# Patient Record
Sex: Male | Born: 1940 | Hispanic: No | Marital: Married | Smoking: Former smoker
Health system: Southern US, Community
[De-identification: ages and names within clinical notes are randomized; demographics above are authoritative.]

## PROBLEM LIST (undated history)

## (undated) DIAGNOSIS — I509 Heart failure, unspecified: Secondary | ICD-10-CM

## (undated) DIAGNOSIS — I10 Essential (primary) hypertension: Secondary | ICD-10-CM

## (undated) DIAGNOSIS — I639 Cerebral infarction, unspecified: Secondary | ICD-10-CM

## (undated) DIAGNOSIS — G4730 Sleep apnea, unspecified: Secondary | ICD-10-CM

## (undated) DIAGNOSIS — E785 Hyperlipidemia, unspecified: Secondary | ICD-10-CM

## (undated) DIAGNOSIS — F0390 Unspecified dementia without behavioral disturbance: Secondary | ICD-10-CM

## (undated) DIAGNOSIS — K219 Gastro-esophageal reflux disease without esophagitis: Secondary | ICD-10-CM

## (undated) DIAGNOSIS — J45909 Unspecified asthma, uncomplicated: Secondary | ICD-10-CM

## (undated) DIAGNOSIS — I2510 Atherosclerotic heart disease of native coronary artery without angina pectoris: Secondary | ICD-10-CM

## (undated) DIAGNOSIS — D649 Anemia, unspecified: Secondary | ICD-10-CM

## (undated) DIAGNOSIS — IMO0001 Reserved for inherently not codable concepts without codable children: Secondary | ICD-10-CM

## (undated) DIAGNOSIS — I499 Cardiac arrhythmia, unspecified: Secondary | ICD-10-CM

## (undated) DIAGNOSIS — E119 Type 2 diabetes mellitus without complications: Secondary | ICD-10-CM

## (undated) DIAGNOSIS — J449 Chronic obstructive pulmonary disease, unspecified: Secondary | ICD-10-CM

## (undated) DIAGNOSIS — E039 Hypothyroidism, unspecified: Secondary | ICD-10-CM

## (undated) DIAGNOSIS — E079 Disorder of thyroid, unspecified: Secondary | ICD-10-CM

## (undated) DIAGNOSIS — K922 Gastrointestinal hemorrhage, unspecified: Secondary | ICD-10-CM

## (undated) HISTORY — PX: EYE SURGERY: SHX253

## (undated) HISTORY — DX: Essential (primary) hypertension: I10

## (undated) HISTORY — DX: Unspecified dementia, unspecified severity, without behavioral disturbance, psychotic disturbance, mood disturbance, and anxiety: F03.90

## (undated) HISTORY — DX: Sleep apnea, unspecified: G47.30

## (undated) HISTORY — PX: OTHER SURGICAL HISTORY: SHX169

## (undated) HISTORY — PX: CARDIAC CATHETERIZATION: SHX172

---

## 2005-09-16 ENCOUNTER — Other Ambulatory Visit: Payer: Self-pay

## 2005-09-16 ENCOUNTER — Emergency Department: Payer: Self-pay

## 2007-02-07 ENCOUNTER — Ambulatory Visit: Payer: Self-pay | Admitting: Cardiovascular Disease

## 2008-07-24 ENCOUNTER — Other Ambulatory Visit: Payer: Self-pay | Admitting: Ophthalmology

## 2009-08-12 ENCOUNTER — Emergency Department: Payer: Self-pay | Admitting: Emergency Medicine

## 2010-03-10 DIAGNOSIS — H11009 Unspecified pterygium of unspecified eye: Secondary | ICD-10-CM | POA: Insufficient documentation

## 2010-03-10 DIAGNOSIS — H40059 Ocular hypertension, unspecified eye: Secondary | ICD-10-CM | POA: Insufficient documentation

## 2010-03-10 DIAGNOSIS — M3501 Sicca syndrome with keratoconjunctivitis: Secondary | ICD-10-CM | POA: Insufficient documentation

## 2010-03-10 DIAGNOSIS — Z961 Presence of intraocular lens: Secondary | ICD-10-CM | POA: Insufficient documentation

## 2010-06-07 DIAGNOSIS — H04129 Dry eye syndrome of unspecified lacrimal gland: Secondary | ICD-10-CM | POA: Insufficient documentation

## 2010-08-11 DIAGNOSIS — J310 Chronic rhinitis: Secondary | ICD-10-CM | POA: Insufficient documentation

## 2010-08-11 DIAGNOSIS — Z8739 Personal history of other diseases of the musculoskeletal system and connective tissue: Secondary | ICD-10-CM | POA: Insufficient documentation

## 2010-08-11 DIAGNOSIS — I2510 Atherosclerotic heart disease of native coronary artery without angina pectoris: Secondary | ICD-10-CM | POA: Insufficient documentation

## 2010-08-11 DIAGNOSIS — K603 Anal fistula: Secondary | ICD-10-CM | POA: Insufficient documentation

## 2010-08-11 DIAGNOSIS — K219 Gastro-esophageal reflux disease without esophagitis: Secondary | ICD-10-CM | POA: Insufficient documentation

## 2010-08-11 DIAGNOSIS — E119 Type 2 diabetes mellitus without complications: Secondary | ICD-10-CM

## 2011-12-12 ENCOUNTER — Ambulatory Visit: Payer: Self-pay | Admitting: Internal Medicine

## 2012-02-20 ENCOUNTER — Ambulatory Visit: Payer: Self-pay | Admitting: Gastroenterology

## 2012-03-27 ENCOUNTER — Ambulatory Visit: Payer: Self-pay | Admitting: Cardiovascular Disease

## 2012-07-06 ENCOUNTER — Ambulatory Visit: Payer: Self-pay | Admitting: Internal Medicine

## 2012-07-11 ENCOUNTER — Ambulatory Visit: Payer: Self-pay | Admitting: Internal Medicine

## 2012-09-20 ENCOUNTER — Ambulatory Visit: Payer: Self-pay | Admitting: Otolaryngology

## 2013-09-19 DIAGNOSIS — F17200 Nicotine dependence, unspecified, uncomplicated: Secondary | ICD-10-CM | POA: Insufficient documentation

## 2014-05-06 ENCOUNTER — Inpatient Hospital Stay
Admit: 2014-05-06 | Disposition: A | Discharge status: Home / Self Care | Payer: Self-pay | Attending: Internal Medicine | Admitting: Internal Medicine

## 2014-05-06 LAB — CK TOTAL AND CKMB (NOT AT ARMC)
CK, Total: 645 U/L — ABNORMAL HIGH
CK-MB: 19.9 ng/mL — ABNORMAL HIGH

## 2014-05-06 LAB — PROTIME-INR
INR: 1.4
Prothrombin Time: 17.3 secs — ABNORMAL HIGH

## 2014-05-06 LAB — COMPREHENSIVE METABOLIC PANEL
AST: 59 U/L — AB
Albumin: 4 g/dL
Alkaline Phosphatase: 57 U/L
Anion Gap: 10 (ref 7–16)
BUN: 25 mg/dL — ABNORMAL HIGH
Bilirubin,Total: 0.7 mg/dL
CHLORIDE: 90 mmol/L — AB
CO2: 29 mmol/L
Calcium, Total: 9.2 mg/dL
Creatinine: 1.3 mg/dL — ABNORMAL HIGH
EGFR (African American): >60
GFR CALC NON AF AMER: 53 — AB
Glucose: 67 mg/dL
Potassium: 3.7 mmol/L
SGPT (ALT): 31 U/L
Sodium: 129 mmol/L — ABNORMAL LOW
Total Protein: 7.6 g/dL

## 2014-05-06 LAB — URINALYSIS, COMPLETE
BILIRUBIN, UR: NEGATIVE
Bacteria: NONE SEEN
Glucose,UR: NEGATIVE mg/dL (ref 0–75)
Ketone: NEGATIVE
LEUKOCYTE ESTERASE: NEGATIVE
NITRITE: NEGATIVE
PH: 7 (ref 4.5–8.0)
PROTEIN: NEGATIVE
SQUAMOUS EPITHELIAL: NONE SEEN
Specific Gravity: 1.004 (ref 1.003–1.030)
WBC UR: NONE SEEN /HPF (ref 0–5)

## 2014-05-06 LAB — CBC
HCT: 26.4 % — ABNORMAL LOW (ref 40.0–52.0)
HGB: 8.4 g/dL — ABNORMAL LOW (ref 13.0–18.0)
MCH: 23.3 pg — AB (ref 26.0–34.0)
MCHC: 31.7 g/dL — AB (ref 32.0–36.0)
MCV: 73 fL — ABNORMAL LOW (ref 80–100)
PLATELETS: 236 10*3/uL (ref 150–440)
RBC: 3.6 10*6/uL — AB (ref 4.40–5.90)
RDW: 16.5 % — ABNORMAL HIGH (ref 11.5–14.5)
WBC: 9.9 10*3/uL (ref 3.8–10.6)

## 2014-05-06 LAB — APTT: ACTIVATED PTT: 38.5 s — AB (ref 23.6–35.9)

## 2014-05-06 LAB — AMMONIA: Ammonia, Plasma: 10 umol/L

## 2014-05-06 LAB — FERRITIN: Ferritin (ARMC): 9 ng/mL — ABNORMAL LOW

## 2014-05-06 LAB — PRO B NATRIURETIC PEPTIDE: B-TYPE NATIURETIC PEPTID: 493 pg/mL — AB

## 2014-05-06 LAB — IRON AND TIBC
IRON BIND. CAP.(TOTAL): 509 — AB (ref 250–450)
IRON: 14 ug/dL — AB
Iron Saturation: 2.7
Unbound Iron-Bind.Cap.: 495.5

## 2014-05-06 LAB — TROPONIN I: TROPONIN-I: 0.03 ng/mL

## 2014-05-06 LAB — LACTIC ACID, PLASMA: Lactic Acid, Venous: 1.5 mmol/L

## 2014-05-06 LAB — CK-MB: CK-MB: 16.7 ng/mL — AB

## 2014-05-07 LAB — CBC WITH DIFFERENTIAL/PLATELET
BASOS ABS: 0 10*3/uL (ref 0.0–0.1)
Basophil %: 0.1 %
EOS ABS: 0 10*3/uL (ref 0.0–0.7)
Eosinophil %: 0 %
HCT: 26.2 % — AB (ref 40.0–52.0)
HGB: 8.5 g/dL — ABNORMAL LOW (ref 13.0–18.0)
LYMPHS PCT: 8.2 %
Lymphocyte #: 0.5 10*3/uL — ABNORMAL LOW (ref 1.0–3.6)
MCH: 23.8 pg — ABNORMAL LOW (ref 26.0–34.0)
MCHC: 32.5 g/dL (ref 32.0–36.0)
MCV: 73 fL — ABNORMAL LOW (ref 80–100)
Monocyte #: 0.7 x10 3/mm (ref 0.2–1.0)
Monocyte %: 11.7 %
NEUTROS ABS: 4.5 10*3/uL (ref 1.4–6.5)
Neutrophil %: 80 %
Platelet: 236 10*3/uL (ref 150–440)
RBC: 3.58 10*6/uL — ABNORMAL LOW (ref 4.40–5.90)
RDW: 16.7 % — AB (ref 11.5–14.5)
WBC: 5.6 10*3/uL (ref 3.8–10.6)

## 2014-05-07 LAB — BASIC METABOLIC PANEL
Anion Gap: 10 (ref 7–16)
BUN: 22 mg/dL — ABNORMAL HIGH
CALCIUM: 8.7 mg/dL — AB
Chloride: 92 mmol/L — ABNORMAL LOW
Co2: 27 mmol/L
Creatinine: 1.24 mg/dL
EGFR (African American): >60
GFR CALC NON AF AMER: 57 — AB
GLUCOSE: 131 mg/dL — AB
Potassium: 3.9 mmol/L
SODIUM: 129 mmol/L — AB

## 2014-05-07 LAB — TROPONIN I
Troponin-I: 0.04 ng/mL — ABNORMAL HIGH
Troponin-I: 0.03 ng/mL

## 2014-05-07 LAB — CK-MB: CK-MB: 11.9 ng/mL — AB

## 2014-05-08 LAB — BASIC METABOLIC PANEL
Anion Gap: 10 (ref 7–16)
BUN: 35 mg/dL — ABNORMAL HIGH
CALCIUM: 8.5 mg/dL — AB
CHLORIDE: 90 mmol/L — AB
CO2: 29 mmol/L
CREATININE: 1.6 mg/dL — AB
EGFR (African American): 48 — ABNORMAL LOW
EGFR (Non-African Amer.): 42 — ABNORMAL LOW
Glucose: 95 mg/dL
POTASSIUM: 3.8 mmol/L
Sodium: 129 mmol/L — ABNORMAL LOW

## 2014-05-08 LAB — IRON AND TIBC
IRON BIND. CAP.(TOTAL): 474 — AB (ref 250–450)
Iron Saturation: 3.6
Iron: 17 ug/dL — ABNORMAL LOW
UNBOUND IRON-BIND. CAP.: 457.5

## 2014-05-08 LAB — FERRITIN: Ferritin (ARMC): 8 ng/mL — ABNORMAL LOW

## 2014-05-09 LAB — BASIC METABOLIC PANEL
Anion Gap: 5 — ABNORMAL LOW (ref 7–16)
BUN: 40 mg/dL — AB
CREATININE: 1.68 mg/dL — AB
Calcium, Total: 8.3 mg/dL — ABNORMAL LOW
Chloride: 90 mmol/L — ABNORMAL LOW
Co2: 30 mmol/L
GFR CALC AF AMER: 45 — AB
GFR CALC NON AF AMER: 39 — AB
Glucose: 111 mg/dL — ABNORMAL HIGH
Potassium: 4 mmol/L
Sodium: 125 mmol/L — ABNORMAL LOW

## 2014-05-09 LAB — HEMOGLOBIN: HGB: 8.7 g/dL — ABNORMAL LOW (ref 13.0–18.0)

## 2014-05-10 LAB — BASIC METABOLIC PANEL
ANION GAP: 6 — AB (ref 7–16)
BUN: 39 mg/dL — ABNORMAL HIGH
CALCIUM: 8 mg/dL — AB
CHLORIDE: 92 mmol/L — AB
CO2: 29 mmol/L
Creatinine: 1.56 mg/dL — ABNORMAL HIGH
EGFR (Non-African Amer.): 43 — ABNORMAL LOW
GFR CALC AF AMER: 50 — AB
GLUCOSE: 113 mg/dL — AB
Potassium: 4.2 mmol/L
SODIUM: 127 mmol/L — AB

## 2014-05-11 LAB — BASIC METABOLIC PANEL
Anion Gap: 5 — ABNORMAL LOW (ref 7–16)
BUN: 36 mg/dL — AB
CO2: 31 mmol/L
Calcium, Total: 8 mg/dL — ABNORMAL LOW
Chloride: 96 mmol/L — ABNORMAL LOW
GLUCOSE: 125 mg/dL — AB
Potassium: 3.8 mmol/L
Sodium: 132 mmol/L — ABNORMAL LOW

## 2014-05-11 LAB — HEMOGLOBIN: HGB: 8.5 g/dL — ABNORMAL LOW (ref 13.0–18.0)

## 2014-05-11 LAB — CULTURE, BLOOD (SINGLE)

## 2014-05-11 LAB — CREATININE, SERUM
Creatinine: 1.39 mg/dL — ABNORMAL HIGH
EGFR (Non-African Amer.): 49 — ABNORMAL LOW
GFR CALC AF AMER: 57 — AB

## 2014-05-13 ENCOUNTER — Ambulatory Visit: Admit: 2014-05-13 | Disposition: A | Discharge status: Home / Self Care | Payer: Self-pay | Admitting: Internal Medicine

## 2014-05-25 NOTE — Discharge Summary (Addendum)
PATIENT NAME:  Derek Shelton, SCHNORR MR#:  [id number] DATE OF BIRTH:  Jun 06, 1940  DATE OF ADMISSION:  05/06/2014 DATE OF DISCHARGE:  05/12/2014  PRIMARY CARE PHYSICIAN:  Lamonte Sakai, MD   DISCHARGE DIAGNOSES:   1.  Altered mental status secondary to metabolic encephalopathy.  2.  Pneumonia. 3.  Myoclonic jerks secondary to inadequate sleep.  4.  Obstructive sleep apnea.  5.  Type 2 diabetes mellitus.  6.  Constipation.  7.  Acute respiratory failure due to pneumonia.  8.  Acute on chronic diastolic heart failure.  9.  Hyponatremia due to congestive heart failure.  10.  Acute renal failure secondary to acute tubular necrosis, resolved.  11. Paroxysmal atrial fibrillation.   DISCHARGE MEDICATIONS:  1.  Avodart 0.5 mg p.o. daily. 2.  Cymbalta 60 mg p.o. daily. 3.  Eliquis 5 mg p.o. b.i.d. and then change to 2.5 mg b.i.d.  4.  Amaryl 2 mg p.o. daily. 5.  Lovaza 1 gram 2 capsules p.o. b.i.d.    6.  MiraLax as needed for constipation.  7.  Nexium 40 mg p.o. daily.  8.  Levothyroxine 50 mcg p.o. daily.   9.  Meloxicam 15 mg p.o. daily.  10. Amiodarone 200 mg p.o. daily.  11.  Lasix 20 mg p.o. daily  12.  Coreg 3.125 mg p.o. daily.  13.  Hydralazine 100 mg every 8 hours.  14.  Benazepril 10 mg p.o. daily.  15.  Trazodone 100 mg p.o. at bedtime.  16.  Symbicort 5/200 mcg 2 puffs b.i.d. He was given [facility] but that was not approved so we gave Symbicort instead.  18.  Prednisone 20 mg  2 tablets p.o. b.i.d. and 1 tablet p.o. b.i.d. and then stop.  19.  Melatonin 3 mg p.o. at bedtime.  20.  Xanax 0.5 mg p.o. at bedtime as needed for insomnia.   21.  Spiriva 18 mcg inhalation daily.    Advised to stop metformin until he gets followup with his primary doctor regarding his kidney function. We also stopped Benadryl because of episodes of confusion and jerks.   CONSULTATIONS:  1.  Cardiology consult with Dionisio David, MD.   2.  Urology consult with Mila Homer. Tamala Julian, MD. 3.  Pulmonary  consult with Mariane Duval, [place]:   1.  This is a 74 year old male patient, followed by Dr. Lamonte Sakai, brought in because of altered mental status  with .  The patient has been having symptoms of shortness of breath, pedal edema, and confusion for about 3 to 4 weeks. The patient went to see Dr. Lamonte Sakai where he was found to be confused with myoclonic jerks, so the patient was sent into the Emergency Room for further evaluation. The patient was found to have a left upper lobe pneumonia and hyponatremia with sodium 129 and creatinine of 1.3 on admission. The patient had an EKG with atrial fibrillation with controlled rate of 60 beats per minute. Admitted to the hospitalist service for altered mental status secondary to infection and also possible poor sleep. The patient was started on Levaquin and vancomycin.  His CT head did not show any acute changes. The patient was initially admitted to the ICU, started on BiPAP, then changed from BiPAP to oxygen via nasal cannula. The patient's blood cultures did not show any growth. The patient finished a course of 7 days of antibiotics, so he did not qualify for any further doses. The patient's main problem is he has sleep  apnea and he has a BMI of 34.3. He was given sleep apnea machine by Dr. Lamonte Sakai, but he returned it to the company because he could not tolerate it and he was not wearing CPAP machine like the way he was supposed to and he has been having trouble sleeping for like months and had this confusional episode, so Dr. Valora Corporal saw the patient and he recommended starting him on melatonin and also some trazodone and he did not think he needs any workup.  He suggested he needs CPAP machine and treatment for sleep apnea that can take care of his (myoklonic jerks.  The patient does not have CPAP machine at home and the patient was discharged with CPAP the following day and we scheduled sleep studies the next 2 days so that he can be  tested for CPAP.  Because the patient did sleep studies in the last 1 year for him to qualify for CPAP at home. The patient was given a trial of CPAP machine at night.  He tolerated it very well and he was mainly alert and oriented the following day.  The patient's confusion also resolved.   2.  Acute on chronic diastolic heart failure.  The patient's ejection fraction more than 60% and he thought to have congestive heart flare with hyponatremia.  He was given IV Lasix and his sodium nicely improved and the patient was seen by Dr. Neoma Laming. The patient's sodium improved and it was as low as 125 and with IV Lasix it improved to 132.  The patient was discharged home with p.o. Lasix.  3.  Proximal atrial fibrillation.  He is rate controlled.  He is on amiodarone and just a small dose of Coreg and Eliquis.    4.  Chronic obstructive pulmonary disease.  He chews tobacco and he has been given prescription for a tapering course of prednisone and he is on Symbicort.  We encourage him to continue and advised him to quit chewing tobacco.  5.  Diabetes mellitus type 2.  We gave him Amaryl, but metformin was stopped because of renal failure.  The patient's son is a physician, d/w him.so we advised him that metformin can be restarted after he gets his labs done by Dr. Lamonte Sakai.   6.  Hypothyroidism.  Continue on Synthroid at 50 mcg p.o. daily.   7.  Anxiety.  Patient did well with small dose of Xanax 0.5 mg at bedtime.  (gave prescription.> both the patient and patient's son but he needs  regular monitoring of this and that can be done with his primary doctor. 8.   Regarding acute on chronic diastolic failure, he is on aspirin, beta blockers, ACE inhibitors and Lasix.  9.  The main problem in his obesity and also sleep apnea.  We could not discharge him until the CPAP is set up, so the patient finally got CPAP authorized, so will deliver the CPAP machine to home, but wants home health and the patient will be  discharged home.  The patient had a followup appointment at the sleep center on April 19, sleep study to see if he can be fitted for CPAP machine.     DISCHARGE VITAL SIGNS:  Temperature 98 degrees Fahrenheit, heart rate 52, blood pressure 122/60, 1 tablet 97% on room air.   PHYSICAL EXAMINATION:  CARDIOVASCULAR:  S1 and S2 regular.  LUNGS:  Clear to auscultation.  ABDOMEN:  Soft, nontender, nondistended.  Bowel sounds present.   NEUROLOGICAL:  The  patient was oriented to time, place, person.  We discussed this whole plan. Told us he will stay with his son who is a Careers information officer and also understand that he stays in the town.  The patient's family understands all of the diagnoses and all the treatment plans.  TIME SPENT:  More than 30 minutes.    ____________________________ Epifanio Lesches, MD [id number] D: 05/17/2014 08:14:00 ET T: 05/17/2014 15:08:11 ET JOB#: [id number]  cc: Dionisio David, MD Perrin Maltese, MD      Epifanio Lesches MD ELECTRONICALLY SIGNED 05/27/2014 13:08

## 2014-05-25 NOTE — Discharge Summary (Signed)
PATIENT NAME:  Derek Shelton, Derek Shelton MR#:  [id number] DATE OF BIRTH:  [birth year]  DATE OF ADMISSION:  05/13/2014 DATE OF DISCHARGE:  05/13/2014  PRIMARY CARE PHYSICIAN:  Lamonte Sakai, MD   DISCHARGE DIAGNOSES:   1.  Altered mental status secondary to metabolic encephalopathy.  2.  Pneumonia. 3.  (Dictation Anomaly) <<MISSING TEXT>>  secondary to (Dictation Anomaly) <<MISSING TEXT>> .  4.  Obstructive sleep apnea.  5.  Type 2 diabetes mellitus.  6.  Constipation.  7.  Acute respiratory failure due to pneumonia.  8.  Acute on chronic diastolic heart failure.  9.  Hyponatremia due to congestive heart failure.  10.  Acute renal failure secondary to acute tubular necrosis, resolved.  11. Paroxysmal atrial fibrillation.   DISCHARGE MEDICATIONS:  1.  Avodart 0.5 mg p.o. daily. 2.  Cymbalta 60 mg p.o. daily. 3.  Eliquis 5 mg p.o. b.i.d. and then change to 2.5 mg b.i.d.  4.  Amaryl 2 mg p.o. daily. 5.  Lovaza 1 gram 2 capsules p.o. b.i.d.    6.  MiraLax as needed for constipation.  7.  Nexium 40 mg p.o. daily.  8.  Levothyroxine 50 mcg p.o. daily.   9.  (Dictation Anomaly) <<MISSING TEXT>> p.o. daily.  10. Amiodarone 200 mg p.o. daily.  11.  Lasix 20 mg p.o. daily  12.  Coreg 3.125 mg p.o. daily.  13.  Hydralazine 100 mg every 8 hours.  14.  Benazepril 10 mg p.o. daily.  15.  Trazodone (Dictation Anomaly)<<MISSING TEXT>>  at bedtime.  16.  Symbicort 5/200 mcg 2 puffs b.i.d. He was given [facility] but that was not approved so we gave Symbicort instead.  18.  Prednisone 20 mg  2 tablets p.o. b.i.d. and 1 tablet p.o. b.i.d. and then stop.  19.  Melatonin 3 mg p.o. at bedtime.  20.  Xanax 0.5 mg p.o. at bedtime as needed for insomnia.   21.  Spiriva 18 mcg inhalation daily.    Advised to stop metformin until he gets followup with his primary doctor regarding his kidney function. We also stopped Benadryl because of episodes of confusion and jerks.   CONSULTATIONS:  1.  Cardiology consult with  Dionisio David, MD.   2.  Urology consult with Mila Homer. Tamala Julian, MD. 3.  Pulmonary consult with Mariane Duval, [place]:   1.  This is a 74 year old male patient, followed by Dr. Lamonte Sakai, brought in because of altered mental status (Dictation Anomaly) <<MISSING TEXT>>.  The patient has been having symptoms of shortness of breath, pedal edema, and confusion for about 3 to 4 weeks. The patient went to see Dr. Lamonte Sakai where he was found to be confused with myoclonic jerks, so the patient was sent into the Emergency Room for further evaluation. The patient was found to have a left upper lobe pneumonia and hyponatremia with sodium 129 and creatinine of 1.3 on admission. The patient had an EKG with atrial fibrillation with controlled rate of 60 beats per minute. Admitted to the hospitalist service for altered mental status secondary to infection and also possible poor sleep. The patient was started on Levaquin and vancomycin.  His CT head did not show any acute changes. The patient was initially admitted to the ICU, started on BiPAP, then changed from BiPAP to oxygen via nasal cannula. The patient's blood cultures did not show any growth. The patient finished a course of 7 days of antibiotics, so he did not qualify for any further  doses. The patients main problem is he has sleep apnea and he has a BMI of 34.3. He was given sleep apnea machine by Dr. Lamonte Sakai, but he returned it to the company because he could not tolerate it and he was not wearing CPAP machine like the way he was supposed to and he has been having trouble sleeping for like months and had this confusional episode, so Dr. (Dictation Anomaly)<<MISSING TEXT>> saw the patient and he recommended starting him on melatonin and also some trazodone and he did not think he needs any workup.  He suggested he needs CPAP machine and treatment for sleep apnea that can take care of his (Dictation Anomaly)<<MISSING TEXT>>.  The patient does  not have CPAP machine at home and the patient was discharged with CPAP the following day and we scheduled sleep studies the next 2 days so that he can be tested for CPAP.  Because the patient did sleep studies in the last 1 year for him to qualify for CPAP at home. The patient was given a trial of CPAP machine at night.  He tolerated it very well and he was mainly alert and oriented the following day.  The patient's confusion also resolved.   2.  Acute on chronic diastolic heart failure.  The patients ejection fraction more than 60% and he thought to have congestive heart flare with hyponatremia.  He was given IV Lasix and his sodium nicely improved and the patient was seen by Dr. Neoma Laming. The patient's sodium improved and it was as low as 125 and with IV Lasix it improved to 132.  The patient was discharged home with p.o. Lasix.  3.  Proximal atrial fibrillation.  He is rate controlled.  He is on amiodarone and just a small dose of Coreg and Eliquis.    4.  Chronic obstructive pulmonary disease.  He chews tobacco and he has been given prescription for a tapering course of prednisone and he is on Symbicort.  We encourage him to continue and advised him to quit chewing tobacco.  5.  Diabetes mellitus type 2.  We gave him Amaryl, but metformin was stopped because of renal failure.  The patients son is a physician, (Dictation Anomaly)<<MISSING TEXT>>  physician, so we advised him that metformin can be restarted after he gets his labs done by Dr. Lamonte Sakai.   6.  Hypothyroidism.  Continue on Synthroid at 50 mcg p.o. daily.   7.  Anxiety.  Patient did well with small dose of Xanax 0.5 mg at bedtime.  (Dictation Anomaly)<<MISSING TEXT>> both the patient and patient's son but he missed regular monitoring of this and that can be done with his primary doctor. 8.   Regarding acute on chronic diastolic failure, he is on aspirin, beta blockers, ACE inhibitors and Lasix.  9.  The main problem in his obesity and  also sleep apnea.  We could not discharge him until the CPAP is set up, so the patient finally got CPAP authorized, so will deliver the CPAP machine to home, but wants home health and the patient will be discharged home.  The patient had a followup appointment at the sleep center on April 19, sleep study to see if he can be fitted for CPAP machine.     DISCHARGE VITAL SIGNS:  Temperature 98 degrees Fahrenheit, heart rate 52, blood pressure 122/60, 1 tablet 97% on room air.   PHYSICAL EXAMINATION:  CARDIOVASCULAR:  S1 and S2 regular.  LUNGS:  Clear to auscultation.  ABDOMEN:  Soft, nontender, nondistended.  Bowel sounds present.   NEUROLOGICAL:  The patient was oriented to time, place, person.  We discussed this whole plan. Told us he will stay with his son who is a Careers information officer and also understand that he stays in the town.  The patients family understands all of the diagnoses and all the treatment plans.  TIME SPENT:  More than 30 minutes.     ____________________________ Epifanio Lesches, MD [id number] D: 05/17/2014 08:14:00 ET T: 05/17/2014 15:08:11 ET JOB#: [id number]  cc: Dionisio David, MD Perrin Maltese, MD Epifanio Lesches, MD, <Dictator>

## 2014-05-25 NOTE — Consult Note (Signed)
PATIENT NAME:  Derek, Shelton MR#:  [id number] DATE OF BIRTH:  07/06/1940  DATE OF CONSULTATION:  05/07/2014  REFERRING PHYSICIAN:   CONSULTING PHYSICIAN:  Kelby Fam. Myanna Ziesmer, PA-C  INDICATION FOR CONSULT:  Shortness of breath and congestive heart failure.   HISTORY OF PRESENT ILLNESS: This is a 74 year old male, well known to our practice with a past medical history obstructive sleep apnea, atrial fibrillation, diabetes, hypertension who was seen yesterday with patient's PCP, Dr. Lamonte Sakai, secondary to worsening shortness of breath and the patient being unable to sleep. The patient was seen by cardiology last week for same reasons, shortness of breath and difficulty sleeping, and the patient's family strongly thought that these side effects started when carvedilol was added on for blood pressure control. The patient has no history of systolic CHF but does have history of grade 2 diastolic CHF. The patient has been counseled many times of importance of wearing  CPAP machine and continues to refuse.   PAST MEDICAL HISTORY: Obstructive sleep apnea, atrial fibrillation, hypertension, and diabetes mellitus.  ALLERGIES: PENICILLIN.    CURRENT HOME MEDICATIONS: Amiodarone 200 mg daily, Eliquis 5 mg b.i.d., benazepril 40 mg daily, Lasix  20 mg daily, hydralazine 100 mg b.i.d., levothyroxine 50 mcg daily, Nexium 40 mg daily.   SOCIAL HISTORY: No alcohol abuse. No history of smoking cigarettes.   FAMILY HISTORY: Diabetes.   REVIEW OF SYSTEMS: CONSTITUTIONAL: Patient feeling much better. No fatigue or malaise.  RESPIRATORY: Shortness of breath improved, wheezing improved.  CARDIOVASCULAR: No chest pain, pressure, tightness or palpitations.  GASTROINTESTINAL: No abdominal pain, nausea, vomiting, or diarrhea.  PHYSICAL EXAMINATION:  VITAL SIGNS: Temperature 98.9, pulse 54, blood pressure 122/56, pulse oximetry 98% saturation on 2 liters.   PERTINENT LABORATORY DATA:  BNP is 493. Creatinine 1.3,  1.24.  Sodium 129.   Troponin 0.03, 0.04, 0.03. WBC 9.9, 5.6. EKG shows normal sinus rhythm, 60 beats per minute, 1st degree AV block, right bundle branch block. Echo done in office 03/30/2014 shows EF of 37%, grade 2 diastolic dysfunction, mild to moderate MR and mild AR.   ASSESSMENT AND PLAN: 1. Acute on chronic diastolic congestive heart failure: Likely exacerbated by patient's concurrent pneumonia. Agree with continuing antibiotics. We will check echocardiogram for most recent left ventricular ejection fraction and wall motion. Also advised increasing hydralazine to 100 mg t.i.d. for tighter blood pressure control.  2. Atrial fibrillation. Advised continuation of amiodarone and Eliquis. The patient will likely stay in sinus rhythm as long as he is wearing CPAP.  Importance of wearing CPAP emphasized to patient and patient's wife. He will need this machine at discharge as he sent his CPAP machine from home back to the CPAP machine  company a long time ago. We will continue to follow.   Thank you very much for this consult    ____________________________ Kelby Fam. [place] ear:tr D: 05/07/2014 13:27:38 ET T: 05/07/2014 13:54:06 ET JOB#: [id number]  cc: Dyann Ruddle A. [place], <Dictator> Kelby Fam Kashif Pooler PA ELECTRONICALLY SIGNED 05/23/2014 15:02

## 2014-05-25 NOTE — Consult Note (Signed)
Referring Physician:  Gladstone Lighter :   Primary Care Physician:  Tammy S[facility] [place], [facility], [address] [place],  [address], [phone]  Reason for Consult: Admit Date: 07-May-2014  Chief Complaint: insomnia  Reason for Consult: insomnia   History of Present Illness: History of Present Illness:   seen at request of Dr. Humphrey Rolls for insomnia;  74 yo RHD M presents to [facility] secondary to continued insomnia and diffuse pains all over.  There was mention of some pain all over but pt notes only eye and throat pain.  There were apparently some jerks but pt denies this as does his son in the room.    ROS:  General fatigue   HEENT no complaints   Lungs cough   Cardiac no complaints   GI no complaints   GU no complaints   Musculoskeletal no complaints   Extremities no complaints   Skin no complaints   Neuro headache   Past Medical/Surgical Hx:  Sleep Apnea:   atrial fib:   arthritis:   diabetic:   hypertension:   Past Medical/ Surgical Hx:  Past Medical History reviewed as above   Past Surgical History reviewed as above   Home Medications: Medication Instructions Last Modified Date/Time  Avodart 0.5 mg oral capsule 1 cap(s) orally once a day 12-Apr-16 16:07  Cymbalta 60 mg oral delayed release capsule 1 cap(s) orally once a day 12-Apr-16 16:07  Eliquis 5 mg oral tablet 1 tab(s) orally 2 times a day 12-Apr-16 16:07  glimepiride 2 mg oral tablet 1 tab(s) orally once a day 12-Apr-16 16:07  Lovaza ethyl esters 1000 mg oral capsule 2 cap(s) orally 2 times a day 12-Apr-16 16:07  metformin 1000 mg oral tablet 1 tab(s) orally 2 times a day 12-Apr-16 16:07  MiraLax - oral powder for reconstitution  orally once a day 12-Apr-16 16:05  Nexium 40 mg oral delayed release capsule 1 cap(s) orally once a day 12-Apr-16 16:07  amiodarone 200 mg oral tablet 1 tab(s) orally once a day 12-Apr-16 16:07  hydrALAZINE 100 mg  oral tablet 1 tab(s) orally 2 times a day 12-Apr-16 16:07  furosemide 20 mg oral tablet 1 tab(s) orally once a day 12-Apr-16 16:07  levothyroxine 50 mcg (0.05 mg) oral tablet 1 tab(s) orally once a day 12-Apr-16 16:07  MethylPREDNISolone Dose Pack 4 mg oral tablet  orally  12-Apr-16 16:04  Benadryl 25 mg oral capsule 1 cap(s) orally 3 times a day 12-Apr-16 16:07  benazepril 40 mg oral tablet 1 tab(s) orally once a day 12-Apr-16 16:07  meloxicam 15 mg oral tablet 1 tab(s) orally once a day 12-Apr-16 16:07  carvedilol 6.25 mg oral tablet 1 tab(s) orally 2 times a day 12-Apr-16 16:07   Allergies:  PCN: Unknown  Allergies:  Allergies PCN   Social/Family History: Employment Status: unemployed  Lives With: children  Living Arrangements: house  Social History: no tob, no EtOH, no illicits  Family History: no seizures, no stroke   Vital Signs: **Vital Signs.:   15-Apr-16 06:02  Vital Signs Type Routine  Temperature Temperature (F) 97.5  Celsius 36.3  Pulse Pulse 57  Respirations Respirations 26  Systolic BP Systolic BP 427  Diastolic BP (mmHg) Diastolic BP (mmHg) 75  Mean BP 106  Pulse Ox % Pulse Ox % 98  Pulse Ox Activity Level  At rest  Oxygen Delivery Room Air/ 21 %   Physical Exam: General: overweight, NAD  HEENT: normocephalic, sclera nonicteric, oropharynx clear  Neck: supple, no JVD, no bruits  Chest: moderate wheezing, cough, good movement  Cardiac: RRR, no murmurs, no edema, 2+ pulses  Extremities: no C/C/E, FROM   Neurologic Exam: Mental Status: sleepy but oriented x 3, nl speech and language  Cranial Nerves: PERRLA, EOMI, nl VF, face symmetric, tongue midline, shoulder shrug equal  Motor Exam: 5/5 B normal, tone, mild myoclonus  Deep Tendon Reflexes: 1+/4 B, plantars downgoing B, no Hoffman  Sensory Exam: pinprick, temperature, and vibration intact B   Lab Results: LabObservation:  13-Apr-16 18:05   OBSERVATION Reason for Test  Hepatic:  12-Apr-16 14:10    Bilirubin, Total 0.7 (0.3-1.2 NOTE: New Reference Range  04/01/14)  Alkaline Phosphatase 57 (38-126 NOTE: New Reference Range  04/01/14)  SGPT (ALT) 31 (17-63 NOTE: New Reference Range  04/01/14)  SGOT (AST)  59 (15-41 NOTE: New Reference Range  04/01/14)  Total Protein, Serum 7.6 (6.5-8.1 NOTE: New Reference Range  04/01/14)  Albumin, Serum 4.0 (3.5-5.0 NOTE: New reference range  04/01/14)  Routine Micro:  12-Apr-16 14:35   Micro Text Report BLOOD CULTURE   COMMENT                   NO GROWTH IN 48 HOURS   ANTIBIOTIC                       Culture Comment NO GROWTH IN 48 HOURS  Result(s) reported on 08 May 2014 at 02:00PM.  Routine Chem:  12-Apr-16 14:10   B-Type Natriuretic Peptide ([facility])  493 (0-99 NOTE: New Reference Range:  04/01/14)  Lactic Acid  Venous 1.5 (0.5-2.0 NOTE: New Reference Range:  04/01/14)    15:00   Ammonia, Plasma 10 (9-35 NOTE: New Reference Range  04/01/14)    22:20   Result Comment - TROPONIN CALLED TO  - CARLA QUINCER @ 0104  - 05-07-14 BY AJO  - READ-BACK PERFORMED  Result(s) reported on 07 May 2014 at 01:09AM.  14-Apr-16 05:18   Iron Binding Capacity (TIBC) 474  Unbound Iron Binding Capacity 457.5  Iron, Serum  17 (45-182 NOTE: New Reference Range:  04/01/14)  Iron Saturation 3.6 (Result(s) reported on 08 May 2014 at 12:44PM.)  BUN  35 (6-20 NOTE: New Reference Range  04/01/14)  Creatinine (comp)  1.60 (0.61-1.24 NOTE: New Reference Range  04/01/14)  eGFR (African American)  48  eGFR (Non-African American)  42 (eGFR values <68m/min/1.73 m2 may be an indication of chronic kidney disease (CKD). Calculated eGFR is useful in patients with stable renal function. The eGFR calculation will not be reliable in acutely ill patients when serum creatinine is changing rapidly. It is not useful in patients on dialysis. The eGFR calculation may not be applicable to patients at the low and high extremes of body sizes, pregnant women, and  vegetarians.)  15-Apr-16 04:46   Glucose, Serum  111 (65-99 NOTE: New Reference Range  04/01/14)  Sodium, Serum  125 (135-145 NOTE: New Reference Range  04/01/14)  Potassium, Serum 4.0 (3.5-5.1 NOTE: New Reference Range  04/01/14)  Chloride, Serum  90 (101-111 NOTE: New Reference Range  04/01/14)  CO2, Serum 30 (22-32 NOTE: New Reference Range  04/01/14)  Calcium (Total), Serum  8.3 (8.9-10.3 NOTE: New Reference Range  04/01/14)  Anion Gap  5 (Result(s) reported on 09 May 2014 at 0[facility])  Cardiac:  12-Apr-16 14:10   CK, Total  645 (49-397 NOTE: New Reference Range  04/01/14)  13-Apr-16 01:55   Troponin I 0.03 (  0.00-0.03 0.03 ng/mL or less: NEGATIVE  Repeat testing in 3-6 hrs  if clinically indicated. >0.05 ng/mL: POTENTIAL  MYOCARDIAL INJURY. Repeat  testing in 3-6 hrs if  clinically indicated. NOTE: An increase or decrease  of 30% or more on serial  testing suggests a  clinically important change NOTE: New Reference Range  04/01/14)    06:11   CPK-MB, Serum  11.9 (0.5-5.0 NOTE: New Reference Range  04/01/14)  Routine UA:  12-Apr-16 21:00   Color (UA) Straw  Clarity (UA) Clear  Glucose (UA) Negative  Bilirubin (UA) Negative  Ketones (UA) Negative  Specific Gravity (UA) 1.004  Blood (UA) 1+  pH (UA) 7.0  Protein (UA) Negative  Nitrite (UA) Negative  Leukocyte Esterase (UA) Negative (Result(s) reported on 06 May 2014 at 09:35PM.)  RBC (UA) 0-5  WBC (UA) NONE SEEN  Bacteria (UA) NONE SEEN  Epithelial Cells (UA) NONE SEEN  Result(s) reported on 06 May 2014 at 09:35PM.  Routine Coag:  12-Apr-16 14:10   Prothrombin  17.3 (11.4-15.0 NOTE: New Reference Range  02/21/14)  INR 1.4 (INR reference interval applies to patients on anticoagulant therapy. A single INR therapeutic range for coumarins is not optimal for all indications; however, the suggested range for most indications is 2.0 - 3.0. Exceptions to the INR Reference Range may include: Prosthetic  heart valves, acute myocardial infarction, prevention of myocardial infarction, and combinations of aspirin and anticoagulant. The need for a higher or lower target INR must be assessed individually. Reference: The Pharmacology and Management of the Vitamin K  antagonists: the seventh ACCP Conference on Antithrombotic and Thrombolytic Therapy. TKPTW.6568 Sept:126 (3suppl): [id number]. A HCT value >55% may artifactually increase the PT.  In one study,  the increase was an average of 25%. Reference:  "Effect on Routine and Special Coagulation Testing Values of Citrate Anticoagulant Adjustment in Patients with High HCT Values." American Journal of Clinical Pathology 2006;126:400-405.)  Activated PTT (APTT)  38.5 (A HCT value >55% may artifactually increase the APTT. In one study, the increase was an average of 19%. Reference: "Effect on Routine and Special Coagulation Testing Values of Citrate Anticoagulant Adjustment in Patients with High HCT Values." American Journal of Clinical Pathology 2006;126:400-405.)  Routine Hem:  13-Apr-16 06:11   WBC (CBC) 5.6  RBC (CBC)  3.58  Hematocrit (CBC)  26.2  Platelet Count (CBC) 236  MCV  73  MCH  23.8  MCHC 32.5  RDW  16.7  Neutrophil % 80.0  Lymphocyte % 8.2  Monocyte % 11.7  Eosinophil % 0.0  Basophil % 0.1  Neutrophil # 4.5  Lymphocyte #  0.5  Monocyte # 0.7  Eosinophil # 0.0  Basophil # 0.0 (Result(s) reported on 07 May 2014 at 06:33AM.)  15-Apr-16 04:46   Hemoglobin (CBC)  8.7 (Result(s) reported on 09 May 2014 at 05:40AM.)   Impression/Recommendations: Recommendations:   prior notes reviewed by me reviewed by me   Insomnia-  most likely due to OSA but could have a little anxiety as well;  this by itself can cause confusion after having a lack of sleep for 3 days Encephalopathy-  likely due to 1. but could have some hypercapnea as well start melatonin 3gm PO qHS if above is not effective first night, add Trazadone 19m qHS needs  CPAP and official testing will sign off, please call with questions   Electronic Signatures: SJamison Neighbor(MD)  (Signed 15-Apr-16 08:05)  Authored: REFERRING PHYSICIAN, Primary Care Physician, Consult, History of Present Illness, Review of  Systems, PAST MEDICAL/SURGICAL HISTORY, HOME MEDICATIONS, ALLERGIES, Social/Family History, NURSING VITAL SIGNS, Physical Exam-, LAB RESULTS, Recommendations   Last Updated: 15-Apr-16 08:05 by Jamison Neighbor (MD)

## 2014-05-25 NOTE — H&P (Signed)
PATIENT NAME:  Derek Shelton, Derek Shelton MR#:  [id number] DATE OF BIRTH:  1941-01-14  DATE OF ADMISSION:  05/06/2014  ADMITTING PHYSICIAN:  Dr. Gladstone Lighter, MD.   PRIMARY PHYSICIAN:  Dr. Lamonte Sakai.   PRIMARY CARDIOLOGIST: Dr. Neoma Laming.   CHIEF COMPLAINT:  Altered mental status and myoclonic jerks.   HISTORY OF PRESENT ILLNESS: Derek Shelton is a 74 year old Asian male with past medical history significant for obstructive sleep apnea unable to tolerate CPAP at home, history of atrial fibrillation, osteoarthritis, diabetes, hypertension, was brought in from PCP's office secondary to confusion, difficulty breathing, and worsening pedal edema going on for a few days now. Most of the history is obtained from the patient's younger son and his wife who are at bedside. According to them, who live with the patient, the patient has been having trouble breathing for almost 3-4 weeks now. They feel like he was started on a medication carvedilol and since then they have noticed that he has been congested, wheezing extremely, and was having difficulty breathing. He also had orthopnea that he was unable to lay flat. During the daytime he continued to be sleepy and tired. But over the last couple of days his congestion and wheezing have gotten much worse and the patient has been confused and has had significant myoclonic jerks at home. He went to see his PCP with this presentation, she has sent him to the Emergency Room. In the ER the patient remains confused, but he is alert, he is on a BiPAP now, family feels like his jerks are much improved since he has been on the BiPAP. The patient has not been on any home oxygen. He was also started recently on prednisone taper and albuterol which he is still continuing to take.  His blood pressure is very elevated with systolic greater than 546. The patient does not have a known diagnosis of COPD or asthma. No fevers or chills. According to the family he has been having some dry  cough at home.   PAST MEDICAL HISTORY:  1. Non-insulin-dependent diabetes mellitus.  2. Hypertension.  3. Atrial fibrillation.  4. Obstructive sleep apnea not on CPAP.  5. Osteoarthritis.   PAST SURGICAL HISTORY:  1. Cardiac catheterization.  2. Rectal fistula repair.  3. Right eye growth removal.   ALLERGIES TO MEDICATIONS: PENICILLIN.   CURRENT HOME MEDICATIONS:  1. Amiodarone 200 mg p.o. daily.  2. Avodart 0.5 mg p.o. daily.  3. Benadryl 25 mg p.o. 3 times a day.  4. Benazepril 40 mg p.o. daily.  5. Carvedilol 6.25 mg twice a day.  6. Cymbalta 60 mg p.o. daily.  7. Eliquis 5 mg p.o. b.i.d.  8. Lasix 20 mg p.o. daily.  9. Glimepiride 2 mg p.o. daily.  10. Hydralazine 100 mg p.o. b.i.d.  11. Levothyroxine 50 mcg p.o. daily.  12. Lovaza 2 capsules twice a day.  13. Meloxicam 15 mg p.o. once a day.  14. Metformin 1000 mg p.o. b.i.d.  15. Methylprednisone pack which he is on taper as advised.  16. MiraLax powder p.r.n. for constipation.  17. Nexium 40 mg p.o. daily.   SOCIAL HISTORY: Lives at home with his wife. Ambulates with the help of a cane. Chews tobacco. No smoking. No alcohol use.   FAMILY HISTORY: Significant for diabetes in the family.   REVIEW OF SYSTEMS: Difficult to be obtained secondary to the patient's confusion.   PHYSICAL EXAMINATION:  VITAL SIGNS: Temperature 97.9 degrees Fahrenheit, pulse 62, respirations 24, blood pressure 232/102, pulse  oximetry 95% on BiPAP, 28% FiO2.  GENERAL: Well-built well-nourished male, lying in bed, not in any acute distress.  HEENT: Normocephalic, atraumatic. Pupils equal, round, reacting to light. Anicteric sclerae. Muddy conjunctivae.  Oropharynx is clear without erythema, mass, or exudates.  NECK: Supple. No thyromegaly, JVD or carotid bruits. No lymphadenopathy.  LUNGS: The patient is moving air bilaterally with scattered wheeze, decreased bibasilar breath sounds, no crackles heard.  CARDIOVASCULAR: S1, S2, regular rate  and rhythm, 3/6 systolic murmur heard. No rubs or gallops.  ABDOMEN: Soft, obese, some distention noted, but no tenderness. No hepatosplenomegaly. Hypoactive bowel sounds noted.  EXTREMITIES: He does have 2 + pedal edema all the way up to his knees. Dorsalis pedis pulses palpable bilaterally. No clubbing or cyanosis.  SKIN: No acne, rash or lesions.  LYMPHATICS: No cervical lymphadenopathy.  NEUROLOGIC: The patient is arousable, able to follow simple commands. His strength seems to be appropriate, 5 out of 5 all 4 extremities. No cranial nerve deficits. No facial droop noted. No new motor or sensory deficits. Complete neurologic exam cannot be done because of the patient's disorientation.  PSYCHOLOGIC: He is alert, oriented x 1.   LABORATORY DATA:  1.  WBC 9.9, hemoglobin 8.4, hematocrit 26.4, platelet count 236,000.  2.  Sodium 129, potassium 3.7, chloride 90, bicarbonate 29, BUN 25, creatinine 1.3, glucose 67, and calcium of 9.2.  3.  ALT 31, AST 59, alkaline phosphatase of 57, total bilirubin 0.7, and albumin of 4.0.  4.  BNP is elevated at 493.  5.  Lactic acid within normal limits.  6.  INR is 1.4. 7.  CK 645, CK-MB 19.9, and troponin is 0.03.  8.  Plasma ammonia is only 10.  9.  Chest x-ray showing patchy infiltrate left upper lobe, generalized cardiomegaly, no appreciable pulmonary edema.  10.  EKG showing atrial fibrillation, right bundle branch block, heart rate of 60.   ASSESSMENT AND PLAN: This is a 74 year old male with past medical history significant for hypertension, diabetes mellitus, atrial fibrillation, and arthritis, who presents to the hospital from primary care physician's secondary to altered mental status, myoclonic jerks, and dyspnea, and wheezing.    1.  Altered mental status with myoclonic jerks, could be metabolic encephalopathy from underlying infection. The patient does have pneumonia so follow up blood cultures on IV antibiotics, do neurologic checks q. 4 hours. No  focal deficits noted at this time. Plasma ammonia is within normal limits, so it is not hepatic encephalopathy. Continue to monitor. If anything changes or worsens then we will get a CT head at the time. According to family the patient has been having mild memory deficit and cognitive deficits over the last 1 year. No official diagnosis of dementia yet.  Also get an ABG to rule out CO2 narcosis.  2.  Pneumonia. Left upper lobe pneumonia with reactive airway disease. We will admit to ICU as the patient is on BiPAP.  3.  Acute respiratory failure secondary to pneumonia. Continue BiPAP. Breathing better. Check ABG. Blood cultures were done and IV antibiotics and continue to monitor at this time. Pulmonary consult has been placed. We also do some DuoNebs and Solu-Medrol at this time. 4.  Pedal edema. No prior history of congestive heart failure; not sure if he has any diastolic congestive heart failure, he follows with Dr. Neoma Laming and had a cardiac catheterization in the past. Echo has been ordered, cardiology consultation. Hold off on Lasix because of his elevated BUN and creatinine at this time.  Chest x-ray with no impressive edema and BNP is only slightly elevated. Continue to monitor.  5.  Acute renal failure, could be prerenal versus acute tubular necrosis. Lasix will be on hold. We will hold off on IV fluids with his pedal edema and monitor in the a.m. and adjust medications as needed.  6.  Anemia, acute on chronic anemia. Baseline seems to be around 10, now it is around 9. Continue to monitor. No active bleeding. Iron studies have been ordered.  Will also check stool for occult blood.  7.  Atrial fibrillation, paroxysmal atrial fibrillation, remains in atrial fibrillation, rate well-controlled.  Amiodarone will be restarted. Hold off on Coreg as there is a concern for possible allergic reaction. His rate is controlled. He is on Eliquis which can be continued at this time. 8.  Diabetes mellitus.  Because of poor p.o. intake his sugars have been low in the 60s. So we will hold off on the glimepiride and metformin at this time and just do sliding scale insulin. 9.  Malignant hypertension. Hold off on Lasix and benazepril with his acute renal failure. Hold off on Coreg because of possibility of allergy reaction. Will continue his hydralazine p.o. and will add other medications to help with the blood pressure.   10.  Deep vein thrombosis prophylaxis. The patient on Eliquis already.  11.  Code status is full code.   TOTAL CRITICAL CARE TIME SPENT ON ADMISSION OF THIS PATIENT:  65 minutes.     ____________________________ Gladstone Lighter, MD rk:bu D: 05/06/2014 18:58:18 ET T: 05/06/2014 19:44:46 ET JOB#: [id number]  cc: Gladstone Lighter, MD, <Dictator> Perrin Maltese, MD Dionisio David, MD Gladstone Lighter MD ELECTRONICALLY SIGNED 05/16/2014 15:16

## 2014-05-29 DIAGNOSIS — R413 Other amnesia: Secondary | ICD-10-CM | POA: Insufficient documentation

## 2014-06-26 ENCOUNTER — Ambulatory Visit: Payer: Medicare Other | Attending: Neurology | Admitting: Speech Pathology

## 2014-06-26 ENCOUNTER — Encounter: Payer: Self-pay | Admitting: Speech Pathology

## 2014-06-26 DIAGNOSIS — F0390 Unspecified dementia without behavioral disturbance: Secondary | ICD-10-CM | POA: Diagnosis not present

## 2014-06-26 DIAGNOSIS — R41841 Cognitive communication deficit: Secondary | ICD-10-CM | POA: Diagnosis not present

## 2014-06-27 NOTE — Therapy (Signed)
[place] [facility]IN [facility] SERVICES [address] [place], [place], [address] Phone: [phone]   Fax:  [phone]  Speech Language Pathology Evaluation  Patient Details  Name: Derek Shelton MRN: [id number] Date of Birth: [birth year] Referring Provider:  Anabel Bene, MD  Encounter Date: 06/26/2014      End of Session - 06/26/14 1634    Visit Number 1   Number of Visits 1   Date for SLP Re-Evaluation 06/26/14   SLP Start Time 2683   SLP Stop Time  1405   SLP Time Calculation (min) 48 min   Activity Tolerance Patient tolerated treatment well      Past Medical History  Diagnosis Date  . Hypertension   . Sleep apnea   . Dementia     Per son's report    History reviewed. No pertinent past surgical history.  There were no vitals filed for this visit.  Visit Diagnosis: Cognitive communication deficit - Plan: SLP plan of care cert/re-cert      Subjective Assessment - 06/26/14 1015    Subjective The patient is a native Urdu speaker and does not understand/speak English.  The patient's son serves as his interpreter and conveys his history.   Patient is accompained by: Family member   Currently in Pain? No/denies            SLP Evaluation [facility] - 06/26/14 1400    SLP Visit Information   SLP Received On 06/26/14   Medical Diagnosis Dementia   Prior Functional Status   Cognitive/Linguistic Baseline Baseline deficits   Baseline deficit details Memory loss, dementia   Cognition   Overall Cognitive Status History of cognitive impairments - at baseline   Standardized Assessments   Standardized Assessments  Other Assessment  Family interview      Family interview: The patient's son reports that the patient does not initiate activities of daily living or recreational activities.  He is frequently non-compliant for taking his medication and using his CPAP at night.  Per son, the primary family concern is how to maximize the  patient's compliance for medical interventions to maintain his physical health and to maximize the patient's engagement in functional and recreational activities.        SLP Education - 06/26/14 1632    Education provided Yes   Education Details Gave information to contact [employer], discussed use of written schedules, may want to request home health speech therapy, gave "Tips for Living with Dementia"   Person(s) Educated Patient;Child(ren)   Methods Explanation;Handout   Comprehension Verbalized understanding;Other (comment)  Son understands, patient does not              Plan - 06/26/14 1017    Clinical Impression Statement 74 year old native Urdu speaker is presenting with moderate-severe cognitive communication deficits secondary dementia.  He is accompanied by his son, who relays his current functional status.  The patient is not independent for his daily routine, requiring his caregiver (wife) to organize his meals, meds, provide reminder to use his CPAP at night, provide reminders for activities of daily living, etc.  The patient is frequently non-compliant, per his son.  Per son, the primary family concern is how to maximize the patient's compliance for medical interventions to maintain his physical health and to maximize the patient's engagement in functional and recreational activities.  SLP provided written "Tips for Living with Dementia" and we discussed some specific strategies that may improve the patient's compliance and engagement  in activities of daily living.  The son was given contact information for [employer] as a source for community resources.  In view of the progressive nature of dementia, the patient is not a candidate for cognitive rehab.  If the family needs help in structuring the patient's environment to maximize engagement and compliance, home health speech therapy would be a more effective venue than an outpatient clinic setting.   Speech  Therapy Frequency 1x /week   Duration 1 week   Treatment/Interventions Patient/family education   Potential to Achieve Goals Fair   Potential Considerations Ability to learn/carryover information;Co-morbidities;Severity of impairments;Family/community support   SLP Home Exercise Plan "Tips for Living with Dementia", specific suggestions to try, contact information for [facility]   Consulted and Agree with Plan of Care Patient;Family member/caregiver   Family Member Consulted Son          G-Codes - 07/21/14 1021    Functional Assessment Tool Used Interview, clinical judgment   Functional Limitations Memory   Memory Current Status [phone]) At least 60 percent but less than 80 percent impaired, limited or restricted   Memory Goal Status (X8338) At least 60 percent but less than 80 percent impaired, limited or restricted   Memory Discharge Status (G9170) At least 60 percent but less than 80 percent impaired, limited or restricted      Problem List There are no active problems to display for this patient.  Leroy Sea, MS/CCC- SLP  Lou Miner 07-21-2014, 10:26 AM  [place] [facility] SERVICES [address] [place], [place], [address] Phone: [phone]   Fax:  [phone]

## 2014-08-06 ENCOUNTER — Encounter: Payer: Self-pay | Admitting: Emergency Medicine

## 2014-08-06 ENCOUNTER — Emergency Department: Payer: Medicare Other

## 2014-08-06 ENCOUNTER — Inpatient Hospital Stay
Admission: EM | Admit: 2014-08-06 | Discharge: 2014-08-12 | DRG: 378 | Disposition: A | Discharge status: Home / Self Care | Payer: Medicare Other | Attending: Internal Medicine | Admitting: Internal Medicine

## 2014-08-06 DIAGNOSIS — Z833 Family history of diabetes mellitus: Secondary | ICD-10-CM

## 2014-08-06 DIAGNOSIS — E669 Obesity, unspecified: Secondary | ICD-10-CM | POA: Diagnosis present

## 2014-08-06 DIAGNOSIS — K219 Gastro-esophageal reflux disease without esophagitis: Secondary | ICD-10-CM | POA: Diagnosis present

## 2014-08-06 DIAGNOSIS — R001 Bradycardia, unspecified: Secondary | ICD-10-CM | POA: Diagnosis present

## 2014-08-06 DIAGNOSIS — J449 Chronic obstructive pulmonary disease, unspecified: Secondary | ICD-10-CM | POA: Diagnosis present

## 2014-08-06 DIAGNOSIS — I4891 Unspecified atrial fibrillation: Secondary | ICD-10-CM | POA: Diagnosis present

## 2014-08-06 DIAGNOSIS — F0390 Unspecified dementia without behavioral disturbance: Secondary | ICD-10-CM | POA: Diagnosis present

## 2014-08-06 DIAGNOSIS — Z79899 Other long term (current) drug therapy: Secondary | ICD-10-CM | POA: Diagnosis not present

## 2014-08-06 DIAGNOSIS — K921 Melena: Principal | ICD-10-CM | POA: Diagnosis present

## 2014-08-06 DIAGNOSIS — E119 Type 2 diabetes mellitus without complications: Secondary | ICD-10-CM | POA: Diagnosis present

## 2014-08-06 DIAGNOSIS — D649 Anemia, unspecified: Secondary | ICD-10-CM | POA: Diagnosis present

## 2014-08-06 DIAGNOSIS — K2970 Gastritis, unspecified, without bleeding: Secondary | ICD-10-CM | POA: Diagnosis present

## 2014-08-06 DIAGNOSIS — D500 Iron deficiency anemia secondary to blood loss (chronic): Secondary | ICD-10-CM | POA: Diagnosis present

## 2014-08-06 DIAGNOSIS — I129 Hypertensive chronic kidney disease with stage 1 through stage 4 chronic kidney disease, or unspecified chronic kidney disease: Secondary | ICD-10-CM | POA: Diagnosis present

## 2014-08-06 DIAGNOSIS — D62 Acute posthemorrhagic anemia: Secondary | ICD-10-CM | POA: Diagnosis present

## 2014-08-06 DIAGNOSIS — K640 First degree hemorrhoids: Secondary | ICD-10-CM | POA: Diagnosis present

## 2014-08-06 DIAGNOSIS — W1830XA Fall on same level, unspecified, initial encounter: Secondary | ICD-10-CM | POA: Diagnosis present

## 2014-08-06 DIAGNOSIS — N183 Chronic kidney disease, stage 3 (moderate): Secondary | ICD-10-CM | POA: Diagnosis present

## 2014-08-06 DIAGNOSIS — I5032 Chronic diastolic (congestive) heart failure: Secondary | ICD-10-CM | POA: Diagnosis present

## 2014-08-06 DIAGNOSIS — Y92019 Unspecified place in single-family (private) house as the place of occurrence of the external cause: Secondary | ICD-10-CM | POA: Diagnosis not present

## 2014-08-06 DIAGNOSIS — Z7902 Long term (current) use of antithrombotics/antiplatelets: Secondary | ICD-10-CM

## 2014-08-06 DIAGNOSIS — Z9889 Other specified postprocedural states: Secondary | ICD-10-CM | POA: Diagnosis not present

## 2014-08-06 DIAGNOSIS — E039 Hypothyroidism, unspecified: Secondary | ICD-10-CM | POA: Diagnosis present

## 2014-08-06 DIAGNOSIS — G4733 Obstructive sleep apnea (adult) (pediatric): Secondary | ICD-10-CM | POA: Diagnosis present

## 2014-08-06 DIAGNOSIS — I480 Paroxysmal atrial fibrillation: Secondary | ICD-10-CM | POA: Diagnosis present

## 2014-08-06 DIAGNOSIS — Z6833 Body mass index (BMI) 33.0-33.9, adult: Secondary | ICD-10-CM | POA: Diagnosis not present

## 2014-08-06 DIAGNOSIS — Z9119 Patient's noncompliance with other medical treatment and regimen: Secondary | ICD-10-CM | POA: Diagnosis present

## 2014-08-06 DIAGNOSIS — I440 Atrioventricular block, first degree: Secondary | ICD-10-CM | POA: Diagnosis present

## 2014-08-06 DIAGNOSIS — F17220 Nicotine dependence, chewing tobacco, uncomplicated: Secondary | ICD-10-CM | POA: Diagnosis present

## 2014-08-06 DIAGNOSIS — K922 Gastrointestinal hemorrhage, unspecified: Secondary | ICD-10-CM | POA: Diagnosis present

## 2014-08-06 DIAGNOSIS — E871 Hypo-osmolality and hyponatremia: Secondary | ICD-10-CM | POA: Diagnosis present

## 2014-08-06 HISTORY — DX: Type 2 diabetes mellitus without complications: E11.9

## 2014-08-06 HISTORY — DX: Gastro-esophageal reflux disease without esophagitis: K21.9

## 2014-08-06 HISTORY — DX: Chronic obstructive pulmonary disease, unspecified: J44.9

## 2014-08-06 HISTORY — DX: Heart failure, unspecified: I50.9

## 2014-08-06 HISTORY — DX: Disorder of thyroid, unspecified: E07.9

## 2014-08-06 HISTORY — DX: Anemia, unspecified: D64.9

## 2014-08-06 HISTORY — DX: Reserved for inherently not codable concepts without codable children: IMO0001

## 2014-08-06 LAB — COMPREHENSIVE METABOLIC PANEL
ALT: 16 U/L — ABNORMAL LOW (ref 17–63)
ANION GAP: 6 (ref 5–15)
AST: 24 U/L (ref 15–41)
Albumin: 3.8 g/dL (ref 3.5–5.0)
Alkaline Phosphatase: 60 U/L (ref 38–126)
BILIRUBIN TOTAL: 0.8 mg/dL (ref 0.3–1.2)
BUN: 22 mg/dL — ABNORMAL HIGH (ref 6–20)
CALCIUM: 8.2 mg/dL — AB (ref 8.9–10.3)
CO2: 27 mmol/L (ref 22–32)
Chloride: 96 mmol/L — ABNORMAL LOW (ref 101–111)
Creatinine, Ser: 1.56 mg/dL — ABNORMAL HIGH (ref 0.61–1.24)
GFR calc Af Amer: 48 mL/min — ABNORMAL LOW (ref >60)
GFR calc non Af Amer: 42 mL/min — ABNORMAL LOW (ref >60)
Glucose, Bld: 167 mg/dL — ABNORMAL HIGH (ref 65–99)
POTASSIUM: 4.1 mmol/L (ref 3.5–5.1)
Sodium: 129 mmol/L — ABNORMAL LOW (ref 135–145)
Total Protein: 7.3 g/dL (ref 6.5–8.1)

## 2014-08-06 LAB — RETICULOCYTES
RBC.: 2.96 MIL/uL — AB (ref 4.40–5.90)
RETIC COUNT ABSOLUTE: 62.2 10*3/uL (ref 19.0–183.0)
RETIC CT PCT: 2.1 % (ref 0.4–3.1)

## 2014-08-06 LAB — CBC
HCT: 20.4 % — ABNORMAL LOW (ref 40.0–52.0)
HCT: 20.3 % — ABNORMAL LOW (ref 40.0–52.0)
HEMOGLOBIN: 6.4 g/dL — AB (ref 13.0–18.0)
Hemoglobin: 6.4 g/dL — ABNORMAL LOW (ref 13.0–18.0)
MCH: 21.8 pg — AB (ref 26.0–34.0)
MCH: 21.5 pg — AB (ref 26.0–34.0)
MCHC: 31.5 g/dL — ABNORMAL LOW (ref 32.0–36.0)
MCHC: 31.3 g/dL — ABNORMAL LOW (ref 32.0–36.0)
MCV: 69.3 fL — ABNORMAL LOW (ref 80.0–100.0)
MCV: 68.9 fL — ABNORMAL LOW (ref 80.0–100.0)
Platelets: 225 10*3/uL (ref 150–440)
Platelets: 214 10*3/uL (ref 150–440)
RBC: 2.95 MIL/uL — ABNORMAL LOW (ref 4.40–5.90)
RBC: 2.95 MIL/uL — ABNORMAL LOW (ref 4.40–5.90)
RDW: 18.4 % — ABNORMAL HIGH (ref 11.5–14.5)
RDW: 18.2 % — AB (ref 11.5–14.5)
WBC: 5.6 10*3/uL (ref 3.8–10.6)
WBC: 5.9 10*3/uL (ref 3.8–10.6)

## 2014-08-06 LAB — FERRITIN: FERRITIN: 5 ng/mL — AB (ref 24–336)

## 2014-08-06 LAB — TSH: TSH: 8.343 u[IU]/mL — ABNORMAL HIGH (ref 0.350–4.500)

## 2014-08-06 LAB — ABO/RH: ABO/RH(D): A POS

## 2014-08-06 LAB — TROPONIN I: TROPONIN I: 0.03 ng/mL (ref <0.031)

## 2014-08-06 LAB — IRON AND TIBC
Iron: 11 ug/dL — ABNORMAL LOW (ref 45–182)
Saturation Ratios: 2 % — ABNORMAL LOW (ref 17.9–39.5)
TIBC: 574 ug/dL — AB (ref 250–450)
UIBC: 563 ug/dL

## 2014-08-06 LAB — GLUCOSE, CAPILLARY
Glucose-Capillary: 157 mg/dL — ABNORMAL HIGH (ref 65–99)
Glucose-Capillary: 85 mg/dL (ref 65–99)

## 2014-08-06 LAB — FOLATE: FOLATE: 14.2 ng/mL (ref >5.9)

## 2014-08-06 LAB — PREPARE RBC (CROSSMATCH)

## 2014-08-06 MED ORDER — SODIUM CHLORIDE 0.9 % IV SOLN
10.0000 mL/h | Freq: Once | INTRAVENOUS | Status: AC
  Administered 2014-08-06: 10 mL/h via INTRAVENOUS

## 2014-08-06 MED ORDER — HYDRALAZINE HCL 50 MG PO TABS
100.0000 mg | ORAL_TABLET | Freq: Two times a day (BID) | ORAL | Status: DC
  Administered 2014-08-06 – 2014-08-08 (×4): 100 mg via ORAL
  Filled 2014-08-06 (×4): qty 2

## 2014-08-06 MED ORDER — INSULIN ASPART 100 UNIT/ML ~~LOC~~ SOLN
0.0000 [IU] | Freq: Three times a day (TID) | SUBCUTANEOUS | Status: DC
  Administered 2014-08-07: 3 [IU] via SUBCUTANEOUS
  Administered 2014-08-08: 2 [IU] via SUBCUTANEOUS
  Administered 2014-08-09: 3 [IU] via SUBCUTANEOUS
  Administered 2014-08-09: 2 [IU] via SUBCUTANEOUS
  Filled 2014-08-06: qty 2
  Filled 2014-08-06 (×2): qty 3
  Filled 2014-08-06: qty 2

## 2014-08-06 MED ORDER — FUROSEMIDE 10 MG/ML IJ SOLN
40.0000 mg | Freq: Once | INTRAMUSCULAR | Status: AC
  Administered 2014-08-06: 40 mg via INTRAVENOUS
  Filled 2014-08-06: qty 4

## 2014-08-06 MED ORDER — FINASTERIDE 5 MG PO TABS
5.0000 mg | ORAL_TABLET | Freq: Every day | ORAL | Status: DC
Start: 2014-08-06 — End: 2014-08-12
  Administered 2014-08-06 – 2014-08-12 (×7): 5 mg via ORAL
  Filled 2014-08-06 (×7): qty 1

## 2014-08-06 MED ORDER — ACETAMINOPHEN 325 MG PO TABS: 650.0000 mg | ORAL_TABLET | Freq: Four times a day (QID) | ORAL | Status: DC | PRN

## 2014-08-06 MED ORDER — ONDANSETRON HCL 4 MG/2ML IJ SOLN: 4.0000 mg | Freq: Four times a day (QID) | INTRAMUSCULAR | Status: DC | PRN

## 2014-08-06 MED ORDER — SODIUM CHLORIDE 0.9 % IJ SOLN
3.0000 mL | Freq: Two times a day (BID) | INTRAMUSCULAR | Status: DC
  Administered 2014-08-06 – 2014-08-12 (×9): 3 mL via INTRAVENOUS

## 2014-08-06 MED ORDER — HYDRALAZINE HCL 20 MG/ML IJ SOLN
10.0000 mg | Freq: Four times a day (QID) | INTRAMUSCULAR | Status: DC | PRN
  Administered 2014-08-06 – 2014-08-12 (×4): 10 mg via INTRAVENOUS
  Filled 2014-08-06 (×4): qty 1

## 2014-08-06 MED ORDER — FUROSEMIDE 20 MG PO TABS
20.0000 mg | ORAL_TABLET | Freq: Every day | ORAL | Status: DC
  Administered 2014-08-06 – 2014-08-09 (×4): 20 mg via ORAL
  Filled 2014-08-06 (×4): qty 1

## 2014-08-06 MED ORDER — ALBUTEROL SULFATE (2.5 MG/3ML) 0.083% IN NEBU
3.0000 mL | INHALATION_SOLUTION | RESPIRATORY_TRACT | Status: DC | PRN
  Administered 2014-08-07 – 2014-08-08 (×2): 3 mL via RESPIRATORY_TRACT
  Filled 2014-08-06 (×2): qty 3

## 2014-08-06 MED ORDER — INSULIN ASPART 100 UNIT/ML ~~LOC~~ SOLN: 0.0000 [IU] | Freq: Every day | SUBCUTANEOUS | Status: DC

## 2014-08-06 MED ORDER — AMIODARONE HCL 200 MG PO TABS
200.0000 mg | ORAL_TABLET | Freq: Every day | ORAL | Status: DC
  Administered 2014-08-06 – 2014-08-12 (×6): 200 mg via ORAL
  Filled 2014-08-06 (×6): qty 1

## 2014-08-06 MED ORDER — PANTOPRAZOLE SODIUM 40 MG IV SOLR
40.0000 mg | Freq: Two times a day (BID) | INTRAVENOUS | Status: DC
  Administered 2014-08-06 – 2014-08-12 (×12): 40 mg via INTRAVENOUS
  Filled 2014-08-06 (×12): qty 40

## 2014-08-06 MED ORDER — ACETAMINOPHEN 650 MG RE SUPP: 650.0000 mg | Freq: Four times a day (QID) | RECTAL | Status: DC | PRN

## 2014-08-06 MED ORDER — BENAZEPRIL HCL 20 MG PO TABS
40.0000 mg | ORAL_TABLET | Freq: Every day | ORAL | Status: DC
  Administered 2014-08-07 – 2014-08-12 (×5): 40 mg via ORAL
  Filled 2014-08-06: qty 2
  Filled 2014-08-06 (×3): qty 1
  Filled 2014-08-06: qty 2
  Filled 2014-08-06: qty 1
  Filled 2014-08-06 (×2): qty 2

## 2014-08-06 MED ORDER — ONDANSETRON HCL 4 MG PO TABS: 4.0000 mg | ORAL_TABLET | Freq: Four times a day (QID) | ORAL | Status: DC | PRN

## 2014-08-06 MED ORDER — LEVOTHYROXINE SODIUM 50 MCG PO TABS: 50.0000 ug | ORAL_TABLET | Freq: Every day | ORAL | Status: DC

## 2014-08-06 MED ORDER — TIOTROPIUM BROMIDE MONOHYDRATE 18 MCG IN CAPS
18.0000 ug | ORAL_CAPSULE | Freq: Every day | RESPIRATORY_TRACT | Status: DC
  Administered 2014-08-06 – 2014-08-12 (×6): 18 ug via RESPIRATORY_TRACT
  Filled 2014-08-06 (×2): qty 5

## 2014-08-06 NOTE — ED Notes (Signed)
Per family he had a syncopal episode yesterday   But is now having some SOB. Worse over the day  Positive headache

## 2014-08-06 NOTE — ED Notes (Signed)
Called floor to give report pending RN call back

## 2014-08-06 NOTE — ED Provider Notes (Signed)
[facility] Emergency Department Provider Note  ____________________________________________  Time seen: Approximately 4 PM  I have reviewed the triage vital signs and the nursing notes.   HISTORY  Chief Complaint Shortness of Breath    HPI Derek Shelton is a 74 y.o. male with a history of atrial fibrillation on eliquis who presents today with shortness of breath over the past month. The patient denies any chest pain. Says that the shortness of breath is worse with exertion. Denies any rectal bleeding. No fevers. Patient also reportedly fell yesterday and possibly hit head was complaining of headache previously but none at this time.Unknown circumstances of the fall.   Past Medical History  Diagnosis Date  . Hypertension   . Sleep apnea   . Dementia     Per son's report  . Diabetes mellitus without complication   . Thyroid disease   . GERD (gastroesophageal reflux disease)   . Atrial fibrillation   . COPD (chronic obstructive pulmonary disease)   . CHF (congestive heart failure)     There are no active problems to display for this patient.   History reviewed. No pertinent past surgical history.  Current Outpatient Rx  Name  Route  Sig  Dispense  Refill  . Albuterol Sulfate 108 (90 BASE) MCG/ACT AEPB   Inhalation   Inhale into the lungs.         Marland Kitchen amiodarone (PACERONE) 200 MG tablet   Oral   Take 200 mg by mouth daily.         Marland Kitchen apixaban (ELIQUIS) KIT   Does not apply   by Does not apply route once.         . benazepril (LOTENSIN) 40 MG tablet   Oral   Take 40 mg by mouth daily.         . carvedilol (COREG) 6.25 MG tablet   Oral   Take 6.25 mg by mouth 2 (two) times daily with a meal.         . DULoxetine (CYMBALTA) 60 MG capsule   Oral   Take 60 mg by mouth daily.         Marland Kitchen esomeprazole (NEXIUM) 40 MG capsule   Oral   Take 40 mg by mouth daily at 12 noon.         . finasteride (PROSCAR) 5 MG tablet   Oral  Take 5 mg by mouth daily.         . furosemide (LASIX) 20 MG tablet   Oral   Take 20 mg by mouth.         Marland Kitchen glimepiride (AMARYL) 2 MG tablet   Oral   Take 2 mg by mouth daily with breakfast.         . hydrALAZINE (APRESOLINE) 100 MG tablet   Oral   Take 100 mg by mouth 2 (two) times daily.         Marland Kitchen levothyroxine (SYNTHROID, LEVOTHROID) 50 MCG tablet   Oral   Take 50 mcg by mouth daily before breakfast.         . meloxicam (MOBIC) 15 MG tablet   Oral   Take 15 mg by mouth daily.         . metFORMIN (GLUCOPHAGE) 1000 MG tablet   Oral   Take 1,000 mg by mouth 2 (two) times daily with a meal.         . omega-3 acid ethyl esters (LOVAZA) 1 G capsule   Oral  Take by mouth 2 (two) times daily.         Marland Kitchen tiotropium (SPIRIVA) 18 MCG inhalation capsule   Inhalation   Place 18 mcg into inhaler and inhale daily.         . traZODone (DESYREL) 100 MG tablet   Oral   Take 100 mg by mouth at bedtime.           Allergies Review of patient's allergies indicates no known allergies.  No family history on file.  Social History History  Substance Use Topics  . Smoking status: Never Smoker   . Smokeless tobacco: Current User    Types: Chew  . Alcohol Use: No    Review of Systems Constitutional: No fever/chills Eyes: No visual changes. ENT: No sore throat. Cardiovascular: Denies chest pain. Respiratory: As above  Gastrointestinal: No abdominal pain.  No nausea, no vomiting.  No diarrhea.  No constipation. Genitourinary: Negative for dysuria. Musculoskeletal: Negative for back pain. Skin: Negative for rash. Neurological: Negative for headaches, focal weakness or numbness.  10-point ROS otherwise negative.  ____________________________________________   PHYSICAL EXAM:  VITAL SIGNS: ED Triage Vitals  Enc Vitals Group     BP 08/06/14 1416 139/56 mmHg     Pulse Rate 08/06/14 1414 55     Resp 08/06/14 1414 20     Temp 08/06/14 1414 98.5 F (36.9  C)     Temp Source 08/06/14 1414 Oral     SpO2 08/06/14 1414 100 %     Weight 08/06/14 1414 220 lb (99.791 kg)     Height 08/06/14 1414 5\' 6"  (1.676 m)     Head Cir --      Peak Flow --      Pain Score 08/06/14 1419 5     Pain Loc --      Pain Edu? --      Excl. in GC? --     Constitutional: Alert and oriented. Well appearing and in no acute distress. Eyes: Conjunctivae are pale. PERRL. EOMI. Head: Atraumatic. Nose: No congestion/rhinnorhea. Mouth/Throat: Mucous membranes are moist.  Oropharynx non-erythematous. Neck: No stridor.   Cardiovascular: Bradycardic, regular rhythm. Grossly normal heart sounds.  Good peripheral circulation. Respiratory: Normal respiratory effort.  No retractions. Lungs CTAB. Gastrointestinal: Soft and nontender. No distention. No abdominal bruits. No CVA tenderness. Rectal exam with brown stool which is strongly heme-positive. No engorged hemorrhoids on external exam. Musculoskeletal: No lower extremity tenderness nor edema.  No joint effusions. Neurologic:  Normal speech and language. No gross focal neurologic deficits are appreciated. No gait instability. Skin:  Skin is warm, dry and intact. No rash noted. Psychiatric: Mood and affect are normal. Speech and behavior are normal.  ____________________________________________   LABS (all labs ordered are listed, but only abnormal results are displayed)  Labs Reviewed  GLUCOSE, CAPILLARY - Abnormal; Notable for the following:    Glucose-Capillary 157 (*)    All other components within normal limits  CBC - Abnormal; Notable for the following:    RBC 2.95 (*)    Hemoglobin 6.4 (*)    HCT 20.4 (*)    MCV 69.3 (*)    MCH 21.8 (*)    MCHC 31.5 (*)    RDW 18.4 (*)    All other components within normal limits  COMPREHENSIVE METABOLIC PANEL - Abnormal; Notable for the following:    Sodium 129 (*)    Chloride 96 (*)    Glucose, Bld 167 (*)    BUN 22 (*)  Creatinine, Ser 1.56 (*)    Calcium 8.2 (*)     ALT 16 (*)    GFR calc non Af Amer 42 (*)    GFR calc Af Amer 48 (*)    All other components within normal limits  TROPONIN I   ____________________________________________  EKG  ED ECG REPORT I, Doran Stabler, the attending physician, personally viewed and interpreted this ECG.   Date: 08/06/2014  EKG Time: 1430  Rate: 52  Rhythm: Bradycardia. Likely this rhythm. Rhythm is regular although P waves not clearly visualized.  Axis: Normal axis  Intervals:right bundle branch block  ST&T Change: No ST elevations or depressions. No abnormal T-wave inversions.  ____________________________________________  RADIOLOGY  No acute disease on chest x-ray. I personally reviewed these films. No acute findings on the CAT scan of his brain. ____________________________________________   PROCEDURES    ____________________________________________   INITIAL IMPRESSION / ASSESSMENT AND PLAN / ED COURSE  Pertinent labs & imaging results that were available during my care of the patient were reviewed by me and considered in my medical decision making (see chart for details).  ----------------------------------------- 4:18 PM on 08/06/2014 -----------------------------------------  Patient found to be anemic to 6.4. Presentation likely representative of symptomatic anemia. We'll transfuse and admit to the hospital. Possibly anemic from rectal bleeding. Signed out to Dr. Volanda Napoleon.  ____________________________________________   FINAL CLINICAL IMPRESSION(S) / ED DIAGNOSES  Acute symptomatic anemia. Initial visit.    Orbie Pyo, MD 08/06/14 [phone]

## 2014-08-06 NOTE — H&P (Signed)
[facility] [place] NAME: Derek Shelton    MR#:  [id number]  DATE OF BIRTH:  [birth year]  DATE OF ADMISSION:  08/06/2014  PRIMARY CARE PHYSICIAN: No primary care provider on file.   REQUESTING/REFERRING PHYSICIAN: Dr. Clearnce Hasten  CHIEF COMPLAINT:   Chief Complaint  Patient presents with  . Shortness of Breath    HISTORY OF PRESENT ILLNESS:  Derek Shelton  is a 74 y.o. male with a known history of obstructive sleep apnea and not compliant with CPAP, chronic diastolic congestive heart failure, obesity, paroxysmal atrial fibrillation on Eliquis, diabetes and hypertension presents with several days of progressive shortness of breath and fatigue. Yesterday he fell while walking to his bedroom and his wife found him on the floor, she was able to get him up to the bed and he rested and felt slightly better. This morning he continued to be very short of breath and dizzy and was brought to the emergency room by his family. On presentation he is found to be bradycardic in the 40s and 50s and also anemic with a hemoglobin of 6.4. He denies nausea vomiting abdominal pain melanoma or hematochezia. Denies palpitations, diaphoresis or syncope or chest pain. The history is provided by his son as the has dementia and is a poor historian.   PAST MEDICAL HISTORY:   Past Medical History  Diagnosis Date  . Hypertension   . Sleep apnea   . Dementia     Per son's report  . Diabetes mellitus without complication   . Thyroid disease   . GERD (gastroesophageal reflux disease)   . Atrial fibrillation   . COPD (chronic obstructive pulmonary disease)   . CHF (congestive heart failure)   . Shortness of breath dyspnea   . Anemia     PAST SURGICAL HISTORY:   Past Surgical History  Procedure Laterality Date  . Cardiac catheterization    . Eye surgery    . Rectal fistula N/A     SOCIAL HISTORY:   History  Substance Use Topics  . Smoking status:  Never Smoker   . Smokeless tobacco: Current User    Types: Chew  . Alcohol Use: No    FAMILY HISTORY:   Family History  Problem Relation Age of Onset  . Diabetes Mother     DRUG ALLERGIES:  No Known Allergies  REVIEW OF SYSTEMS:   Review of Systems  Constitutional: Negative for fever, chills, weight loss and malaise/fatigue.  HENT: Negative for congestion and hearing loss.   Eyes: Negative for blurred vision and pain.  Respiratory: Positive for shortness of breath. Negative for cough, hemoptysis, sputum production and stridor.   Cardiovascular: Positive for leg swelling. Negative for chest pain, palpitations and orthopnea.  Gastrointestinal: Negative for nausea, vomiting, abdominal pain, diarrhea, constipation and blood in stool.  Genitourinary: Negative for dysuria and frequency.  Musculoskeletal: Negative for myalgias, back pain, joint pain and neck pain.  Skin: Negative for rash.  Neurological: Positive for dizziness and weakness. Negative for focal weakness, loss of consciousness and headaches.  Endo/Heme/Allergies: Does not bruise/bleed easily.  Psychiatric/Behavioral: Negative for depression and hallucinations. The patient is not nervous/anxious.     MEDICATIONS AT HOME:   Prior to Admission medications   Medication Sig Start Date End Date Taking? Authorizing Provider  Albuterol Sulfate 108 (90 BASE) MCG/ACT AEPB Inhale into the lungs.    Historical Provider, MD  amiodarone (PACERONE) 200 MG tablet Take 200 mg by mouth daily.  Historical Provider, MD  apixaban (ELIQUIS) KIT by Does not apply route once.    Historical Provider, MD  benazepril (LOTENSIN) 40 MG tablet Take 40 mg by mouth daily.    Historical Provider, MD  carvedilol (COREG) 6.25 MG tablet Take 6.25 mg by mouth 2 (two) times daily with a meal.    Historical Provider, MD  DULoxetine (CYMBALTA) 60 MG capsule Take 60 mg by mouth daily.    Historical Provider, MD  esomeprazole (NEXIUM) 40 MG capsule Take 40  mg by mouth daily at 12 noon.    Historical Provider, MD  finasteride (PROSCAR) 5 MG tablet Take 5 mg by mouth daily.    Historical Provider, MD  furosemide (LASIX) 20 MG tablet Take 20 mg by mouth.    Historical Provider, MD  glimepiride (AMARYL) 2 MG tablet Take 2 mg by mouth daily with breakfast.    Historical Provider, MD  hydrALAZINE (APRESOLINE) 100 MG tablet Take 100 mg by mouth 2 (two) times daily.    Historical Provider, MD  levothyroxine (SYNTHROID, LEVOTHROID) 50 MCG tablet Take 50 mcg by mouth daily before breakfast.    Historical Provider, MD  meloxicam (MOBIC) 15 MG tablet Take 15 mg by mouth daily.    Historical Provider, MD  metFORMIN (GLUCOPHAGE) 1000 MG tablet Take 1,000 mg by mouth 2 (two) times daily with a meal.    Historical Provider, MD  omega-3 acid ethyl esters (LOVAZA) 1 G capsule Take by mouth 2 (two) times daily.    Historical Provider, MD  tiotropium (SPIRIVA) 18 MCG inhalation capsule Place 18 mcg into inhaler and inhale daily.    Historical Provider, MD  traZODone (DESYREL) 100 MG tablet Take 100 mg by mouth at bedtime.    Historical Provider, MD      VITAL SIGNS:  Blood pressure 160/85, pulse 48, temperature 98.5 F (36.9 C), temperature source Oral, resp. rate 20, height _0  (1.676 m), weight 99.791 kg (220 lb), SpO2 99 %.  PHYSICAL EXAMINATION:  GENERAL:  74 y.o.-year-old patient lying in the bed, ill appearing, short of breath, obese EYES: Pupils equal, round, reactive to light and accommodation. No scleral icterus. Extraocular muscles intact.  HEENT: Head atraumatic, normocephalic. Oropharynx and nasopharynx clear. Mucous membranes moist NECK:  Supple, no jugular venous distention. No thyroid enlargement, no tenderness.  LUNGS: Normal breath sounds bilaterally, no wheezing, rales, rhonchi or crepitation. No use of accessory muscles of respiration. Short shallow respirations CARDIOVASCULAR: S1, S2 normal. No murmurs, rubs, or gallops. Regular ABDOMEN:  Soft, nontender, nondistended. Bowel sounds decreased. No organomegaly or mass. No guarding no rebound  EXTREMITIES: +1 pedal edema, no cyanosis, or clubbing. Peripheral pulses 2+ NEUROLOGIC: Cranial nerves II through XII are intact. Muscle strength 5/5 in all extremities. Sensation intact. Gait not checked.  PSYCHIATRIC: The patient is alert otherwise difficult to assess orientation due to language barrier and distress  SKIN: No obvious rash, lesion, or ulcer.   LABORATORY PANEL:   CBC  Recent Labs Lab 08/06/14 1436  WBC 5.6  HGB 6.4*  HCT 20.4*  PLT 225   ------------------------------------------------------------------------------------------------------------------  Chemistries   Recent Labs Lab 08/06/14 1436  NA 129*  K 4.1  CL 96*  CO2 27  GLUCOSE 167*  BUN 22*  CREATININE 1.56*  CALCIUM 8.2*  AST 24  ALT 16*  ALKPHOS 60  BILITOT 0.8   ------------------------------------------------------------------------------------------------------------------  Cardiac Enzymes  Recent Labs Lab 08/06/14 1436  TROPONINI 0.03   ------------------------------------------------------------------------------------------------------------------  RADIOLOGY:  Dg Chest 2 View  08/06/2014   CLINICAL DATA:  Shortness of breath for 2 days  EXAM: CHEST - 2 VIEW  COMPARISON:  05/06/2014  FINDINGS: Cardiac shadow remains enlarged. The patchy infiltrates seen on the prior exam has resolved in the interval. No focal infiltrate or effusion is seen. No significant vascular congestion is noted. No acute bony abnormality seen.  IMPRESSION: Stable cardiomegaly.  No acute abnormality noted.   Electronically Signed   By: Inez Catalina M.D.   On: 08/06/2014 15:13   Ct Head Wo Contrast  08/06/2014   CLINICAL DATA:  Syncope, fall, on Eliquis  EXAM: CT HEAD WITHOUT CONTRAST  TECHNIQUE: Contiguous axial images were obtained from the base of the skull through the vertex without intravenous contrast.   COMPARISON:  None.  FINDINGS: No skull fracture is noted. Paranasal sinuses and mastoid air cells are unremarkable. No intracranial hemorrhage, mass effect or midline shift. No acute cortical infarction. Mild cerebral atrophy. Mild periventricular white matter decreased attenuation probable due to chronic small vessel ischemic changes. No mass lesion is noted on this unenhanced scan.  IMPRESSION: No acute intracranial abnormality. Mild cerebral atrophy. Mild periventricular chronic white matter disease.   Electronically Signed   By: Lahoma Crocker M.D.   On: 08/06/2014 16:29    EKG:   Orders placed or performed during the hospital encounter of 08/06/14  . EKG test  . EKG test    IMPRESSION AND PLAN:   Principal Problem:   GI bleeding Active Problems:   Anemia   Bradycardia   Atrial fibrillation   Hyponatremia   Chronic diastolic heart failure   Diabetes   OSA (obstructive sleep apnea)   Problem #1 GI bleeding: In the emergency room his stool is FOBT positive. He has not seen any bleeding. His hemoglobin is 2 g lower than it was in April of this year. He states that he has had a colonoscopy in the past but does not remember when or who performed the study or any of the results. He has a history of reflux and is on omeprazole at home. He is also on Eliquis and meloxicam. Will start twice a day IV PPI, consult gastroenterology and keep him on a clear diet, NPO after midnight. For now hold Eliquis. Hold NSAIDs.  Problem #2 symptomatic anemia, acute on chronic: He is short of breath fatigued and dizzy which could be due to this new anemia. I have ordered an anemia panel to assess for other potential causes of anemia. He will be transfused 1 unit. Will recheck CBC after this and every 6 hours to ensure stability. Goals to get his hemoglobin greater than 7. Looks like his baseline is around 8.5.  Problem #3 bradycardia: On presentation he is in normal sinus with a fairly wide QRS bradycardic in the  50s and 40s. Admit to telemetry. His shortness of breath dizziness and fatigue may be due to bradycardia. Hold Coreg for now and consult his cardiologist.  Problem #4 obstructive sleep apnea noncompliant with CPAP: Restart CPAP while inpatient.  Problem #5 chronic diastolic heart failure: He does have some peripheral edema, but on chest x-ray oh edema or vascular congestion. Does not seem to be having an exacerbation at this time. Continue home medications and give 20 mg IV Lasix after transfusion.  #6 hyponatremia: He has chronic hyponatremia as a result of his congestive heart failure. We will give Lasix today after transfusion. Continue to assess.   #7 chronic kidney disease stage III: He does have a small  decrease in his GFR today. Continue to monitor closely.  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 Check hemoglobin A1c  #9 generalized weakness with fall likely due to above mentioned medical concerns. Will also have physical therapy see the patient prior to discharge.  All the records are reviewed and case discussed with ED provider. Management plans discussed with the patient, family and they are in agreement.  CODE STATUS: Full. This was discussed with the patient and his son on admission. The decision maker for this patient will be his son Derek Shelton, though he does not have a formal POA or living will. I will consult social work to assist with this process as the family is interested in discussing.  TOTAL TIME TAKING CARE OF THIS PATIENT: 55 minutes.  Greater than 50% of time spent in coordination of care and counseling.  Myrtis Ser M.D on 08/06/2014 at 5:06 PM  Between 7am to 6pm - Pager - [phone]  After 6pm go to [email or web address] - password [email or web address]  [facility]  Office  [phone]  CC: Primary care physician; No primary care provider on file.

## 2014-08-07 LAB — CBC
HCT: 21.4 % — ABNORMAL LOW (ref 40.0–52.0)
HCT: 22 % — ABNORMAL LOW (ref 40.0–52.0)
HCT: 22.4 % — ABNORMAL LOW (ref 40.0–52.0)
Hemoglobin: 6.8 g/dL — ABNORMAL LOW (ref 13.0–18.0)
Hemoglobin: 7.2 g/dL — ABNORMAL LOW (ref 13.0–18.0)
Hemoglobin: 7.3 g/dL — ABNORMAL LOW (ref 13.0–18.0)
MCH: 23 pg — AB (ref 26.0–34.0)
MCH: 23.3 pg — AB (ref 26.0–34.0)
MCH: 23 pg — ABNORMAL LOW (ref 26.0–34.0)
MCHC: 31.9 g/dL — AB (ref 32.0–36.0)
MCHC: 32.7 g/dL (ref 32.0–36.0)
MCHC: 32.5 g/dL (ref 32.0–36.0)
MCV: 72 fL — ABNORMAL LOW (ref 80.0–100.0)
MCV: 71.2 fL — ABNORMAL LOW (ref 80.0–100.0)
MCV: 70.9 fL — AB (ref 80.0–100.0)
PLATELETS: 195 10*3/uL (ref 150–440)
PLATELETS: 205 10*3/uL (ref 150–440)
Platelets: 199 10*3/uL (ref 150–440)
RBC: 2.97 MIL/uL — ABNORMAL LOW (ref 4.40–5.90)
RBC: 3.08 MIL/uL — ABNORMAL LOW (ref 4.40–5.90)
RBC: 3.15 MIL/uL — ABNORMAL LOW (ref 4.40–5.90)
RDW: 20.3 % — ABNORMAL HIGH (ref 11.5–14.5)
RDW: 20 % — ABNORMAL HIGH (ref 11.5–14.5)
RDW: 20.2 % — AB (ref 11.5–14.5)
WBC: 5.5 10*3/uL (ref 3.8–10.6)
WBC: 5.9 10*3/uL (ref 3.8–10.6)
WBC: 5.5 10*3/uL (ref 3.8–10.6)

## 2014-08-07 LAB — BASIC METABOLIC PANEL
ANION GAP: 10 (ref 5–15)
BUN: 19 mg/dL (ref 6–20)
CO2: 29 mmol/L (ref 22–32)
Calcium: 8.5 mg/dL — ABNORMAL LOW (ref 8.9–10.3)
Chloride: 95 mmol/L — ABNORMAL LOW (ref 101–111)
Creatinine, Ser: 1.51 mg/dL — ABNORMAL HIGH (ref 0.61–1.24)
GFR, EST AFRICAN AMERICAN: 50 mL/min — AB (ref >60)
GFR, EST NON AFRICAN AMERICAN: 43 mL/min — AB (ref >60)
Glucose, Bld: 66 mg/dL (ref 65–99)
Potassium: 3.6 mmol/L (ref 3.5–5.1)
SODIUM: 134 mmol/L — AB (ref 135–145)

## 2014-08-07 LAB — GLUCOSE, CAPILLARY
GLUCOSE-CAPILLARY: 87 mg/dL (ref 65–99)
Glucose-Capillary: 75 mg/dL (ref 65–99)
Glucose-Capillary: 185 mg/dL — ABNORMAL HIGH (ref 65–99)
Glucose-Capillary: 104 mg/dL — ABNORMAL HIGH (ref 65–99)

## 2014-08-07 LAB — VITAMIN B12: VITAMIN B 12: 270 pg/mL (ref 180–914)

## 2014-08-07 LAB — HEMOGLOBIN A1C: HEMOGLOBIN A1C: 5.9 % (ref 4.0–6.0)

## 2014-08-07 MED ORDER — LEVOTHYROXINE SODIUM 100 MCG PO TABS
100.0000 ug | ORAL_TABLET | Freq: Every day | ORAL | Status: DC
  Administered 2014-08-08 – 2014-08-12 (×5): 100 ug via ORAL
  Filled 2014-08-07 (×5): qty 1

## 2014-08-07 MED ORDER — DULOXETINE HCL 60 MG PO CPEP
60.0000 mg | ORAL_CAPSULE | Freq: Every day | ORAL | Status: DC
  Administered 2014-08-07 – 2014-08-12 (×6): 60 mg via ORAL
  Filled 2014-08-07 (×6): qty 1

## 2014-08-07 MED ORDER — BUDESONIDE-FORMOTEROL FUMARATE 160-4.5 MCG/ACT IN AERO
2.0000 | INHALATION_SPRAY | Freq: Two times a day (BID) | RESPIRATORY_TRACT | Status: DC
  Administered 2014-08-07 – 2014-08-08 (×3): 2 via RESPIRATORY_TRACT
  Filled 2014-08-07: qty 6

## 2014-08-07 MED ORDER — AMLODIPINE BESYLATE 10 MG PO TABS
10.0000 mg | ORAL_TABLET | Freq: Every day | ORAL | Status: DC
  Administered 2014-08-07 – 2014-08-12 (×6): 10 mg via ORAL
  Filled 2014-08-07 (×6): qty 1

## 2014-08-07 MED ORDER — DEXTROSE-NACL 5-0.9 % IV SOLN
INTRAVENOUS | Status: DC
Start: 2014-08-07 — End: 2014-08-08
  Administered 2014-08-07 – 2014-08-08 (×2): via INTRAVENOUS

## 2014-08-07 NOTE — Consult Note (Signed)
GI Inpatient Consult Note  Reason for Consult: GI Bleed / Anemia / Heme + stool   Attending Requesting Consult: Dr. Volanda Napoleon  History of Present Illness: Derek Shelton is a 74 y.o. male reports he had a fall  2 days ago in his bedroom.  His wife was able to help him back to bed.  He denies any loss of consciousness.   Yesterday he complained of his head hurting and he was short of breath, so his family brought him to the emergency room.  He denies nausea, vomiting, abdominal pain.  He does have memory issues.  History was given by patient, 2 sons, and his wife.  In the emergency department it was noted that he had blood in his stool.  His hemoglobin level was 6.4.  He received a unit of blood.  He takes Eliquis for AFib.  His last dose was yesterday morning.  He has COPD and uses a CPAP machine at home, however patient is noncompliant with CPAP.   He takes Mobic for pain.  He reports excess belching and gas recently, uses Nexium on a daily basis which was prescribed by his primary care provider. Family reports they have noticed a change in his appetite recently, but had not noticed any weight changes.  His last colonoscopy was 2-3 years ago, family is unsure of exact date but  His records are through  [facility].  There are unsure of the results of the colonoscopy.   Past Medical History:  Past Medical History  Diagnosis Date  . Hypertension   . Sleep apnea   . Dementia     Per son's report  . Diabetes mellitus without complication   . Thyroid disease   . GERD (gastroesophageal reflux disease)   . Atrial fibrillation   . COPD (chronic obstructive pulmonary disease)   . CHF (congestive heart failure)   . Shortness of breath dyspnea   . Anemia     Problem List: Patient Active Problem List   Diagnosis Date Noted  . GI bleeding 08/06/2014  . Anemia 08/06/2014  . Bradycardia 08/06/2014  . Atrial fibrillation 08/06/2014  . Hyponatremia 08/06/2014  . Chronic diastolic heart  failure 20/25/4270  . Diabetes 08/06/2014  . OSA (obstructive sleep apnea) 08/06/2014    Past Surgical History: Past Surgical History  Procedure Laterality Date  . Cardiac catheterization    . Eye surgery    . Rectal fistula N/A     Allergies: No Known Allergies  Home Medications: Prescriptions prior to admission  Medication Sig Dispense Refill Last Dose  . amiodarone (PACERONE) 200 MG tablet Take 200 mg by mouth daily.     Marland Kitchen apixaban (ELIQUIS) 5 MG TABS tablet Take 5 mg by mouth 2 (two) times daily.     . benazepril (LOTENSIN) 10 MG tablet Take 10 mg by mouth daily.     . budesonide-formoterol (SYMBICORT) 160-4.5 MCG/ACT inhaler Inhale 1 puff into the lungs 2 (two) times daily.     . carvedilol (COREG) 3.125 MG tablet Take 3.125 mg by mouth 2 (two) times daily with a meal.     . donepezil (ARICEPT) 5 MG tablet Take 5 mg by mouth at bedtime.     . DULoxetine (CYMBALTA) 60 MG capsule Take 60 mg by mouth daily.     Marland Kitchen esomeprazole (NEXIUM) 40 MG capsule Take 40 mg by mouth daily at 12 noon.     . furosemide (LASIX) 20 MG tablet Take 20 mg by mouth 2 (two)  times daily.     Marland Kitchen glimepiride (AMARYL) 2 MG tablet Take 2 mg by mouth daily with breakfast.     . hydrALAZINE (APRESOLINE) 100 MG tablet Take 100 mg by mouth 3 (three) times daily.     . Melatonin 3 MG TABS Take by mouth.     . meloxicam (MOBIC) 15 MG tablet Take 15 mg by mouth daily.     Marland Kitchen omega-3 acid ethyl esters (LOVAZA) 1 G capsule Take by mouth 2 (two) times daily.     . Albuterol Sulfate 108 (90 BASE) MCG/ACT AEPB Inhale into the lungs.     . finasteride (PROSCAR) 5 MG tablet Take 5 mg by mouth daily.     Marland Kitchen levothyroxine (SYNTHROID, LEVOTHROID) 50 MCG tablet Take 50 mcg by mouth daily before breakfast.     . metFORMIN (GLUCOPHAGE) 1000 MG tablet Take 1,000 mg by mouth 2 (two) times daily with a meal.     . tiotropium (SPIRIVA) 18 MCG inhalation capsule Place 18 mcg into inhaler and inhale daily.     . traZODone (DESYREL) 100  MG tablet Take 100 mg by mouth at bedtime.      Home medication reconciliation was completed with the patient.   Scheduled Inpatient Medications:   . amiodarone  200 mg Oral Daily  . benazepril  40 mg Oral Daily  . budesonide-formoterol  2 puff Inhalation BID  . DULoxetine  60 mg Oral Daily  . finasteride  5 mg Oral Daily  . furosemide  20 mg Oral Daily  . hydrALAZINE  100 mg Oral BID  . insulin aspart  0-15 Units Subcutaneous TID WC  . insulin aspart  0-5 Units Subcutaneous QHS  . [START ON 08/08/2014] levothyroxine  100 mcg Oral QAC breakfast  . pantoprazole (PROTONIX) IV  40 mg Intravenous Q12H  . sodium chloride  3 mL Intravenous Q12H  . tiotropium  18 mcg Inhalation Daily    Continuous Inpatient Infusions:   . dextrose 5 % and 0.9% NaCl      PRN Inpatient Medications:  acetaminophen **OR** acetaminophen, albuterol, hydrALAZINE, ondansetron **OR** ondansetron (ZOFRAN) IV  Family History: family history includes Diabetes in his mother.  The patient's family history is negative for inflammatory bowel disorders, GI malignancy, or solid organ transplantation.  Social History:   reports that he has never smoked. His smokeless tobacco use includes Chew. He reports that he does not drink alcohol or use illicit drugs. The patient denies ETOH, tobacco, or drug use.   Review of Systems: Constitutional: Weight is stable.  Eyes: No changes in vision. ENT: No oral lesions, sore throat.  GI: see HPI.  Heme/Lymph: No easy bruising.  CV: No chest pain.  GU: No hematuria.  Integumentary: No rashes.   Psych: No depression/anxiety.  Endocrine: No heat/cold intolerance.  Allergic/Immunologic: No urticaria.  Resp: No cough.  Musculoskeletal: No joint swelling.    Physical Examination: BP 215/78 mmHg  Pulse 71  Temp(Src) 98.3 F (36.8 C) (Oral)  Resp 16  Ht 5\' 6"  (1.676 m)  Wt 94.938 kg (209 lb 4.8 oz)  BMI 33.80 kg/m2  SpO2 100% Gen: NAD, alert and oriented x 4.  Patient  gave history, 2 sons and wife also contributed and confirmed history. HEENT: PEERLA, EOMI, Neck: supple, no JVD or thyromegaly Chest: CTA bilaterally, no wheezes, crackles, or other adventitious sounds CV: RRR, no m/g/c/r Abd:   Right upper quadrant and epigastric tenderness, ND, +BS in all four quadrants; no HSM, guarding, ridigity, or  rebound tenderness Ext: no edema, well perfused with 2+ pulses, Skin: no rash or lesions noted Lymph: no LAD Rectal exam: Was unable to obtain any stool to test.  Data: Lab Results  Component Value Date   WBC 5.5 08/07/2014   HGB 7.3* 08/07/2014   HCT 22.4* 08/07/2014   MCV 70.9* 08/07/2014   PLT 205 08/07/2014    Recent Labs Lab 08/07/14 0204 08/07/14 0430 08/07/14 1032  HGB 6.8* 7.2* 7.3*   Lab Results  Component Value Date   NA 134* 08/07/2014   K 3.6 08/07/2014   CL 95* 08/07/2014   CO2 29 08/07/2014   BUN 19 08/07/2014   CREATININE 1.51* 08/07/2014   Lab Results  Component Value Date   ALT 16* 08/06/2014   AST 24 08/06/2014   ALKPHOS 60 08/06/2014   BILITOT 0.8 08/06/2014   No results for input(s): APTT, INR, PTT in the last 168 hours.   Assessment/Plan: Mr. Kats is a 74 y.o. male with anemia and heme positive stool Recommendations: We will schedule the patient for an upper endoscopy to be performed tomorrow.    Depending on results of upper endoscopy tomorrow, we may proceed with a colonoscopy on Monday.  Recommend repeating CBC tonight and again in the morning.   We agree with Protonix and continuing to hold Eliquis. Thank you for the consult. Please call with questions or concerns.  Cari S Richards,PA-C  I personally performed these services.

## 2014-08-07 NOTE — Progress Notes (Signed)
Spoke with Dr. Anselm Jungling regarding vital signs. Patient is no longer NPO and therefore can now take PO medications. Per MD, go ahead and give PRN IV hydralazine with daily medications missed this morning.  Derek Shelton

## 2014-08-07 NOTE — Clinical Social Work Note (Signed)
CSW acknowledges consult for Advanced Directives.  Consult was placed for chaplin CSW signing off

## 2014-08-07 NOTE — Care Management (Signed)
This CM was involved in setting up home cpap last admission in 4/16.  Have contacted Will with [facility] to determine if all went as planned (kept appointment for outpatient sleep study and now cpap covered by his medicare.)   It is documented that patient is noncompliant with home cpap use. Patient does not have home 02 for chronic use.  Discussed during progression of need to assess for need of home 02.  GI consult for possible gib as hemoglobin found to be 6.4 (2 gm lower than April 2016 results).  Receiving one unitpacked cells.

## 2014-08-07 NOTE — Progress Notes (Signed)
PT Cancellation Note  Patient Details Name: Derek Shelton MRN: [id number] DOB: 1940/11/18   Cancelled Treatment:    Reason Eval/Treat Not Completed: Patient not medically ready. Chart reviewed c most recent Hb noted to be 7.2. Will hold evaluation until Hb is >7.9 for utmost pt safety and tolerance. Will attempt evaluation at later date/time.    Buccola,Allan C 08/07/2014, 8:34 AM  8:35 AM  Etta Grandchild, PT, DPT [place] License # [id number]

## 2014-08-07 NOTE — Progress Notes (Signed)
Derek Shelton at [facility] NAME: Derek Shelton    MR#:  [id number]  DATE OF BIRTH:  06-03-1940  SUBJECTIVE:  CHIEF COMPLAINT:   Chief Complaint  Patient presents with  . Shortness of Breath    S/p 1 unit PRBC transfusion, little more strong today.  REVIEW OF SYSTEMS:  CONSTITUTIONAL: No fever, positive fatigue or weakness.  EYES: No blurred or double vision.  EARS, NOSE, AND THROAT: No tinnitus or ear pain.  RESPIRATORY: No cough, shortness of breath, wheezing or hemoptysis.  CARDIOVASCULAR: No chest pain, orthopnea, edema.  GASTROINTESTINAL: No nausea, vomiting, diarrhea or abdominal pain.  GENITOURINARY: No dysuria, hematuria.  ENDOCRINE: No polyuria, nocturia,  HEMATOLOGY: No anemia, easy bruising or bleeding SKIN: No rash or lesion. MUSCULOSKELETAL: No joint pain or arthritis.   NEUROLOGIC: No tingling, numbness, weakness.  PSYCHIATRY: No anxiety or depression.   ROS  DRUG ALLERGIES:  No Known Allergies  VITALS:  Blood pressure 215/78, pulse 71, temperature 98.3 F (36.8 C), temperature source Oral, resp. rate 16, height 5\' 6"  (1.676 m), weight 94.938 kg (209 lb 4.8 oz), SpO2 100 %.  PHYSICAL EXAMINATION:  GENERAL:  74 y.o.-year-old patient lying in the bed with no acute distress.  EYES: Pupils equal, round, reactive to light and accommodation. No scleral icterus. Extraocular muscles intact. Conjunctive pale. HEENT: Head atraumatic, normocephalic. Oropharynx and nasopharynx clear.  NECK:  Supple, no jugular venous distention. No thyroid enlargement, no tenderness.  LUNGS: Normal breath sounds bilaterally, no wheezing, rales,rhonchi or crepitation. No use of accessory muscles of respiration.  CARDIOVASCULAR: S1, S2 normal. No murmurs, rubs, or gallops.  ABDOMEN: Soft, nontender, nondistended. Bowel sounds present. No organomegaly or mass.  EXTREMITIES: No pedal edema, cyanosis, or clubbing.  NEUROLOGIC: Cranial nerves II  through XII are intact. Muscle strength 5/5 in all extremities. Sensation intact. Gait not checked.  PSYCHIATRIC: The patient is alert and oriented x 3.  SKIN: No obvious rash, lesion, or ulcer.   Physical Exam LABORATORY PANEL:   CBC  Recent Labs Lab 08/07/14 1032  WBC 5.5  HGB 7.3*  HCT 22.4*  PLT 205   ------------------------------------------------------------------------------------------------------------------  Chemistries   Recent Labs Lab 08/06/14 1436 08/07/14 0430  NA 129* 134*  K 4.1 3.6  CL 96* 95*  CO2 27 29  GLUCOSE 167* 66  BUN 22* 19  CREATININE 1.56* 1.51*  CALCIUM 8.2* 8.5*  AST 24  --   ALT 16*  --   ALKPHOS 60  --   BILITOT 0.8  --    ------------------------------------------------------------------------------------------------------------------  Cardiac Enzymes  Recent Labs Lab 08/06/14 1436  TROPONINI 0.03   ------------------------------------------------------------------------------------------------------------------  RADIOLOGY:  Dg Chest 2 View  08/06/2014   CLINICAL DATA:  Shortness of breath for 2 days  EXAM: CHEST - 2 VIEW  COMPARISON:  05/06/2014  FINDINGS: Cardiac shadow remains enlarged. The patchy infiltrates seen on the prior exam has resolved in the interval. No focal infiltrate or effusion is seen. No significant vascular congestion is noted. No acute bony abnormality seen.  IMPRESSION: Stable cardiomegaly.  No acute abnormality noted.   Electronically Signed   By: Inez Catalina M.D.   On: 08/06/2014 15:13   Ct Head Wo Contrast  08/06/2014   CLINICAL DATA:  Syncope, fall, on Eliquis  EXAM: CT HEAD WITHOUT CONTRAST  TECHNIQUE: Contiguous axial images were obtained from the base of the skull through the vertex without intravenous contrast.  COMPARISON:  None.  FINDINGS: No skull fracture is  noted. Paranasal sinuses and mastoid air cells are unremarkable. No intracranial hemorrhage, mass effect or midline shift. No acute  cortical infarction. Mild cerebral atrophy. Mild periventricular white matter decreased attenuation probable due to chronic small vessel ischemic changes. No mass lesion is noted on this unenhanced scan.  IMPRESSION: No acute intracranial abnormality. Mild cerebral atrophy. Mild periventricular chronic white matter disease.   Electronically Signed   By: Lahoma Crocker M.D.   On: 08/06/2014 16:29    ASSESSMENT AND PLAN:    #1 GI bleeding:   stool is FOBT positive.  had a colonoscopy in the past but does not remember when or who performed the study or any of the results.   He has a history of reflux and is on omeprazole at home.   He is also on Eliquis and meloxicam.    twice a day IV PPI, consult gastroenterology and keep him on a clear diet,   For now hold Eliquis. Hold NSAIDs.   #2 symptomatic anemia, acute on chronic:  He is short of breath fatigued and dizzy which could be due to this new anemia.   anemia panel to assess for other potential causes of anemia.  transfused 1 unit.    #3 bradycardia: On presentation he is in normal sinus with a fairly wide QRS bradycardic, with some first degree block, HR  50s and 40s.   telemetry. His shortness of breath dizziness and fatigue may be due to bradycardia.   Hold Coreg for now and consult his cardiologist.   #4 obstructive sleep apnea noncompliant with CPAP: Restart CPAP while inpatient.   #5 chronic diastolic heart failure:   does have some peripheral edema, but on chest x-ray no edema or vascular congestion. Does not seem to be having an exacerbation at this time. Continue home medications .  #6 hyponatremia: He has chronic hyponatremia as a result of his congestive heart failure.   Continue to assess.   #7 chronic kidney disease stage III: He does have a small decrease in his GFR. Continue to monitor closely.  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  Check hemoglobin A1c  #9 generalized weakness with fall likely due to above mentioned medical concerns. Will also have physical therapy see the patient prior to discharge.  #10 Hypothyroidism    TSH is high- increase Thyroxin from 50 to 100. All the records are reviewed and case discussed with Care Management/Social Workerr. Management plans discussed with the patient, family and they are in agreement.  CODE STATUS: full  TOTAL TIME TAKING CARE OF THIS PATIENT: 35 minutes.   More than 50% of the visit was spent in counseling/coordination of care  POSSIBLE D/C IN 1-2 DAYS, DEPENDING ON CLINICAL CONDITION.   Vaughan Basta M.D on 08/07/2014   Between 7am to 6pm - Pager - [phone]  After 6pm go to [email or web address] - password [email or web address] [facility]  [facility]  Office  [phone]  CC: Primary care physician; No primary care provider on file.

## 2014-08-07 NOTE — Progress Notes (Signed)
Dr. Anselm Jungling on unit now. Notified of patient's complaints of hunger and wanting to know what the plan is. MD to assess and place orders as needed.  Toniann Ket

## 2014-08-07 NOTE — Evaluation (Signed)
Physical Therapy Evaluation Patient Details Name: Derek Shelton MRN: [id number] DOB: Feb 26, 1940 Today's Date: 08/07/2014   History of Present Illness  Derek Siddiquiis a 74 yo Derek Shelton male (speaks Urdu) with a known history of obstructive sleep apnea and not compliant with CPAP, chronic diastolic congestive heart failure, obesity, paroxysmal atrial fibrillation on Eliquis, diabetes and hypertension presents with several days of progressive shortness of breath and fatigue. Yesterday he fell while walking to his bedroom and his wife found him on the floor, she was able to get him up to the bed and he rested and felt slightly better. This morning he continued to be very short of breath and dizzy and was brought to the emergency room by his family. On presentation he is found to be bradycardic in the 40s and 50s and also anemic with a hemoglobin of 6.4. He denies nausea vomiting abdominal pain melanoma or hematochezia. Denies palpitations, diaphoresis or syncope or chest pain. The history is provided by his son as the has dementia and is a poor historian.  Clinical Impression  Pt is received semirecumbent in bed upon entry, awake, alert, and willing to participate, c wife in room. No acute distress noted. Pt is noted to have Hb:7.2, however, RN reports that pt has been mobilizing s c/o anemia-consistent symptoms. Pt is a native Urdu speaker, hence eval is conducted through interpreter Derek Shelton [phone], and history is assisted from wife. Pt is pleasant c/o mostly of hunger pangs associated with being NPO for about 12 hours now. Pt reports zero falls in the last 6 months. Pt strength as screened by functional mobility assessment presents with mild weakness AEB 5xSTS:43. Pt is bradycardic per telemetry at rest, however increase in HR is WNL 51bpm-->71bpm during ambulation in hallway. Patient presenting with impairment of strength,  and activity tolerance, limiting ability to perform ADL and mobility tasks at  baseline level of function. Patient will benefit from skilled intervention to address the above impairments and limitations, in order to restore to prior level of function, improve patient safety upon discharge, and to decrease falls risk.       Follow Up Recommendations Home health PT    Equipment Recommendations  None recommended by PT    Recommendations for Other Services       Precautions / Restrictions Precautions Precautions: None Restrictions Weight Bearing Restrictions: No      Mobility  Bed Mobility Overal bed mobility: Independent                Transfers Overall transfer level: Independent Equipment used: None             General transfer comment: 5x STS in 19s, pt reports no change from baseline; simply feels tired/sleepy  Ambulation/Gait Ambulation/Gait assistance: Supervision Ambulation Distance (Feet): 160 Feet Assistive device: Straight cane Gait Pattern/deviations: WFL(Within Functional Limits) Gait velocity: 0.42m/s  Gait velocity interpretation: >2.62 ft/sec, indicative of independent community ambulator General Gait Details: Demonstrates good sequencing c SPC in LUE s verbal cues.   Stairs            Wheelchair Mobility    Modified Rankin (Stroke Patients Only)       Balance Overall balance assessment: No apparent balance deficits (not formally assessed)                                           Pertinent Vitals/Pain Pain  Assessment: No/denies pain    Home Living Family/patient expects to be discharged to:: Private residence Living Arrangements: Spouse/significant other;Children Available Help at Discharge: Family Type of Home: House Home Access: Stairs to enter Entrance Stairs-Rails: Psychiatric nurse of Steps: 8 steps c landing in between Home Layout: One level Home Equipment: [place] - single point      Prior Function Level of Independence: Independent with assistive device(s)          Comments: Limitied community distances, mostly household ambulation.     Hand Dominance   Dominant Hand: Left    Extremity/Trunk Assessment   Upper Extremity Assessment: Overall WFL for tasks assessed           Lower Extremity Assessment: Generalized weakness (5xSTS: 19s (likely at baseline) )      Cervical / Trunk Assessment: Normal  Communication   Communication: Interpreter utilized Derek Shelton [phone])  Cognition Arousal/Alertness: Awake/alert Behavior During Therapy: WFL for tasks assessed/performed Overall Cognitive Status: Within Functional Limits for tasks assessed                      General Comments      Exercises        Assessment/Plan    PT Assessment Patient needs continued PT services  PT Diagnosis Generalized weakness   PT Problem List Decreased strength;Decreased activity tolerance  PT Treatment Interventions Stair training;Gait training;Functional mobility training;Therapeutic exercise;Therapeutic activities   PT Goals (Current goals can be found in the Care Plan section) Acute Rehab PT Goals Patient Stated Goal: Pt would like regain strength, reduce pain, and improve his ability to walk.  PT Goal Formulation: With patient/family Time For Goal Achievement: 08-25-14 Potential to Achieve Goals: Good    Frequency Min 2X/week   Barriers to discharge   8 stairs at entry.     Co-evaluation               End of Session Equipment Utilized During Treatment: Gait belt Activity Tolerance: Patient tolerated treatment well;No increased pain Patient left: in bed;with call bell/phone within reach;with nursing/sitter in room           Time: 1111-1136 PT Time Calculation (min) (ACUTE ONLY): 25 min   Charges:         PT G Codes:        Elisabel Hanover C 08-25-2014, 12:33 PM  12:39 PM  Etta Grandchild, PT, DPT [place] License # [id number]

## 2014-08-07 NOTE — Progress Notes (Signed)
   08/07/14 1100  Clinical Encounter Type  Visited With Patient and family together  Visit Type Initial  Referral From Nurse  Consult/Referral To Chaplain  Stress Factors  Patient Stress Factors Exhausted;Family relationships;Health changes;Loss of control  Family Stress Factors Family relationships;Health changes;Major life changes  Advance Directives (For Healthcare)  Does patient have an advance directive? No  Would patient like information on creating an advanced directive? Yes - Scientist, clinical (histocompatibility and immunogenetics) given  Met w/patient & family. Patient's ability is questionable to comprehend the details of Health POA/Living Will. Family was provided the materials and advised to discuss and call for chaplain to follow-up. However, they were apprehensive of the need for this action. Order was closed. Chap. Alyssah Algeo G. Ambra Haverstick, ext.[phone]

## 2014-08-07 NOTE — Progress Notes (Signed)
Tolerated clear liquids before Midnight.  Understanding voiced for nothing by mouth after midnight.  Blood transfusion completed without complications.  No nausea or stools this shift.  Wife at bedside this shift.  Tawni Millers, RN

## 2014-08-07 NOTE — Consult Note (Signed)
[facility] Consultation Note  Patient ID: TARYN NAVE, MRN: [id number], DOB/AGE: 1940/10/18 74 y.o. Admit date: 08/06/2014   Date of Consult: 08/07/2014 Primary Physician: No primary care provider on file. Primary Cardiologist: Humphrey Rolls   Chief Complaint:  Chief Complaint  Patient presents with  . Shortness of Breath   Reason for Consult: shortness of breath syncope with significant anemia known atrial fibrillation with bradycardia  HPI: 73 y.o. male with known history of atrial fibrillation currently being maintained with normal sinus rhythm on amiodarone and carvedilol. The patient had a significant episode of syncope for which she passed out and felt weak and fatigued. When he was seen in the emergency room he was in sinus bradycardia at 50 bpm. This is most consistent with medication use. The patient was also found to have significant anemia but no apparent significant GI bleed at the time with no evidence of melanoma and or right red blood. The patient since then has felt slightly better with telemetry showing sinus bradycardia but significant hypertension without the use of the carvedilol at this time. The patient does have a history of chronic diastolic dysfunction heart failure and is likely causing some of his shortness of breath but currently no evidence of pulmonary edema. He also has had sleep apnea with hypoxia which is contributing to above. Currently he is feeling somewhat better at this time and more stable  Past Medical History  Diagnosis Date  . Hypertension   . Sleep apnea   . Dementia     Per son's report  . Diabetes mellitus without complication   . Thyroid disease   . GERD (gastroesophageal reflux disease)   . Atrial fibrillation   . COPD (chronic obstructive pulmonary disease)   . CHF (congestive heart failure)   . Shortness of breath dyspnea   . Anemia       Surgical History:  Past Surgical History  Procedure Laterality Date  . Cardiac  catheterization    . Eye surgery    . Rectal fistula N/A      Home Meds: Prior to Admission medications   Medication Sig Start Date End Date Taking? Authorizing Provider  amiodarone (PACERONE) 200 MG tablet Take 200 mg by mouth daily.   Yes Historical Provider, MD  apixaban (ELIQUIS) 5 MG TABS tablet Take 5 mg by mouth 2 (two) times daily.   Yes Historical Provider, MD  benazepril (LOTENSIN) 10 MG tablet Take 10 mg by mouth daily.   Yes Historical Provider, MD  budesonide-formoterol (SYMBICORT) 160-4.5 MCG/ACT inhaler Inhale 1 puff into the lungs 2 (two) times daily.   Yes Historical Provider, MD  carvedilol (COREG) 3.125 MG tablet Take 3.125 mg by mouth 2 (two) times daily with a meal.   Yes Historical Provider, MD  donepezil (ARICEPT) 5 MG tablet Take 5 mg by mouth at bedtime.   Yes Historical Provider, MD  DULoxetine (CYMBALTA) 60 MG capsule Take 60 mg by mouth daily.   Yes Historical Provider, MD  esomeprazole (NEXIUM) 40 MG capsule Take 40 mg by mouth daily at 12 noon.   Yes Historical Provider, MD  furosemide (LASIX) 20 MG tablet Take 20 mg by mouth 2 (two) times daily.   Yes Historical Provider, MD  glimepiride (AMARYL) 2 MG tablet Take 2 mg by mouth daily with breakfast.   Yes Historical Provider, MD  hydrALAZINE (APRESOLINE) 100 MG tablet Take 100 mg by mouth 3 (three) times daily.   Yes Historical Provider, MD  Melatonin 3 MG  TABS Take by mouth.   Yes Historical Provider, MD  meloxicam (MOBIC) 15 MG tablet Take 15 mg by mouth daily.   Yes Historical Provider, MD  omega-3 acid ethyl esters (LOVAZA) 1 G capsule Take by mouth 2 (two) times daily.   Yes Historical Provider, MD  Albuterol Sulfate 108 (90 BASE) MCG/ACT AEPB Inhale into the lungs.    Historical Provider, MD  finasteride (PROSCAR) 5 MG tablet Take 5 mg by mouth daily.    Historical Provider, MD  levothyroxine (SYNTHROID, LEVOTHROID) 50 MCG tablet Take 50 mcg by mouth daily before breakfast.    Historical Provider, MD   metFORMIN (GLUCOPHAGE) 1000 MG tablet Take 1,000 mg by mouth 2 (two) times daily with a meal.    Historical Provider, MD  tiotropium (SPIRIVA) 18 MCG inhalation capsule Place 18 mcg into inhaler and inhale daily.    Historical Provider, MD  traZODone (DESYREL) 100 MG tablet Take 100 mg by mouth at bedtime.    Historical Provider, MD    Inpatient Medications:  . amiodarone  200 mg Oral Daily  . benazepril  40 mg Oral Daily  . budesonide-formoterol  2 puff Inhalation BID  . DULoxetine  60 mg Oral Daily  . finasteride  5 mg Oral Daily  . furosemide  20 mg Oral Daily  . hydrALAZINE  100 mg Oral BID  . insulin aspart  0-15 Units Subcutaneous TID WC  . insulin aspart  0-5 Units Subcutaneous QHS  . [START ON 08/08/2014] levothyroxine  100 mcg Oral QAC breakfast  . pantoprazole (PROTONIX) IV  40 mg Intravenous Q12H  . sodium chloride  3 mL Intravenous Q12H  . tiotropium  18 mcg Inhalation Daily   . dextrose 5 % and 0.9% NaCl 50 mL/hr at 08/07/14 1446    Allergies: No Known Allergies  History   Social History  . Marital Status: Married    Spouse Name: N/A  . Number of Children: N/A  . Years of Education: N/A   Occupational History  . Not on file.   Social History Main Topics  . Smoking status: Never Smoker   . Smokeless tobacco: Current User    Types: Chew  . Alcohol Use: No  . Drug Use: No  . Sexual Activity: Not on file   Other Topics Concern  . Not on file   Social History Narrative     Family History  Problem Relation Age of Onset  . Diabetes Mother      Review of Systems Positive for Jaquelyn Bitter of breath Negative for: General:  chills, fever, night sweats or weight changes.  Cardiovascular: PND orthopnea syncope dizziness  Dermatological skin lesions rashes Respiratory: Cough congestion Urologic: Frequent urination urination at night and hematuria Abdominal: negative for nausea, vomiting, diarrhea, bright red blood per rectum, melena, or  hematemesis Neurologic: negative for visual changes, and/or hearing changes  All other systems reviewed and are otherwise negative except as noted above.  Labs:  Recent Labs  08/06/14 1436  TROPONINI 0.03   Lab Results  Component Value Date   WBC 5.5 08/07/2014   HGB 7.3* 08/07/2014   HCT 22.4* 08/07/2014   MCV 70.9* 08/07/2014   PLT 205 08/07/2014    Recent Labs Lab 08/06/14 1436 08/07/14 0430  NA 129* 134*  K 4.1 3.6  CL 96* 95*  CO2 27 29  BUN 22* 19  CREATININE 1.56* 1.51*  CALCIUM 8.2* 8.5*  PROT 7.3  --   BILITOT 0.8  --   [employer]  60  --   ALT 16*  --   AST 24  --   GLUCOSE 167* 66   No results found for: CHOL, HDL, LDLCALC, TRIG No results found for: DDIMER  Radiology/Studies:  Dg Chest 2 View  08/06/2014   CLINICAL DATA:  Shortness of breath for 2 days  EXAM: CHEST - 2 VIEW  COMPARISON:  05/06/2014  FINDINGS: Cardiac shadow remains enlarged. The patchy infiltrates seen on the prior exam has resolved in the interval. No focal infiltrate or effusion is seen. No significant vascular congestion is noted. No acute bony abnormality seen.  IMPRESSION: Stable cardiomegaly.  No acute abnormality noted.   Electronically Signed   By: Inez Catalina M.D.   On: 08/06/2014 15:13   Ct Head Wo Contrast  08/06/2014   CLINICAL DATA:  Syncope, fall, on Eliquis  EXAM: CT HEAD WITHOUT CONTRAST  TECHNIQUE: Contiguous axial images were obtained from the base of the skull through the vertex without intravenous contrast.  COMPARISON:  None.  FINDINGS: No skull fracture is noted. Paranasal sinuses and mastoid air cells are unremarkable. No intracranial hemorrhage, mass effect or midline shift. No acute cortical infarction. Mild cerebral atrophy. Mild periventricular white matter decreased attenuation probable due to chronic small vessel ischemic changes. No mass lesion is noted on this unenhanced scan.  IMPRESSION: No acute intracranial abnormality. Mild cerebral atrophy. Mild  periventricular chronic white matter disease.   Electronically Signed   By: Lahoma Crocker M.D.   On: 08/06/2014 16:29    EKG: Sinus bradycardia with right bundle branch block  Weights: Filed Weights   08/06/14 1414 08/06/14 1831 08/07/14 0444  Weight: 220 lb (99.791 kg) 204 lb 6.4 oz (92.715 kg) 209 lb 4.8 oz (94.938 kg)     Physical Exam: Blood pressure 133/62, pulse 54, temperature 98.3 F (36.8 C), temperature source Oral, resp. rate 16, height 5\' 6"  (1.676 m), weight 209 lb 4.8 oz (94.938 kg), SpO2 100 %. Body mass index is 33.8 kg/(m^2). General: Well developed, well nourished, in no acute distress. Head eyes ears nose throat: Normocephalic, atraumatic, sclera non-icteric, no xanthomas, nares are without discharge. No apparent thyromegaly and/or mass  Lungs: Normal respiratory effort.  no wheezes, no rales, no rhonchi.  Heart: RRR with normal S1 S2. no murmur gallop, no rub, PMI is normal size and placement, carotid upstroke normal without bruit, jugular venous pressure is normal Abdomen: Soft, non-tender, non-distended with normoactive bowel sounds. No hepatomegaly. No rebound/guarding. No obvious abdominal masses. Abdominal aorta is normal size without bruit Extremities: No edema. no cyanosis, no clubbing, no ulcers  Peripheral : 2+ bilateral upper extremity pulses, 2+ bilateral femoral pulses, 2+ bilateral dorsal pedal pulse Neuro: Alert and oriented. No facial asymmetry. No focal deficit. Moves all extremities spontaneously. Musculoskeletal: Normal muscle tone without kyphosis Psych:  Responds to questions appropriately with a normal affect.    Assessment: 74 year old male with known history of nonvalvular paroxysmal atrial fibrillation sleep apnea chronic diastolic dysfunction heart failure diabetes with complication hypertension with an acute episode of syncope most consistent with bradycardia and anemia and no current evidence of myocardial infarction  Plan: 1. Discontinuation  of anticoagulation 2. Discontinuation of carvedilol and beta blocker 3. Continue amiodarone for maintenance of normal sinus rhythm 4. Further gastric workup with upper endoscopy without restriction due to no current evidence of significant myocardial infarction or exacerbation of congestive heart failure currently at low risk for this procedure's l 5. Hypertension medication management including possible calcium channel blocker and/or ACE  inhibitor if able hypertension control 6. Treatment of hypoxia and sleep apnea with CPAP machine  Signed, Corey Skains M.D. [facility] 08/07/2014, 5:44 PM

## 2014-08-07 NOTE — Progress Notes (Signed)
Spoke with Dr. Tiffany Kocher regarding plan. Patient and family are concerned pt will get dehydrated and really want him to be able to eat something if no tests will be done today. Pt. Seen by Dr. Edd Fabian PA - no note at this time. Dr. Tiffany Kocher updated on patient's current condition and requests to either be able to eat or have IV fluids to prevent dehydration. Per Dr. Tiffany Kocher, if any tests need to be done, it will likely be tomorrow; therefore, go ahead and order clear liquid diet for today. MD to assess and place other appropriate orders later today if needed.   Toniann Ket

## 2014-08-07 NOTE — Progress Notes (Signed)
Used Associate Professor (intepreter ID # [id number]) for patient complaint of epigastric pain 2-3/10 - states he is hungry and thinks that is why. Does not request medications, no other distress, no changes on telemetry. Will continue to monitor. Patient working with PT now. No complaints.  Toniann Ket

## 2014-08-07 NOTE — Consult Note (Signed)
Pt seen with PA, heme positive stool and anemia.  Sleep apnea, chronic diastolic CHF HTN, at fib, diabetes.  Was on Eliquis and meloxicam.  Plan to do EGD tomorrow later in day after cardiology consult.

## 2014-08-07 NOTE — Plan of Care (Signed)
Problem: Acute Rehab PT Goals(only PT should resolve) Goal: Pt Will Ambulate Pt will ambulate with SPC at Supervision using a step-through pattern and equal step length for distances greater than 279ft to demonstrate the ability to perform safe household distance ambulation at discharge.    Goal: Pt Will Go Up/Down Stairs Pt will ascend/descend 8 stairs with SPC and 1 HR at Supervision to demonstrate safe entry/exit of home.

## 2014-08-07 NOTE — Progress Notes (Signed)
Patient resting quietly most of the day. Family at bedside. Possible GI tests tomorrow. Tolerating clear liquids at this time.  Toniann Ket

## 2014-08-08 ENCOUNTER — Encounter
Admission: EM | Disposition: A | Discharge status: Home / Self Care | Payer: Self-pay | Source: Home / Self Care | Attending: Internal Medicine

## 2014-08-08 ENCOUNTER — Inpatient Hospital Stay: Payer: Medicare Other | Admitting: Anesthesiology

## 2014-08-08 HISTORY — PX: ESOPHAGOGASTRODUODENOSCOPY: SHX5428

## 2014-08-08 LAB — CBC
HCT: 22.3 % — ABNORMAL LOW (ref 40.0–52.0)
HEMOGLOBIN: 7.1 g/dL — AB (ref 13.0–18.0)
MCH: 22.8 pg — ABNORMAL LOW (ref 26.0–34.0)
MCHC: 31.8 g/dL — ABNORMAL LOW (ref 32.0–36.0)
MCV: 71.7 fL — ABNORMAL LOW (ref 80.0–100.0)
Platelets: 218 10*3/uL (ref 150–440)
RBC: 3.1 MIL/uL — ABNORMAL LOW (ref 4.40–5.90)
RDW: 20 % — AB (ref 11.5–14.5)
WBC: 6 10*3/uL (ref 3.8–10.6)

## 2014-08-08 LAB — BASIC METABOLIC PANEL
Anion gap: 10 (ref 5–15)
BUN: 16 mg/dL (ref 6–20)
CHLORIDE: 94 mmol/L — AB (ref 101–111)
CO2: 29 mmol/L (ref 22–32)
CREATININE: 1.52 mg/dL — AB (ref 0.61–1.24)
Calcium: 8.5 mg/dL — ABNORMAL LOW (ref 8.9–10.3)
GFR calc Af Amer: 50 mL/min — ABNORMAL LOW (ref >60)
GFR calc non Af Amer: 43 mL/min — ABNORMAL LOW (ref >60)
Glucose, Bld: 99 mg/dL (ref 65–99)
Potassium: 3.6 mmol/L (ref 3.5–5.1)
SODIUM: 133 mmol/L — AB (ref 135–145)

## 2014-08-08 LAB — GLUCOSE, CAPILLARY
GLUCOSE-CAPILLARY: 126 mg/dL — AB (ref 65–99)
GLUCOSE-CAPILLARY: 119 mg/dL — AB (ref 65–99)
Glucose-Capillary: 144 mg/dL — ABNORMAL HIGH (ref 65–99)
Glucose-Capillary: 168 mg/dL — ABNORMAL HIGH (ref 65–99)

## 2014-08-08 LAB — PREPARE RBC (CROSSMATCH)

## 2014-08-08 SURGERY — EGD (ESOPHAGOGASTRODUODENOSCOPY)
Anesthesia: General

## 2014-08-08 MED ORDER — MIDAZOLAM HCL 5 MG/5ML IJ SOLN
INTRAMUSCULAR | Status: DC | PRN
  Administered 2014-08-08: 1 mg via INTRAVENOUS

## 2014-08-08 MED ORDER — PROPOFOL 10 MG/ML IV BOLUS
INTRAVENOUS | Status: DC | PRN
  Administered 2014-08-08: 50 mg via INTRAVENOUS

## 2014-08-08 MED ORDER — SODIUM CHLORIDE 0.9 % IV SOLN
Freq: Once | INTRAVENOUS | Status: AC
  Administered 2014-08-10: 11:00:00 via INTRAVENOUS

## 2014-08-08 MED ORDER — BENAZEPRIL HCL 20 MG PO TABS
10.0000 mg | ORAL_TABLET | Freq: Every day | ORAL | Status: DC
Start: 2014-08-09 — End: 2014-08-12
  Administered 2014-08-09: 50 mg via ORAL
  Administered 2014-08-10: 40 mg via ORAL
  Administered 2014-08-12: 10 mg via ORAL
  Filled 2014-08-08: qty 2
  Filled 2014-08-08 (×3): qty 1
  Filled 2014-08-08: qty 2

## 2014-08-08 MED ORDER — HYDRALAZINE HCL 100 MG PO TABS: 100.0000 mg | ORAL_TABLET | Freq: Three times a day (TID) | ORAL | Status: DC

## 2014-08-08 MED ORDER — FUROSEMIDE 20 MG PO TABS
20.0000 mg | ORAL_TABLET | Freq: Two times a day (BID) | ORAL | Status: DC
  Administered 2014-08-09 – 2014-08-12 (×6): 20 mg via ORAL
  Filled 2014-08-08 (×7): qty 1

## 2014-08-08 MED ORDER — DONEPEZIL HCL 5 MG PO TABS
5.0000 mg | ORAL_TABLET | Freq: Every day | ORAL | Status: DC
  Administered 2014-08-09 – 2014-08-11 (×3): 5 mg via ORAL
  Filled 2014-08-08 (×3): qty 1

## 2014-08-08 MED ORDER — PROPOFOL INFUSION 10 MG/ML OPTIME
INTRAVENOUS | Status: DC | PRN
  Administered 2014-08-08: 140 ug/kg/min via INTRAVENOUS

## 2014-08-08 MED ORDER — FENTANYL CITRATE (PF) 100 MCG/2ML IJ SOLN
INTRAMUSCULAR | Status: DC | PRN
  Administered 2014-08-08: 1 ug via INTRAVENOUS

## 2014-08-08 MED ORDER — BUTAMBEN-TETRACAINE-BENZOCAINE 2-2-14 % EX AERO
INHALATION_SPRAY | CUTANEOUS | Status: DC | PRN
  Administered 2014-08-08: 1 via TOPICAL

## 2014-08-08 MED ORDER — BUDESONIDE-FORMOTEROL FUMARATE 160-4.5 MCG/ACT IN AERO
1.0000 | INHALATION_SPRAY | Freq: Two times a day (BID) | RESPIRATORY_TRACT | Status: DC
Start: 2014-08-09 — End: 2014-08-12
  Administered 2014-08-09 – 2014-08-12 (×6): 1 via RESPIRATORY_TRACT
  Filled 2014-08-08: qty 6

## 2014-08-08 NOTE — Anesthesia Preprocedure Evaluation (Addendum)
Anesthesia Evaluation    Reviewed: Allergy & Precautions, NPO status , Patient's Chart, lab work & pertinent test results  History of Anesthesia Complications Negative for: history of anesthetic complications  Airway Mallampati: III  TM Distance: >3 FB Neck ROM: Full    Dental  (+) Poor Dentition, Chipped   Pulmonary shortness of breath, sleep apnea , COPD COPD inhaler, Current Smoker (rarely smokes, chews tobacco),          Cardiovascular hypertension, Pt. on medications and Pt. on home beta blockers +CHF (hx)     Neuro/Psych    GI/Hepatic GERD-  Medicated and Controlled,  Endo/Other  diabetes, Type 2, Oral Hypoglycemic Agents  Renal/GU      Musculoskeletal   Abdominal   Peds  Hematology  (+) anemia ,   Anesthesia Other Findings   Reproductive/Obstetrics                           Anesthesia Physical Anesthesia Plan  ASA: III  Anesthesia Plan: General   Post-op Pain Management:    Induction: Intravenous  Airway Management Planned: Nasal Cannula  Additional Equipment:   Intra-op Plan:   Post-operative Plan:   Informed Consent: I have reviewed the patients History and Physical, chart, labs and discussed the procedure including the risks, benefits and alternatives for the proposed anesthesia with the patient or authorized representative who has indicated his/her understanding and acceptance.     Plan Discussed with:   Anesthesia Plan Comments:        Anesthesia Quick Evaluation

## 2014-08-08 NOTE — Care Management Important Message (Signed)
Important Message  Patient Details  Name: Derek Shelton MRN: [id number] Date of Birth: 06-11-1940   Medicare Important Message Given:  Yes-second notification given    Juliann Pulse A Allmond 08/08/2014, 10:40 AM

## 2014-08-08 NOTE — Consult Note (Signed)
Due to age, cardiac problems of at fib and bradycardia and syncopal episode before admission and current hgb of 7.1 I strongly recommend transfuse one unit of blood before any endoscopic procedure be done today.  Will contact hospital doctor about recommendation.  Guidelines of below 7 for transfusion are guidelines and subject to clinician judgement to do otherwise.

## 2014-08-08 NOTE — Progress Notes (Signed)
[facility] Encounter Note  Patient: Derek Shelton / Admit Date: 08/06/2014 / Date of Encounter: 08/08/2014, 6:15 PM   Subjective: Resting comfortably this morning with no evidence of chest pain or other cardiovascular symptoms  Review of Systems: Positive for: Mild shortness of breath Negative for: Vision change, hearing change, syncope, dizziness, nausea, vomiting,diarrhea, bloody stool, stomach pain, cough, congestion, diaphoresis, urinary frequency, urinary pain,skin lesions, skin rashes Others previously listed  Objective: Telemetry: Atrial fibrillation Physical Exam: Blood pressure 134/113, pulse 52, temperature 98 F (36.7 C), temperature source Axillary, resp. rate 17, height 5\' 6"  (1.676 m), weight 209 lb 4.8 oz (94.938 kg), SpO2 96 %. Body mass index is 33.8 kg/(m^2). General: Well developed, well nourished, in no acute distress. Head: Normocephalic, atraumatic, sclera non-icteric, no xanthomas, nares are without discharge. Neck: No apparent masses Lungs: Normal respirations with diffuse wheezes, no rhonchi, no rales , few basilar crackles   Heart: Irregular rate and rhythm, normal S1 S2, no murmur, no rub, no gallop, PMI is normal size and placement, carotid upstroke normal without bruit, jugular venous pressure normal Abdomen: Soft, non-tender, non-distended with normoactive bowel sounds. No hepatosplenomegaly. Abdominal aorta is normal size without bruit Extremities: Trace edema, no clubbing, no cyanosis, no ulcers,  Peripheral: 2+ radial, 2+ femoral, 2+ dorsal pedal pulses Neuro: Alert and oriented. Moves all extremities spontaneously. Psych:  Responds to questions appropriately with a normal affect.   Intake/Output Summary (Last 24 hours) at 08/08/14 1815 Last data filed at 08/08/14 1600  Gross per 24 hour  Intake 1638.34 ml  Output   2475 ml  Net -836.66 ml    Inpatient Medications:  . sodium chloride   Intravenous Once  . amiodarone   200 mg Oral Daily  . amLODipine  10 mg Oral Daily  . benazepril  40 mg Oral Daily  . budesonide-formoterol  2 puff Inhalation BID  . DULoxetine  60 mg Oral Daily  . finasteride  5 mg Oral Daily  . furosemide  20 mg Oral Daily  . hydrALAZINE  100 mg Oral BID  . insulin aspart  0-15 Units Subcutaneous TID WC  . insulin aspart  0-5 Units Subcutaneous QHS  . levothyroxine  100 mcg Oral QAC breakfast  . pantoprazole (PROTONIX) IV  40 mg Intravenous Q12H  . sodium chloride  3 mL Intravenous Q12H  . tiotropium  18 mcg Inhalation Daily   Infusions:    Labs:  Recent Labs  08/07/14 0430 08/08/14 0337  NA 134* 133*  K 3.6 3.6  CL 95* 94*  CO2 29 29  GLUCOSE 66 99  BUN 19 16  CREATININE 1.51* 1.52*  CALCIUM 8.5* 8.5*    Recent Labs  08/06/14 1436  AST 24  ALT 16*  ALKPHOS 60  BILITOT 0.8  PROT 7.3  ALBUMIN 3.8    Recent Labs  08/07/14 1032 08/08/14 0337  WBC 5.5 6.0  HGB 7.3* 7.1*  HCT 22.4* 22.3*  MCV 70.9* 71.7*  PLT 205 218    Recent Labs  08/06/14 1436  TROPONINI 0.03   Invalid input(s): POCBNP  Recent Labs  08/06/14 2014  HGBA1C 5.9     Weights: Filed Weights   08/06/14 1414 08/06/14 1831 08/07/14 0444  Weight: 220 lb (99.791 kg) 204 lb 6.4 oz (92.715 kg) 209 lb 4.8 oz (94.938 kg)     Radiology/Studies:  Dg Chest 2 View  08/06/2014   CLINICAL DATA:  Shortness of breath for 2 days  EXAM: CHEST - 2  VIEW  COMPARISON:  05/06/2014  FINDINGS: Cardiac shadow remains enlarged. The patchy infiltrates seen on the prior exam has resolved in the interval. No focal infiltrate or effusion is seen. No significant vascular congestion is noted. No acute bony abnormality seen.  IMPRESSION: Stable cardiomegaly.  No acute abnormality noted.   Electronically Signed   By: Inez Catalina M.D.   On: 08/06/2014 15:13   Ct Head Wo Contrast  08/06/2014   CLINICAL DATA:  Syncope, fall, on Eliquis  EXAM: CT HEAD WITHOUT CONTRAST  TECHNIQUE: Contiguous axial images were  obtained from the base of the skull through the vertex without intravenous contrast.  COMPARISON:  None.  FINDINGS: No skull fracture is noted. Paranasal sinuses and mastoid air cells are unremarkable. No intracranial hemorrhage, mass effect or midline shift. No acute cortical infarction. Mild cerebral atrophy. Mild periventricular white matter decreased attenuation probable due to chronic small vessel ischemic changes. No mass lesion is noted on this unenhanced scan.  IMPRESSION: No acute intracranial abnormality. Mild cerebral atrophy. Mild periventricular chronic white matter disease.   Electronically Signed   By: Lahoma Crocker M.D.   On: 08/06/2014 16:29     Assessment and Recommendation  74 y.o. male with known chronic atrial fibrillation now with slow ventricular rate secondary to beta blocker and slightly improved at this time and chronic diastolic dysfunction heart failure diabetes essential hypertension with a vegan episode of anemia possibly due to GI bleed 1. Abstain from blocker due to concerns of bradycardia 2. Continue amiodarone for further maintenance of normal sinus rhythm 3. Abstain from anticoagulation due to concerns of bleeding and anemia 4. No further cardiac workup at this time 5. Begin ambulation and follow for need for adjustments of medication  Signed, Serafina Royals M.D. FACC

## 2014-08-08 NOTE — Progress Notes (Signed)
PT Cancellation Note  Patient Details Name: Derek Shelton MRN: [id number] DOB: [birth year]   Cancelled Treatment:    Reason Eval/Treat Not Completed: Patient not medically ready. Chart reviewed, RN consulted. Holding pt treatment at this time due to continued drop in Hb. Pt is pending transfusion as well as EGD today. Will attempt at later date/time.     Hillis Mcphatter C 08/08/2014, 11:06 AM  11:08 AM  Etta Grandchild, PT, DPT [place] License # [id number]

## 2014-08-08 NOTE — Transfer of Care (Signed)
Immediate Anesthesia Transfer of Care Note  Patient: Derek Shelton  Procedure(s) Performed: Procedure(s) with comments: ESOPHAGOGASTRODUODENOSCOPY (EGD) (N/A) - plan for early afternoon  Patient Location: PACU and Endoscopy Unit  Anesthesia Type:General  Level of Consciousness: sedated  Airway & Oxygen Therapy: Patient Spontanous Breathing and Patient connected to nasal cannula oxygen  Post-op Assessment: Report given to RN  Post vital signs: Reviewed and stable  Last Vitals:  Filed Vitals:   08/08/14 1444  BP: 153/92  Pulse: 55  Temp: 36.7 C  Resp: 17    Complications: No apparent anesthesia complications

## 2014-08-08 NOTE — Op Note (Signed)
[facility] Gastroenterology Patient Name: Derek Shelton Procedure Date: 08/08/2014 3:31 PM MRN: [id number] Account #: [id number] Date of Birth: 1940/12/18 Admit Type: Inpatient Age: 74 Room: [facility] ENDO ROOM 1 Gender: Male Note Status: Finalized Procedure:         Upper GI endoscopy Indications:       Iron deficiency anemia, Heme positive stool Providers:         Manya Silvas, MD Referring MD:      No Local Md, MD (Referring MD) Medicines:         Propofol per Anesthesia Complications:     No immediate complications. Procedure:         Pre-Anesthesia Assessment:                    - After reviewing the risks and benefits, the patient was                     deemed in satisfactory condition to undergo the procedure.                    After obtaining informed consent, the endoscope was passed                     under direct vision. Throughout the procedure, the                     patient's blood pressure, pulse, and oxygen saturations                     were monitored continuously. The Endoscope was introduced                     through the mouth, and advanced to the second part of                     duodenum. The upper GI endoscopy was accomplished without                     difficulty. The patient tolerated the procedure well. The                     upper GI endoscopy was accomplished without difficulty.                     The patient tolerated the procedure well. Findings:      The examined esophagus was normal.      Diffuse mildly erythematous mucosa without bleeding was found in the       gastric body and in the gastric antrum.      The examined duodenum was normal. Impression:        - Normal esophagus.                    - Erythematous mucosa in the gastric body and antrum.                    - Normal examined duodenum.                    - No specimens collected. Recommendation:    - The findings and recommendations were discussed with the                     patient's family. Consider colonoscopy Manya Silvas, MD 08/08/2014  3:58:42 PM This report has been signed electronically. Number of Addenda: 0 Note Initiated On: 08/08/2014 3:31 PM      [facility]

## 2014-08-08 NOTE — Clinical Documentation Improvement (Signed)
  H&P states "Acute on chronic anemia" symptomatic requiring transfusion, Hb 7.4. Please clarify "Acute anemia" in Notes.  . Documentation of Anemia should include the type of anemia:    --Nutritional anemia    --Hemolytic anemia    --Acute blood loss anemia    --Other (please specify) . Other condition . Unable to determine  Thank Sheral Flow RN [place] [phone] HIM department

## 2014-08-08 NOTE — Progress Notes (Signed)
Hb 7.1- transfused one unot due to cardiac history today.  Feels very weak. Endoscopy today./  Need PT eval to decide discharge plan.

## 2014-08-08 NOTE — Consult Note (Signed)
Pt to have colonoscopy on Sunday.  Will let eat full liquid tonight and tomorrow go on clear liquids and do a colon prep tomorrow afternoon/evening for Sunday colonoscopy

## 2014-08-08 NOTE — Anesthesia Postprocedure Evaluation (Signed)
  Anesthesia Post-op Note  Patient: Derek Shelton  Procedure(s) Performed: Procedure(s) with comments: ESOPHAGOGASTRODUODENOSCOPY (EGD) (N/A) - plan for early afternoon  Anesthesia type:General  Patient location: PACU  Post pain: Pain level controlled  Post assessment: Post-op Vital signs reviewed, Patient's Cardiovascular Status Stable, Respiratory Function Stable, Patent Airway and No signs of Nausea or vomiting  Post vital signs: Reviewed and stable  Last Vitals:  Filed Vitals:   08/08/14 1640  BP: 157/75  Pulse: 49  Temp:   Resp: 17    Level of consciousness: awake, alert  and patient cooperative  Complications: No apparent anesthesia complications

## 2014-08-09 LAB — GLUCOSE, CAPILLARY
GLUCOSE-CAPILLARY: 131 mg/dL — AB (ref 65–99)
Glucose-Capillary: 112 mg/dL — ABNORMAL HIGH (ref 65–99)
Glucose-Capillary: 154 mg/dL — ABNORMAL HIGH (ref 65–99)
Glucose-Capillary: 94 mg/dL (ref 65–99)

## 2014-08-09 LAB — CBC
HCT: 27.3 % — ABNORMAL LOW (ref 40.0–52.0)
Hemoglobin: 8.6 g/dL — ABNORMAL LOW (ref 13.0–18.0)
MCH: 23.2 pg — AB (ref 26.0–34.0)
MCHC: 31.4 g/dL — AB (ref 32.0–36.0)
MCV: 73.8 fL — ABNORMAL LOW (ref 80.0–100.0)
PLATELETS: 214 10*3/uL (ref 150–440)
RBC: 3.7 MIL/uL — ABNORMAL LOW (ref 4.40–5.90)
RDW: 20.6 % — AB (ref 11.5–14.5)
WBC: 6 10*3/uL (ref 3.8–10.6)

## 2014-08-09 MED ORDER — PEG 3350-KCL-NA BICARB-NACL 420 G PO SOLR
4000.0000 mL | Freq: Once | ORAL | Status: AC
  Administered 2014-08-09: 4000 mL via ORAL
  Filled 2014-08-09: qty 4000

## 2014-08-09 MED ORDER — HYDRALAZINE HCL 50 MG PO TABS: 100.0000 mg | ORAL_TABLET | Freq: Three times a day (TID) | ORAL | Status: DC

## 2014-08-09 MED ORDER — HYDRALAZINE HCL 50 MG PO TABS
100.0000 mg | ORAL_TABLET | Freq: Three times a day (TID) | ORAL | Status: DC
  Administered 2014-08-09 – 2014-08-12 (×11): 100 mg via ORAL
  Filled 2014-08-09 (×15): qty 2

## 2014-08-09 MED ORDER — SODIUM CHLORIDE 0.9 % IV SOLN
INTRAVENOUS | Status: DC
  Administered 2014-08-09: 21:00:00 via INTRAVENOUS

## 2014-08-09 NOTE — Progress Notes (Signed)
[place] at [place] NAME: Derek Shelton    MR#:  [id number]  DATE OF BIRTH:  [birth year]  SUBJECTIVE:  CHIEF COMPLAINT:   Chief Complaint  Patient presents with  . Shortness of Breath    Patient feels better this morning stronger denies any chest pain or shortness of breath   REVIEW OF SYSTEMS:  CONSTITUTIONAL: No fever, positive fatigue or weakness.  EYES: No blurred or double vision.  EARS, NOSE, AND THROAT: No tinnitus or ear pain.  RESPIRATORY: No cough, shortness of breath, wheezing or hemoptysis.  CARDIOVASCULAR: No chest pain, orthopnea, edema.  GASTROINTESTINAL: No nausea, vomiting, diarrhea or abdominal pain.  GENITOURINARY: No dysuria, hematuria.  ENDOCRINE: No polyuria, nocturia,  HEMATOLOGY: No anemia, easy bruising or bleeding SKIN: No rash or lesion. MUSCULOSKELETAL: No joint pain or arthritis.   NEUROLOGIC: No tingling, numbness, weakness.  PSYCHIATRY: No anxiety or depression.   ROS  DRUG ALLERGIES:   Allergies  Allergen Reactions  . Penicillins     VITALS:  Blood pressure 153/56, pulse 55, temperature 97.6 F (36.4 C), temperature source Oral, resp. rate 18, height 5\' 6"  (1.676 m), weight 94.938 kg (209 lb 4.8 oz), SpO2 97 %.  PHYSICAL EXAMINATION:  GENERAL:  74 y.o.-year-old patient lying in the bed with no acute distress.  EYES: Pupils equal, round, reactive to light and accommodation. No scleral icterus. Extraocular muscles intact. Conjunctive pale. HEENT: Head atraumatic, normocephalic. Oropharynx and nasopharynx clear.  NECK:  Supple, no jugular venous distention. No thyroid enlargement, no tenderness.  LUNGS: Normal breath sounds bilaterally, no wheezing, rales,rhonchi or crepitation. No use of accessory muscles of respiration.  CARDIOVASCULAR: S1, S2 normal. No murmurs, rubs, or gallops.  ABDOMEN: Soft, nontender, nondistended. Bowel sounds present. No organomegaly or mass.  EXTREMITIES: No  pedal edema, cyanosis, or clubbing.  NEUROLOGIC: Cranial nerves II through XII are intact. Muscle strength 5/5 in all extremities. Sensation intact. Gait not checked.  PSYCHIATRIC: The patient is alert and oriented x 3.  SKIN: No obvious rash, lesion, or ulcer.   Physical Exam LABORATORY PANEL:   CBC  Recent Labs Lab 08/09/14 0954  WBC 6.0  HGB 8.6*  HCT 27.3*  PLT 214   ------------------------------------------------------------------------------------------------------------------  Chemistries   Recent Labs Lab 08/06/14 1436  08/08/14 0337  NA 129*  < > 133*  K 4.1  < > 3.6  CL 96*  < > 94*  CO2 27  < > 29  GLUCOSE 167*  < > 99  BUN 22*  < > 16  CREATININE 1.56*  < > 1.52*  CALCIUM 8.2*  < > 8.5*  AST 24  --   --   ALT 16*  --   --   ALKPHOS 60  --   --   BILITOT 0.8  --   --   < > = values in this interval not displayed. ------------------------------------------------------------------------------------------------------------------  Cardiac Enzymes  Recent Labs Lab 08/06/14 1436  TROPONINI 0.03   ------------------------------------------------------------------------------------------------------------------  RADIOLOGY:  No results found.  ASSESSMENT AND PLAN:    #1 GI bleeding:   stool is FOBT positive. EGD with some gastritis Colonoscopy on Sunday  He has a history of reflux and is on omeprazole at home.   He was also on Eliquis and meloxicam. Which are being held   twice a day IV PPI .   #2 symptomatic anemia, acute on chronic due to blood loss:  Status post transfusion   #3 bradycardia: On presentation  he is in normal sinus with a fairly wide QRS bradycardic, with some first degree block, HR  50s and 40s.  telemetry. Heart rate stable.   #4 obstructive sleep apnea noncompliant with CPAP:  CPAP while inpatient.   #5 chronic diastolic heart failure:   does have some peripheral edema, but on chest x-ray no edema or vascular congestion.  Does not seem to be having an exacerbation at this time. Continue home medications .  #6 hyponatremia: He has chronic hyponatremia as a result of his congestive heart failure.   Continue to assess.   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   #9 generalized weakness with fall likely due to above mentioned medical concerns. Will also have physical therapy see the patient prior to discharge.  #10 Hypothyroidism    TSH is high- increase Thyroxin increased 100.  All the records are reviewed and case discussed with Care Management/Social Workerr. Management plans discussed with the patient, family and they are in agreement.  CODE STATUS: full  TOTAL TIME TAKING CARE OF THIS PATIENT: 35 minutes.   More than 50% of the visit was spent in counseling/coordination of care  POSSIBLE D/C IN 1-2 DAYS, DEPENDING ON CLINICAL CONDITION.   Dustin Flock M.D on 08/09/2014   Between 7am to 6pm - Pager - [phone]  After 6pm go to [email or web address] - password [email or web address] [facility]  [facility]  Office  [phone]  CC: Primary care physician; No primary care provider on file.

## 2014-08-09 NOTE — Progress Notes (Signed)
[place] at [place] NAME: Derek Shelton    MR#:  [id number]  DATE OF BIRTH:  1940/02/07  SUBJECTIVE:  CHIEF COMPLAINT:   Chief Complaint  Patient presents with  . Shortness of Breath    S/p 1 unit PRBC transfusion, little more strong today. Hb still 7, will give one more unit due to cardiac history. Feeling hungry.  REVIEW OF SYSTEMS:  CONSTITUTIONAL: No fever, positive fatigue or weakness.  EYES: No blurred or double vision.  EARS, NOSE, AND THROAT: No tinnitus or ear pain.  RESPIRATORY: No cough, shortness of breath, wheezing or hemoptysis.  CARDIOVASCULAR: No chest pain, orthopnea, edema.  GASTROINTESTINAL: No nausea, vomiting, diarrhea or abdominal pain.  GENITOURINARY: No dysuria, hematuria.  ENDOCRINE: No polyuria, nocturia,  HEMATOLOGY: No anemia, easy bruising or bleeding SKIN: No rash or lesion. MUSCULOSKELETAL: No joint pain or arthritis.   NEUROLOGIC: No tingling, numbness, weakness.  PSYCHIATRY: No anxiety or depression.   ROS  DRUG ALLERGIES:   Allergies  Allergen Reactions  . Penicillins     VITALS:  Blood pressure 132/98, pulse 52, temperature 98.3 F (36.8 C), temperature source Axillary, resp. rate 20, height 5\' 6"  (1.676 m), weight 94.938 kg (209 lb 4.8 oz), SpO2 96 %.  PHYSICAL EXAMINATION:  GENERAL:  74 y.o.-year-old patient lying in the bed with no acute distress.  EYES: Pupils equal, round, reactive to light and accommodation. No scleral icterus. Extraocular muscles intact. Conjunctive pale. HEENT: Head atraumatic, normocephalic. Oropharynx and nasopharynx clear.  NECK:  Supple, no jugular venous distention. No thyroid enlargement, no tenderness.  LUNGS: Normal breath sounds bilaterally, no wheezing, rales,rhonchi or crepitation. No use of accessory muscles of respiration.  CARDIOVASCULAR: S1, S2 normal. No murmurs, rubs, or gallops.  ABDOMEN: Soft, nontender, nondistended. Bowel sounds present.  No organomegaly or mass.  EXTREMITIES: No pedal edema, cyanosis, or clubbing.  NEUROLOGIC: Cranial nerves II through XII are intact. Muscle strength 5/5 in all extremities. Sensation intact. Gait not checked.  PSYCHIATRIC: The patient is alert and oriented x 3.  SKIN: No obvious rash, lesion, or ulcer.   Physical Exam LABORATORY PANEL:   CBC  Recent Labs Lab 08/08/14 0337  WBC 6.0  HGB 7.1*  HCT 22.3*  PLT 218   ------------------------------------------------------------------------------------------------------------------  Chemistries   Recent Labs Lab 08/06/14 1436  08/08/14 0337  NA 129*  < > 133*  K 4.1  < > 3.6  CL 96*  < > 94*  CO2 27  < > 29  GLUCOSE 167*  < > 99  BUN 22*  < > 16  CREATININE 1.56*  < > 1.52*  CALCIUM 8.2*  < > 8.5*  AST 24  --   --   ALT 16*  --   --   ALKPHOS 60  --   --   BILITOT 0.8  --   --   < > = values in this interval not displayed. ------------------------------------------------------------------------------------------------------------------  Cardiac Enzymes  Recent Labs Lab 08/06/14 1436  TROPONINI 0.03   ------------------------------------------------------------------------------------------------------------------  RADIOLOGY:  No results found.  ASSESSMENT AND PLAN:    #1 GI bleeding:   stool is FOBT positive.  had a colonoscopy in the past   He has a history of reflux and is on omeprazole at home.   He was also on Eliquis and meloxicam.    twice a day IV PPI, consult gastroenterology and keep him on a clear diet,   For now hold Eliquis. Hold NSAIDs.  Endoscopy today.   #2 symptomatic anemia, acute on chronic due to blood loss:  He is short of breath fatigued and dizzy which could be due to this new anemia.   anemia panel to assess for other potential causes of anemia.  transfused 1 unit.    2nd unit on 08/08/14 due to cardiac hx keep around 8.   #3 bradycardia: On presentation he is in normal sinus with  a fairly wide QRS bradycardic, with some first degree block, HR  50s and 40s.   telemetry. His shortness of breath dizziness and fatigue may be due to bradycardia.   Hold Coreg for now and consult his cardiologist.   #4 obstructive sleep apnea noncompliant with CPAP: Restart CPAP while inpatient.   #5 chronic diastolic heart failure:   does have some peripheral edema, but on chest x-ray no edema or vascular congestion. Does not seem to be having an exacerbation at this time. Continue home medications .  #6 hyponatremia: He has chronic hyponatremia as a result of his congestive heart failure.   Continue to assess.   #7 chronic kidney disease stage III: He does have a small decrease in his GFR. Continue to monitor closely.  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 Check hemoglobin A1c  #9 generalized weakness with fall likely due to above mentioned medical concerns. Will also have physical therapy see the patient prior to discharge.  #10 Hypothyroidism    TSH is high- increase Thyroxin from 50 to 100.  All the records are reviewed and case discussed with Care Management/Social Workerr. Management plans discussed with the patient, family and they are in agreement.  CODE STATUS: full  TOTAL TIME TAKING CARE OF THIS PATIENT: 35 minutes.   More than 50% of the visit was spent in counseling/coordination of care  POSSIBLE D/C IN 1-2 DAYS, DEPENDING ON CLINICAL CONDITION.   Vaughan Basta M.D on 08/09/2014   Between 7am to 6pm - Pager - [phone]  After 6pm go to [email or web address] - password [email or web address] [facility]  [facility]  Office  [phone]  CC: Primary care physician; No primary care provider on file.

## 2014-08-09 NOTE — Progress Notes (Signed)
Pt to have colonoscopy tomorrow. Taking prep very well. Wife at bedside. Family in to offer support. Consent signed.  Pt painfree. vss.

## 2014-08-09 NOTE — Consult Note (Signed)
Pt on clear liquids, to start prep today between 3 and 4 pm.  Will do colonoscopy tomorrow morning at 10am per plan. No new complaints,  Hgb 8.6.  Discussed with son on phone yesterday who is a resident in critical care medicine in [place].  Chest clear anterior fields, tolerated EGD well.

## 2014-08-09 NOTE — Progress Notes (Signed)
[facility] Encounter Note  Patient: Derek Shelton / Admit Date: 08/06/2014 / Date of Encounter: 08/09/2014, 9:16 AM   Subjective: No further episodes of significant shortness of breath. Patient remains in normal sinus rhythm  Review of Systems: Positive for: None Negative for: Vision change, hearing change, syncope, dizziness, nausea, vomiting,diarrhea, bloody stool, stomach pain, cough, congestion, diaphoresis, urinary frequency, urinary pain,skin lesions, skin rashes Others previously listed  Objective: Telemetry: Atrial fibrillation Physical Exam: Blood pressure 132/98, pulse 52, temperature 98.3 F (36.8 C), temperature source Axillary, resp. rate 20, height 5\' 6"  (1.676 m), weight 209 lb 4.8 oz (94.938 kg), SpO2 96 %. Body mass index is 33.8 kg/(m^2). General: Well developed, well nourished, in no acute distress. Head: Normocephalic, atraumatic, sclera non-icteric, no xanthomas, nares are without discharge. Neck: No apparent masses Lungs: Normal respirations with diffuse wheezes, no rhonchi, no rales , few basilar crackles   Heart: Irregular rate and rhythm, normal S1 S2, no murmur, no rub, no gallop, PMI is normal size and placement, carotid upstroke normal without bruit, jugular venous pressure normal Abdomen: Soft, non-tender, non-distended with normoactive bowel sounds. No hepatosplenomegaly. Abdominal aorta is normal size without bruit Extremities: Trace edema,  no cyanosis, no ulcers,  Peripheral: 2+ radial, 2+ femoral, 2+ dorsal pedal pulses Neuro: Alert and oriented. Moves all extremities spontaneously. Psych:  Responds to questions appropriately with a normal affect.   Intake/Output Summary (Last 24 hours) at 08/09/14 0916 Last data filed at 08/09/14 1478  Gross per 24 hour  Intake    830 ml  Output   2400 ml  Net  -1570 ml    Inpatient Medications:  . sodium chloride   Intravenous Once  . amiodarone  200 mg Oral Daily  . amLODipine  10  mg Oral Daily  . benazepril  10 mg Oral Daily  . benazepril  40 mg Oral Daily  . budesonide-formoterol  1 puff Inhalation BID  . donepezil  5 mg Oral QHS  . DULoxetine  60 mg Oral Daily  . finasteride  5 mg Oral Daily  . furosemide  20 mg Oral Daily  . furosemide  20 mg Oral BID  . hydrALAZINE  100 mg Oral TID  . insulin aspart  0-15 Units Subcutaneous TID WC  . insulin aspart  0-5 Units Subcutaneous QHS  . levothyroxine  100 mcg Oral QAC breakfast  . pantoprazole (PROTONIX) IV  40 mg Intravenous Q12H  . sodium chloride  3 mL Intravenous Q12H  . tiotropium  18 mcg Inhalation Daily   Infusions:    Labs:  Recent Labs  08/07/14 0430 08/08/14 0337  NA 134* 133*  K 3.6 3.6  CL 95* 94*  CO2 29 29  GLUCOSE 66 99  BUN 19 16  CREATININE 1.51* 1.52*  CALCIUM 8.5* 8.5*    Recent Labs  08/06/14 1436  AST 24  ALT 16*  ALKPHOS 60  BILITOT 0.8  PROT 7.3  ALBUMIN 3.8    Recent Labs  08/07/14 1032 08/08/14 0337  WBC 5.5 6.0  HGB 7.3* 7.1*  HCT 22.4* 22.3*  MCV 70.9* 71.7*  PLT 205 218    Recent Labs  08/06/14 1436  TROPONINI 0.03   Invalid input(s): POCBNP  Recent Labs  08/06/14 2014  HGBA1C 5.9     Weights: Filed Weights   08/06/14 1414 08/06/14 1831 08/07/14 0444  Weight: 220 lb (99.791 kg) 204 lb 6.4 oz (92.715 kg) 209 lb 4.8 oz (94.938 kg)  Radiology/Studies:  Dg Chest 2 View  08/06/2014   CLINICAL DATA:  Shortness of breath for 2 days  EXAM: CHEST - 2 VIEW  COMPARISON:  05/06/2014  FINDINGS: Cardiac shadow remains enlarged. The patchy infiltrates seen on the prior exam has resolved in the interval. No focal infiltrate or effusion is seen. No significant vascular congestion is noted. No acute bony abnormality seen.  IMPRESSION: Stable cardiomegaly.  No acute abnormality noted.   Electronically Signed   By: Inez Catalina M.D.   On: 08/06/2014 15:13   Ct Head Wo Contrast  08/06/2014   CLINICAL DATA:  Syncope, fall, on Eliquis  EXAM: CT HEAD WITHOUT  CONTRAST  TECHNIQUE: Contiguous axial images were obtained from the base of the skull through the vertex without intravenous contrast.  COMPARISON:  None.  FINDINGS: No skull fracture is noted. Paranasal sinuses and mastoid air cells are unremarkable. No intracranial hemorrhage, mass effect or midline shift. No acute cortical infarction. Mild cerebral atrophy. Mild periventricular white matter decreased attenuation probable due to chronic small vessel ischemic changes. No mass lesion is noted on this unenhanced scan.  IMPRESSION: No acute intracranial abnormality. Mild cerebral atrophy. Mild periventricular chronic white matter disease.   Electronically Signed   By: Lahoma Crocker M.D.   On: 08/06/2014 16:29     Assessment and Recommendation  74 y.o. male with known chronic atrial fibrillation now with slow ventricular rate secondary to beta blocker and slightly improved at this time and chronic diastolic dysfunction heart failure diabetes essential hypertension with a vegan episode of anemia possibly due to GI bleed 1. Abstain from blocker due to concerns of bradycardia and watch closely for further significant symptoms with ambulation 2. Continue amiodarone for further maintenance of normal sinus rhythm 3. Abstain from anticoagulation due to concerns of bleeding and anemia 4. No further cardiac workup at this time 5. Begin ambulation and follow for need for adjustments of medication 6. No further cardiac diagnostics necessary at this time and patient okay for discharge to home from cardiac standpoint with follow-up with Dr. Yancey Flemings in 2 weeks 7. Call if further questions  Signed, Serafina Royals M.D. FACC

## 2014-08-10 ENCOUNTER — Encounter
Admission: EM | Disposition: A | Discharge status: Home / Self Care | Payer: Self-pay | Source: Home / Self Care | Attending: Internal Medicine

## 2014-08-10 ENCOUNTER — Inpatient Hospital Stay: Payer: Medicare Other | Admitting: Anesthesiology

## 2014-08-10 HISTORY — PX: COLONOSCOPY: SHX5424

## 2014-08-10 LAB — TYPE AND SCREEN
ABO/RH(D): A POS
ANTIBODY SCREEN: NEGATIVE
UNIT DIVISION: 0
Unit division: 0

## 2014-08-10 LAB — BASIC METABOLIC PANEL
Anion gap: 9 (ref 5–15)
BUN: 10 mg/dL (ref 6–20)
CO2: 30 mmol/L (ref 22–32)
Calcium: 8.6 mg/dL — ABNORMAL LOW (ref 8.9–10.3)
Chloride: 98 mmol/L — ABNORMAL LOW (ref 101–111)
Creatinine, Ser: 1.23 mg/dL (ref 0.61–1.24)
GFR calc Af Amer: >60 mL/min (ref >60)
GFR, EST NON AFRICAN AMERICAN: 56 mL/min — AB (ref >60)
Glucose, Bld: 97 mg/dL (ref 65–99)
POTASSIUM: 3 mmol/L — AB (ref 3.5–5.1)
SODIUM: 137 mmol/L (ref 135–145)

## 2014-08-10 LAB — CBC
HCT: 25.2 % — ABNORMAL LOW (ref 40.0–52.0)
HEMOGLOBIN: 8 g/dL — AB (ref 13.0–18.0)
MCH: 23.2 pg — ABNORMAL LOW (ref 26.0–34.0)
MCHC: 31.9 g/dL — AB (ref 32.0–36.0)
MCV: 72.9 fL — ABNORMAL LOW (ref 80.0–100.0)
Platelets: 211 10*3/uL (ref 150–440)
RBC: 3.45 MIL/uL — AB (ref 4.40–5.90)
RDW: 21 % — AB (ref 11.5–14.5)
WBC: 6.5 10*3/uL (ref 3.8–10.6)

## 2014-08-10 LAB — GLUCOSE, CAPILLARY
GLUCOSE-CAPILLARY: 120 mg/dL — AB (ref 65–99)
GLUCOSE-CAPILLARY: 161 mg/dL — AB (ref 65–99)
Glucose-Capillary: 104 mg/dL — ABNORMAL HIGH (ref 65–99)
Glucose-Capillary: 128 mg/dL — ABNORMAL HIGH (ref 65–99)

## 2014-08-10 SURGERY — COLONOSCOPY
Anesthesia: Monitor Anesthesia Care

## 2014-08-10 SURGERY — COLONOSCOPY
Anesthesia: General

## 2014-08-10 MED ORDER — POTASSIUM CHLORIDE 10 MEQ/100ML IV SOLN
10.0000 meq | INTRAVENOUS | Status: DC
  Administered 2014-08-10: 10 meq via INTRAVENOUS
  Filled 2014-08-10 (×2): qty 100

## 2014-08-10 MED ORDER — SODIUM CHLORIDE 0.9 % IV SOLN: INTRAVENOUS | Status: DC

## 2014-08-10 MED ORDER — PROPOFOL INFUSION 10 MG/ML OPTIME
INTRAVENOUS | Status: DC | PRN
Start: 2014-08-10 — End: 2014-08-10
  Administered 2014-08-10: 140 ug/kg/min via INTRAVENOUS

## 2014-08-10 MED ORDER — POTASSIUM CHLORIDE CRYS ER 20 MEQ PO TBCR
60.0000 meq | EXTENDED_RELEASE_TABLET | Freq: Once | ORAL | Status: AC
  Administered 2014-08-10: 60 meq via ORAL
  Filled 2014-08-10: qty 3

## 2014-08-10 MED ORDER — MIDAZOLAM HCL 2 MG/2ML IJ SOLN
INTRAMUSCULAR | Status: DC | PRN
  Administered 2014-08-10: 1 mg via INTRAVENOUS

## 2014-08-10 NOTE — Transfer of Care (Signed)
Immediate Anesthesia Transfer of Care Note  Patient: Derek Shelton  Procedure(s) Performed: Procedure(s): COLONOSCOPY (N/A)  Patient Location: PACU and Endoscopy Unit  Anesthesia Type:General  Level of Consciousness: sedated and responds to stimulation  Airway & Oxygen Therapy: Patient Spontanous Breathing and Patient connected to nasal cannula oxygen  Post-op Assessment: Report given to RN and Post -op Vital signs reviewed and stable  Post vital signs: Reviewed and stable  Last Vitals:  Filed Vitals:   08/10/14 1130  BP: 138/83  Pulse: 54  Temp: 36.2 C  Resp:     Complications: No apparent anesthesia complications

## 2014-08-10 NOTE — Anesthesia Preprocedure Evaluation (Signed)
Anesthesia Evaluation  Patient identified by MRN, date of birth, ID band Patient confused    Reviewed: Allergy & Precautions, NPO status   Airway Mallampati: II       Dental  (+) Teeth Intact   Pulmonary shortness of breath and with exertion, sleep apnea , COPD   + decreased breath sounds      Cardiovascular hypertension, Pt. on medications +CHF Rhythm:Irregular     Neuro/Psych negative neurological ROS     GI/Hepatic Neg liver ROS, GERD-  ,  Endo/Other  diabetes, Poorly Controlled, Type 2, Oral Hypoglycemic Agents  Renal/GU      Musculoskeletal   Abdominal (+) + obese,   Peds negative pediatric ROS (+)  Hematology  (+) anemia ,   Anesthesia Other Findings   Reproductive/Obstetrics                             Anesthesia Physical Anesthesia Plan  ASA: III and emergent  Anesthesia Plan: General   Post-op Pain Management:    Induction: Intravenous  Airway Management Planned: Nasal Cannula  Additional Equipment:   Intra-op Plan:   Post-operative Plan:   Informed Consent:   Plan Discussed with: CRNA  Anesthesia Plan Comments:         Anesthesia Quick Evaluation

## 2014-08-10 NOTE — Consult Note (Signed)
Colonoscopy was normal except small internal hemorrhoids.  Ileocecal valve and distal ileum normal.  Consider capsule endoscopy.  Hematology consult

## 2014-08-10 NOTE — Progress Notes (Signed)
[facility] NAME: Derek Shelton    MR#:  [id number]  DATE OF BIRTH:  1940/05/28  SUBJECTIVE:  CHIEF COMPLAINT:   Chief Complaint  Patient presents with  . Shortness of Breath    Some confusion overnight denies any other complaints   REVIEW OF SYSTEMS:  CONSTITUTIONAL: No fever, positive fatigue or weakness.  EYES: No blurred or double vision.  EARS, NOSE, AND THROAT: No tinnitus or ear pain.  RESPIRATORY: No cough, shortness of breath, wheezing or hemoptysis.  CARDIOVASCULAR: No chest pain, orthopnea, edema.  GASTROINTESTINAL: No nausea, vomiting, diarrhea or abdominal pain.  GENITOURINARY: No dysuria, hematuria.  ENDOCRINE: No polyuria, nocturia,  HEMATOLOGY: No anemia, easy bruising or bleeding SKIN: No rash or lesion. MUSCULOSKELETAL: No joint pain or arthritis.   NEUROLOGIC: No tingling, numbness, weakness.  PSYCHIATRY: No anxiety or depression.   ROS  DRUG ALLERGIES:   Allergies  Allergen Reactions  . Penicillins     VITALS:  Blood pressure 186/62, pulse 56, temperature 97.2 F (36.2 C), temperature source Tympanic, resp. rate 17, height 5\' 6"  (1.676 m), weight 94.938 kg (209 lb 4.8 oz), SpO2 99 %.  PHYSICAL EXAMINATION:  GENERAL:  74 y.o.-year-old patient lying in the bed with no acute distress.  EYES: Pupils equal, round, reactive to light and accommodation. No scleral icterus. Extraocular muscles intact. Conjunctive pale. HEENT: Head atraumatic, normocephalic. Oropharynx and nasopharynx clear.  NECK:  Supple, no jugular venous distention. No thyroid enlargement, no tenderness.  LUNGS: Normal breath sounds bilaterally, no wheezing, rales,rhonchi or crepitation. No use of accessory muscles of respiration.  CARDIOVASCULAR: S1, S2 normal. No murmurs, rubs, or gallops.  ABDOMEN: Soft, nontender, nondistended. Bowel sounds present. No organomegaly or mass.  EXTREMITIES: No pedal edema, cyanosis, or  clubbing.  NEUROLOGIC: Cranial nerves II through XII are intact. Muscle strength 5/5 in all extremities. Sensation intact. Gait not checked.  PSYCHIATRIC: The patient is alert and oriented x 3.  SKIN: No obvious rash, lesion, or ulcer.   Physical Exam LABORATORY PANEL:   CBC  Recent Labs Lab 08/10/14 0621  WBC 6.5  HGB 8.0*  HCT 25.2*  PLT 211   ------------------------------------------------------------------------------------------------------------------  Chemistries   Recent Labs Lab 08/06/14 1436  08/10/14 0621  NA 129*  < > 137  K 4.1  < > 3.0*  CL 96*  < > 98*  CO2 27  < > 30  GLUCOSE 167*  < > 97  BUN 22*  < > 10  CREATININE 1.56*  < > 1.23  CALCIUM 8.2*  < > 8.6*  AST 24  --   --   ALT 16*  --   --   ALKPHOS 60  --   --   BILITOT 0.8  --   --   < > = values in this interval not displayed. ------------------------------------------------------------------------------------------------------------------  Cardiac Enzymes  Recent Labs Lab 08/06/14 1436  TROPONINI 0.03   ------------------------------------------------------------------------------------------------------------------  RADIOLOGY:  No results found.  ASSESSMENT AND PLAN:    #1 GI bleeding:   stool is FOBT positive. EGD with some gastritis Colonoscopy shows no obvious source of bleeding  He has a history of reflux and is on omeprazole at home.   He was also on Eliquis and meloxicam. Which are being held   twice a day IV PPI   Plan for a capsule endoscopy on Tuesday .   #2 symptomatic anemia, acute on chronic due to blood loss:  Status post transfusion,  follow hemoglobin    #3 bradycardia: On presentation he is in normal sinus with a fairly wide QRS bradycardic, with some first degree block, HR  50s and 40s.  telemetry. Heart rate stable.   #4 obstructive sleep apnea noncompliant with CPAP:  CPAP while inpatient.   #5 chronic diastolic heart failure:   does have some peripheral  edema, but on chest x-ray no edema or vascular congestion. Does not seem to be having an exacerbation at this time. Continue home medications .  #6 hyponatremia: He has chronic hyponatremia as a result of his congestive heart failure.   Continue to assess.   #7 chronic kidney disease stage III: Monitor closely  #8 diabetes mellitus type 2 in obese patient:Sliding scale.   #9 generalized weakness with fall likely due to above mentioned medical concerns.Physical therapy  #10 Hypothyroidism    TSH is high- increase Thyroxin increased 100.  All the records are reviewed and case discussed with Care Management/Social Workerr. Management plans discussed with the patient, family and they are in agreement.  CODE STATUS: full  TOTAL TIME TAKING CARE OF THIS PATIENT: 35 minutes.   More than 50% of the visit was spent in counseling/coordination of care  POSSIBLE D/C IN 1-2 DAYS, DEPENDING ON CLINICAL CONDITION.   Dustin Flock M.D on 08/10/2014   Between 7am to 6pm - Pager - [phone]  After 6pm go to [email or web address] - password [email or web address] [facility]  [facility]  Office  [phone]  CC: Primary care physician; No primary care provider on file.

## 2014-08-10 NOTE — Anesthesia Postprocedure Evaluation (Signed)
  Anesthesia Post-op Note  Patient: Derek Shelton  Procedure(s) Performed: Procedure(s): COLONOSCOPY (N/A)  Anesthesia type:General  Patient location: PACU  Post pain: Pain level controlled  Post assessment: Post-op Vital signs reviewed, Patient's Cardiovascular Status Stable, Respiratory Function Stable, Patent Airway and No signs of Nausea or vomiting  Post vital signs: Reviewed and stable  Last Vitals:  Filed Vitals:   08/10/14 1150  BP: 144/91  Pulse: 55  Temp:   Resp: 17    Level of consciousness: awake, alert  and patient cooperative  Complications: No apparent anesthesia complications

## 2014-08-10 NOTE — Op Note (Signed)
[facility] Gastroenterology Patient Name: Derek Shelton Procedure Date: 08/10/2014 11:01 AM MRN: [id number] Account #: [id number] Date of Birth: 23-Apr-1940 Admit Type: Inpatient Age: 74 Room: 4 Gender: Male Note Status: Finalized Procedure:         Colonoscopy Indications:       Heme positive stool, Unexplained iron deficiency anemia Providers:         Manya Silvas, MD Referring MD:      Earleen Newport. Volanda Napoleon (Referring MD) Medicines:         Propofol per Anesthesia Complications:     No immediate complications. Procedure:         Pre-Anesthesia Assessment:                    - After reviewing the risks and benefits, the patient was                     deemed in satisfactory condition to undergo the procedure.                    After obtaining informed consent, the colonoscope was                     passed under direct vision. Throughout the procedure, the                     patient's blood pressure, pulse, and oxygen saturations                     were monitored continuously. The Colonoscope was                     introduced through the anus and advanced to the the cecum,                     identified by appendiceal orifice and ileocecal valve. The                     colonoscopy was performed without difficulty. The patient                     tolerated the procedure well. The quality of the bowel                     preparation was excellent. Findings:      Internal hemorrhoids were found during endoscopy. The hemorrhoids were       small and Grade I (internal hemorrhoids that do not prolapse).      The terminal ileum appeared normal.      The exam was otherwise without abnormality. Impression:        - Internal hemorrhoids.                    - The examined portion of the ileum was normal.                    - The examination was otherwise normal.                    - No specimens collected. Recommendation:    - The findings and recommendations  were discussed with the                     patient's family. Consider small bowel capsule study. Manya Silvas, MD  08/10/2014 11:30:46 AM This report has been signed electronically. Number of Addenda: 0 Note Initiated On: 08/10/2014 11:01 AM Scope Withdrawal Time: 0 hours 4 minutes 22 seconds  Total Procedure Duration: 0 hours 7 minutes 23 seconds       [facility]

## 2014-08-11 LAB — BASIC METABOLIC PANEL
Anion gap: 8 (ref 5–15)
BUN: 12 mg/dL (ref 6–20)
CO2: 28 mmol/L (ref 22–32)
CREATININE: 1.34 mg/dL — AB (ref 0.61–1.24)
Calcium: 8.8 mg/dL — ABNORMAL LOW (ref 8.9–10.3)
Chloride: 98 mmol/L — ABNORMAL LOW (ref 101–111)
GFR calc Af Amer: 58 mL/min — ABNORMAL LOW (ref >60)
GFR calc non Af Amer: 50 mL/min — ABNORMAL LOW (ref >60)
Glucose, Bld: 108 mg/dL — ABNORMAL HIGH (ref 65–99)
Potassium: 3.6 mmol/L (ref 3.5–5.1)
SODIUM: 134 mmol/L — AB (ref 135–145)

## 2014-08-11 LAB — CBC
HCT: 25 % — ABNORMAL LOW (ref 40.0–52.0)
Hemoglobin: 8.1 g/dL — ABNORMAL LOW (ref 13.0–18.0)
MCH: 23.8 pg — ABNORMAL LOW (ref 26.0–34.0)
MCHC: 32.3 g/dL (ref 32.0–36.0)
MCV: 73.5 fL — ABNORMAL LOW (ref 80.0–100.0)
Platelets: 204 10*3/uL (ref 150–440)
RBC: 3.41 MIL/uL — AB (ref 4.40–5.90)
RDW: 21.8 % — AB (ref 11.5–14.5)
WBC: 6.7 10*3/uL (ref 3.8–10.6)

## 2014-08-11 LAB — GLUCOSE, CAPILLARY
GLUCOSE-CAPILLARY: 120 mg/dL — AB (ref 65–99)
GLUCOSE-CAPILLARY: 149 mg/dL — AB (ref 65–99)
Glucose-Capillary: 121 mg/dL — ABNORMAL HIGH (ref 65–99)
Glucose-Capillary: 189 mg/dL — ABNORMAL HIGH (ref 65–99)

## 2014-08-11 MED ORDER — POLYETHYLENE GLYCOL 3350 17 GM/SCOOP PO POWD
1.0000 | Freq: Once | ORAL | Status: AC
  Administered 2014-08-11: 255 g via ORAL
  Filled 2014-08-11: qty 255

## 2014-08-11 NOTE — Care Management Important Message (Signed)
Important Message  Patient Details  Name: Derek Shelton MRN: [id number] Date of Birth: [birth year]   Medicare Important Message Given:  Yes-third notification given    Juliann Pulse A Allmond 08/11/2014, 1:36 PM

## 2014-08-11 NOTE — Progress Notes (Signed)
Physical Therapy Treatment Patient Details Name: Derek Shelton MRN: [id number] DOB: Aug 28, 1940 Today's Date: 08/11/2014    History of Present Illness Derek Siddiquiis a 74 yo [place] male (speaks Urdu) with a known history of obstructive sleep apnea and not compliant with CPAP, chronic diastolic congestive heart failure, obesity, paroxysmal atrial fibrillation on Eliquis, diabetes and hypertension presents with several days of progressive shortness of breath and fatigue. Yesterday he fell while walking to his bedroom and his wife found him on the floor, she was able to get him up to the bed and he rested and felt slightly better. This morning he continued to be very short of breath and dizzy and was brought to the emergency room by his family. On presentation he is found to be bradycardic in the 40s and 50s and also anemic with a hemoglobin of 6.4. He denies nausea vomiting abdominal pain melanoma or hematochezia. Denies palpitations, diaphoresis or syncope or chest pain. The history is provided by his son as the has dementia and is a poor historian.    PT Comments    Pt demonstrates good mobility and independence in bed mobility, toileting, and transfers. No LOB with ambulation. Encouraged pt to perform leg exercises throughout the day and sit up in chair to promote strength. At present pt wishes to return to bed and continue nap.   Follow Up Recommendations  Home health PT     Equipment Recommendations  None recommended by PT    Recommendations for Other Services       Precautions / Restrictions      Mobility  Bed Mobility Overal bed mobility: Independent                Transfers Overall transfer level: Independent Equipment used: None                Ambulation/Gait Ambulation/Gait assistance: Min guard Ambulation Distance (Feet): 350 Feet (Pt initially ambulates to from bathroom) Assistive device: None Gait Pattern/deviations: WFL(Within Functional  Limits)   Gait velocity interpretation: at or above normal speed for age/gender General Gait Details: Pt able to tolerate looking in various directions while ambulating without LOB   Stairs            Wheelchair Mobility    Modified Rankin (Stroke Patients Only)       Balance Overall balance assessment: No apparent balance deficits (not formally assessed)                                  Cognition Arousal/Alertness: Awake/alert (Was sleeping initially, but easily awoken and agrees to PT) Behavior During Therapy: Derek Shelton for tasks assessed/performed Overall Cognitive Status: Within Functional Limits for tasks assessed                      Exercises General Exercises - Lower Extremity Ankle Circles/Pumps: Both;20 reps;Seated;AROM Long Arc Quad: AROM;Both;20 reps;Seated Hip Flexion/Marching: AROM;Both;20 reps;Seated    General Comments        Pertinent Vitals/Pain Pain Assessment: No/denies pain    Home Living                      Prior Function            PT Goals (current goals can now be found in the care plan section)      Frequency       PT Plan Current plan  remains appropriate    Co-evaluation             End of Session Equipment Utilized During Treatment: Gait belt Activity Tolerance: Patient tolerated treatment well Patient left: in bed;with family/visitor present (Spouse requests no alarms set; pt is moderate fall risk)     Time: 8466-5993 PT Time Calculation (min) (ACUTE ONLY): 24 min  Charges:  $Gait Training: 8-22 mins $Therapeutic Exercise: 8-22 mins                    G Codes:      Charlaine Dalton 08/11/2014, 2:57 PM

## 2014-08-11 NOTE — Progress Notes (Signed)
Pt agitated and pulling off telemetry leads multiple times this shift. Pt scratching chest and buttocks.  No rash noted.  Barrier moisture cream applied at this time with reported relief.  Foam dressing to gluteal cleft removed by patient and declines new dressing at this time. Tawni Millers, RN

## 2014-08-11 NOTE — Progress Notes (Signed)
[facility] NAME: Derek Shelton    MR#:  [id number]  DATE OF BIRTH:  Jan 29, 1940  SUBJECTIVE:  CHIEF COMPLAINT:   Chief Complaint  Patient presents with  . Shortness of Breath    Patient doing well awaiting capsule endoscopy denies any significant complaints   REVIEW OF SYSTEMS:  CONSTITUTIONAL: No fever, positive fatigue or weakness.  EYES: No blurred or double vision.  EARS, NOSE, AND THROAT: No tinnitus or ear pain.  RESPIRATORY: No cough, shortness of breath, wheezing or hemoptysis.  CARDIOVASCULAR: No chest pain, orthopnea, edema.  GASTROINTESTINAL: No nausea, vomiting, diarrhea or abdominal pain.  GENITOURINARY: No dysuria, hematuria.  ENDOCRINE: No polyuria, nocturia,  HEMATOLOGY: No anemia, easy bruising or bleeding SKIN: No rash or lesion. MUSCULOSKELETAL: No joint pain or arthritis.   NEUROLOGIC: No tingling, numbness, weakness.  PSYCHIATRY: No anxiety or depression.   ROS  DRUG ALLERGIES:   Allergies  Allergen Reactions  . Penicillins     VITALS:  Blood pressure 140/47, pulse 57, temperature 98.1 F (36.7 C), temperature source Oral, resp. rate 20, height 5\' 6"  (1.676 m), weight 94.938 kg (209 lb 4.8 oz), SpO2 100 %.  PHYSICAL EXAMINATION:  GENERAL:  74 y.o.-year-old patient lying in the bed with no acute distress.  EYES: Pupils equal, round, reactive to light and accommodation. No scleral icterus. Extraocular muscles intact. Conjunctive pale. HEENT: Head atraumatic, normocephalic. Oropharynx and nasopharynx clear.  NECK:  Supple, no jugular venous distention. No thyroid enlargement, no tenderness.  LUNGS: Normal breath sounds bilaterally, no wheezing, rales,rhonchi or crepitation. No use of accessory muscles of respiration.  CARDIOVASCULAR: S1, S2 normal. No murmurs, rubs, or gallops.  ABDOMEN: Soft, nontender, nondistended. Bowel sounds present. No organomegaly or mass.  EXTREMITIES: No pedal  edema, cyanosis, or clubbing.  NEUROLOGIC: Cranial nerves II through XII are intact. Muscle strength 5/5 in all extremities. Sensation intact. Gait not checked.  PSYCHIATRIC: The patient is alert and oriented x 3.  SKIN: No obvious rash, lesion, or ulcer.   Physical Exam LABORATORY PANEL:   CBC  Recent Labs Lab 08/11/14 0440  WBC 6.7  HGB 8.1*  HCT 25.0*  PLT 204   ------------------------------------------------------------------------------------------------------------------  Chemistries   Recent Labs Lab 08/06/14 1436  08/11/14 0440  NA 129*  < > 134*  K 4.1  < > 3.6  CL 96*  < > 98*  CO2 27  < > 28  GLUCOSE 167*  < > 108*  BUN 22*  < > 12  CREATININE 1.56*  < > 1.34*  CALCIUM 8.2*  < > 8.8*  AST 24  --   --   ALT 16*  --   --   ALKPHOS 60  --   --   BILITOT 0.8  --   --   < > = values in this interval not displayed. ------------------------------------------------------------------------------------------------------------------  Cardiac Enzymes  Recent Labs Lab 08/06/14 1436  TROPONINI 0.03   ------------------------------------------------------------------------------------------------------------------  RADIOLOGY:  No results found.  ASSESSMENT AND PLAN:    #1 GI bleeding:   stool is FOBT positive. EGD with some gastritis Colonoscopy shows no obvious source of bleeding  He has a history of reflux and is on omeprazole at home.   He was also on Eliquis and meloxicam. Which are being held   twice a day IV PPI   Plan for a capsule endoscopy tomorrow.   #2 symptomatic anemia, acute on chronic due to blood loss:  Status post  transfusion, hemoglobin stable   #3 bradycardia: On presentation he is in normal sinus with a fairly wide QRS bradycardic, with some first degree block, HR  50s and 40s.  telemetry. Heart rate stable.   #4 obstructive sleep apnea noncompliant with CPAP:  CPAP while inpatient.   #5 chronic diastolic heart failure:   does  have some peripheral edema, but on chest x-ray no edema or vascular congestion. Does not seem to be having an exacerbation at this time. Continue home medications .  #6 hyponatremia: He has chronic hyponatremia as a result of his congestive heart failure.   Continue to assess.   #7 chronic kidney disease stage III: Monitor closely  #8 diabetes mellitus type 2 in obese patient:Sliding scale.   #9 generalized weakness with fall likely due to above mentioned medical concerns.Physical therapy  #10 Hypothyroidism    TSH is high- increase Thyroxin increased 100.  All the records are reviewed and case discussed with Care Management/Social Workerr. Management plans discussed with the patient, family and they are in agreement.  CODE STATUS: full  TOTAL TIME TAKING CARE OF THIS PATIENT: 25 minutes.   More than 50% of the visit was spent in counseling/coordination of care  POSSIBLE D/C IN 1-2 DAYS, DEPENDING ON CLINICAL CONDITION.   Dustin Flock M.D on 08/11/2014   Between 7am to 6pm - Pager - [phone]  After 6pm go to [email or web address] - password [email or web address] [facility]  [facility]  Office  [phone]  CC: Primary care physician; No primary care provider on file.

## 2014-08-12 ENCOUNTER — Encounter
Admission: EM | Disposition: A | Discharge status: Home / Self Care | Payer: Self-pay | Source: Home / Self Care | Attending: Internal Medicine

## 2014-08-12 HISTORY — PX: GIVENS CAPSULE STUDY: SHX5432

## 2014-08-12 LAB — GLUCOSE, CAPILLARY
Glucose-Capillary: 110 mg/dL — ABNORMAL HIGH (ref 65–99)
Glucose-Capillary: 150 mg/dL — ABNORMAL HIGH (ref 65–99)

## 2014-08-12 LAB — HEMOGLOBIN: HEMOGLOBIN: 8.3 g/dL — AB (ref 13.0–18.0)

## 2014-08-12 SURGERY — IMAGING PROCEDURE, GI TRACT, INTRALUMINAL, VIA CAPSULE

## 2014-08-12 MED ORDER — FERROUS SULFATE 325 (65 FE) MG PO TABS
325.0000 mg | ORAL_TABLET | Freq: Three times a day (TID) | ORAL | Status: DC
  Administered 2014-08-12 (×2): 325 mg via ORAL
  Filled 2014-08-12 (×3): qty 1

## 2014-08-12 MED ORDER — AMLODIPINE BESYLATE 10 MG PO TABS: 10.0000 mg | ORAL_TABLET | Freq: Every day | ORAL | Status: DC

## 2014-08-12 MED ORDER — HYDROXYZINE HCL 25 MG PO TABS
25.0000 mg | ORAL_TABLET | ORAL | Status: DC | PRN
  Administered 2014-08-12: 25 mg via ORAL
  Filled 2014-08-12: qty 1

## 2014-08-12 MED ORDER — FERROUS SULFATE 325 (65 FE) MG PO TABS: 325.0000 mg | ORAL_TABLET | Freq: Three times a day (TID) | ORAL | Status: DC

## 2014-08-12 NOTE — Consult Note (Signed)
Capsule study for heme positive stool and severe iron def anemia to be processed today and tonight:  I will read it tomorrow or Thursday and contact the patient.

## 2014-08-12 NOTE — Discharge Summary (Signed)
Derek Shelton, 74 y.o., DOB Jun 04, 1940, MRN [id number]. Admission date: 08/06/2014 Discharge Date 08/12/2014 Primary MD No primary care provider on file. Admitting Physician Derek Jewett, MD  Admission Diagnosis  Symptomatic anemia [D64.9]  Discharge Diagnosis   Principal Problem:   GI bleeding Active Problems:   Anemia   Bradycardia   Atrial fibrillation   Hyponatremia   Chronic diastolic heart failure   Diabetes   OSA (obstructive sleep apnea)         Derek Shelton is a 74 y.o. male with a known history of obstructive sleep apnea and not compliant with CPAP, chronic diastolic congestive heart failure, obesity, paroxysmal atrial fibrillation on Eliquis, diabetes and hypertension presents with several days of progressive shortness of breath and fatigue. He fell while walking to his bedroom and his wife found him on the floor, she was able to get him up to the bed and he rested and felt slightly better. On the day of admission he continued to be very short of breath and dizzy and was brought to the emergency room by his family. On presentation he is found to be bradycardic in the 40s and 50s and also anemic with a hemoglobin of 6.4. Patient has been on Eliquis for atrial fibrillation which was discontinued. He was transfused total of 2 units of packed RBCs during hospitalization. He underwent a EGD and a colonoscopy. Both of these showed no significant evidence of a GI bleed. Patient is getting a capsule endoscopy study today once he finishes that she'll be able to be discharged home. She also has a history of atrial fibrillation and was noted to be bradycardic and his Coreg was held his heart rate has been stable. Patient will need a close follow-up of his hemoglobin with his primary care provider I will also refer him to hematology as well. In terms of his atrial fibrillation his Eliquis has just been discontinued he will likely need to be started on aspirin in 7-10 days if  his hemoglobin remains stable             Consults  cardiology, gastroenterology  Significant Tests:  See full reports for all details    Dg Chest 2 View  08/06/2014   CLINICAL DATA:  Shortness of breath for 2 days  EXAM: CHEST - 2 VIEW  COMPARISON:  05/06/2014  FINDINGS: Cardiac shadow remains enlarged. The patchy infiltrates seen on the prior exam has resolved in the interval. No focal infiltrate or effusion is seen. No significant vascular congestion is noted. No acute bony abnormality seen.  IMPRESSION: Stable cardiomegaly.  No acute abnormality noted.   Electronically Signed   By: Derek Shelton M.D.   On: 08/06/2014 15:13   Ct Head Wo Contrast  08/06/2014   CLINICAL DATA:  Syncope, fall, on Eliquis  EXAM: CT HEAD WITHOUT CONTRAST  TECHNIQUE: Contiguous axial images were obtained from the base of the skull through the vertex without intravenous contrast.  COMPARISON:  None.  FINDINGS: No skull fracture is noted. Paranasal sinuses and mastoid air cells are unremarkable. No intracranial hemorrhage, mass effect or midline shift. No acute cortical infarction. Mild cerebral atrophy. Mild periventricular white matter decreased attenuation probable due to chronic small vessel ischemic changes. No mass lesion is noted on this unenhanced scan.  IMPRESSION: No acute intracranial abnormality. Mild cerebral atrophy. Mild periventricular chronic white matter disease.   Electronically Signed   By: Derek Shelton M.D.   On: 08/06/2014 16:29  Today   Subjective:   Derek Shelton feels much better denies any complaints  Blood pressure 130/46, pulse 54, temperature 97.6 F (36.4 C), temperature source Oral, resp. rate 20, height 5\' 6"  (1.676 m), weight 94.938 kg (209 lb 4.8 oz), SpO2 98 %.  .  Intake/Output Summary (Last 24 hours) at 08/12/14 1319 Last data filed at 08/12/14 1200  Gross per 24 hour  Intake      0 ml  Output    500 ml  Net   -500 ml    Exam VITAL SIGNS: Blood pressure  130/46, pulse 54, temperature 97.6 F (36.4 C), temperature source Oral, resp. rate 20, height 5\' 6"  (1.676 m), weight 94.938 kg (209 lb 4.8 oz), SpO2 98 %.  GENERAL:  74 y.o.-year-old patient lying in the bed with no acute distress.  EYES: Pupils equal, round, reactive to light and accommodation. No scleral icterus. Extraocular muscles intact.  HEENT: Head atraumatic, normocephalic. Oropharynx and nasopharynx clear.  NECK:  Supple, no jugular venous distention. No thyroid enlargement, no tenderness.  LUNGS: Normal breath sounds bilaterally, no wheezing, rales,rhonchi or crepitation. No use of accessory muscles of respiration.  CARDIOVASCULAR: S1, S2 normal. No murmurs, rubs, or gallops.  ABDOMEN: Soft, nontender, nondistended. Bowel sounds present. No organomegaly or mass.  EXTREMITIES: No pedal edema, cyanosis, or clubbing.  NEUROLOGIC: Cranial nerves II through XII are intact. Muscle strength 5/5 in all extremities. Sensation intact. Gait not checked.  PSYCHIATRIC: The patient is alert and oriented x 3.  SKIN: No obvious rash, lesion, or ulcer.   Data Review     CBC w Diff: Lab Results  Component Value Date   WBC 6.7 08/11/2014   WBC 5.6 05/07/2014   HGB 8.3* 08/12/2014   HGB 8.5* 05/11/2014   HCT 25.0* 08/11/2014   HCT 26.2* 05/07/2014   PLT 204 08/11/2014   PLT 236 05/07/2014   LYMPHOPCT 8.2 05/07/2014   MONOPCT 11.7 05/07/2014   EOSPCT 0.0 05/07/2014   BASOPCT 0.1 05/07/2014   CMP: Lab Results  Component Value Date   NA 134* 08/11/2014   NA 132* 05/11/2014   K 3.6 08/11/2014   K 3.8 05/11/2014   CL 98* 08/11/2014   CL 96* 05/11/2014   CO2 28 08/11/2014   CO2 31 05/11/2014   BUN 12 08/11/2014   BUN 36* 05/11/2014   CREATININE 1.34* 08/11/2014   CREATININE 1.39* 05/11/2014   PROT 7.3 08/06/2014   PROT 7.6 05/06/2014   ALBUMIN 3.8 08/06/2014   ALBUMIN 4.0 05/06/2014   BILITOT 0.8 08/06/2014   BILITOT 0.7 05/06/2014   ALKPHOS 60 08/06/2014   ALKPHOS 57  05/06/2014   AST 24 08/06/2014   AST 59* 05/06/2014   ALT 16* 08/06/2014   ALT 31 05/06/2014  .  Micro Results No results found for this or any previous visit (from the past 240 hour(s)).      Code Status Orders        Start     Ordered   08/06/14 1822  Full code   Continuous     08/06/14 1821          Follow-up Information    Follow up with Lavera Guise, MD In 7 days.   Specialty:  Internal Medicine   Why:  Tuesday, August 2nd at 1030am, [facility]   Contact information:   [place] [address] [phone]       Follow up with Gaylyn Cheers, MD In 3 days.   Specialty:  Gastroenterology   Why:  Dr Elliott's office will call you at home with appointment, ccs   Contact information:   [place] [address] [phone]       Follow up with Leia Alf, MD In 2 weeks.   Specialty:  Internal Medicine   Why:  Wednesday, August 3rd at 2pm, [employer] information:   [place] [id number] [phone]       Discharge Medications     Medication List    STOP taking these medications        carvedilol 3.125 MG tablet  Commonly known as:  COREG     ELIQUIS 5 MG Tabs tablet  Generic drug:  apixaban     meloxicam 15 MG tablet  Commonly known as:  MOBIC      TAKE these medications        Albuterol Sulfate 108 (90 BASE) MCG/ACT Aepb  Inhale into the lungs.     amiodarone 200 MG tablet  Commonly known as:  PACERONE  Take 200 mg by mouth daily.     amLODipine 10 MG tablet  Commonly known as:  NORVASC  Take 1 tablet (10 mg total) by mouth daily.     benazepril 10 MG tablet  Commonly known as:  LOTENSIN  Take 10 mg by mouth daily.     budesonide-formoterol 160-4.5 MCG/ACT inhaler  Commonly known as:  SYMBICORT  Inhale 1 puff into the lungs 2 (two) times daily.     donepezil 5 MG tablet  Commonly known as:  ARICEPT  Take 5 mg by mouth at bedtime.     DULoxetine 60 MG capsule   Commonly known as:  CYMBALTA  Take 60 mg by mouth daily.     esomeprazole 40 MG capsule  Commonly known as:  NEXIUM  Take 40 mg by mouth daily at 12 noon.     ferrous sulfate 325 (65 FE) MG tablet  Take 1 tablet (325 mg total) by mouth 3 (three) times daily with meals.     finasteride 5 MG tablet  Commonly known as:  PROSCAR  Take 5 mg by mouth daily.     furosemide 20 MG tablet  Commonly known as:  LASIX  Take 20 mg by mouth 2 (two) times daily.     glimepiride 2 MG tablet  Commonly known as:  AMARYL  Take 2 mg by mouth daily with breakfast.     hydrALAZINE 100 MG tablet  Commonly known as:  APRESOLINE  Take 100 mg by mouth 3 (three) times daily.     levothyroxine 50 MCG tablet  Commonly known as:  SYNTHROID, LEVOTHROID  Take 50 mcg by mouth daily before breakfast.     Melatonin 3 MG Tabs  Take by mouth.     metFORMIN 1000 MG tablet  Commonly known as:  GLUCOPHAGE  Take 1,000 mg by mouth 2 (two) times daily with a meal.     omega-3 acid ethyl esters 1 G capsule  Commonly known as:  LOVAZA  Take by mouth 2 (two) times daily.     tiotropium 18 MCG inhalation capsule  Commonly known as:  SPIRIVA  Place 18 mcg into inhaler and inhale daily.     traZODone 100 MG tablet  Commonly known as:  DESYREL  Take 100 mg by mouth at bedtime.           Total Time in preparing paper work, data evaluation and  todays exam - 35 minutes  Dustin Flock M.D on 08/12/2014 at 1:19 PM  [facility]   Office  [phone]

## 2014-08-12 NOTE — Care Management (Signed)
To discharge home today with home health nurse and physical therapy.  PCP is Lamonte Sakai and patient is current.  Agency preference is [facility].  Patient's son says that unless patient is seen every day  By physical therapy, he will not follow the home exercise program .  Says patient does not follw through with checking his blood sugars either.  Request that home health nurse draw the blood for the CBC that is ordered for Friday at MD office.Referral has been called to [facility]

## 2014-08-12 NOTE — Progress Notes (Signed)
   08/12/14 1150  Clinical Encounter Type  Visited With Patient and family together  Visit Type Follow-up  Referral From Chaplain  Consult/Referral To Chaplain  Stress Factors  Patient Stress Factors Health changes  Family Stress Factors Health changes  Met w/patient & family. Patient was alert and active. Family advised they will complete AD at home. They were advised to provide a copy to their primary care physician to include in med. record.   Chap. Cheyan Frees G. [place]

## 2014-08-12 NOTE — Discharge Instructions (Signed)
°  DIET:  Cardiac diet, diebetic diet  DISCHARGE CONDITION:  Stable  ACTIVITY:  Activity as tolerated  OXYGEN:  Home Oxygen: No.   Oxygen Delivery: room air  DISCHARGE LOCATION:  home    ADDITIONAL DISCHARGE INSTRUCTION:continue cpap at bedtime Check hemeglobin this Friday in dr. Humphrey Rolls office   If you experience worsening of your admission symptoms, develop shortness of breath, life threatening emergency, suicidal or homicidal thoughts you must seek medical attention immediately by calling 911 or calling your MD immediately  if symptoms less severe.  You Must read complete instructions/literature along with all the possible adverse reactions/side effects for all the Medicines you take and that have been prescribed to you. Take any new Medicines after you have completely understood and accpet all the possible adverse reactions/side effects.   Please note  You were cared for by a hospitalist during your hospital stay. If you have any questions about your discharge medications or the care you received while you were in the hospital after you are discharged, you can call the unit and asked to speak with the hospitalist on call if the hospitalist that took care of you is not available. Once you are discharged, your primary care physician will handle any further medical issues. Please note that NO REFILLS for any discharge medications will be authorized once you are discharged, as it is imperative that you return to your primary care physician (or establish a relationship with a primary care physician if you do not have one) for your aftercare needs so that they can reassess your need for medications and monitor your lab values.

## 2014-08-12 NOTE — Progress Notes (Signed)
Patient remains alert , family at bedside, VSS , received order for discharge, discharge instruction provided, IV removed tele removed, discharge instruction provided patient discharged home

## 2014-08-18 ENCOUNTER — Encounter: Payer: Self-pay | Admitting: Unknown Physician Specialty

## 2014-08-27 ENCOUNTER — Inpatient Hospital Stay: Payer: Medicare Other | Attending: Internal Medicine | Admitting: Internal Medicine

## 2014-08-27 VITALS — BP 113/65 | HR 107 | Temp 97.1°F | Resp 18 | Ht 66.0 in | Wt 190.3 lb

## 2014-08-27 DIAGNOSIS — F0390 Unspecified dementia without behavioral disturbance: Secondary | ICD-10-CM | POA: Diagnosis not present

## 2014-08-27 DIAGNOSIS — R531 Weakness: Secondary | ICD-10-CM

## 2014-08-27 DIAGNOSIS — I5041 Acute combined systolic (congestive) and diastolic (congestive) heart failure: Secondary | ICD-10-CM | POA: Diagnosis not present

## 2014-08-27 DIAGNOSIS — E119 Type 2 diabetes mellitus without complications: Secondary | ICD-10-CM

## 2014-08-27 DIAGNOSIS — M129 Arthropathy, unspecified: Secondary | ICD-10-CM | POA: Diagnosis not present

## 2014-08-27 DIAGNOSIS — I10 Essential (primary) hypertension: Secondary | ICD-10-CM | POA: Diagnosis not present

## 2014-08-27 DIAGNOSIS — E079 Disorder of thyroid, unspecified: Secondary | ICD-10-CM

## 2014-08-27 DIAGNOSIS — D509 Iron deficiency anemia, unspecified: Secondary | ICD-10-CM | POA: Diagnosis present

## 2014-08-27 DIAGNOSIS — R5383 Other fatigue: Secondary | ICD-10-CM | POA: Diagnosis not present

## 2014-08-27 DIAGNOSIS — F17210 Nicotine dependence, cigarettes, uncomplicated: Secondary | ICD-10-CM

## 2014-08-27 DIAGNOSIS — Z79899 Other long term (current) drug therapy: Secondary | ICD-10-CM

## 2014-08-27 DIAGNOSIS — G4730 Sleep apnea, unspecified: Secondary | ICD-10-CM

## 2014-08-27 DIAGNOSIS — K219 Gastro-esophageal reflux disease without esophagitis: Secondary | ICD-10-CM | POA: Diagnosis not present

## 2014-08-27 DIAGNOSIS — R0602 Shortness of breath: Secondary | ICD-10-CM | POA: Diagnosis not present

## 2014-08-27 DIAGNOSIS — I4891 Unspecified atrial fibrillation: Secondary | ICD-10-CM

## 2014-08-27 DIAGNOSIS — J449 Chronic obstructive pulmonary disease, unspecified: Secondary | ICD-10-CM

## 2014-08-27 NOTE — Progress Notes (Signed)
[place]  Telephone:(336) [phone] Fax:(336) [phone]     ID: Derek Shelton OB: [birth year]  MR#: [id number]  MWN#:[id number]  Patient Care Team: Perrin Maltese, MD as PCP - General (Internal Medicine)  CHIEF COMPLAINT/DIAGNOSIS:  Recently diagnosed iron deficiency anemia (hospitalized 08/06/14 with hemoglobin 6.4, Stool Hemoccult-positive, got packed red blood cell transfusion and 1 dose IV iron in hospital. On 08/06/14, serum B12 normal at 270, ferritin low at 5, iron saturation 2%, serum iron low at 11, TIBC 574. Currently on oral iron one tablet 3 times daily).  -  Patient referred here for hematology opinion.  HISTORY OF PRESENT ILLNESS:  Derek Shelton is a 74 year old gentleman with history of multiple medical problems as detailed below who recently noticed progressive fatigue, weakness, he was hospitalized about July 13 with fall/syncope and his hemoglobin was found to be 6.4 g. Review of Hospital record shows that he received packed red blood cell transfusion and hemoglobin improved to 8.3. Stool Hemoccult was positive, he was seen by GI and on July 15 had Colonoscopy which reported internal hemorrhoids and EGD which reported erythematous mucosa in gastric body and antrum. On 08/06/14, serum B12 normal at 270, ferritin low at  5, iron saturation 2%, serum iron low at 11, TIBC 574, he reportedly received 1 dose of IV iron in the hospital. Patient is accompanied by his son Derek Shelton today who works for [employer] in [place] and is quite knowledgeable about the patient's history and lab results, states that patient had repeat hemoglobin 1 week ago by Dr. Humphrey Rolls his primary physician and it was up to around 10.9. Also states that patient had capsule endoscopy done which reportedly showed 1 possible bleeding spot in the small bowel. Eliquis has been discontinued. Patient states that he is tolerating oral iron one tablet 3 times a day without any major constipation or other side  effects. States that he has chronic fatigue and dyspnea on exertion which is at baseline and overall he is feeling better since his hemoglobin is improving. He currently denies chest pain, palpitations, dizziness, dyspnea at rest, orthopnea or PND. He denies any obvious bleeding symptoms including bright red blood in stools, hematuria or other. He denies any new or progressive bone pains. Appetite is better.  REVIEW OF SYSTEMS:   ROS CONSTITUTIONAL: As in HPI above. No chills, fever or sweats.    ENT:  No epistaxis. No ear or jaw pain. No sinus symptoms. RESPIRATORY:   As in history of present illness. No wheezing. No hemoptysis. CARDIAC:  As in history of present illness. GI:  No abdominal pain, nausea or vomiting. No diarrhea.   GU:  No dysuria or hematuria.  SKIN: No rashes or pruritus. HEMATOLOGIC: denies bleeding symptoms MUSCULOSKELETAL:  Chronic arthritis mainly knees, unchanged. Denies new bone pains.  EXTREMITY:  Intermittent swelling. Denies pain.  NEURO: Chronic memory problems from dementia, intermittent confusion. No focal weakness. No numbness or tingling of extremities.  No seizures.   ENDOCRINE:  No polyuria or polydipsia.   PAST MEDICAL HISTORY: Reviewed. Past Medical History  Diagnosis Date  . Hypertension   . Sleep apnea   . Dementia     Per son's report  . Diabetes mellitus without complication   . Thyroid disease   . GERD (gastroesophageal reflux disease)   . Atrial fibrillation   . COPD (chronic obstructive pulmonary disease)   . CHF (congestive heart failure)   . Shortness of breath dyspnea   . Anemia   08/08/14 -  Colonoscopy reported internal hemorrhoids and EGD reported erythematous mucosa in gastric body and antrum.  PAST SURGICAL HISTORY: Reviewed. Past Surgical History  Procedure Laterality Date  . Cardiac catheterization    . Eye surgery    . Rectal fistula N/A   . Esophagogastroduodenoscopy N/A 08/08/2014    Procedure: ESOPHAGOGASTRODUODENOSCOPY  (EGD);  Surgeon: Manya Silvas, MD;  Location: [facility] ENDOSCOPY;  Service: Endoscopy;  Laterality: N/A;  plan for early afternoon  . Colonoscopy N/A 08/10/2014    Procedure: COLONOSCOPY;  Surgeon: Manya Silvas, MD;  Location: [facility] ENDOSCOPY;  Service: Endoscopy;  Laterality: N/A;  . Givens capsule study N/A 08/12/2014    Procedure: GIVENS CAPSULE STUDY;  Surgeon: Manya Silvas, MD;  Location: [facility] ENDOSCOPY;  Service: Endoscopy;  Laterality: N/A;    FAMILY HISTORY: Reviewed. Family History  Problem Relation Age of Onset  . Diabetes Mother     SOCIAL HISTORY: Reviewed. History  Substance Use Topics  . Smoking status: Never Smoker   . Smokeless tobacco: Current User    Types: Chew  . Alcohol Use: No    Allergies  Allergen Reactions  . Penicillins     Current Outpatient Prescriptions  Medication Sig Dispense Refill  . amiodarone (PACERONE) 200 MG tablet Take 200 mg by mouth daily.    Marland Kitchen amLODipine (NORVASC) 10 MG tablet Take 1 tablet (10 mg total) by mouth daily. 30 tablet 0  . benazepril (LOTENSIN) 10 MG tablet Take 10 mg by mouth daily.    . budesonide-formoterol (SYMBICORT) 160-4.5 MCG/ACT inhaler Inhale 1 puff into the lungs 2 (two) times daily.    Marland Kitchen donepezil (ARICEPT) 5 MG tablet Take 5 mg by mouth at bedtime.    . DULoxetine (CYMBALTA) 60 MG capsule Take 60 mg by mouth daily.    Marland Kitchen esomeprazole (NEXIUM) 40 MG capsule Take 40 mg by mouth daily at 12 noon.    . ferrous sulfate 325 (65 FE) MG tablet Take 1 tablet (325 mg total) by mouth 3 (three) times daily with meals. 90 tablet 1  . furosemide (LASIX) 20 MG tablet Take 20 mg by mouth 2 (two) times daily.    Marland Kitchen glimepiride (AMARYL) 2 MG tablet Take 2 mg by mouth daily with breakfast.    . hydrALAZINE (APRESOLINE) 100 MG tablet Take 100 mg by mouth 3 (three) times daily.    Marland Kitchen levothyroxine (SYNTHROID, LEVOTHROID) 75 MCG tablet Take 75 mcg by mouth daily before breakfast.    . Melatonin 3 MG TABS Take by mouth.    .  omega-3 acid ethyl esters (LOVAZA) 1 G capsule Take by mouth 2 (two) times daily.    . Albuterol Sulfate 108 (90 BASE) MCG/ACT AEPB Inhale into the lungs.    . finasteride (PROSCAR) 5 MG tablet Take 5 mg by mouth daily.    Marland Kitchen levothyroxine (SYNTHROID, LEVOTHROID) 50 MCG tablet Take 50 mcg by mouth daily before breakfast.    . metFORMIN (GLUCOPHAGE) 1000 MG tablet Take 1,000 mg by mouth 2 (two) times daily with a meal.    . tiotropium (SPIRIVA) 18 MCG inhalation capsule Place 18 mcg into inhaler and inhale daily.    . traZODone (DESYREL) 100 MG tablet Take 100 mg by mouth at bedtime.     No current facility-administered medications for this visit.    PHYSICAL EXAM: Filed Vitals:   08/27/14 1457  BP: 113/65  Pulse: 107  Temp: 97.1 F (36.2 C)  Resp: 18     Body mass index is 30.72  kg/(m^2).  GENERAL: Patient is alert and oriented and in no acute distress. There is no icterus. HEENT: EOMs intact. Oral exam negative for thrush or lesions. No cervical lymphadenopathy. CVS: S1S2, regular LUNGS: Bilaterally clear to auscultation, no crepitations or rhonchi. ABDOMEN: Soft, nontender. No hepatosplenomegaly clinically.  NEURO: grossly nonfocal, cranial nerves are intact. Gait slow, uses a cane. EXTREMITIES: Mild pedal edema. LYMPHATICS: No palpable adenopathy in axillary or inguinal areas. SKIN: No major bruising or generalized rash   LAB RESULTS: 08/06/14 - Hb 6.4, serum B12 normal at 270, ferritin low at 5, iron saturation 2%, serum iron low at 11, TIBC 574.    Component Value Date/Time   NA 134* 08/11/2014 0440   NA 132* 05/11/2014 0505   K 3.6 08/11/2014 0440   K 3.8 05/11/2014 0505   CL 98* 08/11/2014 0440   CL 96* 05/11/2014 0505   CO2 28 08/11/2014 0440   CO2 31 05/11/2014 0505   GLUCOSE 108* 08/11/2014 0440   GLUCOSE 125* 05/11/2014 0505   BUN 12 08/11/2014 0440   BUN 36* 05/11/2014 0505   CREATININE 1.34* 08/11/2014 0440   CREATININE 1.39* 05/11/2014 0505   CALCIUM 8.8*  08/11/2014 0440   CALCIUM 8.0* 05/11/2014 0505   PROT 7.3 08/06/2014 1436   PROT 7.6 05/06/2014 1410   ALBUMIN 3.8 08/06/2014 1436   ALBUMIN 4.0 05/06/2014 1410   AST 24 08/06/2014 1436   AST 59* 05/06/2014 1410   ALT 16* 08/06/2014 1436   ALT 31 05/06/2014 1410   ALKPHOS 60 08/06/2014 1436   ALKPHOS 57 05/06/2014 1410   BILITOT 0.8 08/06/2014 1436   BILITOT 0.7 05/06/2014 1410   GFRNONAA 50* 08/11/2014 0440   GFRNONAA 49* 05/11/2014 0505   GFRAA 58* 08/11/2014 0440   GFRAA 57* 05/11/2014 0505    Lab Results  Component Value Date   WBC 6.7 08/11/2014   NEUTROABS 4.5 05/07/2014   HGB 8.3* 08/12/2014   HCT 25.0* 08/11/2014   MCV 73.5* 08/11/2014   PLT 204 08/11/2014    Lab Results  Component Value Date   IRON 11* 08/06/2014     ASSESSMENT / PLAN:   Recently diagnosed iron deficiency anemia (hospitalized 08/06/14 with hemoglobin 6.4, Stool Hemoccult-positive, got packed red blood cell transfusion and 1 dose IV iron in hospital. On 08/06/14, serum B12 normal at 270, ferritin low at 5, iron saturation 2%, serum iron low at 11, TIBC 574. Currently on oral iron one tablet 3 times daily)  -  patient referred here for hematology opinion. Have reviewed records from referring physician and recent hospitalization, recent labs and discussed with patient and his son present. He did have significant anemia recently secondary to obvious severe iron deficiency, otherwise no leukopenia or thrombocytopenia was noted. Currently he seems to be tolerating oral iron 3 times daily without any side effects, also clinically feeling better. Reportedly hemoglobin done 1 week ago had improved up to 10.9. Given this, recommend continuing oral iron and monitor for response. They prefer continued monitoring of anemia to be done by primary physician, patient is therefore discharged from our clinic at this time. Recommend monitoring hemoglobin once every 3-4 weeks, repeat iron studies once every 10-12 weeks. I  would be happy to see him back in the future if he develops recurrent iron deficiency anemia or intolerance to oral iron therapy, or if other hematology issues should occur.     In between visits, the patient has been advised to call or come to the ER in case  of bleeding, acute sickness or worsening symptoms. They are agreeable to this plan.    Leia Alf, MD   08/27/2014 5:06 PM

## 2014-08-27 NOTE — Progress Notes (Signed)
Patient was discharged from the hospital on July 19th. Patient's son states that patient fell at home and hit his head. Patient's family took him to the ER the next day and they found that he had some microscopic blood in his stool and his Hgb was found to be around 6.1. He was given two blood transfusions and a dose of IV Iron. Patient is doing much better since being discharged. He was seen by his PCP, Dr. Humphrey Rolls this week and his Hgb was 10.9.

## 2014-09-23 DIAGNOSIS — E785 Hyperlipidemia, unspecified: Secondary | ICD-10-CM | POA: Insufficient documentation

## 2016-04-04 ENCOUNTER — Encounter: Payer: Self-pay | Admitting: *Deleted

## 2016-04-05 ENCOUNTER — Encounter: Payer: Self-pay | Admitting: *Deleted

## 2016-04-05 ENCOUNTER — Encounter
Admission: RE | Disposition: A | Discharge status: Home / Self Care | Payer: Self-pay | Source: Ambulatory Visit | Attending: Gastroenterology

## 2016-04-05 ENCOUNTER — Ambulatory Visit
Admission: RE | Admit: 2016-04-05 | Discharge: 2016-04-05 | Disposition: A | Discharge status: Home / Self Care | Payer: Medicare Other | Source: Ambulatory Visit | Attending: Gastroenterology | Admitting: Gastroenterology

## 2016-04-05 ENCOUNTER — Ambulatory Visit: Payer: Medicare Other | Admitting: *Deleted

## 2016-04-05 DIAGNOSIS — E785 Hyperlipidemia, unspecified: Secondary | ICD-10-CM | POA: Insufficient documentation

## 2016-04-05 DIAGNOSIS — K640 First degree hemorrhoids: Secondary | ICD-10-CM | POA: Diagnosis not present

## 2016-04-05 DIAGNOSIS — Z1211 Encounter for screening for malignant neoplasm of colon: Secondary | ICD-10-CM | POA: Diagnosis not present

## 2016-04-05 DIAGNOSIS — Z7982 Long term (current) use of aspirin: Secondary | ICD-10-CM | POA: Insufficient documentation

## 2016-04-05 DIAGNOSIS — G4730 Sleep apnea, unspecified: Secondary | ICD-10-CM | POA: Diagnosis not present

## 2016-04-05 DIAGNOSIS — E119 Type 2 diabetes mellitus without complications: Secondary | ICD-10-CM | POA: Insufficient documentation

## 2016-04-05 DIAGNOSIS — Z880 Allergy status to penicillin: Secondary | ICD-10-CM | POA: Insufficient documentation

## 2016-04-05 DIAGNOSIS — Z79899 Other long term (current) drug therapy: Secondary | ICD-10-CM | POA: Insufficient documentation

## 2016-04-05 DIAGNOSIS — I4891 Unspecified atrial fibrillation: Secondary | ICD-10-CM | POA: Insufficient documentation

## 2016-04-05 DIAGNOSIS — Z6832 Body mass index (BMI) 32.0-32.9, adult: Secondary | ICD-10-CM | POA: Diagnosis not present

## 2016-04-05 DIAGNOSIS — I110 Hypertensive heart disease with heart failure: Secondary | ICD-10-CM | POA: Diagnosis not present

## 2016-04-05 DIAGNOSIS — K635 Polyp of colon: Secondary | ICD-10-CM | POA: Insufficient documentation

## 2016-04-05 DIAGNOSIS — Z87891 Personal history of nicotine dependence: Secondary | ICD-10-CM | POA: Diagnosis not present

## 2016-04-05 DIAGNOSIS — E669 Obesity, unspecified: Secondary | ICD-10-CM | POA: Diagnosis not present

## 2016-04-05 DIAGNOSIS — I509 Heart failure, unspecified: Secondary | ICD-10-CM | POA: Diagnosis not present

## 2016-04-05 DIAGNOSIS — I2510 Atherosclerotic heart disease of native coronary artery without angina pectoris: Secondary | ICD-10-CM | POA: Insufficient documentation

## 2016-04-05 DIAGNOSIS — K219 Gastro-esophageal reflux disease without esophagitis: Secondary | ICD-10-CM | POA: Diagnosis not present

## 2016-04-05 DIAGNOSIS — D649 Anemia, unspecified: Secondary | ICD-10-CM | POA: Insufficient documentation

## 2016-04-05 DIAGNOSIS — F0390 Unspecified dementia without behavioral disturbance: Secondary | ICD-10-CM | POA: Insufficient documentation

## 2016-04-05 DIAGNOSIS — Z955 Presence of coronary angioplasty implant and graft: Secondary | ICD-10-CM | POA: Insufficient documentation

## 2016-04-05 DIAGNOSIS — J449 Chronic obstructive pulmonary disease, unspecified: Secondary | ICD-10-CM | POA: Diagnosis not present

## 2016-04-05 DIAGNOSIS — Z7984 Long term (current) use of oral hypoglycemic drugs: Secondary | ICD-10-CM | POA: Insufficient documentation

## 2016-04-05 DIAGNOSIS — K6389 Other specified diseases of intestine: Secondary | ICD-10-CM | POA: Diagnosis not present

## 2016-04-05 DIAGNOSIS — K5730 Diverticulosis of large intestine without perforation or abscess without bleeding: Secondary | ICD-10-CM | POA: Diagnosis not present

## 2016-04-05 DIAGNOSIS — E039 Hypothyroidism, unspecified: Secondary | ICD-10-CM | POA: Insufficient documentation

## 2016-04-05 HISTORY — DX: Hypothyroidism, unspecified: E03.9

## 2016-04-05 HISTORY — PX: COLONOSCOPY WITH PROPOFOL: SHX5780

## 2016-04-05 HISTORY — DX: Unspecified asthma, uncomplicated: J45.909

## 2016-04-05 HISTORY — DX: Cardiac arrhythmia, unspecified: I49.9

## 2016-04-05 HISTORY — DX: Hyperlipidemia, unspecified: E78.5

## 2016-04-05 HISTORY — DX: Atherosclerotic heart disease of native coronary artery without angina pectoris: I25.10

## 2016-04-05 LAB — GLUCOSE, CAPILLARY: GLUCOSE-CAPILLARY: 137 mg/dL — AB (ref 65–99)

## 2016-04-05 SURGERY — COLONOSCOPY WITH PROPOFOL
Anesthesia: General

## 2016-04-05 MED ORDER — EPHEDRINE SULFATE 50 MG/ML IJ SOLN
INTRAMUSCULAR | Status: DC | PRN
  Administered 2016-04-05: 10 mg via INTRAVENOUS

## 2016-04-05 MED ORDER — ARTIFICIAL TEARS OP OINT
TOPICAL_OINTMENT | OPHTHALMIC | Status: AC
  Filled 2016-04-05: qty 3.5

## 2016-04-05 MED ORDER — SODIUM CHLORIDE 0.9 % IV SOLN
INTRAVENOUS | Status: DC
  Administered 2016-04-05: 1000 mL via INTRAVENOUS

## 2016-04-05 MED ORDER — SODIUM CHLORIDE 0.9 % IV SOLN
INTRAVENOUS | Status: DC
  Administered 2016-04-05 (×2): via INTRAVENOUS

## 2016-04-05 MED ORDER — EPHEDRINE SULFATE 50 MG/ML IJ SOLN
INTRAMUSCULAR | Status: AC
  Filled 2016-04-05: qty 1

## 2016-04-05 MED ORDER — PROPOFOL 10 MG/ML IV BOLUS
INTRAVENOUS | Status: AC
  Filled 2016-04-05: qty 20

## 2016-04-05 MED ORDER — PROPOFOL 500 MG/50ML IV EMUL
INTRAVENOUS | Status: AC
  Filled 2016-04-05: qty 50

## 2016-04-05 MED ORDER — PROPOFOL 500 MG/50ML IV EMUL
INTRAVENOUS | Status: DC | PRN
  Administered 2016-04-05: 50 ug/kg/min via INTRAVENOUS

## 2016-04-05 MED ORDER — SUGAMMADEX SODIUM 200 MG/2ML IV SOLN
INTRAVENOUS | Status: AC
  Filled 2016-04-05: qty 2

## 2016-04-05 NOTE — H&P (Signed)
Outpatient short stay form Pre-procedure 04/05/2016 7:28 AM Lollie Sails MD  Primary Physician: Dr Lamonte Sakai  Reason for visit:  Colonoscopy  History of present illness:  Patient is a 76 year old male presenting today as above. He has a history of iron deficiency anemia with a finding of occult blood in stools in the past. He had a GI bleed for which she was admitted to the hospital and evaluation indicated a likely obscure reason for this. He also had a positive cologard test since he had his last colonoscopy about 2 years ago. He tolerated his prep well. He does take 81 mg aspirin daily. Takes no other aspirin products or blood thinning agents.    Current Facility-Administered Medications:  .  0.9 %  sodium chloride infusion, , Intravenous, Continuous, Lollie Sails, MD .  0.9 %  sodium chloride infusion, , Intravenous, Continuous, Lollie Sails, MD  Prescriptions Prior to Admission  Medication Sig Dispense Refill Last Dose  . aspirin EC 81 MG tablet Take 81 mg by mouth daily.     Marland Kitchen dutasteride (AVODART) 0.5 MG capsule Take 0.5 mg by mouth daily.     . ergocalciferol (VITAMIN D2) 50000 units capsule Take 50,000 Units by mouth once a week.     . metoprolol succinate (TOPROL-XL) 25 MG 24 hr tablet Take 25 mg by mouth daily.   04/04/2016 at 1500  . Albuterol Sulfate 108 (90 BASE) MCG/ACT AEPB Inhale into the lungs.   Not Taking  . amiodarone (PACERONE) 200 MG tablet Take 200 mg by mouth daily.   Taking  . amLODipine (NORVASC) 10 MG tablet Take 1 tablet (10 mg total) by mouth daily. 30 tablet 0 Taking  . benazepril (LOTENSIN) 10 MG tablet Take 10 mg by mouth daily.   Taking  . budesonide-formoterol (SYMBICORT) 160-4.5 MCG/ACT inhaler Inhale 1 puff into the lungs 2 (two) times daily.   Taking  . donepezil (ARICEPT) 5 MG tablet Take 5 mg by mouth at bedtime.   Taking  . DULoxetine (CYMBALTA) 60 MG capsule Take 60 mg by mouth daily.   Taking  . esomeprazole (NEXIUM) 40 MG capsule  Take 40 mg by mouth daily at 12 noon.   Taking  . ferrous sulfate 325 (65 FE) MG tablet Take 1 tablet (325 mg total) by mouth 3 (three) times daily with meals. 90 tablet 1 Taking  . finasteride (PROSCAR) 5 MG tablet Take 5 mg by mouth daily.   Not Taking  . furosemide (LASIX) 20 MG tablet Take 20 mg by mouth 2 (two) times daily.   Taking  . glimepiride (AMARYL) 2 MG tablet Take 2 mg by mouth daily with breakfast.   Taking  . hydrALAZINE (APRESOLINE) 100 MG tablet Take 100 mg by mouth 3 (three) times daily.   Taking  . levothyroxine (SYNTHROID, LEVOTHROID) 50 MCG tablet Take 50 mcg by mouth daily before breakfast.   Not Taking  . levothyroxine (SYNTHROID, LEVOTHROID) 75 MCG tablet Take 75 mcg by mouth daily before breakfast.   Taking  . Melatonin 3 MG TABS Take by mouth.   Taking  . metFORMIN (GLUCOPHAGE) 1000 MG tablet Take 1,000 mg by mouth 2 (two) times daily with a meal.   Not Taking  . omega-3 acid ethyl esters (LOVAZA) 1 G capsule Take by mouth 2 (two) times daily.   Taking  . tiotropium (SPIRIVA) 18 MCG inhalation capsule Place 18 mcg into inhaler and inhale daily.   Not Taking  . traZODone (DESYREL) 100  MG tablet Take 100 mg by mouth at bedtime.   Not Taking     Allergies  Allergen Reactions  . Catapres [Clonidine Hcl]   . Coreg [Carvedilol]   . Penicillins      Past Medical History:  Diagnosis Date  . Anemia   . Asthma   . CHF (congestive heart failure) ([place])   . COPD (chronic obstructive pulmonary disease) ([place])   . Coronary artery disease   . Dementia    Per son's report  . Diabetes mellitus without complication ([place])   . Dysrhythmia    Atrial Fibrillation  . GERD (gastroesophageal reflux disease)   . Hyperlipidemia   . Hypertension   . Hypothyroidism   . Shortness of breath dyspnea   . Sleep apnea   . Thyroid disease     Review of systems:      Physical Exam    Heart and lungs: Regular rate and rhythm without rub or gallop, lungs are bilaterally clear.     HEENT: Normocephalic atraumatic eyes are anicteric    Other:     Pertinant exam for procedure: Soft nontender nondistended bowel sounds positive normoactive.    Planned proceedures: Colonoscopy and indicated procedures.    Lollie Sails, MD Gastroenterology 04/05/2016  7:28 AM

## 2016-04-05 NOTE — Anesthesia Post-op Follow-up Note (Cosign Needed)
Anesthesia QCDR form completed.        

## 2016-04-05 NOTE — Op Note (Addendum)
[facility] Gastroenterology Patient Name: Derek Shelton Procedure Date: 04/05/2016 7:26 AM MRN: [id number] Account #: [id number] Date of Birth: 1940/12/11 Admit Type: Outpatient Age: 76 Room: [facility] ENDO ROOM 3 Gender: Male Note Status: Finalized Procedure:            Colonoscopy Indications:          Heme positive stool, Positive Cologuard test Providers:            Lollie Sails, MD Referring MD:         Perrin Maltese, MD (Referring MD) Medicines:            Monitored Anesthesia Care Complications:        No immediate complications. Procedure:            Pre-Anesthesia Assessment:                       - ASA Grade Assessment: III - A patient with severe                        systemic disease.                       After obtaining informed consent, the colonoscope was                        passed under direct vision. Throughout the procedure,                        the patient's blood pressure, pulse, and oxygen                        saturations were monitored continuously. The                        Colonoscope was introduced through the anus and                        advanced to the the cecum, identified by appendiceal                        orifice and ileocecal valve. The colonoscopy was                        performed with moderate difficulty due to poor bowel                        prep. Successful completion of the procedure was aided                        by lavage. The quality of the bowel preparation was                        fair. Findings:      Multiple medium-mouthed diverticula were found in the sigmoid colon.      A 2 mm polyp was found in the recto-sigmoid colon. The polyp was       sessile. The polyp was removed with a cold biopsy forceps. Resection and       retrieval were complete.      A 4 mm polyp was found in the recto-sigmoid colon. The polyp was  sessile. The polyp was removed with a cold snare. Resection and   retrieval were complete.      A localized area of mildly granular mucosa was found at the ileocecal       valve. Biopsies were taken with a cold forceps for histology.      Three sessile polyps were found in the cecum. The polyps were 1 to 2 mm       in size.      The digital rectal exam was normal.      Non-bleeding internal hemorrhoids were found during retroflexion. The       hemorrhoids were small and Grade I (internal hemorrhoids that do not       prolapse). Impression:           - Preparation of the colon was fair.                       - Diverticulosis in the sigmoid colon.                       - One 2 mm polyp at the recto-sigmoid colon, removed                        with a cold biopsy forceps. Resected and retrieved.                       - One 4 mm polyp at the recto-sigmoid colon, removed                        with a cold snare. Resected and retrieved.                       - Granular mucosa at the ileocecal valve. Biopsied.                       - Three 1 to 2 mm polyps in the cecum.                       - Non-bleeding internal hemorrhoids. Recommendation:       - Discharge patient to home. Procedure Code(s):    --- Professional ---                       [phone], Colonoscopy, flexible; with removal of tumor(s),                        polyp(s), or other lesion(s) by snare technique                       45380, 59, Colonoscopy, flexible; with biopsy, single                        or multiple Diagnosis Code(s):    --- Professional ---                       K64.0, First degree hemorrhoids                       D12.7, Benign neoplasm of rectosigmoid junction  K63.89, Other specified diseases of intestine                       D12.0, Benign neoplasm of cecum                       R19.5, Other fecal abnormalities                       K57.30, Diverticulosis of large intestine without                        perforation or abscess without bleeding CPT  copyright 2016 American Medical Association. All rights reserved. The codes documented in this report are preliminary and upon coder review may  be revised to meet current compliance requirements. Lollie Sails, MD 04/05/2016 8:21:46 AM This report has been signed electronically. Number of Addenda: 0 Note Initiated On: 04/05/2016 7:26 AM Scope Withdrawal Time: 0 hours 12 minutes 5 seconds  Total Procedure Duration: 0 hours 22 minutes 36 seconds       [facility]

## 2016-04-05 NOTE — Anesthesia Postprocedure Evaluation (Signed)
Anesthesia Post Note  Patient: Derek Shelton  Procedure(s) Performed: Procedure(s) (LRB): COLONOSCOPY WITH PROPOFOL (N/A)  Patient location during evaluation: Endoscopy Anesthesia Type: General Level of consciousness: awake and alert and oriented Pain management: pain level controlled Vital Signs Assessment: post-procedure vital signs reviewed and stable Respiratory status: spontaneous breathing, nonlabored ventilation and respiratory function stable Cardiovascular status: blood pressure returned to baseline and stable Postop Assessment: no signs of nausea or vomiting Anesthetic complications: no     Last Vitals:  Vitals:   04/05/16 0840 04/05/16 0850  BP: 123/68 119/78  Pulse: 61 65  Resp: 14 16  Temp:      Last Pain:  Vitals:   04/05/16 0820  TempSrc: Tympanic                 Bridgit Eynon

## 2016-04-05 NOTE — Transfer of Care (Signed)
Immediate Anesthesia Transfer of Care Note  Patient: Rexene Edison  Procedure(s) Performed: Procedure(s): COLONOSCOPY WITH PROPOFOL (N/A)  Patient Location: PACU  Anesthesia Type:General  Level of Consciousness: awake, alert  and oriented  Airway & Oxygen Therapy: Patient Spontanous Breathing and Patient connected to nasal cannula oxygen  Post-op Assessment: Report given to RN and Post -op Vital signs reviewed and stable  Post vital signs: Reviewed and stable  Last Vitals:  Vitals:   04/05/16 0723 04/05/16 0820  BP: (!) 142/61 106/62  Pulse:  71  Resp: 16 13  Temp:  36.4 C    Last Pain:  Vitals:   04/05/16 0820  TempSrc: Tympanic         Complications: No apparent anesthesia complications

## 2016-04-05 NOTE — Anesthesia Preprocedure Evaluation (Signed)
Anesthesia Evaluation  Patient identified by MRN, date of birth, ID band Patient awake    Reviewed: Allergy & Precautions, NPO status , Patient's Chart, lab work & pertinent test results  History of Anesthesia Complications Negative for: history of anesthetic complications  Airway Mallampati: II  TM Distance: >3 FB Neck ROM: Full    Dental  (+) Poor Dentition   Pulmonary asthma , sleep apnea and Continuous Positive Airway Pressure Ventilation , COPD,  COPD inhaler, former smoker,    breath sounds clear to auscultation- rhonchi (-) wheezing      Cardiovascular hypertension, + CAD and +CHF  (-) Cardiac Stents and (-) CABG Dysrhythmias: hx of afib.  Rhythm:Regular Rate:Normal - Systolic murmurs and - Diastolic murmurs    Neuro/Psych PSYCHIATRIC DISORDERS (dementia) negative neurological ROS     GI/Hepatic Neg liver ROS, GERD  ,  Endo/Other  diabetes, Oral Hypoglycemic AgentsHypothyroidism   Renal/GU negative Renal ROS     Musculoskeletal negative musculoskeletal ROS (+)   Abdominal (+) + obese,   Peds  Hematology  (+) anemia ,   Anesthesia Other Findings Past Medical History: No date: Anemia No date: Asthma No date: CHF (congestive heart failure) (HCC) No date: COPD (chronic obstructive pulmonary disease) (* No date: Coronary artery disease No date: Dementia     Comment: Per son's report No date: Diabetes mellitus without complication (HCC) No date: Dysrhythmia     Comment: Atrial Fibrillation No date: GERD (gastroesophageal reflux disease) No date: Hyperlipidemia No date: Hypertension No date: Hypothyroidism No date: Shortness of breath dyspnea No date: Sleep apnea No date: Thyroid disease   Reproductive/Obstetrics                             Anesthesia Physical Anesthesia Plan  ASA: III  Anesthesia Plan: General   Post-op Pain Management:    Induction:  Intravenous  Airway Management Planned: Natural Airway  Additional Equipment:   Intra-op Plan:   Post-operative Plan:   Informed Consent: I have reviewed the patients History and Physical, chart, labs and discussed the procedure including the risks, benefits and alternatives for the proposed anesthesia with the patient or authorized representative who has indicated his/her understanding and acceptance.   Dental advisory given  Plan Discussed with: CRNA and Anesthesiologist  Anesthesia Plan Comments:         Anesthesia Quick Evaluation

## 2016-04-07 LAB — SURGICAL PATHOLOGY

## 2017-02-07 ENCOUNTER — Other Ambulatory Visit: Payer: Self-pay

## 2017-02-07 ENCOUNTER — Emergency Department: Payer: Medicare Other

## 2017-02-07 DIAGNOSIS — F17220 Nicotine dependence, chewing tobacco, uncomplicated: Secondary | ICD-10-CM | POA: Diagnosis present

## 2017-02-07 DIAGNOSIS — I130 Hypertensive heart and chronic kidney disease with heart failure and stage 1 through stage 4 chronic kidney disease, or unspecified chronic kidney disease: Secondary | ICD-10-CM | POA: Diagnosis present

## 2017-02-07 DIAGNOSIS — Z7982 Long term (current) use of aspirin: Secondary | ICD-10-CM

## 2017-02-07 DIAGNOSIS — I481 Persistent atrial fibrillation: Principal | ICD-10-CM | POA: Diagnosis present

## 2017-02-07 DIAGNOSIS — I340 Nonrheumatic mitral (valve) insufficiency: Secondary | ICD-10-CM | POA: Diagnosis present

## 2017-02-07 DIAGNOSIS — I2510 Atherosclerotic heart disease of native coronary artery without angina pectoris: Secondary | ICD-10-CM | POA: Diagnosis present

## 2017-02-07 DIAGNOSIS — G4730 Sleep apnea, unspecified: Secondary | ICD-10-CM | POA: Diagnosis present

## 2017-02-07 DIAGNOSIS — I5033 Acute on chronic diastolic (congestive) heart failure: Secondary | ICD-10-CM | POA: Diagnosis present

## 2017-02-07 DIAGNOSIS — Z9119 Patient's noncompliance with other medical treatment and regimen: Secondary | ICD-10-CM

## 2017-02-07 DIAGNOSIS — E785 Hyperlipidemia, unspecified: Secondary | ICD-10-CM | POA: Diagnosis present

## 2017-02-07 DIAGNOSIS — R0902 Hypoxemia: Secondary | ICD-10-CM | POA: Diagnosis present

## 2017-02-07 DIAGNOSIS — E039 Hypothyroidism, unspecified: Secondary | ICD-10-CM | POA: Diagnosis present

## 2017-02-07 DIAGNOSIS — F0390 Unspecified dementia without behavioral disturbance: Secondary | ICD-10-CM | POA: Diagnosis present

## 2017-02-07 DIAGNOSIS — J441 Chronic obstructive pulmonary disease with (acute) exacerbation: Secondary | ICD-10-CM | POA: Diagnosis present

## 2017-02-07 DIAGNOSIS — Z79899 Other long term (current) drug therapy: Secondary | ICD-10-CM

## 2017-02-07 DIAGNOSIS — Z888 Allergy status to other drugs, medicaments and biological substances status: Secondary | ICD-10-CM

## 2017-02-07 DIAGNOSIS — E871 Hypo-osmolality and hyponatremia: Secondary | ICD-10-CM | POA: Diagnosis not present

## 2017-02-07 DIAGNOSIS — I959 Hypotension, unspecified: Secondary | ICD-10-CM | POA: Diagnosis not present

## 2017-02-07 DIAGNOSIS — N183 Chronic kidney disease, stage 3 (moderate): Secondary | ICD-10-CM | POA: Diagnosis present

## 2017-02-07 DIAGNOSIS — E1122 Type 2 diabetes mellitus with diabetic chronic kidney disease: Secondary | ICD-10-CM | POA: Diagnosis present

## 2017-02-07 DIAGNOSIS — K219 Gastro-esophageal reflux disease without esophagitis: Secondary | ICD-10-CM | POA: Diagnosis present

## 2017-02-07 DIAGNOSIS — Z7984 Long term (current) use of oral hypoglycemic drugs: Secondary | ICD-10-CM

## 2017-02-07 DIAGNOSIS — Z880 Allergy status to penicillin: Secondary | ICD-10-CM

## 2017-02-07 LAB — CBC
HEMATOCRIT: 40.3 % (ref 40.0–52.0)
HEMOGLOBIN: 13.3 g/dL (ref 13.0–18.0)
MCH: 29.3 pg (ref 26.0–34.0)
MCHC: 33 g/dL (ref 32.0–36.0)
MCV: 88.9 fL (ref 80.0–100.0)
Platelets: 131 10*3/uL — ABNORMAL LOW (ref 150–440)
RBC: 4.53 MIL/uL (ref 4.40–5.90)
RDW: 14.2 % (ref 11.5–14.5)
WBC: 8.1 10*3/uL (ref 3.8–10.6)

## 2017-02-07 LAB — COMPREHENSIVE METABOLIC PANEL
ALBUMIN: 3.8 g/dL (ref 3.5–5.0)
ALK PHOS: 63 U/L (ref 38–126)
ALT: 25 U/L (ref 17–63)
AST: 28 U/L (ref 15–41)
Anion gap: 9 (ref 5–15)
BILIRUBIN TOTAL: 0.9 mg/dL (ref 0.3–1.2)
BUN: 22 mg/dL — AB (ref 6–20)
CALCIUM: 8.7 mg/dL — AB (ref 8.9–10.3)
CO2: 27 mmol/L (ref 22–32)
CREATININE: 1.33 mg/dL — AB (ref 0.61–1.24)
Chloride: 100 mmol/L — ABNORMAL LOW (ref 101–111)
GFR calc Af Amer: 57 mL/min — ABNORMAL LOW (ref >60)
GFR calc non Af Amer: 50 mL/min — ABNORMAL LOW (ref >60)
GLUCOSE: 148 mg/dL — AB (ref 65–99)
POTASSIUM: 4.2 mmol/L (ref 3.5–5.1)
Sodium: 136 mmol/L (ref 135–145)
TOTAL PROTEIN: 7.5 g/dL (ref 6.5–8.1)

## 2017-02-07 LAB — TROPONIN I: Troponin I: <0.03 ng/mL (ref <0.03)

## 2017-02-07 NOTE — ED Triage Notes (Addendum)
Pt in with co shob for few days, also has had cold symptoms. Has had cough and congestion, no chest pain. Pt has a hx of pneumonia, denies any fever. Pt speaks [place] but family is able to interpret.

## 2017-02-08 ENCOUNTER — Inpatient Hospital Stay
Admission: EM | Admit: 2017-02-08 | Discharge: 2017-02-11 | DRG: 308 | Disposition: A | Discharge status: Home / Self Care | Payer: Medicare Other | Attending: Internal Medicine | Admitting: Internal Medicine

## 2017-02-08 ENCOUNTER — Encounter: Payer: Self-pay | Admitting: *Deleted

## 2017-02-08 DIAGNOSIS — E871 Hypo-osmolality and hyponatremia: Secondary | ICD-10-CM | POA: Diagnosis not present

## 2017-02-08 DIAGNOSIS — I2510 Atherosclerotic heart disease of native coronary artery without angina pectoris: Secondary | ICD-10-CM | POA: Diagnosis present

## 2017-02-08 DIAGNOSIS — R0902 Hypoxemia: Secondary | ICD-10-CM

## 2017-02-08 DIAGNOSIS — E1122 Type 2 diabetes mellitus with diabetic chronic kidney disease: Secondary | ICD-10-CM | POA: Diagnosis present

## 2017-02-08 DIAGNOSIS — G4730 Sleep apnea, unspecified: Secondary | ICD-10-CM | POA: Diagnosis present

## 2017-02-08 DIAGNOSIS — Z79899 Other long term (current) drug therapy: Secondary | ICD-10-CM | POA: Diagnosis not present

## 2017-02-08 DIAGNOSIS — I5033 Acute on chronic diastolic (congestive) heart failure: Secondary | ICD-10-CM | POA: Diagnosis present

## 2017-02-08 DIAGNOSIS — I4891 Unspecified atrial fibrillation: Secondary | ICD-10-CM | POA: Diagnosis present

## 2017-02-08 DIAGNOSIS — Z888 Allergy status to other drugs, medicaments and biological substances status: Secondary | ICD-10-CM | POA: Diagnosis not present

## 2017-02-08 DIAGNOSIS — R059 Cough, unspecified: Secondary | ICD-10-CM

## 2017-02-08 DIAGNOSIS — F0390 Unspecified dementia without behavioral disturbance: Secondary | ICD-10-CM | POA: Diagnosis present

## 2017-02-08 DIAGNOSIS — I481 Persistent atrial fibrillation: Secondary | ICD-10-CM | POA: Diagnosis present

## 2017-02-08 DIAGNOSIS — I509 Heart failure, unspecified: Secondary | ICD-10-CM

## 2017-02-08 DIAGNOSIS — Z7984 Long term (current) use of oral hypoglycemic drugs: Secondary | ICD-10-CM | POA: Diagnosis not present

## 2017-02-08 DIAGNOSIS — I959 Hypotension, unspecified: Secondary | ICD-10-CM | POA: Diagnosis not present

## 2017-02-08 DIAGNOSIS — Z7982 Long term (current) use of aspirin: Secondary | ICD-10-CM | POA: Diagnosis not present

## 2017-02-08 DIAGNOSIS — J441 Chronic obstructive pulmonary disease with (acute) exacerbation: Secondary | ICD-10-CM | POA: Diagnosis present

## 2017-02-08 DIAGNOSIS — E039 Hypothyroidism, unspecified: Secondary | ICD-10-CM | POA: Diagnosis present

## 2017-02-08 DIAGNOSIS — Z9119 Patient's noncompliance with other medical treatment and regimen: Secondary | ICD-10-CM | POA: Diagnosis not present

## 2017-02-08 DIAGNOSIS — F17220 Nicotine dependence, chewing tobacco, uncomplicated: Secondary | ICD-10-CM | POA: Diagnosis present

## 2017-02-08 DIAGNOSIS — R05 Cough: Secondary | ICD-10-CM

## 2017-02-08 DIAGNOSIS — Z880 Allergy status to penicillin: Secondary | ICD-10-CM | POA: Diagnosis not present

## 2017-02-08 DIAGNOSIS — K219 Gastro-esophageal reflux disease without esophagitis: Secondary | ICD-10-CM | POA: Diagnosis present

## 2017-02-08 DIAGNOSIS — I130 Hypertensive heart and chronic kidney disease with heart failure and stage 1 through stage 4 chronic kidney disease, or unspecified chronic kidney disease: Secondary | ICD-10-CM | POA: Diagnosis present

## 2017-02-08 DIAGNOSIS — E785 Hyperlipidemia, unspecified: Secondary | ICD-10-CM | POA: Diagnosis present

## 2017-02-08 DIAGNOSIS — R0602 Shortness of breath: Secondary | ICD-10-CM | POA: Diagnosis present

## 2017-02-08 DIAGNOSIS — N183 Chronic kidney disease, stage 3 (moderate): Secondary | ICD-10-CM | POA: Diagnosis present

## 2017-02-08 DIAGNOSIS — I340 Nonrheumatic mitral (valve) insufficiency: Secondary | ICD-10-CM | POA: Diagnosis present

## 2017-02-08 LAB — GLUCOSE, CAPILLARY
Glucose-Capillary: 216 mg/dL — ABNORMAL HIGH (ref 65–99)
Glucose-Capillary: 339 mg/dL — ABNORMAL HIGH (ref 65–99)
Glucose-Capillary: 280 mg/dL — ABNORMAL HIGH (ref 65–99)
Glucose-Capillary: 116 mg/dL — ABNORMAL HIGH (ref 65–99)

## 2017-02-08 LAB — TSH: TSH: 0.867 u[IU]/mL (ref 0.350–4.500)

## 2017-02-08 LAB — HEMOGLOBIN A1C
Hgb A1c MFr Bld: 7.7 % — ABNORMAL HIGH (ref 4.8–5.6)
Mean Plasma Glucose: 174.29 mg/dL

## 2017-02-08 LAB — TROPONIN I

## 2017-02-08 MED ORDER — ENOXAPARIN SODIUM 40 MG/0.4ML ~~LOC~~ SOLN
40.0000 mg | SUBCUTANEOUS | Status: DC
  Administered 2017-02-08 – 2017-02-11 (×4): 40 mg via SUBCUTANEOUS
  Filled 2017-02-08 (×4): qty 0.4

## 2017-02-08 MED ORDER — INSULIN ASPART 100 UNIT/ML ~~LOC~~ SOLN
0.0000 [IU] | Freq: Three times a day (TID) | SUBCUTANEOUS | Status: DC
  Administered 2017-02-08: 5 [IU] via SUBCUTANEOUS
  Administered 2017-02-08: 7 [IU] via SUBCUTANEOUS
  Administered 2017-02-08: 3 [IU] via SUBCUTANEOUS
  Administered 2017-02-09 (×2): 2 [IU] via SUBCUTANEOUS
  Administered 2017-02-10: 1 [IU] via SUBCUTANEOUS
  Administered 2017-02-10 (×2): 2 [IU] via SUBCUTANEOUS
  Filled 2017-02-08 (×9): qty 1

## 2017-02-08 MED ORDER — IPRATROPIUM-ALBUTEROL 0.5-2.5 (3) MG/3ML IN SOLN
3.0000 mL | Freq: Four times a day (QID) | RESPIRATORY_TRACT | Status: DC | PRN
  Administered 2017-02-08: 3 mL via RESPIRATORY_TRACT
  Filled 2017-02-08: qty 3

## 2017-02-08 MED ORDER — LEVOTHYROXINE SODIUM 112 MCG PO TABS
112.0000 ug | ORAL_TABLET | Freq: Every day | ORAL | Status: DC
  Administered 2017-02-08 – 2017-02-11 (×4): 112 ug via ORAL
  Filled 2017-02-08 (×5): qty 1

## 2017-02-08 MED ORDER — DIGOXIN 250 MCG PO TABS
0.2500 mg | ORAL_TABLET | Freq: Every day | ORAL | Status: DC
  Filled 2017-02-08: qty 1

## 2017-02-08 MED ORDER — FUROSEMIDE 10 MG/ML IJ SOLN
20.0000 mg | Freq: Once | INTRAMUSCULAR | Status: AC
  Administered 2017-02-08: 20 mg via INTRAVENOUS
  Filled 2017-02-08: qty 4

## 2017-02-08 MED ORDER — DIGOXIN 0.25 MG/ML IJ SOLN
0.2500 mg | Freq: Every day | INTRAMUSCULAR | Status: DC
  Administered 2017-02-08 – 2017-02-10 (×3): 0.25 mg via INTRAVENOUS
  Filled 2017-02-08 (×3): qty 1

## 2017-02-08 MED ORDER — FUROSEMIDE 10 MG/ML IJ SOLN
20.0000 mg | Freq: Once | INTRAMUSCULAR | Status: AC
  Administered 2017-02-08: 20 mg via INTRAVENOUS
  Filled 2017-02-08: qty 2

## 2017-02-08 MED ORDER — METOPROLOL SUCCINATE ER 100 MG PO TB24: 100.0000 mg | ORAL_TABLET | Freq: Every day | ORAL | Status: DC

## 2017-02-08 MED ORDER — DULOXETINE HCL 30 MG PO CPEP
60.0000 mg | ORAL_CAPSULE | Freq: Every day | ORAL | Status: DC
  Administered 2017-02-08 – 2017-02-11 (×4): 60 mg via ORAL
  Filled 2017-02-08 (×4): qty 2

## 2017-02-08 MED ORDER — FUROSEMIDE 20 MG PO TABS
20.0000 mg | ORAL_TABLET | Freq: Two times a day (BID) | ORAL | Status: DC
  Administered 2017-02-08 – 2017-02-11 (×6): 20 mg via ORAL
  Filled 2017-02-08 (×6): qty 1

## 2017-02-08 MED ORDER — ACETAMINOPHEN 650 MG RE SUPP: 650.0000 mg | Freq: Four times a day (QID) | RECTAL | Status: DC | PRN

## 2017-02-08 MED ORDER — ASPIRIN EC 81 MG PO TBEC
81.0000 mg | DELAYED_RELEASE_TABLET | Freq: Every day | ORAL | Status: DC
  Administered 2017-02-08 – 2017-02-11 (×4): 81 mg via ORAL
  Filled 2017-02-08 (×4): qty 1

## 2017-02-08 MED ORDER — MELATONIN 5 MG PO TABS
2.5000 mg | ORAL_TABLET | Freq: Every evening | ORAL | Status: DC | PRN
  Administered 2017-02-08 – 2017-02-11 (×3): 5 mg via ORAL
  Filled 2017-02-08 (×4): qty 1

## 2017-02-08 MED ORDER — HYDRALAZINE HCL 50 MG PO TABS
50.0000 mg | ORAL_TABLET | Freq: Two times a day (BID) | ORAL | Status: DC
Start: 2017-02-08 — End: 2017-02-09
  Administered 2017-02-08 (×2): 50 mg via ORAL
  Filled 2017-02-08 (×2): qty 1

## 2017-02-08 MED ORDER — DOCUSATE SODIUM 100 MG PO CAPS
100.0000 mg | ORAL_CAPSULE | Freq: Two times a day (BID) | ORAL | Status: DC
  Administered 2017-02-08 – 2017-02-11 (×6): 100 mg via ORAL
  Filled 2017-02-08 (×7): qty 1

## 2017-02-08 MED ORDER — OMEGA-3-ACID ETHYL ESTERS 1 G PO CAPS
2.0000 g | ORAL_CAPSULE | Freq: Two times a day (BID) | ORAL | Status: DC
  Administered 2017-02-08 – 2017-02-11 (×7): 2 g via ORAL
  Filled 2017-02-08 (×7): qty 2

## 2017-02-08 MED ORDER — PANTOPRAZOLE SODIUM 40 MG PO TBEC
40.0000 mg | DELAYED_RELEASE_TABLET | Freq: Every day | ORAL | Status: DC
  Administered 2017-02-08 – 2017-02-11 (×4): 40 mg via ORAL
  Filled 2017-02-08 (×4): qty 1

## 2017-02-08 MED ORDER — FERROUS SULFATE 325 (65 FE) MG PO TABS
325.0000 mg | ORAL_TABLET | Freq: Every day | ORAL | Status: DC
  Administered 2017-02-08 – 2017-02-11 (×4): 325 mg via ORAL
  Filled 2017-02-08 (×4): qty 1

## 2017-02-08 MED ORDER — AMIODARONE IV BOLUS ONLY 150 MG/100ML
150.0000 mg | Freq: Once | INTRAVENOUS | Status: AC
  Administered 2017-02-08: 150 mg via INTRAVENOUS

## 2017-02-08 MED ORDER — ONDANSETRON HCL 4 MG/2ML IJ SOLN: 4.0000 mg | Freq: Four times a day (QID) | INTRAMUSCULAR | Status: DC | PRN

## 2017-02-08 MED ORDER — NITROGLYCERIN 2 % TD OINT
0.5000 [in_us] | TOPICAL_OINTMENT | TRANSDERMAL | Status: AC
Start: 2017-02-08 — End: 2017-02-08
  Administered 2017-02-08: 0.5 [in_us] via TOPICAL
  Filled 2017-02-08: qty 1

## 2017-02-08 MED ORDER — ONDANSETRON HCL 4 MG PO TABS: 4.0000 mg | ORAL_TABLET | Freq: Four times a day (QID) | ORAL | Status: DC | PRN

## 2017-02-08 MED ORDER — IPRATROPIUM-ALBUTEROL 0.5-2.5 (3) MG/3ML IN SOLN
3.0000 mL | Freq: Once | RESPIRATORY_TRACT | Status: AC
  Administered 2017-02-08: 3 mL via RESPIRATORY_TRACT
  Filled 2017-02-08: qty 3

## 2017-02-08 MED ORDER — AMIODARONE HCL IN DEXTROSE 360-4.14 MG/200ML-% IV SOLN
60.0000 mg/h | INTRAVENOUS | Status: DC
  Administered 2017-02-08: 60 mg/h via INTRAVENOUS
  Filled 2017-02-08: qty 200

## 2017-02-08 MED ORDER — BENAZEPRIL HCL 10 MG PO TABS
10.0000 mg | ORAL_TABLET | Freq: Every day | ORAL | Status: DC
Start: 2017-02-08 — End: 2017-02-11
  Administered 2017-02-08 – 2017-02-11 (×4): 10 mg via ORAL
  Filled 2017-02-08 (×4): qty 1

## 2017-02-08 MED ORDER — AMIODARONE IV BOLUS ONLY 150 MG/100ML
INTRAVENOUS | Status: AC
  Administered 2017-02-08: 150 mg via INTRAVENOUS
  Filled 2017-02-08: qty 100

## 2017-02-08 MED ORDER — METHYLPREDNISOLONE SODIUM SUCC 125 MG IJ SOLR
125.0000 mg | Freq: Once | INTRAMUSCULAR | Status: AC
  Administered 2017-02-08: 125 mg via INTRAVENOUS
  Filled 2017-02-08: qty 2

## 2017-02-08 MED ORDER — INSULIN ASPART 100 UNIT/ML ~~LOC~~ SOLN
0.0000 [IU] | Freq: Once | SUBCUTANEOUS | Status: DC
  Filled 2017-02-08: qty 1

## 2017-02-08 MED ORDER — METOPROLOL TARTRATE 50 MG PO TABS
50.0000 mg | ORAL_TABLET | Freq: Three times a day (TID) | ORAL | Status: DC
  Administered 2017-02-08 (×2): 50 mg via ORAL
  Filled 2017-02-08 (×2): qty 1

## 2017-02-08 MED ORDER — MOMETASONE FURO-FORMOTEROL FUM 200-5 MCG/ACT IN AERO
2.0000 | INHALATION_SPRAY | Freq: Two times a day (BID) | RESPIRATORY_TRACT | Status: DC
  Administered 2017-02-08 – 2017-02-11 (×6): 2 via RESPIRATORY_TRACT
  Filled 2017-02-08: qty 8.8

## 2017-02-08 MED ORDER — AMLODIPINE BESYLATE 10 MG PO TABS
10.0000 mg | ORAL_TABLET | Freq: Every day | ORAL | Status: DC
  Administered 2017-02-08: 10 mg via ORAL
  Filled 2017-02-08: qty 1

## 2017-02-08 MED ORDER — AMIODARONE HCL IN DEXTROSE 360-4.14 MG/200ML-% IV SOLN: 30.0000 mg/h | INTRAVENOUS | Status: DC

## 2017-02-08 MED ORDER — ACETAMINOPHEN 325 MG PO TABS
650.0000 mg | ORAL_TABLET | Freq: Four times a day (QID) | ORAL | Status: DC | PRN
  Administered 2017-02-09: 650 mg via ORAL
  Filled 2017-02-08: qty 2

## 2017-02-08 MED ORDER — METOPROLOL SUCCINATE ER 100 MG PO TB24
200.0000 mg | ORAL_TABLET | Freq: Every day | ORAL | Status: DC
  Filled 2017-02-08: qty 2

## 2017-02-08 MED ORDER — DONEPEZIL HCL 5 MG PO TABS
5.0000 mg | ORAL_TABLET | Freq: Every day | ORAL | Status: DC
  Administered 2017-02-08 – 2017-02-11 (×3): 5 mg via ORAL
  Filled 2017-02-08 (×4): qty 1

## 2017-02-08 NOTE — Consult Note (Signed)
Derek Shelton is a 77 y.o. male  [id number]  Primary Cardiologist: Dr. Neoma Laming Reason for Consultation: Atrial fibrillation with intermittent RVR, CHF   HPI: Derek Shelton is a 77yo male last seen in our office Monday 02/06/17 with a known history of atrial fibrillation, untreated sleep apnea, CHF, COPD who was having increased shortness of breath. He was noted to have intermittent RVR and desaturations into the 80s on room air with exertion.    Review of Systems: Has no chest pain, mild shortness of breath is improving.   Past Medical History:  Diagnosis Date  . Anemia   . Asthma   . CHF (congestive heart failure) ([place])   . COPD (chronic obstructive pulmonary disease) ([place])   . Coronary artery disease   . Dementia    Per son's report  . Diabetes mellitus without complication ([place])   . Dysrhythmia    Atrial Fibrillation  . GERD (gastroesophageal reflux disease)   . Hyperlipidemia   . Hypertension   . Hypothyroidism   . Shortness of breath dyspnea   . Sleep apnea   . Thyroid disease     Medications Prior to Admission  Medication Sig Dispense Refill  . amLODipine (NORVASC) 10 MG tablet Take 1 tablet (10 mg total) by mouth daily. 30 tablet 0  . aspirin EC 81 MG tablet Take 81 mg by mouth daily.    . benazepril (LOTENSIN) 10 MG tablet Take 10 mg by mouth daily.    . budesonide-formoterol (SYMBICORT) 160-4.5 MCG/ACT inhaler Inhale 2 puffs into the lungs 2 (two) times daily.    . digoxin (LANOXIN) 0.25 MG tablet Take 0.25 mg by mouth daily.    Marland Kitchen donepezil (ARICEPT) 5 MG tablet Take 5 mg by mouth at bedtime.    . DULoxetine (CYMBALTA) 60 MG capsule Take 60 mg by mouth daily.    Marland Kitchen esomeprazole (NEXIUM) 40 MG capsule Take 40 mg by mouth daily at 12 noon.    . ferrous sulfate 325 (65 FE) MG EC tablet Take 325 mg by mouth daily.    . furosemide (LASIX) 20 MG tablet Take 20 mg by mouth 2 (two) times daily.    Marland Kitchen glimepiride (AMARYL) 4 MG tablet Take 4 mg by mouth 2 (two)  times daily.     . hydrALAZINE (APRESOLINE) 50 MG tablet Take 50 mg by mouth 2 (two) times daily.    Marland Kitchen ipratropium-albuterol (DUONEB) 0.5-2.5 (3) MG/3ML SOLN Inhale 3 mLs into the lungs every 6 (six) hours as needed for shortness of breath.  3  . levothyroxine (SYNTHROID, LEVOTHROID) 112 MCG tablet Take 112 mcg by mouth daily before breakfast.     . Melatonin 3 MG TABS Take 3 mg by mouth at bedtime as needed.    . metoprolol succinate (TOPROL-XL) 100 MG 24 hr tablet Take 100 mg by mouth daily.     Marland Kitchen omega-3 acid ethyl esters (LOVAZA) 1 G capsule Take 2 g by mouth 2 (two) times daily.        Marland Kitchen amLODipine  10 mg Oral Daily  . aspirin EC  81 mg Oral Daily  . benazepril  10 mg Oral Daily  . digoxin  0.25 mg Oral Daily  . docusate sodium  100 mg Oral BID  . donepezil  5 mg Oral QHS  . DULoxetine  60 mg Oral Daily  . enoxaparin (LOVENOX) injection  40 mg Subcutaneous Q24H  . furosemide  20 mg Oral BID  . hydrALAZINE  50  mg Oral BID  . insulin aspart  0-5 Units Subcutaneous Once  . insulin aspart  0-9 Units Subcutaneous TID WC  . levothyroxine  112 mcg Oral QAC breakfast  . metoprolol succinate  100 mg Oral Daily  . omega-3 acid ethyl esters  2 g Oral BID  . pantoprazole  40 mg Oral Daily    Infusions: . amiodarone 60 mg/hr (02/08/17 0536)  . amiodarone      Allergies  Allergen Reactions  . Catapres [Clonidine Hcl]   . Coreg [Carvedilol]   . Penicillins     Social History   Socioeconomic History  . Marital status: Married    Spouse name: Not on file  . Number of children: Not on file  . Years of education: Not on file  . Highest education level: Not on file  Social Needs  . Financial resource strain: Not on file  . Food insecurity - worry: Not on file  . Food insecurity - inability: Not on file  . Transportation needs - medical: Not on file  . Transportation needs - non-medical: Not on file  Occupational History  . Not on file  Tobacco Use  . Smoking status: Former  Research scientist (life sciences)  . Smokeless tobacco: Current User    Types: Chew  Substance and Sexual Activity  . Alcohol use: No  . Drug use: No  . Sexual activity: Not on file  Other Topics Concern  . Not on file  Social History Narrative  . Not on file    Family History  Problem Relation Age of Onset  . Diabetes Mother     PHYSICAL EXAM: Vitals:   02/08/17 0830 02/08/17 0900  BP: 113/75 124/87  Pulse: (!) 115 (!) 126  Resp:    Temp:    SpO2:       Intake/Output Summary (Last 24 hours) at 02/08/2017 0942 Last data filed at 02/08/2017 0241 Gross per 24 hour  Intake 100 ml  Output -  Net 100 ml    General: Mild shortness of breath HEENT: normal Neck: supple. no JVD. Carotids 2+ bilat; no bruits. No lymphadenopathy or thryomegaly appreciated. Cor: PMI nondisplaced. Regular rate & rhythm. No rubs, gallops or murmurs. Lungs: clear Abdomen: Distended, firm, no tenderness. Extremities: no cyanosis, clubbing, rash, edema Neuro: alert & oriented x 3, cranial nerves grossly intact. moves all 4 extremities w/o difficulty. Affect pleasant.  ECG: Atrial fibrillation 90-110bpm  Results for orders placed or performed during the hospital encounter of 02/08/17 (from the past 24 hour(s))  CBC     Status: Abnormal   Collection Time: 02/07/17  8:06 PM  Result Value Ref Range   WBC 8.1 3.8 - 10.6 K/uL   RBC 4.53 4.40 - 5.90 MIL/uL   Hemoglobin 13.3 13.0 - 18.0 g/dL   HCT 40.3 40.0 - 52.0 %   MCV 88.9 80.0 - 100.0 fL   MCH 29.3 26.0 - 34.0 pg   MCHC 33.0 32.0 - 36.0 g/dL   RDW 14.2 11.5 - 14.5 %   Platelets 131 (L) 150 - 440 K/uL  Comprehensive metabolic panel     Status: Abnormal   Collection Time: 02/07/17  8:06 PM  Result Value Ref Range   Sodium 136 135 - 145 mmol/L   Potassium 4.2 3.5 - 5.1 mmol/L   Chloride 100 (L) 101 - 111 mmol/L   CO2 27 22 - 32 mmol/L   Glucose, Bld 148 (H) 65 - 99 mg/dL   BUN 22 (H) 6 - 20 mg/dL  Creatinine, Ser 1.33 (H) 0.61 - 1.24 mg/dL   Calcium 8.7 (L) 8.9 -  10.3 mg/dL   Total Protein 7.5 6.5 - 8.1 g/dL   Albumin 3.8 3.5 - 5.0 g/dL   AST 28 15 - 41 U/L   ALT 25 17 - 63 U/L   Alkaline Phosphatase 63 38 - 126 U/L   Total Bilirubin 0.9 0.3 - 1.2 mg/dL   GFR calc non Af Amer 50 (L) >60 mL/min   GFR calc Af Amer 57 (L) >60 mL/min   Anion gap 9 5 - 15  Troponin I     Status: None   Collection Time: 02/07/17  8:06 PM  Result Value Ref Range   Troponin I <0.03 <0.03 ng/mL  Troponin I     Status: None   Collection Time: 02/08/17  1:44 AM  Result Value Ref Range   Troponin I <0.03 <0.03 ng/mL  TSH     Status: None   Collection Time: 02/08/17  6:50 AM  Result Value Ref Range   TSH 0.867 0.350 - 4.500 uIU/mL  Hemoglobin A1c     Status: Abnormal   Collection Time: 02/08/17  6:50 AM  Result Value Ref Range   Hgb A1c MFr Bld 7.7 (H) 4.8 - 5.6 %   Mean Plasma Glucose 174.29 mg/dL  Glucose, capillary     Status: Abnormal   Collection Time: 02/08/17  7:09 AM  Result Value Ref Range   Glucose-Capillary 216 (H) 65 - 99 mg/dL   Dg Chest 2 View  Result Date: 02/07/2017 CLINICAL DATA:  Cough EXAM: CHEST  2 VIEW COMPARISON:  08/06/2014 chest radiograph. FINDINGS: Stable cardiomediastinal silhouette with mild cardiomegaly and aortic atherosclerosis. No pneumothorax. Small right pleural effusion. No left pleural effusion. Borderline mild pulmonary edema. Mild right basilar atelectasis. IMPRESSION: 1. Borderline mild congestive heart failure. 2. Small right pleural effusion with mild right basilar atelectasis. Electronically Signed   By: Ilona Sorrel M.D.   On: 02/07/2017 20:28     ASSESSMENT AND PLAN: Persistent atrial fibrillation due to untreated sleep apnea. Pt refuses CPAP. Advise rate control only. DC amiodarone drip. Increase metoprolol to50mg  three times daily, monitor for tolerance as had allergic reaction to coreg in the past but has tolerated metoprolol. Advise IV digoxin dosing. CHF: Abdomen is distended. Continue diuresis with IV lasix and  monitor for improved shortness of breath. Monitor for readiness to discharge tomorrow.   Jake Bathe, NP-C Cell: [phone]

## 2017-02-08 NOTE — ED Notes (Signed)
Patient ambulated approx 20 feet from room 1 to nurse's station. Patient was able to ambulate independently, but O2 sats dropped to 87% on RA while ambulating from 96-97% on RA while at rest. Patient returned to bed without incident. Dr. Beather Arbour notified.

## 2017-02-08 NOTE — ED Notes (Signed)
Patient placed on 2L O2 per MD request

## 2017-02-08 NOTE — Progress Notes (Signed)
[facility] [place] NAME: Derek Shelton    MR#:  [id number]  DATE OF BIRTH:  11/15/1940  SUBJECTIVE:  CHIEF COMPLAINT: Patient is out of bed to chair.  Denies any chest pain or shortness of breath.  Denies any palpitations.  Wife at bedside.  REVIEW OF SYSTEMS:  CONSTITUTIONAL: No fever, fatigue or weakness.  EYES: No blurred or double vision.  EARS, NOSE, AND THROAT: No tinnitus or ear pain.  RESPIRATORY: No cough, shortness of breath, wheezing or hemoptysis.  CARDIOVASCULAR: No chest pain, orthopnea, edema.  GASTROINTESTINAL: No nausea, vomiting, diarrhea or abdominal pain.  GENITOURINARY: No dysuria, hematuria.  ENDOCRINE: No polyuria, nocturia,  HEMATOLOGY: No anemia, easy bruising or bleeding SKIN: No rash or lesion. MUSCULOSKELETAL: No joint pain or arthritis.   NEUROLOGIC: No tingling, numbness, weakness.  PSYCHIATRY: No anxiety or depression.   DRUG ALLERGIES:   Allergies  Allergen Reactions  . Catapres [Clonidine Hcl]   . Coreg [Carvedilol]   . Penicillins     VITALS:  Blood pressure 117/72, pulse 85, temperature 98 F (36.7 C), temperature source Oral, resp. rate 20, height 5\' 5"  (1.651 m), weight 96.4 kg (212 lb 9.6 oz), SpO2 99 %.  PHYSICAL EXAMINATION:  GENERAL:  77 y.o.-year-old patient lying in the bed with no acute distress.  EYES: Pupils equal, round, reactive to light and accommodation. No scleral icterus. Extraocular muscles intact.  HEENT: Head atraumatic, normocephalic. Oropharynx and nasopharynx clear.  NECK:  Supple, no jugular venous distention. No thyroid enlargement, no tenderness.  LUNGS: Normal breath sounds bilaterally, no wheezing, rales,rhonchi or crepitation. No use of accessory muscles of respiration.  CARDIOVASCULAR: Irregularly regular no murmurs, rubs, or gallops.  ABDOMEN: Soft, nontender, nondistended. Bowel sounds present. No organomegaly or mass.  EXTREMITIES: No pedal edema,  cyanosis, or clubbing.  NEUROLOGIC: Cranial nerves II through XII are intact. Muscle strength 5/5 in all extremities. Sensation intact. Gait not checked.  PSYCHIATRIC: The patient is alert and oriented x 3.  SKIN: No obvious rash, lesion, or ulcer.    LABORATORY PANEL:   CBC Recent Labs  Lab 02/07/17 2006  WBC 8.1  HGB 13.3  HCT 40.3  PLT 131*   ------------------------------------------------------------------------------------------------------------------  Chemistries  Recent Labs  Lab 02/07/17 2006  NA 136  K 4.2  CL 100*  CO2 27  GLUCOSE 148*  BUN 22*  CREATININE 1.33*  CALCIUM 8.7*  AST 28  ALT 25  ALKPHOS 63  BILITOT 0.9   ------------------------------------------------------------------------------------------------------------------  Cardiac Enzymes Recent Labs  Lab 02/08/17 0144  TROPONINI <0.03   ------------------------------------------------------------------------------------------------------------------  RADIOLOGY:  Dg Chest 2 View  Result Date: 02/07/2017 CLINICAL DATA:  Cough EXAM: CHEST  2 VIEW COMPARISON:  08/06/2014 chest radiograph. FINDINGS: Stable cardiomediastinal silhouette with mild cardiomegaly and aortic atherosclerosis. No pneumothorax. Small right pleural effusion. No left pleural effusion. Borderline mild pulmonary edema. Mild right basilar atelectasis. IMPRESSION: 1. Borderline mild congestive heart failure. 2. Small right pleural effusion with mild right basilar atelectasis. Electronically Signed   By: Ilona Sorrel M.D.   On: 02/07/2017 20:28    EKG:   Orders placed or performed during the hospital encounter of 02/08/17  . EKG 12-Lead  . EKG 12-Lead  . EKG 12-Lead  . EKG 12-Lead  . ED EKG  . ED EKG  . EKG 12-Lead  . EKG 12-Lead    ASSESSMENT AND PLAN:    This is a 77 year old male admitted for atrial fibrillation with rapid ventricular rate.  1.  A. fib: With intermittent rapid ventricular rate.   Clinically  improved with amiodarone drip.  Discontinued amiodarone drip Toprol 50 mg p.o. daily 3 times  Continue to monitor telemetry.   Patient is on aspirin only for anticoagulation  cardiology Dr. Humphrey Rolls is following  2.  CHF: Acute on chronic; diastolic.  Continue IV Lasix and digoxin.  3.  Hypertension: Uncontrolled; exacerbating CHF.  Apply Nitropaste.  Continue amlodipine, benazepril, hydralazine and metoprolol.  4.  Hypothyroidism: Normal TSH; continue Synthroid  5.  Diabetes mellitus type 2: Globin A1c 7.7  sliding scale insulin while hospitalized.   Hold oral hypoglycemic agents.      DVT prophylaxis: Lovenox   GI prophylaxis: Pantoprazole per home regimen       All the records are reviewed and case discussed with Care Management/Social Workerr. Management plans discussed with the patient, wife at bedside and son over phone and they are in agreement.  CODE STATUS: fc   TOTAL TIME TAKING CARE OF THIS PATIENT: 36 minutes.   POSSIBLE D/C IN  1- DAYS, DEPENDING ON CLINICAL CONDITION.  Note: This dictation was prepared with Dragon dictation along with smaller phrase technology. Any transcriptional errors that result from this process are unintentional.   Nicholes Mango M.D on 02/08/2017 at 3:46 PM  Between 7am to 6pm - Pager - [phone] After 6pm go to [email or web address] - password [email or web address] [facility]  Office  [phone]  CC: Primary care physician; Perrin Maltese, MD

## 2017-02-08 NOTE — ED Provider Notes (Signed)
[facility] Emergency Department Provider Note   ____________________________________________   First MD Initiated Contact with Patient 02/08/17 0121     (approximate)  I have reviewed the triage vital signs and the nursing notes.   HISTORY  Chief Complaint Shortness of Breath  History obtained per patient via family members who interpret Ordu  HPI Derek Shelton is a 77 y.o. male who presents to the ED from home with a chief complaint of shortness of breath.  Patient has a history of COPD, CHF on Lasix, atrial fibrillation on amiodarone, who has been feeling short of breath for the past 5 days.  Has had cold-like symptoms including nasal congestion and nonproductive cough.  Symptoms worsened with exertion.  Denies fever, chills, chest pain, abdominal pain, nausea, vomiting.  Denies recent travel or trauma.   Past Medical History:  Diagnosis Date  . Anemia   . Asthma   . CHF (congestive heart failure) ([place])   . COPD (chronic obstructive pulmonary disease) ([place])   . Coronary artery disease   . Dementia    Per son's report  . Diabetes mellitus without complication ([place])   . Dysrhythmia    Atrial Fibrillation  . GERD (gastroesophageal reflux disease)   . Hyperlipidemia   . Hypertension   . Hypothyroidism   . Shortness of breath dyspnea   . Sleep apnea   . Thyroid disease     Patient Active Problem List   Diagnosis Date Noted  . GI bleeding 08/06/2014  . Anemia 08/06/2014  . Bradycardia 08/06/2014  . Atrial fibrillation ([place]) 08/06/2014  . Hyponatremia 08/06/2014  . Chronic diastolic heart failure ([place]) 08/06/2014  . Diabetes ([place]) 08/06/2014  . OSA (obstructive sleep apnea) 08/06/2014    Past Surgical History:  Procedure Laterality Date  . CARDIAC CATHETERIZATION    . COLONOSCOPY N/A 08/10/2014   Procedure: COLONOSCOPY;  Surgeon: Manya Silvas, MD;  Location: [facility] ENDOSCOPY;  Service: Endoscopy;  Laterality: N/A;  . COLONOSCOPY  WITH PROPOFOL N/A 04/05/2016   Procedure: COLONOSCOPY WITH PROPOFOL;  Surgeon: Lollie Sails, MD;  Location: [facility] ENDOSCOPY;  Service: Endoscopy;  Laterality: N/A;  . ESOPHAGOGASTRODUODENOSCOPY N/A 08/08/2014   Procedure: ESOPHAGOGASTRODUODENOSCOPY (EGD);  Surgeon: Manya Silvas, MD;  Location: [facility] ENDOSCOPY;  Service: Endoscopy;  Laterality: N/A;  plan for early afternoon  . EYE SURGERY    . GIVENS CAPSULE STUDY N/A 08/12/2014   Procedure: GIVENS CAPSULE STUDY;  Surgeon: Manya Silvas, MD;  Location: [facility] ENDOSCOPY;  Service: Endoscopy;  Laterality: N/A;  . rectal fistula N/A     Prior to Admission medications   Medication Sig Start Date End Date Taking? Authorizing Provider  Albuterol Sulfate 108 (90 BASE) MCG/ACT AEPB Inhale into the lungs.    [provider]  amiodarone (PACERONE) 200 MG tablet Take 200 mg by mouth daily.    [provider]  amLODipine (NORVASC) 10 MG tablet Take 1 tablet (10 mg total) by mouth daily. 08/12/14   Dustin Flock, MD  aspirin EC 81 MG tablet Take 81 mg by mouth daily.    [provider]  benazepril (LOTENSIN) 10 MG tablet Take 10 mg by mouth daily.    [provider]  budesonide-formoterol (SYMBICORT) 160-4.5 MCG/ACT inhaler Inhale 1 puff into the lungs 2 (two) times daily.    [provider]  donepezil (ARICEPT) 5 MG tablet Take 5 mg by mouth at bedtime.    [provider]  DULoxetine (CYMBALTA) 60 MG capsule Take 60  mg by mouth daily.    [provider]  dutasteride (AVODART) 0.5 MG capsule Take 0.5 mg by mouth daily.    [provider]  ergocalciferol (VITAMIN D2) 50000 units capsule Take 50,000 Units by mouth once a week.    [provider]  esomeprazole (NEXIUM) 40 MG capsule Take 40 mg by mouth daily at 12 noon.    [provider]  ferrous sulfate 325 (65 FE) MG tablet Take 1 tablet (325 mg total) by mouth 3 (three) times daily with meals. 08/12/14   Dustin Flock, MD  finasteride (PROSCAR) 5 MG tablet Take 5 mg by mouth daily.    [provider]  furosemide (LASIX) 20 MG tablet Take 20 mg by mouth 2 (two) times daily.    [provider]  glimepiride (AMARYL) 2 MG tablet Take 2 mg by mouth daily with breakfast.    [provider]  hydrALAZINE (APRESOLINE) 100 MG tablet Take 100 mg by mouth 3 (three) times daily.    [provider]  levothyroxine (SYNTHROID, LEVOTHROID) 50 MCG tablet Take 50 mcg by mouth daily before breakfast.    [provider]  levothyroxine (SYNTHROID, LEVOTHROID) 75 MCG tablet Take 75 mcg by mouth daily before breakfast.    [provider]  Melatonin 3 MG TABS Take by mouth.    [provider]  metFORMIN (GLUCOPHAGE) 1000 MG tablet Take 1,000 mg by mouth 2 (two) times daily with a meal.    [provider]  metoprolol succinate (TOPROL-XL) 25 MG 24 hr tablet Take 25 mg by mouth daily.    [provider]  omega-3 acid ethyl esters (LOVAZA) 1 G capsule Take by mouth 2 (two) times daily.    [provider]  tiotropium (SPIRIVA) 18 MCG inhalation capsule Place 18 mcg into inhaler and inhale daily.    [provider]  traZODone (DESYREL) 100 MG tablet Take 100 mg by mouth at bedtime.    [provider]    Allergies Catapres [clonidine hcl]; Coreg [carvedilol]; and Penicillins  Family History  Problem Relation Age of Onset  . Diabetes Mother     Social History Social History   Tobacco Use  . Smoking status: Former Research scientist (life sciences)  . Smokeless tobacco: Current User    Types: Chew  Substance Use Topics  . Alcohol use: No  . Drug use: No    Review of Systems  Constitutional: No fever/chills. Eyes: No visual changes. ENT: No sore throat. Cardiovascular: Denies chest pain. Respiratory: Positive for shortness of breath. Gastrointestinal: No abdominal pain.  No nausea, no vomiting.  No diarrhea.  No  constipation. Genitourinary: Negative for dysuria. Musculoskeletal: Negative for back pain. Skin: Negative for rash. Neurological: Negative for headaches, focal weakness or numbness.   ____________________________________________   PHYSICAL EXAM:  VITAL SIGNS: ED Triage Vitals  Enc Vitals Group     BP 02/07/17 2004 (!) 140/91     Pulse Rate 02/07/17 2007 91     Resp 02/07/17 2004 20     Temp 02/07/17 2004 98 F (36.7 C)     Temp Source 02/07/17 2004 Oral     SpO2 02/07/17 2004 99 %     Weight 02/07/17 2005 210 lb (95.3 kg)     Height --      Head Circumference --      Peak Flow --      Pain Score --      Pain Loc --  Pain Edu? --      Excl. in [place]? --     Constitutional: Alert and oriented. Well appearing and in no acute distress. Eyes: Conjunctivae are normal. PERRL. EOMI. Head: Atraumatic. Nose: No congestion/rhinnorhea. Mouth/Throat: Mucous membranes are moist.  Oropharynx non-erythematous. Neck: No stridor.  No carotid bruits. Cardiovascular: Normal rate, irregular rhythm. Grossly normal heart sounds.  Good peripheral circulation. Respiratory: Normal respiratory effort.  No retractions. Lungs with faint bibasilar rales and wheezing. Gastrointestinal: Soft and nontender. No distention. No abdominal bruits. No CVA tenderness. Musculoskeletal: No lower extremity tenderness nor edema.  No joint effusions. Neurologic:  Normal speech and language. No gross focal neurologic deficits are appreciated.  Skin:  Skin is warm, dry and intact. No rash noted. Psychiatric: Mood and affect are normal. Speech and behavior are normal.  ____________________________________________   LABS (all labs ordered are listed, but only abnormal results are displayed)  Labs Reviewed  CBC - Abnormal; Notable for the following components:      Result Value   Platelets 131 (*)    All other components within normal limits  COMPREHENSIVE METABOLIC PANEL - Abnormal; Notable for the  following components:   Chloride 100 (*)    Glucose, Bld 148 (*)    BUN 22 (*)    Creatinine, Ser 1.33 (*)    Calcium 8.7 (*)    GFR calc non Af Amer 50 (*)    GFR calc Af Amer 57 (*)    All other components within normal limits  TROPONIN I  TROPONIN I   ____________________________________________  EKG  ED ECG REPORT I, Keighan Amezcua J, the attending physician, personally viewed and interpreted this ECG.   Date: 02/08/2017  EKG Time: 2007  Rate: 91  Rhythm: atrial fibrillation, rate 91  Axis: Normal  Intervals:right bundle branch block  ST&T Change: Nonspecific  ____________________________________________  RADIOLOGY  Dg Chest 2 View  Result Date: 02/07/2017 CLINICAL DATA:  Cough EXAM: CHEST  2 VIEW COMPARISON:  08/06/2014 chest radiograph. FINDINGS: Stable cardiomediastinal silhouette with mild cardiomegaly and aortic atherosclerosis. No pneumothorax. Small right pleural effusion. No left pleural effusion. Borderline mild pulmonary edema. Mild right basilar atelectasis. IMPRESSION: 1. Borderline mild congestive heart failure. 2. Small right pleural effusion with mild right basilar atelectasis. Electronically Signed   By: Ilona Sorrel M.D.   On: 02/07/2017 20:28    ____________________________________________   PROCEDURES  Procedure(s) performed: None  Procedures  Critical Care performed: No  ____________________________________________   INITIAL IMPRESSION / ASSESSMENT AND PLAN / ED COURSE  As part of my medical decision making, I reviewed the following data within the [place] History obtained from family, Nursing notes reviewed and incorporated, Labs reviewed, EKG interpreted, Old chart reviewed, Radiograph reviewed and Notes from prior ED visits.   77 year old male with a history of COPD and CHF who presents with recent cold-like symptoms and shortness of breath. Differential includes, but is not limited to, viral syndrome, bronchitis  including COPD exacerbation, pneumonia, reactive airway disease including asthma, CHF including exacerbation with or without pulmonary/interstitial edema, pneumothorax, ACS, thoracic trauma, and pulmonary embolism.  Initial EKG and troponin unremarkable.  Baseline renal function compared to prior.  X-ray suggestive of mild CHF.  Will administer Solu-Medrol, DuoNeb, IV Lasix and reassess.  Clinical Course as of Feb 09 319  Wed Feb 08, 2017  0217 Heart rate bouncing between 90s and 120s. Patient takes amiodarone orally; will administer 150 mg bolus.  Considered Lopressor; however, although patient takes metoprolol at home,  he has angioedema with Coreg.  Will avoid beta-blockers altogether.  [JS]  0321 Patient ambulated a short distance around his room; room air saturations decreased to 87% and he became tachypneic.  Will discuss with hospitalist to evaluate patient in the emergency department for admission.  [JS]    Clinical Course User Index [JS] Paulette Blanch, MD     ____________________________________________   FINAL CLINICAL IMPRESSION(S) / ED DIAGNOSES  Final diagnoses:  Shortness of breath  COPD exacerbation ([place])  Acute on chronic congestive heart failure, unspecified heart failure type [facility])  Hypoxia     ED Discharge Orders    None       Note:  This document was prepared using Dragon voice recognition software and may include unintentional dictation errors.    Paulette Blanch, MD 02/08/17 [phone]

## 2017-02-08 NOTE — H&P (Signed)
Derek Shelton is an 77 y.o. male.   Chief Complaint: Shortness of breath  HPI: The patient with past medical history of CHF, COPD, coronary artery disease, diabetes, hypertension and hypothyroidism presents to the emergency department complaining of shortness of breath.  The patient has had some dyspnea with exertion for the last 2 days.  He denies chest pain.  The patient was given Lasix IV in the emergency department to which he seemed to respond well but upon ambulation the patient had desaturations to the 80s on room air.  Also the patient became tachycardic.  He was given a bolus of amiodarone but still had heart rates jumping from the 80s up to low 130s which prompted the emergency department staff to call the hospitalist service for further management.  Past Medical History:  Diagnosis Date  . Anemia   . Asthma   . CHF (congestive heart failure) ([place])   . COPD (chronic obstructive pulmonary disease) ([place])   . Coronary artery disease   . Dementia    Per son's report  . Diabetes mellitus without complication ([place])   . Dysrhythmia    Atrial Fibrillation  . GERD (gastroesophageal reflux disease)   . Hyperlipidemia   . Hypertension   . Hypothyroidism   . Shortness of breath dyspnea   . Sleep apnea   . Thyroid disease     Past Surgical History:  Procedure Laterality Date  . CARDIAC CATHETERIZATION    . COLONOSCOPY N/A 08/10/2014   Procedure: COLONOSCOPY;  Surgeon: Manya Silvas, MD;  Location: [facility] ENDOSCOPY;  Service: Endoscopy;  Laterality: N/A;  . COLONOSCOPY WITH PROPOFOL N/A 04/05/2016   Procedure: COLONOSCOPY WITH PROPOFOL;  Surgeon: Lollie Sails, MD;  Location: [facility] ENDOSCOPY;  Service: Endoscopy;  Laterality: N/A;  . ESOPHAGOGASTRODUODENOSCOPY N/A 08/08/2014   Procedure: ESOPHAGOGASTRODUODENOSCOPY (EGD);  Surgeon: Manya Silvas, MD;  Location: [facility] ENDOSCOPY;  Service: Endoscopy;  Laterality: N/A;  plan for early afternoon  . EYE SURGERY    . GIVENS CAPSULE  STUDY N/A 08/12/2014   Procedure: GIVENS CAPSULE STUDY;  Surgeon: Manya Silvas, MD;  Location: [facility] ENDOSCOPY;  Service: Endoscopy;  Laterality: N/A;  . rectal fistula N/A     Family History  Problem Relation Age of Onset  . Diabetes Mother    Social History:  reports that he has quit smoking. His smokeless tobacco use includes chew. He reports that he does not drink alcohol or use drugs.  Allergies:  Allergies  Allergen Reactions  . Catapres [Clonidine Hcl]   . Coreg [Carvedilol]   . Penicillins      (Not in a hospital admission)  Results for orders placed or performed during the hospital encounter of 02/08/17 (from the past 48 hour(s))  CBC     Status: Abnormal   Collection Time: 02/07/17  8:06 PM  Result Value Ref Range   WBC 8.1 3.8 - 10.6 K/uL   RBC 4.53 4.40 - 5.90 MIL/uL   Hemoglobin 13.3 13.0 - 18.0 g/dL   HCT 40.3 40.0 - 52.0 %   MCV 88.9 80.0 - 100.0 fL   MCH 29.3 26.0 - 34.0 pg   MCHC 33.0 32.0 - 36.0 g/dL   RDW 14.2 11.5 - 14.5 %   Platelets 131 (L) 150 - 440 K/uL    Comment: Performed at [facility], [address], [place], [place] [address]  Comprehensive metabolic panel     Status: Abnormal   Collection Time: 02/07/17  8:06 PM  Result Value  Ref Range   Sodium 136 135 - 145 mmol/L   Potassium 4.2 3.5 - 5.1 mmol/L   Chloride 100 (L) 101 - 111 mmol/L   CO2 27 22 - 32 mmol/L   Glucose, Bld 148 (H) 65 - 99 mg/dL   BUN 22 (H) 6 - 20 mg/dL   Creatinine, Ser 1.33 (H) 0.61 - 1.24 mg/dL   Calcium 8.7 (L) 8.9 - 10.3 mg/dL   Total Protein 7.5 6.5 - 8.1 g/dL   Albumin 3.8 3.5 - 5.0 g/dL   AST 28 15 - 41 U/L   ALT 25 17 - 63 U/L   Alkaline Phosphatase 63 38 - 126 U/L   Total Bilirubin 0.9 0.3 - 1.2 mg/dL   GFR calc non Af Amer 50 (L) >60 mL/min   GFR calc Af Amer 57 (L) >60 mL/min    Comment: (NOTE) The eGFR has been calculated using the CKD EPI equation. This calculation has not been validated in all clinical situations. eGFR's persistently  <60 mL/min signify possible Chronic Kidney Disease.    Anion gap 9 5 - 15    Comment: Performed at [facility], [address] [place], [place] [address]  Troponin I     Status: None   Collection Time: 02/07/17  8:06 PM  Result Value Ref Range   Troponin I <0.03 <0.03 ng/mL    Comment: Performed at [facility], [address] [place], [place] [address]  Troponin I     Status: None   Collection Time: 02/08/17  1:44 AM  Result Value Ref Range   Troponin I <0.03 <0.03 ng/mL    Comment: Performed at [facility], [address], [place], [place] [address]   Dg Chest 2 View  Result Date: 02/07/2017 CLINICAL DATA:  Cough EXAM: CHEST  2 VIEW COMPARISON:  08/06/2014 chest radiograph. FINDINGS: Stable cardiomediastinal silhouette with mild cardiomegaly and aortic atherosclerosis. No pneumothorax. Small right pleural effusion. No left pleural effusion. Borderline mild pulmonary edema. Mild right basilar atelectasis. IMPRESSION: 1. Borderline mild congestive heart failure. 2. Small right pleural effusion with mild right basilar atelectasis. Electronically Signed   By: Ilona Sorrel M.D.   On: 02/07/2017 20:28    Review of Systems  Constitutional: Negative for chills and fever.  HENT: Negative for sore throat and tinnitus.   Eyes: Negative for blurred vision and redness.  Respiratory: Positive for shortness of breath. Negative for cough.   Cardiovascular: Negative for chest pain, palpitations, orthopnea and PND.  Gastrointestinal: Negative for abdominal pain, diarrhea, nausea and vomiting.  Genitourinary: Negative for dysuria, frequency and urgency.  Musculoskeletal: Negative for joint pain and myalgias.  Skin: Negative for rash.       No lesions  Neurological: Negative for speech change, focal weakness and weakness.  Endo/Heme/Allergies: Does not bruise/bleed easily.       No temperature intolerance  Psychiatric/Behavioral: Negative for depression and suicidal  ideas.    Blood pressure (!) 151/101, pulse (!) 101, temperature 98 F (36.7 C), temperature source Oral, resp. rate 19, weight 95.3 kg (210 lb), SpO2 97 %. Physical Exam  Vitals reviewed. Constitutional: He is oriented to person, place, and time. He appears well-developed and well-nourished. No distress.  HENT:  Head: Normocephalic and atraumatic.  Mouth/Throat: Oropharynx is clear and moist.  Eyes: Conjunctivae and EOM are normal. Pupils are equal, round, and reactive to light.  Neck: Normal range of motion. Neck supple. No JVD present. No tracheal deviation present. No thyromegaly present.  Cardiovascular: Normal rate and normal heart sounds. An irregularly irregular rhythm present. Exam reveals no gallop and no friction rub.  No murmur heard. Respiratory: Effort normal and breath sounds normal. No respiratory distress.  GI: Soft. Bowel sounds are normal. He exhibits no distension. There is no tenderness.  Genitourinary:  Genitourinary Comments: Deferred  Musculoskeletal: Normal range of motion. He exhibits no edema.  Lymphadenopathy:    He has no cervical adenopathy.  Neurological: He is alert and oriented to person, place, and time. No cranial nerve deficit.  Skin: Skin is warm and dry. No rash noted. No erythema.  Psychiatric: He has a normal mood and affect. His behavior is normal. Judgment and thought content normal.     Assessment/Plan This is a 77 year old male admitted for atrial fibrillation with rapid ventricular rate. 1.  A. fib: With intermittent rapid ventricular rate.  I placed the patient on amiodarone drip.  Heart rate is beginning to slow.  Continue to monitor telemetry.  Patient is on aspirin only for anticoagulation 2.  CHF: Acute on chronic; diastolic.  Continue Lasix and digoxin. 3.  Hypertension: Uncontrolled; exacerbating CHF.  Apply Nitropaste.  Continue amlodipine, benazepril, hydralazine and metoprolol. 4.  Hypothyroidism: Check TSH; continue Synthroid 5.   Diabetes mellitus type 2: Sliding scale insulin while hospitalized.  Hold oral hypoglycemic agents.  6.  DVT prophylaxis: Lovenox 7.  GI prophylaxis: Pantoprazole per home regimen The patient is a full code.  Time spent on admission orders and critical care approximately 45 minutes   Harrie Foreman, MD 02/08/2017, 4:17 AM

## 2017-02-08 NOTE — Plan of Care (Signed)
  Progressing Education: Knowledge of disease or condition will improve 02/08/2017 2053 - Progressing by Jeri Cos, RN Understanding of medication regimen will improve 02/08/2017 2053 - Progressing by Jeri Cos, RN Activity: Ability to tolerate increased activity will improve 02/08/2017 2053 - Progressing by Jeri Cos, RN Cardiac: Ability to achieve and maintain adequate cardiopulmonary perfusion will improve 02/08/2017 2053 - Progressing by Jeri Cos, RN

## 2017-02-08 NOTE — Care Management Note (Signed)
Case Management Note  Patient Details  Name: TRONG GOSLING MRN: [id number] Date of Birth: [birth year]  Subjective/Objective:                 Admitted from home with acute on chronic heart failure and atrial fib with RVR.  Requiring amiodarone drip.  This CM had set patient up with home cpap approximately 3 years ago but patient may not be using.   Action/Plan:  Looking for interpretor  Expected Discharge Date:                  Expected Discharge Plan:     In-House Referral:     Discharge planning Services     Post Acute Care Choice:    Choice offered to:     DME Arranged:    DME Agency:     HH Arranged:    HH Agency:     Status of Service:     If discussed at [employer] of Stay Meetings, dates discussed:    Additional Comments:  Katrina Stack, RN 02/08/2017, 8:57 AM

## 2017-02-09 ENCOUNTER — Inpatient Hospital Stay: Payer: Medicare Other

## 2017-02-09 LAB — GLUCOSE, CAPILLARY
Glucose-Capillary: 114 mg/dL — ABNORMAL HIGH (ref 65–99)
Glucose-Capillary: 178 mg/dL — ABNORMAL HIGH (ref 65–99)
Glucose-Capillary: 153 mg/dL — ABNORMAL HIGH (ref 65–99)
Glucose-Capillary: 155 mg/dL — ABNORMAL HIGH (ref 65–99)

## 2017-02-09 LAB — CBC
HEMATOCRIT: 36.7 % — AB (ref 40.0–52.0)
HEMOGLOBIN: 12.1 g/dL — AB (ref 13.0–18.0)
MCH: 29.2 pg (ref 26.0–34.0)
MCHC: 33 g/dL (ref 32.0–36.0)
MCV: 88.4 fL (ref 80.0–100.0)
Platelets: 139 10*3/uL — ABNORMAL LOW (ref 150–440)
RBC: 4.16 MIL/uL — AB (ref 4.40–5.90)
RDW: 14.2 % (ref 11.5–14.5)
WBC: 11.9 10*3/uL — ABNORMAL HIGH (ref 3.8–10.6)

## 2017-02-09 LAB — BASIC METABOLIC PANEL
Anion gap: 10 (ref 5–15)
BUN: 33 mg/dL — ABNORMAL HIGH (ref 6–20)
CHLORIDE: 95 mmol/L — AB (ref 101–111)
CO2: 22 mmol/L (ref 22–32)
CREATININE: 1.44 mg/dL — AB (ref 0.61–1.24)
Calcium: 8.2 mg/dL — ABNORMAL LOW (ref 8.9–10.3)
GFR calc non Af Amer: 45 mL/min — ABNORMAL LOW (ref >60)
GFR, EST AFRICAN AMERICAN: 52 mL/min — AB (ref >60)
Glucose, Bld: 187 mg/dL — ABNORMAL HIGH (ref 65–99)
POTASSIUM: 3.8 mmol/L (ref 3.5–5.1)
Sodium: 127 mmol/L — ABNORMAL LOW (ref 135–145)

## 2017-02-09 MED ORDER — GUAIFENESIN 100 MG/5ML PO SOLN
10.0000 mL | Freq: Four times a day (QID) | ORAL | Status: DC | PRN
  Administered 2017-02-09 – 2017-02-10 (×3): 200 mg via ORAL
  Filled 2017-02-09 (×5): qty 10

## 2017-02-09 MED ORDER — METOPROLOL TARTRATE 50 MG PO TABS: 100.0000 mg | ORAL_TABLET | Freq: Two times a day (BID) | ORAL | Status: DC

## 2017-02-09 MED ORDER — BUDESONIDE 0.5 MG/2ML IN SUSP
0.5000 mg | Freq: Two times a day (BID) | RESPIRATORY_TRACT | Status: DC
  Administered 2017-02-09 – 2017-02-11 (×5): 0.5 mg via RESPIRATORY_TRACT
  Filled 2017-02-09 (×5): qty 2

## 2017-02-09 MED ORDER — METOPROLOL TARTRATE 50 MG PO TABS
100.0000 mg | ORAL_TABLET | Freq: Once | ORAL | Status: AC
  Administered 2017-02-09: 100 mg via ORAL
  Filled 2017-02-09: qty 2

## 2017-02-09 MED ORDER — METOPROLOL TARTRATE 50 MG PO TABS
50.0000 mg | ORAL_TABLET | Freq: Three times a day (TID) | ORAL | Status: DC
  Administered 2017-02-09 – 2017-02-11 (×5): 50 mg via ORAL
  Filled 2017-02-09 (×5): qty 1

## 2017-02-09 MED ORDER — IPRATROPIUM-ALBUTEROL 0.5-2.5 (3) MG/3ML IN SOLN
3.0000 mL | Freq: Four times a day (QID) | RESPIRATORY_TRACT | Status: DC
  Administered 2017-02-09 – 2017-02-11 (×7): 3 mL via RESPIRATORY_TRACT
  Filled 2017-02-09 (×7): qty 3

## 2017-02-09 NOTE — Care Management (Signed)
Spoke with patient's son who speaks english.  Patient did not use his cpap that he qualified for 3 years ago and it was returned.  Discussed the re qualifying process that if wishes to discharge with a cpap, would have to pay out of pocket for the equipment until he met all the criteria.  Have asked that patient have home 02 assessment performed and there is an order for over night oximetry present to be performed tonight.  Discussed with family if it is felt that he will need cpap, [facility] can provide at discharge but patient would have to pay 700 dollars and the sleep study will have to be scheduled.  Family thinks "patient will use it this time."

## 2017-02-09 NOTE — Progress Notes (Signed)
SUBJECTIVE: Patient is feeling much better   Vitals:   02/08/17 1614 02/08/17 1931 02/09/17 0328 02/09/17 0736  BP: 132/84 137/71 108/89 98/72  Pulse: (!) 118 (!) 115 (!) 143 (!) 107  Resp: 17 18 17 20   Temp:  98.4 F (36.9 C) 98.3 F (36.8 C) 97.6 F (36.4 C)  TempSrc:  Oral  Oral  SpO2: 97% 98% 97% 98%  Weight:   218 lb 9.6 oz (99.2 kg)   Height:        Intake/Output Summary (Last 24 hours) at 02/09/2017 0841 Last data filed at 02/09/2017 0500 Gross per 24 hour  Intake 240 ml  Output 1000 ml  Net -760 ml    LABS: Basic Metabolic Panel: Recent Labs    02/07/17 2006  NA 136  K 4.2  CL 100*  CO2 27  GLUCOSE 148*  BUN 22*  CREATININE 1.33*  CALCIUM 8.7*   Liver Function Tests: Recent Labs    02/07/17 2006  AST 28  ALT 25  ALKPHOS 63  BILITOT 0.9  PROT 7.5  ALBUMIN 3.8   No results for input(s): LIPASE, AMYLASE in the last 72 hours. CBC: Recent Labs    02/07/17 2006  WBC 8.1  HGB 13.3  HCT 40.3  MCV 88.9  PLT 131*   Cardiac Enzymes: Recent Labs    02/07/17 2006 02/08/17 0144  TROPONINI <0.03 <0.03   BNP: Invalid input(s): POCBNP D-Dimer: No results for input(s): DDIMER in the last 72 hours. Hemoglobin A1C: Recent Labs    02/08/17 0650  HGBA1C 7.7*   Fasting Lipid Panel: No results for input(s): CHOL, HDL, LDLCALC, TRIG, CHOLHDL, LDLDIRECT in the last 72 hours. Thyroid Function Tests: Recent Labs    02/08/17 0650  TSH 0.867   Anemia Panel: No results for input(s): VITAMINB12, FOLATE, FERRITIN, TIBC, IRON, RETICCTPCT in the last 72 hours.   PHYSICAL EXAM General: Well developed, well nourished, in no acute distress HEENT:  Normocephalic and atramatic Neck:  No JVD.  Lungs: Clear bilaterally to auscultation and percussion. Heart: HRRR . Normal S1 and S2 without gallops or murmurs.  Abdomen: Bowel sounds are positive, abdomen soft and non-tender  Msk:  Back normal, normal gait. Normal strength and tone for age. Extremities: No  clubbing, cyanosis or edema.   Neuro: Alert and oriented X 3. Psych:  Good affect, responds appropriately  TELEMETRY: A. fib with rate between 90 and 112  ASSESSMENT AND PLAN: Atrial fibrillation with rapid ventricular response rate secondary to severe sleep apnea but patient is noncompliant with CPAP and has not worn it for a year now. Patient initially was converted to sinus rhythm but went back into atrial fibrillation due to noncompliance with CPAP. Advise rate control with metoprolol increasing 200 twice a day and continue digoxin 0.25 once a day. Advise adding some Lasix maybe 40 mg once a day and may be oxygen should be given as an outpatient. Evaluation for oxygen should be done.  Active Problems:   SOB (shortness of breath)   Atrial fibrillation with RVR (H[facility])    Neoma Laming A, MD, [facility] 02/09/2017 8:41 AM

## 2017-02-09 NOTE — Progress Notes (Signed)
Inpatient Diabetes Program Recommendations  AACE/ADA: New Consensus Statement on Inpatient Glycemic Control (2015)  Target Ranges:  Prepandial:   less than 140 mg/dL      Peak postprandial:   less than 180 mg/dL (1-2 hours)      Critically ill patients:  140 - 180 mg/dL   Lab Results  Component Value Date   GLUCAP 178 (H) 02/09/2017   HGBA1C 7.7 (H) 02/08/2017    Review of Glycemic Control  Results for Derek, Shelton (MRN [id number]) as of 02/09/2017 11:52  Ref. Range 02/08/2017 11:07 02/08/2017 16:14 02/08/2017 20:32 02/09/2017 07:38 02/09/2017 11:35  Glucose-Capillary Latest Ref Range: 65 - 99 mg/dL 339 (H) 280 (H) 116 (H) 114 (H) 178 (H)    Diabetes history: Type 2 Outpatient Diabetes medications: Amaryl 4mg  bid Current orders for Inpatient glycemic control: Novolog 0-9 units tid, Novolog 0-5 units qhs  Inpatient Diabetes Program Recommendations:  Agree with current medications for blood sugar management.    Gentry Fitz, RN, BA, MHA, CDE Diabetes Coordinator Inpatient Diabetes Program  [phone] (Team Pager) [phone] ([place]) 02/09/2017 11:54 AM

## 2017-02-09 NOTE — Progress Notes (Signed)
Patient ID: Derek Shelton, male   DOB: Mar 04, 1940, 77 y.o.   MRN: [id number]  [employer] PROGRESS NOTE  Derek Shelton:[id number] DOB: 1940/12/11 DOA: 02/08/2017 PCP: Perrin Maltese, MD  HPI/Subjective: Patient with cough overnight.  Patient also had fast heart rate.  Patient has a history of dementia and also have a language barrier.  Patient's son was asking questions to the patient.  Objective: Vitals:   02/09/17 1331 02/09/17 1611  BP:  114/76  Pulse:  95  Resp:  18  Temp:  98.5 F (36.9 C)  SpO2: 96% 98%    Filed Weights   02/07/17 2005 02/08/17 0627 02/09/17 0328  Weight: 95.3 kg (210 lb) 96.4 kg (212 lb 9.6 oz) 99.2 kg (218 lb 9.6 oz)    ROS: Review of Systems  Unable to perform ROS: Dementia  Respiratory: Positive for cough and shortness of breath.   Cardiovascular: Negative for chest pain.  Gastrointestinal: Negative for abdominal pain.  Musculoskeletal: Negative for joint pain.   Exam: Physical Exam  HENT:  Nose: No mucosal edema.  Mouth/Throat: No oropharyngeal exudate or posterior oropharyngeal edema.  Eyes: Conjunctivae, EOM and lids are normal. Pupils are equal, round, and reactive to light.  Neck: No JVD present. Carotid bruit is not present. No edema present. No thyroid mass and no thyromegaly present.  Cardiovascular: S1 normal, S2 normal and normal heart sounds. An irregularly irregular rhythm present. Tachycardia present. Exam reveals no gallop.  No murmur heard. Pulses:      Dorsalis pedis pulses are 2+ on the right side, and 2+ on the left side.  Respiratory: No respiratory distress. He has decreased breath sounds in the right lower field and the left lower field. He has no wheezes. He has no rhonchi. He has rales in the right lower field and the left lower field.  GI: Soft. Bowel sounds are normal. There is no tenderness.  Musculoskeletal:       Right shoulder: He exhibits no swelling.  Lymphadenopathy:    He has no cervical adenopathy.   Neurological: He is alert. No cranial nerve deficit.  Skin: Skin is warm. No rash noted. Nails show no clubbing.  Psychiatric: He has a normal mood and affect.      Data Reviewed: Basic Metabolic Panel: Recent Labs  Lab 02/07/17 2006 02/09/17 0955  NA 136 127*  K 4.2 3.8  CL 100* 95*  CO2 27 22  GLUCOSE 148* 187*  BUN 22* 33*  CREATININE 1.33* 1.44*  CALCIUM 8.7* 8.2*   Liver Function Tests: Recent Labs  Lab 02/07/17 2006  AST 28  ALT 25  ALKPHOS 63  BILITOT 0.9  PROT 7.5  ALBUMIN 3.8   CBC: Recent Labs  Lab 02/07/17 2006 02/09/17 0955  WBC 8.1 11.9*  HGB 13.3 12.1*  HCT 40.3 36.7*  MCV 88.9 88.4  PLT 131* 139*   Cardiac Enzymes: Recent Labs  Lab 02/07/17 2006 02/08/17 0144  TROPONINI <0.03 <0.03    CBG: Recent Labs  Lab 02/08/17 1614 02/08/17 2032 02/09/17 0738 02/09/17 1135 02/09/17 1720  GLUCAP 280* 116* 114* 178* 153*      Studies: Dg Chest 2 View  Result Date: 02/09/2017 CLINICAL DATA:  Shortness of breath. EXAM: CHEST  2 VIEW COMPARISON:  02/07/2017. FINDINGS: Cardiomegaly with pulmonary venous congestion and bilateral interstitial prominence. Small right pleural effusion. No pneumothorax. IMPRESSION: Cardiomegaly with pulmonary venous congestion and bilateral interstitial prominence. Small right pleural effusion. Findings suggest mild CHF. Electronically Signed  By: [place]   On: 02/09/2017 10:31   Dg Chest 2 View  Result Date: 02/07/2017 CLINICAL DATA:  Cough EXAM: CHEST  2 VIEW COMPARISON:  08/06/2014 chest radiograph. FINDINGS: Stable cardiomediastinal silhouette with mild cardiomegaly and aortic atherosclerosis. No pneumothorax. Small right pleural effusion. No left pleural effusion. Borderline mild pulmonary edema. Mild right basilar atelectasis. IMPRESSION: 1. Borderline mild congestive heart failure. 2. Small right pleural effusion with mild right basilar atelectasis. Electronically Signed   By: Ilona Sorrel M.D.   On:  02/07/2017 20:28    Scheduled Meds: . aspirin EC  81 mg Oral Daily  . benazepril  10 mg Oral Daily  . budesonide (PULMICORT) nebulizer solution  0.5 mg Nebulization BID  . digoxin  0.25 mg Intravenous Daily  . docusate sodium  100 mg Oral BID  . donepezil  5 mg Oral QHS  . DULoxetine  60 mg Oral Daily  . enoxaparin (LOVENOX) injection  40 mg Subcutaneous Q24H  . ferrous sulfate  325 mg Oral Q lunch  . furosemide  20 mg Oral BID  . insulin aspart  0-5 Units Subcutaneous Once  . insulin aspart  0-9 Units Subcutaneous TID WC  . ipratropium-albuterol  3 mL Inhalation Q6H  . levothyroxine  112 mcg Oral QAC breakfast  . metoprolol tartrate  50 mg Oral TID  . mometasone-formoterol  2 puff Inhalation BID  . omega-3 acid ethyl esters  2 g Oral BID  . pantoprazole  40 mg Oral Daily   Continuous Infusions:  Assessment/Plan:  1. Atrial fibrillation with rapid ventricular response.  Metoprolol was increased to 100 mg twice daily but then changed back to 50 mg 3 times daily.  Patient also on digoxin. 2. Relative hypotension.  Norvasc and hydralazine stopped. 3. Acute diastolic congestive heart failure on oral Lasix.  Chest x-ray shows showing some fluid. 4. Hypothyroidism unspecified on levothyroxine 5. Overnight oximetry 6. Dementia on Aricept 7. Hyponatremia.  Check another sodium tomorrow.  Code Status:     Code Status Orders  (From admission, onward)        Start     Ordered   02/08/17 0615  Full code  Continuous     02/08/17 0615    Code Status History    Date Active Date Inactive Code Status Order ID Comments User Context   08/06/2014 18:21 08/12/2014 20:19 Full Code [id number]  Aldean Jewett, MD Inpatient     Family Communication: Spoke with wife and son Disposition Plan: Home in the next day or so  Consultants:  Cardiology  Time spent: 28 minutes  [facility]

## 2017-02-09 NOTE — Progress Notes (Signed)
Per cardiology [place], changing patients metoprolol back to 50mg  TID.

## 2017-02-09 NOTE — Progress Notes (Addendum)
CCMD called to report 2.45 sec SVR. Per CCMD this is a pause for afib. Went in to check on patient. Patient in bed with no complaints. Paged MD to notify. No new orders received. Will continue to monitor patient.

## 2017-02-09 NOTE — Progress Notes (Signed)
Rounding with MD and patient. Per MD go ahead and give the 100mg  metoprolol tartrate now. Will continue to monitor patient.

## 2017-02-10 LAB — BASIC METABOLIC PANEL
Anion gap: 8 (ref 5–15)
BUN: 31 mg/dL — AB (ref 6–20)
CO2: 26 mmol/L (ref 22–32)
CREATININE: 1.4 mg/dL — AB (ref 0.61–1.24)
Calcium: 8.3 mg/dL — ABNORMAL LOW (ref 8.9–10.3)
Chloride: 102 mmol/L (ref 101–111)
GFR, EST AFRICAN AMERICAN: 54 mL/min — AB (ref >60)
GFR, EST NON AFRICAN AMERICAN: 47 mL/min — AB (ref >60)
Glucose, Bld: 153 mg/dL — ABNORMAL HIGH (ref 65–99)
POTASSIUM: 3.9 mmol/L (ref 3.5–5.1)
SODIUM: 136 mmol/L (ref 135–145)

## 2017-02-10 LAB — GLUCOSE, CAPILLARY
Glucose-Capillary: 133 mg/dL — ABNORMAL HIGH (ref 65–99)
Glucose-Capillary: 153 mg/dL — ABNORMAL HIGH (ref 65–99)
Glucose-Capillary: 163 mg/dL — ABNORMAL HIGH (ref 65–99)
Glucose-Capillary: 189 mg/dL — ABNORMAL HIGH (ref 65–99)
Glucose-Capillary: 206 mg/dL — ABNORMAL HIGH (ref 65–99)
Glucose-Capillary: 233 mg/dL — ABNORMAL HIGH (ref 65–99)

## 2017-02-10 LAB — DIGOXIN LEVEL: DIGOXIN LVL: 1.4 ng/mL (ref 0.8–2.0)

## 2017-02-10 MED ORDER — INSULIN ASPART 100 UNIT/ML ~~LOC~~ SOLN: 0.0000 [IU] | Freq: Every day | SUBCUTANEOUS | Status: DC

## 2017-02-10 MED ORDER — DIGOXIN 0.25 MG/ML IJ SOLN
0.1250 mg | Freq: Every day | INTRAMUSCULAR | Status: DC
  Administered 2017-02-11: 0.125 mg via INTRAVENOUS
  Filled 2017-02-10: qty 0.5

## 2017-02-10 MED ORDER — INSULIN ASPART 100 UNIT/ML ~~LOC~~ SOLN
0.0000 [IU] | Freq: Three times a day (TID) | SUBCUTANEOUS | Status: DC
  Administered 2017-02-11 (×2): 1 [IU] via SUBCUTANEOUS

## 2017-02-10 NOTE — Progress Notes (Signed)
Had another 2 second pause. Change digoxin 0.125 daily and get digoxin level. Had digoxin 0.25 this morning already though. Creat was normal in office and now 1.4 and have to cut back on digoxin.

## 2017-02-10 NOTE — Plan of Care (Signed)
  Progressing Clinical Measurements: Diagnostic test results will improve 02/10/2017 0339 - Progressing by Jeri Cos, RN Respiratory complications will improve 02/10/2017 0339 - Progressing by Jeri Cos, RN Activity: Risk for activity intolerance will decrease 02/10/2017 0339 - Progressing by Jeri Cos, RN

## 2017-02-10 NOTE — Progress Notes (Signed)
CCMD notified this RN that pt had 2 second pause. Cardio MD notified. Order to change digoxin to 0.125mg . Digoxin level was ordered as well. Will continue to monitor.

## 2017-02-10 NOTE — Plan of Care (Signed)
Nutrition Education Note  RD consulted for nutrition education regarding new onset CHF.  RD provided "Low Sodium Nutrition Therapy" handout from the Academy of [employer] Dietetics. Reviewed patient's dietary recall. Provided examples on ways to decrease sodium intake in diet. Discouraged intake of processed foods and use of salt shaker. Encouraged fresh fruits and vegetables as well as whole grain sources of carbohydrates to maximize fiber intake.   RD discussed why it is important for patient to adhere to diet recommendations, and emphasized the role of fluids, foods to avoid, and importance of weighing self daily. Teach back method used.  Expect fair compliance.  Body mass index is 35.73 kg/m. Pt meets criteria for obese class II based on current BMI.  Current diet order is heart healthy/carb modified, patient is consuming approximately 100% of meals at this time. Labs and medications reviewed. No further nutrition interventions warranted at this time. RD contact information provided. If additional nutrition issues arise, please re-consult RD.   Derek Shelton. Derek [place], MS, RD LDN Inpatient Clinical Dietitian Pager [phone]

## 2017-02-10 NOTE — Progress Notes (Signed)
SUBJECTIVE: Patient is feeling much better   Vitals:   02/09/17 2015 02/09/17 2056 02/10/17 0408 02/10/17 0832  BP: 122/77  109/71 (!) 141/88  Pulse: 72  (!) 25 (!) 123  Resp: (!) 22  16 16   Temp: 97.6 F (36.4 C)  (!) 97.4 F (36.3 C) 97.8 F (36.6 C)  TempSrc: Oral  Oral Oral  SpO2: 95% 95% 96% 99%  Weight:   214 lb 11.2 oz (97.4 kg)   Height:        Intake/Output Summary (Last 24 hours) at 02/10/2017 0841 Last data filed at 02/10/2017 0411 Gross per 24 hour  Intake 480 ml  Output 2300 ml  Net -1820 ml    LABS: Basic Metabolic Panel: Recent Labs    02/09/17 0955 02/10/17 0420  NA 127* 136  K 3.8 3.9  CL 95* 102  CO2 22 26  GLUCOSE 187* 153*  BUN 33* 31*  CREATININE 1.44* 1.40*  CALCIUM 8.2* 8.3*   Liver Function Tests: Recent Labs    02/07/17 2006  AST 28  ALT 25  ALKPHOS 63  BILITOT 0.9  PROT 7.5  ALBUMIN 3.8   No results for input(s): LIPASE, AMYLASE in the last 72 hours. CBC: Recent Labs    02/07/17 2006 02/09/17 0955  WBC 8.1 11.9*  HGB 13.3 12.1*  HCT 40.3 36.7*  MCV 88.9 88.4  PLT 131* 139*   Cardiac Enzymes: Recent Labs    02/07/17 2006 02/08/17 0144  TROPONINI <0.03 <0.03   BNP: Invalid input(s): POCBNP D-Dimer: No results for input(s): DDIMER in the last 72 hours. Hemoglobin A1C: Recent Labs    02/08/17 0650  HGBA1C 7.7*   Fasting Lipid Panel: No results for input(s): CHOL, HDL, LDLCALC, TRIG, CHOLHDL, LDLDIRECT in the last 72 hours. Thyroid Function Tests: Recent Labs    02/08/17 0650  TSH 0.867   Anemia Panel: No results for input(s): VITAMINB12, FOLATE, FERRITIN, TIBC, IRON, RETICCTPCT in the last 72 hours.   PHYSICAL EXAM General: Well developed, well nourished, in no acute distress HEENT:  Normocephalic and atramatic Neck:  No JVD.  Lungs: Clear bilaterally to auscultation and percussion. Heart: HRRR . Normal S1 and S2 without gallops or murmurs.  Abdomen: Bowel sounds are positive, abdomen soft and  non-tender  Msk:  Back normal, normal gait. Normal strength and tone for age. Extremities: No clubbing, cyanosis or edema.   Neuro: Alert and oriented X 3. Psych:  Good affect, responds appropriately  TELEMETRY: Atrial fibrillation with rate ranging from 100  ASSESSMENT AND PLAN: Atrial fibrillation with rapid ventricular response rate responded very nicely with metoprolol 50 3 times a day and digoxin 0.25 once a day. Patient had a positive yesterday of 2 second and metoprolol dosage was decreased from 200 daily to 50 tid. Creatinine is going up and digoxin level should be monitored by her to discharge and metoprolol can be titrated up and digoxin can be decreased to 0.125 digoxin level was elevated. Apparently saturation is improving.  Active Problems:   SOB (shortness of breath)   Atrial fibrillation with RVR (HCC)    Neoma Laming A, MD, [facility] 02/10/2017 8:41 AM

## 2017-02-10 NOTE — Progress Notes (Addendum)
Per Dr. Marshia Ly note:  Patient's active problems this admission include: Atrial fib with RVR, relative hypotension, acute diastolic CHF on oral lasix - CXR showing some fluid-hypothyroidism, dementia on Aricept, hyponatremia, and sleep apnea - patient non-compliant with wearing CPAP prior to admission.     Patient lying in bed dozing.  Son at bedside.  Patient speaks Urdu.  This language is not available on the language line nor is there an interpreter available who speaks this language.    CHF Education:  Educational session completed with son.  Patient lives with son.   ? Provided son with "Living Better with Heart Failure" packet. Briefly reviewed definition of heart failure and signs and symptoms of an exacerbation.   *Reviewed importance of and reason behind checking weight daily in the AM, after using the bathroom, but before getting dressed. Patient has scales.   Reviewed the following information with patient:  *Discussed when to call the Dr= weight gain of >2-3lb overnight of 5lb in a week,  *Discussed yellow zone= call MD: weight gain of >2-3lb overnight of 5lb in a week, increased swelling, increased SOB when lying down, chest discomfort, dizziness, increased fatigue *Red Zone= call 911: struggle to breath, fainting or near fainting, significant chest pain  ? ?*Reviewed low sodium diet-provided handout of recommended and not recommended foods. Reviewed reading labels with son. Had a long discussion about other seasonings instead of salt.  Patient's son informed this RN that the patient uses all of a salt.  Noted peanuts and snacks in the room.  Son openly admitted patient does not limit his salt intake.  Son very interested in hearing about different seasonings - sodium free seasoning, lemon juice, lime juice, vinegar, herbs, etc.   Discussed fluid intake with patient's son as well. Patient not currently on a fluid restriction, but advised no more than 8-8 ounces glass of fluids per  day. Dietitian Consult for diet education entered.   *Instructed patient/son to take medications as prescribed for heart failure. Explained briefly why pt is on the medications (either make you feel better, live longer or keep you out of the hospital) and discussed monitoring and side effects.  ? *Discussed exercise. Son indicates that patient moves from bedroom to different rooms in the home, but does not exercise.  Encouraged activity.    *Smoking Cessation - patient is a former smoker.   ? Role of [facility] discussed. Son requests no appointment at this time due to patient leaving the country at the end of January and not returning until mid-March.  Per son's request this RN cancelled the new patient appointment that had already been scheduled for this patient in the [facility].       Roanna Epley, RN, BSN, [facility]  Cardiovascular & Pulmonary Nurse Navigator  Direct Line: [phone]  Department Phone #: [phone] Fax: [phone]  Email Address: [email or web address]

## 2017-02-10 NOTE — Evaluation (Signed)
Physical Therapy Evaluation Patient Details Name: Derek Shelton MRN: [id number] DOB: [birth year] Today's Date: 02/10/2017   History of Present Illness  The patient with past medical history of CHF, COPD, coronary artery disease, diabetes, hypertension and hypothyroidism presents to the emergency department complaining of shortness of breath.      Clinical Impression  Pt presents to PT at baseline functional mobility and strength and is limited with ambulation distance due to SOB and poor endurance due to sedentary lifestyle and cardiopulmonary problems.  Pt reports he spends most of his time in doors at home, walking only short household distances as needed.  Pt currently with untreated sleep apnea which medical team is addressing this admission.  Pt's poor energy levels may be due in part to effects of sleep apnea.  Pt would benefit from trial of HHPT to address endurance deficits in conjunction with treatment of breathing problems.    Follow Up Recommendations Home health PT    Equipment Recommendations  None recommended by PT(pt has a cane)    Recommendations for Other Services       Precautions / Restrictions Precautions Precautions: Fall Precaution Comments: High Restrictions Weight Bearing Restrictions: No      Mobility  Bed Mobility Overal bed mobility: Independent      General bed mobility comments: Pt already up, son present when pt got out of bed and reports no difficulty  Transfers Overall transfer level: Independent Equipment used: Straight cane     Ambulation/Gait Ambulation/Gait assistance: Modified independent (Device/Increase time) Ambulation Distance (Feet): 300 Feet Assistive device: Straight cane Gait Pattern/deviations: WFL(Within Functional Limits)     General Gait Details: Pt with steady step through gait pattern using SPC; assist for directions around unit; pt had audible wheezing starting around 250' with O2 sats stable at 97% on room air;  wheezing subsided after about 3 minutes seated rest.  Stairs       Wheelchair Mobility    Modified Rankin (Stroke Patients Only)       Balance Overall balance assessment: Modified Independent           Pertinent Vitals/Pain Pain Assessment: No/denies pain    Home Living Family/patient expects to be discharged to:: Private residence Living Arrangements: Spouse/significant other;Children Available Help at Discharge: Family;Available PRN/intermittently Type of Home: House Home Access: Stairs to enter   [employer] of Steps: 4 Home Layout: One level Home Equipment: Cane - single point      Prior Function Level of Independence: Independent with assistive device(s)     Comments: Pt amb with SPC for limited household distance from bedroom to couch and to bathroom; son reports pt has rolled out of bed during the night, but has not fallen while awake.     Hand Dominance        Extremity/Trunk Assessment   Upper Extremity Assessment Upper Extremity Assessment: Overall WFL for tasks assessed    Lower Extremity Assessment Lower Extremity Assessment: Overall WFL for tasks assessed    Cervical / Trunk Assessment Cervical / Trunk Assessment: Normal  Communication   Communication: Interpreter utilized(Speaks Urdu, son present for translation assist)  Cognition Arousal/Alertness: Awake/alert Behavior During Therapy: WFL for tasks assessed/performed Overall Cognitive Status: History of cognitive impairments - at baseline(dementia per son)        General Comments: Resistant to ambulation but finally agreed with persuasion from son and therapist.      General Comments General comments (skin integrity, edema, etc.): skin clean, dry and intact visible areas  Exercises     Assessment/Plan    PT Assessment Patent does not need any further PT services;All further PT needs can be met in the next venue of care  PT Problem List Cardiopulmonary status  limiting activity       PT Treatment Interventions      PT Goals (Current goals can be found in the Care Plan section)  Acute Rehab PT Goals Patient Stated Goal: To go home. PT Goal Formulation: With patient Time For Goal Achievement: 02/24/17 Potential to Achieve Goals: Good    Frequency     Barriers to discharge        Co-evaluation      AM-PAC PT "6 Clicks" Daily Activity  Outcome Measure Difficulty turning over in bed (including adjusting bedclothes, sheets and blankets)?: None Difficulty moving from lying on back to sitting on the side of the bed? : None Difficulty sitting down on and standing up from a chair with arms (e.g., wheelchair, bedside commode, etc,.)?: None Help needed moving to and from a bed to chair (including a wheelchair)?: None Help needed walking in hospital room?: None Help needed climbing 3-5 steps with a railing? : None 6 Click Score: 24    End of Session   Activity Tolerance: Patient tolerated treatment well Patient left: in chair;with call bell/phone within reach;with family/visitor present Nurse Communication: Mobility status PT Visit Diagnosis: Difficulty in walking, not elsewhere classified (R26.2)    Time: 1310-1336 PT Time Calculation (min) (ACUTE ONLY): 26 min   Charges:   PT Evaluation $PT Eval Low Complexity: 1 Low PT Treatments $Therapeutic Activity: 8-22 mins   PT G Codes:        Derek Shelton A Derek Shelton, PT 02/10/2017, 2:02 PM

## 2017-02-10 NOTE — Care Management (Signed)
Per overnight pulse ox. Patient was 88 or below for 35 min.  Patient will qualify for noctural O2,  Jermaine with [facility] was sent a message to notify for this weekend.

## 2017-02-10 NOTE — Progress Notes (Signed)
Patient ID: Derek Shelton, male   DOB: 18-Mar-1940, 77 y.o.   MRN: [id number]   [facility] PROGRESS NOTE  Derek Shelton DOB: 02-20-1940 DOA: 02/08/2017 PCP: Perrin Maltese, MD  HPI/Subjective: Patient feels okay.  Offers no complaints.  States his breathing is better.  Still having some cough.  Objective: Vitals:   02/10/17 0832 02/10/17 1352  BP: (!) 141/88   Pulse: 80   Resp: 16   Temp: 97.8 F (36.6 C)   SpO2: 99% 97%    Filed Weights   02/08/17 0627 02/09/17 0328 02/10/17 0408  Weight: 96.4 kg (212 lb 9.6 oz) 99.2 kg (218 lb 9.6 oz) 97.4 kg (214 lb 11.2 oz)    ROS: Review of Systems  Unable to perform ROS: Dementia  Respiratory: Positive for cough. Negative for shortness of breath.   Cardiovascular: Negative for chest pain.  Gastrointestinal: Negative for abdominal pain.  Musculoskeletal: Negative for joint pain.   Exam: Physical Exam  HENT:  Nose: No mucosal edema.  Mouth/Throat: No oropharyngeal exudate or posterior oropharyngeal edema.  Eyes: Conjunctivae, EOM and lids are normal. Pupils are equal, round, and reactive to light.  Neck: No JVD present. Carotid bruit is not present. No edema present. No thyroid mass and no thyromegaly present.  Cardiovascular: S1 normal, S2 normal and normal heart sounds. An irregularly irregular rhythm present. Exam reveals no gallop.  No murmur heard. Pulses:      Dorsalis pedis pulses are 2+ on the right side, and 2+ on the left side.  Respiratory: No respiratory distress. He has decreased breath sounds in the right lower field and the left lower field. He has no wheezes. He has no rhonchi. He has no rales.  GI: Soft. Bowel sounds are normal. There is no tenderness.  Musculoskeletal:       Right shoulder: He exhibits no swelling.  Lymphadenopathy:    He has no cervical adenopathy.  Neurological: He is alert. No cranial nerve deficit.  Skin: Skin is warm. No rash noted. Nails show no clubbing.   Psychiatric: He has a normal mood and affect.      Data Reviewed: Basic Metabolic Panel: Recent Labs  Lab 02/07/17 2006 02/09/17 0955 02/10/17 0420  NA 136 127* 136  K 4.2 3.8 3.9  CL 100* 95* 102  CO2 27 22 26   GLUCOSE 148* 187* 153*  BUN 22* 33* 31*  CREATININE 1.33* 1.44* 1.40*  CALCIUM 8.7* 8.2* 8.3*   Liver Function Tests: Recent Labs  Lab 02/07/17 2006  AST 28  ALT 25  ALKPHOS 63  BILITOT 0.9  PROT 7.5  ALBUMIN 3.8   CBC: Recent Labs  Lab 02/07/17 2006 02/09/17 0955  WBC 8.1 11.9*  HGB 13.3 12.1*  HCT 40.3 36.7*  MCV 88.9 88.4  PLT 131* 139*   Cardiac Enzymes: Recent Labs  Lab 02/07/17 2006 02/08/17 0144  TROPONINI <0.03 <0.03    CBG: Recent Labs  Lab 02/09/17 1135 02/09/17 1720 02/09/17 2043 02/10/17 0745 02/10/17 1134  GLUCAP 178* 153* 155* 153* 163*      Studies: Dg Chest 2 View  Result Date: 02/09/2017 CLINICAL DATA:  Shortness of breath. EXAM: CHEST  2 VIEW COMPARISON:  02/07/2017. FINDINGS: Cardiomegaly with pulmonary venous congestion and bilateral interstitial prominence. Small right pleural effusion. No pneumothorax. IMPRESSION: Cardiomegaly with pulmonary venous congestion and bilateral interstitial prominence. Small right pleural effusion. Findings suggest mild CHF. Electronically Signed   By: Marcello Moores  Register   On: 02/09/2017 10:31  Scheduled Meds: . aspirin EC  81 mg Oral Daily  . benazepril  10 mg Oral Daily  . budesonide (PULMICORT) nebulizer solution  0.5 mg Nebulization BID  . [START ON 02/11/2017] digoxin  0.125 mg Intravenous Daily  . docusate sodium  100 mg Oral BID  . donepezil  5 mg Oral QHS  . DULoxetine  60 mg Oral Daily  . enoxaparin (LOVENOX) injection  40 mg Subcutaneous Q24H  . ferrous sulfate  325 mg Oral Q lunch  . furosemide  20 mg Oral BID  . insulin aspart  0-5 Units Subcutaneous Once  . insulin aspart  0-9 Units Subcutaneous TID WC  . ipratropium-albuterol  3 mL Inhalation Q6H  .  levothyroxine  112 mcg Oral QAC breakfast  . metoprolol tartrate  50 mg Oral TID  . mometasone-formoterol  2 puff Inhalation BID  . omega-3 acid ethyl esters  2 g Oral BID  . pantoprazole  40 mg Oral Daily    Assessment/Plan:  1. Atrial fibrillation with rapid ventricular response.  Patient had to 2-second pauses.  Metoprolol was increased to 100 mg twice daily but then changed back to 50 mg 3 times daily.  dose of digoxin decreased.  Watch on telemetry one more day 2. Relative hypotension.  Norvasc and hydralazine stopped. 3. Acute diastolic congestive heart failure on oral Lasix. metoprolol 4. Hypothyroidism unspecified on levothyroxine 5. Overnight oximetry showed desaturations and patient qualifies for nighttime oxygen 6. Dementia on Aricept 7. Hyponatremia.  Better today  Code Status:     Code Status Orders  (From admission, onward)        Start     Ordered   02/08/17 0615  Full code  Continuous     02/08/17 0615    Code Status History    Date Active Date Inactive Code Status Order ID Comments User Context   08/06/2014 18:21 08/12/2014 20:19 Full Code [id number]  Aldean Jewett, MD Inpatient     Family Communication: Spoke with son at bedside Disposition Plan: Potentially home tomorrow  Consultants:  Cardiology  Time spent: 28 minutes, spoke with care manager about setting up home oxygen  [place]

## 2017-02-11 LAB — BASIC METABOLIC PANEL
ANION GAP: 7 (ref 5–15)
BUN: 24 mg/dL — AB (ref 6–20)
CALCIUM: 8.3 mg/dL — AB (ref 8.9–10.3)
CO2: 30 mmol/L (ref 22–32)
Chloride: 99 mmol/L — ABNORMAL LOW (ref 101–111)
Creatinine, Ser: 1.22 mg/dL (ref 0.61–1.24)
GFR calc Af Amer: >60 mL/min (ref >60)
GFR, EST NON AFRICAN AMERICAN: 55 mL/min — AB (ref >60)
GLUCOSE: 135 mg/dL — AB (ref 65–99)
Potassium: 4.3 mmol/L (ref 3.5–5.1)
Sodium: 136 mmol/L (ref 135–145)

## 2017-02-11 LAB — GLUCOSE, CAPILLARY
Glucose-Capillary: 145 mg/dL — ABNORMAL HIGH (ref 65–99)
Glucose-Capillary: 158 mg/dL — ABNORMAL HIGH (ref 65–99)
Glucose-Capillary: 142 mg/dL — ABNORMAL HIGH (ref 65–99)

## 2017-02-11 LAB — DIGOXIN LEVEL: Digoxin Level: 0.7 ng/mL — ABNORMAL LOW (ref 0.8–2.0)

## 2017-02-11 MED ORDER — METOPROLOL TARTRATE 50 MG PO TABS
50.0000 mg | ORAL_TABLET | Freq: Once | ORAL | Status: AC
  Administered 2017-02-11: 50 mg via ORAL
  Filled 2017-02-11: qty 1

## 2017-02-11 MED ORDER — DIGOXIN 125 MCG PO TABS: 0.1250 mg | ORAL_TABLET | Freq: Every day | ORAL | 0 refills | Status: DC

## 2017-02-11 MED ORDER — GLIMEPIRIDE 1 MG PO TABS: 1.0000 mg | ORAL_TABLET | Freq: Every day | ORAL | 0 refills | Status: DC

## 2017-02-11 MED ORDER — METOPROLOL TARTRATE 100 MG PO TABS: 100.0000 mg | ORAL_TABLET | Freq: Two times a day (BID) | ORAL | 0 refills | Status: DC

## 2017-02-11 NOTE — Discharge Summary (Addendum)
[facility] NAME: Derek Shelton    MR#:  [id number]  DATE OF BIRTH:  1940-07-21  DATE OF ADMISSION:  02/08/2017 ADMITTING PHYSICIAN: Harrie Foreman, MD  DATE OF DISCHARGE: 02/11/2017  1:49 PM  PRIMARY CARE PHYSICIAN: Perrin Maltese, MD    ADMISSION DIAGNOSIS:  Shortness of breath [R06.02] Hypoxia [R09.02] COPD exacerbation (HCC) [J44.1] Acute on chronic congestive heart failure, unspecified heart failure type ([place]) [I50.9] Atrial fibrillation with RVR ([place]) [I48.91]  DISCHARGE DIAGNOSIS:  Active Problems:   SOB (shortness of breath)   Atrial fibrillation with RVR ([place])   SECONDARY DIAGNOSIS:   Past Medical History:  Diagnosis Date  . Anemia   . Asthma   . CHF (congestive heart failure) ([place])   . COPD (chronic obstructive pulmonary disease) ([place])   . Coronary artery disease   . Dementia    Per son's report  . Diabetes mellitus without complication ([place])   . Dysrhythmia    Atrial Fibrillation  . GERD (gastroesophageal reflux disease)   . Hyperlipidemia   . Hypertension   . Hypothyroidism   . Shortness of breath dyspnea   . Sleep apnea   . Thyroid disease     HOSPITAL COURSE:   1.  Atrial fibrillation with rapid ventricular response.  The patient was receiving metoprolol and IV digoxin while here in the hospital.  The patient had two 2-second pauses.  The patient's digoxin was decreased to 0.125 mg daily.  Cardiology recommended metoprolol 100 mg twice a day.  Follow-up as outpatient.  Patient had a GI bleed in the past on Xarelto so he is on aspirin only for anticoagulation. 2.  Relative hypotension.  Norvasc and hydralazine were stopped. 3.  Acute diastolic congestive heart failure.  The patient was given IV Lasix and then switched over to oral Lasix.  Lungs are clear upon discharge home.  Oral Lasix and metoprolol continued.  Patient also on benazepril. 4.  Sleep apnea unable to tolerate CPAP at home.  The  patient had an overnight oximetry and desaturated 11% of the time in the 80s and 1% of the time in the 70s.  Home oxygen 2 L nasal cannula set up at night. 5.  Hypothyroidism unspecified on levothyroxine 6.  Dementia on Aricept 7.  Hyponatremia improved with diuresis. 8.  Chronic kidney disease stage III  DISCHARGE CONDITIONS:   Satisfactory  CONSULTS OBTAINED:  Treatment Team:  Dionisio David, MD  DRUG ALLERGIES:   Allergies  Allergen Reactions  . Catapres [Clonidine Hcl]   . Coreg [Carvedilol]   . Penicillins     DISCHARGE MEDICATIONS:   Allergies as of 02/11/2017      Reactions   Catapres [clonidine Hcl]    Coreg [carvedilol]    Penicillins       Medication List    STOP taking these medications   amLODipine 10 MG tablet Commonly known as:  NORVASC   hydrALAZINE 50 MG tablet Commonly known as:  APRESOLINE   metoprolol succinate 100 MG 24 hr tablet Commonly known as:  TOPROL-XL     TAKE these medications   aspirin EC 81 MG tablet Take 81 mg by mouth daily.   benazepril 10 MG tablet Commonly known as:  LOTENSIN Take 10 mg by mouth daily.   budesonide-formoterol 160-4.5 MCG/ACT inhaler Commonly known as:  SYMBICORT Inhale 2 puffs into the lungs 2 (two) times daily.   digoxin 0.125 MG tablet Commonly known as:  LANOXIN  Take 1 tablet (0.125 mg total) by mouth daily. What changed:    medication strength  how much to take   donepezil 5 MG tablet Commonly known as:  ARICEPT Take 5 mg by mouth at bedtime.   DULoxetine 60 MG capsule Commonly known as:  CYMBALTA Take 60 mg by mouth daily.   esomeprazole 40 MG capsule Commonly known as:  NEXIUM Take 40 mg by mouth daily at 12 noon.   ferrous sulfate 325 (65 FE) MG EC tablet Take 325 mg by mouth daily.   furosemide 20 MG tablet Commonly known as:  LASIX Take 20 mg by mouth 2 (two) times daily.   glimepiride 1 MG tablet Commonly known as:  AMARYL Take 1 tablet (1 mg total) by mouth daily with  breakfast. What changed:    medication strength  how much to take  when to take this   ipratropium-albuterol 0.5-2.5 (3) MG/3ML Soln Commonly known as:  DUONEB Inhale 3 mLs into the lungs every 6 (six) hours as needed for shortness of breath.   levothyroxine 112 MCG tablet Commonly known as:  SYNTHROID, LEVOTHROID Take 112 mcg by mouth daily before breakfast.   Melatonin 3 MG Tabs Take 3 mg by mouth at bedtime as needed.   metoprolol tartrate 100 MG tablet Commonly known as:  LOPRESSOR Take 1 tablet (100 mg total) by mouth 2 (two) times daily.   omega-3 acid ethyl esters 1 g capsule Commonly known as:  LOVAZA Take 2 g by mouth 2 (two) times daily.            Durable Medical Equipment  (From admission, onward)        Start     Ordered   02/11/17 1036  For home use only DME oxygen  Once    Comments:  They would like a portable tank to travel with  Question Answer Comment  Mode or (Route) Nasal cannula   Liters per Minute 2   Frequency Only at night (stationary unit needed)   Oxygen conserving device Yes   Oxygen delivery system Gas      02/11/17 1035   02/10/17 1232  For home use only DME oxygen  Once    Question Answer Comment  Mode or (Route) Nasal cannula   Liters per Minute 2   Frequency Only at night (stationary unit needed)   Oxygen conserving device Yes   Oxygen delivery system Gas      02/10/17 1231       DISCHARGE INSTRUCTIONS:   Follow-up with Dr. Chancy Milroy 1 week Home health set up  If you experience worsening of your admission symptoms, develop shortness of breath, life threatening emergency, suicidal or homicidal thoughts you must seek medical attention immediately by calling 911 or calling your MD immediately  if symptoms less severe.  You Must read complete instructions/literature along with all the possible adverse reactions/side effects for all the Medicines you take and that have been prescribed to you. Take any new Medicines after you  have completely understood and accept all the possible adverse reactions/side effects.   Please note  You were cared for by a hospitalist during your hospital stay. If you have any questions about your discharge medications or the care you received while you were in the hospital after you are discharged, you can call the unit and asked to speak with the hospitalist on call if the hospitalist that took care of you is not available. Once you are discharged, your primary care physician  will handle any further medical issues. Please note that NO REFILLS for any discharge medications will be authorized once you are discharged, as it is imperative that you return to your primary care physician (or establish a relationship with a primary care physician if you do not have one) for your aftercare needs so that they can reassess your need for medications and monitor your lab values.    Today   CHIEF COMPLAINT:   Chief Complaint  Patient presents with  . Shortness of Breath    HISTORY OF PRESENT ILLNESS:  Derek Shelton  is a 77 y.o. male with a known history of atrial fibrillation presented with shortness of breath   VITAL SIGNS:  Blood pressure 126/74, pulse 70, temperature 97.9 F (36.6 C), temperature source Oral, resp. rate 18, height 5\' 5"  (1.651 m), weight 96.3 kg (212 lb 4.8 oz), SpO2 95 %.    PHYSICAL EXAMINATION:  GENERAL:  77 y.o.-year-old patient lying in the bed with no acute distress.  EYES: Pupils equal, round, reactive to light and accommodation. No scleral icterus. Extraocular muscles intact.  HEENT: Head atraumatic, normocephalic. Oropharynx and nasopharynx clear.  NECK:  Supple, no jugular venous distention. No thyroid enlargement, no tenderness.  LUNGS: Normal breath sounds bilaterally, no wheezing, rales,rhonchi or crepitation. No use of accessory muscles of respiration.  CARDIOVASCULAR: S1, S2 irregularly regular. No murmurs, rubs, or gallops.  ABDOMEN: Soft, non-tender,  non-distended. Bowel sounds present. No organomegaly or mass.  EXTREMITIES: No pedal edema, cyanosis, or clubbing.  NEUROLOGIC: Cranial nerves II through XII are intact. Muscle strength 5/5 in all extremities. Sensation intact. Gait not checked.  PSYCHIATRIC: The patient is alert and oriented x 3.  SKIN: No obvious rash, lesion, or ulcer.   DATA REVIEW:   CBC Recent Labs  Lab 02/09/17 0955  WBC 11.9*  HGB 12.1*  HCT 36.7*  PLT 139*    Chemistries  Recent Labs  Lab 02/07/17 2006  02/11/17 0455  NA 136   < > 136  K 4.2   < > 4.3  CL 100*   < > 99*  CO2 27   < > 30  GLUCOSE 148*   < > 135*  BUN 22*   < > 24*  CREATININE 1.33*   < > 1.22  CALCIUM 8.7*   < > 8.3*  AST 28  --   --   ALT 25  --   --   ALKPHOS 63  --   --   BILITOT 0.9  --   --    < > = values in this interval not displayed.    Cardiac Enzymes Recent Labs  Lab 02/08/17 0144  TROPONINI <0.03      Management plans discussed with the patient, family and they are in agreement.  CODE STATUS:     Code Status Orders  (From admission, onward)        Start     Ordered   02/08/17 0615  Full code  Continuous     02/08/17 0615    Code Status History    Date Active Date Inactive Code Status Order ID Comments User Context   08/06/2014 18:21 08/12/2014 20:19 Full Code [id number]  Aldean Jewett, MD Inpatient      TOTAL TIME TAKING CARE OF THIS PATIENT: 35 minutes.    [name] M.D on 02/11/2017 at 4:26 PM  Between 7am to 6pm - Pager - [phone]  After 6pm go to [email or web address] - [place]  [facility]  [phone]  CC: Primary care physician; Perrin Maltese, MD

## 2017-02-11 NOTE — Progress Notes (Addendum)
Interpreter was used Name Georges Lynch [phone] regarding the plan for pt today. Also discuseed if pt have any question and concerns that he wants to talk to the nurse and doctor. Pt states that he dont have no concern or question art the moment. Ask if pt have any pain. He answered none as of the moment. Will continue to monitor.

## 2017-02-11 NOTE — Progress Notes (Signed)
Communicated with patient and spouse via Industrial/product designer [phone]. Patient denies any complaints of pain. Spouse is at the bedside for safety and familiarity. Explained to patient and spouse about assessment, medications, and how to contact staff.

## 2017-02-11 NOTE — Discharge Instructions (Signed)
Heart Failure Heart failure means your heart has trouble pumping blood. This makes it hard for your body to work well. Heart failure is usually a long-term (chronic) condition. You must take good care of yourself and follow your doctor's treatment plan. Follow these instructions at home:  Take your heart medicine as told by your doctor. ? Do not stop taking medicine unless your doctor tells you to. ? Do not skip any dose of medicine. ? Refill your medicines before they run out. ? Take other medicines only as told by your doctor or pharmacist.  Stay active if told by your doctor. The elderly and people with severe heart failure should talk with a doctor about physical activity.  Eat heart-healthy foods. Choose foods that are without trans fat and are low in saturated fat, cholesterol, and salt (sodium). This includes fresh or frozen fruits and vegetables, fish, lean meats, fat-free or low-fat dairy foods, whole grains, and high-fiber foods. Lentils and dried peas and beans (legumes) are also good choices.  Limit salt if told by your doctor.  Cook in a healthy way. Roast, grill, broil, bake, poach, steam, or stir-fry foods.  Limit fluids as told by your doctor.  Weigh yourself every morning. Do this after you pee (urinate) and before you eat breakfast. Write down your weight to give to your doctor.  Take your blood pressure and write it down if your doctor tells you to.  Ask your doctor how to check your pulse. Check your pulse as told.  Lose weight if told by your doctor.  Stop smoking or chewing tobacco. Do not use gum or patches that help you quit without your doctor's approval.  Schedule and go to doctor visits as told.  Nonpregnant women should have no more than 1 drink a day. Men should have no more than 2 drinks a day. Talk to your doctor about drinking alcohol.  Stop illegal drug use.  Stay current with shots (immunizations).  Manage your health conditions as told by  your doctor.  Learn to manage your stress.  Rest when you are tired.  If it is really hot outside: ? Avoid intense activities. ? Use air conditioning or fans, or get in a cooler place. ? Avoid caffeine and alcohol. ? Wear loose-fitting, lightweight, and light-colored clothing.  If it is really cold outside: ? Avoid intense activities. ? Layer your clothing. ? Wear mittens or gloves, a hat, and a scarf when going outside. ? Avoid alcohol.  Learn about heart failure and get support as needed.  Get help to maintain or improve your quality of life and your ability to care for yourself as needed. Contact a doctor if:  You gain weight quickly.  You are more short of breath than usual.  You cannot do your normal activities.  You tire easily.  You cough more than normal, especially with activity.  You have any or more puffiness (swelling) in areas such as your hands, feet, ankles, or belly (abdomen).  You cannot sleep because it is hard to breathe.  You feel like your heart is beating fast (palpitations).  You get dizzy or light-headed when you stand up. Get help right away if:  You have trouble breathing.  There is a change in mental status, such as becoming less alert or not being able to focus.  You have chest pain or discomfort.  You faint. This information is not intended to replace advice given to you by your health care provider. Make sure  you discuss any questions you have with your health care provider. Document Released: 10/20/2007 Document Revised: 06/18/2015 Document Reviewed: 02/27/2012 Elsevier Interactive Patient Education  2017 [facility] appointment on February 17 2017 at 11:40am with Darylene Price, [place]. Please call [phone] to reschedule.

## 2017-02-11 NOTE — Progress Notes (Signed)
SUBJECTIVE: Patient is feeling much better to   Vitals:   02/11/17 0341 02/11/17 0742 02/11/17 0752 02/11/17 0908  BP: 131/83 (!) 133/92  113/62  Pulse: (!) 52 85  75  Resp: 18 18    Temp: 98.2 F (36.8 C) 97.9 F (36.6 C)    TempSrc: Oral Oral    SpO2: 98% 100% 96% 95%  Weight: 212 lb 4.8 oz (96.3 kg)     Height:        Intake/Output Summary (Last 24 hours) at 02/11/2017 1105 Last data filed at 02/11/2017 0341 Gross per 24 hour  Intake 240 ml  Output 450 ml  Net -210 ml    LABS: Basic Metabolic Panel: Recent Labs    02/10/17 0420 02/11/17 0455  NA 136 136  K 3.9 4.3  CL 102 99*  CO2 26 30  GLUCOSE 153* 135*  BUN 31* 24*  CREATININE 1.40* 1.22  CALCIUM 8.3* 8.3*   Liver Function Tests: No results for input(s): AST, ALT, ALKPHOS, BILITOT, PROT, ALBUMIN in the last 72 hours. No results for input(s): LIPASE, AMYLASE in the last 72 hours. CBC: Recent Labs    02/09/17 0955  WBC 11.9*  HGB 12.1*  HCT 36.7*  MCV 88.4  PLT 139*   Cardiac Enzymes: No results for input(s): CKTOTAL, CKMB, CKMBINDEX, TROPONINI in the last 72 hours. BNP: Invalid input(s): POCBNP D-Dimer: No results for input(s): DDIMER in the last 72 hours. Hemoglobin A1C: No results for input(s): HGBA1C in the last 72 hours. Fasting Lipid Panel: No results for input(s): CHOL, HDL, LDLCALC, TRIG, CHOLHDL, LDLDIRECT in the last 72 hours. Thyroid Function Tests: No results for input(s): TSH, T4TOTAL, T3FREE, THYROIDAB in the last 72 hours.  Invalid input(s): FREET3 Anemia Panel: No results for input(s): VITAMINB12, FOLATE, FERRITIN, TIBC, IRON, RETICCTPCT in the last 72 hours.   PHYSICAL EXAM General: Well developed, well nourished, in no acute distress HEENT:  Normocephalic and atramatic Neck:  No JVD.  Lungs: Clear bilaterally to auscultation and percussion. Heart: HRRR . Normal S1 and S2 without gallops or murmurs.  Abdomen: Bowel sounds are positive, abdomen soft and non-tender  Msk:   Back normal, normal gait. Normal strength and tone for age. Extremities: No clubbing, cyanosis or edema.   Neuro: Alert and oriented X 3. Psych:  Good affect, responds appropriately  TELEMETRY: Atrial fibrillation with controlled ventricular rate approximately 90/min  ASSESSMENT AND PLAN: Atrial fibrillation with controlled ventricular rate and normal left ventricular systolic function and moderate mitral regurgitation with history of noncompliance to CPAP with severe sleep apnea.  Translated with the hospitalist about all the medication and will go home on 0.125 digoxin 200 mg daily of metoprolol along with oxygen.  Active Problems:   SOB (shortness of breath)   Atrial fibrillation with RVR (HCC)    Derek Shelton A, MD, [facility] 02/11/2017 11:05 AM    Of 2

## 2017-02-11 NOTE — Progress Notes (Signed)
Pt d/c to home today.  IV removed intact.  D/c paperwork printed and reviewed w/pt.  All medication questions and concerns reviewed and pt states understanding.  All Rx's given to patient. Pt requested to wheelchaired out for d/c.    

## 2017-02-11 NOTE — Plan of Care (Signed)
  Progressing Clinical Measurements: Ability to maintain clinical measurements within normal limits will improve 02/11/2017 1601 - Progressing by Liliane Channel, RN Respiratory complications will improve 02/11/2017 1601 - Progressing by Liliane Channel, RN Activity: Risk for activity intolerance will decrease 02/11/2017 1601 - Progressing by Liliane Channel, RN Pain Managment: General experience of comfort will improve 02/11/2017 1601 - Progressing by Liliane Channel, RN Safety: Ability to remain free from injury will improve 02/11/2017 1601 - Progressing by Liliane Channel, RN

## 2017-02-11 NOTE — Care Management Note (Signed)
Case Management Note  Patient Details  Name: Derek Shelton MRN: [id number] Date of Birth: 11-Dec-1940  Subjective/Objective:      A referral for HH=PT and nocturnal 02 and CPAP was called to Melene Muller at [facility]. Derek Shelton is requesting a portable 02 tank to take with him on a religious pilgrimage starting February 24, 2017. Derek Shelton's family was advised to discuss this with the [facility] office due to taking [facility] equipment out of the area.               Action/Plan:   Expected Discharge Date:  02/11/17               Expected Discharge Plan:     In-House Referral:     Discharge planning Services     Post Acute Care Choice:    Choice offered to:     DME Arranged:    DME Agency:     HH Arranged:    HH Agency:     Status of Service:     If discussed at [employer], dates discussed:    Additional Comments:  Derek Shelton A, RN 02/11/2017, 11:01 AM

## 2017-02-17 ENCOUNTER — Ambulatory Visit: Payer: Medicare Other | Admitting: Family

## 2017-02-21 ENCOUNTER — Emergency Department: Payer: Medicare Other

## 2017-02-21 ENCOUNTER — Encounter: Payer: Self-pay | Admitting: Emergency Medicine

## 2017-02-21 ENCOUNTER — Inpatient Hospital Stay
Admission: EM | Admit: 2017-02-21 | Discharge: 2017-02-22 | DRG: 683 | Disposition: A | Discharge status: Home / Self Care | Payer: Medicare Other | Attending: Internal Medicine | Admitting: Internal Medicine

## 2017-02-21 ENCOUNTER — Other Ambulatory Visit: Payer: Self-pay

## 2017-02-21 ENCOUNTER — Inpatient Hospital Stay
Admit: 2017-02-21 | Discharge: 2017-02-21 | Disposition: A | Discharge status: Home / Self Care | Payer: Medicare Other | Attending: Internal Medicine | Admitting: Internal Medicine

## 2017-02-21 ENCOUNTER — Observation Stay: Payer: Medicare Other

## 2017-02-21 DIAGNOSIS — I2510 Atherosclerotic heart disease of native coronary artery without angina pectoris: Secondary | ICD-10-CM | POA: Diagnosis present

## 2017-02-21 DIAGNOSIS — Z7951 Long term (current) use of inhaled steroids: Secondary | ICD-10-CM

## 2017-02-21 DIAGNOSIS — R197 Diarrhea, unspecified: Secondary | ICD-10-CM | POA: Diagnosis not present

## 2017-02-21 DIAGNOSIS — J449 Chronic obstructive pulmonary disease, unspecified: Secondary | ICD-10-CM | POA: Diagnosis present

## 2017-02-21 DIAGNOSIS — G4730 Sleep apnea, unspecified: Secondary | ICD-10-CM | POA: Diagnosis present

## 2017-02-21 DIAGNOSIS — R079 Chest pain, unspecified: Secondary | ICD-10-CM | POA: Diagnosis present

## 2017-02-21 DIAGNOSIS — I130 Hypertensive heart and chronic kidney disease with heart failure and stage 1 through stage 4 chronic kidney disease, or unspecified chronic kidney disease: Secondary | ICD-10-CM | POA: Diagnosis present

## 2017-02-21 DIAGNOSIS — N179 Acute kidney failure, unspecified: Principal | ICD-10-CM | POA: Diagnosis present

## 2017-02-21 DIAGNOSIS — Z888 Allergy status to other drugs, medicaments and biological substances status: Secondary | ICD-10-CM

## 2017-02-21 DIAGNOSIS — E1122 Type 2 diabetes mellitus with diabetic chronic kidney disease: Secondary | ICD-10-CM | POA: Diagnosis present

## 2017-02-21 DIAGNOSIS — N281 Cyst of kidney, acquired: Secondary | ICD-10-CM | POA: Diagnosis not present

## 2017-02-21 DIAGNOSIS — Z880 Allergy status to penicillin: Secondary | ICD-10-CM | POA: Diagnosis not present

## 2017-02-21 DIAGNOSIS — I509 Heart failure, unspecified: Secondary | ICD-10-CM | POA: Diagnosis not present

## 2017-02-21 DIAGNOSIS — N289 Disorder of kidney and ureter, unspecified: Secondary | ICD-10-CM

## 2017-02-21 DIAGNOSIS — Z7982 Long term (current) use of aspirin: Secondary | ICD-10-CM

## 2017-02-21 DIAGNOSIS — I4891 Unspecified atrial fibrillation: Secondary | ICD-10-CM | POA: Diagnosis not present

## 2017-02-21 DIAGNOSIS — F0390 Unspecified dementia without behavioral disturbance: Secondary | ICD-10-CM | POA: Diagnosis not present

## 2017-02-21 DIAGNOSIS — K219 Gastro-esophageal reflux disease without esophagitis: Secondary | ICD-10-CM | POA: Diagnosis present

## 2017-02-21 DIAGNOSIS — N183 Chronic kidney disease, stage 3 (moderate): Secondary | ICD-10-CM | POA: Diagnosis not present

## 2017-02-21 DIAGNOSIS — E039 Hypothyroidism, unspecified: Secondary | ICD-10-CM | POA: Diagnosis present

## 2017-02-21 DIAGNOSIS — Z87891 Personal history of nicotine dependence: Secondary | ICD-10-CM | POA: Diagnosis not present

## 2017-02-21 DIAGNOSIS — Z79899 Other long term (current) drug therapy: Secondary | ICD-10-CM | POA: Diagnosis not present

## 2017-02-21 DIAGNOSIS — E860 Dehydration: Secondary | ICD-10-CM | POA: Diagnosis present

## 2017-02-21 DIAGNOSIS — E785 Hyperlipidemia, unspecified: Secondary | ICD-10-CM | POA: Diagnosis not present

## 2017-02-21 LAB — COMPREHENSIVE METABOLIC PANEL
ALK PHOS: 69 U/L (ref 38–126)
ALT: 27 U/L (ref 17–63)
AST: 34 U/L (ref 15–41)
Albumin: 4.2 g/dL (ref 3.5–5.0)
Anion gap: 11 (ref 5–15)
BILIRUBIN TOTAL: 1.1 mg/dL (ref 0.3–1.2)
BUN: 29 mg/dL — ABNORMAL HIGH (ref 6–20)
CALCIUM: 9.2 mg/dL (ref 8.9–10.3)
CO2: 23 mmol/L (ref 22–32)
CREATININE: 1.83 mg/dL — AB (ref 0.61–1.24)
Chloride: 96 mmol/L — ABNORMAL LOW (ref 101–111)
GFR, EST AFRICAN AMERICAN: 39 mL/min — AB (ref >60)
GFR, EST NON AFRICAN AMERICAN: 34 mL/min — AB (ref >60)
Glucose, Bld: 160 mg/dL — ABNORMAL HIGH (ref 65–99)
Potassium: 4.3 mmol/L (ref 3.5–5.1)
SODIUM: 130 mmol/L — AB (ref 135–145)
Total Protein: 8.3 g/dL — ABNORMAL HIGH (ref 6.5–8.1)

## 2017-02-21 LAB — URINALYSIS, COMPLETE (UACMP) WITH MICROSCOPIC
Bacteria, UA: NONE SEEN
Bilirubin Urine: NEGATIVE
Glucose, UA: NEGATIVE mg/dL
Hgb urine dipstick: NEGATIVE
Ketones, ur: NEGATIVE mg/dL
Leukocytes, UA: NEGATIVE
Nitrite: NEGATIVE
Protein, ur: NEGATIVE mg/dL
Specific Gravity, Urine: 1.003 — ABNORMAL LOW (ref 1.005–1.030)
Squamous Epithelial / LPF: NONE SEEN
pH: 6 (ref 5.0–8.0)

## 2017-02-21 LAB — GASTROINTESTINAL PANEL BY PCR, STOOL (REPLACES STOOL CULTURE)

## 2017-02-21 LAB — TSH: TSH: 1.695 u[IU]/mL (ref 0.350–4.500)

## 2017-02-21 LAB — CBC
HCT: 50.9 % (ref 40.0–52.0)
Hemoglobin: 16.4 g/dL (ref 13.0–18.0)
MCH: 28.2 pg (ref 26.0–34.0)
MCHC: 32.3 g/dL (ref 32.0–36.0)
MCV: 87.4 fL (ref 80.0–100.0)
PLATELETS: 248 10*3/uL (ref 150–440)
RBC: 5.82 MIL/uL (ref 4.40–5.90)
RDW: 14.3 % (ref 11.5–14.5)
WBC: 15.4 10*3/uL — ABNORMAL HIGH (ref 3.8–10.6)

## 2017-02-21 LAB — C DIFFICILE QUICK SCREEN W PCR REFLEX
C Diff antigen: NEGATIVE
C Diff interpretation: NOT DETECTED
C Diff toxin: NEGATIVE

## 2017-02-21 LAB — GLUCOSE, CAPILLARY
Glucose-Capillary: 90 mg/dL (ref 65–99)
Glucose-Capillary: 111 mg/dL — ABNORMAL HIGH (ref 65–99)
Glucose-Capillary: 153 mg/dL — ABNORMAL HIGH (ref 65–99)

## 2017-02-21 LAB — TROPONIN I
Troponin I: 0.04 ng/mL (ref <0.03)
Troponin I: 0.03 ng/mL (ref <0.03)

## 2017-02-21 LAB — LIPASE, BLOOD: Lipase: 54 U/L — ABNORMAL HIGH (ref 11–51)

## 2017-02-21 LAB — DIGOXIN LEVEL: DIGOXIN LVL: 0.5 ng/mL — AB (ref 0.8–2.0)

## 2017-02-21 MED ORDER — BENAZEPRIL HCL 10 MG PO TABS
10.0000 mg | ORAL_TABLET | Freq: Every day | ORAL | Status: DC
  Administered 2017-02-21: 10 mg via ORAL
  Filled 2017-02-21 (×2): qty 1

## 2017-02-21 MED ORDER — HEPARIN SODIUM (PORCINE) 5000 UNIT/ML IJ SOLN
5000.0000 [IU] | Freq: Three times a day (TID) | INTRAMUSCULAR | Status: DC
  Administered 2017-02-21 – 2017-02-22 (×3): 5000 [IU] via SUBCUTANEOUS
  Filled 2017-02-21 (×3): qty 1

## 2017-02-21 MED ORDER — ACETAMINOPHEN 650 MG RE SUPP: 650.0000 mg | Freq: Four times a day (QID) | RECTAL | Status: DC | PRN

## 2017-02-21 MED ORDER — ACETAMINOPHEN 325 MG PO TABS: 650.0000 mg | ORAL_TABLET | Freq: Four times a day (QID) | ORAL | Status: DC | PRN

## 2017-02-21 MED ORDER — MOMETASONE FURO-FORMOTEROL FUM 200-5 MCG/ACT IN AERO
2.0000 | INHALATION_SPRAY | Freq: Two times a day (BID) | RESPIRATORY_TRACT | Status: DC
  Administered 2017-02-21 – 2017-02-22 (×2): 2 via RESPIRATORY_TRACT
  Filled 2017-02-21 (×2): qty 8.8

## 2017-02-21 MED ORDER — LEVOTHYROXINE SODIUM 112 MCG PO TABS
112.0000 ug | ORAL_TABLET | Freq: Every day | ORAL | Status: DC
Start: 2017-02-21 — End: 2017-02-22
  Administered 2017-02-21 – 2017-02-22 (×2): 112 ug via ORAL
  Filled 2017-02-21 (×2): qty 1

## 2017-02-21 MED ORDER — SODIUM CHLORIDE 0.9 % IV BOLUS (SEPSIS): 1000.0000 mL | Freq: Once | INTRAVENOUS | Status: DC

## 2017-02-21 MED ORDER — ONDANSETRON HCL 4 MG/2ML IJ SOLN: 4.0000 mg | Freq: Four times a day (QID) | INTRAMUSCULAR | Status: DC | PRN

## 2017-02-21 MED ORDER — INSULIN ASPART 100 UNIT/ML ~~LOC~~ SOLN
0.0000 [IU] | Freq: Three times a day (TID) | SUBCUTANEOUS | Status: DC
  Administered 2017-02-22 (×2): 1 [IU] via SUBCUTANEOUS
  Administered 2017-02-22: 2 [IU] via SUBCUTANEOUS
  Filled 2017-02-21 (×3): qty 1

## 2017-02-21 MED ORDER — DONEPEZIL HCL 5 MG PO TABS
5.0000 mg | ORAL_TABLET | Freq: Every day | ORAL | Status: DC
  Administered 2017-02-21: 5 mg via ORAL
  Filled 2017-02-21 (×2): qty 1

## 2017-02-21 MED ORDER — LOPERAMIDE HCL 2 MG PO CAPS
2.0000 mg | ORAL_CAPSULE | Freq: Four times a day (QID) | ORAL | Status: DC | PRN
Start: 2017-02-21 — End: 2017-02-22

## 2017-02-21 MED ORDER — METOPROLOL TARTRATE 50 MG PO TABS
100.0000 mg | ORAL_TABLET | Freq: Two times a day (BID) | ORAL | Status: DC
  Administered 2017-02-21 (×2): 100 mg via ORAL
  Filled 2017-02-21 (×3): qty 2

## 2017-02-21 MED ORDER — ONDANSETRON HCL 4 MG PO TABS: 4.0000 mg | ORAL_TABLET | Freq: Four times a day (QID) | ORAL | Status: DC | PRN

## 2017-02-21 MED ORDER — DIGOXIN 125 MCG PO TABS
0.1250 mg | ORAL_TABLET | Freq: Every day | ORAL | Status: DC
  Administered 2017-02-21 – 2017-02-22 (×2): 0.125 mg via ORAL
  Filled 2017-02-21 (×2): qty 1

## 2017-02-21 MED ORDER — OMEGA-3-ACID ETHYL ESTERS 1 G PO CAPS
2.0000 g | ORAL_CAPSULE | Freq: Two times a day (BID) | ORAL | Status: DC
  Administered 2017-02-21 – 2017-02-22 (×3): 2 g via ORAL
  Filled 2017-02-21 (×4): qty 2

## 2017-02-21 MED ORDER — FUROSEMIDE 20 MG PO TABS
20.0000 mg | ORAL_TABLET | Freq: Two times a day (BID) | ORAL | Status: DC
  Administered 2017-02-21 – 2017-02-22 (×4): 20 mg via ORAL
  Filled 2017-02-21 (×4): qty 1

## 2017-02-21 MED ORDER — FERROUS SULFATE 325 (65 FE) MG PO TABS
325.0000 mg | ORAL_TABLET | Freq: Every day | ORAL | Status: DC
  Administered 2017-02-21 – 2017-02-22 (×2): 325 mg via ORAL
  Filled 2017-02-21 (×2): qty 1

## 2017-02-21 MED ORDER — SODIUM CHLORIDE 0.9 % IV BOLUS (SEPSIS)
250.0000 mL | Freq: Once | INTRAVENOUS | Status: AC
  Administered 2017-02-21: 250 mL via INTRAVENOUS

## 2017-02-21 MED ORDER — PANTOPRAZOLE SODIUM 40 MG PO TBEC
40.0000 mg | DELAYED_RELEASE_TABLET | Freq: Every day | ORAL | Status: DC
  Administered 2017-02-21 – 2017-02-22 (×2): 40 mg via ORAL
  Filled 2017-02-21 (×2): qty 1

## 2017-02-21 MED ORDER — DOCUSATE SODIUM 100 MG PO CAPS
100.0000 mg | ORAL_CAPSULE | Freq: Two times a day (BID) | ORAL | Status: DC
  Administered 2017-02-21: 100 mg via ORAL
  Filled 2017-02-21: qty 1

## 2017-02-21 MED ORDER — MELATONIN 5 MG PO TABS
2.5000 mg | ORAL_TABLET | Freq: Every evening | ORAL | Status: DC | PRN
  Filled 2017-02-21: qty 0.5

## 2017-02-21 MED ORDER — IPRATROPIUM-ALBUTEROL 0.5-2.5 (3) MG/3ML IN SOLN: 3.0000 mL | Freq: Four times a day (QID) | RESPIRATORY_TRACT | Status: DC | PRN

## 2017-02-21 MED ORDER — SODIUM CHLORIDE 0.9 % IV SOLN
INTRAVENOUS | Status: DC
  Administered 2017-02-21 – 2017-02-22 (×2): via INTRAVENOUS

## 2017-02-21 MED ORDER — IOPAMIDOL (ISOVUE-300) INJECTION 61%
15.0000 mL | INTRAVENOUS | Status: AC
  Administered 2017-02-21 (×2): 15 mL via ORAL

## 2017-02-21 MED ORDER — DULOXETINE HCL 30 MG PO CPEP
60.0000 mg | ORAL_CAPSULE | Freq: Every day | ORAL | Status: DC
  Administered 2017-02-21 – 2017-02-22 (×2): 60 mg via ORAL
  Filled 2017-02-21: qty 2
  Filled 2017-02-21: qty 1

## 2017-02-21 MED ORDER — ASPIRIN 81 MG PO CHEW
324.0000 mg | CHEWABLE_TABLET | Freq: Once | ORAL | Status: AC
  Administered 2017-02-21: 324 mg via ORAL
  Filled 2017-02-21: qty 4

## 2017-02-21 MED ORDER — ASPIRIN EC 81 MG PO TBEC
81.0000 mg | DELAYED_RELEASE_TABLET | Freq: Every day | ORAL | Status: DC
  Administered 2017-02-21 – 2017-02-22 (×2): 81 mg via ORAL
  Filled 2017-02-21 (×2): qty 1

## 2017-02-21 NOTE — Progress Notes (Signed)
*  PRELIMINARY RESULTS* Echocardiogram 2D Echocardiogram has been performed.  Derek Shelton 02/21/2017, 2:06 PM

## 2017-02-21 NOTE — ED Provider Notes (Signed)
[facility] Emergency Department Provider Note   First MD Initiated Contact with Patient 02/21/17 0230     (approximate)  I have reviewed the triage vital signs and the nursing notes.   HISTORY  Chief Complaint Diarrhea and Vomiting    HPI Derek Shelton is a 77 y.o. male with below list of chronic medical conditions including CHF, COPD presents with multiple nonbloody episodes of diarrhea and nonbloody vomiting since Friday.  Patient states that he had approximately 7 bowel movements a day.  Patient does admit to mild abdominal discomfort at this time.  Patient denies any fever afebrile on presentation.  Patiently currently admitting to 6 out of 10 chest pain which began after onset of diarrhea and has persisted since then.  Patient denies any palpitations or dizziness.  Of note patient was recently admitted to the hospital secondary to COPD exacerbation acute on chronic CHF atrial fibrillation with rapid ventricular response.    Past Medical History:  Diagnosis Date  . Anemia   . Asthma   . CHF (congestive heart failure) ([place])   . COPD (chronic obstructive pulmonary disease) ([place])   . Coronary artery disease   . Dementia    Per son's report  . Diabetes mellitus without complication ([place])   . Dysrhythmia    Atrial Fibrillation  . GERD (gastroesophageal reflux disease)   . Hyperlipidemia   . Hypertension   . Hypothyroidism   . Shortness of breath dyspnea   . Sleep apnea   . Thyroid disease     Patient Active Problem List   Diagnosis Date Noted  . Chest pain 02/21/2017  . AKI (acute kidney injury) ([place]) 02/21/2017  . SOB (shortness of breath) 02/08/2017  . Atrial fibrillation with RVR ([place]) 02/08/2017  . GI bleeding 08/06/2014  . Anemia 08/06/2014  . Bradycardia 08/06/2014  . Atrial fibrillation ([place]) 08/06/2014  . Hyponatremia 08/06/2014  . Chronic diastolic heart failure ([place]) 08/06/2014  . Diabetes ([place]) 08/06/2014  . OSA  (obstructive sleep apnea) 08/06/2014    Past Surgical History:  Procedure Laterality Date  . CARDIAC CATHETERIZATION    . COLONOSCOPY N/A 08/10/2014   Procedure: COLONOSCOPY;  Surgeon: Manya Silvas, MD;  Location: [facility] ENDOSCOPY;  Service: Endoscopy;  Laterality: N/A;  . COLONOSCOPY WITH PROPOFOL N/A 04/05/2016   Procedure: COLONOSCOPY WITH PROPOFOL;  Surgeon: Lollie Sails, MD;  Location: [facility] ENDOSCOPY;  Service: Endoscopy;  Laterality: N/A;  . ESOPHAGOGASTRODUODENOSCOPY N/A 08/08/2014   Procedure: ESOPHAGOGASTRODUODENOSCOPY (EGD);  Surgeon: Manya Silvas, MD;  Location: [facility] ENDOSCOPY;  Service: Endoscopy;  Laterality: N/A;  plan for early afternoon  . EYE SURGERY    . GIVENS CAPSULE STUDY N/A 08/12/2014   Procedure: GIVENS CAPSULE STUDY;  Surgeon: Manya Silvas, MD;  Location: [facility] ENDOSCOPY;  Service: Endoscopy;  Laterality: N/A;  . rectal fistula N/A     Prior to Admission medications   Medication Sig Start Date End Date Taking? Authorizing Provider  aspirin EC 81 MG tablet Take 81 mg by mouth daily.   Yes [provider]  benazepril (LOTENSIN) 10 MG tablet Take 10 mg by mouth daily.   Yes [provider]  budesonide-formoterol (SYMBICORT) 160-4.5 MCG/ACT inhaler Inhale 2 puffs into the lungs 2 (two) times daily.   Yes [provider]  digoxin (LANOXIN) 0.125 MG tablet Take 1 tablet (0.125 mg total) by mouth daily. 02/11/17  Yes Wieting, Richard, MD  donepezil (ARICEPT) 5 MG tablet Take 5 mg by mouth at bedtime.  Yes [provider]  DULoxetine (CYMBALTA) 60 MG capsule Take 60 mg by mouth daily.   Yes [provider]  esomeprazole (NEXIUM) 40 MG capsule Take 40 mg by mouth daily at 12 noon.   Yes [provider]  ferrous sulfate 325 (65 FE) MG EC tablet Take 325 mg by mouth daily.   Yes [provider]  furosemide (LASIX) 20 MG tablet Take 20 mg by mouth 2 (two) times daily.   Yes [provider]    glimepiride (AMARYL) 1 MG tablet Take 1 tablet (1 mg total) by mouth daily with breakfast. 02/11/17  Yes Wieting, Richard, MD  ipratropium-albuterol (DUONEB) 0.5-2.5 (3) MG/3ML SOLN Inhale 3 mLs into the lungs every 6 (six) hours as needed for shortness of breath. 01/22/17  Yes [provider]  levothyroxine (SYNTHROID, LEVOTHROID) 112 MCG tablet Take 112 mcg by mouth daily before breakfast.    Yes [provider]  Melatonin 3 MG TABS Take 3 mg by mouth at bedtime as needed.   Yes [provider]  metoprolol tartrate (LOPRESSOR) 100 MG tablet Take 1 tablet (100 mg total) by mouth 2 (two) times daily. 02/11/17  Yes Wieting, Richard, MD  omega-3 acid ethyl esters (LOVAZA) 1 G capsule Take 2 g by mouth 2 (two) times daily.    Yes [provider]    Allergies Catapres [clonidine hcl]; Coreg [carvedilol]; and Penicillins  Family History  Problem Relation Age of Onset  . Diabetes Mother     Social History Social History   Tobacco Use  . Smoking status: Former Research scientist (life sciences)  . Smokeless tobacco: Current User    Types: Chew  Substance Use Topics  . Alcohol use: No  . Drug use: No    Review of Systems Constitutional: No fever/chills Eyes: No visual changes. ENT: No sore throat. Cardiovascular: Positive for chest pain. Respiratory: Denies shortness of breath. Gastrointestinal: Positive for abdominal discomfort vomiting and diarrhea  genitourinary: Negative for dysuria. Musculoskeletal: Negative for neck pain.  Negative for back pain. Integumentary: Negative for rash. Neurological: Negative for headaches, focal weakness or numbness. Psychiatric:   ____________________________________________   PHYSICAL EXAM:  VITAL SIGNS: ED Triage Vitals [02/21/17 0105]  Enc Vitals Group     BP 103/61     Pulse Rate 65     Resp 18     Temp 97.7 F (36.5 C)     Temp Source Oral     SpO2 98 %     Weight 96.2 kg (212 lb)     Height 1.651 m (5\' 5" )     Head  Circumference      Peak Flow      Pain Score 0     Pain Loc      Pain Edu?      Excl. in [place]?     Constitutional: Alert and oriented. Well appearing and in no acute distress. Eyes: Conjunctivae are normal. PERRL. EOMI. Head: Atraumatic. Ears:  Healthy appearing ear canals and TMs bilaterally Nose: No congestion/rhinnorhea. Mouth/Throat: Mucous membranes are dry.  Oropharynx non-erythematous. Neck: No stridor.   Cardiovascular: Tachycardia regular rhythm. Good peripheral circulation. Grossly normal heart sounds. Respiratory: Normal respiratory effort.  No retractions. Lungs CTAB. Gastrointestinal: Soft and nontender. No distention.  Musculoskeletal: No lower extremity tenderness nor edema. No gross deformities of extremities. Neurologic:  Normal speech and language. No gross focal neurologic deficits are appreciated.  Skin:  Skin is warm, dry and intact. No rash noted. Psychiatric: Mood and affect are  normal. Speech and behavior are normal.  ____________________________________________   LABS (all labs ordered are listed, but only abnormal results are displayed)  Labs Reviewed  LIPASE, BLOOD - Abnormal; Notable for the following components:      Result Value   Lipase 54 (*)    All other components within normal limits  COMPREHENSIVE METABOLIC PANEL - Abnormal; Notable for the following components:   Sodium 130 (*)    Chloride 96 (*)    Glucose, Bld 160 (*)    BUN 29 (*)    Creatinine, Ser 1.83 (*)    Total Protein 8.3 (*)    GFR calc non Af Amer 34 (*)    GFR calc Af Amer 39 (*)    All other components within normal limits  CBC - Abnormal; Notable for the following components:   WBC 15.4 (*)    All other components within normal limits  TROPONIN I - Abnormal; Notable for the following components:   Troponin I 0.04 (*)    All other components within normal limits  TROPONIN I - Abnormal; Notable for the following components:   Troponin I 0.03 (*)    All other components  within normal limits  C DIFFICILE QUICK SCREEN W PCR REFLEX  GASTROINTESTINAL PANEL BY PCR, STOOL (REPLACES STOOL CULTURE)  TSH  URINALYSIS, COMPLETE (UACMP) WITH MICROSCOPIC  TROPONIN I  TROPONIN I  DIGOXIN LEVEL   ____________________________________________  EKG  ED ECG REPORT I, [place] N Jhamal Plucinski, the attending physician, personally viewed and interpreted this ECG.   Date: 02/21/2017  EKG Time: 2:16 AM  Rate: 102  Rhythm: Atrial fibrillation with rapid ventricular response  Axis: Normal  Intervals: Irregular PR interval  ST&T Change: Inferior ST segment depression  ____________________________________________  RADIOLOGY I, [place] Cylee Dattilo, personally viewed and evaluated these images (plain radiographs) as part of my medical decision making, as well as reviewing the written report by the radiologist.  ED MD interpretation: No acute intra-abdominal process noted on CT  Official radiology report(s): Ct Abdomen Pelvis Wo Contrast  Result Date: 02/21/2017 CLINICAL DATA:  Diarrhea today and vomiting since Friday. EXAM: CT ABDOMEN AND PELVIS WITHOUT CONTRAST TECHNIQUE: Multidetector CT imaging of the abdomen and pelvis was performed following the standard protocol without IV contrast. COMPARISON:  None. FINDINGS: Unenhanced CT was performed per clinician order. Lack of IV contrast limits sensitivity and specificity, especially for evaluation of abdominal/pelvic solid viscera. Lower chest: Mild dependent changes in the lung bases. Cardiac enlargement. Small esophageal hiatal hernia. Hepatobiliary: No focal liver abnormality is seen. No gallstones, gallbladder wall thickening, or biliary dilatation. Pancreas: Unremarkable. No pancreatic ductal dilatation or surrounding inflammatory changes. Spleen: Normal in size without focal abnormality. Adrenals/Urinary Tract: No adrenal gland nodules. Bilateral renal atrophy. No hydronephrosis or hydroureter. Multiple exophytic cysts on the left  kidney consistent with benign cysts. Bladder wall is not thickened and no filling defects demonstrated in the bladder. Stomach/Bowel: Stomach, small bowel, and colon are not abnormally distended. No wall thickening or inflammatory infiltration noted. Contrast material flows through to the colon without evidence of obstruction. The appendix is normal. Vascular/Lymphatic: Aortic atherosclerosis. No enlarged abdominal or pelvic lymph nodes. Inferior vena cava is flattened suggesting hypovolemia. Reproductive: Prostate gland is enlarged, measuring 4.6 cm diameter. Other: No abdominal wall hernia or abnormality. No abdominopelvic ascites. Musculoskeletal: No acute or significant osseous findings. IMPRESSION: 1. No evidence of bowel obstruction or inflammation. 2. Cardiac enlargement. 3. Benign-appearing left renal cysts. 4. Aortic atherosclerosis. 5. Enlarged prostate gland. 6.  Flattened IVC suggesting hypovolemia. Electronically Signed   By: Lucienne Capers M.D.   On: 02/21/2017 05:39      Procedures   ____________________________________________   INITIAL IMPRESSION / ASSESSMENT AND PLAN / ED COURSE  As part of my medical decision making, I reviewed the following data within the electronic MEDICAL RECORD [id number] year old male presenting with above-stated history and physical exam secondary to diarrhea and vomiting.  Consider the possibility of infectious diarrhea with concern for possible C. difficile given recent hospital admission and antibiotic use as such C. difficile and stool cultures ordered and pending as patient has not had a bowel movement while in the emergency department.  Laboratory data notable for hyponatremia with a sodium of 130 renal insufficiency with a BUN of 29 creatinine of 1.83 which is increased since the patient's last admission patient's current GFR 34.  In addition's patient's initial troponin 0 0.04 with a white blood cell count of 15.4.  Patient discussed with Dr. Marcille Blanco for  hospital admission for further evaluation and management of acute renal insufficiency pending stool samples at this time ____________________________________________  FINAL CLINICAL IMPRESSION(S) / ED DIAGNOSES  Final diagnoses:  Acute renal insufficiency  Diarrhea of presumed infectious origin     MEDICATIONS GIVEN DURING THIS VISIT:  Medications  heparin injection 5,000 Units (5,000 Units Subcutaneous Given 02/21/17 0626)  0.9 %  sodium chloride infusion ( Intravenous New Bag/Given 02/21/17 0425)  acetaminophen (TYLENOL) tablet 650 mg (not administered)    Or  acetaminophen (TYLENOL) suppository 650 mg (not administered)  ondansetron (ZOFRAN) tablet 4 mg (not administered)    Or  ondansetron (ZOFRAN) injection 4 mg (not administered)  docusate sodium (COLACE) capsule 100 mg (not administered)  DULoxetine (CYMBALTA) DR capsule 60 mg (not administered)  pantoprazole (PROTONIX) EC tablet 40 mg (not administered)  omega-3 acid ethyl esters (LOVAZA) capsule 2 g (not administered)  benazepril (LOTENSIN) tablet 10 mg (not administered)  furosemide (LASIX) tablet 20 mg (not administered)  donepezil (ARICEPT) tablet 5 mg (not administered)  levothyroxine (SYNTHROID, LEVOTHROID) tablet 112 mcg (not administered)  aspirin EC tablet 81 mg (not administered)  ipratropium-albuterol (DUONEB) 0.5-2.5 (3) MG/3ML nebulizer solution 3 mL (not administered)  ferrous sulfate tablet 325 mg (not administered)  Melatonin TABS 2.5 mg (not administered)  mometasone-formoterol (DULERA) 200-5 MCG/ACT inhaler 2 puff (not administered)  digoxin (LANOXIN) tablet 0.125 mg (not administered)  metoprolol tartrate (LOPRESSOR) tablet 100 mg (not administered)  insulin aspart (novoLOG) injection 0-9 Units (not administered)  sodium chloride 0.9 % bolus 250 mL (0 mLs Intravenous Stopped 02/21/17 0350)  aspirin chewable tablet 324 mg (324 mg Oral Given 02/21/17 0245)  iopamidol (ISOVUE-300) 61 % injection 15 mL (15  mLs Oral Contrast Given 02/21/17 0405)     ED Discharge Orders    None       Note:  This document was prepared using Dragon voice recognition software and may include unintentional dictation errors.    Gregor Hams, MD 02/21/17 [phone]

## 2017-02-21 NOTE — ED Notes (Signed)
Pt taken to floor via stretcher. VSS. NAD. Reports called to [place]. All questions answered.

## 2017-02-21 NOTE — ED Notes (Signed)
UP TO BATHROOM TO HAVE ABOUT 75 ML LIQUID BROWN STOOL.  SAMPLE TO LAB.

## 2017-02-21 NOTE — Progress Notes (Signed)
The patient is admitted for acute renal failure due to dehydration secondary to diarrhea this morning. He is demented. Vital sign is reviewed.  Physical examination is unremarkable. Continue IV fluid support and follow-up BMP in the morning. Continue other current treatment.  Discussed with the patient's son, cardiology Dr. Humphrey Rolls and the nurse.  Time spent about 32 minutes.

## 2017-02-21 NOTE — Consult Note (Signed)
Derek Shelton is a 77 y.o. male  [id number]  Primary Cardiologist: Neoma Laming Reason for Consultation: SOB/CHF  HPI: 65 YM came with SOB and diarroea and vomiting   Review of Systems: Occasional cp   Past Medical History:  Diagnosis Date  . Anemia   . Asthma   . CHF (congestive heart failure) ([place])   . COPD (chronic obstructive pulmonary disease) ([place])   . Coronary artery disease   . Dementia    Per son's report  . Diabetes mellitus without complication ([place])   . Dysrhythmia    Atrial Fibrillation  . GERD (gastroesophageal reflux disease)   . Hyperlipidemia   . Hypertension   . Hypothyroidism   . Shortness of breath dyspnea   . Sleep apnea   . Thyroid disease      (Not in a hospital admission)   . aspirin EC  81 mg Oral Daily  . benazepril  10 mg Oral Daily  . digoxin  0.125 mg Oral Daily  . docusate sodium  100 mg Oral BID  . donepezil  5 mg Oral QHS  . DULoxetine  60 mg Oral Daily  . ferrous sulfate  325 mg Oral Daily  . furosemide  20 mg Oral BID  . heparin  5,000 Units Subcutaneous Q8H  . insulin aspart  0-9 Units Subcutaneous TID WC  . levothyroxine  112 mcg Oral QAC breakfast  . metoprolol tartrate  100 mg Oral BID  . mometasone-formoterol  2 puff Inhalation BID  . omega-3 acid ethyl esters  2 g Oral BID  . pantoprazole  40 mg Oral Daily    Infusions: . sodium chloride 125 mL/hr at 02/21/17 0425    Allergies  Allergen Reactions  . Catapres [Clonidine Hcl]   . Coreg [Carvedilol]   . Penicillins     Social History   Socioeconomic History  . Marital status: Married    Spouse name: Not on file  . Number of children: Not on file  . Years of education: Not on file  . Highest education level: Not on file  Social Needs  . Financial resource strain: Not on file  . Food insecurity - worry: Not on file  . Food insecurity - inability: Not on file  . Transportation needs - medical: Not on file  . Transportation needs - non-medical: Not  on file  Occupational History  . Not on file  Tobacco Use  . Smoking status: Former Research scientist (life sciences)  . Smokeless tobacco: Current User    Types: Chew  Substance and Sexual Activity  . Alcohol use: No  . Drug use: No  . Sexual activity: Not on file  Other Topics Concern  . Not on file  Social History Narrative  . Not on file    Family History  Problem Relation Age of Onset  . Diabetes Mother     PHYSICAL EXAM: Vitals:   02/21/17 0730 02/21/17 0830  BP: 108/74 129/90  Pulse: 77 87  Resp: 20 17  Temp:    SpO2: 95% 96%    No intake or output data in the 24 hours ending 02/21/17 0850  General:  Well appearing. No respiratory difficulty HEENT: normal Neck: supple. no JVD. Carotids 2+ bilat; no bruits. No lymphadenopathy or thryomegaly appreciated. Cor: PMI nondisplaced. Regular rate & rhythm. No rubs, gallops or murmurs. Lungs: clear Abdomen: soft, nontender, nondistended. No hepatosplenomegaly. No bruits or masses. Good bowel sounds. Extremities: no cyanosis, clubbing, rash, edema Neuro: alert & oriented x  3, cranial nerves grossly intact. moves all 4 extremities w/o difficulty. Affect pleasant.  ECG: Afib with VR 110  Results for orders placed or performed during the hospital encounter of 02/21/17 (from the past 24 hour(s))  Lipase, blood     Status: Abnormal   Collection Time: 02/21/17  1:05 AM  Result Value Ref Range   Lipase 54 (H) 11 - 51 U/L  Comprehensive metabolic panel     Status: Abnormal   Collection Time: 02/21/17  1:05 AM  Result Value Ref Range   Sodium 130 (L) 135 - 145 mmol/L   Potassium 4.3 3.5 - 5.1 mmol/L   Chloride 96 (L) 101 - 111 mmol/L   CO2 23 22 - 32 mmol/L   Glucose, Bld 160 (H) 65 - 99 mg/dL   BUN 29 (H) 6 - 20 mg/dL   Creatinine, Ser 1.83 (H) 0.61 - 1.24 mg/dL   Calcium 9.2 8.9 - 10.3 mg/dL   Total Protein 8.3 (H) 6.5 - 8.1 g/dL   Albumin 4.2 3.5 - 5.0 g/dL   AST 34 15 - 41 U/L   ALT 27 17 - 63 U/L   Alkaline Phosphatase 69 38 - 126 U/L    Total Bilirubin 1.1 0.3 - 1.2 mg/dL   GFR calc non Af Amer 34 (L) >60 mL/min   GFR calc Af Amer 39 (L) >60 mL/min   Anion gap 11 5 - 15  CBC     Status: Abnormal   Collection Time: 02/21/17  1:05 AM  Result Value Ref Range   WBC 15.4 (H) 3.8 - 10.6 K/uL   RBC 5.82 4.40 - 5.90 MIL/uL   Hemoglobin 16.4 13.0 - 18.0 g/dL   HCT 50.9 40.0 - 52.0 %   MCV 87.4 80.0 - 100.0 fL   MCH 28.2 26.0 - 34.0 pg   MCHC 32.3 32.0 - 36.0 g/dL   RDW 14.3 11.5 - 14.5 %   Platelets 248 150 - 440 K/uL  Troponin I     Status: Abnormal   Collection Time: 02/21/17  1:05 AM  Result Value Ref Range   Troponin I 0.04 (HH) <0.03 ng/mL  TSH     Status: None   Collection Time: 02/21/17  4:29 AM  Result Value Ref Range   TSH 1.695 0.350 - 4.500 uIU/mL  Troponin I     Status: Abnormal   Collection Time: 02/21/17  4:29 AM  Result Value Ref Range   Troponin I 0.03 (HH) <0.03 ng/mL  Glucose, capillary     Status: None   Collection Time: 02/21/17  8:27 AM  Result Value Ref Range   Glucose-Capillary 90 65 - 99 mg/dL   Ct Abdomen Pelvis Wo Contrast  Result Date: 02/21/2017 CLINICAL DATA:  Diarrhea today and vomiting since Friday. EXAM: CT ABDOMEN AND PELVIS WITHOUT CONTRAST TECHNIQUE: Multidetector CT imaging of the abdomen and pelvis was performed following the standard protocol without IV contrast. COMPARISON:  None. FINDINGS: Unenhanced CT was performed per clinician order. Lack of IV contrast limits sensitivity and specificity, especially for evaluation of abdominal/pelvic solid viscera. Lower chest: Mild dependent changes in the lung bases. Cardiac enlargement. Small esophageal hiatal hernia. Hepatobiliary: No focal liver abnormality is seen. No gallstones, gallbladder wall thickening, or biliary dilatation. Pancreas: Unremarkable. No pancreatic ductal dilatation or surrounding inflammatory changes. Spleen: Normal in size without focal abnormality. Adrenals/Urinary Tract: No adrenal gland nodules. Bilateral renal  atrophy. No hydronephrosis or hydroureter. Multiple exophytic cysts on the left kidney consistent with benign cysts.  Bladder wall is not thickened and no filling defects demonstrated in the bladder. Stomach/Bowel: Stomach, small bowel, and colon are not abnormally distended. No wall thickening or inflammatory infiltration noted. Contrast material flows through to the colon without evidence of obstruction. The appendix is normal. Vascular/Lymphatic: Aortic atherosclerosis. No enlarged abdominal or pelvic lymph nodes. Inferior vena cava is flattened suggesting hypovolemia. Reproductive: Prostate gland is enlarged, measuring 4.6 cm diameter. Other: No abdominal wall hernia or abnormality. No abdominopelvic ascites. Musculoskeletal: No acute or significant osseous findings. IMPRESSION: 1. No evidence of bowel obstruction or inflammation. 2. Cardiac enlargement. 3. Benign-appearing left renal cysts. 4. Aortic atherosclerosis. 5. Enlarged prostate gland. 6. Flattened IVC suggesting hypovolemia. Electronically Signed   By: Lucienne Capers M.D.   On: 02/21/2017 05:39     ASSESSMENT AND PLAN: R/O digoxin toxicity since BUN and creat went up to 1.8 and 29, may be dehydrated and may need to hold digoxin.  KHAN,SHAUKAT A

## 2017-02-21 NOTE — ED Notes (Addendum)
Pt able to drink one bottle of contrast, states he can not drink the second, Dr. Marcille Blanco made aware, states pt can go to CT scan at this time, CT notified. Pt denies having to void or have BM at this time.

## 2017-02-21 NOTE — H&P (Signed)
Derek Shelton is an 77 y.o. male.   Chief Complaint: Chest pain HPI: The patient with past medical history of atrial fibrillation, CHF, CAD hypertension, hypothyroidism and COPD presents to the emergency department complaining of chest pain.  The patient's pain began sometime after multiple episodes of diarrhea.  The patient stool is been non-bloody.  He denies shortness of breath.  The patient denies palpitations or lightheadedness.  Laboratory evaluation revealed acute kidney injury which in addition to his aforementioned symptoms prompted the emergency department to call the hospitalist service for admission.  Past Medical History:  Diagnosis Date  . Anemia   . Asthma   . CHF (congestive heart failure) ([place])   . COPD (chronic obstructive pulmonary disease) ([place])   . Coronary artery disease   . Dementia    Per son's report  . Diabetes mellitus without complication ([place])   . Dysrhythmia    Atrial Fibrillation  . GERD (gastroesophageal reflux disease)   . Hyperlipidemia   . Hypertension   . Hypothyroidism   . Shortness of breath dyspnea   . Sleep apnea   . Thyroid disease     Past Surgical History:  Procedure Laterality Date  . CARDIAC CATHETERIZATION    . COLONOSCOPY N/A 08/10/2014   Procedure: COLONOSCOPY;  Surgeon: Manya Silvas, MD;  Location[facility] ENDOSCOPY;  Service: Endoscopy;  Laterality: N/A;  . COLONOSCOPY WITH PROPOFOL N/A 04/05/2016   Procedure: COLONOSCOPY WITH PROPOFOL;  Surgeon: Lollie Sails, MD;  Location: [facility] ENDOSCOPY;  Service: Endoscopy;  Laterality: N/A;  . ESOPHAGOGASTRODUODENOSCOPY N/A 08/08/2014   Procedure: ESOPHAGOGASTRODUODENOSCOPY (EGD);  Surgeon: Manya Silvas, MD;  Location: [facility] ENDOSCOPY;  Service: Endoscopy;  Laterality: N/A;  plan for early afternoon  . EYE SURGERY    . GIVENS CAPSULE STUDY N/A 08/12/2014   Procedure: GIVENS CAPSULE STUDY;  Surgeon: Manya Silvas, MD;  Location: [facility] ENDOSCOPY;  Service: Endoscopy;  Laterality: N/A;   . rectal fistula N/A     Family History  Problem Relation Age of Onset  . Diabetes Mother    Social History:  reports that he has quit smoking. His smokeless tobacco use includes chew. He reports that he does not drink alcohol or use drugs.  Allergies:  Allergies  Allergen Reactions  . Catapres [Clonidine Hcl]   . Coreg [Carvedilol]   . Penicillins      (Not in a hospital admission)  Results for orders placed or performed during the hospital encounter of 02/21/17 (from the past 48 hour(s))  Lipase, blood     Status: Abnormal   Collection Time: 02/21/17  1:05 AM  Result Value Ref Range   Lipase 54 (H) 11 - 51 U/L    Comment: Performed at [facility], [address] [place], [place] [address]  Comprehensive metabolic panel     Status: Abnormal   Collection Time: 02/21/17  1:05 AM  Result Value Ref Range   Sodium 130 (L) 135 - 145 mmol/L   Potassium 4.3 3.5 - 5.1 mmol/L   Chloride 96 (L) 101 - 111 mmol/L   CO2 23 22 - 32 mmol/L   Glucose, Bld 160 (H) 65 - 99 mg/dL   BUN 29 (H) 6 - 20 mg/dL   Creatinine, Ser 1.83 (H) 0.61 - 1.24 mg/dL   Calcium 9.2 8.9 - 10.3 mg/dL   Total Protein 8.3 (H) 6.5 - 8.1 g/dL   Albumin 4.2 3.5 - 5.0 g/dL   AST 34 15 - 41 U/L   ALT  27 17 - 63 U/L   Alkaline Phosphatase 69 38 - 126 U/L   Total Bilirubin 1.1 0.3 - 1.2 mg/dL   GFR calc non Af Amer 34 (L) >60 mL/min   GFR calc Af Amer 39 (L) >60 mL/min    Comment: (NOTE) The eGFR has been calculated using the CKD EPI equation. This calculation has not been validated in all clinical situations. eGFR's persistently <60 mL/min signify possible Chronic Kidney Disease.    Anion gap 11 5 - 15    Comment: Performed at [facility], [address] [place], [place] [address]  CBC     Status: Abnormal   Collection Time: 02/21/17  1:05 AM  Result Value Ref Range   WBC 15.4 (H) 3.8 - 10.6 K/uL   RBC 5.82 4.40 - 5.90 MIL/uL   Hemoglobin 16.4 13.0 - 18.0 g/dL   HCT 50.9 40.0 -  52.0 %   MCV 87.4 80.0 - 100.0 fL   MCH 28.2 26.0 - 34.0 pg   MCHC 32.3 32.0 - 36.0 g/dL   RDW 14.3 11.5 - 14.5 %   Platelets 248 150 - 440 K/uL    Comment: Performed at [facility], [address] [place], [place] [address]  Troponin I     Status: Abnormal   Collection Time: 02/21/17  1:05 AM  Result Value Ref Range   Troponin I 0.04 (HH) <0.03 ng/mL    Comment: CRITICAL RESULT CALLED TO, READ BACK BY AND VERIFIED WITH DEIJA SCOTT ON 02/21/17 AT [place] Performed at [facility], [address] [place], [place] [address]   TSH     Status: None   Collection Time: 02/21/17  4:29 AM  Result Value Ref Range   TSH 1.695 0.350 - 4.500 uIU/mL    Comment: Performed by a 3rd Generation assay with a functional sensitivity of <=0.01 uIU/mL. Performed at [facility], [address] [place], [place] [address]   Troponin I     Status: Abnormal   Collection Time: 02/21/17  4:29 AM  Result Value Ref Range   Troponin I 0.03 (HH) <0.03 ng/mL    Comment: CRITICAL VALUE NOTED. VALUE IS CONSISTENT WITH PREVIOUSLY REPORTED/CALLED VALUE. JAG Performed at [facility], [address] [place], [place] [address]    Ct Abdomen Pelvis Wo Contrast  Result Date: 02/21/2017 CLINICAL DATA:  Diarrhea today and vomiting since Friday. EXAM: CT ABDOMEN AND PELVIS WITHOUT CONTRAST TECHNIQUE: Multidetector CT imaging of the abdomen and pelvis was performed following the standard protocol without IV contrast. COMPARISON:  None. FINDINGS: Unenhanced CT was performed per clinician order. Lack of IV contrast limits sensitivity and specificity, especially for evaluation of abdominal/pelvic solid viscera. Lower chest: Mild dependent changes in the lung bases. Cardiac enlargement. Small esophageal hiatal hernia. Hepatobiliary: No focal liver abnormality is seen. No gallstones, gallbladder wall thickening, or biliary dilatation. Pancreas: Unremarkable. No pancreatic ductal  dilatation or surrounding inflammatory changes. Spleen: Normal in size without focal abnormality. Adrenals/Urinary Tract: No adrenal gland nodules. Bilateral renal atrophy. No hydronephrosis or hydroureter. Multiple exophytic cysts on the left kidney consistent with benign cysts. Bladder wall is not thickened and no filling defects demonstrated in the bladder. Stomach/Bowel: Stomach, small bowel, and colon are not abnormally distended. No wall thickening or inflammatory infiltration noted. Contrast material flows through to the colon without evidence of obstruction. The appendix is normal. Vascular/Lymphatic: Aortic atherosclerosis. No enlarged abdominal or pelvic lymph nodes. Inferior vena cava is flattened suggesting hypovolemia.  Reproductive: Prostate gland is enlarged, measuring 4.6 cm diameter. Other: No abdominal wall hernia or abnormality. No abdominopelvic ascites. Musculoskeletal: No acute or significant osseous findings. IMPRESSION: 1. No evidence of bowel obstruction or inflammation. 2. Cardiac enlargement. 3. Benign-appearing left renal cysts. 4. Aortic atherosclerosis. 5. Enlarged prostate gland. 6. Flattened IVC suggesting hypovolemia. Electronically Signed   By: Lucienne Capers M.D.   On: 02/21/2017 05:39    Review of Systems  Constitutional: Negative for chills and fever.  HENT: Negative for sore throat and tinnitus.   Eyes: Negative for blurred vision and redness.  Respiratory: Negative for cough and shortness of breath.   Cardiovascular: Negative for chest pain, palpitations, orthopnea and PND.  Gastrointestinal: Negative for abdominal pain, diarrhea, nausea and vomiting.  Genitourinary: Negative for dysuria, frequency and urgency.  Musculoskeletal: Negative for joint pain and myalgias.  Skin: Negative for rash.       No lesions  Neurological: Negative for speech change, focal weakness and weakness.  Endo/Heme/Allergies: Does not bruise/bleed easily.       No temperature  intolerance  Psychiatric/Behavioral: Negative for depression and suicidal ideas.    Blood pressure 107/89, pulse 66, temperature 97.7 F (36.5 C), temperature source Oral, resp. rate 17, height '5\' 5"'$  (1.651 m), weight 96.2 kg (212 lb), SpO2 97 %. Physical Exam  Constitutional: He is oriented to person, place, and time. He appears well-developed and well-nourished. No distress.  HENT:  Head: Normocephalic and atraumatic.  Mouth/Throat: Oropharynx is clear and moist.  Eyes: Conjunctivae and EOM are normal. Pupils are equal, round, and reactive to light. No scleral icterus.  Neck: Normal range of motion. Neck supple. No JVD present. No tracheal deviation present. No thyromegaly present.  Cardiovascular: Normal rate, regular rhythm and normal heart sounds. Exam reveals no gallop and no friction rub.  No murmur heard. Respiratory: Effort normal and breath sounds normal. No respiratory distress.  GI: Soft. Bowel sounds are normal. He exhibits no distension. There is no tenderness.  Genitourinary:  Genitourinary Comments: Deferred  Musculoskeletal: Normal range of motion. He exhibits no edema.  Lymphadenopathy:    He has no cervical adenopathy.  Neurological: He is alert and oriented to person, place, and time. No cranial nerve deficit.  Skin: Skin is warm and dry. No rash noted. No erythema.  Psychiatric: He has a normal mood and affect. His behavior is normal. Judgment and thought content normal.     Assessment/Plan This is a 77 year old male admitted for acute kidney injury. 1.  Acute kidney injury: Secondary to dehydration due to diarrhea.  Hydrate with intravenous fluids.  Encourage p.o. intake. 2.  Chest pain: Resolved; follow cardiac biomarkers.  Troponin is mildly elevated.  Consult cardiology.  Monitor telemetry. 3.  Hypertension: Controlled; continue metoprolol and benazepril. 4.  Diarrhea: Rule out C. difficile; check GI panel. 5.  Atrial fibrillation: Rate controlled; continue  digoxin and aspirin. 6.  Diabetes mellitus type 2: Hold oral hypoglycemic agents.  Sliding scale insulin while hospitalized 7.  Hypothyroidism: Continue Synthroid; check TSH. 8.  Dementia: Continue Aricept 9.  DVT prophylaxis: Heparin 10.  GI prophylaxis: Pantoprazole per home regimen The patient is a full code.  Time spent on admission orders and patient care proximally 45 minutes  Harrie Foreman, MD 02/21/2017, 6:39 AM

## 2017-02-21 NOTE — ED Notes (Signed)
Patient transported to CT 

## 2017-02-21 NOTE — Plan of Care (Signed)
Patient admitted to unit. Oriented to room, call bell, and staff. Bed in lowest position. Fall safety plan reviewed. Full assessment to Epic. Skin assessment verified with Georga Hacking, RN. Telemetry box verification with tele clerk- Box#: [id number]. Will continue to monitor.  Progressing Health Behavior/Discharge Planning: Ability to manage health-related needs will improve 02/21/2017 1518 - Progressing by Rolley Sims, RN Clinical Measurements: Will remain free from infection 02/21/2017 1518 - Progressing by Rolley Sims, RN Nutrition: Adequate nutrition will be maintained 02/21/2017 1518 - Progressing by Rolley Sims, RN Safety: Ability to remain free from injury will improve 02/21/2017 1518 - Progressing by Rolley Sims, RN

## 2017-02-21 NOTE — ED Triage Notes (Signed)
Pt presents to ED with diarrhea (5X today) and vomiting (1X today) since Friday. Family concerned that pt has c-diff. Denies abd pain.

## 2017-02-21 NOTE — ED Notes (Signed)
Family informs registration pt c/o CP now; pt taken to room 12 by EDT, Carney Harder to be placed on card monitor for EKG and further evaluation

## 2017-02-22 DIAGNOSIS — N179 Acute kidney failure, unspecified: Secondary | ICD-10-CM | POA: Diagnosis not present

## 2017-02-22 LAB — ECHOCARDIOGRAM COMPLETE
Height: 65 in
WEIGHTICAEL: 3392 [oz_av]

## 2017-02-22 LAB — BASIC METABOLIC PANEL
Anion gap: 8 (ref 5–15)
BUN: 25 mg/dL — ABNORMAL HIGH (ref 6–20)
CALCIUM: 8 mg/dL — AB (ref 8.9–10.3)
CO2: 21 mmol/L — ABNORMAL LOW (ref 22–32)
CREATININE: 1.44 mg/dL — AB (ref 0.61–1.24)
Chloride: 105 mmol/L (ref 101–111)
GFR, EST AFRICAN AMERICAN: 52 mL/min — AB (ref >60)
GFR, EST NON AFRICAN AMERICAN: 45 mL/min — AB (ref >60)
Glucose, Bld: 108 mg/dL — ABNORMAL HIGH (ref 65–99)
Potassium: 3.7 mmol/L (ref 3.5–5.1)
SODIUM: 134 mmol/L — AB (ref 135–145)

## 2017-02-22 LAB — CBC
HCT: 43.9 % (ref 40.0–52.0)
Hemoglobin: 14.4 g/dL (ref 13.0–18.0)
MCH: 28.5 pg (ref 26.0–34.0)
MCHC: 32.8 g/dL (ref 32.0–36.0)
MCV: 86.8 fL (ref 80.0–100.0)
PLATELETS: 210 10*3/uL (ref 150–440)
RBC: 5.06 MIL/uL (ref 4.40–5.90)
RDW: 14.3 % (ref 11.5–14.5)
WBC: 7.6 10*3/uL (ref 3.8–10.6)

## 2017-02-22 LAB — GLUCOSE, CAPILLARY
Glucose-Capillary: 131 mg/dL — ABNORMAL HIGH (ref 65–99)
Glucose-Capillary: 126 mg/dL — ABNORMAL HIGH (ref 65–99)
Glucose-Capillary: 155 mg/dL — ABNORMAL HIGH (ref 65–99)

## 2017-02-22 MED ORDER — LOPERAMIDE HCL 2 MG PO TABS: 2.0000 mg | ORAL_TABLET | Freq: Four times a day (QID) | ORAL | 0 refills | Status: DC | PRN

## 2017-02-22 NOTE — Plan of Care (Signed)
  Progressing Education: Knowledge of General Education information will improve 02/22/2017 1547 - Progressing by Rolley Sims, RN Health Behavior/Discharge Planning: Ability to manage health-related needs will improve 02/22/2017 1547 - Progressing by Rolley Sims, RN Clinical Measurements: Will remain free from infection 02/22/2017 1547 - Progressing by Rolley Sims, RN Nutrition: Adequate nutrition will be maintained 02/22/2017 1547 - Progressing by Rolley Sims, RN

## 2017-02-22 NOTE — Care Management Important Message (Signed)
Important Message  Patient Details  Name: Derek Shelton MRN: [id number] Date of Birth: [birth year]   Medicare Important Message Given:  N/A - LOS <3 / Initial given by admissions    Katrina Stack, RN 02/22/2017, 5:43 PM

## 2017-02-22 NOTE — Progress Notes (Signed)
Notified by CCMD that patient had 3 sec pause, patient asymptomatic, Dr. Darvin Neighbours on the floor and made aware. Will continue to monitor.

## 2017-02-22 NOTE — Progress Notes (Signed)
Rexene Edison to be D/C'd Home  per MD order.  Discussed prescriptions and follow up appointments with the patient and son. Prescriptions were e-prescribed. medication list explained in detail. Pt verbalized understanding.  Allergies as of 02/22/2017      Reactions   Catapres [clonidine Hcl]    Coreg [carvedilol]    Penicillins       Medication List    TAKE these medications   aspirin EC 81 MG tablet Take 81 mg by mouth daily.   benazepril 10 MG tablet Commonly known as:  LOTENSIN Take 10 mg by mouth daily.   budesonide-formoterol 160-4.5 MCG/ACT inhaler Commonly known as:  SYMBICORT Inhale 2 puffs into the lungs 2 (two) times daily.   digoxin 0.125 MG tablet Commonly known as:  LANOXIN Take 1 tablet (0.125 mg total) by mouth daily.   donepezil 5 MG tablet Commonly known as:  ARICEPT Take 5 mg by mouth at bedtime.   DULoxetine 60 MG capsule Commonly known as:  CYMBALTA Take 60 mg by mouth daily.   esomeprazole 40 MG capsule Commonly known as:  NEXIUM Take 40 mg by mouth daily at 12 noon.   ferrous sulfate 325 (65 FE) MG EC tablet Take 325 mg by mouth daily.   furosemide 20 MG tablet Commonly known as:  LASIX Take 20 mg by mouth 2 (two) times daily.   glimepiride 1 MG tablet Commonly known as:  AMARYL Take 1 tablet (1 mg total) by mouth daily with breakfast.   ipratropium-albuterol 0.5-2.5 (3) MG/3ML Soln Commonly known as:  DUONEB Inhale 3 mLs into the lungs every 6 (six) hours as needed for shortness of breath.   levothyroxine 112 MCG tablet Commonly known as:  SYNTHROID, LEVOTHROID Take 112 mcg by mouth daily before breakfast.   loperamide 2 MG tablet Commonly known as:  IMODIUM A-D Take 1 tablet (2 mg total) by mouth 4 (four) times daily as needed for diarrhea or loose stools.   Melatonin 3 MG Tabs Take 3 mg by mouth at bedtime as needed.   metoprolol tartrate 100 MG tablet Commonly known as:  LOPRESSOR Take 1 tablet (100 mg total) by mouth 2  (two) times daily.   omega-3 acid ethyl esters 1 g capsule Commonly known as:  LOVAZA Take 2 g by mouth 2 (two) times daily.       Vitals:   02/22/17 0925 02/22/17 1708  BP: 102/76 135/74  Pulse: 96 78  Resp:  18  Temp:    SpO2: 99% 99%    Tele box removed and returned. Skin clean, dry and intact without evidence of skin break down, no evidence of skin tears noted. IV catheter discontinued intact. Site without signs and symptoms of complications. Dressing and pressure applied. Pt denies pain at this time. No complaints noted.  An After Visit Summary was printed and given to the patient. Patient escorted via [place], and D/C home via private auto.  Rolley Sims

## 2017-02-22 NOTE — Progress Notes (Signed)
SUBJECTIVE: Pt reports his chest pain and shortness of breath is better.   Vitals:   02/21/17 1915 02/21/17 2155 02/22/17 0339 02/22/17 0712  BP: (!) 104/54 110/74 105/63 105/67  Pulse: 97 64 79 80  Resp: 18  18 18   Temp: 97.8 F (36.6 C)  98.2 F (36.8 C) 98.1 F (36.7 C)  TempSrc: Oral     SpO2: 98%  97% 99%  Weight:   199 lb 8 oz (90.5 kg)   Height:        Intake/Output Summary (Last 24 hours) at 02/22/2017 0836 Last data filed at 02/22/2017 0700 Gross per 24 hour  Intake 1085.42 ml  Output 800 ml  Net 285.42 ml    LABS: Basic Metabolic Panel: Recent Labs    02/21/17 0105 02/22/17 0257  NA 130* 134*  K 4.3 3.7  CL 96* 105  CO2 23 21*  GLUCOSE 160* 108*  BUN 29* 25*  CREATININE 1.83* 1.44*  CALCIUM 9.2 8.0*   Liver Function Tests: Recent Labs    02/21/17 0105  AST 34  ALT 27  ALKPHOS 69  BILITOT 1.1  PROT 8.3*  ALBUMIN 4.2   Recent Labs    02/21/17 0105  LIPASE 54*   CBC: Recent Labs    02/21/17 0105 02/22/17 0257  WBC 15.4* 7.6  HGB 16.4 14.4  HCT 50.9 43.9  MCV 87.4 86.8  PLT 248 210   Cardiac Enzymes: Recent Labs    02/21/17 0105 02/21/17 0429  TROPONINI 0.04* 0.03*   BNP: Invalid input(s): POCBNP D-Dimer: No results for input(s): DDIMER in the last 72 hours. Hemoglobin A1C: No results for input(s): HGBA1C in the last 72 hours. Fasting Lipid Panel: No results for input(s): CHOL, HDL, LDLCALC, TRIG, CHOLHDL, LDLDIRECT in the last 72 hours. Thyroid Function Tests: Recent Labs    02/21/17 0429  TSH 1.695   Anemia Panel: No results for input(s): VITAMINB12, FOLATE, FERRITIN, TIBC, IRON, RETICCTPCT in the last 72 hours.   PHYSICAL EXAM General: Well developed, well nourished, in no acute distress HEENT:  Normocephalic and atramatic Neck:  No JVD.  Lungs: Clear bilaterally to auscultation and percussion. Heart: HRRR . Normal S1 and S2 without gallops or murmurs.  Abdomen: Bowel sounds are positive, abdomen soft and  non-tender  Msk:  Back normal, normal gait. Normal strength and tone for age. Extremities: No clubbing, cyanosis or edema.   Neuro: Mild confusion, difficult to assess due to very limited English Psych:  Good affect, responds appropriately  TELEMETRY: Afib 71bpm  ASSESSMENT AND PLAN: Pt is feeling better, no chest pain or shortness of breath. Creatinine appears to have returned to baseline. Atrial fibrillation is rate controlled at this time. Digoxin level is WNL. Advise continuing digoxin, metoprolol, and lasix. Cardiac status is improving.   Active Problems:   Chest pain   AKI (acute kidney injury) ([place])    Derek Bathe, NP-C 02/22/2017 8:36 AM Cell: [phone]

## 2017-02-23 NOTE — Discharge Summary (Signed)
[facility] [place] NAME: Derek Shelton    MR#:  [id number]  DATE OF BIRTH:  [birth year]  DATE OF ADMISSION:  02/21/2017 ADMITTING PHYSICIAN: Harrie Foreman, MD  DATE OF DISCHARGE: 02/22/2017  6:06 PM  PRIMARY CARE PHYSICIAN: Perrin Maltese, MD   ADMISSION DIAGNOSIS:  Diarrhea of presumed infectious origin [R19.7] Acute renal insufficiency [N28.9] AKI (acute kidney injury) ([place]) [N17.9]  DISCHARGE DIAGNOSIS:  Active Problems:   Chest pain   AKI (acute kidney injury) ([place])   SECONDARY DIAGNOSIS:   Past Medical History:  Diagnosis Date  . Anemia   . Asthma   . CHF (congestive heart failure) ([place])   . COPD (chronic obstructive pulmonary disease) ([place])   . Coronary artery disease   . Dementia    Per son's report  . Diabetes mellitus without complication ([place])   . Dysrhythmia    Atrial Fibrillation  . GERD (gastroesophageal reflux disease)   . Hyperlipidemia   . Hypertension   . Hypothyroidism   . Shortness of breath dyspnea   . Sleep apnea   . Thyroid disease      ADMITTING HISTORY  Chief Complaint: Chest pain HPI: The patient with past medical history of atrial fibrillation, CHF, CAD hypertension, hypothyroidism and COPD presents to the emergency department complaining of chest pain.  The patient's pain began sometime after multiple episodes of diarrhea.  The patient stool is been non-bloody.  He denies shortness of breath.  The patient denies palpitations or lightheadedness.  Laboratory evaluation revealed acute kidney injury which in addition to his aforementioned symptoms prompted the emergency department to call the hospitalist service for admission.   HOSPITAL COURSE:   *Diarrhea. *Dehydration. *Acute kidney injury over CKD stage III *Possible chest pain *Atrial fibrillation *Dementia  Patient presented to the emergency room with possible chest pain.  Patient is poor historian due to dementia.  His troponin was  normal.  EKG showed nothing acute.  But patient also had diarrhea and was found to have acute kidney injury/weakness.  Admitted to the hospital on IV fluids.  This was quickly stopped due to hisere with history of congestive heart failure.  Creatinine improved.  Diarrhea resolved.  C. difficile negative stool PCR negative.  Patient was pleasantly confused in the hospital.  Followed by Dr. Yancey Flemings of cardiology.  Digoxin levels were normal.  Patient with his diarrhea resolved and creatinine trending down is being discharged home in stable condition to follow-up with his primary care physician and Dr. Yancey Flemings.  CONSULTS OBTAINED:  Treatment Team:  Dionisio David, MD  DRUG ALLERGIES:   Allergies  Allergen Reactions  . Catapres [Clonidine Hcl]   . Coreg [Carvedilol]   . Penicillins     DISCHARGE MEDICATIONS:   Allergies as of 02/22/2017      Reactions   Catapres [clonidine Hcl]    Coreg [carvedilol]    Penicillins       Medication List    TAKE these medications   aspirin EC 81 MG tablet Take 81 mg by mouth daily.   benazepril 10 MG tablet Commonly known as:  LOTENSIN Take 10 mg by mouth daily.   budesonide-formoterol 160-4.5 MCG/ACT inhaler Commonly known as:  SYMBICORT Inhale 2 puffs into the lungs 2 (two) times daily.   digoxin 0.125 MG tablet Commonly known as:  LANOXIN Take 1 tablet (0.125 mg total) by mouth daily.   donepezil 5 MG tablet Commonly known as:  ARICEPT Take 5 mg  by mouth at bedtime.   DULoxetine 60 MG capsule Commonly known as:  CYMBALTA Take 60 mg by mouth daily.   esomeprazole 40 MG capsule Commonly known as:  NEXIUM Take 40 mg by mouth daily at 12 noon.   ferrous sulfate 325 (65 FE) MG EC tablet Take 325 mg by mouth daily.   furosemide 20 MG tablet Commonly known as:  LASIX Take 20 mg by mouth 2 (two) times daily.   glimepiride 1 MG tablet Commonly known as:  AMARYL Take 1 tablet (1 mg total) by mouth daily with breakfast.    ipratropium-albuterol 0.5-2.5 (3) MG/3ML Soln Commonly known as:  DUONEB Inhale 3 mLs into the lungs every 6 (six) hours as needed for shortness of breath.   levothyroxine 112 MCG tablet Commonly known as:  SYNTHROID, LEVOTHROID Take 112 mcg by mouth daily before breakfast.   loperamide 2 MG tablet Commonly known as:  IMODIUM A-D Take 1 tablet (2 mg total) by mouth 4 (four) times daily as needed for diarrhea or loose stools.   Melatonin 3 MG Tabs Take 3 mg by mouth at bedtime as needed.   metoprolol tartrate 100 MG tablet Commonly known as:  LOPRESSOR Take 1 tablet (100 mg total) by mouth 2 (two) times daily.   omega-3 acid ethyl esters 1 g capsule Commonly known as:  LOVAZA Take 2 g by mouth 2 (two) times daily.       Today   VITAL SIGNS:  Blood pressure 135/74, pulse 78, temperature 98.1 F (36.7 C), temperature source Oral, resp. rate 18, height 5\' 5"  (1.651 m), weight 90.5 kg (199 lb 8 oz), SpO2 99 %.  I/O:  No intake or output data in the 24 hours ending 02/23/17 1726  PHYSICAL EXAMINATION:  Physical Exam  GENERAL:  77 y.o.-year-old patient lying in the bed with no acute distress.  LUNGS: Normal breath sounds bilaterally, no wheezing, rales,rhonchi or crepitation. No use of accessory muscles of respiration.  CARDIOVASCULAR: S1, S2 normal. No murmurs, rubs, or gallops.  ABDOMEN: Soft, non-tender, non-distended. Bowel sounds present. No organomegaly or mass.  NEUROLOGIC: Moves all 4 extremities. PSYCHIATRIC: The patient is alert and oriented x 3.  SKIN: No obvious rash, lesion, or ulcer.   DATA REVIEW:   CBC Recent Labs  Lab 02/22/17 0257  WBC 7.6  HGB 14.4  HCT 43.9  PLT 210    Chemistries  Recent Labs  Lab 02/21/17 0105 02/22/17 0257  NA 130* 134*  K 4.3 3.7  CL 96* 105  CO2 23 21*  GLUCOSE 160* 108*  BUN 29* 25*  CREATININE 1.83* 1.44*  CALCIUM 9.2 8.0*  AST 34  --   ALT 27  --   ALKPHOS 69  --   BILITOT 1.1  --     Cardiac  Enzymes Recent Labs  Lab 02/21/17 0429  TROPONINI 0.03*    Microbiology Results  Results for orders placed or performed during the hospital encounter of 02/21/17  C difficile quick scan w PCR reflex     Status: None   Collection Time: 02/21/17  8:02 AM  Result Value Ref Range Status   C Diff antigen NEGATIVE NEGATIVE Final   C Diff toxin NEGATIVE NEGATIVE Final   C Diff interpretation No C. difficile detected.  Final    Comment: Performed at [facility], [address], [place], [place] [address]  Gastrointestinal Panel by PCR , Stool     Status: None   Collection Time: 02/21/17  8:02  AM  Result Value Ref Range Status   Campylobacter species NOT DETECTED NOT DETECTED Final   Plesimonas shigelloides NOT DETECTED NOT DETECTED Final   Salmonella species NOT DETECTED NOT DETECTED Final   Yersinia enterocolitica NOT DETECTED NOT DETECTED Final   Vibrio species NOT DETECTED NOT DETECTED Final   Vibrio cholerae NOT DETECTED NOT DETECTED Final   Enteroaggregative E coli (EAEC) NOT DETECTED NOT DETECTED Final   Enteropathogenic E coli (EPEC) NOT DETECTED NOT DETECTED Final   Enterotoxigenic E coli (ETEC) NOT DETECTED NOT DETECTED Final   Shiga like toxin producing E coli (STEC) NOT DETECTED NOT DETECTED Final   Shigella/Enteroinvasive E coli (EIEC) NOT DETECTED NOT DETECTED Final   Cryptosporidium NOT DETECTED NOT DETECTED Final   Cyclospora cayetanensis NOT DETECTED NOT DETECTED Final   Entamoeba histolytica NOT DETECTED NOT DETECTED Final   Giardia lamblia NOT DETECTED NOT DETECTED Final   Adenovirus F40/41 NOT DETECTED NOT DETECTED Final   Astrovirus NOT DETECTED NOT DETECTED Final   Norovirus GI/GII NOT DETECTED NOT DETECTED Final   Rotavirus A NOT DETECTED NOT DETECTED Final   Sapovirus (I, II, IV, and V) NOT DETECTED NOT DETECTED Final    Comment: Performed at [facility], [address] [place], [place] [address]    RADIOLOGY:  No results  found.  Follow up with PCP in 1 week.  Management plans discussed with the patient, family and they are in agreement.  CODE STATUS:  Code Status History    Date Active Date Inactive Code Status Order ID Comments User Context   02/21/2017 04:13 02/22/2017 21:11 Full Code [id number]  Harrie Foreman, MD ED   02/08/2017 06:15 02/11/2017 16:55 Full Code [id number]  Harrie Foreman, MD Inpatient   08/06/2014 18:21 08/12/2014 20:19 Full Code [id number]  Aldean Jewett, MD Inpatient      TOTAL TIME TAKING CARE OF THIS PATIENT ON DAY OF DISCHARGE: more than 30 minutes.   Neita Carp M.D on 02/23/2017 at 5:26 PM  Between 7am to 6pm - Pager - [phone]  After 6pm go to [email or web address] - password [email or web address] [facility]  Office  [phone]  CC: Primary care physician; Perrin Maltese, MD  Note: This dictation was prepared with Dragon dictation along with smaller phrase technology. Any transcriptional errors that result from this process are unintentional.

## 2017-04-26 ENCOUNTER — Other Ambulatory Visit: Payer: Self-pay | Admitting: Internal Medicine

## 2017-04-26 DIAGNOSIS — R1011 Right upper quadrant pain: Secondary | ICD-10-CM

## 2017-05-01 ENCOUNTER — Ambulatory Visit
Admission: RE | Admit: 2017-05-01 | Discharge: 2017-05-01 | Disposition: A | Discharge status: Home / Self Care | Payer: Medicare Other | Source: Ambulatory Visit | Attending: Internal Medicine | Admitting: Internal Medicine

## 2017-05-01 DIAGNOSIS — N281 Cyst of kidney, acquired: Secondary | ICD-10-CM | POA: Diagnosis not present

## 2017-05-01 DIAGNOSIS — R1011 Right upper quadrant pain: Secondary | ICD-10-CM | POA: Diagnosis not present

## 2017-05-22 ENCOUNTER — Emergency Department: Payer: Medicare Other

## 2017-05-22 ENCOUNTER — Encounter: Payer: Self-pay | Admitting: Emergency Medicine

## 2017-05-22 ENCOUNTER — Other Ambulatory Visit: Payer: Self-pay

## 2017-05-22 ENCOUNTER — Inpatient Hospital Stay
Admission: EM | Admit: 2017-05-22 | Discharge: 2017-05-23 | DRG: 293 | Disposition: A | Discharge status: Home / Self Care | Payer: Medicare Other | Attending: Internal Medicine | Admitting: Internal Medicine

## 2017-05-22 DIAGNOSIS — Z833 Family history of diabetes mellitus: Secondary | ICD-10-CM | POA: Diagnosis not present

## 2017-05-22 DIAGNOSIS — R0602 Shortness of breath: Secondary | ICD-10-CM | POA: Diagnosis present

## 2017-05-22 DIAGNOSIS — J449 Chronic obstructive pulmonary disease, unspecified: Secondary | ICD-10-CM | POA: Diagnosis not present

## 2017-05-22 DIAGNOSIS — E119 Type 2 diabetes mellitus without complications: Secondary | ICD-10-CM | POA: Diagnosis not present

## 2017-05-22 DIAGNOSIS — I2510 Atherosclerotic heart disease of native coronary artery without angina pectoris: Secondary | ICD-10-CM | POA: Diagnosis present

## 2017-05-22 DIAGNOSIS — G4733 Obstructive sleep apnea (adult) (pediatric): Secondary | ICD-10-CM | POA: Diagnosis present

## 2017-05-22 DIAGNOSIS — Z7982 Long term (current) use of aspirin: Secondary | ICD-10-CM | POA: Diagnosis not present

## 2017-05-22 DIAGNOSIS — Z880 Allergy status to penicillin: Secondary | ICD-10-CM

## 2017-05-22 DIAGNOSIS — E039 Hypothyroidism, unspecified: Secondary | ICD-10-CM | POA: Diagnosis present

## 2017-05-22 DIAGNOSIS — I482 Chronic atrial fibrillation, unspecified: Secondary | ICD-10-CM | POA: Diagnosis present

## 2017-05-22 DIAGNOSIS — E785 Hyperlipidemia, unspecified: Secondary | ICD-10-CM | POA: Diagnosis not present

## 2017-05-22 DIAGNOSIS — I110 Hypertensive heart disease with heart failure: Secondary | ICD-10-CM | POA: Diagnosis not present

## 2017-05-22 DIAGNOSIS — D649 Anemia, unspecified: Secondary | ICD-10-CM | POA: Diagnosis present

## 2017-05-22 DIAGNOSIS — Z888 Allergy status to other drugs, medicaments and biological substances status: Secondary | ICD-10-CM

## 2017-05-22 DIAGNOSIS — I5033 Acute on chronic diastolic (congestive) heart failure: Secondary | ICD-10-CM | POA: Diagnosis not present

## 2017-05-22 DIAGNOSIS — K219 Gastro-esophageal reflux disease without esophagitis: Secondary | ICD-10-CM | POA: Diagnosis present

## 2017-05-22 DIAGNOSIS — Z7901 Long term (current) use of anticoagulants: Secondary | ICD-10-CM | POA: Diagnosis not present

## 2017-05-22 DIAGNOSIS — F17290 Nicotine dependence, other tobacco product, uncomplicated: Secondary | ICD-10-CM | POA: Diagnosis not present

## 2017-05-22 DIAGNOSIS — I700 Atherosclerosis of aorta: Secondary | ICD-10-CM | POA: Diagnosis not present

## 2017-05-22 DIAGNOSIS — J40 Bronchitis, not specified as acute or chronic: Secondary | ICD-10-CM

## 2017-05-22 DIAGNOSIS — F0390 Unspecified dementia without behavioral disturbance: Secondary | ICD-10-CM | POA: Diagnosis not present

## 2017-05-22 DIAGNOSIS — Z79890 Hormone replacement therapy (postmenopausal): Secondary | ICD-10-CM

## 2017-05-22 DIAGNOSIS — I4891 Unspecified atrial fibrillation: Secondary | ICD-10-CM

## 2017-05-22 LAB — BLOOD GAS, VENOUS
ACID-BASE EXCESS: 7 mmol/L — AB (ref 0.0–2.0)
BICARBONATE: 33.1 mmol/L — AB (ref 20.0–28.0)
O2 SAT: 99 %
PATIENT TEMPERATURE: 37
PCO2 VEN: 51 mmHg (ref 44.0–60.0)
PO2 VEN: 128 mmHg — AB (ref 32.0–45.0)
pH, Ven: 7.42 (ref 7.250–7.430)

## 2017-05-22 LAB — CBC
HEMATOCRIT: 44.7 % (ref 40.0–52.0)
HEMOGLOBIN: 15.1 g/dL (ref 13.0–18.0)
MCH: 29.2 pg (ref 26.0–34.0)
MCHC: 33.8 g/dL (ref 32.0–36.0)
MCV: 86.4 fL (ref 80.0–100.0)
Platelets: 178 10*3/uL (ref 150–440)
RBC: 5.17 MIL/uL (ref 4.40–5.90)
RDW: 16.1 % — ABNORMAL HIGH (ref 11.5–14.5)
WBC: 14 10*3/uL — AB (ref 3.8–10.6)

## 2017-05-22 LAB — BASIC METABOLIC PANEL
Anion gap: 9 (ref 5–15)
BUN: 14 mg/dL (ref 6–20)
CHLORIDE: 94 mmol/L — AB (ref 101–111)
CO2: 28 mmol/L (ref 22–32)
CREATININE: 1.16 mg/dL (ref 0.61–1.24)
Calcium: 8.8 mg/dL — ABNORMAL LOW (ref 8.9–10.3)
GFR calc Af Amer: >60 mL/min (ref >60)
GFR calc non Af Amer: 58 mL/min — ABNORMAL LOW (ref >60)
Glucose, Bld: 266 mg/dL — ABNORMAL HIGH (ref 65–99)
POTASSIUM: 4.2 mmol/L (ref 3.5–5.1)
SODIUM: 131 mmol/L — AB (ref 135–145)

## 2017-05-22 LAB — BRAIN NATRIURETIC PEPTIDE
B NATRIURETIC PEPTIDE 5: 498 pg/mL — AB (ref 0.0–100.0)
B NATRIURETIC PEPTIDE 5: 465 pg/mL — AB (ref 0.0–100.0)

## 2017-05-22 LAB — TROPONIN I
Troponin I: 0.03 ng/mL (ref <0.03)
Troponin I: <0.03 ng/mL (ref <0.03)
Troponin I: <0.03 ng/mL (ref <0.03)

## 2017-05-22 LAB — TSH: TSH: 0.681 u[IU]/mL (ref 0.350–4.500)

## 2017-05-22 LAB — GLUCOSE, CAPILLARY
Glucose-Capillary: 384 mg/dL — ABNORMAL HIGH (ref 65–99)
Glucose-Capillary: 252 mg/dL — ABNORMAL HIGH (ref 65–99)

## 2017-05-22 MED ORDER — PANTOPRAZOLE SODIUM 40 MG PO TBEC
40.0000 mg | DELAYED_RELEASE_TABLET | Freq: Every day | ORAL | Status: DC
  Administered 2017-05-22 – 2017-05-23 (×2): 40 mg via ORAL
  Filled 2017-05-22 (×2): qty 1

## 2017-05-22 MED ORDER — FUROSEMIDE 10 MG/ML IJ SOLN
40.0000 mg | Freq: Two times a day (BID) | INTRAMUSCULAR | Status: DC
Start: 2017-05-22 — End: 2017-05-23
  Administered 2017-05-22 – 2017-05-23 (×2): 40 mg via INTRAVENOUS
  Filled 2017-05-22 (×2): qty 4

## 2017-05-22 MED ORDER — FUROSEMIDE 10 MG/ML IJ SOLN
40.0000 mg | Freq: Once | INTRAMUSCULAR | Status: AC
  Administered 2017-05-22: 40 mg via INTRAVENOUS
  Filled 2017-05-22: qty 4

## 2017-05-22 MED ORDER — POLYETHYLENE GLYCOL 3350 17 G PO PACK: 17.0000 g | PACK | Freq: Every day | ORAL | Status: DC | PRN

## 2017-05-22 MED ORDER — OMEGA-3-ACID ETHYL ESTERS 1 G PO CAPS
2.0000 g | ORAL_CAPSULE | Freq: Two times a day (BID) | ORAL | Status: DC
  Administered 2017-05-22 – 2017-05-23 (×3): 2 g via ORAL
  Filled 2017-05-22 (×3): qty 2

## 2017-05-22 MED ORDER — IPRATROPIUM-ALBUTEROL 0.5-2.5 (3) MG/3ML IN SOLN: 3.0000 mL | Freq: Four times a day (QID) | RESPIRATORY_TRACT | Status: DC | PRN

## 2017-05-22 MED ORDER — ONDANSETRON HCL 4 MG/2ML IJ SOLN: 4.0000 mg | Freq: Four times a day (QID) | INTRAMUSCULAR | Status: DC | PRN

## 2017-05-22 MED ORDER — ALBUTEROL SULFATE (2.5 MG/3ML) 0.083% IN NEBU
2.5000 mg | INHALATION_SOLUTION | Freq: Four times a day (QID) | RESPIRATORY_TRACT | Status: DC
  Administered 2017-05-22 – 2017-05-23 (×3): 2.5 mg via RESPIRATORY_TRACT
  Filled 2017-05-22 (×4): qty 3

## 2017-05-22 MED ORDER — DONEPEZIL HCL 5 MG PO TABS
5.0000 mg | ORAL_TABLET | Freq: Every day | ORAL | Status: DC
  Administered 2017-05-22: 5 mg via ORAL
  Filled 2017-05-22 (×2): qty 1

## 2017-05-22 MED ORDER — FERROUS SULFATE 325 (65 FE) MG PO TABS
325.0000 mg | ORAL_TABLET | Freq: Every day | ORAL | Status: DC
  Administered 2017-05-22 – 2017-05-23 (×2): 325 mg via ORAL
  Filled 2017-05-22 (×2): qty 1

## 2017-05-22 MED ORDER — DULOXETINE HCL 30 MG PO CPEP
60.0000 mg | ORAL_CAPSULE | Freq: Every day | ORAL | Status: DC
  Administered 2017-05-22 – 2017-05-23 (×2): 60 mg via ORAL
  Filled 2017-05-22 (×2): qty 2

## 2017-05-22 MED ORDER — GLIMEPIRIDE 2 MG PO TABS
1.0000 mg | ORAL_TABLET | Freq: Every day | ORAL | Status: DC
  Administered 2017-05-22 – 2017-05-23 (×2): 1 mg via ORAL
  Filled 2017-05-22 (×2): qty 0.5

## 2017-05-22 MED ORDER — ACETAMINOPHEN 650 MG RE SUPP: 650.0000 mg | Freq: Four times a day (QID) | RECTAL | Status: DC | PRN

## 2017-05-22 MED ORDER — METOPROLOL TARTRATE 5 MG/5ML IV SOLN
5.0000 mg | Freq: Once | INTRAVENOUS | Status: AC
  Administered 2017-05-22: 5 mg via INTRAVENOUS
  Filled 2017-05-22: qty 5

## 2017-05-22 MED ORDER — IPRATROPIUM-ALBUTEROL 0.5-2.5 (3) MG/3ML IN SOLN
3.0000 mL | Freq: Once | RESPIRATORY_TRACT | Status: AC
  Administered 2017-05-22: 3 mL via RESPIRATORY_TRACT

## 2017-05-22 MED ORDER — INSULIN ASPART 100 UNIT/ML ~~LOC~~ SOLN
0.0000 [IU] | Freq: Every day | SUBCUTANEOUS | Status: DC
  Administered 2017-05-22: 3 [IU] via SUBCUTANEOUS
  Filled 2017-05-22: qty 1

## 2017-05-22 MED ORDER — INSULIN ASPART 100 UNIT/ML ~~LOC~~ SOLN
0.0000 [IU] | Freq: Three times a day (TID) | SUBCUTANEOUS | Status: DC
  Administered 2017-05-22: 15 [IU] via SUBCUTANEOUS
  Administered 2017-05-23: 5 [IU] via SUBCUTANEOUS
  Filled 2017-05-22 (×2): qty 1

## 2017-05-22 MED ORDER — MOMETASONE FURO-FORMOTEROL FUM 200-5 MCG/ACT IN AERO
2.0000 | INHALATION_SPRAY | Freq: Two times a day (BID) | RESPIRATORY_TRACT | Status: DC
  Administered 2017-05-22 – 2017-05-23 (×3): 2 via RESPIRATORY_TRACT
  Filled 2017-05-22: qty 8.8

## 2017-05-22 MED ORDER — ENOXAPARIN SODIUM 40 MG/0.4ML ~~LOC~~ SOLN
40.0000 mg | SUBCUTANEOUS | Status: DC
  Administered 2017-05-22: 40 mg via SUBCUTANEOUS
  Filled 2017-05-22: qty 0.4

## 2017-05-22 MED ORDER — FERROUS SULFATE 325 (65 FE) MG PO TBEC: 325.0000 mg | DELAYED_RELEASE_TABLET | Freq: Every day | ORAL | Status: DC

## 2017-05-22 MED ORDER — ACETAMINOPHEN 325 MG PO TABS: 650.0000 mg | ORAL_TABLET | Freq: Four times a day (QID) | ORAL | Status: DC | PRN

## 2017-05-22 MED ORDER — MELATONIN 3 MG PO TABS
3.0000 mg | ORAL_TABLET | Freq: Every evening | ORAL | Status: DC | PRN
  Filled 2017-05-22: qty 1

## 2017-05-22 MED ORDER — GLIMEPIRIDE 1 MG PO TABS
1.0000 mg | ORAL_TABLET | Freq: Every day | ORAL | Status: DC
  Filled 2017-05-22: qty 1

## 2017-05-22 MED ORDER — BENAZEPRIL HCL 10 MG PO TABS
10.0000 mg | ORAL_TABLET | Freq: Every day | ORAL | Status: DC
  Administered 2017-05-22 – 2017-05-23 (×2): 10 mg via ORAL
  Filled 2017-05-22 (×2): qty 1

## 2017-05-22 MED ORDER — DIGOXIN 125 MCG PO TABS
0.1250 mg | ORAL_TABLET | Freq: Every day | ORAL | Status: DC
  Administered 2017-05-22: 0.125 mg via ORAL
  Filled 2017-05-22 (×2): qty 1

## 2017-05-22 MED ORDER — LEVOTHYROXINE SODIUM 112 MCG PO TABS
112.0000 ug | ORAL_TABLET | Freq: Every day | ORAL | Status: DC
  Administered 2017-05-22 – 2017-05-23 (×2): 112 ug via ORAL
  Filled 2017-05-22 (×2): qty 1

## 2017-05-22 MED ORDER — ASPIRIN EC 81 MG PO TBEC
81.0000 mg | DELAYED_RELEASE_TABLET | Freq: Every day | ORAL | Status: DC
  Administered 2017-05-22: 81 mg via ORAL
  Filled 2017-05-22: qty 1

## 2017-05-22 MED ORDER — METHYLPREDNISOLONE SODIUM SUCC 125 MG IJ SOLR
125.0000 mg | Freq: Once | INTRAMUSCULAR | Status: AC
  Administered 2017-05-22: 125 mg via INTRAVENOUS

## 2017-05-22 MED ORDER — GUAIFENESIN-DM 100-10 MG/5ML PO SYRP: 5.0000 mL | ORAL_SOLUTION | ORAL | Status: DC | PRN

## 2017-05-22 MED ORDER — TRAZODONE HCL 50 MG PO TABS: 25.0000 mg | ORAL_TABLET | Freq: Every evening | ORAL | Status: DC | PRN

## 2017-05-22 MED ORDER — ALBUTEROL SULFATE (2.5 MG/3ML) 0.083% IN NEBU: 5.0000 mg | INHALATION_SOLUTION | Freq: Once | RESPIRATORY_TRACT | Status: DC

## 2017-05-22 MED ORDER — APIXABAN 5 MG PO TABS
5.0000 mg | ORAL_TABLET | Freq: Two times a day (BID) | ORAL | Status: DC
  Administered 2017-05-22 – 2017-05-23 (×2): 5 mg via ORAL
  Filled 2017-05-22 (×2): qty 1

## 2017-05-22 MED ORDER — ONDANSETRON HCL 4 MG PO TABS: 4.0000 mg | ORAL_TABLET | Freq: Four times a day (QID) | ORAL | Status: DC | PRN

## 2017-05-22 MED ORDER — FUROSEMIDE 20 MG PO TABS
20.0000 mg | ORAL_TABLET | Freq: Two times a day (BID) | ORAL | Status: DC
  Administered 2017-05-22: 20 mg via ORAL
  Filled 2017-05-22: qty 1

## 2017-05-22 MED ORDER — METOPROLOL TARTRATE 50 MG PO TABS
100.0000 mg | ORAL_TABLET | Freq: Two times a day (BID) | ORAL | Status: DC
  Administered 2017-05-22 – 2017-05-23 (×3): 100 mg via ORAL
  Filled 2017-05-22 (×4): qty 2

## 2017-05-22 MED ORDER — MELATONIN 5 MG PO TABS
2.5000 mg | ORAL_TABLET | Freq: Every evening | ORAL | Status: DC | PRN
  Filled 2017-05-22: qty 0.5

## 2017-05-22 NOTE — ED Notes (Signed)
Attempt iv initiation x1 without success. Dawn, rn in to attempt.

## 2017-05-22 NOTE — ED Notes (Signed)
Lab contacted to find out why a blood recollection has not done. Lab states they are sending someone now.

## 2017-05-22 NOTE — ED Notes (Signed)
Waiting on lab for further venipuncture, spoke with robin and paula in lab regarding need previously at 0430. Charge rn notified at [facility].

## 2017-05-22 NOTE — ED Triage Notes (Signed)
Pt arrives POV to triage with c/o SOB x 2-3 days. Pt was recently DC from the hospital for breathing difficulty breathing. Pt's wife reports that she has given pt nebulizer at home. Pt has constant non productive cough.

## 2017-05-22 NOTE — H&P (Signed)
[facility] [place] NAME: Derek Shelton    MR#:  [id number]  DATE OF BIRTH:  April 29, 1940  DATE OF ADMISSION:  05/22/2017  PRIMARY CARE PHYSICIAN: Perrin Maltese, MD   REQUESTING/REFERRING PHYSICIAN: Dr. Dahlia Client  CHIEF COMPLAINT:   Shortness of breath and cough for 2 to 3 days HISTORY OF PRESENT ILLNESS:  Derek Shelton  is a 77 y.o. male with a known history of COPD, congestive heart failure diastolic, dementia, coronary artery disease comes to the emergency room with increasing shortness of breath and dry cough mainly during nighttime, orthopnea, PND. No fever no productive phlegm. Wife in the room. Patient was found to be in rapid aphid with our ER. His heart rate during my evaluation was made in the 110's. He denied any chest pain. He is a poor historian secondary to his dementia. Spoke with patient's son on the phone who was in the medical field.  Patient was found to be in congestive heart failure. He was also in rapid fib with RVR.  PAST MEDICAL HISTORY:   Past Medical History:  Diagnosis Date  . Anemia   . Asthma   . CHF (congestive heart failure) ([place])   . COPD (chronic obstructive pulmonary disease) ([place])   . Coronary artery disease   . Dementia    Per son's report  . Diabetes mellitus without complication ([place])   . Dysrhythmia    Atrial Fibrillation  . GERD (gastroesophageal reflux disease)   . Hyperlipidemia   . Hypertension   . Hypothyroidism   . Shortness of breath dyspnea   . Sleep apnea   . Thyroid disease     PAST SURGICAL HISTOIRY:   Past Surgical History:  Procedure Laterality Date  . CARDIAC CATHETERIZATION    . COLONOSCOPY N/A 08/10/2014   Procedure: COLONOSCOPY;  Surgeon: Manya Silvas, MD;  Location: [facility] ENDOSCOPY;  Service: Endoscopy;  Laterality: N/A;  . COLONOSCOPY WITH PROPOFOL N/A 04/05/2016   Procedure: COLONOSCOPY WITH PROPOFOL;  Surgeon: Lollie Sails, MD;  Location: [facility]  ENDOSCOPY;  Service: Endoscopy;  Laterality: N/A;  . ESOPHAGOGASTRODUODENOSCOPY N/A 08/08/2014   Procedure: ESOPHAGOGASTRODUODENOSCOPY (EGD);  Surgeon: Manya Silvas, MD;  Location: [facility] ENDOSCOPY;  Service: Endoscopy;  Laterality: N/A;  plan for early afternoon  . EYE SURGERY    . GIVENS CAPSULE STUDY N/A 08/12/2014   Procedure: GIVENS CAPSULE STUDY;  Surgeon: Manya Silvas, MD;  Location: [facility] ENDOSCOPY;  Service: Endoscopy;  Laterality: N/A;  . rectal fistula N/A     SOCIAL HISTORY:   Social History   Tobacco Use  . Smoking status: Former Research scientist (life sciences)  . Smokeless tobacco: Current User    Types: Chew  Substance Use Topics  . Alcohol use: No    FAMILY HISTORY:   Family History  Problem Relation Age of Onset  . Diabetes Mother     DRUG ALLERGIES:   Allergies  Allergen Reactions  . Catapres [Clonidine Hcl]   . Coreg [Carvedilol]   . Penicillins     REVIEW OF SYSTEMS:  Review of Systems  Constitutional: Negative for chills, fever and weight loss.  HENT: Negative for ear discharge, ear pain and nosebleeds.   Eyes: Negative for blurred vision, pain and discharge.  Respiratory: Positive for shortness of breath. Negative for sputum production, wheezing and stridor.   Cardiovascular: Positive for palpitations, orthopnea and PND. Negative for chest pain.  Gastrointestinal: Negative for abdominal pain, diarrhea, nausea and vomiting.  Genitourinary: Negative for  frequency and urgency.  Musculoskeletal: Negative for back pain and joint pain.  Neurological: Positive for weakness. Negative for sensory change, speech change and focal weakness.  Psychiatric/Behavioral: Negative for depression and hallucinations. The patient is not nervous/anxious.      MEDICATIONS AT HOME:   Prior to Admission medications   Medication Sig Start Date End Date Taking? Authorizing Provider  aspirin EC 81 MG tablet Take 81 mg by mouth daily.   Yes [provider]  benazepril (LOTENSIN) 10  MG tablet Take 10 mg by mouth daily.   Yes [provider]  budesonide-formoterol (SYMBICORT) 160-4.5 MCG/ACT inhaler Inhale 2 puffs into the lungs 2 (two) times daily.   Yes [provider]  digoxin (LANOXIN) 0.125 MG tablet Take 1 tablet (0.125 mg total) by mouth daily. 02/11/17  Yes [place], Richard, MD  donepezil (ARICEPT) 5 MG tablet Take 5 mg by mouth at bedtime.   Yes [provider]  DULoxetine (CYMBALTA) 60 MG capsule Take 60 mg by mouth daily.   Yes [provider]  esomeprazole (NEXIUM) 40 MG capsule Take 40 mg by mouth daily at 12 noon.   Yes [provider]  ferrous sulfate 325 (65 FE) MG EC tablet Take 325 mg by mouth daily.   Yes [provider]  furosemide (LASIX) 20 MG tablet Take 20 mg by mouth 2 (two) times daily.   Yes [provider]  glimepiride (AMARYL) 1 MG tablet Take 1 tablet (1 mg total) by mouth daily with breakfast. 02/11/17  Yes [place], Richard, MD  ipratropium-albuterol (DUONEB) 0.5-2.5 (3) MG/3ML SOLN Inhale 3 mLs into the lungs every 6 (six) hours as needed for shortness of breath. 01/22/17  Yes [provider]  levothyroxine (SYNTHROID, LEVOTHROID) 112 MCG tablet Take 112 mcg by mouth daily before breakfast.    Yes [provider]  Melatonin 3 MG TABS Take 3 mg by mouth at bedtime as needed.   Yes [provider]  metoprolol tartrate (LOPRESSOR) 100 MG tablet Take 1 tablet (100 mg total) by mouth 2 (two) times daily. 02/11/17  Yes [place], Richard, MD  omega-3 acid ethyl esters (LOVAZA) 1 G capsule Take 2 g by mouth 2 (two) times daily.    Yes [provider]      VITAL SIGNS:  Blood pressure (!) 137/99, pulse (!) 129, temperature 97.9 F (36.6 C), temperature source Oral, resp. rate (!) 31, height 5\' 6"  (1.676 m), weight 83.7 kg (184 lb 9.6 oz), SpO2 99 %.  PHYSICAL EXAMINATION:  GENERAL:  77 y.o.-year-old patient lying in the bed with no acute distress.  obese EYES: Pupils equal, round, reactive to light and accommodation. No scleral icterus. Extraocular muscles intact.  HEENT: Head atraumatic, normocephalic. Oropharynx and nasopharynx clear.  NECK:  Supple, no jugular venous distention. No thyroid enlargement, no tenderness.  LUNGS: Normal breath sounds bilaterally, no wheezing, rales,rhonchi or crepitation. No use of accessory muscles of respiration.  CARDIOVASCULAR: S1, S2 normal. No murmurs, rubs, or gallops. irregulalry irregular ABDOMEN: Soft, nontender, nondistended. Bowel sounds present. No organomegaly or mass.  EXTREMITIES: No pedal edema, cyanosis, or clubbing.  NEUROLOGIC: Cranial nerves II through XII are intact. Muscle strength 5/5 in all extremities. Sensation intact. Gait not checked.  PSYCHIATRIC: The patient is alert and oriented x 2  SKIN: No obvious rash, lesion, or ulcer.   LABORATORY PANEL:   CBC Recent Labs  Lab 05/22/17 0400  WBC 14.0*  HGB 15.1  HCT 44.7  PLT 178   ------------------------------------------------------------------------------------------------------------------  Chemistries  Recent Labs  Lab 05/22/17 0718  NA 131*  K 4.2  CL 94*  CO2 28  GLUCOSE 266*  BUN 14  CREATININE 1.16  CALCIUM 8.8*   ------------------------------------------------------------------------------------------------------------------  Cardiac Enzymes Recent Labs  Lab 05/22/17 0911  TROPONINI <0.03   ------------------------------------------------------------------------------------------------------------------  RADIOLOGY:  Dg Chest 2 View  Result Date: 05/22/2017 CLINICAL DATA:  Initial evaluation for acute onset of shortness of breath. EXAM: CHEST - 2 VIEW COMPARISON:  Prior radiograph from 02/09/2017. FINDINGS: Moderate cardiomegaly, stable from previous. Mediastinal silhouette normal. Aortic atherosclerosis. Lungs normally inflated. No focal infiltrates. Underlying COPD. Mild perihilar vascular  congestion without overt pulmonary edema. No pleural effusion. No pneumothorax. No acute osseous abnormality. Degenerative changes noted within the visualized spine. IMPRESSION: 1. Cardiomegaly with perihilar vascular congestion without overt pulmonary edema. 2. Underlying COPD. 3. Aortic atherosclerosis. Electronically Signed   By: Jeannine Boga M.D.   On: 05/22/2017 04:52    EKG:   afib with RVR IMPRESSION AND PLAN:   Keaten Mashek  is a 77 y.o. male with a known history of COPD, congestive heart failure diastolic, dementia, coronary artery disease comes to the emergency room with increasing shortness of breath and dry cough mainly during nighttime, orthopnea, PND. No fever no productive phlegm. Wife in the room.  1. acute on chronic diastolic congestive heart failure -patient presented with symptoms of shortness of breath, PND, orthopnea, chest x-ray consistent with peripheral vascular congestion and elevated beat-up better peptide with 498. -No fever. -Chest x-ray no evidence of infection -IV Lasix BID, monitor I and o -continue cardiac meds  2. rapid a fib acute on chronic -continue beta-blockers, digoxin, aspirin -refer anticoagulation to cardiology -cardiology consultation placed  3. Diabetes continue home meds and sliding scale insulin  4. Dementia on Aricept  5. Discharge planning physical therapy to see patient -patient will benefit from CHF clinic visit and CHF home health nursing  Above was discussed with patient and family    All the records are reviewed and case discussed with ED provider. Management plans discussed with the patient, family and they are in agreement.  CODE STATUS: FULL  TOTAL TIME TAKING CARE OF THIS PATIENT: *45* minutes.    [name] M.D on 05/22/2017 at 3:11 PM  Between 7am to 6pm - Pager - [phone]  After 6pm go to [email or web address] - password [email or web address] [facility]  [facility]  Office  [phone]  CC: Primary care physician;  Perrin Maltese, MD

## 2017-05-22 NOTE — Progress Notes (Signed)
PT Cancellation Note  Patient Details Name: Derek Shelton MRN: [id number] DOB: 12/19/1940   Cancelled Treatment:    Reason Eval/Treat Not Completed: Other (comment). Consult received and chart reviewed. Pt with elevated HR at 129 and variable. Pt with pending cardio consult at this time, not appropriate for therapy at this time. Will hold and re-attempt next date.   Dodd Schmid 05/22/2017, 2:50 PM Greggory Stallion, PT, DPT [phone]

## 2017-05-22 NOTE — Consult Note (Signed)
Derek Shelton is a 77 y.o. male  [id number]  Primary Cardiologist: Dr. Neoma Laming  Reason for Consultation: Atrial fibrillation with RVR  HPI: 77 year old male with a known medical history of CHF, COPD, CAD, chronic atrial fibrillation, HTN, and hyperlipidemia presented to the ER with shortness of breath and lung congestion but no productive cough. His EKG showed Afib rate controlled 77bpm. He had some mild swelling of his legs but no abdominal pain or vomiting. He was given 3 DuoNebs and some solu-medrol for shortness of breath but then developed Afib with rapid ventricular response 150bpm. He was placed on labetalol drip and admitted for treatment.     Review of Systems: Breathing is better. No chest pain.   Past Medical History:  Diagnosis Date  . Anemia   . Asthma   . CHF (congestive heart failure) ([place])   . COPD (chronic obstructive pulmonary disease) ([place])   . Coronary artery disease   . Dementia    Per son's report  . Diabetes mellitus without complication ([place])   . Dysrhythmia    Atrial Fibrillation  . GERD (gastroesophageal reflux disease)   . Hyperlipidemia   . Hypertension   . Hypothyroidism   . Shortness of breath dyspnea   . Sleep apnea   . Thyroid disease     Medications Prior to Admission  Medication Sig Dispense Refill  . aspirin EC 81 MG tablet Take 81 mg by mouth daily.    . benazepril (LOTENSIN) 10 MG tablet Take 10 mg by mouth daily.    . budesonide-formoterol (SYMBICORT) 160-4.5 MCG/ACT inhaler Inhale 2 puffs into the lungs 2 (two) times daily.    . digoxin (LANOXIN) 0.125 MG tablet Take 1 tablet (0.125 mg total) by mouth daily. 30 tablet 0  . donepezil (ARICEPT) 5 MG tablet Take 5 mg by mouth at bedtime.    . DULoxetine (CYMBALTA) 60 MG capsule Take 60 mg by mouth daily.    Marland Kitchen esomeprazole (NEXIUM) 40 MG capsule Take 40 mg by mouth daily at 12 noon.    . ferrous sulfate 325 (65 FE) MG EC tablet Take 325 mg by mouth daily.    . furosemide  (LASIX) 20 MG tablet Take 20 mg by mouth 2 (two) times daily.    Marland Kitchen glimepiride (AMARYL) 1 MG tablet Take 1 tablet (1 mg total) by mouth daily with breakfast. 30 tablet 0  . ipratropium-albuterol (DUONEB) 0.5-2.5 (3) MG/3ML SOLN Inhale 3 mLs into the lungs every 6 (six) hours as needed for shortness of breath.  3  . levothyroxine (SYNTHROID, LEVOTHROID) 112 MCG tablet Take 112 mcg by mouth daily before breakfast.     . Melatonin 3 MG TABS Take 3 mg by mouth at bedtime as needed.    . metoprolol tartrate (LOPRESSOR) 100 MG tablet Take 1 tablet (100 mg total) by mouth 2 (two) times daily. 60 tablet 0  . omega-3 acid ethyl esters (LOVAZA) 1 G capsule Take 2 g by mouth 2 (two) times daily.        Marland Kitchen albuterol  2.5 mg Nebulization Q6H  . aspirin EC  81 mg Oral Daily  . benazepril  10 mg Oral Daily  . digoxin  0.125 mg Oral Daily  . donepezil  5 mg Oral QHS  . DULoxetine  60 mg Oral Daily  . enoxaparin (LOVENOX) injection  40 mg Subcutaneous Q24H  . ferrous sulfate  325 mg Oral Q breakfast  . furosemide  40 mg Intravenous  Q12H  . glimepiride  1 mg Oral Q breakfast  . insulin aspart  0-15 Units Subcutaneous TID WC  . insulin aspart  0-5 Units Subcutaneous QHS  . levothyroxine  112 mcg Oral QAC breakfast  . metoprolol tartrate  100 mg Oral BID  . mometasone-formoterol  2 puff Inhalation BID  . omega-3 acid ethyl esters  2 g Oral BID  . pantoprazole  40 mg Oral Daily    Infusions:   Allergies  Allergen Reactions  . Catapres [Clonidine Hcl]   . Coreg [Carvedilol]   . Penicillins     Social History   Socioeconomic History  . Marital status: Married    Spouse name: Not on file  . Number of children: Not on file  . Years of education: Not on file  . Highest education level: Not on file  Occupational History  . Not on file  Social Needs  . Financial resource strain: Not on file  . Food insecurity:    Worry: Not on file    Inability: Not on file  . Transportation needs:     Medical: Not on file    Non-medical: Not on file  Tobacco Use  . Smoking status: Former Research scientist (life sciences)  . Smokeless tobacco: Current User    Types: Chew  Substance and Sexual Activity  . Alcohol use: No  . Drug use: No  . Sexual activity: Not on file  Lifestyle  . Physical activity:    Days per week: Not on file    Minutes per session: Not on file  . Stress: Not on file  Relationships  . Social connections:    Talks on phone: Patient refused    Gets together: Patient refused    Attends religious service: Patient refused    Active member of club or organization: Patient refused    Attends meetings of clubs or organizations: Patient refused    Relationship status: Patient refused  . Intimate partner violence:    Fear of current or ex partner: Patient refused    Emotionally abused: Patient refused    Physically abused: Patient refused    Forced sexual activity: Patient refused  Other Topics Concern  . Not on file  Social History Narrative  . Not on file    Family History  Problem Relation Age of Onset  . Diabetes Mother     PHYSICAL EXAM: Vitals:   05/22/17 0842 05/22/17 1355  BP: (!) 137/99   Pulse: (!) 129   Resp:    Temp: 97.9 F (36.6 C)   SpO2: 99% 99%     Intake/Output Summary (Last 24 hours) at 05/22/2017 1530 Last data filed at 05/22/2017 1349 Gross per 24 hour  Intake 240 ml  Output 500 ml  Net -260 ml    General:  Well appearing. No respiratory difficulty HEENT: normal Neck: supple. no JVD. Carotids 2+ bilat; no bruits. No lymphadenopathy or thryomegaly appreciated. Cor: PMI nondisplaced. Regular rate & rhythm. No rubs, gallops or murmurs. Lungs: clear Abdomen: soft, nontender, nondistended. No hepatosplenomegaly. No bruits or masses. Good bowel sounds. Extremities: no cyanosis, clubbing, rash, edema Neuro: alert & oriented x 3, cranial nerves grossly intact. moves all 4 extremities w/o difficulty. Affect pleasant.  ECG: Afib 99bpm  Results for  orders placed or performed during the hospital encounter of 05/22/17 (from the past 24 hour(s))  CBC     Status: Abnormal   Collection Time: 05/22/17  4:00 AM  Result Value Ref Range   WBC 14.0 (H)  3.8 - 10.6 K/uL   RBC 5.17 4.40 - 5.90 MIL/uL   Hemoglobin 15.1 13.0 - 18.0 g/dL   HCT 44.7 40.0 - 52.0 %   MCV 86.4 80.0 - 100.0 fL   MCH 29.2 26.0 - 34.0 pg   MCHC 33.8 32.0 - 36.0 g/dL   RDW 16.1 (H) 11.5 - 14.5 %   Platelets 178 150 - 440 K/uL  Troponin I     Status: Abnormal   Collection Time: 05/22/17  4:00 AM  Result Value Ref Range   Troponin I 0.03 (HH) <0.03 ng/mL  Blood gas, venous     Status: Abnormal   Collection Time: 05/22/17  4:00 AM  Result Value Ref Range   pH, Ven 7.42 7.250 - 7.430   pCO2, Ven 51 44.0 - 60.0 mmHg   pO2, Ven 128.0 (H) 32.0 - 45.0 mmHg   Bicarbonate 33.1 (H) 20.0 - 28.0 mmol/L   Acid-Base Excess 7.0 (H) 0.0 - 2.0 mmol/L   O2 Saturation 99.0 %   Patient temperature 37.0    Collection site VENOUS    Sample type VENOUS   Basic metabolic panel     Status: Abnormal   Collection Time: 05/22/17  7:18 AM  Result Value Ref Range   Sodium 131 (L) 135 - 145 mmol/L   Potassium 4.2 3.5 - 5.1 mmol/L   Chloride 94 (L) 101 - 111 mmol/L   CO2 28 22 - 32 mmol/L   Glucose, Bld 266 (H) 65 - 99 mg/dL   BUN 14 6 - 20 mg/dL   Creatinine, Ser 1.16 0.61 - 1.24 mg/dL   Calcium 8.8 (L) 8.9 - 10.3 mg/dL   GFR calc non Af Amer 58 (L) >60 mL/min   GFR calc Af Amer >60 >60 mL/min   Anion gap 9 5 - 15  Brain natriuretic peptide     Status: Abnormal   Collection Time: 05/22/17  7:18 AM  Result Value Ref Range   B Natriuretic Peptide 498.0 (H) 0.0 - 100.0 pg/mL  Troponin I     Status: None   Collection Time: 05/22/17  9:11 AM  Result Value Ref Range   Troponin I <0.03 <0.03 ng/mL  TSH     Status: None   Collection Time: 05/22/17  9:11 AM  Result Value Ref Range   TSH 0.681 0.350 - 4.500 uIU/mL  Brain natriuretic peptide     Status: Abnormal   Collection Time:  05/22/17  9:11 AM  Result Value Ref Range   B Natriuretic Peptide 465.0 (H) 0.0 - 100.0 pg/mL   Dg Chest 2 View  Result Date: 05/22/2017 CLINICAL DATA:  Initial evaluation for acute onset of shortness of breath. EXAM: CHEST - 2 VIEW COMPARISON:  Prior radiograph from 02/09/2017. FINDINGS: Moderate cardiomegaly, stable from previous. Mediastinal silhouette normal. Aortic atherosclerosis. Lungs normally inflated. No focal infiltrates. Underlying COPD. Mild perihilar vascular congestion without overt pulmonary edema. No pleural effusion. No pneumothorax. No acute osseous abnormality. Degenerative changes noted within the visualized spine. IMPRESSION: 1. Cardiomegaly with perihilar vascular congestion without overt pulmonary edema. 2. Underlying COPD. 3. Aortic atherosclerosis. Electronically Signed   By: Jeannine Boga M.D.   On: 05/22/2017 04:52     ASSESSMENT AND PLAN: Chronic atrial fibrillation with intermittent RVR: Draw digoxin level tomorrow morning, if within normal limts, may increase digoxin to 0.250mg /day. Add eliquis 5mg  BID for anticoagulation. Mild troponin elevation likely due to demand ischemia. Afib rate currently controlled, continue metoprolol 100mg  BID and will reassess in  the morning.  Jake Bathe, NP-C Cell: [phone]

## 2017-05-22 NOTE — ED Notes (Signed)
Critical troponin of 0.03 called from lab. Dr. Dahlia Client notified, no new verbal orders received.

## 2017-05-22 NOTE — Care Management (Signed)
Previous history of home health services through [facility] which was closed in Feb. 2019.

## 2017-05-22 NOTE — Progress Notes (Signed)
Patient HR under control.  Does not appear to be in any discomfort today.  He has remained on room air with no distress.  He is pleasantly demented and does not speak any (or very little) english.  Patient's language can be assessed using language line and son is also fluent in [place].

## 2017-05-22 NOTE — Progress Notes (Signed)
Inpatient Diabetes Program Recommendations  AACE/ADA: New Consensus Statement on Inpatient Glycemic Control (2015)  Target Ranges:  Prepandial:   less than 140 mg/dL      Peak postprandial:   less than 180 mg/dL (1-2 hours)      Critically ill patients:  140 - 180 mg/dL   Lab Results  Component Value Date   GLUCAP 155 (H) 02/22/2017   HGBA1C 7.7 (H) 02/08/2017    Review of Glycemic Control:  Results for MALCOM, SELMER (MRN [id number]) as of 05/22/2017 11:36  Ref. Range 05/22/2017 07:18  Glucose Latest Ref Range: 65 - 99 mg/dL 266 (H)   Diabetes history: Type 2 Dm  Outpatient Diabetes medications:  Amaryl 1 mg daily Current orders for Inpatient glycemic control:  Amaryl 1 mg daily Inpatient Diabetes Program Recommendations:   Please add Novolog moderate tid with meals and HS.  Thanks, Adah Perl, RN, BC-ADM Inpatient Diabetes Coordinator Pager [phone] (8a-5p)

## 2017-05-22 NOTE — ED Provider Notes (Addendum)
[facility] Emergency Department Provider Note   ____________________________________________   First MD Initiated Contact with Patient 05/22/17 [phone]     (approximate)  I have reviewed the triage vital signs and the nursing notes.   HISTORY  Chief Complaint Shortness of Breath    HPI Derek Shelton is a 77 y.o. male who comes into the hospital today with some difficulty breathing for the last couple of nights.  The patient feels congested but states that he cannot cough anything up.  He feels as though he has fluid in his lungs.  His family states that his face has been more swollen than normal as well.  The patient's cough has been keeping him up at night.  The patient has not had any fevers or sick contacts.  He is been taking his medicines normally.  He has had some mild swelling in his legs but denies any chest pain abdominal pain nausea vomiting.  He states that his neck feels stiff.  He is here today for evaluation.  Past Medical History:  Diagnosis Date  . Anemia   . Asthma   . CHF (congestive heart failure) ([place])   . COPD (chronic obstructive pulmonary disease) ([place])   . Coronary artery disease   . Dementia    Per son's report  . Diabetes mellitus without complication ([place])   . Dysrhythmia    Atrial Fibrillation  . GERD (gastroesophageal reflux disease)   . Hyperlipidemia   . Hypertension   . Hypothyroidism   . Shortness of breath dyspnea   . Sleep apnea   . Thyroid disease     Patient Active Problem List   Diagnosis Date Noted  . A-fib ([place]) 05/22/2017  . Chest pain 02/21/2017  . AKI (acute kidney injury) ([place]) 02/21/2017  . SOB (shortness of breath) 02/08/2017  . Atrial fibrillation with RVR ([place]) 02/08/2017  . GI bleeding 08/06/2014  . Anemia 08/06/2014  . Bradycardia 08/06/2014  . Atrial fibrillation ([place]) 08/06/2014  . Hyponatremia 08/06/2014  . Chronic diastolic heart failure ([place]) 08/06/2014  . Diabetes ([place]) 08/06/2014   . OSA (obstructive sleep apnea) 08/06/2014    Past Surgical History:  Procedure Laterality Date  . CARDIAC CATHETERIZATION    . COLONOSCOPY N/A 08/10/2014   Procedure: COLONOSCOPY;  Surgeon: Manya Silvas, MD;  Location: [facility] ENDOSCOPY;  Service: Endoscopy;  Laterality: N/A;  . COLONOSCOPY WITH PROPOFOL N/A 04/05/2016   Procedure: COLONOSCOPY WITH PROPOFOL;  Surgeon: Lollie Sails, MD;  Location[facility] ENDOSCOPY;  Service: Endoscopy;  Laterality: N/A;  . ESOPHAGOGASTRODUODENOSCOPY N/A 08/08/2014   Procedure: ESOPHAGOGASTRODUODENOSCOPY (EGD);  Surgeon: Manya Silvas, MD;  Location: [facility] ENDOSCOPY;  Service: Endoscopy;  Laterality: N/A;  plan for early afternoon  . EYE SURGERY    . GIVENS CAPSULE STUDY N/A 08/12/2014   Procedure: GIVENS CAPSULE STUDY;  Surgeon: Manya Silvas, MD;  Location: [facility] ENDOSCOPY;  Service: Endoscopy;  Laterality: N/A;  . rectal fistula N/A     Prior to Admission medications   Medication Sig Start Date End Date Taking? Authorizing Provider  benazepril (LOTENSIN) 10 MG tablet Take 10 mg by mouth daily.   Yes [provider]  budesonide-formoterol (SYMBICORT) 160-4.5 MCG/ACT inhaler Inhale 2 puffs into the lungs 2 (two) times daily.   Yes [provider]  donepezil (ARICEPT) 5 MG tablet Take 5 mg by mouth at bedtime.   Yes [provider]  DULoxetine (CYMBALTA) 60 MG capsule Take 60 mg by mouth daily.  Yes [provider]  esomeprazole (NEXIUM) 40 MG capsule Take 40 mg by mouth daily at 12 noon.   Yes [provider]  ferrous sulfate 325 (65 FE) MG EC tablet Take 325 mg by mouth daily.   Yes [provider]  furosemide (LASIX) 20 MG tablet Take 20 mg by mouth 2 (two) times daily.   Yes [provider]  glimepiride (AMARYL) 1 MG tablet Take 1 tablet (1 mg total) by mouth daily with breakfast. 02/11/17  Yes Wieting, Richard, MD  ipratropium-albuterol (DUONEB) 0.5-2.5 (3) MG/3ML SOLN Inhale 3 mLs  into the lungs every 6 (six) hours as needed for shortness of breath. 01/22/17  Yes [provider]  levothyroxine (SYNTHROID, LEVOTHROID) 112 MCG tablet Take 112 mcg by mouth daily before breakfast.    Yes [provider]  Melatonin 3 MG TABS Take 3 mg by mouth at bedtime as needed (sleep).    Yes [provider]  metoprolol tartrate (LOPRESSOR) 100 MG tablet Take 1 tablet (100 mg total) by mouth 2 (two) times daily. 02/11/17  Yes Wieting, Richard, MD  omega-3 acid ethyl esters (LOVAZA) 1 G capsule Take 2 g by mouth 2 (two) times daily.    Yes [provider]  apixaban (ELIQUIS) 5 MG TABS tablet Take 1 tablet (5 mg total) by mouth 2 (two) times daily. 05/23/17   [name], MD  digoxin (LANOXIN) 0.25 MG tablet Take 1 tablet (0.25 mg total) by mouth daily. 05/23/17   [name], MD  finasteride (PROSCAR) 5 MG tablet Take 1 tablet (5 mg total) by mouth daily. 05/29/17 05/29/18  Festus Aloe, MD  guaiFENesin-dextromethorphan (ROBITUSSIN DM) 100-10 MG/5ML syrup Take 5 mLs by mouth every 4 (four) hours as needed for cough. 05/23/17   [name], MD    Allergies Catapres [clonidine hcl]; Coreg [carvedilol]; and Penicillins  Family History  Problem Relation Age of Onset  . Diabetes Mother     Social History Social History   Tobacco Use  . Smoking status: Former Research scientist (life sciences)  . Smokeless tobacco: Current User    Types: Chew  Substance Use Topics  . Alcohol use: No  . Drug use: No    Review of Systems  Constitutional: No fever/chills Eyes: No visual changes. ENT: No sore throat. Cardiovascular: Denies chest pain. Respiratory: Cough and shortness of breath. Gastrointestinal: No abdominal pain.  No nausea, no vomiting.  No diarrhea.  No constipation. Genitourinary: Negative for dysuria. Musculoskeletal: Negative for back pain. Skin: Negative for rash. Neurological: Negative for headaches, focal weakness or  numbness.   ____________________________________________   PHYSICAL EXAM:  VITAL SIGNS: ED Triage Vitals  Enc Vitals Group     BP 05/22/17 0310 136/83     Pulse Rate 05/22/17 0310 71     Resp 05/22/17 0310 20     Temp 05/22/17 0310 98.6 F (37 C)     Temp Source 05/22/17 0310 Oral     SpO2 05/22/17 0310 97 %     Weight 05/22/17 0310 197 lb (89.4 kg)     Height 05/22/17 0310 5\' 6"  (1.676 m)     Head Circumference --      Peak Flow --      Pain Score 05/22/17 0324 0     Pain Loc --      Pain Edu? --      Excl. in [place]? --     Constitutional: Alert and oriented. Well appearing and in moderate respiratory distress. Eyes: Conjunctivae are normal.  PERRL. EOMI. Head: Atraumatic. Nose: No congestion/rhinnorhea. Mouth/Throat: Mucous membranes are moist.  Oropharynx non-erythematous. Cardiovascular: Irregular rate and rhythm. Grossly normal heart sounds.  Good peripheral circulation. Respiratory: Increased respiratory effort. Tight expiratory wheezing Gastrointestinal: Soft and nontender. No distention.  Positive bowel sounds Musculoskeletal: No lower extremity tenderness mild pitting edema  Neurologic:  Normal speech and language.  Skin:  Skin is warm, dry and intact.  Psychiatric: Mood and affect are normal.   ____________________________________________   LABS (all labs ordered are listed, but only abnormal results are displayed)  Labs Reviewed  CBC - Abnormal; Notable for the following components:      Result Value   WBC 14.0 (*)    RDW 16.1 (*)    All other components within normal limits  TROPONIN I - Abnormal; Notable for the following components:   Troponin I 0.03 (*)    All other components within normal limits  BLOOD GAS, VENOUS - Abnormal; Notable for the following components:   pO2, Ven 128.0 (*)    Bicarbonate 33.1 (*)    Acid-Base Excess 7.0 (*)    All other components within normal limits  BASIC METABOLIC PANEL - Abnormal; Notable for the following  components:   Sodium 131 (*)    Chloride 94 (*)    Glucose, Bld 266 (*)    Calcium 8.8 (*)    GFR calc non Af Amer 58 (*)    All other components within normal limits  BRAIN NATRIURETIC PEPTIDE - Abnormal; Notable for the following components:   B Natriuretic Peptide 498.0 (*)    All other components within normal limits  BRAIN NATRIURETIC PEPTIDE - Abnormal; Notable for the following components:   B Natriuretic Peptide 465.0 (*)    All other components within normal limits  BASIC METABOLIC PANEL - Abnormal; Notable for the following components:   Sodium 133 (*)    Chloride 93 (*)    Glucose, Bld 303 (*)    BUN 26 (*)    Calcium 8.6 (*)    All other components within normal limits  DIGOXIN LEVEL - Abnormal; Notable for the following components:   Digoxin Level 0.6 (*)    All other components within normal limits  GLUCOSE, CAPILLARY - Abnormal; Notable for the following components:   Glucose-Capillary 384 (*)    All other components within normal limits  GLUCOSE, CAPILLARY - Abnormal; Notable for the following components:   Glucose-Capillary 252 (*)    All other components within normal limits  GLUCOSE, CAPILLARY - Abnormal; Notable for the following components:   Glucose-Capillary 247 (*)    All other components within normal limits  GLUCOSE, CAPILLARY - Abnormal; Notable for the following components:   Glucose-Capillary 131 (*)    All other components within normal limits  TROPONIN I  TROPONIN I  TROPONIN I  TSH   ____________________________________________  EKG  ED ECG REPORT I, Loney Hering, the attending physician, personally viewed and interpreted this ECG.   Date: 05/22/2017  EKG Time: 308  Rate: 77  Rhythm: atrial fibrillation, rate 77, RBBB  Axis: Normal  Intervals:right bundle branch block  ST&T Change: Flipped T waves in lead V1, V2, V3  ____________________________________________  RADIOLOGY  ED MD interpretation:  CXR: Cardiomegaly with  perihilar vascular congestion without overt pulmonary edema, underlying COPD, aortic atherosclerosis  Official radiology report(s): No results found.  ____________________________________________   PROCEDURES  Procedure(s) performed: please, see procedure note(s).  .Critical Care Performed by: Loney Hering, MD Authorized  by: Loney Hering, MD   Critical care provider statement:    Critical care time (minutes):  30   Critical care start time:  05/22/2017 4:00 AM   Critical care end time:  05/22/2017 6:00 AM   Critical care time was exclusive of:  Separately billable procedures and treating other patients   Critical care was necessary to treat or prevent imminent or life-threatening deterioration of the following conditions: atrial fibrillation with rapid ventricular response.   Critical care was time spent personally by me on the following activities:  Development of treatment plan with patient or surrogate, evaluation of patient's response to treatment, examination of patient, obtaining history from patient or surrogate, ordering and performing treatments and interventions, ordering and review of laboratory studies, ordering and review of radiographic studies, pulse oximetry, re-evaluation of patient's condition and review of old charts   I assumed direction of critical care for this patient from another provider in my specialty: no      Critical Care performed: Yes, see critical care note(s)  ____________________________________________   INITIAL IMPRESSION / ASSESSMENT AND PLAN / ED COURSE  As part of my medical decision making, I reviewed the following data within the electronic MEDICAL RECORD NUMBER Notes from prior ED visits and [place] Controlled Substance Database   This is a 77 year old man who comes into the hospital today with some shortness of breath  When the patient arrived he was wheezing significantly so my concern was bronchitis versus a COPD, versus congestive  heart failure  The patient was given 3 DuoNeb's and some Solu-Medrol to help with his wheezing.  He also received a chest x-ray and some blood work to include a CBC BMP BNP blood gas troponin.  The patient's chest x-ray showed some cardiomegaly and some perihilar congestion without overt edema.  The patient had a blood gas but it did not show any significantly increased PCO2.  I feel that the patient may have some bronchitis causing his shortness of breath and wheezing.  The wheezing did improve but after receiving the medication the patient became tachycardic and developed atrial fibrillation with rapid ventricular rate.  We did give the patient 3 doses of Lopressor and while his blood pressure would decrease initially it did not stay down would continue to increase up to the 150s.  The patient will be placed on a labetalol drip and the patient will be admitted to the hospitalist service.      ____________________________________________   FINAL CLINICAL IMPRESSION(S) / ED DIAGNOSES  Final diagnoses:  Atrial fibrillation with rapid ventricular response (HCC)  SOB (shortness of breath)  Bronchitis     ED Discharge Orders        Ordered    apixaban (ELIQUIS) 5 MG TABS tablet  2 times daily     05/23/17 1131    guaiFENesin-dextromethorphan (ROBITUSSIN DM) 100-10 MG/5ML syrup  Every 4 hours PRN     05/23/17 1131    Increase activity slowly     05/23/17 1131    Diet - low sodium heart healthy     05/23/17 1131    digoxin (LANOXIN) 0.25 MG tablet  Daily     05/23/17 1135       Note:  This document was prepared using Dragon voice recognition software and may include unintentional dictation errors.    Loney Hering, MD 05/22/17 1610    Loney Hering, MD 06/06/17 0038    Loney Hering, MD 06/06/17 [phone]

## 2017-05-22 NOTE — Progress Notes (Signed)
Family Meeting Note  Advance Directive:no  Today a meeting took place with the wife  pts unable to participate due JK:DTOIZT capacity/dementia   The following were discussed:Patient's diagnosis: patient has history of chronic diastolic congestive heart failure a fib and diabetes. Came in with congestive heart failure in afib with RVR  code status with wife she wants full code. She will discuss with rest of her family including her son., Patient's progosis: stable  Time spent during discussion:16 mins [name], MD

## 2017-05-22 NOTE — ED Notes (Signed)
Notified beyonce in lab regarding need for venipuncture, states will send someone.

## 2017-05-22 NOTE — ED Notes (Signed)
Report to kaley, rn.  

## 2017-05-23 DIAGNOSIS — I110 Hypertensive heart disease with heart failure: Secondary | ICD-10-CM | POA: Diagnosis not present

## 2017-05-23 DIAGNOSIS — R0602 Shortness of breath: Secondary | ICD-10-CM | POA: Diagnosis not present

## 2017-05-23 LAB — BASIC METABOLIC PANEL
Anion gap: 10 (ref 5–15)
BUN: 26 mg/dL — ABNORMAL HIGH (ref 6–20)
CALCIUM: 8.6 mg/dL — AB (ref 8.9–10.3)
CO2: 30 mmol/L (ref 22–32)
CREATININE: 1.11 mg/dL (ref 0.61–1.24)
Chloride: 93 mmol/L — ABNORMAL LOW (ref 101–111)
GFR calc non Af Amer: >60 mL/min (ref >60)
Glucose, Bld: 303 mg/dL — ABNORMAL HIGH (ref 65–99)
Potassium: 3.9 mmol/L (ref 3.5–5.1)
Sodium: 133 mmol/L — ABNORMAL LOW (ref 135–145)

## 2017-05-23 LAB — GLUCOSE, CAPILLARY
Glucose-Capillary: 247 mg/dL — ABNORMAL HIGH (ref 65–99)
Glucose-Capillary: 131 mg/dL — ABNORMAL HIGH (ref 65–99)

## 2017-05-23 LAB — DIGOXIN LEVEL: Digoxin Level: 0.6 ng/mL — ABNORMAL LOW (ref 0.8–2.0)

## 2017-05-23 MED ORDER — DIGOXIN 250 MCG PO TABS
0.2500 mg | ORAL_TABLET | Freq: Every day | ORAL | Status: DC
  Administered 2017-05-23: 0.25 mg via ORAL
  Filled 2017-05-23: qty 1

## 2017-05-23 MED ORDER — GUAIFENESIN-DM 100-10 MG/5ML PO SYRP: 5.0000 mL | ORAL_SOLUTION | ORAL | 0 refills | Status: DC | PRN

## 2017-05-23 MED ORDER — DIGOXIN 250 MCG PO TABS: 0.2500 mg | ORAL_TABLET | Freq: Every day | ORAL | 0 refills | Status: DC

## 2017-05-23 MED ORDER — APIXABAN 5 MG PO TABS: 5.0000 mg | ORAL_TABLET | Freq: Two times a day (BID) | ORAL | 1 refills | Status: DC

## 2017-05-23 NOTE — Evaluation (Signed)
Physical Therapy Evaluation Patient Details Name: Derek Shelton MRN: [id number] DOB: [birth year] Today's Date: 05/23/2017   History of Present Illness  Pt is a 77 y.o. male with a known history of COPD, congestive heart failure diastolic, dementia, coronary artery disease comes to the emergency room with increasing shortness of breath and dry cough mainly during nighttime, orthopnea, PND. No fever no productive phlegm.   Patient was found to be in rapid A-fib in the ER.  Assessment includes: acute on chronic diastolic congestive heart failure, rapid A-fib acute on chronic, DM, and dementia.      Clinical Impression  Pt presents with mild deficits in strength, mobility, and gait, and with moderate deficits in activity tolerance.  Pt required extra time and effort during bed mobility tasks and SBA during transfers from various height surfaces with good control and stability.  Pt able to amb 1 x 50' and 1 x 100' with a RW with good stability as well as 2 x 20' without AD with good stability.  Pt's baseline SpO2 was 95% with HR 75 bpm.  After amb his SpO2 was 96% and HR 106 bpm.  Pt was slow but steady ascending and descending 6 steps with one rail secondary to B knee pain that is chronic per pt and family.  Pt's SpO2 was 96% and HR 110 bpm after stair assessment with no adverse symptoms noted other than the knee pain.  Pt will benefit from HHPT services upon discharge to safely address above deficits for decreased caregiver assistance and decreased risk of further functional decline.      Follow Up Recommendations Home health PT;Supervision for mobility/OOB    Equipment Recommendations  None recommended by PT    Recommendations for Other Services       Precautions / Restrictions Precautions Precautions: Fall Restrictions Weight Bearing Restrictions: No      Mobility  Bed Mobility Overal bed mobility: Modified Independent             General bed mobility comments: Extra time and  effort with bed mobility tasks but no physical assistance required  Transfers Overall transfer level: Needs assistance Equipment used: None Transfers: Sit to/from Stand Sit to Stand: Supervision         General transfer comment: Good stability and control during transfers  Ambulation/Gait Ambulation/Gait assistance: Supervision Ambulation Distance (Feet): 100 Feet Assistive device: Rolling walker (2 wheeled);None Gait Pattern/deviations: Step-through pattern;Decreased step length - right;Decreased step length - left     General Gait Details: Slow cadence with amb but steady with both a RW and without AD; SpO2 96% and HR 106 after amb from baseline of 95% adn 75 bpm.  Stairs Stairs: Yes Stairs assistance: Supervision Stair Management: One rail Left Number of Stairs: 6 General stair comments: Pt steady ascending and descending stairs with one rail with c/o B knee pain that is chronic for patient; SpO2 96% and HR 110 after stairs from baseline of 95% adn 75 bpm.  Wheelchair Mobility    Modified Rankin (Stroke Patients Only)       Balance Overall balance assessment: No apparent balance deficits (not formally assessed)                                           Pertinent Vitals/Pain Pain Assessment: No/denies pain    Home Living Family/patient expects to be discharged to:: Private residence(History from son  via phone conversation) Living Arrangements: Spouse/significant other;Children Available Help at Discharge: Family;Available 24 hours/day Type of Home: House Home Access: Stairs to enter Entrance Stairs-Rails: Right;Left;Can reach both Entrance Stairs-Number of Steps: 3 Home Layout: One level Home Equipment: Cane - single point      Prior Function Level of Independence: Independent with assistive device(s)         Comments: Mod Ind with amb limited community distances with SPC, no fall history     Hand Dominance   Dominant Hand:  Right    Extremity/Trunk Assessment   Upper Extremity Assessment Upper Extremity Assessment: Overall WFL for tasks assessed    Lower Extremity Assessment Lower Extremity Assessment: Generalized weakness       Communication   Communication: Interpreter utilized(Interpreter name Adria Devon, ID# Rennie Plowman)  Cognition Arousal/Alertness: Awake/alert Behavior During Therapy: WFL for tasks assessed/performed Overall Cognitive Status: Within Functional Limits for tasks assessed                                        General Comments      Exercises Total Joint Exercises Ankle Circles/Pumps: AROM;Both;10 reps Heel Slides: AROM;Both;5 reps Hip ABduction/ADduction: AROM;Both;5 reps Straight Leg Raises: AROM;Both;5 reps Long Arc Quad: Both;5 reps;Strengthening Knee Flexion: Strengthening;Both;5 reps Marching in Standing: AROM;Both;10 reps   Assessment/Plan    PT Assessment Patient needs continued PT services  PT Problem List Decreased strength;Decreased activity tolerance;Decreased mobility       PT Treatment Interventions Gait training;Stair training;Functional mobility training;Balance training;Therapeutic exercise;Therapeutic activities;DME instruction;Patient/family education    PT Goals (Current goals can be found in the Care Plan section)  Acute Rehab PT Goals PT Goal Formulation: Patient unable to participate in goal setting Time For Goal Achievement: 06/05/17 Potential to Achieve Goals: Good    Frequency Min 2X/week   Barriers to discharge        Co-evaluation               AM-PAC PT "6 Clicks" Daily Activity  Outcome Measure Difficulty turning over in bed (including adjusting bedclothes, sheets and blankets)?: A Little Difficulty moving from lying on back to sitting on the side of the bed? : A Little Difficulty sitting down on and standing up from a chair with arms (e.g., wheelchair, bedside commode, etc,.)?: None Help needed moving to and from  a bed to chair (including a wheelchair)?: None Help needed walking in hospital room?: None Help needed climbing 3-5 steps with a railing? : A Little 6 Click Score: 21    End of Session Equipment Utilized During Treatment: Gait belt Activity Tolerance: Patient tolerated treatment well Patient left: in chair;with nursing/sitter in room;Other (comment)(CNA in room setting pt up with chair alarm in chair) Nurse Communication: Mobility status PT Visit Diagnosis: Muscle weakness (generalized) (M62.81);Difficulty in walking, not elsewhere classified (R26.2)    Time: 8299-3716 PT Time Calculation (min) (ACUTE ONLY): 35 min   Charges:   PT Evaluation $PT Eval Low Complexity: 1 Low PT Treatments $Therapeutic Exercise: 8-22 mins   PT G Codes:        DRoyetta Asal PT, DPT 05/23/17, 12:15 PM

## 2017-05-23 NOTE — Discharge Summary (Addendum)
[facility] [place] NAME: Derek Shelton    MR#:  [id number]  DATE OF BIRTH:  [birth year]  DATE OF ADMISSION:  05/22/2017 ADMITTING PHYSICIAN: [name], MD  DATE OF DISCHARGE: 05/23/2017  PRIMARY CARE PHYSICIAN: Perrin Maltese, MD    ADMISSION DIAGNOSIS:  SOB (shortness of breath) [R06.02] Bronchitis [J40] Atrial fibrillation with rapid ventricular response ([place]) [I48.91]  DISCHARGE DIAGNOSIS:  atrial fibrillation with RVR--improved rate. Now on Oral anticoagulation (started this admission) CHF acute on Chronic diastolic Chronic Dementia SECONDARY DIAGNOSIS:   Past Medical History:  Diagnosis Date  . Anemia   . Asthma   . CHF (congestive heart failure) ([place])   . COPD (chronic obstructive pulmonary disease) ([place])   . Coronary artery disease   . Dementia    Per son's report  . Diabetes mellitus without complication ([place])   . Dysrhythmia    Atrial Fibrillation  . GERD (gastroesophageal reflux disease)   . Hyperlipidemia   . Hypertension   . Hypothyroidism   . Shortness of breath dyspnea   . Sleep apnea   . Thyroid disease     HOSPITAL COURSE:   Derek Shelton  is a 77 y.o. male with a known history of COPD, congestive heart failure diastolic, dementia, coronary artery disease comes to the emergency room with increasing shortness of breath and dry cough mainly during nighttime, orthopnea, PND. No fever no productive phlegm. Wife in the room.  1. acute on chronic diastolic congestive heart failure -patient presented with symptoms of shortness of breath, PND, orthopnea, chest x-ray consistent with peripheral vascular congestion and elevated beat-up better peptide with 498. -No fever. -Chest x-ray no evidence of infection -IV Lasix BID--change to oral lasix  -continue cardiac meds  2. rapid a fib acute on chronic -continue beta-blockers, digoxin, aspirin. Increased digoxin does to 0.25 mg (renal functions  ok) -defer anticoagulation to cardiology--started on oral eliquis -cardiology consultation with Dr Humphrey Rolls noted  3. Diabetes continue - home meds and sliding scale insulin  4. Dementia on Aricept  5. Discharge planning physical therapy to see patient -patient will benefit from CHF clinic visit and CHF home health nursing  HHPT<RN AND aide Overall stable. [place] for cardiology point to d/c  CONSULTS OBTAINED:  Treatment Team:  Dionisio David, MD  DRUG ALLERGIES:   Allergies  Allergen Reactions  . Catapres [Clonidine Hcl]   . Coreg [Carvedilol]   . Penicillins     DISCHARGE MEDICATIONS:   Allergies as of 05/23/2017      Reactions   Catapres [clonidine Hcl]    Coreg [carvedilol]    Penicillins       Medication List    STOP taking these medications   aspirin EC 81 MG tablet     TAKE these medications   apixaban 5 MG Tabs tablet Commonly known as:  ELIQUIS Take 1 tablet (5 mg total) by mouth 2 (two) times daily.   benazepril 10 MG tablet Commonly known as:  LOTENSIN Take 10 mg by mouth daily.   budesonide-formoterol 160-4.5 MCG/ACT inhaler Commonly known as:  SYMBICORT Inhale 2 puffs into the lungs 2 (two) times daily.   digoxin 0.25 MG tablet Commonly known as:  LANOXIN Take 1 tablet (0.25 mg total) by mouth daily. What changed:    medication strength  how much to take   donepezil 5 MG tablet Commonly known as:  ARICEPT Take 5 mg by mouth at bedtime.   DULoxetine 60 MG  capsule Commonly known as:  CYMBALTA Take 60 mg by mouth daily.   esomeprazole 40 MG capsule Commonly known as:  NEXIUM Take 40 mg by mouth daily at 12 noon.   ferrous sulfate 325 (65 FE) MG EC tablet Take 325 mg by mouth daily.   furosemide 20 MG tablet Commonly known as:  LASIX Take 20 mg by mouth 2 (two) times daily.   glimepiride 1 MG tablet Commonly known as:  AMARYL Take 1 tablet (1 mg total) by mouth daily with breakfast.   guaiFENesin-dextromethorphan 100-10  MG/5ML syrup Commonly known as:  ROBITUSSIN DM Take 5 mLs by mouth every 4 (four) hours as needed for cough.   ipratropium-albuterol 0.5-2.5 (3) MG/3ML Soln Commonly known as:  DUONEB Inhale 3 mLs into the lungs every 6 (six) hours as needed for shortness of breath.   levothyroxine 112 MCG tablet Commonly known as:  SYNTHROID, LEVOTHROID Take 112 mcg by mouth daily before breakfast.   Melatonin 3 MG Tabs Take 3 mg by mouth at bedtime as needed.   metoprolol tartrate 100 MG tablet Commonly known as:  LOPRESSOR Take 1 tablet (100 mg total) by mouth 2 (two) times daily.   omega-3 acid ethyl esters 1 g capsule Commonly known as:  LOVAZA Take 2 g by mouth 2 (two) times daily.       If you experience worsening of your admission symptoms, develop shortness of breath, life threatening emergency, suicidal or homicidal thoughts you must seek medical attention immediately by calling 911 or calling your MD immediately  if symptoms less severe.  You Must read complete instructions/literature along with all the possible adverse reactions/side effects for all the Medicines you take and that have been prescribed to you. Take any new Medicines after you have completely understood and accept all the possible adverse reactions/side effects.   Please note  You were cared for by a hospitalist during your hospital stay. If you have any questions about your discharge medications or the care you received while you were in the hospital after you are discharged, you can call the unit and asked to speak with the hospitalist on call if the hospitalist that took care of you is not available. Once you are discharged, your primary care physician will handle any further medical issues. Please note that NO REFILLS for any discharge medications will be authorized once you are discharged, as it is imperative that you return to your primary care physician (or establish a relationship with a primary care physician if you  do not have one) for your aftercare needs so that they can reassess your need for medications and monitor your lab values. Today   SUBJECTIVE  Feels better  VITAL SIGNS:  Blood pressure (!) 125/98, pulse 60, temperature 98.2 F (36.8 C), temperature source Oral, resp. rate 16, height 5\' 6"  (1.676 m), weight 83.7 kg (184 lb 9.6 oz), SpO2 97 %.  I/O:    Intake/Output Summary (Last 24 hours) at 05/23/2017 1156 Last data filed at 05/23/2017 1021 Gross per 24 hour  Intake 480 ml  Output 0 ml  Net 480 ml    PHYSICAL EXAMINATION:  GENERAL:  77 y.o.-year-old patient lying in the bed with no acute distress.  EYES: Pupils equal, round, reactive to light and accommodation. No scleral icterus. Extraocular muscles intact.  HEENT: Head atraumatic, normocephalic. Oropharynx and nasopharynx clear.  NECK:  Supple, no jugular venous distention. No thyroid enlargement, no tenderness.  LUNGS: Normal breath sounds bilaterally, no wheezing, rales,rhonchi or  crepitation. No use of accessory muscles of respiration.  CARDIOVASCULAR: S1, S2 normal. No murmurs, rubs, or gallops.  ABDOMEN: Soft, non-tender, non-distended. Bowel sounds present. No organomegaly or mass.  EXTREMITIES: No pedal edema, cyanosis, or clubbing.  NEUROLOGIC: Cranial nerves II through XII are intact. Muscle strength 5/5 in all extremities. Sensation intact. Gait not checked.  PSYCHIATRIC: The patient is alert and oriented x 3.  SKIN: No obvious rash, lesion, or ulcer.   DATA REVIEW:   CBC  Recent Labs  Lab 05/22/17 0400  WBC 14.0*  HGB 15.1  HCT 44.7  PLT 178    Chemistries  Recent Labs  Lab 05/23/17 0534  NA 133*  K 3.9  CL 93*  CO2 30  GLUCOSE 303*  BUN 26*  CREATININE 1.11  CALCIUM 8.6*    Microbiology Results   No results found for this or any previous visit (from the past 240 hour(s)).  RADIOLOGY:  Dg Chest 2 View  Result Date: 05/22/2017 CLINICAL DATA:  Initial evaluation for acute onset of shortness  of breath. EXAM: CHEST - 2 VIEW COMPARISON:  Prior radiograph from 02/09/2017. FINDINGS: Moderate cardiomegaly, stable from previous. Mediastinal silhouette normal. Aortic atherosclerosis. Lungs normally inflated. No focal infiltrates. Underlying COPD. Mild perihilar vascular congestion without overt pulmonary edema. No pleural effusion. No pneumothorax. No acute osseous abnormality. Degenerative changes noted within the visualized spine. IMPRESSION: 1. Cardiomegaly with perihilar vascular congestion without overt pulmonary edema. 2. Underlying COPD. 3. Aortic atherosclerosis. Electronically Signed   By: Jeannine Boga M.D.   On: 05/22/2017 04:52     Management plans discussed with the patient, family and they are in agreement.  CODE STATUS:     Code Status Orders  (From admission, onward)        Start     Ordered   05/22/17 0845  Full code  Continuous     05/22/17 0844    Code Status History    Date Active Date Inactive Code Status Order ID Comments User Context   02/21/2017 0413 02/22/2017 2111 Full Code [id number]  Harrie Foreman, MD ED   02/08/2017 0615 02/11/2017 1655 Full Code [id number]  Harrie Foreman, MD Inpatient   08/06/2014 1821 08/12/2014 2019 Full Code [id number]  Aldean Jewett, MD Inpatient      TOTAL TIME TAKING CARE OF THIS PATIENT: *40 minutes.    [name] M.D on 05/23/2017 at 11:56 AM  Between 7am to 6pm - Pager - [phone] After 6pm go to [email or web address] - password [email or web address]  [facility]  Office  [phone]  CC: Primary care physician; Perrin Maltese, MD   Patient ID: Derek Shelton, male   DOB: 05-12-1940, 77 y.o.   MRN: [id number]

## 2017-05-23 NOTE — Care Management (Signed)
Discharge to home today per Dr. Posey Pronto. Spoke with the son at the bedside. Would like to go back with [place]. Will update Floydene Flock, [facility] representative Shelbie Ammons RN MSN CCM Care Management [phone]

## 2017-05-23 NOTE — Discharge Instructions (Signed)

## 2017-05-23 NOTE — Progress Notes (Signed)
Inpatient Diabetes Program Recommendations  AACE/ADA: New Consensus Statement on Inpatient Glycemic Control (2015)  Target Ranges:  Prepandial:   less than 140 mg/dL      Peak postprandial:   less than 180 mg/dL (1-2 hours)      Critically ill patients:  140 - 180 mg/dL   Lab Results  Component Value Date   GLUCAP 247 (H) 05/23/2017   HGBA1C 7.7 (H) 02/08/2017    Review of Glycemic ControlResults for CORDELLE, DAHMEN (MRN [id number]) as of 05/23/2017 08:57  Ref. Range 05/22/2017 17:25 05/22/2017 20:57 05/23/2017 07:32  Glucose-Capillary Latest Ref Range: 65 - 99 mg/dL 384 (H) 252 (H) 247 (H)   Diabetes history: Type 2 DM  Outpatient Diabetes medications: Amaryl 1 mg daily Current orders for Inpatient glycemic control:  Novolog moderate tid with meals and HS, Amaryl 1 mg daily Inpatient Diabetes Program Recommendations:   Please consider adding Lantus 12 units daily. Also consider adding Novolog meal coverage 3 units tid with meals (hold if patient eats less than 50%). Consider holding Amaryl while patient is in the hospital.   Thanks,  Adah Perl, RN, BC-ADM Inpatient Diabetes Coordinator Pager [phone] (8a-5p)

## 2017-05-23 NOTE — Progress Notes (Signed)
SUBJECTIVE: feeling better   Vitals:   05/22/17 2056 05/22/17 2141 05/23/17 0422 05/23/17 0732  BP: 106/76  117/74 (!) 125/98  Pulse: (!) 36 (!) 113 74 60  Resp:    16  Temp: 98.4 F (36.9 C)  98.1 F (36.7 C) 98.2 F (36.8 C)  TempSrc: Oral  Oral Oral  SpO2: 97%  98% 97%  Weight:      Height:        Intake/Output Summary (Last 24 hours) at 05/23/2017 0804 Last data filed at 05/23/2017 0421 Gross per 24 hour  Intake 240 ml  Output 500 ml  Net -260 ml    LABS: Basic Metabolic Panel: Recent Labs    05/22/17 0718 05/23/17 0534  NA 131* 133*  K 4.2 3.9  CL 94* 93*  CO2 28 30  GLUCOSE 266* 303*  BUN 14 26*  CREATININE 1.16 1.11  CALCIUM 8.8* 8.6*   Liver Function Tests: No results for input(s): AST, ALT, ALKPHOS, BILITOT, PROT, ALBUMIN in the last 72 hours. No results for input(s): LIPASE, AMYLASE in the last 72 hours. CBC: Recent Labs    05/22/17 0400  WBC 14.0*  HGB 15.1  HCT 44.7  MCV 86.4  PLT 178   Cardiac Enzymes: Recent Labs    05/22/17 0911 05/22/17 1506 05/22/17 2048  TROPONINI <0.03 <0.03 <0.03   BNP: Invalid input(s): POCBNP D-Dimer: No results for input(s): DDIMER in the last 72 hours. Hemoglobin A1C: No results for input(s): HGBA1C in the last 72 hours. Fasting Lipid Panel: No results for input(s): CHOL, HDL, LDLCALC, TRIG, CHOLHDL, LDLDIRECT in the last 72 hours. Thyroid Function Tests: Recent Labs    05/22/17 0911  TSH 0.681   Anemia Panel: No results for input(s): VITAMINB12, FOLATE, FERRITIN, TIBC, IRON, RETICCTPCT in the last 72 hours.   PHYSICAL EXAM General: Well developed, well nourished, in no acute distress HEENT:  Normocephalic and atramatic Neck:  No JVD.  Lungs: Clear bilaterally to auscultation and percussion. Heart: HRRR . Normal S1 and S2 without gallops or murmurs.  Abdomen: Bowel sounds are positive, abdomen soft and non-tender  Msk:  Back normal, normal gait. Normal strength and tone for age. Extremities:  No clubbing, cyanosis or edema.   Neuro: Alert and oriented X 3. Psych:  Good affect, responds appropriately  TELEMETRY: afib 95/min  ASSESSMENT AND PLAN: Afib with good rate control 80-100, and on ellequis, cough and SOB improved.  Active Problems:   A-fib ([place])    Derek David, MD, [facility] 05/23/2017 8:04 AM

## 2017-05-26 ENCOUNTER — Emergency Department: Payer: Medicare Other

## 2017-05-26 ENCOUNTER — Emergency Department
Admission: EM | Admit: 2017-05-26 | Discharge: 2017-05-27 | Disposition: A | Discharge status: Home / Self Care | Payer: Medicare Other | Attending: Emergency Medicine | Admitting: Emergency Medicine

## 2017-05-26 ENCOUNTER — Other Ambulatory Visit: Payer: Self-pay

## 2017-05-26 ENCOUNTER — Encounter: Payer: Self-pay | Admitting: Emergency Medicine

## 2017-05-26 DIAGNOSIS — E039 Hypothyroidism, unspecified: Secondary | ICD-10-CM | POA: Insufficient documentation

## 2017-05-26 DIAGNOSIS — R51 Headache: Secondary | ICD-10-CM | POA: Diagnosis present

## 2017-05-26 DIAGNOSIS — I110 Hypertensive heart disease with heart failure: Secondary | ICD-10-CM | POA: Insufficient documentation

## 2017-05-26 DIAGNOSIS — F17220 Nicotine dependence, chewing tobacco, uncomplicated: Secondary | ICD-10-CM | POA: Diagnosis not present

## 2017-05-26 DIAGNOSIS — J449 Chronic obstructive pulmonary disease, unspecified: Secondary | ICD-10-CM | POA: Insufficient documentation

## 2017-05-26 DIAGNOSIS — F0390 Unspecified dementia without behavioral disturbance: Secondary | ICD-10-CM | POA: Diagnosis not present

## 2017-05-26 DIAGNOSIS — I5032 Chronic diastolic (congestive) heart failure: Secondary | ICD-10-CM | POA: Insufficient documentation

## 2017-05-26 DIAGNOSIS — D329 Benign neoplasm of meninges, unspecified: Secondary | ICD-10-CM | POA: Insufficient documentation

## 2017-05-26 DIAGNOSIS — R519 Headache, unspecified: Secondary | ICD-10-CM

## 2017-05-26 DIAGNOSIS — I2510 Atherosclerotic heart disease of native coronary artery without angina pectoris: Secondary | ICD-10-CM | POA: Diagnosis not present

## 2017-05-26 MED ORDER — GADOBENATE DIMEGLUMINE 529 MG/ML IV SOLN
15.0000 mL | Freq: Once | INTRAVENOUS | Status: AC | PRN
  Administered 2017-05-26: 15 mL via INTRAVENOUS

## 2017-05-26 MED ORDER — ACETAMINOPHEN 500 MG PO TABS
1000.0000 mg | ORAL_TABLET | Freq: Once | ORAL | Status: AC
  Administered 2017-05-26: 1000 mg via ORAL
  Filled 2017-05-26: qty 2

## 2017-05-26 NOTE — ED Triage Notes (Signed)
Pt arrives POV to triage with HA since last night. Pt was DC from hospital on Wednesday. Pt reports HA since last night with no sleep. Pt has hx of dementia but is otherwise in NAD at this time.

## 2017-05-26 NOTE — ED Notes (Signed)
Lab called stating previous draw of green and lavender top was insufficient.  This RN will redraw.

## 2017-05-26 NOTE — ED Provider Notes (Signed)
[facility] Emergency Department Provider Note       Time seen: ----------------------------------------- 10:19 PM on 05/26/2017 -----------------------------------------   I have reviewed the triage vital signs and the nursing notes.  HISTORY   Chief Complaint Headache    HPI Derek Shelton is a 77 y.o. male with a history of anemia, asthma, CHF, COPD, coronary artery disease, dementia, diabetes who presents to the ED for headache.  Patient was recently placed on Eliquis after being hospitalized and found to be in A. fib.  Family is concerned because he had some swelling in the right side of his head and was having a headache.  They do not think he fell.  He denies fevers, chills or other complaints.  Currently he is not complaining of any headache.  Past Medical History:  Diagnosis Date  . Anemia   . Asthma   . CHF (congestive heart failure) ([place])   . COPD (chronic obstructive pulmonary disease) ([place])   . Coronary artery disease   . Dementia    Per son's report  . Diabetes mellitus without complication ([place])   . Dysrhythmia    Atrial Fibrillation  . GERD (gastroesophageal reflux disease)   . Hyperlipidemia   . Hypertension   . Hypothyroidism   . Shortness of breath dyspnea   . Sleep apnea   . Thyroid disease     Patient Active Problem List   Diagnosis Date Noted  . A-fib ([place]) 05/22/2017  . Chest pain 02/21/2017  . AKI (acute kidney injury) ([place]) 02/21/2017  . SOB (shortness of breath) 02/08/2017  . Atrial fibrillation with RVR ([place]) 02/08/2017  . GI bleeding 08/06/2014  . Anemia 08/06/2014  . Bradycardia 08/06/2014  . Atrial fibrillation ([place]) 08/06/2014  . Hyponatremia 08/06/2014  . Chronic diastolic heart failure ([place]) 08/06/2014  . Diabetes ([place]) 08/06/2014  . OSA (obstructive sleep apnea) 08/06/2014    Past Surgical History:  Procedure Laterality Date  . CARDIAC CATHETERIZATION    . COLONOSCOPY N/A 08/10/2014   Procedure:  COLONOSCOPY;  Surgeon: Manya Silvas, MD;  Location: [facility] ENDOSCOPY;  Service: Endoscopy;  Laterality: N/A;  . COLONOSCOPY WITH PROPOFOL N/A 04/05/2016   Procedure: COLONOSCOPY WITH PROPOFOL;  Surgeon: Lollie Sails, MD;  Location: [facility] ENDOSCOPY;  Service: Endoscopy;  Laterality: N/A;  . ESOPHAGOGASTRODUODENOSCOPY N/A 08/08/2014   Procedure: ESOPHAGOGASTRODUODENOSCOPY (EGD);  Surgeon: Manya Silvas, MD;  [facility] ENDOSCOPY;  Service: Endoscopy;  Laterality: N/A;  plan for early afternoon  . EYE SURGERY    . GIVENS CAPSULE STUDY N/A 08/12/2014   Procedure: GIVENS CAPSULE STUDY;  Surgeon: Manya Silvas, MD;  Location: [facility] ENDOSCOPY;  Service: Endoscopy;  Laterality: N/A;  . rectal fistula N/A     Allergies Catapres [clonidine hcl]; Coreg [carvedilol]; and Penicillins  Social History Social History   Tobacco Use  . Smoking status: Former Research scientist (life sciences)  . Smokeless tobacco: Current User    Types: Chew  Substance Use Topics  . Alcohol use: No  . Drug use: No   Review of Systems Constitutional: Negative for fever. Cardiovascular: Negative for chest pain. Respiratory: Negative for shortness of breath. Gastrointestinal: Negative for abdominal pain, vomiting and diarrhea. Genitourinary: Negative for dysuria. Musculoskeletal: Negative for back pain. Skin: Negative for rash. Neurological: Positive for headache  All systems negative/normal/unremarkable except as stated in the HPI  ____________________________________________   PHYSICAL EXAM:  VITAL SIGNS: ED Triage Vitals  Enc Vitals Group     BP 05/26/17 2122 140/77  Pulse Rate 05/26/17 2122 84     Resp 05/26/17 2122 17     Temp 05/26/17 2122 98.4 F (36.9 C)     Temp Source 05/26/17 2122 Oral     SpO2 05/26/17 2122 95 %     Weight 05/26/17 2123 191 lb (86.6 kg)     Height 05/26/17 2123 5\' 6"  (1.676 m)     Head Circumference --      Peak Flow --      Pain Score --      Pain Loc --      Pain Edu? --       Excl. in [place]? --    Constitutional: Alert and oriented. Well appearing and in no distress. Eyes: Conjunctivae are normal. Normal extraocular movements. ENT   Head: Normocephalic and atraumatic.  Some right temporal swelling is noted and perhaps right temporal artery tenderness   Nose: No congestion/rhinnorhea.   Mouth/Throat: Mucous membranes are moist.   Neck: No stridor. Cardiovascular: Normal rate, regular rhythm. No murmurs, rubs, or gallops. Respiratory: Normal respiratory effort without tachypnea nor retractions. Breath sounds are clear and equal bilaterally. No wheezes/rales/rhonchi. Gastrointestinal: Soft and nontender. Normal bowel sounds Musculoskeletal: Nontender with normal range of motion in extremities. No lower extremity tenderness nor edema. Neurologic:  Normal speech and language. No gross focal neurologic deficits are appreciated.  Skin:  Skin is warm, dry and intact. No rash noted. Psychiatric: Mood and affect are normal. Speech and behavior are normal.  ____________________________________________  ED COURSE:  As part of my medical decision making, I reviewed the following data within the [place] History obtained from family if available, nursing notes, old chart and ekg, as well as notes from prior ED visits. Patient presented for headache, we will assess with labs and imaging as indicated at this time.   Procedures ____________________________________________   LABS (pertinent positives/negatives)  Labs Reviewed  CBC WITH DIFFERENTIAL/PLATELET  URINALYSIS, COMPLETE (UACMP) WITH MICROSCOPIC  SEDIMENTATION RATE  COMPREHENSIVE METABOLIC PANEL  TROPONIN I  SEDIMENTATION RATE    RADIOLOGY  CT head IMPRESSION: 1. Rounded mass in the left middle cranial fossa anterior to the left temporal lobe with apparent infiltration of the skull base. Further characterization with MRI without and with contrast is recommended. 2. No acute  intracranial hemorrhage. Mild age-related atrophy and chronic microvascular ischemic changes. ____________________________________________  DIFFERENTIAL DIAGNOSIS   Tension headache, dehydration, intracranial hemorrhage, neoplasm  FINAL ASSESSMENT AND PLAN  Headache   Plan: The patient had presented for headache. Patient's labs are still pending at this time. Patient's imaging regarding CT scan revealed a rounded mass in the left middle cranial fossa.  MRI has been ordered and is pending at this time.   Laurence Aly, MD   Note: This note was generated in part or whole with voice recognition software. Voice recognition is usually quite accurate but there are transcription errors that can and very often do occur. I apologize for any typographical errors that were not detected and corrected.     Earleen Newport, MD 05/26/17 [phone]

## 2017-05-26 NOTE — ED Notes (Signed)
Per family, pt had eliquis increased after hospitalization. Family is concerned about what appears to be a swollen vein on right side of head. Per family, pt had no LOC or head trauma.

## 2017-05-26 NOTE — ED Notes (Signed)
Lab called requesting a recollect on lgt green top. Pt in MRI. RN taking over assignment is aware.

## 2017-05-26 NOTE — ED Notes (Signed)
Pt and family have talked to MRI on phone.

## 2017-05-26 NOTE — ED Notes (Signed)
MRI at bedside. Pt transported. NAD at this time .

## 2017-05-27 LAB — COMPREHENSIVE METABOLIC PANEL
ALK PHOS: 61 U/L (ref 38–126)
ALT: 17 U/L (ref 17–63)
ANION GAP: 8 (ref 5–15)
AST: 21 U/L (ref 15–41)
Albumin: 3.4 g/dL — ABNORMAL LOW (ref 3.5–5.0)
BILIRUBIN TOTAL: 0.9 mg/dL (ref 0.3–1.2)
BUN: 15 mg/dL (ref 6–20)
CALCIUM: 8.8 mg/dL — AB (ref 8.9–10.3)
CO2: 30 mmol/L (ref 22–32)
CREATININE: 0.83 mg/dL (ref 0.61–1.24)
Chloride: 93 mmol/L — ABNORMAL LOW (ref 101–111)
GFR calc non Af Amer: >60 mL/min (ref >60)
Glucose, Bld: 195 mg/dL — ABNORMAL HIGH (ref 65–99)
Potassium: 4 mmol/L (ref 3.5–5.1)
SODIUM: 131 mmol/L — AB (ref 135–145)
Total Protein: 7.3 g/dL (ref 6.5–8.1)

## 2017-05-27 LAB — CBC WITH DIFFERENTIAL/PLATELET
BASOS ABS: 0.1 10*3/uL (ref 0–0.1)
BASOS PCT: 1 %
EOS PCT: 25 %
Eosinophils Absolute: 2.9 10*3/uL — ABNORMAL HIGH (ref 0–0.7)
HEMATOCRIT: 43.8 % (ref 40.0–52.0)
Hemoglobin: 14.9 g/dL (ref 13.0–18.0)
LYMPHS PCT: 13 %
Lymphs Abs: 1.5 10*3/uL (ref 1.0–3.6)
MCH: 28.9 pg (ref 26.0–34.0)
MCHC: 34.1 g/dL (ref 32.0–36.0)
MCV: 84.8 fL (ref 80.0–100.0)
MONO ABS: 0.8 10*3/uL (ref 0.2–1.0)
Monocytes Relative: 7 %
Neutro Abs: 6.5 10*3/uL (ref 1.4–6.5)
Neutrophils Relative %: 54 %
PLATELETS: 220 10*3/uL (ref 150–440)
RBC: 5.17 MIL/uL (ref 4.40–5.90)
RDW: 16 % — AB (ref 11.5–14.5)
WBC: 11.7 10*3/uL — ABNORMAL HIGH (ref 3.8–10.6)

## 2017-05-27 LAB — TROPONIN I: Troponin I: 0.03 ng/mL (ref <0.03)

## 2017-05-27 LAB — SEDIMENTATION RATE: SED RATE: 12 mm/h (ref 0–20)

## 2017-05-27 NOTE — ED Notes (Signed)
22G LH IV removed, site is clean, dry, and intact.

## 2017-05-27 NOTE — Discharge Instructions (Signed)
Please follow-up with your primary care physician as well as neurosurgery for further evaluation of your meningioma.  Please treat your headache with Tylenol and please ensure that you are getting adequate sleep.

## 2017-05-27 NOTE — ED Provider Notes (Signed)
-----------------------------------------   1:31 AM on 05/27/2017 -----------------------------------------   Blood pressure 133/79, pulse 91, temperature 98.4 F (36.9 C), temperature source Oral, resp. rate 20, height 5\' 6"  (1.676 m), weight 86.6 kg (191 lb), SpO2 97 %.  Assuming care from Dr. Jimmye Norman.  In short, Derek Shelton is a 77 y.o. male with a chief complaint of Headache .  Refer to the original H&P for additional details.  The current plan of care is to follow up the results of the MRI and the completion of the blood work..  The patient CBC and CMP are unremarkable.  The patient's troponin is 0.03 and the sed rate is 12.  The patient's MRI shows a left middle cranial fossa extra-axial mass measuring up to 28 mm infiltrating left greater wing of the sphenoid and extending into the left infratemporal fossa, the mass has mildly increased in size from 2016.  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  The patient's headache is improved at this time.  His blood work is unremarkable and he does have a meningioma but it is not in the area that he had pain.  Since his pain is improved I will encourage him to follow-up with his primary care physician as well as neurosurgery.  I discussed this with the family and they understand the plans as stated.       Loney Hering, MD 05/27/17 [phone]

## 2017-05-27 NOTE — ED Notes (Signed)
Lab notified of lavender and green top redraw.

## 2017-05-27 NOTE — ED Notes (Signed)
CRITICAL LAB: TROPONIN is 0.03, [employer], Dr. Dahlia Client notified, orders received

## 2017-05-29 ENCOUNTER — Encounter: Payer: Self-pay | Admitting: Urology

## 2017-05-29 ENCOUNTER — Ambulatory Visit (INDEPENDENT_AMBULATORY_CARE_PROVIDER_SITE_OTHER): Payer: Medicare Other | Admitting: Urology

## 2017-05-29 VITALS — BP 108/76 | HR 74 | Ht 66.0 in | Wt 188.9 lb

## 2017-05-29 DIAGNOSIS — R3915 Urgency of urination: Secondary | ICD-10-CM

## 2017-05-29 DIAGNOSIS — N138 Other obstructive and reflux uropathy: Secondary | ICD-10-CM

## 2017-05-29 DIAGNOSIS — R32 Unspecified urinary incontinence: Secondary | ICD-10-CM | POA: Diagnosis not present

## 2017-05-29 DIAGNOSIS — N401 Enlarged prostate with lower urinary tract symptoms: Secondary | ICD-10-CM

## 2017-05-29 LAB — URINALYSIS, COMPLETE
BILIRUBIN UA: NEGATIVE
Glucose, UA: NEGATIVE
Ketones, UA: NEGATIVE
Leukocytes, UA: NEGATIVE
NITRITE UA: NEGATIVE
Protein, UA: NEGATIVE
RBC UA: NEGATIVE
SPEC GRAV UA: 1.01 (ref 1.005–1.030)
UUROB: 1 mg/dL (ref 0.2–1.0)
pH, UA: 7 (ref 5.0–7.5)

## 2017-05-29 LAB — BLADDER SCAN AMB NON-IMAGING: Scan Result: 81

## 2017-05-29 MED ORDER — FINASTERIDE 5 MG PO TABS: 5.0000 mg | ORAL_TABLET | Freq: Every day | ORAL | 3 refills | Status: AC

## 2017-05-29 NOTE — Progress Notes (Signed)
05/29/2017 11:22 AM   Derek Shelton 1940/07/05 [id number]  Referring provider: Perrin Maltese, MD [place], [place] [address]  Chief Complaint  Patient presents with  . Urinary Incontinence    HPI:  Referral for BPH, lower urinary tract symptoms and incontinence.  Patient is a 77 year old male with a history of BPH and prior finasteride use. He ran out and family was wondering if he can restart. He has dementia. He gets up and goes to the bathroom, but leaks on the way. His son is here and wife on the phone. They couldn't fill out an IPSS. He can go every 2 -3 hours, but waits too long. He voids with a good flow when observed. PVR 81 ml.   He underwent CT scan of the abdomen and pelvis January 2019 which revealed about a 30 g prostate, the bladder was not distended and the kidneys were normal with a left renal cyst.  Follow-up ultrasound May 01, 2017 revealed similar findings.  I reviewed all the images.    PMH: Past Medical History:  Diagnosis Date  . Anemia   . Asthma   . CHF (congestive heart failure) ([place])   . COPD (chronic obstructive pulmonary disease) ([place])   . Coronary artery disease   . Dementia    Per son's report  . Diabetes mellitus without complication ([place])   . Dysrhythmia    Atrial Fibrillation  . GERD (gastroesophageal reflux disease)   . Hyperlipidemia   . Hypertension   . Hypothyroidism   . Shortness of breath dyspnea   . Sleep apnea   . Thyroid disease     Surgical History: Past Surgical History:  Procedure Laterality Date  . CARDIAC CATHETERIZATION    . COLONOSCOPY N/A 08/10/2014   Procedure: COLONOSCOPY;  Surgeon: Manya Silvas, MD;  Location: [facility] ENDOSCOPY;  Service: Endoscopy;  Laterality: N/A;  . COLONOSCOPY WITH PROPOFOL N/A 04/05/2016   Procedure: COLONOSCOPY WITH PROPOFOL;  Surgeon: Lollie Sails, MD;  Location: [facility] ENDOSCOPY;  Service: Endoscopy;  Laterality: N/A;  . ESOPHAGOGASTRODUODENOSCOPY N/A 08/08/2014   Procedure: ESOPHAGOGASTRODUODENOSCOPY (EGD);  Surgeon: Manya Silvas, MD;  Location: [facility] ENDOSCOPY;  Service: Endoscopy;  Laterality: N/A;  plan for early afternoon  . EYE SURGERY    . GIVENS CAPSULE STUDY N/A 08/12/2014   Procedure: GIVENS CAPSULE STUDY;  Surgeon: Manya Silvas, MD;  Location: [facility] ENDOSCOPY;  Service: Endoscopy;  Laterality: N/A;  . rectal fistula N/A     Home Medications:  Allergies as of 05/29/2017      Reactions   Catapres [clonidine Hcl]    Coreg [carvedilol]    Penicillins       Medication List        Accurate as of 05/29/17 11:22 AM. Always use your most recent med list.          apixaban 5 MG Tabs tablet Commonly known as:  ELIQUIS Take 1 tablet (5 mg total) by mouth 2 (two) times daily.   benazepril 10 MG tablet Commonly known as:  LOTENSIN Take 10 mg by mouth daily.   budesonide-formoterol 160-4.5 MCG/ACT inhaler Commonly known as:  SYMBICORT Inhale 2 puffs into the lungs 2 (two) times daily.   digoxin 0.25 MG tablet Commonly known as:  LANOXIN Take 1 tablet (0.25 mg total) by mouth daily.   donepezil 5 MG tablet Commonly known as:  ARICEPT Take 5 mg by mouth at bedtime.   DULoxetine 60 MG capsule Commonly known as:  CYMBALTA  Take 60 mg by mouth daily.   esomeprazole 40 MG capsule Commonly known as:  NEXIUM Take 40 mg by mouth daily at 12 noon.   ferrous sulfate 325 (65 FE) MG EC tablet Take 325 mg by mouth daily.   furosemide 20 MG tablet Commonly known as:  LASIX Take 20 mg by mouth 2 (two) times daily.   glimepiride 1 MG tablet Commonly known as:  AMARYL Take 1 tablet (1 mg total) by mouth daily with breakfast.   guaiFENesin-dextromethorphan 100-10 MG/5ML syrup Commonly known as:  ROBITUSSIN DM Take 5 mLs by mouth every 4 (four) hours as needed for cough.   ipratropium-albuterol 0.5-2.5 (3) MG/3ML Soln Commonly known as:  DUONEB Inhale 3 mLs into the lungs every 6 (six) hours as needed for shortness of breath.     levothyroxine 112 MCG tablet Commonly known as:  SYNTHROID, LEVOTHROID Take 112 mcg by mouth daily before breakfast.   Melatonin 3 MG Tabs Take 3 mg by mouth at bedtime as needed (sleep).   metoprolol tartrate 100 MG tablet Commonly known as:  LOPRESSOR Take 1 tablet (100 mg total) by mouth 2 (two) times daily.   omega-3 acid ethyl esters 1 g capsule Commonly known as:  LOVAZA Take 2 g by mouth 2 (two) times daily.       Allergies:  Allergies  Allergen Reactions  . Catapres [Clonidine Hcl]   . Coreg [Carvedilol]   . Penicillins     Family History: Family History  Problem Relation Age of Onset  . Diabetes Mother     Social History:  reports that he has quit smoking. His smokeless tobacco use includes chew. He reports that he does not drink alcohol or use drugs.  ROS:                                        Physical Exam: There were no vitals taken for this visit.  Constitutional:  Alert and oriented, No acute distress. HEENT: [place] AT, moist mucus membranes.  Trachea midline, no masses. Cardiovascular: No clubbing, cyanosis, or edema. Respiratory: Normal respiratory effort, no increased work of breathing. GI: Abdomen is soft, nontender, nondistended, no abdominal masses GU: No CVA tenderness Lymph: No cervical or inguinal lymphadenopathy. Skin: No rashes, bruises or suspicious lesions. Neurologic: Grossly intact, no focal deficits, moving all 4 extremities. Psychiatric: Normal mood and affect. DRE: prostate abut 40 grams, smooth, no hard area or nodule   Laboratory Data: Lab Results  Component Value Date   WBC 11.7 (H) 05/26/2017   HGB 14.9 05/26/2017   HCT 43.8 05/26/2017   MCV 84.8 05/26/2017   PLT 220 05/26/2017    Lab Results  Component Value Date   CREATININE 0.83 05/26/2017    No results found for: PSA  No results found for: TESTOSTERONE  Lab Results  Component Value Date   HGBA1C 7.7 (H) 02/08/2017    Urinalysis     Component Value Date/Time   COLORURINE YELLOW (A) 02/21/2017 1050   APPEARANCEUR HAZY (A) 02/21/2017 1050   APPEARANCEUR Clear 05/06/2014 2100   LABSPEC 1.003 (L) 02/21/2017 1050   LABSPEC 1.004 05/06/2014 2100   PHURINE 6.0 02/21/2017 [place] 02/21/2017 1050   GLUCOSEU Negative 05/06/2014 2100   HGBUR NEGATIVE 02/21/2017 1050   BILIRUBINUR NEGATIVE 02/21/2017 1050   BILIRUBINUR Negative 05/06/2014 2100   KETONESUR NEGATIVE 02/21/2017 1050   PROTEINUR  NEGATIVE 02/21/2017 1050   NITRITE NEGATIVE 02/21/2017 [place] 02/21/2017 1050   LEUKOCYTESUR Negative 05/06/2014 2100    Lab Results  Component Value Date   BACTERIA NONE SEEN 02/21/2017    Pertinent Imaging: CT, ultrasound reviewed images.  Reviewed referring office notes. No results found for this or any previous visit. No results found for this or any previous visit. No results found for this or any previous visit. No results found for this or any previous visit. No results found for this or any previous visit. No results found for this or any previous visit. No results found for this or any previous visit. No results found for this or any previous visit.  Assessment & Plan:    1. Urinary incontinence, unspecified type - sounds like urgency when he stands to void. PVR normal. Continue surveillance. Discussed nature r/b of anticholinergics and myrbetriq but they want to avoid side effects. Discussed timed voiding.   - Urinalysis, Complete - Bladder Scan (Post Void Residual) in office  2. BPH with luts - discussed the nature r/b/a to 5ari and FDA warnings. They would like to continue finasteride and it was refilled.   See 1 year or sooner if issues.    No follow-ups on file.  Festus Aloe, MD  [facility] [address], [place], [place] [address] [phone]

## 2017-09-15 ENCOUNTER — Emergency Department: Payer: Medicare Other

## 2017-09-15 ENCOUNTER — Emergency Department
Admission: EM | Admit: 2017-09-15 | Discharge: 2017-09-15 | Disposition: A | Discharge status: Home / Self Care | Payer: Medicare Other | Attending: Emergency Medicine | Admitting: Emergency Medicine

## 2017-09-15 ENCOUNTER — Other Ambulatory Visit: Payer: Self-pay

## 2017-09-15 DIAGNOSIS — Y999 Unspecified external cause status: Secondary | ICD-10-CM | POA: Insufficient documentation

## 2017-09-15 DIAGNOSIS — E119 Type 2 diabetes mellitus without complications: Secondary | ICD-10-CM | POA: Diagnosis not present

## 2017-09-15 DIAGNOSIS — I2510 Atherosclerotic heart disease of native coronary artery without angina pectoris: Secondary | ICD-10-CM | POA: Insufficient documentation

## 2017-09-15 DIAGNOSIS — Y92003 Bedroom of unspecified non-institutional (private) residence as the place of occurrence of the external cause: Secondary | ICD-10-CM | POA: Diagnosis not present

## 2017-09-15 DIAGNOSIS — Z79899 Other long term (current) drug therapy: Secondary | ICD-10-CM | POA: Insufficient documentation

## 2017-09-15 DIAGNOSIS — Z87891 Personal history of nicotine dependence: Secondary | ICD-10-CM | POA: Diagnosis not present

## 2017-09-15 DIAGNOSIS — W19XXXA Unspecified fall, initial encounter: Secondary | ICD-10-CM

## 2017-09-15 DIAGNOSIS — J45909 Unspecified asthma, uncomplicated: Secondary | ICD-10-CM | POA: Diagnosis not present

## 2017-09-15 DIAGNOSIS — W06XXXA Fall from bed, initial encounter: Secondary | ICD-10-CM | POA: Diagnosis not present

## 2017-09-15 DIAGNOSIS — S161XXA Strain of muscle, fascia and tendon at neck level, initial encounter: Secondary | ICD-10-CM | POA: Diagnosis not present

## 2017-09-15 DIAGNOSIS — I5032 Chronic diastolic (congestive) heart failure: Secondary | ICD-10-CM | POA: Insufficient documentation

## 2017-09-15 DIAGNOSIS — S199XXA Unspecified injury of neck, initial encounter: Secondary | ICD-10-CM | POA: Diagnosis present

## 2017-09-15 DIAGNOSIS — Y9389 Activity, other specified: Secondary | ICD-10-CM | POA: Insufficient documentation

## 2017-09-15 DIAGNOSIS — R51 Headache: Secondary | ICD-10-CM | POA: Insufficient documentation

## 2017-09-15 DIAGNOSIS — E039 Hypothyroidism, unspecified: Secondary | ICD-10-CM | POA: Diagnosis not present

## 2017-09-15 DIAGNOSIS — I110 Hypertensive heart disease with heart failure: Secondary | ICD-10-CM | POA: Diagnosis not present

## 2017-09-15 NOTE — ED Provider Notes (Signed)
[facility] Emergency Department Provider Note  ____________________________________________   First MD Initiated Contact with Patient 09/15/17 1106     (approximate)  I have reviewed the triage vital signs and the nursing notes.   HISTORY  Chief Complaint Fall  History is somewhat limited as the patient does not speak [place] and family at bedside is translating  HPI Derek Shelton is a 77 y.o. male comes to the emergency department with family after eating on the edge of his bed leaning forward and falling onto the ground striking his head this morning.  Family became concerned because the patient had a headache and neck pain and he is currently taking Eliquis.  His pain is primarily in his right upper neck.  He has no wrist pain and no arm pain.  No leg pain.  No numbness or weakness.  Has loss of consciousness.  Has been a shortness of breath.  He not pass out.  Headache is mild sudden onset and slowly improving with time.   Past Medical History:  Diagnosis Date  . Anemia   . Asthma   . CHF (congestive heart failure) ([place])   . COPD (chronic obstructive pulmonary disease) ([place])   . Coronary artery disease   . Dementia    Per son's report  . Diabetes mellitus without complication ([place])   . Dysrhythmia    Atrial Fibrillation  . GERD (gastroesophageal reflux disease)   . Hyperlipidemia   . Hypertension   . Hypothyroidism   . Shortness of breath dyspnea   . Sleep apnea   . Thyroid disease     Patient Active Problem List   Diagnosis Date Noted  . A-fib ([place]) 05/22/2017  . Chest pain 02/21/2017  . AKI (acute kidney injury) ([place]) 02/21/2017  . SOB (shortness of breath) 02/08/2017  . Atrial fibrillation with RVR ([place]) 02/08/2017  . GI bleeding 08/06/2014  . Anemia 08/06/2014  . Bradycardia 08/06/2014  . Atrial fibrillation ([place]) 08/06/2014  . Hyponatremia 08/06/2014  . Chronic diastolic heart failure ([place]) 08/06/2014  . Diabetes ([place])  08/06/2014  . OSA (obstructive sleep apnea) 08/06/2014    Past Surgical History:  Procedure Laterality Date  . CARDIAC CATHETERIZATION    . COLONOSCOPY N/A 08/10/2014   Procedure: COLONOSCOPY;  Surgeon: Manya Silvas, MD;  Location: [facility] ENDOSCOPY;  Service: Endoscopy;  Laterality: N/A;  . COLONOSCOPY WITH PROPOFOL N/A 04/05/2016   Procedure: COLONOSCOPY WITH PROPOFOL;  Surgeon: Lollie Sails, MD;  Location: [facility] ENDOSCOPY;  Service: Endoscopy;  Laterality: N/A;  . ESOPHAGOGASTRODUODENOSCOPY N/A 08/08/2014   Procedure: ESOPHAGOGASTRODUODENOSCOPY (EGD);  Surgeon: Manya Silvas, MD;  Location: [facility] ENDOSCOPY;  Service: Endoscopy;  Laterality: N/A;  plan for early afternoon  . EYE SURGERY    . GIVENS CAPSULE STUDY N/A 08/12/2014   Procedure: GIVENS CAPSULE STUDY;  Surgeon: Manya Silvas, MD;  Location: [facility] ENDOSCOPY;  Service: Endoscopy;  Laterality: N/A;  . rectal fistula N/A     Prior to Admission medications   Medication Sig Start Date End Date Taking? Authorizing Provider  apixaban (ELIQUIS) 5 MG TABS tablet Take 1 tablet (5 mg total) by mouth 2 (two) times daily. 05/23/17   [name], MD  benazepril (LOTENSIN) 10 MG tablet Take 10 mg by mouth daily.    [provider]  budesonide-formoterol (SYMBICORT) 160-4.5 MCG/ACT inhaler Inhale 2 puffs into the lungs 2 (two) times daily.    [provider]  digoxin (LANOXIN) 0.25 MG tablet Take 1 tablet (0.25  mg total) by mouth daily. 05/23/17   [name], MD  donepezil (ARICEPT) 5 MG tablet Take 5 mg by mouth at bedtime.    [provider]  DULoxetine (CYMBALTA) 60 MG capsule Take 60 mg by mouth daily.    [provider]  esomeprazole (NEXIUM) 40 MG capsule Take 40 mg by mouth daily at 12 noon.    [provider]  ferrous sulfate 325 (65 FE) MG EC tablet Take 325 mg by mouth daily.    [provider]  finasteride (PROSCAR) 5 MG tablet Take 1 tablet (5 mg total) by mouth daily.  05/29/17 05/29/18  Festus Aloe, MD  furosemide (LASIX) 20 MG tablet Take 20 mg by mouth 2 (two) times daily.    [provider]  glimepiride (AMARYL) 1 MG tablet Take 1 tablet (1 mg total) by mouth daily with breakfast. 02/11/17   [name], MD  guaiFENesin-dextromethorphan (ROBITUSSIN DM) 100-10 MG/5ML syrup Take 5 mLs by mouth every 4 (four) hours as needed for cough. 05/23/17   [name], MD  ipratropium-albuterol (DUONEB) 0.5-2.5 (3) MG/3ML SOLN Inhale 3 mLs into the lungs every 6 (six) hours as needed for shortness of breath. 01/22/17   [provider]  levothyroxine (SYNTHROID, LEVOTHROID) 112 MCG tablet Take 112 mcg by mouth daily before breakfast.     [provider]  Melatonin 3 MG TABS Take 3 mg by mouth at bedtime as needed (sleep).     [provider]  metoprolol tartrate (LOPRESSOR) 100 MG tablet Take 1 tablet (100 mg total) by mouth 2 (two) times daily. 02/11/17   Wieting, Richard, MD  omega-3 acid ethyl esters (LOVAZA) 1 G capsule Take 2 g by mouth 2 (two) times daily.     [provider]    Allergies Catapres [clonidine hcl]; Coreg [carvedilol]; and Penicillins  Family History  Problem Relation Age of Onset  . Diabetes Mother     Social History Social History   Tobacco Use  . Smoking status: Former Research scientist (life sciences)  . Smokeless tobacco: Current User    Types: Chew  Substance Use Topics  . Alcohol use: No  . Drug use: No    Review of Systems Constitutional: No fever/chills Eyes: No visual changes. ENT: Positive for neck pain Cardiovascular: Denies chest pain. Respiratory: Denies shortness of breath. Gastrointestinal: No abdominal pain.  No nausea, no vomiting.  No diarrhea.  No constipation. Genitourinary: Negative for dysuria. Musculoskeletal: Negative for back pain. Skin: Negative for rash. Neurological: Positive for headache   ____________________________________________   PHYSICAL EXAM:  VITAL SIGNS: ED  Triage Vitals  Enc Vitals Group     BP 09/15/17 0933 (!) 133/96     Pulse Rate 09/15/17 0933 80     Resp 09/15/17 0933 15     Temp 09/15/17 0933 98.4 F (36.9 C)     Temp Source 09/15/17 0933 Oral     SpO2 09/15/17 0933 93 %     Weight 09/15/17 0937 195 lb (88.5 kg)     Height 09/15/17 0937 5\' 6"  (1.676 m)     Head Circumference --      Peak Flow --      Pain Score 09/15/17 0937 5     Pain Loc --      Pain Edu? --      Excl. in [place]? --     Constitutional: Pleasant cooperative speaks in full clear sentences holding his right neck and appears somewhat uncomfortable Eyes: PERRL EOMI. Head: Atraumatic.  Nose: No congestion/rhinnorhea. Mouth/Throat: No trismus Neck: No stridor.  No midline tenderness or step-offs Cardiovascular: Irregularly irregular although regular rate Respiratory: Normal respiratory effort.  No retractions. Lungs CTAB and moving good air Gastrointestinal: Soft nontender Musculoskeletal: No lower extremity edema   Neurologic:  Normal speech and language. No gross focal neurologic deficits are appreciated. Skin:  Skin is warm, dry and intact. No rash noted. Psychiatric: Mood and affect are normal. Speech and behavior are normal.    ____________________________________________   DIFFERENTIAL includes but not limited to  Intracerebral hemorrhage, cervical spine fracture, central cord syndrome ____________________________________________   LABS (all labs ordered are listed, but only abnormal results are displayed)  Labs Reviewed - No data to display   __________________________________________  EKG   ____________________________________________  RADIOLOGY  CT scan of the head neck reviewed by me with chronic changes but no acute disease ____________________________________________   PROCEDURES  Procedure(s) performed: no  Procedures  Critical Care performed: no  ____________________________________________   INITIAL IMPRESSION /  ASSESSMENT AND PLAN / ED COURSE  Pertinent labs & imaging results that were available during my care of the patient were reviewed by me and considered in my medical decision making (see chart for details).   As part of my medical decision making, I reviewed the following data within the [place] History obtained from family if available, nursing notes, old chart and ekg, as well as notes from prior ED visits.  The patient arrives neuro intact after mechanical fall and a hyperextension injury.  Do not suspect central cord.  Declines pain medication at this point.  Advanced imaging is negative for acute pathology and the patient feels reassured.  Remains neuro intact.  Stable for outpatient management.      ____________________________________________   FINAL CLINICAL IMPRESSION(S) / ED DIAGNOSES  Final diagnoses:  Fall, initial encounter  Strain of neck muscle, initial encounter      NEW MEDICATIONS STARTED DURING THIS VISIT:  New Prescriptions   No medications on file     Note:  This document was prepared using Dragon voice recognition software and may include unintentional dictation errors.     Darel Hong, MD 09/15/17 1241

## 2017-09-15 NOTE — Discharge Instructions (Signed)
Fortunately today your CT scans are reassuring and it does not look like you are bleeding in your brain and you have not broken your neck.  Please follow-up with your primary care physician as needed and return to the emergency department for any concerns.  It was a pleasure to take care of you today, and thank you for coming to our emergency department.  If you have any questions or concerns before leaving please ask the nurse to grab me and I'm more than happy to go through your aftercare instructions again.  If you were prescribed any opioid pain medication today such as Norco, Vicodin, Percocet, morphine, hydrocodone, or oxycodone please make sure you do not drive when you are taking this medication as it can alter your ability to drive safely.  If you have any concerns once you are home that you are not improving or are in fact getting worse before you can make it to your follow-up appointment, please do not hesitate to call 911 and come back for further evaluation.  Darel Hong, MD  Results for orders placed or performed in visit on 05/29/17  Urinalysis, Complete  Result Value Ref Range   Specific Gravity, UA 1.010 1.005 - 1.030   pH, UA 7.0 5.0 - 7.5   Color, UA Yellow Yellow   Appearance Ur Clear Clear   Leukocytes, UA Negative Negative   Protein, UA Negative Negative/Trace   Glucose, UA Negative Negative   Ketones, UA Negative Negative   RBC, UA Negative Negative   Bilirubin, UA Negative Negative   Urobilinogen, Ur 1.0 0.2 - 1.0 mg/dL   Nitrite, UA Negative Negative  Bladder Scan (Post Void Residual) in office  Result Value Ref Range   Scan Result 81    Ct Head Wo Contrast  Result Date: 09/15/2017 CLINICAL DATA:  Neck pain after fall today. No loss of consciousness. Ataxia. EXAM: CT HEAD WITHOUT CONTRAST CT CERVICAL SPINE WITHOUT CONTRAST TECHNIQUE: Multidetector CT imaging of the head and cervical spine was performed following the standard protocol without intravenous  contrast. Multiplanar CT image reconstructions of the cervical spine were also generated. COMPARISON:  CT and MRI scan of May 26, 2017. FINDINGS: CT HEAD FINDINGS Brain: Stable size and appearance of mass in left middle cranial fossa consistent with meningioma as described on prior MRI exam. Mild chronic ischemic white matter disease is noted. No midline shift is noted. Ventricular size is within normal limits. There is no evidence of hemorrhage or acute infarction. Vascular: No hyperdense vessel or unexpected calcification. Skull: Normal. Negative for fracture or focal lesion. Sinuses/Orbits: No acute finding. Other: None. CT CERVICAL SPINE FINDINGS Alignment: Normal. Skull base and vertebrae: No acute fracture. No primary bone lesion or focal pathologic process. Soft tissues and spinal canal: No prevertebral fluid or swelling. No visible canal hematoma. Disc levels: Severe degenerative disc disease is noted at C3-4, C4-5, C5-6, C6-7 and C7-T1. Large osteophyte formation is noted anteriorly at all levels, with bridging osteophyte formation seen at C5-6, C6-7 C7-T1 and T1-2. Upper chest: Negative. Other: Degenerative changes are seen involving the posterior facet joints bilaterally. IMPRESSION: Stable left middle cranial fossa is noted most consistent with meningioma as described on prior MRI. Mild chronic ischemic white matter disease is noted. No acute intracranial abnormality is noted. 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 No acute fracture or spondylolisthesis is noted. Electronically Signed   By: Marijo Conception, M.D.   On: 09/15/2017 12:34   Ct Cervical  Spine Wo Contrast  Result Date: 09/15/2017 CLINICAL DATA:  Neck pain after fall today. No loss of consciousness. Ataxia. EXAM: CT HEAD WITHOUT CONTRAST CT CERVICAL SPINE WITHOUT CONTRAST TECHNIQUE: Multidetector CT imaging of the head and cervical spine was performed following the standard protocol without intravenous  contrast. Multiplanar CT image reconstructions of the cervical spine were also generated. COMPARISON:  CT and MRI scan of May 26, 2017. FINDINGS: CT HEAD FINDINGS Brain: Stable size and appearance of mass in left middle cranial fossa consistent with meningioma as described on prior MRI exam. Mild chronic ischemic white matter disease is noted. No midline shift is noted. Ventricular size is within normal limits. There is no evidence of hemorrhage or acute infarction. Vascular: No hyperdense vessel or unexpected calcification. Skull: Normal. Negative for fracture or focal lesion. Sinuses/Orbits: No acute finding. Other: None. CT CERVICAL SPINE FINDINGS Alignment: Normal. Skull base and vertebrae: No acute fracture. No primary bone lesion or focal pathologic process. Soft tissues and spinal canal: No prevertebral fluid or swelling. No visible canal hematoma. Disc levels: Severe degenerative disc disease is noted at C3-4, C4-5, C5-6, C6-7 and C7-T1. Large osteophyte formation is noted anteriorly at all levels, with bridging osteophyte formation seen at C5-6, C6-7 C7-T1 and T1-2. Upper chest: Negative. Other: Degenerative changes are seen involving the posterior facet joints bilaterally. IMPRESSION: Stable left middle cranial fossa is noted most consistent with meningioma as described on prior MRI. Mild chronic ischemic white matter disease is noted. No acute intracranial abnormality is noted. 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 No acute fracture or spondylolisthesis is noted. Electronically Signed   By: Marijo Conception, M.D.   On: 09/15/2017 12:34

## 2017-09-15 NOTE — ED Triage Notes (Signed)
Pt fell out of bed this am, denies loc, pt c/o neck pain, son states that he has dementia and fell twice last week also, pt is on a blood thinner, and son thinks as he was attempting to get out of bed he fell forward. c-collar applied in triage

## 2017-09-15 NOTE — ED Notes (Signed)
Pt remains in c-collar. Tenderness to palpation to posterior neck.

## 2017-09-15 NOTE — ED Notes (Signed)
Report to Jessica, RN

## 2017-09-15 NOTE — ED Notes (Addendum)
[phone] phone number of son.   Pt takes Eliquis. No reported LOC.  Pt family member states that patient fell onto carpeted floor from sitting on side of bed.

## 2017-11-28 DIAGNOSIS — R55 Syncope and collapse: Secondary | ICD-10-CM | POA: Insufficient documentation

## 2017-11-29 DIAGNOSIS — I10 Essential (primary) hypertension: Secondary | ICD-10-CM | POA: Insufficient documentation

## 2017-11-29 DIAGNOSIS — E162 Hypoglycemia, unspecified: Secondary | ICD-10-CM | POA: Insufficient documentation

## 2017-12-02 ENCOUNTER — Inpatient Hospital Stay: Payer: Medicare Other

## 2017-12-02 ENCOUNTER — Inpatient Hospital Stay
Admission: EM | Admit: 2017-12-02 | Discharge: 2017-12-08 | DRG: 065 | Disposition: A | Discharge status: Skilled Nursing Facility | Payer: Medicare Other | Attending: Internal Medicine | Admitting: Internal Medicine

## 2017-12-02 ENCOUNTER — Emergency Department: Payer: Medicare Other

## 2017-12-02 ENCOUNTER — Encounter: Payer: Self-pay | Admitting: Emergency Medicine

## 2017-12-02 ENCOUNTER — Other Ambulatory Visit: Payer: Self-pay

## 2017-12-02 DIAGNOSIS — Z79899 Other long term (current) drug therapy: Secondary | ICD-10-CM | POA: Diagnosis not present

## 2017-12-02 DIAGNOSIS — I495 Sick sinus syndrome: Secondary | ICD-10-CM | POA: Diagnosis present

## 2017-12-02 DIAGNOSIS — I480 Paroxysmal atrial fibrillation: Secondary | ICD-10-CM | POA: Diagnosis present

## 2017-12-02 DIAGNOSIS — M1712 Unilateral primary osteoarthritis, left knee: Secondary | ICD-10-CM

## 2017-12-02 DIAGNOSIS — R29704 NIHSS score 4: Secondary | ICD-10-CM | POA: Diagnosis present

## 2017-12-02 DIAGNOSIS — R296 Repeated falls: Secondary | ICD-10-CM | POA: Diagnosis present

## 2017-12-02 DIAGNOSIS — E785 Hyperlipidemia, unspecified: Secondary | ICD-10-CM | POA: Diagnosis present

## 2017-12-02 DIAGNOSIS — J441 Chronic obstructive pulmonary disease with (acute) exacerbation: Secondary | ICD-10-CM | POA: Diagnosis not present

## 2017-12-02 DIAGNOSIS — I110 Hypertensive heart disease with heart failure: Secondary | ICD-10-CM | POA: Diagnosis present

## 2017-12-02 DIAGNOSIS — K219 Gastro-esophageal reflux disease without esophagitis: Secondary | ICD-10-CM | POA: Diagnosis present

## 2017-12-02 DIAGNOSIS — Z880 Allergy status to penicillin: Secondary | ICD-10-CM

## 2017-12-02 DIAGNOSIS — Z7901 Long term (current) use of anticoagulants: Secondary | ICD-10-CM | POA: Diagnosis not present

## 2017-12-02 DIAGNOSIS — F0390 Unspecified dementia without behavioral disturbance: Secondary | ICD-10-CM | POA: Diagnosis present

## 2017-12-02 DIAGNOSIS — I5032 Chronic diastolic (congestive) heart failure: Secondary | ICD-10-CM | POA: Diagnosis present

## 2017-12-02 DIAGNOSIS — Z888 Allergy status to other drugs, medicaments and biological substances status: Secondary | ICD-10-CM | POA: Diagnosis not present

## 2017-12-02 DIAGNOSIS — I639 Cerebral infarction, unspecified: Secondary | ICD-10-CM | POA: Diagnosis present

## 2017-12-02 DIAGNOSIS — E119 Type 2 diabetes mellitus without complications: Secondary | ICD-10-CM | POA: Diagnosis present

## 2017-12-02 DIAGNOSIS — W19XXXA Unspecified fall, initial encounter: Secondary | ICD-10-CM

## 2017-12-02 DIAGNOSIS — F17220 Nicotine dependence, chewing tobacco, uncomplicated: Secondary | ICD-10-CM | POA: Diagnosis present

## 2017-12-02 DIAGNOSIS — R278 Other lack of coordination: Secondary | ICD-10-CM | POA: Diagnosis present

## 2017-12-02 DIAGNOSIS — I959 Hypotension, unspecified: Secondary | ICD-10-CM | POA: Diagnosis not present

## 2017-12-02 DIAGNOSIS — Z833 Family history of diabetes mellitus: Secondary | ICD-10-CM | POA: Diagnosis not present

## 2017-12-02 DIAGNOSIS — R062 Wheezing: Secondary | ICD-10-CM

## 2017-12-02 DIAGNOSIS — Z79890 Hormone replacement therapy (postmenopausal): Secondary | ICD-10-CM | POA: Diagnosis not present

## 2017-12-02 DIAGNOSIS — G4733 Obstructive sleep apnea (adult) (pediatric): Secondary | ICD-10-CM | POA: Diagnosis present

## 2017-12-02 DIAGNOSIS — Z7951 Long term (current) use of inhaled steroids: Secondary | ICD-10-CM

## 2017-12-02 DIAGNOSIS — M170 Bilateral primary osteoarthritis of knee: Secondary | ICD-10-CM | POA: Diagnosis present

## 2017-12-02 DIAGNOSIS — Z7984 Long term (current) use of oral hypoglycemic drugs: Secondary | ICD-10-CM

## 2017-12-02 DIAGNOSIS — E039 Hypothyroidism, unspecified: Secondary | ICD-10-CM | POA: Diagnosis present

## 2017-12-02 DIAGNOSIS — I2510 Atherosclerotic heart disease of native coronary artery without angina pectoris: Secondary | ICD-10-CM | POA: Diagnosis present

## 2017-12-02 DIAGNOSIS — I6340 Cerebral infarction due to embolism of unspecified cerebral artery: Secondary | ICD-10-CM | POA: Diagnosis present

## 2017-12-02 LAB — BASIC METABOLIC PANEL
Anion gap: 10 (ref 5–15)
BUN: 11 mg/dL (ref 8–23)
CHLORIDE: 96 mmol/L — AB (ref 98–111)
CO2: 30 mmol/L (ref 22–32)
CREATININE: 0.94 mg/dL (ref 0.61–1.24)
Calcium: 9.3 mg/dL (ref 8.9–10.3)
GFR calc Af Amer: >60 mL/min (ref >60)
GFR calc non Af Amer: >60 mL/min (ref >60)
GLUCOSE: 242 mg/dL — AB (ref 70–99)
Potassium: 3.6 mmol/L (ref 3.5–5.1)
Sodium: 136 mmol/L (ref 135–145)

## 2017-12-02 LAB — CBC
HEMATOCRIT: 48.6 % (ref 39.0–52.0)
Hemoglobin: 16.1 g/dL (ref 13.0–17.0)
MCH: 29.4 pg (ref 26.0–34.0)
MCHC: 33.1 g/dL (ref 30.0–36.0)
MCV: 88.7 fL (ref 80.0–100.0)
Platelets: 200 10*3/uL (ref 150–400)
RBC: 5.48 MIL/uL (ref 4.22–5.81)
RDW: 13.2 % (ref 11.5–15.5)
WBC: 7.7 10*3/uL (ref 4.0–10.5)
nRBC: 0 % (ref 0.0–0.2)

## 2017-12-02 LAB — GLUCOSE, CAPILLARY: GLUCOSE-CAPILLARY: 187 mg/dL — AB (ref 70–99)

## 2017-12-02 LAB — TROPONIN I: Troponin I: <0.03 ng/mL (ref <0.03)

## 2017-12-02 MED ORDER — ACETAMINOPHEN 160 MG/5ML PO SOLN
650.0000 mg | ORAL | Status: DC | PRN
  Filled 2017-12-02: qty 20.3

## 2017-12-02 MED ORDER — DOCUSATE SODIUM 100 MG PO CAPS
100.0000 mg | ORAL_CAPSULE | Freq: Two times a day (BID) | ORAL | Status: DC | PRN
  Administered 2017-12-04 – 2017-12-05 (×2): 100 mg via ORAL
  Filled 2017-12-02 (×2): qty 1

## 2017-12-02 MED ORDER — MELATONIN 5 MG PO TABS
2.5000 mg | ORAL_TABLET | Freq: Every evening | ORAL | Status: DC | PRN
  Filled 2017-12-02: qty 0.5

## 2017-12-02 MED ORDER — DULOXETINE HCL 30 MG PO CPEP
60.0000 mg | ORAL_CAPSULE | Freq: Every day | ORAL | Status: DC
  Administered 2017-12-03 – 2017-12-08 (×6): 60 mg via ORAL
  Filled 2017-12-02 (×6): qty 2

## 2017-12-02 MED ORDER — FUROSEMIDE 20 MG PO TABS
20.0000 mg | ORAL_TABLET | Freq: Two times a day (BID) | ORAL | Status: DC
  Administered 2017-12-03 – 2017-12-04 (×4): 20 mg via ORAL
  Filled 2017-12-02 (×4): qty 1

## 2017-12-02 MED ORDER — PANTOPRAZOLE SODIUM 40 MG PO TBEC
40.0000 mg | DELAYED_RELEASE_TABLET | Freq: Every day | ORAL | Status: DC
  Administered 2017-12-03 – 2017-12-08 (×6): 40 mg via ORAL
  Filled 2017-12-02 (×6): qty 1

## 2017-12-02 MED ORDER — ACETAMINOPHEN 325 MG PO TABS: 650.0000 mg | ORAL_TABLET | ORAL | Status: DC | PRN

## 2017-12-02 MED ORDER — ACETAMINOPHEN 650 MG RE SUPP: 650.0000 mg | RECTAL | Status: DC | PRN

## 2017-12-02 MED ORDER — METOPROLOL TARTRATE 50 MG PO TABS: 100.0000 mg | ORAL_TABLET | Freq: Two times a day (BID) | ORAL | Status: DC

## 2017-12-02 MED ORDER — APIXABAN 5 MG PO TABS
5.0000 mg | ORAL_TABLET | Freq: Two times a day (BID) | ORAL | Status: DC
  Administered 2017-12-02 – 2017-12-04 (×4): 5 mg via ORAL
  Filled 2017-12-02 (×4): qty 1

## 2017-12-02 MED ORDER — OMEGA-3-ACID ETHYL ESTERS 1 G PO CAPS
2.0000 g | ORAL_CAPSULE | Freq: Two times a day (BID) | ORAL | Status: DC
  Administered 2017-12-02 – 2017-12-08 (×12): 2 g via ORAL
  Filled 2017-12-02 (×12): qty 2

## 2017-12-02 MED ORDER — DONEPEZIL HCL 5 MG PO TABS
5.0000 mg | ORAL_TABLET | Freq: Every day | ORAL | Status: DC
  Administered 2017-12-02 – 2017-12-07 (×6): 5 mg via ORAL
  Filled 2017-12-02 (×8): qty 1

## 2017-12-02 MED ORDER — SODIUM CHLORIDE 0.9 % IV SOLN
INTRAVENOUS | Status: DC
  Administered 2017-12-02: 19:00:00 via INTRAVENOUS

## 2017-12-02 MED ORDER — IPRATROPIUM-ALBUTEROL 0.5-2.5 (3) MG/3ML IN SOLN
3.0000 mL | Freq: Four times a day (QID) | RESPIRATORY_TRACT | Status: DC | PRN
  Administered 2017-12-04 – 2017-12-05 (×3): 3 mL via RESPIRATORY_TRACT
  Filled 2017-12-02 (×3): qty 3

## 2017-12-02 MED ORDER — GUAIFENESIN-DM 100-10 MG/5ML PO SYRP
5.0000 mL | ORAL_SOLUTION | ORAL | Status: DC | PRN
  Filled 2017-12-02: qty 5

## 2017-12-02 MED ORDER — STROKE: EARLY STAGES OF RECOVERY BOOK
Freq: Once | Status: AC
  Administered 2017-12-02: 19:00:00

## 2017-12-02 MED ORDER — LEVOTHYROXINE SODIUM 112 MCG PO TABS
112.0000 ug | ORAL_TABLET | Freq: Every day | ORAL | Status: DC
  Administered 2017-12-04 – 2017-12-08 (×5): 112 ug via ORAL
  Filled 2017-12-02 (×7): qty 1

## 2017-12-02 MED ORDER — GLIMEPIRIDE 1 MG PO TABS
1.0000 mg | ORAL_TABLET | Freq: Every day | ORAL | Status: DC
  Filled 2017-12-02: qty 1

## 2017-12-02 MED ORDER — MOMETASONE FURO-FORMOTEROL FUM 200-5 MCG/ACT IN AERO
2.0000 | INHALATION_SPRAY | Freq: Two times a day (BID) | RESPIRATORY_TRACT | Status: DC
  Administered 2017-12-03 – 2017-12-08 (×10): 2 via RESPIRATORY_TRACT
  Filled 2017-12-02 (×2): qty 8.8

## 2017-12-02 MED ORDER — INSULIN ASPART 100 UNIT/ML ~~LOC~~ SOLN
0.0000 [IU] | Freq: Three times a day (TID) | SUBCUTANEOUS | Status: DC
  Administered 2017-12-02: 2 [IU] via SUBCUTANEOUS
  Administered 2017-12-03: 1 [IU] via SUBCUTANEOUS
  Administered 2017-12-03: 3 [IU] via SUBCUTANEOUS
  Administered 2017-12-03 – 2017-12-04 (×4): 2 [IU] via SUBCUTANEOUS
  Administered 2017-12-05: 3 [IU] via SUBCUTANEOUS
  Administered 2017-12-05: 2 [IU] via SUBCUTANEOUS
  Administered 2017-12-05: 7 [IU] via SUBCUTANEOUS
  Administered 2017-12-06: 9 [IU] via SUBCUTANEOUS
  Administered 2017-12-06: 7 [IU] via SUBCUTANEOUS
  Administered 2017-12-06: 5 [IU] via SUBCUTANEOUS
  Administered 2017-12-07 (×2): 7 [IU] via SUBCUTANEOUS
  Administered 2017-12-07 – 2017-12-08 (×2): 5 [IU] via SUBCUTANEOUS
  Administered 2017-12-08: 3 [IU] via SUBCUTANEOUS
  Filled 2017-12-02 (×17): qty 1

## 2017-12-02 MED ORDER — FINASTERIDE 5 MG PO TABS
5.0000 mg | ORAL_TABLET | Freq: Every day | ORAL | Status: DC
  Administered 2017-12-03 – 2017-12-08 (×6): 5 mg via ORAL
  Filled 2017-12-02 (×7): qty 1

## 2017-12-02 MED ORDER — FERROUS SULFATE 325 (65 FE) MG PO TABS
325.0000 mg | ORAL_TABLET | Freq: Every day | ORAL | Status: DC
  Administered 2017-12-03 – 2017-12-08 (×6): 325 mg via ORAL
  Filled 2017-12-02 (×6): qty 1

## 2017-12-02 MED ORDER — DIGOXIN 250 MCG PO TABS: 0.2500 mg | ORAL_TABLET | Freq: Every day | ORAL | Status: DC

## 2017-12-02 MED ORDER — SENNOSIDES-DOCUSATE SODIUM 8.6-50 MG PO TABS
1.0000 | ORAL_TABLET | Freq: Every evening | ORAL | Status: DC | PRN
  Administered 2017-12-05: 1 via ORAL
  Filled 2017-12-02: qty 1

## 2017-12-02 NOTE — ED Notes (Signed)
Family talking to MRI on the phone for pt. Pt doesn't speak much english.

## 2017-12-02 NOTE — ED Notes (Signed)
MD at bedside. 

## 2017-12-02 NOTE — ED Notes (Signed)
Pt returned from scans.  

## 2017-12-02 NOTE — ED Notes (Signed)
Pt returned from MRI via stretcher. Pt resting at this time.

## 2017-12-02 NOTE — ED Notes (Signed)
Pt being transported to MRI at this time by this tech.

## 2017-12-02 NOTE — ED Notes (Signed)
Pt taken to CT via stretcher.

## 2017-12-02 NOTE — Progress Notes (Signed)
Nurse called about sinus brady with some pauses. I will hold metoprolol and monitor on tele floor. Dr.Shaukat Humphrey Rolls is texted about the consult.

## 2017-12-02 NOTE — ED Notes (Signed)
[phone] Derek Shelton- son

## 2017-12-02 NOTE — ED Provider Notes (Signed)
[facility] Emergency Department Provider Note ____________________________________________   I have reviewed the triage vital signs and the triage nursing note.  HISTORY  Chief Complaint Fall and Leg Pain   Historian Level 5 Caveat History Limited by patient poor historian Son provides most the history, wife provides additional history  HPI Derek Shelton is a 77 y.o. male with a history of atrial fibrillation, history of dementia, lives at home with his wife, history of chronic arthritis and knee pain for which she receives cortisone injections on the left, presents to the ER with 2 falls yesterday and complains of left knee pain.  Patient has dementia and is also known to be "hard headed "per his family.  He was evaluated at hospital in [place] from Tuesday to Wednesday for altered mental status which was presumed to be due to hypoglycemic episode, and patient had what sounds like an extensive work-up including negative brain MRI, and although was unable to access the laboratory studies her urinalysis, son states this was ultimately negative.  Patient was apparently fine on Thursday the day after he came home, and then yesterday which was Friday he fell 2 times.  Both times he had stated that his knee was bothering him in terms of pain and that he had to get up to the bathroom to urinate and even though his wife asked him to hold on so that she could help him, he tried to get up on his own and then collapsed it sounds like due to left knee pain.  There is been no new knee injury.  No lower extremity swelling or calf pain.  No skin changes or skin rash.  In terms of the fall, wife thinks that he may have struck his head on the coffee table although is not complaining of headache at this point time.  Denies neck pain.  Today he is very sleepy.  Family states this is common that he will have one good day and then one day where he will be sleepy during the  day.  From the standpoint of the fall he had some discomfort to the right buttock, but he has been able to bear weight and walk.    Past Medical History:  Diagnosis Date  . Anemia   . Asthma   . CHF (congestive heart failure) ([place])   . COPD (chronic obstructive pulmonary disease) ([place])   . Coronary artery disease   . Dementia ([place])    Per son's report  . Diabetes mellitus without complication ([place])   . Dysrhythmia    Atrial Fibrillation  . GERD (gastroesophageal reflux disease)   . Hyperlipidemia   . Hypertension   . Hypothyroidism   . Shortness of breath dyspnea   . Sleep apnea   . Thyroid disease     Patient Active Problem List   Diagnosis Date Noted  . A-fib ([place]) 05/22/2017  . Chest pain 02/21/2017  . AKI (acute kidney injury) ([place]) 02/21/2017  . SOB (shortness of breath) 02/08/2017  . Atrial fibrillation with RVR ([place]) 02/08/2017  . GI bleeding 08/06/2014  . Anemia 08/06/2014  . Bradycardia 08/06/2014  . Atrial fibrillation ([place]) 08/06/2014  . Hyponatremia 08/06/2014  . Chronic diastolic heart failure ([place]) 08/06/2014  . Diabetes ([place]) 08/06/2014  . OSA (obstructive sleep apnea) 08/06/2014    Past Surgical History:  Procedure Laterality Date  . CARDIAC CATHETERIZATION    . COLONOSCOPY N/A 08/10/2014   Procedure: COLONOSCOPY;  Surgeon: Manya Silvas, MD;  Location:  [facility] ENDOSCOPY;  Service: Endoscopy;  Laterality: N/A;  . COLONOSCOPY WITH PROPOFOL N/A 04/05/2016   Procedure: COLONOSCOPY WITH PROPOFOL;  Surgeon: Lollie Sails, MD;  Location: [facility] ENDOSCOPY;  Service: Endoscopy;  Laterality: N/A;  . ESOPHAGOGASTRODUODENOSCOPY N/A 08/08/2014   Procedure: ESOPHAGOGASTRODUODENOSCOPY (EGD);  Surgeon: Manya Silvas, MD;  Location: [facility] ENDOSCOPY;  Service: Endoscopy;  Laterality: N/A;  plan for early afternoon  . EYE SURGERY    . GIVENS CAPSULE STUDY N/A 08/12/2014   Procedure: GIVENS CAPSULE STUDY;  Surgeon: Manya Silvas, MD;  Location: [facility] ENDOSCOPY;   Service: Endoscopy;  Laterality: N/A;  . rectal fistula N/A     Prior to Admission medications   Medication Sig Start Date End Date Taking? Authorizing Provider  apixaban (ELIQUIS) 5 MG TABS tablet Take 1 tablet (5 mg total) by mouth 2 (two) times daily. 05/23/17   [name], MD  benazepril (LOTENSIN) 10 MG tablet Take 10 mg by mouth daily.    [provider]  budesonide-formoterol (SYMBICORT) 160-4.5 MCG/ACT inhaler Inhale 2 puffs into the lungs 2 (two) times daily.    [provider]  digoxin (LANOXIN) 0.25 MG tablet Take 1 tablet (0.25 mg total) by mouth daily. 05/23/17   [name], MD  donepezil (ARICEPT) 5 MG tablet Take 5 mg by mouth at bedtime.    [provider]  DULoxetine (CYMBALTA) 60 MG capsule Take 60 mg by mouth daily.    [provider]  esomeprazole (NEXIUM) 40 MG capsule Take 40 mg by mouth daily at 12 noon.    [provider]  ferrous sulfate 325 (65 FE) MG EC tablet Take 325 mg by mouth daily.    [provider]  finasteride (PROSCAR) 5 MG tablet Take 1 tablet (5 mg total) by mouth daily. 05/29/17 05/29/18  Festus Aloe, MD  furosemide (LASIX) 20 MG tablet Take 20 mg by mouth 2 (two) times daily.    [provider]  glimepiride (AMARYL) 1 MG tablet Take 1 tablet (1 mg total) by mouth daily with breakfast. 02/11/17   [name], MD  guaiFENesin-dextromethorphan (ROBITUSSIN DM) 100-10 MG/5ML syrup Take 5 mLs by mouth every 4 (four) hours as needed for cough. 05/23/17   [name], MD  ipratropium-albuterol (DUONEB) 0.5-2.5 (3) MG/3ML SOLN Inhale 3 mLs into the lungs every 6 (six) hours as needed for shortness of breath. 01/22/17   [provider]  levothyroxine (SYNTHROID, LEVOTHROID) 112 MCG tablet Take 112 mcg by mouth daily before breakfast.     [provider]  Melatonin 3 MG TABS Take 3 mg by mouth at bedtime as needed (sleep).     [provider]  metoprolol tartrate  (LOPRESSOR) 100 MG tablet Take 1 tablet (100 mg total) by mouth 2 (two) times daily. 02/11/17   Wieting, Richard, MD  omega-3 acid ethyl esters (LOVAZA) 1 G capsule Take 2 g by mouth 2 (two) times daily.     [provider]    Allergies  Allergen Reactions  . Catapres [Clonidine Hcl]   . Coreg [Carvedilol]   . Penicillins     Family History  Problem Relation Age of Onset  . Diabetes Mother     Social History Social History   Tobacco Use  . Smoking status: Former Research scientist (life sciences)  . Smokeless tobacco: Current User    Types: Chew  Substance Use Topics  . Alcohol use: No  . Drug use: No    Review of Systems  Constitutional: Negative for any recent  fevers. Eyes: Negative for red eyes. ENT: Negative for sore throat. Cardiovascular: Negative for chest pain. Respiratory: Negative for shortness of breath. Gastrointestinal: Negative for abdominal pain, vomiting and diarrhea. Genitourinary: Negative for dysuria. Musculoskeletal: Negative for back pain. Skin: Negative for rash. Neurological: Negative for headache.  ____________________________________________   PHYSICAL EXAM:  VITAL SIGNS: ED Triage Vitals  Enc Vitals Group     BP 12/02/17 1201 131/85     Pulse Rate 12/02/17 1201 68     Resp 12/02/17 1201 10     Temp --      Temp Source 12/02/17 1201 Oral     SpO2 12/02/17 1201 94 %     Weight --      Height --      Head Circumference --      Peak Flow --      Pain Score 12/02/17 1202 0     Pain Loc --      Pain Edu? --      Excl. in [place]? --      Constitutional: Alert to voice, but then closes his eyes to go back to sleep.Marland Kitchen  HEENT      Head: Normocephalic and atraumatic.      Eyes: Conjunctivae are normal. Pupils equal and round.       Ears:         Nose: No congestion/rhinnorhea.      Mouth/Throat: Mucous membranes are moist.      Neck: No stridor. Cardiovascular/Chest: Normal rate, irregularly irregular rhythm.  No murmurs, rubs, or gallops. Respiratory:  Normal respiratory effort without tachypnea nor retractions. Breath sounds are clear and equal bilaterally. No wheezes/rales/rhonchi. Gastrointestinal: Soft. No distention, no guarding, no rebound. Nontender.    Genitourinary/rectal:Deferred Musculoskeletal: Pelvis stable.  No upper extremity pain or injury.  Had reported pain to the buttock, no hip pain with passive active range of motion.  No joint effusions.  Denies left knee pain for me on exam here.  No leg swelling or calf tenderness. Neurologic: No facial droop.  Normal speech and language. No gross or focal neurologic deficits are appreciated. Skin:  Skin is warm, dry and intact. No rash noted. Psychiatric: Calm and cooperative.  T.   ____________________________________________  LABS (pertinent positives/negatives) I, Lisa Roca, MD the attending physician have reviewed the labs noted below.  Labs Reviewed  BASIC METABOLIC PANEL - Abnormal; Notable for the following components:      Result Value   Chloride 96 (*)    Glucose, Bld 242 (*)    All other components within normal limits  CBC  TROPONIN I  URINALYSIS, COMPLETE (UACMP) WITH MICROSCOPIC    ____________________________________________    EKG I, Lisa Roca, MD, the attending physician have personally viewed and interpreted all ECGs.  86 bpm.  Atrial fibrillation.  Right bundle branch block.  Nonspecific T wave ____________________________________________  RADIOLOGY   CT head without contrast, radiologist report reviewed:IMPRESSION: 1. Mildly greater asymmetric focal hypodensity focally in the posterior limb of the right internal capsule compared to previous exam from 09/15/2017, subacute lacunar infarct is not excluded. This could be further assessed with MRI if clinically warranted. 2. No intracranial hemorrhage is observed. 3. Mass anteriorly in the middle cranial fossa appears unchanged from previous exams and is probably a meningioma. 4. Old healed  right inferior orbital wall fracture. 5. Atherosclerosis. 6. Periventricular white matter and corona radiata hypodensities favor chronic ischemic microvascular white matter disease.  MRI brain:IMPRESSION: MRI confirms acute infarction in the posterior limb  internal capsule on the right as was suggested by CT.  Chronic small-vessel ischemic changes elsewhere affecting the brain of a chronic nature.  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  Pelvis x-ray:  IMPRESSION: Negative.  Left knee x-rays:  IMPRESSION: Severe DJD.  No fracture, dislocation, or effusion. __________________________________________  PROCEDURES  Procedure(s) performed: None  Procedures  Critical Care performed: None   ____________________________________________  ED COURSE / ASSESSMENT AND PLAN  Pertinent labs & imaging results that were available during my care of the patient were reviewed by me and considered in my medical decision making (see chart for details).    Son brought his father back in for evaluation because of 2 falls yesterday.  It sounds like the etiology of the falls is patient's left knee pain that then buckles underneath him.  Does not sound like syncope.  Does not sound like any cardiac or neurologic or pulmonary emergency.  He does have dementia, so it sounds like he is not listening to them when they tell him to just wait and ask for help as they are willing to help him.  Today he seems sleepy, but he is arousable.  Family states this is fairly typical for certain days for him to be more sleepy during the day.  From a trauma perspective, we discussed obtaining head CT given the fact that he is on Eliquis and while a fall yesterday he may struck his head and he is a bit unreliable in terms of his dementia and sleepiness today.  Otherwise, I do not see evidence of concern for hip fracture or knee fracture, or DVT.  We will  obtain pelvis x-ray and left knee x-ray.  Discussed follow back up with orthopedics because perhaps risks versus benefit ratio has changed given multiple falls secondary to that left knee pain with known arthritis.  I have placed PT and social work consult to help evaluate next steps for patient in the home in terms of possible strengthening exercises.   CT head report indicates possible acute stroke, discussed with patient and family and sent for MRI.  MRI brain indicates confirmation of acute stroke.  Discussed with hospitalist for admission.   CONSULTATIONS:   Hospitalist for admission   Patient / Family / Caregiver informed of clinical course, medical decision-making process, and agree with plan.     ___________________________________________   FINAL CLINICAL IMPRESSION(S) / ED DIAGNOSES   Final diagnoses:  Fall, initial encounter  Arthritis of left knee  Acute cerebrovascular accident (CVA) ([place])      ___________________________________________         Note: This dictation was prepared with Sales executive. Any transcriptional errors that result from this process are unintentional    Lisa Roca, MD 12/02/17 1454

## 2017-12-02 NOTE — ED Notes (Signed)
Giver water, ok per Dr. Reita Cliche.

## 2017-12-02 NOTE — Progress Notes (Signed)
Chaplain consulted with nurse about inability to complete AD due to language barrier. (Urdu). Chaplain will leave AD with son for him to interpret and may complete outside   12/02/17 2000  Clinical Encounter Type  Visited With Health care provider  Visit Type Initial  Spiritual Encounters  Spiritual Needs Brochure

## 2017-12-02 NOTE — ED Notes (Signed)
Family at bedside. 

## 2017-12-02 NOTE — ED Notes (Signed)
Pt denies chest pain at this time but family states when he was checking in he stated he had a funny feeling in chest.   Recent admission for syncopal episode while sitting in a car.   Came to ED for L leg pain. Son states had a [place] 4 weeks ago, unsure if DVT or not. Legs are warm bilaterally. No edema noted.

## 2017-12-02 NOTE — ED Triage Notes (Addendum)
Pt to ED via POV with son who states that pt fell twice yesterday. Pt was hospitalized recently for syncopal episode. Pt was bradycardic in triage and was taken to treatment room 25

## 2017-12-02 NOTE — H&P (Signed)
[facility] [place] NAME: Derek Shelton    MR#:  [id number]  DATE OF BIRTH:  1940/04/21  DATE OF ADMISSION:  12/02/2017  PRIMARY CARE PHYSICIAN: Perrin Maltese, MD   REQUESTING/REFERRING PHYSICIAN: Reita Cliche  CHIEF COMPLAINT:   Chief Complaint  Patient presents with  . Fall  . Leg Pain    HISTORY OF PRESENT ILLNESS: Derek Shelton  is a 77 y.o. male with a known history of asthma, congestive heart failure, COPD, coronary artery disease, dementia, diabetes without complication, atrial fibrillation, hyperlipidemia, hypertension, hypothyroidism, sleep apnea-had episode of weakness on left side and confusion 3 days ago while out for shopping.  He was taken [facility]-where after MRI and echocardiogram found negative for acute stroke, suspected to have this episode due to hypoglycemia and sent home. Patient already takes Eliquis for his atrial fibrillation. He and family claims not missing any dose on that. Next day morning, which was 2 days ago he again have weakness which is now mainly on his left side, and he has significant imbalance while walking and had 2 falls since then. Concerned with this he was brought to emergency room today.  He also had complaint of left knee pain.  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 CT scan of the head was suspicious for a stroke and MRI was done which confirms having acute stroke so hospitalist service was advised to admit for further management  PAST MEDICAL HISTORY:   Past Medical History:  Diagnosis Date  . Anemia   . Asthma   . CHF (congestive heart failure) ([place])   . COPD (chronic obstructive pulmonary disease) ([place])   . Coronary artery disease   . Dementia ([place])    Per son's report  . Diabetes mellitus without complication ([place])   . Dysrhythmia    Atrial Fibrillation  . GERD (gastroesophageal reflux disease)   . Hyperlipidemia   .  Hypertension   . Hypothyroidism   . Shortness of breath dyspnea   . Sleep apnea   . Thyroid disease     PAST SURGICAL HISTORY:  Past Surgical History:  Procedure Laterality Date  . CARDIAC CATHETERIZATION    . COLONOSCOPY N/A 08/10/2014   Procedure: COLONOSCOPY;  Surgeon: Manya Silvas, MD;  Location: [facility] ENDOSCOPY;  Service: Endoscopy;  Laterality: N/A;  . COLONOSCOPY WITH PROPOFOL N/A 04/05/2016   Procedure: COLONOSCOPY WITH PROPOFOL;  Surgeon: Lollie Sails, MD;  Location: [facility] ENDOSCOPY;  Service: Endoscopy;  Laterality: N/A;  . ESOPHAGOGASTRODUODENOSCOPY N/A 08/08/2014   Procedure: ESOPHAGOGASTRODUODENOSCOPY (EGD);  Surgeon: Manya Silvas, MD;  Location: [facility] ENDOSCOPY;  Service: Endoscopy;  Laterality: N/A;  plan for early afternoon  . EYE SURGERY    . GIVENS CAPSULE STUDY N/A 08/12/2014   Procedure: GIVENS CAPSULE STUDY;  Surgeon: Manya Silvas, MD;  Location: [facility] ENDOSCOPY;  Service: Endoscopy;  Laterality: N/A;  . rectal fistula N/A     SOCIAL HISTORY:  Social History   Tobacco Use  . Smoking status: Former Research scientist (life sciences)  . Smokeless tobacco: Current User    Types: Chew  Substance Use Topics  . Alcohol use: No    FAMILY HISTORY:  Family History  Problem Relation Age of Onset  . Diabetes Mother     DRUG ALLERGIES:  Allergies  Allergen Reactions  . Catapres [Clonidine Hcl]   . Coreg [Carvedilol]   . Penicillins  REVIEW OF SYSTEMS:   CONSTITUTIONAL: No fever, fatigue or weakness.  EYES: No blurred or double vision.  EARS, NOSE, AND THROAT: No tinnitus or ear pain.  RESPIRATORY: No cough, shortness of breath, wheezing or hemoptysis.  CARDIOVASCULAR: No chest pain, orthopnea, edema.  GASTROINTESTINAL: No nausea, vomiting, diarrhea or abdominal pain.  GENITOURINARY: No dysuria, hematuria.  ENDOCRINE: No polyuria, nocturia,  HEMATOLOGY: No anemia, easy bruising or bleeding SKIN: No rash or lesion. MUSCULOSKELETAL: No joint pain or arthritis.    NEUROLOGIC: No tingling, numbness, left-sided  weakness.  PSYCHIATRY: No anxiety or depression.   MEDICATIONS AT HOME:  Prior to Admission medications   Medication Sig Start Date End Date Taking? Authorizing Provider  apixaban (ELIQUIS) 5 MG TABS tablet Take 1 tablet (5 mg total) by mouth 2 (two) times daily. 05/23/17   [name], MD  benazepril (LOTENSIN) 10 MG tablet Take 10 mg by mouth daily.    [provider]  budesonide-formoterol (SYMBICORT) 160-4.5 MCG/ACT inhaler Inhale 2 puffs into the lungs 2 (two) times daily.    [provider]  digoxin (LANOXIN) 0.25 MG tablet Take 1 tablet (0.25 mg total) by mouth daily. 05/23/17   [name], MD  donepezil (ARICEPT) 5 MG tablet Take 5 mg by mouth at bedtime.    [provider]  DULoxetine (CYMBALTA) 60 MG capsule Take 60 mg by mouth daily.    [provider]  esomeprazole (NEXIUM) 40 MG capsule Take 40 mg by mouth daily at 12 noon.    [provider]  ferrous sulfate 325 (65 FE) MG EC tablet Take 325 mg by mouth daily.    [provider]  finasteride (PROSCAR) 5 MG tablet Take 1 tablet (5 mg total) by mouth daily. 05/29/17 05/29/18  Festus Aloe, MD  furosemide (LASIX) 20 MG tablet Take 20 mg by mouth 2 (two) times daily.    [provider]  glimepiride (AMARYL) 1 MG tablet Take 1 tablet (1 mg total) by mouth daily with breakfast. 02/11/17   [name], MD  guaiFENesin-dextromethorphan (ROBITUSSIN DM) 100-10 MG/5ML syrup Take 5 mLs by mouth every 4 (four) hours as needed for cough. 05/23/17   [name], MD  ipratropium-albuterol (DUONEB) 0.5-2.5 (3) MG/3ML SOLN Inhale 3 mLs into the lungs every 6 (six) hours as needed for shortness of breath. 01/22/17   [provider]  levothyroxine (SYNTHROID, LEVOTHROID) 112 MCG tablet Take 112 mcg by mouth daily before breakfast.     [provider]  Melatonin 3 MG TABS Take 3 mg by mouth at bedtime as needed (sleep).      [provider]  metoprolol tartrate (LOPRESSOR) 100 MG tablet Take 1 tablet (100 mg total) by mouth 2 (two) times daily. 02/11/17   Wieting, Richard, MD  omega-3 acid ethyl esters (LOVAZA) 1 G capsule Take 2 g by mouth 2 (two) times daily.     [provider]      PHYSICAL EXAMINATION:   VITAL SIGNS: Blood pressure (!) 160/98, pulse 67, resp. rate 15, SpO2 100 %.  GENERAL:  77 y.o.-year-old patient lying in the bed with no acute distress.  EYES: Pupils equal, round, reactive to light and accommodation. No scleral icterus. Extraocular muscles intact.  HEENT: Head atraumatic, normocephalic. Oropharynx and nasopharynx clear.  NECK:  Supple, no jugular venous distention. No thyroid enlargement, no tenderness.  LUNGS: Normal breath sounds bilaterally, no wheezing, rales,rhonchi or crepitation. No use of accessory muscles of respiration.  CARDIOVASCULAR: S1, S2 normal. No murmurs,  rubs, or gallops.  ABDOMEN: Soft, nontender, nondistended. Bowel sounds present. No organomegaly or mass.  EXTREMITIES: No pedal edema, cyanosis, or clubbing.  NEUROLOGIC: Cranial nerves II through XII are intact. Muscle strength 5/5 in both upper and right lower extremities, 3/5 in left LL . Sensation intact. Gait not checked.  PSYCHIATRIC: The patient is alert and oriented x 3.  SKIN: No obvious rash, lesion, or ulcer.   LABORATORY PANEL:   CBC Recent Labs  Lab 12/02/17 1214  WBC 7.7  HGB 16.1  HCT 48.6  PLT 200  MCV 88.7  MCH 29.4  MCHC 33.1  RDW 13.2   ------------------------------------------------------------------------------------------------------------------  Chemistries  Recent Labs  Lab 12/02/17 1214  NA 136  K 3.6  CL 96*  CO2 30  GLUCOSE 242*  BUN 11  CREATININE 0.94  CALCIUM 9.3   ------------------------------------------------------------------------------------------------------------------ CrCl cannot be calculated (Unknown ideal  weight.). ------------------------------------------------------------------------------------------------------------------ No results for input(s): TSH, T4TOTAL, T3FREE, THYROIDAB in the last 72 hours.  Invalid input(s): FREET3   Coagulation profile No results for input(s): INR, PROTIME in the last 168 hours. ------------------------------------------------------------------------------------------------------------------- No results for input(s): DDIMER in the last 72 hours. -------------------------------------------------------------------------------------------------------------------  Cardiac Enzymes Recent Labs  Lab 12/02/17 1214  TROPONINI <0.03   ------------------------------------------------------------------------------------------------------------------ Invalid input(s): POCBNP  ---------------------------------------------------------------------------------------------------------------  Urinalysis    Component Value Date/Time   COLORURINE YELLOW (A) 02/21/2017 1050   APPEARANCEUR Clear 05/29/2017 1125   LABSPEC 1.003 (L) 02/21/2017 1050   LABSPEC 1.004 05/06/2014 2100   PHURINE 6.0 02/21/2017 1050   GLUCOSEU Negative 05/29/2017 1125   GLUCOSEU Negative 05/06/2014 2100   HGBUR NEGATIVE 02/21/2017 1050   BILIRUBINUR Negative 05/29/2017 1125   BILIRUBINUR Negative 05/06/2014 2100   KETONESUR NEGATIVE 02/21/2017 1050   PROTEINUR Negative 05/29/2017 1125   PROTEINUR NEGATIVE 02/21/2017 1050   NITRITE Negative 05/29/2017 1125   NITRITE NEGATIVE 02/21/2017 1050   LEUKOCYTESUR Negative 05/29/2017 1125   LEUKOCYTESUR Negative 05/06/2014 2100     RADIOLOGY: Dg Pelvis 1-2 Views  Result Date: 12/02/2017 CLINICAL DATA:  Patient fell twice.  Concern for pelvis injury. EXAM: PELVIS - 1-2 VIEW COMPARISON:  None. FINDINGS: There is no evidence of pelvic fracture or diastasis. No pelvic bone lesions are seen. IMPRESSION: Negative. Electronically Signed   By: Staci Righter M.D.   On: 12/02/2017 13:31   Ct Head Wo Contrast  Result Date: 12/02/2017 CLINICAL DATA:  Fall twice yesterday.  Struck head, anticoagulation. EXAM: CT HEAD WITHOUT CONTRAST TECHNIQUE: Contiguous axial images were obtained from the base of the skull through the vertex without intravenous contrast. COMPARISON:  09/15/2017 FINDINGS: Brain: Stable mass anteriorly in the left middle cranial fossa favoring meningioma. Mildly accentuated hypodensity in the posterior limb of the right internal capsule measuring 1.0 by 0.7 cm on images 12-13 of series 2, subacute infarct in this vicinity not excluded. Periventricular white matter and corona radiata hypodensities favor chronic ischemic microvascular white matter disease. Otherwise, the brainstem, cerebellum, cerebral peduncles, thalami, basal ganglia, and basilar cisterns appear unremarkable. No intracranial hemorrhage or acute CVA. Vascular: There is atherosclerotic calcification of the cavernous carotid arteries bilaterally. Skull: Slight irregularity of the greater wing of the sphenoid in the vicinity of the anteromedial cranial fossa mass compatible with reactive findings from the mass. Sinuses/Orbits: Suspected old right inferior orbital wall fracture. Otherwise unremarkable. Other: No supplemental non-categorized findings. IMPRESSION: 1. Mildly greater asymmetric focal hypodensity focally in the posterior limb of the right internal capsule compared to previous exam from 09/15/2017, subacute lacunar infarct is not excluded.  This could be further assessed with MRI if clinically warranted. 2. No intracranial hemorrhage is observed. 3. Mass anteriorly in the middle cranial fossa appears unchanged from previous exams and is probably a meningioma. 4. Old healed right inferior orbital wall fracture. 5. Atherosclerosis. 6. Periventricular white matter and corona radiata hypodensities favor chronic ischemic microvascular white matter disease. Electronically Signed    By: Van Clines M.D.   On: 12/02/2017 13:07   Mr Brain Wo Contrast  Result Date: 12/02/2017 CLINICAL DATA:  Possible stroke on CT. Fell twice yesterday. Recent syncopal episode. EXAM: MRI HEAD WITHOUT CONTRAST TECHNIQUE: Multiplanar, multiecho pulse sequences of the brain and surrounding structures were obtained without intravenous contrast. COMPARISON:  CT same day.  MRI 05/26/2017. FINDINGS: Brain: Diffusion imaging confirms that there is an acute infarction in the posterior limb internal capsule on the right as was suggested by the CT. No other acute finding. Elsewhere, there is generalized atrophy and chronic small-vessel ischemic change of the hemispheric white matter. There is an old small left parietal cortical and subcortical stroke. No large vessel territory infarction. No intra-axial mass lesion, hemorrhage, hydrocephalus or extra-axial collection. 2.4 cm meningioma redemonstrated at the anterior middle cranial fossa on the left. Vascular: Major vessels at the base of the brain show flow. Skull and upper cervical spine: Negative Sinuses/Orbits: Mild inflammatory changes of the right maxillary sinus. Orbits negative. Other: None IMPRESSION: MRI confirms acute infarction in the posterior limb internal capsule on the right as was suggested by CT. Chronic small-vessel ischemic changes elsewhere affecting the brain of a chronic nature. 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 Electronically Signed   By: Nelson Chimes M.D.   On: 12/02/2017 14:38   Dg Knee Complete 4 Views Left  Result Date: 12/02/2017 CLINICAL DATA:  Fall.  Pain. EXAM: LEFT KNEE - COMPLETE 4+ VIEW COMPARISON:  None. FINDINGS: Severe degenerative change, worst in the medial compartment. Moderate medial translation of the femur on the tibia. Advanced patellofemoral DJD. No fracture or dislocation. No effusion. IMPRESSION: Severe DJD.  No fracture, dislocation, or effusion.  Electronically Signed   By: Staci Righter M.D.   On: 12/02/2017 13:33    EKG: Orders placed or performed during the hospital encounter of 12/02/17  . EKG 12-Lead  . EKG 12-Lead  . ED EKG within 10 minutes  . ED EKG within 10 minutes    IMPRESSION AND PLAN:  *Acute stroke Monitor for arrhythmia. Check lipid panel, hemoglobin A1c Patient had echocardiogram done 3 days ago so no need to repeat. I will do carotid Doppler study. Neurologist consult.  PT evaluation, swallow evaluation.  *Paroxysmal atrial fibrillation Continue digoxin and metoprolol.  Continue Eliquis.  *Hypertension Continue metoprolol but hold benazepril to avoid blood pressure slightly on the high side for perfusion.  *Diabetes Sliding scale coverage and continue home medications.  *Chronic diastolic heart failure Continue Lasix.  No exacerbation symptoms.  *COPD Continue DuoNeb.  All the records are reviewed and case discussed with ED provider. Management plans discussed with the patient, family and they are in agreement.  CODE STATUS: Full code. Code Status History    Date Active Date Inactive Code Status Order ID Comments User Context   05/22/2017 0844 05/23/2017 1545 Full Code [id number]  [name], MD Inpatient   02/21/2017 0413 02/22/2017 2111 Full Code [id number]  Harrie Foreman, MD ED   02/08/2017 0615 02/11/2017 1655 Full Code [id number]  Harrie Foreman, MD Inpatient  08/06/2014 1821 08/12/2014 2019 Full Code [id number]  Aldean Jewett, MD Inpatient     Spoke to patient's wife and 2 sons in the room.  TOTAL TIME TAKING CARE OF THIS PATIENT: 45 minutes.    Vaughan Basta M.D on 12/02/2017   Between 7am to 6pm - Pager - [phone]  After 6pm go to [email or web address] - password [email or web address] [facility]  Office  [phone]  CC: Primary care physician; Perrin Maltese, MD   Note: This dictation was prepared with Dragon dictation along with smaller phrase  technology. Any transcriptional errors that result from this process are unintentional.

## 2017-12-03 DIAGNOSIS — I639 Cerebral infarction, unspecified: Secondary | ICD-10-CM

## 2017-12-03 LAB — CBC
HCT: 45.1 % (ref 39.0–52.0)
HEMOGLOBIN: 15.1 g/dL (ref 13.0–17.0)
MCH: 29.5 pg (ref 26.0–34.0)
MCHC: 33.5 g/dL (ref 30.0–36.0)
MCV: 88.3 fL (ref 80.0–100.0)
PLATELETS: 185 10*3/uL (ref 150–400)
RBC: 5.11 MIL/uL (ref 4.22–5.81)
RDW: 13.3 % (ref 11.5–15.5)
WBC: 7.6 10*3/uL (ref 4.0–10.5)
nRBC: 0 % (ref 0.0–0.2)

## 2017-12-03 LAB — BASIC METABOLIC PANEL
Anion gap: 9 (ref 5–15)
BUN: 12 mg/dL (ref 8–23)
CALCIUM: 8.9 mg/dL (ref 8.9–10.3)
CHLORIDE: 97 mmol/L — AB (ref 98–111)
CO2: 29 mmol/L (ref 22–32)
CREATININE: 0.91 mg/dL (ref 0.61–1.24)
GFR calc Af Amer: >60 mL/min (ref >60)
GFR calc non Af Amer: >60 mL/min (ref >60)
GLUCOSE: 215 mg/dL — AB (ref 70–99)
Potassium: 3.6 mmol/L (ref 3.5–5.1)
Sodium: 135 mmol/L (ref 135–145)

## 2017-12-03 LAB — LIPID PANEL
Cholesterol: 159 mg/dL (ref 0–200)
HDL: 32 mg/dL — AB (ref >40)
LDL CALC: 110 mg/dL — AB (ref 0–99)
Total CHOL/HDL Ratio: 5 RATIO
Triglycerides: 84 mg/dL (ref <150)
VLDL: 17 mg/dL (ref 0–40)

## 2017-12-03 LAB — GLUCOSE, CAPILLARY
GLUCOSE-CAPILLARY: 134 mg/dL — AB (ref 70–99)
GLUCOSE-CAPILLARY: 198 mg/dL — AB (ref 70–99)
GLUCOSE-CAPILLARY: 203 mg/dL — AB (ref 70–99)
GLUCOSE-CAPILLARY: 218 mg/dL — AB (ref 70–99)

## 2017-12-03 LAB — HEMOGLOBIN A1C
HEMOGLOBIN A1C: 8.5 % — AB (ref 4.8–5.6)
Mean Plasma Glucose: 197.25 mg/dL

## 2017-12-03 MED ORDER — ATORVASTATIN CALCIUM 20 MG PO TABS
40.0000 mg | ORAL_TABLET | Freq: Every day | ORAL | Status: DC
  Administered 2017-12-03 – 2017-12-07 (×5): 40 mg via ORAL
  Filled 2017-12-03 (×5): qty 2

## 2017-12-03 MED ORDER — ATORVASTATIN CALCIUM 40 MG PO TABS: 40.0000 mg | ORAL_TABLET | Freq: Every day | ORAL | 1 refills | Status: AC

## 2017-12-03 MED ORDER — DIPHENHYDRAMINE HCL 25 MG PO CAPS
25.0000 mg | ORAL_CAPSULE | Freq: Four times a day (QID) | ORAL | Status: DC | PRN
  Administered 2017-12-03 – 2017-12-05 (×3): 25 mg via ORAL
  Filled 2017-12-03 (×5): qty 1

## 2017-12-03 NOTE — Plan of Care (Signed)

## 2017-12-03 NOTE — Consult Note (Signed)
Reason for Consult:stroke  Referring Physician: Dr. Bridgett Larsson  CC: L sided weakness   HPI: Derek Shelton is an 77 y.o. male with a known history of asthma, congestive heart failure, COPD, coronary artery disease, dementia, diabetes without complication, atrial fibrillation, hyperlipidemia, hypertension, hypothyroidism, sleep apnea-had episode. Pt comes in with 2 day hx of L sided weakness. He is compliant with eliquis.    Past Medical History:  Diagnosis Date  . Anemia   . Asthma   . CHF (congestive heart failure) ([place])   . COPD (chronic obstructive pulmonary disease) ([place])   . Coronary artery disease   . Dementia ([place])    Per son's report  . Diabetes mellitus without complication ([place])   . Dysrhythmia    Atrial Fibrillation  . GERD (gastroesophageal reflux disease)   . Hyperlipidemia   . Hypertension   . Hypothyroidism   . Shortness of breath dyspnea   . Sleep apnea   . Thyroid disease     Past Surgical History:  Procedure Laterality Date  . CARDIAC CATHETERIZATION    . COLONOSCOPY N/A 08/10/2014   Procedure: COLONOSCOPY;  Surgeon: Manya Silvas, MD;  Location: [facility] ENDOSCOPY;  Service: Endoscopy;  Laterality: N/A;  . COLONOSCOPY WITH PROPOFOL N/A 04/05/2016   Procedure: COLONOSCOPY WITH PROPOFOL;  Surgeon: Lollie Sails, MD;  Location: [facility] ENDOSCOPY;  Service: Endoscopy;  Laterality: N/A;  . ESOPHAGOGASTRODUODENOSCOPY N/A 08/08/2014   Procedure: ESOPHAGOGASTRODUODENOSCOPY (EGD);  Surgeon: Manya Silvas, MD;  Location: [facility] ENDOSCOPY;  Service: Endoscopy;  Laterality: N/A;  plan for early afternoon  . EYE SURGERY    . GIVENS CAPSULE STUDY N/A 08/12/2014   Procedure: GIVENS CAPSULE STUDY;  Surgeon: Manya Silvas, MD;  Location: [facility] ENDOSCOPY;  Service: Endoscopy;  Laterality: N/A;  . rectal fistula N/A     Family History  Problem Relation Age of Onset  . Diabetes Mother     Social History:  reports that he has quit smoking. His smokeless tobacco use includes  chew. He reports that he does not drink alcohol or use drugs.  Allergies  Allergen Reactions  . Catapres [Clonidine Hcl]   . Coreg [Carvedilol]   . Penicillins     Medications: I have reviewed the patient's current medications.  ROS: Unable to obtain due to language barrier  Physical Examination: Blood pressure 130/68, pulse 75, temperature 97.8 F (36.6 C), temperature source Oral, resp. rate 18, height 5\' 4"  (1.626 m), weight 87.2 kg, SpO2 99 %.    Neurological Examination   Mental Status: Alert, oriented,with assistance of interpreter  Cranial Nerves: II: Discs flat bilaterally; Visual fields grossly normal, pupils equal, round, reactive to light and accommodation III,IV, VI: ptosis not present, extra-ocular motions intact bilaterally V,VII: smile symmetric, facial light touch sensation normal bilaterally VIII: hearing normal bilaterally IX,X: gag reflex present XI: bilateral shoulder shrug XII: midline tongue extension Motor: L sided drift.  Tone and bulk:normal tone throughout; no atrophy noted Sensory: Pinprick and light touch intact throughout, bilaterally Deep Tendon Reflexes: 1+ and symmetric throughout Plantars: Right: downgoing   Left: downgoing Cerebellar: normal finger-to-nose, normal rapid alternating movements and normal heel-to-shin test Gait: not tested       Laboratory Studies:   Basic Metabolic Panel: Recent Labs  Lab 12/02/17 1214 12/03/17 0940  NA 136 135  K 3.6 3.6  CL 96* 97*  CO2 30 29  GLUCOSE 242* 215*  BUN 11 12  CREATININE 0.94 0.91  CALCIUM 9.3 8.9    Liver Function Tests:  No results for input(s): AST, ALT, ALKPHOS, BILITOT, PROT, ALBUMIN in the last 168 hours. No results for input(s): LIPASE, AMYLASE in the last 168 hours. No results for input(s): AMMONIA in the last 168 hours.  CBC: Recent Labs  Lab 12/02/17 1214 12/03/17 0940  WBC 7.7 7.6  HGB 16.1 15.1  HCT 48.6 45.1  MCV 88.7 88.3  PLT 200 185    Cardiac  Enzymes: Recent Labs  Lab 12/02/17 1214  TROPONINI <0.03    BNP: Invalid input(s): POCBNP  CBG: Recent Labs  Lab 12/02/17 2124 12/03/17 0828 12/03/17 1157  GLUCAP 187* 134* 78*    Microbiology: Results for orders placed or performed during the hospital encounter of 02/21/17  C difficile quick scan w PCR reflex     Status: None   Collection Time: 02/21/17  8:02 AM  Result Value Ref Range Status   C Diff antigen NEGATIVE NEGATIVE Final   C Diff toxin NEGATIVE NEGATIVE Final   C Diff interpretation No C. difficile detected.  Final    Comment: Performed at [facility], [address] [place], [place] [address]  Gastrointestinal Panel by PCR , Stool     Status: None   Collection Time: 02/21/17  8:02 AM  Result Value Ref Range Status   Campylobacter species NOT DETECTED NOT DETECTED Final   Plesimonas shigelloides NOT DETECTED NOT DETECTED Final   Salmonella species NOT DETECTED NOT DETECTED Final   Yersinia enterocolitica NOT DETECTED NOT DETECTED Final   Vibrio species NOT DETECTED NOT DETECTED Final   Vibrio cholerae NOT DETECTED NOT DETECTED Final   Enteroaggregative E coli (EAEC) NOT DETECTED NOT DETECTED Final   Enteropathogenic E coli (EPEC) NOT DETECTED NOT DETECTED Final   Enterotoxigenic E coli (ETEC) NOT DETECTED NOT DETECTED Final   Shiga like toxin producing E coli (STEC) NOT DETECTED NOT DETECTED Final   Shigella/Enteroinvasive E coli (EIEC) NOT DETECTED NOT DETECTED Final   Cryptosporidium NOT DETECTED NOT DETECTED Final   Cyclospora cayetanensis NOT DETECTED NOT DETECTED Final   Entamoeba histolytica NOT DETECTED NOT DETECTED Final   Giardia lamblia NOT DETECTED NOT DETECTED Final   Adenovirus F40/41 NOT DETECTED NOT DETECTED Final   Astrovirus NOT DETECTED NOT DETECTED Final   Norovirus GI/GII NOT DETECTED NOT DETECTED Final   Rotavirus A NOT DETECTED NOT DETECTED Final   Sapovirus (I, II, IV, and V) NOT DETECTED NOT DETECTED Final     Comment: Performed at [facility], [address] [place], [place] [address]    Coagulation Studies: No results for input(s): LABPROT, INR in the last 72 hours.  Urinalysis: No results for input(s): COLORURINE, LABSPEC, PHURINE, GLUCOSEU, HGBUR, BILIRUBINUR, KETONESUR, PROTEINUR, UROBILINOGEN, NITRITE, LEUKOCYTESUR in the last 168 hours.  Invalid input(s): APPERANCEUR  Lipid Panel:     Component Value Date/Time   CHOL 159 12/03/2017 0940   TRIG 84 12/03/2017 0940   HDL 32 (L) 12/03/2017 0940   CHOLHDL 5.0 12/03/2017 0940   VLDL 17 12/03/2017 0940   LDLCALC 110 (H) 12/03/2017 0940    HgbA1C:  Lab Results  Component Value Date   HGBA1C 8.5 (H) 12/02/2017    Urine Drug Screen:  No results found for: LABOPIA, COCAINSCRNUR, LABBENZ, AMPHETMU, THCU, LABBARB  Alcohol Level: No results for input(s): ETH in the last 168 hours.  Other results: A-fib rate controlled  Imaging: Dg Chest 2 View  Result Date: 12/02/2017 CLINICAL DATA:  Acute ischemic stroke. EXAM: CHEST - 2 VIEW COMPARISON:  Radiographs of May 22, 2017. FINDINGS: Stable cardiomegaly. No pneumothorax or pleural effusion is noted. No acute pulmonary disease is noted. Bony thorax is unremarkable. IMPRESSION: No active cardiopulmonary disease. Electronically Signed   By: Marijo Conception, M.D.   On: 12/02/2017 19:38   Dg Pelvis 1-2 Views  Result Date: 12/02/2017 CLINICAL DATA:  Patient fell twice.  Concern for pelvis injury. EXAM: PELVIS - 1-2 VIEW COMPARISON:  None. FINDINGS: There is no evidence of pelvic fracture or diastasis. No pelvic bone lesions are seen. IMPRESSION: Negative. Electronically Signed   By: Staci Righter M.D.   On: 12/02/2017 13:31   Ct Head Wo Contrast  Result Date: 12/02/2017 CLINICAL DATA:  Fall twice yesterday.  Struck head, anticoagulation. EXAM: CT HEAD WITHOUT CONTRAST TECHNIQUE: Contiguous axial images were obtained from the base of the skull through the vertex without intravenous  contrast. COMPARISON:  09/15/2017 FINDINGS: Brain: Stable mass anteriorly in the left middle cranial fossa favoring meningioma. Mildly accentuated hypodensity in the posterior limb of the right internal capsule measuring 1.0 by 0.7 cm on images 12-13 of series 2, subacute infarct in this vicinity not excluded. Periventricular white matter and corona radiata hypodensities favor chronic ischemic microvascular white matter disease. Otherwise, the brainstem, cerebellum, cerebral peduncles, thalami, basal ganglia, and basilar cisterns appear unremarkable. No intracranial hemorrhage or acute CVA. Vascular: There is atherosclerotic calcification of the cavernous carotid arteries bilaterally. Skull: Slight irregularity of the greater wing of the sphenoid in the vicinity of the anteromedial cranial fossa mass compatible with reactive findings from the mass. Sinuses/Orbits: Suspected old right inferior orbital wall fracture. Otherwise unremarkable. Other: No supplemental non-categorized findings. IMPRESSION: 1. Mildly greater asymmetric focal hypodensity focally in the posterior limb of the right internal capsule compared to previous exam from 09/15/2017, subacute lacunar infarct is not excluded. This could be further assessed with MRI if clinically warranted. 2. No intracranial hemorrhage is observed. 3. Mass anteriorly in the middle cranial fossa appears unchanged from previous exams and is probably a meningioma. 4. Old healed right inferior orbital wall fracture. 5. Atherosclerosis. 6. Periventricular white matter and corona radiata hypodensities favor chronic ischemic microvascular white matter disease. Electronically Signed   By: Van Clines M.D.   On: 12/02/2017 13:07   Mr Brain Wo Contrast  Result Date: 12/02/2017 CLINICAL DATA:  Possible stroke on CT. Fell twice yesterday. Recent syncopal episode. EXAM: MRI HEAD WITHOUT CONTRAST TECHNIQUE: Multiplanar, multiecho pulse sequences of the brain and surrounding  structures were obtained without intravenous contrast. COMPARISON:  CT same day.  MRI 05/26/2017. FINDINGS: Brain: Diffusion imaging confirms that there is an acute infarction in the posterior limb internal capsule on the right as was suggested by the CT. No other acute finding. Elsewhere, there is generalized atrophy and chronic small-vessel ischemic change of the hemispheric white matter. There is an old small left parietal cortical and subcortical stroke. No large vessel territory infarction. No intra-axial mass lesion, hemorrhage, hydrocephalus or extra-axial collection. 2.4 cm meningioma redemonstrated at the anterior middle cranial fossa on the left. Vascular: Major vessels at the base of the brain show flow. Skull and upper cervical spine: Negative Sinuses/Orbits: Mild inflammatory changes of the right maxillary sinus. Orbits negative. Other: None IMPRESSION: MRI confirms acute infarction in the posterior limb internal capsule on the right as was suggested by CT. Chronic small-vessel ischemic changes elsewhere affecting the brain of a chronic nature. 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 Electronically Signed   By: Elta Guadeloupe  Shogry M.D.   On: 12/02/2017 14:38   US Carotid Bilateral (at [facility] Only)  Result Date: 12/03/2017 CLINICAL DATA:  77 year old male with acute right posterior limb internal capsule infarct EXAM: BILATERAL CAROTID DUPLEX ULTRASOUND TECHNIQUE: Pearline Cables scale imaging, color Doppler and duplex ultrasound were performed of bilateral carotid and vertebral arteries in the neck. COMPARISON:  None. FINDINGS: Criteria: Quantification of carotid stenosis is based on velocity parameters that correlate the residual internal carotid diameter with NASCET-based stenosis levels, using the diameter of the distal internal carotid lumen as the denominator for stenosis measurement. The following velocity measurements were obtained: RIGHT ICA: 44/9  cm/sec CCA: 10/2 cm/sec SYSTOLIC ICA/CCA RATIO:  0.7 ECA:  68 cm/sec LEFT ICA: 53/15 cm/sec CCA: 58/52 cm/sec SYSTOLIC ICA/CCA RATIO:  1.1 ECA:  57 cm/sec RIGHT CAROTID ARTERY: No significant atherosclerotic plaque or evidence of stenosis. Of note, the carotid bifurcation is high. RIGHT VERTEBRAL ARTERY:  Patent with normal antegrade flow. LEFT CAROTID ARTERY: No significant atherosclerotic plaque or evidence of stenosis. The carotid bifurcation is somewhat high. LEFT VERTEBRAL ARTERY:  Patent with normal antegrade flow. IMPRESSION: 1. No evidence of significant stenosis or atherosclerotic plaque in either internal carotid artery. 2. Vertebral arteries are patent with normal antegrade flow. Signed, Criselda Peaches, MD, [facility] Electronically Signed   By: Jacqulynn Cadet M.D.   On: 12/03/2017 08:16   Dg Knee Complete 4 Views Left  Result Date: 12/02/2017 CLINICAL DATA:  Fall.  Pain. EXAM: LEFT KNEE - COMPLETE 4+ VIEW COMPARISON:  None. FINDINGS: Severe degenerative change, worst in the medial compartment. Moderate medial translation of the femur on the tibia. Advanced patellofemoral DJD. No fracture or dislocation. No effusion. IMPRESSION: Severe DJD.  No fracture, dislocation, or effusion. Electronically Signed   By: Staci Righter M.D.   On: 12/02/2017 13:33     Assessment/Plan:  77 y.o. male with a known history of asthma, congestive heart failure, COPD, coronary artery disease, dementia, diabetes without complication, atrial fibrillation, hyperlipidemia, hypertension, hypothyroidism, sleep apnea-had episode. Pt comes in with 2 day hx of L sided weakness. He is compliant with eliquis.    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

## 2017-12-03 NOTE — Progress Notes (Signed)
   12/03/17 0900  Clinical Encounter Type  Visited With Patient;Family (Wife, Sons (Mohsin and Ovais))  Visit Type Follow-up;Other (Comment) (AD Education)  Referral From Chaplain Therapist, art)  Recommendations Follow-up, if requested.   Due to dementia, an AD cannot be completed for the patient. Chaplain explained to the patient's wife, and sons, Mohsin and Ovais, the succession of decision-making for family members who assume the position of HCPOA under [place] law. According to [place], the patient has two other brothers (a Marine scientist and doctor). Both of these sons are aware of the patient's condition. The patient has been living with the son who is a Marine scientist, and it is expected that he will make decisions for his father. This son will arrive later today. Chaplain informed the charge nurse of the situation. The original OR for an AD has been closed.

## 2017-12-03 NOTE — Consult Note (Signed)
Derek Shelton is a 77 y.o. male  [id number]  Primary Cardiologist: Dr. Neoma Laming Reason for Consultation: Afib with pauses  HPI: 77 yo male with a past medical history of atrial fibrillation,  dementia, lives at home with his wife, history of chronic arthritis and knee pain, presents to the ER with 2 falls yesterday and complains of left knee pain. MRI brain confirms acute stroke and pt was admitted. While on telemetry he was noted to be having pauses with bradycardia.   Review of Systems: Feeling well, denies chest pain or shortness of breath.   Past Medical History:  Diagnosis Date  . Anemia   . Asthma   . CHF (congestive heart failure) ([place])   . COPD (chronic obstructive pulmonary disease) ([place])   . Coronary artery disease   . Dementia ([place])    Per son's report  . Diabetes mellitus without complication ([place])   . Dysrhythmia    Atrial Fibrillation  . GERD (gastroesophageal reflux disease)   . Hyperlipidemia   . Hypertension   . Hypothyroidism   . Shortness of breath dyspnea   . Sleep apnea   . Thyroid disease     Medications Prior to Admission  Medication Sig Dispense Refill  . apixaban (ELIQUIS) 5 MG TABS tablet Take 1 tablet (5 mg total) by mouth 2 (two) times daily. 60 tablet 1  . benzonatate (TESSALON) 100 MG capsule Take 100 mg by mouth 3 (three) times daily as needed for cough.    . budesonide-formoterol (SYMBICORT) 160-4.5 MCG/ACT inhaler Inhale 2 puffs into the lungs 2 (two) times daily.    . digoxin (LANOXIN) 0.25 MG tablet Take 1 tablet (0.25 mg total) by mouth daily. (Patient taking differently: Take 0.125 mg by mouth daily. ) 30 tablet 0  . donepezil (ARICEPT) 5 MG tablet Take 5 mg by mouth at bedtime.    . DULoxetine (CYMBALTA) 60 MG capsule Take 60 mg by mouth daily.    Marland Kitchen esomeprazole (NEXIUM) 40 MG capsule Take 40 mg by mouth daily at 12 noon.    . ferrous sulfate 325 (65 FE) MG EC tablet Take 325 mg by mouth daily.    . finasteride (PROSCAR) 5 MG  tablet Take 1 tablet (5 mg total) by mouth daily. 90 tablet 3  . furosemide (LASIX) 20 MG tablet Take 20 mg by mouth 2 (two) times daily.    Marland Kitchen glimepiride (AMARYL) 2 MG tablet Take 3 mg by mouth 2 (two) times daily.     Marland Kitchen guaiFENesin-dextromethorphan (ROBITUSSIN DM) 100-10 MG/5ML syrup Take 5 mLs by mouth every 4 (four) hours as needed for cough. 118 mL 0  . insulin degludec (TRESIBA FLEXTOUCH) 100 UNIT/ML SOPN FlexTouch Pen Inject 15 Units into the skin at bedtime.    Marland Kitchen ipratropium-albuterol (DUONEB) 0.5-2.5 (3) MG/3ML SOLN Inhale 3 mLs into the lungs every 6 (six) hours as needed for shortness of breath.  3  . levothyroxine (SYNTHROID, LEVOTHROID) 112 MCG tablet Take 112 mcg by mouth daily before breakfast.     . Melatonin 3 MG TABS Take 3 mg by mouth at bedtime as needed (sleep).     . metoprolol tartrate (LOPRESSOR) 100 MG tablet Take 1 tablet (100 mg total) by mouth 2 (two) times daily. 60 tablet 0  . montelukast (SINGULAIR) 10 MG tablet Take 10 mg by mouth daily.    Marland Kitchen omega-3 acid ethyl esters (LOVAZA) 1 G capsule Take 2 g by mouth 2 (two) times daily.     Marland Kitchen  potassium chloride SA (K-DUR,KLOR-CON) 20 MEQ tablet Take 40 mEq by mouth See admin instructions. Take 2 tablets (40MEQ) by mouth twice daily for 3 days as directed    . glimepiride (AMARYL) 1 MG tablet Take 1 tablet (1 mg total) by mouth daily with breakfast. (Patient not taking: Reported on 12/02/2017) 30 tablet 0     . apixaban  5 mg Oral BID  . donepezil  5 mg Oral QHS  . DULoxetine  60 mg Oral Daily  . ferrous sulfate  325 mg Oral Daily  . finasteride  5 mg Oral Daily  . furosemide  20 mg Oral BID  . glimepiride  1 mg Oral Q breakfast  . insulin aspart  0-9 Units Subcutaneous TID WC  . levothyroxine  112 mcg Oral QAC breakfast  . mometasone-formoterol  2 puff Inhalation BID  . omega-3 acid ethyl esters  2 g Oral BID  . pantoprazole  40 mg Oral Daily    Infusions: . sodium chloride 50 mL/hr at 12/03/17 0045    Allergies   Allergen Reactions  . Catapres [Clonidine Hcl]   . Coreg [Carvedilol]   . Penicillins     Social History   Socioeconomic History  . Marital status: Married    Spouse name: Not on file  . Number of children: Not on file  . Years of education: Not on file  . Highest education level: Not on file  Occupational History  . Not on file  Social Needs  . Financial resource strain: Not on file  . Food insecurity:    Worry: Not on file    Inability: Not on file  . Transportation needs:    Medical: Not on file    Non-medical: Not on file  Tobacco Use  . Smoking status: Former Research scientist (life sciences)  . Smokeless tobacco: Current User    Types: Chew  Substance and Sexual Activity  . Alcohol use: No  . Drug use: No  . Sexual activity: Not on file  Lifestyle  . Physical activity:    Days per week: Not on file    Minutes per session: Not on file  . Stress: Not on file  Relationships  . Social connections:    Talks on phone: Patient refused    Gets together: Patient refused    Attends religious service: Patient refused    Active member of club or organization: Patient refused    Attends meetings of clubs or organizations: Patient refused    Relationship status: Patient refused  . Intimate partner violence:    Fear of current or ex partner: Patient refused    Emotionally abused: Patient refused    Physically abused: Patient refused    Forced sexual activity: Patient refused  Other Topics Concern  . Not on file  Social History Narrative  . Not on file    Family History  Problem Relation Age of Onset  . Diabetes Mother     PHYSICAL EXAM: Vitals:   12/02/17 2118 12/03/17 0538  BP: (!) 149/75 (!) 151/72  Pulse: 66 60  Resp: (!) 21 19  Temp: 98.3 F (36.8 C) 98.3 F (36.8 C)  SpO2: 98% 99%     Intake/Output Summary (Last 24 hours) at 12/03/2017 0744 Last data filed at 12/03/2017 0049 Gross per 24 hour  Intake 61.59 ml  Output -  Net 61.59 ml    General:  Well appearing. No  respiratory difficulty HEENT: normal Neck: supple. no JVD. Carotids 2+ bilat; no bruits. No lymphadenopathy  or thryomegaly appreciated. Cor: Irregular rhythm Lungs: clear Abdomen: soft, nontender, nondistended. No hepatosplenomegaly. No bruits or masses. Good bowel sounds. Extremities: no cyanosis, clubbing, rash, edema Neuro: No facial droop, moves all four limbs Psych: Affect pleasant. Responds to questions appropriately.  ECG: Atrial fibrillation, LBBB, ventricular rate 86bpm  Results for orders placed or performed during the hospital encounter of 12/02/17 (from the past 24 hour(s))  Basic metabolic panel     Status: Abnormal   Collection Time: 12/02/17 12:14 PM  Result Value Ref Range   Sodium 136 135 - 145 mmol/L   Potassium 3.6 3.5 - 5.1 mmol/L   Chloride 96 (L) 98 - 111 mmol/L   CO2 30 22 - 32 mmol/L   Glucose, Bld 242 (H) 70 - 99 mg/dL   BUN 11 8 - 23 mg/dL   Creatinine, Ser 0.94 0.61 - 1.24 mg/dL   Calcium 9.3 8.9 - 10.3 mg/dL   GFR calc non Af Amer >60 >60 mL/min   GFR calc Af Amer >60 >60 mL/min   Anion gap 10 5 - 15  CBC     Status: None   Collection Time: 12/02/17 12:14 PM  Result Value Ref Range   WBC 7.7 4.0 - 10.5 K/uL   RBC 5.48 4.22 - 5.81 MIL/uL   Hemoglobin 16.1 13.0 - 17.0 g/dL   HCT 48.6 39.0 - 52.0 %   MCV 88.7 80.0 - 100.0 fL   MCH 29.4 26.0 - 34.0 pg   MCHC 33.1 30.0 - 36.0 g/dL   RDW 13.2 11.5 - 15.5 %   Platelets 200 150 - 400 K/uL   nRBC 0.0 0.0 - 0.2 %  Troponin I STAT - ONCE     Status: None   Collection Time: 12/02/17 12:14 PM  Result Value Ref Range   Troponin I <0.03 <0.03 ng/mL  Hemoglobin A1c     Status: Abnormal   Collection Time: 12/02/17 12:14 PM  Result Value Ref Range   Hgb A1c MFr Bld 8.5 (H) 4.8 - 5.6 %   Mean Plasma Glucose 197.25 mg/dL  Glucose, capillary     Status: Abnormal   Collection Time: 12/02/17  9:24 PM  Result Value Ref Range   Glucose-Capillary 187 (H) 70 - 99 mg/dL   Dg Chest 2 View  Result Date:  12/02/2017 CLINICAL DATA:  Acute ischemic stroke. EXAM: CHEST - 2 VIEW COMPARISON:  Radiographs of May 22, 2017. FINDINGS: Stable cardiomegaly. No pneumothorax or pleural effusion is noted. No acute pulmonary disease is noted. Bony thorax is unremarkable. IMPRESSION: No active cardiopulmonary disease. Electronically Signed   By: Marijo Conception, M.D.   On: 12/02/2017 19:38   Dg Pelvis 1-2 Views  Result Date: 12/02/2017 CLINICAL DATA:  Patient fell twice.  Concern for pelvis injury. EXAM: PELVIS - 1-2 VIEW COMPARISON:  None. FINDINGS: There is no evidence of pelvic fracture or diastasis. No pelvic bone lesions are seen. IMPRESSION: Negative. Electronically Signed   By: Staci Righter M.D.   On: 12/02/2017 13:31   Ct Head Wo Contrast  Result Date: 12/02/2017 CLINICAL DATA:  Fall twice yesterday.  Struck head, anticoagulation. EXAM: CT HEAD WITHOUT CONTRAST TECHNIQUE: Contiguous axial images were obtained from the base of the skull through the vertex without intravenous contrast. COMPARISON:  09/15/2017 FINDINGS: Brain: Stable mass anteriorly in the left middle cranial fossa favoring meningioma. Mildly accentuated hypodensity in the posterior limb of the right internal capsule measuring 1.0 by 0.7 cm on images 12-13 of series 2, subacute  infarct in this vicinity not excluded. Periventricular white matter and corona radiata hypodensities favor chronic ischemic microvascular white matter disease. Otherwise, the brainstem, cerebellum, cerebral peduncles, thalami, basal ganglia, and basilar cisterns appear unremarkable. No intracranial hemorrhage or acute CVA. Vascular: There is atherosclerotic calcification of the cavernous carotid arteries bilaterally. Skull: Slight irregularity of the greater wing of the sphenoid in the vicinity of the anteromedial cranial fossa mass compatible with reactive findings from the mass. Sinuses/Orbits: Suspected old right inferior orbital wall fracture. Otherwise unremarkable.  Other: No supplemental non-categorized findings. IMPRESSION: 1. Mildly greater asymmetric focal hypodensity focally in the posterior limb of the right internal capsule compared to previous exam from 09/15/2017, subacute lacunar infarct is not excluded. This could be further assessed with MRI if clinically warranted. 2. No intracranial hemorrhage is observed. 3. Mass anteriorly in the middle cranial fossa appears unchanged from previous exams and is probably a meningioma. 4. Old healed right inferior orbital wall fracture. 5. Atherosclerosis. 6. Periventricular white matter and corona radiata hypodensities favor chronic ischemic microvascular white matter disease. Electronically Signed   By: Van Clines M.D.   On: 12/02/2017 13:07   Mr Brain Wo Contrast  Result Date: 12/02/2017 CLINICAL DATA:  Possible stroke on CT. Fell twice yesterday. Recent syncopal episode. EXAM: MRI HEAD WITHOUT CONTRAST TECHNIQUE: Multiplanar, multiecho pulse sequences of the brain and surrounding structures were obtained without intravenous contrast. COMPARISON:  CT same day.  MRI 05/26/2017. FINDINGS: Brain: Diffusion imaging confirms that there is an acute infarction in the posterior limb internal capsule on the right as was suggested by the CT. No other acute finding. Elsewhere, there is generalized atrophy and chronic small-vessel ischemic change of the hemispheric white matter. There is an old small left parietal cortical and subcortical stroke. No large vessel territory infarction. No intra-axial mass lesion, hemorrhage, hydrocephalus or extra-axial collection. 2.4 cm meningioma redemonstrated at the anterior middle cranial fossa on the left. Vascular: Major vessels at the base of the brain show flow. Skull and upper cervical spine: Negative Sinuses/Orbits: Mild inflammatory changes of the right maxillary sinus. Orbits negative. Other: None IMPRESSION: MRI confirms acute infarction in the posterior limb internal capsule on  the right as was suggested by CT. Chronic small-vessel ischemic changes elsewhere affecting the brain of a chronic nature. 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 Electronically Signed   By: Nelson Chimes M.D.   On: 12/02/2017 14:38   Dg Knee Complete 4 Views Left  Result Date: 12/02/2017 CLINICAL DATA:  Fall.  Pain. EXAM: LEFT KNEE - COMPLETE 4+ VIEW COMPARISON:  None. FINDINGS: Severe degenerative change, worst in the medial compartment. Moderate medial translation of the femur on the tibia. Advanced patellofemoral DJD. No fracture or dislocation. No effusion. IMPRESSION: Severe DJD.  No fracture, dislocation, or effusion. Electronically Signed   By: Staci Righter M.D.   On: 12/02/2017 13:33     ASSESSMENT AND PLAN: Status post fall x 2 yesterday possibly due to pauses, new acute cerebral infarction confirmed on MRI, or severe degenerative joint disease of knees.   Atrial fibrillation with pauses: Pt has history of frequent episodes of afib RVR and was on metoprolol and digoxin for rate control. Pauses are new finding. Will hold metoprolol and digoxin. May require pacemaker but will continue to monitor on telemetry during washout of rate controlling medications.    Jake Bathe, NP-C Cell: [phone]

## 2017-12-03 NOTE — Evaluation (Signed)
Physical Therapy Evaluation Patient Details Name: Derek Shelton MRN: [id number] DOB: Jun 07, 1940 Today's Date: 12/03/2017   History of Present Illness  77 y.o. male with a known history of asthma, congestive heart failure, COPD, coronary artery disease, dementia, diabetes without complication, atrial fibrillation, hyperlipidemia, hypertension, hypothyroidism, sleep apnea-had episode of weakness on left side and confusion 3 days ago while out for shopping.  He was taken to [facility]-where after MRI and echocardiogram found negative for acute stroke, suspected to have this episode due to hypoglycemia and sent home.  A few days later (11/7) he again had L side weakness, confusion and 2 falls.  CT and MRI confirm new CVA.  Clinical Impression  Pt showed good effort t/o PT exam, very willing to participate however he had significant pain with WBing in knees (L>R) during standing attempts.  Margorie John unsure when his more recent cortisone shot was, but states that he gets them ~every 4 months.  Pt reports pain as throbbing and that it is markedly different from his typical OA pain.  Pt with some L sided weakness in UE, but surprisingly did not seem to have a lot of functional or coordination limitations per R.  Pt however is very functionally limited with mobility and ambulation and will need rehab to work back to premorbid functional status.    Follow Up Recommendations SNF  - of note, will need Urdu interpreter capacity   Equipment Recommendations  (TBD at rehab, will likely need RW)    Recommendations for Other Services       Precautions / Restrictions Precautions Precautions: Fall Restrictions Weight Bearing Restrictions: No      Mobility  Bed Mobility Overal bed mobility: Needs Assistance Bed Mobility: Supine to Sit     Supine to sit: Min assist;Mod assist     General bed mobility comments: Pt showed good effort in getting to EOB, unable to fully get LEs off EOB or raise trunk  w/o assist to get to EOB  Transfers Overall transfer level: Needs assistance Equipment used: Rolling walker (2 wheeled) Transfers: Sit to/from Stand Sit to Stand: Min assist;Mod assist         General transfer comment: Pt showed good willingness to try to get to standing, both attempts he needed assist to get hips forward and hands to walker appropriately  Ambulation/Gait Ambulation/Gait assistance: Min assist;Mod assist Gait Distance (Feet): 3 Feet Assistive device: Rolling walker (2 wheeled)       General Gait Details: First attempt at standing pt appeared ready but once he put weight on L LE he c/o throbbing pain (that is worse than his baseline) and quickly sat back down.  With cuing and encouragment to try to get to recliner he agreed but was quite limited with shuffling, effortful steps to transfer   Stairs            Wheelchair Mobility    Modified Rankin (Stroke Patients Only)       Balance Overall balance assessment: Needs assistance Sitting-balance support: Bilateral upper extremity supported;Feet supported Sitting balance-Leahy Scale: Good Sitting balance - Comments: Pt able to maintain sitting balance relatively well at EOB, no leaning or LOBs   Standing balance support: Bilateral upper extremity supported Standing balance-Leahy Scale: Poor Standing balance comment: Pt highly reliant on the walker and still very hesitant to do a lot in standing.  Assist to stay upright while standing.  Pertinent Vitals/Pain Pain Assessment: (no pain while laying, severe knee pain (L>R) in [place])    Home Living Family/patient expects to be discharged to:: Skilled nursing facility Living Arrangements: Spouse/significant other;Children Available Help at Discharge: Family;Available 24 hours/day Type of Home: House Home Access: Stairs to enter Entrance Stairs-Rails: Right Entrance Stairs-Number of Steps: 3 + 3 Home Layout: One  level Home Equipment: Cane - single point      Prior Function Level of Independence: Independent with assistive device(s)         Comments: Mod Ind with amb limited community distances with SPC, no fall history apart from these recent 2.  Has had issues with b/l knee OA     Hand Dominance        Extremity/Trunk Assessment   Upper Extremity Assessment Upper Extremity Assessment: (R UE grossly 4-/5, L grossly 3 to 3+/5 but functional)    Lower Extremity Assessment Lower Extremity Assessment: Generalized weakness(more pain limited 2/2 chronic knee issues.  grossly 4-/5)       Communication   Communication: Prefers language other than English(knows enough English to request using son as interp)  Cognition Arousal/Alertness: Lethargic Behavior During Therapy: WFL for tasks assessed/performed Overall Cognitive Status: History of cognitive impairments - at baseline(family confirms that he seems himself, speech is unchanged)                                        General Comments      Exercises     Assessment/Plan    PT Assessment Patient needs continued PT services  PT Problem List Decreased strength;Decreased range of motion;Decreased activity tolerance;Decreased balance;Decreased mobility;Decreased cognition;Decreased knowledge of use of DME;Decreased safety awareness;Pain       PT Treatment Interventions DME instruction;Gait training;Stair training;Functional mobility training;Therapeutic activities;Therapeutic exercise;Balance training;Cognitive remediation;Neuromuscular re-education;Patient/family education    PT Goals (Current goals can be found in the Care Plan section)  Acute Rehab PT Goals Patient Stated Goal: go to rehab to get back to walking PT Goal Formulation: With patient Time For Goal Achievement: 12/17/17 Potential to Achieve Goals: Fair    Frequency 7X/week   Barriers to discharge        Co-evaluation                AM-PAC PT "6 Clicks" Daily Activity  Outcome Measure Difficulty turning over in bed (including adjusting bedclothes, sheets and blankets)?: A Lot Difficulty moving from lying on back to sitting on the side of the bed? : Unable Difficulty sitting down on and standing up from a chair with arms (e.g., wheelchair, bedside commode, etc,.)?: Unable Help needed moving to and from a bed to chair (including a wheelchair)?: A Lot Help needed walking in hospital room?: Total Help needed climbing 3-5 steps with a railing? : Total 6 Click Score: 8    End of Session Equipment Utilized During Treatment: Gait belt Activity Tolerance: Patient tolerated treatment well;Patient limited by pain Patient left: with chair alarm set;with call bell/phone within reach;with family/visitor present   PT Visit Diagnosis: Muscle weakness (generalized) (M62.81);Difficulty in walking, not elsewhere classified (R26.2);Pain Pain - Right/Left: Left Pain - part of body: Knee    Time: 5400-8676 PT Time Calculation (min) (ACUTE ONLY): 32 min   Charges:   PT Evaluation $PT Eval Low Complexity: 1 Low          Kreg Shropshire, DPT 12/03/2017, 2:59 PM

## 2017-12-03 NOTE — Progress Notes (Signed)
[facility] [place] NAME: Derek Shelton    MR#:  [id number]  DATE OF BIRTH:  [birth year]  SUBJECTIVE:  CHIEF COMPLAINT:   Chief Complaint  Patient presents with  . Fall  . Leg Pain   Left-sided weakness. REVIEW OF SYSTEMS:  Review of Systems  Constitutional: Negative for chills, fever and malaise/fatigue.  HENT: Negative for sore throat.   Eyes: Negative for blurred vision and double vision.  Respiratory: Negative for cough, hemoptysis, shortness of breath, wheezing and stridor.   Cardiovascular: Negative for chest pain, palpitations, orthopnea and leg swelling.  Gastrointestinal: Negative for abdominal pain, blood in stool, diarrhea, melena, nausea and vomiting.  Genitourinary: Negative for dysuria, flank pain and hematuria.  Musculoskeletal: Negative for back pain and joint pain.  Neurological: Positive for focal weakness. Negative for dizziness, sensory change, seizures, loss of consciousness, weakness and headaches.  Endo/Heme/Allergies: Negative for polydipsia.  Psychiatric/Behavioral: Negative for depression. The patient is not nervous/anxious.     DRUG ALLERGIES:   Allergies  Allergen Reactions  . Catapres [Clonidine Hcl]   . Coreg [Carvedilol]   . Penicillins    VITALS:  Blood pressure 130/68, pulse 75, temperature 97.8 F (36.6 C), temperature source Oral, resp. rate 18, height 5\' 4"  (1.626 m), weight 87.2 kg, SpO2 99 %. PHYSICAL EXAMINATION:  Physical Exam  Constitutional: He is oriented to person, place, and time. No distress.  HENT:  Head: Normocephalic.  Mouth/Throat: Oropharynx is clear and moist.  Eyes: Pupils are equal, round, and reactive to light. Conjunctivae and EOM are normal. No scleral icterus.  Neck: Normal range of motion. Neck supple. No JVD present. No tracheal deviation present.  Cardiovascular: Normal rate, regular rhythm and normal heart sounds. Exam reveals no gallop.  No murmur  heard. Pulmonary/Chest: Effort normal and breath sounds normal. No respiratory distress. He has no wheezes. He has no rales.  Abdominal: Soft. Bowel sounds are normal. He exhibits no distension. There is no tenderness. There is no rebound.  Musculoskeletal: Normal range of motion. He exhibits no edema or tenderness.  Neurological: He is alert and oriented to person, place, and time. No cranial nerve deficit.  Skin: No rash noted. No erythema.  Psychiatric: He has a normal mood and affect.   LABORATORY PANEL:  Male CBC Recent Labs  Lab 12/03/17 0940  WBC 7.6  HGB 15.1  HCT 45.1  PLT 185   ------------------------------------------------------------------------------------------------------------------ Chemistries  Recent Labs  Lab 12/03/17 0940  NA 135  K 3.6  CL 97*  CO2 29  GLUCOSE 215*  BUN 12  CREATININE 0.91  CALCIUM 8.9   RADIOLOGY:  Dg Chest 2 View  Result Date: 12/02/2017 CLINICAL DATA:  Acute ischemic stroke. EXAM: CHEST - 2 VIEW COMPARISON:  Radiographs of May 22, 2017. FINDINGS: Stable cardiomegaly. No pneumothorax or pleural effusion is noted. No acute pulmonary disease is noted. Bony thorax is unremarkable. IMPRESSION: No active cardiopulmonary disease. Electronically Signed   By: Marijo Conception, M.D.   On: 12/02/2017 19:38   US Carotid Bilateral (at [facility] Only)  Result Date: 12/03/2017 CLINICAL DATA:  77 year old male with acute right posterior limb internal capsule infarct EXAM: BILATERAL CAROTID DUPLEX ULTRASOUND TECHNIQUE: Pearline Cables scale imaging, color Doppler and duplex ultrasound were performed of bilateral carotid and vertebral arteries in the neck. COMPARISON:  None. FINDINGS: Criteria: Quantification of carotid stenosis is based on velocity parameters that correlate the residual internal carotid diameter with NASCET-based stenosis levels, using the  diameter of the distal internal carotid lumen as the denominator for stenosis measurement. The  following velocity measurements were obtained: RIGHT ICA: 44/9 cm/sec CCA: 03/8 cm/sec SYSTOLIC ICA/CCA RATIO:  0.7 ECA:  68 cm/sec LEFT ICA: 53/15 cm/sec CCA: 33/38 cm/sec SYSTOLIC ICA/CCA RATIO:  1.1 ECA:  57 cm/sec RIGHT CAROTID ARTERY: No significant atherosclerotic plaque or evidence of stenosis. Of note, the carotid bifurcation is high. RIGHT VERTEBRAL ARTERY:  Patent with normal antegrade flow. LEFT CAROTID ARTERY: No significant atherosclerotic plaque or evidence of stenosis. The carotid bifurcation is somewhat high. LEFT VERTEBRAL ARTERY:  Patent with normal antegrade flow. IMPRESSION: 1. No evidence of significant stenosis or atherosclerotic plaque in either internal carotid artery. 2. Vertebral arteries are patent with normal antegrade flow. Signed, Criselda Peaches, MD, [facility] Electronically Signed   By: Jacqulynn Cadet M.D.   On: 12/03/2017 08:16   ASSESSMENT AND PLAN:   *Acute stroke Carotid duplex is unremarkable. Patient had echocardiogram done 3 days ago so no need to repeat. Consistent with small vessel embolic stroke in setting of A-fib, continue Eliquis at the same dose per Dr. Irish Elders.  Follow-up PT and OT.  *Paroxysmal atrial fibrillation Continue digoxin and metoprolol.  Continue Eliquis.  Hyperlipidemia. LDL 110, start Lipitor.  *Hypertension Continue metoprolol but hold benazepril to avoid blood pressure slightly on the high side for perfusion.  *Diabetes Sliding scale coverage and continue home medications.  *Chronic diastolic heart failure Continue Lasix.  No exacerbation symptoms.  *COPD Continue DuoNeb.  Discussed with Dr. Irish Elders. All the records are reviewed and case discussed with Care Management/Social Worker. Management plans discussed with the patient, his wife and they are in agreement.  CODE STATUS: Full Code  TOTAL TIME TAKING CARE OF THIS PATIENT: 14minutes.    More than 50% of the time was spent in counseling/coordination of care: YES  POSSIBLE D/C IN 1-2 DAYS, DEPENDING ON CLINICAL CONDITION.   Demetrios Loll M.D on 12/03/2017 at 2:36 PM  Between 7am to 6pm - Pager - [phone]  After 6pm go to [email or web address] - [facility]

## 2017-12-03 NOTE — Progress Notes (Addendum)
Per CCMD pt had 2.4 sec pause, Jake Bathe, NP notified. No new orders received. Will continue to monitor.

## 2017-12-04 LAB — GLUCOSE, CAPILLARY
GLUCOSE-CAPILLARY: 186 mg/dL — AB (ref 70–99)
GLUCOSE-CAPILLARY: 199 mg/dL — AB (ref 70–99)
Glucose-Capillary: 167 mg/dL — ABNORMAL HIGH (ref 70–99)
Glucose-Capillary: 203 mg/dL — ABNORMAL HIGH (ref 70–99)

## 2017-12-04 MED ORDER — METOPROLOL TARTRATE 50 MG PO TABS
50.0000 mg | ORAL_TABLET | Freq: Once | ORAL | Status: AC
  Administered 2017-12-04: 50 mg via ORAL
  Filled 2017-12-04: qty 1

## 2017-12-04 MED ORDER — METOPROLOL TARTRATE 25 MG PO TABS
25.0000 mg | ORAL_TABLET | Freq: Three times a day (TID) | ORAL | Status: DC
  Administered 2017-12-04 – 2017-12-06 (×7): 25 mg via ORAL
  Filled 2017-12-04 (×7): qty 1

## 2017-12-04 MED ORDER — ENOXAPARIN SODIUM 100 MG/ML ~~LOC~~ SOLN
1.0000 mg/kg | Freq: Two times a day (BID) | SUBCUTANEOUS | Status: DC
  Administered 2017-12-04 – 2017-12-05 (×2): 85 mg via SUBCUTANEOUS
  Filled 2017-12-04 (×3): qty 1

## 2017-12-04 NOTE — Progress Notes (Signed)
Dr. Humphrey Rolls notified of Pt's HR sustaining in the 140s Afib with RVR, also BP elevated 140/111, SPO2 95% on r/a, Per Dr. Humphrey Rolls give one time dose of metoprolol 50 mg. Will give and continue to monitor.

## 2017-12-04 NOTE — Progress Notes (Signed)
Inpatient Diabetes Program Recommendations  AACE/ADA: New Consensus Statement on Inpatient Glycemic Control (2015)  Target Ranges:  Prepandial:   less than 140 mg/dL      Peak postprandial:   less than 180 mg/dL (1-2 hours)      Critically ill patients:  140 - 180 mg/dL   Results for DONTEL, HARSHBERGER (MRN [id number]) as of 12/04/2017 14:26  Ref. Range 12/03/2017 11:57 12/03/2017 16:52 12/03/2017 21:09 12/04/2017 07:12 12/04/2017 11:53  Glucose-Capillary Latest Ref Range: 70 - 99 mg/dL 198 (H) 203 (H) 218 (H) 167 (H) 186 (H)   Review of Glycemic Control  Diabetes history: DM 2 Outpatient Diabetes medications: Amaryl 3 mg BID, Tresiba 15 units qhs Current orders for Inpatient glycemic control: Novolog 0-9 units tid  A1c 8.9% on 11/9  Inpatient Diabetes Program Recommendations:    Glucose trends elevated above inpatient goal.   Please consider increasing Correction scale to Novolog 0-15 units tid and consider adding Novolog 0-5 units hs scale.  Thanks,  Tama Headings RN, MSN, BC-ADM Inpatient Diabetes Coordinator Team Pager [phone] (8a-5p)

## 2017-12-04 NOTE — Evaluation (Signed)
Clinical/Bedside Swallow Evaluation Patient Details  Name: Derek Shelton MRN: [id number] Date of Birth: [birth year]  Today's Date: 12/04/2017 Time: SLP Start Time (ACUTE ONLY): 0910 SLP Stop Time (ACUTE ONLY): 0940 SLP Time Calculation (min) (ACUTE ONLY): 30 min  Past Medical History:  Past Medical History:  Diagnosis Date  . Anemia   . Asthma   . CHF (congestive heart failure) ([place])   . COPD (chronic obstructive pulmonary disease) ([place])   . Coronary artery disease   . Dementia ([place])    Per son's report  . Diabetes mellitus without complication ([place])   . Dysrhythmia    Atrial Fibrillation  . GERD (gastroesophageal reflux disease)   . Hyperlipidemia   . Hypertension   . Hypothyroidism   . Shortness of breath dyspnea   . Sleep apnea   . Thyroid disease    Past Surgical History:  Past Surgical History:  Procedure Laterality Date  . CARDIAC CATHETERIZATION    . COLONOSCOPY N/A 08/10/2014   Procedure: COLONOSCOPY;  Surgeon: Manya Silvas, MD;  Location: [facility] ENDOSCOPY;  Service: Endoscopy;  Laterality: N/A;  . COLONOSCOPY WITH PROPOFOL N/A 04/05/2016   Procedure: COLONOSCOPY WITH PROPOFOL;  Surgeon: Lollie Sails, MD;  Location: [facility] ENDOSCOPY;  Service: Endoscopy;  Laterality: N/A;  . ESOPHAGOGASTRODUODENOSCOPY N/A 08/08/2014   Procedure: ESOPHAGOGASTRODUODENOSCOPY (EGD);  Surgeon: Manya Silvas, MD;  Location: [facility] ENDOSCOPY;  Service: Endoscopy;  Laterality: N/A;  plan for early afternoon  . EYE SURGERY    . GIVENS CAPSULE STUDY N/A 08/12/2014   Procedure: GIVENS CAPSULE STUDY;  Surgeon: Manya Silvas, MD;  Location: [facility] ENDOSCOPY;  Service: Endoscopy;  Laterality: N/A;  . rectal fistula N/A    HPI:  Per admitting H&P: Derek Shelton  is a 77 y.o. male with a known history of asthma, congestive heart failure, COPD, coronary artery disease, dementia, diabetes without complication, atrial fibrillation, hyperlipidemia, hypertension, hypothyroidism, sleep apnea-had  episode of weakness on left side and confusion 3 days ago while out for shopping.  He was taken to [facility]-where after MRI and echocardiogram found negative for acute stroke, suspected to have this episode due to hypoglycemia and sent home.   Assessment / Plan / Recommendation Clinical Impression  Bedside swallow evaluation completed per MD note on H&P POC for swallow evaluation. Patient presents with functional swallowing abilities at bedside with no overt s/s aspiration with any consistency tested. Oral mech exam WFL, except for mild lingual deviation to R which did not affect function. Oral phase of swallow [facility] demonstrating timely oral prep, coordination and A-P transit. No evidence of oral residue. Swallow initiation appeared timely. Vocal quality remained clear throughout evaluation. Nursing and family report toleration of current Regular diet with no s/s aspiration. Recommend continue with current diet with aspiration precautions, give meds whole with liquid. Screened speech/lang/cognition per MD orders for Cognitive Linguistic evaluation. Family reports speech, language and cognition at baseline status with no acute changes in severity of patient's baseline dementia. Patient easily follows commands and responds appropriately to all wh-?'s presented. Family reports patient often does not choose to speak much at home, however is noted to actively listen to conversations surrounding him, found to comment on previously heard conversations at a later time. Reviewed safe swallow recommendations and results of evaluation with family, patient and nursing. SLP to f/u with toleration of diet. SLP Visit Diagnosis: Dysphagia, unspecified (R13.10)    Aspiration Risk  Mild aspiration risk    Diet Recommendation Regular;Thin liquid   Liquid  Administration via: Cup;Straw Medication Administration: Whole meds with liquid Supervision: Patient able to self feed Compensations: Minimize environmental  distractions;Slow rate;Small sips/bites Postural Changes: Seated upright at 90 degrees    Other  Recommendations Oral Care Recommendations: Oral care BID   Follow up Recommendations        Frequency and Duration min 2x/week  1 week       Prognosis Prognosis for Safe Diet Advancement: Good      Swallow Study   General Date of Onset: 12/04/17 HPI: Per admitting H&P: Derek Shelton  is a 77 y.o. male with a known history of asthma, congestive heart failure, COPD, coronary artery disease, dementia, diabetes without complication, atrial fibrillation, hyperlipidemia, hypertension, hypothyroidism, sleep apnea-had episode of weakness on left side and confusion 3 days ago while out for shopping.  He was taken [facility]-where after MRI and echocardiogram found negative for acute stroke, suspected to have this episode due to hypoglycemia and sent home. Type of Study: Bedside Swallow Evaluation Diet Prior to this Study: Regular;Thin liquids Temperature Spikes Noted: No Respiratory Status: Room air History of Recent Intubation: No Behavior/Cognition: Alert;Cooperative;Pleasant mood Oral Cavity Assessment: Within Functional Limits Oral Care Completed by SLP: No Oral Cavity - Dentition: Adequate natural dentition Vision: Functional for self-feeding Self-Feeding Abilities: Able to feed self Patient Positioning: Upright in bed Baseline Vocal Quality: Normal    Oral/Motor/Sensory Function Overall Oral Motor/Sensory Function: Within functional limits   Ice Chips Ice chips: Not tested   Thin Liquid Thin Liquid: Within functional limits Presentation: Cup;Self Fed;Straw    Nectar Thick Nectar Thick Liquid: Not tested   Honey Thick Honey Thick Liquid: Not tested   Puree Puree: Within functional limits Presentation: Self Fed;Spoon   Solid     Solid: Within functional limits Presentation: [place], MA, CCC-SLP 12/04/2017,9:47 AM

## 2017-12-04 NOTE — Evaluation (Signed)
Occupational Therapy Evaluation Patient Details Name: Derek Shelton MRN: [id number] DOB: [birth year] Today's Date: 12/04/2017    History of Present Illness 77 y.o. male with a known history of asthma, congestive heart failure, COPD, coronary artery disease, dementia, diabetes without complication, atrial fibrillation, hyperlipidemia, hypertension, hypothyroidism, sleep apnea-had episode of weakness on left side and confusion 3 days ago while out for shopping.  He was taken [facility]-where after MRI and echocardiogram found negative for acute stroke, suspected to have this episode due to hypoglycemia and sent home.  A few days later (11/7) he again had L side weakness, confusion and 2 falls.  CT and MRI confirm new CVA.   Clinical Impression   Pt seen for OT evaluation this date. All communication presented with interpreter, Sajjad, in the room. Pt is a 77 y/o male who presented with CVA. Pt lives with his spouse in a house with 3 steps to enter. Prior to admission, pt was independent with his ADL tasks with spouse available 24/7 for any assistance. Pt used a SPC for mobility in the community. Pt was A&O x2. A detailed neuro assessment was effected by pt baseline cognitive deficits. Currently, pt demonstrates impairments in pain, safety awareness, activity tolerance, decreased knowledge of DME/AE, ROM, strength, and cognition requiring VC for sequencing and higher level problem solving tasks as well as Min A for LB dressing and bathing in a seated position. Pt will benefit from skilled OT services to address noted impairments in order to maximize safety and independence and minimize falls risk and caregiver burden.    Follow Up Recommendations  SNF    Equipment Recommendations  3 in 1 bedside commode    Recommendations for Other Services       Precautions / Restrictions Precautions Precautions: Fall Restrictions Weight Bearing Restrictions: No      Mobility Bed  Mobility Overal bed mobility: Needs Assistance Bed Mobility: Supine to Sit; Sit to Supine     Supine to sit: Min assist Sit to supine: Min guard   General bed mobility comments: Pt able to get BLE off bed; use BUE on bed rails for assistance to rotate body in line with hips.   Transfers Overall transfer level: Needs assistance Equipment used: Rolling walker (2 wheeled) Transfers: Sit to/from Stand Sit to Stand: Min assist;+2 safety/equipment         General transfer comment: Pt required VC for upright posture and cues for sequencing and higher level problem solving when side stepping with RW.    Balance Overall balance assessment: Needs assistance Sitting-balance support: Bilateral upper extremity supported;Feet supported Sitting balance-Leahy Scale: Good Sitting balance - Comments: Pt able to maintain sitting balance relatively well at EOB, no leaning or LOBs   Standing balance support: Bilateral upper extremity supported Standing balance-Leahy Scale: Poor Standing balance comment: Pt highly reliant on the walker; VC to maintain posture;                            ADL either performed or assessed with clinical judgement   ADL Overall ADL's : Needs assistance/impaired Eating/Feeding: Independent;Sitting   Grooming: Independent;Sitting   Upper Body Bathing: Supervision/ safety;Sitting   Lower Body Bathing: Minimal assistance;Sitting/lateral leans   Upper Body Dressing : Supervision/safety;Sitting   Lower Body Dressing: Minimal assistance;Sit to/from stand   Toilet Transfer: deferred; unsafe to attempt           Functional mobility during ADLs: Deferred; unsafe to attempt  Vision Baseline Vision/History: No visual deficits Vision Assessment?: Vision impaired- to be further tested in functional context Additional Comments: During convergence test pt left eye moved inwards; no movement of R eye. Detailed neuro assessment impaired by baseline  cognitive deficits.     Perception     Praxis      Pertinent Vitals/Pain Pain Assessment: No/denies pain     Hand Dominance Right   Extremity/Trunk Assessment Upper Extremity Assessment Upper Extremity Assessment: LUE deficits/detail;Overall WFL for tasks assessed(Pt has BUE full sensation, coordination  and no numbness.) RUE Deficits / Details: WFL RUE Sensation: WNL RUE Coordination: WNL LUE Deficits / Details: Pt demonstrated LUE pronator drift; Shoulder 4/5   Lower Extremity Assessment Lower Extremity Assessment: Generalized weakness(Pt able to lift BLE off bed; 5/5 LLE and 4/5 RLE)       Communication Communication Communication: Prefers language other than English(Interpreter, Sajjad, used for all communication during session. )   Cognition Arousal/Alertness: Awake/alert Behavior During Therapy: WFL for tasks assessed/performed Overall Cognitive Status: History of cognitive impairments - at baseline Area of Impairment: Orientation;Problem solving    General comments: Per PT note, pt at cognitive baseline and speech unchanged. ; A&O x2. Pt required extra time to process higher level problem solving commands.                           General Comments: Per PT note, pt at cognitive baseline and speech unchanged. ; A&O x2. Pt required extra time to process higher level problem solving commands.   General Comments       Exercises     Shoulder Instructions      Home Living Family/patient expects to be discharged to:: Home Living Arrangements: Spouse/significant other;Children Available Help at Discharge: Family;Available 24 hours/day Type of Home: House Home Access: Stairs to enter [employer] of Steps: 3 + 3 Entrance Stairs-Rails: Right Home Layout: One level               Home Equipment: Cane - single point          Prior Functioning/Environment Level of Independence: Independent with assistive device(s)        Comments:  Mod Ind with amb limited community distances with SPC, no fall history apart from these recent 2.  Has had issues with b/l knee OA        OT Problem List: Decreased strength;Decreased cognition;Pain;Decreased range of motion;Decreased safety awareness;Decreased activity tolerance;Decreased knowledge of use of DME or AE;Impaired UE functional use      OT Treatment/Interventions: Self-care/ADL training;DME and/or AE instruction;Therapeutic activities;Therapeutic exercise;Balance training;Cognitive remediation/compensation;Patient/family education;Energy conservation    OT Goals(Current goals can be found in the care plan section) Acute Rehab OT Goals Patient Stated Goal: get back to PLOF OT Goal Formulation: With patient/family Time For Goal Achievement: 12/18/17 Potential to Achieve Goals: Good ADL Goals Pt Will Perform Lower Body Dressing: with supervision;sit to/from stand Additional ADL Goal #1: Pt will utilize cognitive compenstory strategies with VC to complete ADL task. Additional ADL Goal #2: Pt will utilize rest breaks and at least one activity pacing strategy when completing ADL tasks.  OT Frequency: Min 2X/week   Barriers to D/C:            Co-evaluation              AM-PAC PT "6 Clicks" Daily Activity     Outcome Measure Help from another person eating meals?: None Help from another person taking care  of personal grooming?: None Help from another person toileting, which includes using toliet, bedpan, or urinal?: A Little Help from another person bathing (including washing, rinsing, drying)?: A Little Help from another person to put on and taking off regular upper body clothing?: A Little Help from another person to put on and taking off regular lower body clothing?: A Little 6 Click Score: 20   End of Session Equipment Utilized During Treatment: Gait belt;Rolling walker Nurse Communication: Other (comment)(Nurse tech notified for clean change of gown, linens,  diaper)  Activity Tolerance: Patient tolerated treatment well Patient left: in bed;with family/visitor present;with call bell/phone within reach  OT Visit Diagnosis: Other symptoms and signs involving cognitive function;Hemiplegia and hemiparesis Hemiplegia - Right/Left: Left Hemiplegia - dominant/non-dominant: Non-Dominant Hemiplegia - caused by: Cerebral infarction                Time: 3664-4034 OT Time Calculation (min): 42 min Charges:     Jadene Pierini OTS  12/04/2017, 2:42 PM

## 2017-12-04 NOTE — Progress Notes (Signed)
[facility] NAME: Derek Shelton    MR#:  [id number]  DATE OF BIRTH:  November 05, 1940  SUBJECTIVE:  CHIEF COMPLAINT:   Chief Complaint  Patient presents with  . Fall  . Leg Pain   Left-sided weakness. Family at bedside REVIEW OF SYSTEMS:  Review of Systems  Constitutional: Negative for chills, fever and malaise/fatigue.  HENT: Negative for sore throat.   Eyes: Negative for blurred vision and double vision.  Respiratory: Negative for cough, hemoptysis, shortness of breath, wheezing and stridor.   Cardiovascular: Negative for chest pain, palpitations, orthopnea and leg swelling.  Gastrointestinal: Negative for abdominal pain, blood in stool, diarrhea, melena, nausea and vomiting.  Genitourinary: Negative for dysuria, flank pain and hematuria.  Musculoskeletal: Negative for back pain and joint pain.  Neurological: Positive for focal weakness. Negative for dizziness, sensory change, seizures, loss of consciousness, weakness and headaches.  Endo/Heme/Allergies: Negative for polydipsia.  Psychiatric/Behavioral: Negative for depression. The patient is not nervous/anxious.     DRUG ALLERGIES:   Allergies  Allergen Reactions  . Catapres [Clonidine Hcl]   . Coreg [Carvedilol]   . Penicillins    VITALS:  Blood pressure 127/87, pulse 61, temperature 97.6 F (36.4 C), temperature source Oral, resp. rate 18, height 5\' 4"  (1.626 m), weight 87.2 kg, SpO2 95 %. PHYSICAL EXAMINATION:  Physical Exam  Constitutional: He is oriented to person, place, and time. No distress.  HENT:  Head: Normocephalic.  Mouth/Throat: Oropharynx is clear and moist.  Eyes: Pupils are equal, round, and reactive to light. Conjunctivae and EOM are normal. No scleral icterus.  Neck: Normal range of motion. Neck supple. No JVD present. No tracheal deviation present.  Cardiovascular: Normal rate, regular rhythm and normal heart sounds. Exam reveals no gallop.  No  murmur heard. Pulmonary/Chest: Effort normal and breath sounds normal. No respiratory distress. He has no wheezes. He has no rales.  Abdominal: Soft. Bowel sounds are normal. He exhibits no distension. There is no tenderness. There is no rebound.  Musculoskeletal: Normal range of motion. He exhibits no edema or tenderness.  Neurological: He is alert and oriented to person, place, and time. No cranial nerve deficit.  Decreased left motor strength  Skin: No rash noted. No erythema.  Psychiatric: He has a normal mood and affect.   LABORATORY PANEL:  Male CBC Recent Labs  Lab 12/03/17 0940  WBC 7.6  HGB 15.1  HCT 45.1  PLT 185   ------------------------------------------------------------------------------------------------------------------ Chemistries  Recent Labs  Lab 12/03/17 0940  NA 135  K 3.6  CL 97*  CO2 29  GLUCOSE 215*  BUN 12  CREATININE 0.91  CALCIUM 8.9   RADIOLOGY:  No results found. ASSESSMENT AND PLAN:   * Acute right internal capsule CVA Carotid duplex is unremarkable. Patient had echocardiogram done 3 days prior to admission and did not show any thrombus Consistent with small vessel embolic stroke in setting of A-fib, continue Eliquis at the same dose per Dr. Irish Elders.  Follow-up PT and OT.  * Paroxysmal atrial fibrillation with episodes of bradycardia Discussed with Dr. Yancey Flemings of cardiology.  Patient will need pacemaker.  Tachycardic today and will restart metoprolol.  Hold digoxin. Consulted Dr. Stephani Police of cardiology for pacemaker placement.  Dr. Yancey Flemings to discuss with him.  * Hyperlipidemia. LDL 110, start Lipitor.  * Hypertension Continue metoprolol but hold benazepril to avoid blood pressure slightly on the high side for perfusion.  * Diabetes Sliding scale coverage and continue home  medications.  * Chronic diastolic heart failure Continue Lasix.  No exacerbation symptoms.  * COPD Continue DuoNeb.  All the records are reviewed and  case discussed with Care Management/Social Worker. Management plans discussed with the patient, his wife and they are in agreement.  CODE STATUS: Full Code  TOTAL TIME TAKING CARE OF THIS PATIENT: 35 minutes.   POSSIBLE D/C IN 1-2 DAYS, DEPENDING ON CLINICAL CONDITION.  Neita Carp M.D on 12/04/2017 at 11:51 AM  Between 7am to 6pm - Pager - [phone]  After 6pm go to [email or web address] - [facility]

## 2017-12-04 NOTE — Plan of Care (Signed)
  Problem: Education: Goal: Knowledge of General Education information will improve Description Including pain rating scale, medication(s)/side effects and non-pharmacologic comfort measures Outcome: Progressing   Problem: Health Behavior/Discharge Planning: Goal: Ability to manage health-related needs will improve Outcome: Progressing   Problem: Education: Goal: Knowledge of disease or condition will improve Outcome: Progressing Goal: Knowledge of secondary prevention will improve Outcome: Progressing Goal: Knowledge of patient specific risk factors addressed and post discharge goals established will improve Outcome: Progressing

## 2017-12-04 NOTE — Clinical Social Work Note (Signed)
Clinical Social Work Assessment  Patient Details  Name: Derek Shelton MRN: [id number] Date of Birth: 01-Jan-1941  Date of referral:  12/04/17               Reason for consult:  Facility Placement                Permission sought to share information with:  Case Manager, Customer service manager, Family Supports Permission granted to share information::  Yes, Verbal Permission Granted  Name::      SNF  Agency::   [facility]   Relationship::     Contact Information:     Housing/Transportation Living arrangements for the past 2 months:  [place] of Information:  Adult Children Patient Interpreter Needed:  Other (Comment Required)(Urdu) Criminal Activity/Legal Involvement Pertinent to Current Situation/Hospitalization:  No - Comment as needed Significant Relationships:  Adult Children, Spouse Lives with:  Spouse, Adult Children Do you feel safe going back to the place where you live?  Yes Need for family participation in patient care:  Yes (Comment)  Care giving concerns:  Patient lives with wife and son in [place]    Social Worker assessment / plan:  CSW consulted for SNF placement. CSW attempted to meet with patient and wife but they were unavailable. CSW contacted son Derek Shelton [phone]. CSW explained to son that PT is recommending SNF. Son states that patient lives with him, his sister and his mother. Son states that due to language barrier and Dementia, they believe that it would be better for patient to return home with home health. CSW notified RNCM of above. CSW signing off. Please re consult if further needs arise.   Employment status:  Retired Nurse, adult PT Recommendations:  [place] / Referral to community resources:  [place]  Patient/Family's Response to care:  Son thanked CSW for assistance   Patient/Family's Understanding of and Emotional Response to  Diagnosis, Current Treatment, and Prognosis:  Son states understanding of current treatment and plan   Emotional Assessment Appearance:  Appears stated age Attitude/Demeanor/Rapport:    Affect (typically observed):  Quiet, Pleasant Orientation:  Oriented to Self, Oriented to Place Alcohol / Substance use:  Not Applicable Psych involvement (Current and /or in the community):  No (Comment)  Discharge Needs  Concerns to be addressed:  Discharge Planning Concerns Readmission within the last 30 days:  No Current discharge risk:  Cognitively Impaired, Dependent with Mobility Barriers to Discharge:  Continued Medical Work up   [employer], [place] 12/04/2017, 5:12 PM

## 2017-12-04 NOTE — Progress Notes (Signed)
Physical Therapy Treatment Patient Details Name: Derek Shelton MRN: [id number] DOB: [birth year] Today's Date: 12/04/2017    History of Present Illness 77 y.o. male with a known history of asthma, congestive heart failure, COPD, coronary artery disease, dementia, diabetes without complication, atrial fibrillation, hyperlipidemia, hypertension, hypothyroidism, sleep apnea-had episode of weakness on left side and confusion 3 days ago while out for shopping.  He was taken [facility]-where after MRI and echocardiogram found negative for acute stroke, suspected to have this episode due to hypoglycemia and sent home.  A few days later (11/7) he again had L side weakness, confusion and 2 falls.  CT and MRI confirm new CVA.    PT Comments    Patient alert, agreeable to PT, family at bedside. Interpretor utilized throughout session Building control surveyor). Patient able to mobilize to EOB minAx1 with extended time, heavy use of bed rails, reaching for assistance. Sit to stand with modAx1, Able to take shuffling steps to recliner ~73ft with RW and modAx1, difficulty with moving/controlling LLE. Pt impulsive to sit, poor eccentric control, PT modAx1 to ensure safety while sitting. Pt able to perform seated exercises with verbal cues and demonstration. Standing marching attempted with RW and modAx1 from PT, patient complained of significant L knee pain, did not want to continue exercises. Extensive time spent with family concerning condition, POC, rehab, etc. The patient would benefit from further skilled PT to continue to progress towards goals.     Follow Up Recommendations  SNF     Equipment Recommendations  Other (comment)(TBD, most likely will need RW)    Recommendations for Other Services       Precautions / Restrictions Precautions Precautions: Fall Restrictions Weight Bearing Restrictions: No    Mobility  Bed Mobility Overal bed mobility: Needs Assistance Bed Mobility: Supine to Sit      Supine to sit: Min assist     General bed mobility comments: Extended time needed, reaches for assistance  Transfers Overall transfer level: Needs assistance Equipment used: Rolling walker (2 wheeled) Transfers: Sit to/from Stand Sit to Stand: Mod assist         General transfer comment: Pt required VC for upright posture and cues for sequencing and higher level problem solving when side stepping with RW.  Ambulation/Gait Ambulation/Gait assistance: Mod assist Gait Distance (Feet): 3 Feet Assistive device: Rolling walker (2 wheeled)   Gait velocity: decreased   General Gait Details: Pt needed modAx1 to achieve standing position and then also for guidance for sitting in recliner, pt impulsive. Poor eccentric control noted. Difficulty controlling LLE (TKE), shuffling step.   Stairs             Wheelchair Mobility    Modified Rankin (Stroke Patients Only)       Balance Overall balance assessment: Needs assistance Sitting-balance support: Bilateral upper extremity supported;Feet supported Sitting balance-Leahy Scale: Fair Sitting balance - Comments: Pt able to maintain sitting balance relatively well at EOB, no leaning or LOBs   Standing balance support: Bilateral upper extremity supported Standing balance-Leahy Scale: Poor Standing balance comment: Pt highly reliant on the walker; VC to maintain posture;                             Cognition Arousal/Alertness: Awake/alert Behavior During Therapy: WFL for tasks assessed/performed Overall Cognitive Status: Difficult to assess Area of Impairment: Orientation;Problem solving  Orientation Level: Disoriented to;Time;Situation           Problem Solving: Slow processing;Requires verbal cues;Requires tactile cues General Comments: Intepretor utilized throughout session      Exercises Other Exercises Other Exercises: LAQ x20 ea side verbal/demo cues, seated marching x15  bilaterally verbal/demo. Standing marching x7 with modAx1, use of RW, pt complained of increased L knee pain, did not want to march any more.     General Comments        Pertinent Vitals/Pain Pain Assessment: No/denies pain    Home Living Family/patient expects to be discharged to:: Skilled nursing facility Living Arrangements: Spouse/significant other;Children Available Help at Discharge: Family;Available 24 hours/day Type of Home: House Home Access: Stairs to enter Entrance Stairs-Rails: Right Home Layout: One level Home Equipment: Cane - single point      Prior Function Level of Independence: Independent with assistive device(s)      Comments: Mod Ind with amb limited community distances with SPC, no fall history apart from these recent 2.  Has had issues with b/l knee OA   PT Goals (current goals can now be found in the care plan section) Acute Rehab PT Goals Patient Stated Goal: get back to PLOF Progress towards PT goals: Progressing toward goals    Frequency    7X/week      PT Plan Current plan remains appropriate    Co-evaluation              AM-PAC PT "6 Clicks" Daily Activity  Outcome Measure  Difficulty turning over in bed (including adjusting bedclothes, sheets and blankets)?: A Lot Difficulty moving from lying on back to sitting on the side of the bed? : Unable Difficulty sitting down on and standing up from a chair with arms (e.g., wheelchair, bedside commode, etc,.)?: Unable Help needed moving to and from a bed to chair (including a wheelchair)?: A Lot Help needed walking in hospital room?: Total Help needed climbing 3-5 steps with a railing? : Total 6 Click Score: 8    End of Session Equipment Utilized During Treatment: Gait belt Activity Tolerance: Patient limited by fatigue;Patient limited by pain Patient left: with chair alarm set;in chair;with family/visitor present;with nursing/sitter in room;with call bell/phone within reach Nurse  Communication: Mobility status PT Visit Diagnosis: Muscle weakness (generalized) (M62.81);Difficulty in walking, not elsewhere classified (R26.2);Pain Pain - Right/Left: Left Pain - part of body: Shoulder     Time: 1320-1350 PT Time Calculation (min) (ACUTE ONLY): 30 min  Charges:  $Therapeutic Exercise: 8-22 mins $Therapeutic Activity: 8-22 mins                     [name] PT, DPT 3:03 PM,12/04/17 [phone]

## 2017-12-04 NOTE — Progress Notes (Signed)
SUBJECTIVE: Patient is feeling much better   Vitals:   12/03/17 1654 12/03/17 1951 12/04/17 0508 12/04/17 0709  BP: 126/81 122/74 120/64 (!) 155/79  Pulse: (!) 150 70 (!) 58 61  Resp: 19 18 16 19   Temp:  98.2 F (36.8 C) 98.6 F (37 C) 97.6 F (36.4 C)  TempSrc:  Oral  Oral  SpO2: 93% 97% 94% 98%  Weight:      Height:        Intake/Output Summary (Last 24 hours) at 12/04/2017 0929 Last data filed at 12/04/2017 0700 Gross per 24 hour  Intake 1080 ml  Output 900 ml  Net 180 ml    LABS: Basic Metabolic Panel: Recent Labs    12/02/17 1214 12/03/17 0940  NA 136 135  K 3.6 3.6  CL 96* 97*  CO2 30 29  GLUCOSE 242* 215*  BUN 11 12  CREATININE 0.94 0.91  CALCIUM 9.3 8.9   Liver Function Tests: No results for input(s): AST, ALT, ALKPHOS, BILITOT, PROT, ALBUMIN in the last 72 hours. No results for input(s): LIPASE, AMYLASE in the last 72 hours. CBC: Recent Labs    12/02/17 1214 12/03/17 0940  WBC 7.7 7.6  HGB 16.1 15.1  HCT 48.6 45.1  MCV 88.7 88.3  PLT 200 185   Cardiac Enzymes: Recent Labs    12/02/17 1214  TROPONINI <0.03   BNP: Invalid input(s): POCBNP D-Dimer: No results for input(s): DDIMER in the last 72 hours. Hemoglobin A1C: Recent Labs    12/02/17 1214  HGBA1C 8.5*   Fasting Lipid Panel: Recent Labs    12/03/17 0940  CHOL 159  HDL 32*  LDLCALC 110*  TRIG 84  CHOLHDL 5.0   Thyroid Function Tests: No results for input(s): TSH, T4TOTAL, T3FREE, THYROIDAB in the last 72 hours.  Invalid input(s): FREET3 Anemia Panel: No results for input(s): VITAMINB12, FOLATE, FERRITIN, TIBC, IRON, RETICCTPCT in the last 72 hours.   PHYSICAL EXAM General: Well developed, well nourished, in no acute distress HEENT:  Normocephalic and atramatic Neck:  No JVD.  Lungs: Clear bilaterally to auscultation and percussion. Heart: HRRR . Normal S1 and S2 without gallops or murmurs.  Abdomen: Bowel sounds are positive, abdomen soft and non-tender  Msk:   Back normal, normal gait. Normal strength and tone for age. Extremities: No clubbing, cyanosis or edema.   Neuro: Alert and oriented X 3. 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  Apparently had a CVA based on MRI but has full strength in the upper and lower extremity and will get physical therapy.  Spoke to Dr. Raliegh Scarlet, and will arrange for permanent pacemaker evaluation.  Advise stopping Eliquis and start the patient on heparin.  Eliquis have will have to be stopped for 48 hours prior to pacemaker implantation.  Principal Problem:   Acute CVA (cerebrovascular accident) [facility]) Active Problems:   Acute ischemic stroke ([place])    Neoma Laming A, MD, [facility] 12/04/2017 9:29 AM

## 2017-12-04 NOTE — NC FL2 (Signed)
[place] LEVEL OF CARE SCREENING TOOL     IDENTIFICATION  Patient Name: Derek Shelton Birthdate: 28-Feb-1940 Sex: male Admission Date (Current Location): 12/02/2017  [place] and [place] Number:  Engineering geologist and Address:  [facility], [address] [place], [place] [address]      Provider Number: [id number]  Attending Physician Name and Address:  Derek Bow, MD  Relative Name and Phone Number:  Derek Shelton [phone]    Current Level of Care: Hospital Recommended Level of Care: [place] Prior Approval Number:    Date Approved/Denied:   PASRR Number: [id number]  Discharge Plan: SNF    Current Diagnoses: Patient Active Problem List   Diagnosis Date Noted  . Acute CVA (cerebrovascular accident) ([place]) 12/02/2017  . Acute ischemic stroke ([place]) 12/02/2017  . A-fib ([place]) 05/22/2017  . Chest pain 02/21/2017  . AKI (acute kidney injury) ([place]) 02/21/2017  . SOB (shortness of breath) 02/08/2017  . Atrial fibrillation with RVR ([place]) 02/08/2017  . GI bleeding 08/06/2014  . Anemia 08/06/2014  . Bradycardia 08/06/2014  . Atrial fibrillation ([place]) 08/06/2014  . Hyponatremia 08/06/2014  . Chronic diastolic heart failure ([place]) 08/06/2014  . Diabetes ([place]) 08/06/2014  . OSA (obstructive sleep apnea) 08/06/2014    Orientation RESPIRATION BLADDER Height & Weight     Self  Normal Continent Weight: 192 lb 4.8 oz (87.2 kg) Height:  5\' 4"  (162.6 cm)  BEHAVIORAL SYMPTOMS/MOOD NEUROLOGICAL BOWEL NUTRITION STATUS  (none) (none) Continent Diet(Heart Healthy )  AMBULATORY STATUS COMMUNICATION OF NEEDS Skin   Extensive Assist Verbally(Speaks Urdu) Normal                       Personal Care Assistance Level of Assistance  Bathing, Feeding, Dressing Bathing Assistance: Limited assistance Feeding assistance: Independent Dressing Assistance: Limited assistance     Functional Limitations Info   Sight, Hearing, Speech Sight Info: Adequate Hearing Info: Adequate Speech Info: Adequate(Urdu interpreter )    SPECIAL CARE FACTORS FREQUENCY  PT (By licensed PT), OT (By licensed OT)     PT Frequency: 5 OT Frequency: 5            Contractures Contractures Info: Not present    Additional Factors Info  Code Status, Allergies Code Status Info: Full Code  Allergies Info: Catapres Clonidine Hcl, Coreg Carvedilol, Penicillins           Current Medications (12/04/2017):  This is the current hospital active medication list Current Facility-Administered Medications  Medication Dose Route Frequency Provider Last Rate Last Dose  . acetaminophen (TYLENOL) tablet 650 mg  650 mg Oral Q4H PRN Vaughan Basta, MD       Or  . acetaminophen (TYLENOL) solution 650 mg  650 mg Per Tube Q4H PRN Vaughan Basta, MD       Or  . acetaminophen (TYLENOL) suppository 650 mg  650 mg Rectal Q4H PRN Vaughan Basta, MD      . atorvastatin (LIPITOR) tablet 40 mg  40 mg Oral q1800 Demetrios Loll, MD   40 mg at 12/03/17 1735  . diphenhydrAMINE (BENADRYL) capsule 25 mg  25 mg Oral Q6H PRN Demetrios Loll, MD   25 mg at 12/04/17 0631  . docusate sodium (COLACE) capsule 100 mg  100 mg Oral BID PRN Vaughan Basta, MD   100 mg at 12/04/17 1419  . donepezil (ARICEPT) tablet 5 mg  5 mg Oral QHS Vaughan Basta, MD   5 mg at  12/03/17 2103  . DULoxetine (CYMBALTA) DR capsule 60 mg  60 mg Oral Daily Vaughan Basta, MD   60 mg at 12/04/17 0841  . enoxaparin (LOVENOX) injection 85 mg  1 mg/kg Subcutaneous Q12H Derek Bow, MD   85 mg at 12/04/17 1413  . ferrous sulfate tablet 325 mg  325 mg Oral Daily Vaughan Basta, MD   325 mg at 12/04/17 0841  . finasteride (PROSCAR) tablet 5 mg  5 mg Oral Daily Vaughan Basta, MD   5 mg at 12/04/17 0841  . furosemide (LASIX) tablet 20 mg  20 mg Oral BID Vaughan Basta, MD   20 mg at 12/04/17 0841  .  guaiFENesin-dextromethorphan (ROBITUSSIN DM) 100-10 MG/5ML syrup 5 mL  5 mL Oral Q4H PRN Vaughan Basta, MD      . insulin aspart (novoLOG) injection 0-9 Units  0-9 Units Subcutaneous TID WC Vaughan Basta, MD   2 Units at 12/04/17 1224  . ipratropium-albuterol (DUONEB) 0.5-2.5 (3) MG/3ML nebulizer solution 3 mL  3 mL Inhalation Q6H PRN Vaughan Basta, MD   3 mL at 12/04/17 1419  . levothyroxine (SYNTHROID, LEVOTHROID) tablet 112 mcg  112 mcg Oral QAC breakfast Vaughan Basta, MD   112 mcg at 12/04/17 0631  . Melatonin TABS 2.5 mg  2.5 mg Oral QHS PRN Vaughan Basta, MD      . metoprolol tartrate (LOPRESSOR) tablet 25 mg  25 mg Oral Q8H Sudini, Alveta Heimlich, MD   25 mg at 12/04/17 0946  . mometasone-formoterol (DULERA) 200-5 MCG/ACT inhaler 2 puff  2 puff Inhalation BID Vaughan Basta, MD   2 puff at 12/04/17 0842  . omega-3 acid ethyl esters (LOVAZA) capsule 2 g  2 g Oral BID Vaughan Basta, MD   2 g at 12/04/17 0840  . pantoprazole (PROTONIX) EC tablet 40 mg  40 mg Oral Daily Vaughan Basta, MD   40 mg at 12/04/17 0841  . senna-docusate (Senokot-S) tablet 1 tablet  1 tablet Oral QHS PRN Vaughan Basta, MD         Discharge Medications: Please see discharge summary for a list of discharge medications.  Relevant Imaging Results:  Relevant Lab Results:   Additional Information SSN: [id number]  Annamaria Boots, [place]

## 2017-12-04 NOTE — Progress Notes (Signed)
OT Cancellation Note  Patient Details Name: Derek Shelton MRN: [id number] DOB: [birth year]   Cancelled Treatment:    Reason Eval/Treat Not Completed: Other (comment). Order received, chart reviewed. Spoke with interpreter services. They are coordinating to get an Urdu interpreter here in-person for OT evaluation this afternoon. Will also coordinate with PT to maximize efficiency and use of interpreter while on site. Will hold this am and re-attempt this afternoon with interpreter once scheduled.  Jeni Salles, MPH, MS, OTR/L ascom [phone] 12/04/17, 10:29 AM

## 2017-12-05 ENCOUNTER — Inpatient Hospital Stay: Payer: Medicare Other

## 2017-12-05 LAB — GLUCOSE, CAPILLARY
GLUCOSE-CAPILLARY: 207 mg/dL — AB (ref 70–99)
GLUCOSE-CAPILLARY: 281 mg/dL — AB (ref 70–99)
GLUCOSE-CAPILLARY: 308 mg/dL — AB (ref 70–99)
Glucose-Capillary: 158 mg/dL — ABNORMAL HIGH (ref 70–99)
Glucose-Capillary: 321 mg/dL — ABNORMAL HIGH (ref 70–99)

## 2017-12-05 LAB — BLOOD GAS, VENOUS
ACID-BASE EXCESS: 4.4 mmol/L — AB (ref 0.0–2.0)
Bicarbonate: 29.8 mmol/L — ABNORMAL HIGH (ref 20.0–28.0)
FIO2: 0.21
O2 SAT: 95 %
PCO2 VEN: 46 mmHg (ref 44.0–60.0)
Patient temperature: 37
pH, Ven: 7.42 (ref 7.250–7.430)
pO2, Ven: 74 mmHg — ABNORMAL HIGH (ref 32.0–45.0)

## 2017-12-05 LAB — BASIC METABOLIC PANEL
Anion gap: 10 (ref 5–15)
BUN: 15 mg/dL (ref 8–23)
CALCIUM: 8.8 mg/dL — AB (ref 8.9–10.3)
CO2: 28 mmol/L (ref 22–32)
Chloride: 96 mmol/L — ABNORMAL LOW (ref 98–111)
Creatinine, Ser: 1.03 mg/dL (ref 0.61–1.24)
GFR calc Af Amer: >60 mL/min (ref >60)
GFR calc non Af Amer: >60 mL/min (ref >60)
Glucose, Bld: 234 mg/dL — ABNORMAL HIGH (ref 70–99)
Potassium: 3.5 mmol/L (ref 3.5–5.1)
Sodium: 134 mmol/L — ABNORMAL LOW (ref 135–145)

## 2017-12-05 LAB — AMMONIA: Ammonia: 28 umol/L (ref 9–35)

## 2017-12-05 LAB — TSH: TSH: 2.721 u[IU]/mL (ref 0.350–4.500)

## 2017-12-05 MED ORDER — SENNOSIDES-DOCUSATE SODIUM 8.6-50 MG PO TABS
1.0000 | ORAL_TABLET | Freq: Two times a day (BID) | ORAL | Status: DC
  Administered 2017-12-05 – 2017-12-08 (×6): 1 via ORAL
  Filled 2017-12-05 (×6): qty 1

## 2017-12-05 MED ORDER — APIXABAN 5 MG PO TABS
5.0000 mg | ORAL_TABLET | Freq: Two times a day (BID) | ORAL | Status: DC
  Administered 2017-12-05 – 2017-12-08 (×7): 5 mg via ORAL
  Filled 2017-12-05 (×8): qty 1

## 2017-12-05 MED ORDER — LIDOCAINE HCL (PF) 1 % IJ SOLN
30.0000 mL | Freq: Once | INTRAMUSCULAR | Status: AC
  Administered 2017-12-05: 30 mL
  Filled 2017-12-05: qty 30

## 2017-12-05 MED ORDER — INSULIN ASPART 100 UNIT/ML ~~LOC~~ SOLN
5.0000 [IU] | Freq: Once | SUBCUTANEOUS | Status: AC
  Administered 2017-12-05: 5 [IU] via SUBCUTANEOUS
  Filled 2017-12-05: qty 1

## 2017-12-05 MED ORDER — APIXABAN 5 MG PO TABS: 5.0000 mg | ORAL_TABLET | Freq: Two times a day (BID) | ORAL | Status: DC

## 2017-12-05 MED ORDER — SODIUM CHLORIDE 0.9% FLUSH
3.0000 mL | Freq: Two times a day (BID) | INTRAVENOUS | Status: DC
  Administered 2017-12-05 – 2017-12-07 (×2): 3 mL via INTRAVENOUS

## 2017-12-05 MED ORDER — TRIAMCINOLONE ACETONIDE 40 MG/ML IJ SUSP
40.0000 mg | Freq: Once | INTRAMUSCULAR | Status: AC
  Administered 2017-12-05: 40 mg via INTRA_ARTICULAR
  Filled 2017-12-05: qty 1

## 2017-12-05 MED ORDER — IPRATROPIUM-ALBUTEROL 0.5-2.5 (3) MG/3ML IN SOLN: 3.0000 mL | RESPIRATORY_TRACT | Status: DC | PRN

## 2017-12-05 MED ORDER — POTASSIUM CHLORIDE CRYS ER 10 MEQ PO TBCR
10.0000 meq | EXTENDED_RELEASE_TABLET | Freq: Every day | ORAL | Status: DC
  Administered 2017-12-05 – 2017-12-08 (×4): 10 meq via ORAL
  Filled 2017-12-05 (×4): qty 1

## 2017-12-05 MED ORDER — SODIUM CHLORIDE 0.9% FLUSH
3.0000 mL | Freq: Two times a day (BID) | INTRAVENOUS | Status: DC
  Administered 2017-12-05 – 2017-12-08 (×7): 3 mL via INTRAVENOUS

## 2017-12-05 MED ORDER — FUROSEMIDE 20 MG PO TABS
20.0000 mg | ORAL_TABLET | Freq: Two times a day (BID) | ORAL | Status: DC
  Administered 2017-12-06 – 2017-12-08 (×5): 20 mg via ORAL
  Filled 2017-12-05 (×5): qty 1

## 2017-12-05 MED ORDER — IPRATROPIUM-ALBUTEROL 0.5-2.5 (3) MG/3ML IN SOLN
3.0000 mL | Freq: Four times a day (QID) | RESPIRATORY_TRACT | Status: DC
  Administered 2017-12-05 – 2017-12-08 (×11): 3 mL via RESPIRATORY_TRACT
  Filled 2017-12-05 (×12): qty 3

## 2017-12-05 MED ORDER — TRIAMCINOLONE ACETONIDE 40 MG/ML IJ SUSP
40.0000 mg | Freq: Once | INTRAMUSCULAR | Status: DC
  Filled 2017-12-05: qty 1

## 2017-12-05 MED ORDER — SODIUM CHLORIDE 0.9 % IV BOLUS
500.0000 mL | Freq: Once | INTRAVENOUS | Status: AC
  Administered 2017-12-05: 500 mL via INTRAVENOUS

## 2017-12-05 MED ORDER — RISPERIDONE 0.5 MG PO TABS
0.5000 mg | ORAL_TABLET | Freq: Every day | ORAL | Status: DC
  Administered 2017-12-05 – 2017-12-07 (×4): 0.5 mg via ORAL
  Filled 2017-12-05 (×5): qty 1

## 2017-12-05 MED ORDER — METHYLPREDNISOLONE SODIUM SUCC 125 MG IJ SOLR
60.0000 mg | INTRAMUSCULAR | Status: DC
  Administered 2017-12-05 – 2017-12-08 (×4): 60 mg via INTRAVENOUS
  Filled 2017-12-05 (×4): qty 2

## 2017-12-05 NOTE — Care Management Important Message (Signed)
Copy of signed Medicare IM left in patient's room with wife.  Explained IM with assistance of [place] [phone] - Interpreter: Marquette Saa, [place]: [phone]).

## 2017-12-05 NOTE — Plan of Care (Signed)
  Problem: Education: Goal: Knowledge of General Education information will improve Description Including pain rating scale, medication(s)/side effects and non-pharmacologic comfort measures Outcome: Progressing   Problem: Health Behavior/Discharge Planning: Goal: Ability to manage health-related needs will improve Outcome: Progressing   Problem: Clinical Measurements: Goal: Ability to maintain clinical measurements within normal limits will improve Outcome: Progressing Goal: Will remain free from infection Outcome: Progressing Goal: Diagnostic test results will improve Outcome: Progressing Goal: Respiratory complications will improve Outcome: Progressing Goal: Cardiovascular complication will be avoided Outcome: Progressing   Problem: Activity: Goal: Risk for activity intolerance will decrease Outcome: Progressing   Problem: Nutrition: Goal: Adequate nutrition will be maintained Outcome: Progressing   Problem: Coping: Goal: Level of anxiety will decrease Outcome: Progressing   Problem: Elimination: Goal: Will not experience complications related to bowel motility Outcome: Progressing Goal: Will not experience complications related to urinary retention Outcome: Progressing   Problem: Pain Managment: Goal: General experience of comfort will improve Outcome: Progressing   Problem: Safety: Goal: Ability to remain free from injury will improve Outcome: Progressing   Problem: Skin Integrity: Goal: Risk for impaired skin integrity will decrease Outcome: Progressing   Problem: Education: Goal: Knowledge of disease or condition will improve Outcome: Progressing Goal: Knowledge of secondary prevention will improve Outcome: Progressing Goal: Knowledge of patient specific risk factors addressed and post discharge goals established will improve Outcome: Progressing   Problem: Coping: Goal: Will identify appropriate support needs Outcome: Progressing   Problem:  Health Behavior/Discharge Planning: Goal: Ability to manage health-related needs will improve Outcome: Progressing   Problem: Self-Care: Goal: Ability to participate in self-care as condition permits will improve Outcome: Progressing Goal: Ability to communicate needs accurately will improve Outcome: Progressing   Problem: Nutrition: Goal: Risk of aspiration will decrease Outcome: Progressing   Problem: Ischemic Stroke/TIA Tissue Perfusion: Goal: Complications of ischemic stroke/TIA will be minimized Outcome: Progressing   Problem: Spiritual Needs Goal: Ability to function at adequate level Outcome: Progressing

## 2017-12-05 NOTE — Progress Notes (Signed)
PT Cancellation Note  Patient Details Name: GARVEY WESTCOTT MRN: [id number] DOB: 10/09/1940   Cancelled Treatment:    Reason Eval/Treat Not Completed: Patient at procedure or test/unavailable(Pt off floor for testing/procedure, PT will follow up this PM.)  [name] PT, DPT 11:12 AM,12/05/17 [phone]

## 2017-12-05 NOTE — Progress Notes (Signed)
Subjective: Patient weaker today per family.  They feel that his left hand is weaker.  It appears that he is more lethargic as well.    Objective: Current vital signs: BP 105/85 (BP Location: Left Arm)   Pulse 77   Temp 98 F (36.7 C) (Oral)   Resp 19   Ht 5\' 4"  (1.626 m)   Wt 87.2 kg   SpO2 95%   BMI 33.01 kg/m  Vital signs in last 24 hours: Temp:  [97.6 F (36.4 C)-98.3 F (36.8 C)] 98 F (36.7 C) (11/12 1611) Pulse Rate:  [68-85] 77 (11/12 1611) Resp:  [14-20] 19 (11/12 1611) BP: (76-128)/(59-88) 105/85 (11/12 1611) SpO2:  [91 %-98 %] 95 % (11/12 1611)  Intake/Output from previous day: 11/11 0701 - 11/12 0700 In: -  Out: 450 [Urine:450] Intake/Output this shift: Total I/O In: 544.2 [IV Piggyback:544.2] Out: 650 [Urine:650] Nutritional status:  Diet Order            Diet heart healthy/carb modified Room service appropriate? Yes; Fluid consistency: Thin  Diet effective now              Neurologic Exam: Mental Status: Lethargic.  Follows simple commands.   Cranial Nerves: II: Discs flat bilaterally; Visual fields grossly normal, pupils equal, round, reactive to light and accommodation III,IV, VI: ptosis not present, extra-ocular motions intact bilaterally V,VII: smile symmetric, facial light touch sensation normal bilaterally VIII: hearing normal bilaterally IX,X: gag reflex present XI: bilateral shoulder shrug XII: midline tongue extension Motor: Patient able to lift both arms against gravity and maintain.  Lifts right leg off the bed but has difficulty lifting the left leg off the bed.     Lab Results: Basic Metabolic Panel: Recent Labs  Lab 12/02/17 1214 12/03/17 0940 12/05/17 1153  NA 136 135 134*  K 3.6 3.6 3.5  CL 96* 97* 96*  CO2 30 29 28   GLUCOSE 242* 215* 234*  BUN 11 12 15   CREATININE 0.94 0.91 1.03  CALCIUM 9.3 8.9 8.8*    Liver Function Tests: No results for input(s): AST, ALT, ALKPHOS, BILITOT, PROT, ALBUMIN in the last 168  hours. No results for input(s): LIPASE, AMYLASE in the last 168 hours. Recent Labs  Lab 12/05/17 1153  AMMONIA 28    CBC: Recent Labs  Lab 12/02/17 1214 12/03/17 0940  WBC 7.7 7.6  HGB 16.1 15.1  HCT 48.6 45.1  MCV 88.7 88.3  PLT 200 185    Cardiac Enzymes: Recent Labs  Lab 12/02/17 1214  TROPONINI <0.03    Lipid Panel: Recent Labs  Lab 12/03/17 0940  CHOL 159  TRIG 84  HDL 32*  CHOLHDL 5.0  VLDL 17  LDLCALC 110*    CBG: Recent Labs  Lab 12/04/17 1620 12/04/17 2100 12/05/17 0903 12/05/17 1139 12/05/17 1626  GLUCAP 199* 203* 158* 207* 321*    Microbiology: Results for orders placed or performed during the hospital encounter of 02/21/17  C difficile quick scan w PCR reflex     Status: None   Collection Time: 02/21/17  8:02 AM  Result Value Ref Range Status   C Diff antigen NEGATIVE NEGATIVE Final   C Diff toxin NEGATIVE NEGATIVE Final   C Diff interpretation No C. difficile detected.  Final    Comment: Performed at [facility], [address] [place], [place] [address]  Gastrointestinal Panel by PCR , Stool     Status: None   Collection Time: 02/21/17  8:02 AM  Result Value Ref  Range Status   Campylobacter species NOT DETECTED NOT DETECTED Final   Plesimonas shigelloides NOT DETECTED NOT DETECTED Final   Salmonella species NOT DETECTED NOT DETECTED Final   Yersinia enterocolitica NOT DETECTED NOT DETECTED Final   Vibrio species NOT DETECTED NOT DETECTED Final   Vibrio cholerae NOT DETECTED NOT DETECTED Final   Enteroaggregative E coli (EAEC) NOT DETECTED NOT DETECTED Final   Enteropathogenic E coli (EPEC) NOT DETECTED NOT DETECTED Final   Enterotoxigenic E coli (ETEC) NOT DETECTED NOT DETECTED Final   Shiga like toxin producing E coli (STEC) NOT DETECTED NOT DETECTED Final   Shigella/Enteroinvasive E coli (EIEC) NOT DETECTED NOT DETECTED Final   Cryptosporidium NOT DETECTED NOT DETECTED Final   Cyclospora cayetanensis NOT DETECTED  NOT DETECTED Final   Entamoeba histolytica NOT DETECTED NOT DETECTED Final   Giardia lamblia NOT DETECTED NOT DETECTED Final   Adenovirus F40/41 NOT DETECTED NOT DETECTED Final   Astrovirus NOT DETECTED NOT DETECTED Final   Norovirus GI/GII NOT DETECTED NOT DETECTED Final   Rotavirus A NOT DETECTED NOT DETECTED Final   Sapovirus (I, II, IV, and V) NOT DETECTED NOT DETECTED Final    Comment: Performed at [facility], [address] [place], [place] [address]    Coagulation Studies: No results for input(s): LABPROT, INR in the last 72 hours.  Imaging: Dg Chest 2 View  Result Date: 12/05/2017 CLINICAL DATA:  Wheezing. EXAM: CHEST - 2 VIEW COMPARISON:  12/02/2017. FINDINGS: Mediastinum hilar structures normal. Cardiomegaly with normal pulmonary vascularity. No focal alveolar infiltrate. Low lung volumes. No pleural effusion or pneumothorax. Degenerative change thoracic spine. IMPRESSION: 1. Severe cardiomegaly. Normal pulmonary vascularity. No focal infiltrate noted. 2.  Mild basilar atelectasis. Electronically Signed   By: Marcello Moores  Register   On: 12/05/2017 11:10   Ct Head Wo Contrast  Result Date: 12/05/2017 CLINICAL DATA:  77 year old male with acute infarct posterior limb right internal capsule. Subsequent encounter. EXAM: CT HEAD WITHOUT CONTRAST TECHNIQUE: Contiguous axial images were obtained from the base of the skull through the vertex without intravenous contrast. COMPARISON:  12/02/2017 brain MR and head CT. 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 Chronic microvascular changes. No intracranial hemorrhage. 2.4 cm anterior left middle cranial fossa meningioma displaces adjacent brain without associated vasogenic edema. Vascular: Vascular calcifications. Skull: No skull fracture detected. Slight irregularity left greater wing of sphenoid adjacent to meningioma. Sinuses/Orbits: Remote right inferior orbital floor fracture. Mucosal  thickening right maxillary sinus. Other: Mastoid air cells and middle ear cavities are clear. IMPRESSION: 1. 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 2. Chronic microvascular changes. 3. 2.4 cm anterior left middle cranial fossa meningioma displaces adjacent brain without associated vasogenic edema. 4. Remote right inferior orbital floor fracture. 5. Mucosal thickening right maxillary sinus. Electronically Signed   By: Genia Del M.D.   On: 12/05/2017 08:26    Medications:  I have reviewed the patient's current medications. Scheduled: . apixaban  5 mg Oral BID  . atorvastatin  40 mg Oral q1800  . donepezil  5 mg Oral QHS  . DULoxetine  60 mg Oral Daily  . ferrous sulfate  325 mg Oral Daily  . finasteride  5 mg Oral Daily  . [START ON 12/06/2017] furosemide  20 mg Oral BID  . insulin aspart  0-9 Units Subcutaneous TID WC  . ipratropium-albuterol  3 mL Inhalation Q6H  . levothyroxine  112 mcg Oral QAC breakfast  . methylPREDNISolone (SOLU-MEDROL) injection  60 mg Intravenous  Q24H  . metoprolol tartrate  25 mg Oral Q8H  . mometasone-formoterol  2 puff Inhalation BID  . omega-3 acid ethyl esters  2 g Oral BID  . pantoprazole  40 mg Oral Daily  . potassium chloride  10 mEq Oral Daily  . risperiDONE  0.5 mg Oral QHS  . senna-docusate  1 tablet Oral BID  . sodium chloride flush  3 mL Intravenous Q12H  . sodium chloride flush  3 mL Intravenous Q12H  . triamcinolone acetonide  40 mg Intra-articular Once    Assessment/Plan: Patient with lethargy and left leg weakness that was not documented previously.  Head CT repeated this morning and shows no acute changes or evidence of hemorrhage.  Some fluctuation can be seen with lacunar infarcts but will rule out a recurrent event.    Recommendations: 1. MR brain without contrast 2. Continue Apixaban   LOS: 3 days   Alexis Goodell, MD Neurology [phone] 12/05/2017  5:49 PM

## 2017-12-05 NOTE — Progress Notes (Signed)
Physical Therapy Treatment Patient Details Name: Derek Shelton MRN: [id number] DOB: 1940-03-23 Today's Date: 12/05/2017    History of Present Illness 77 y.o. male with a known history of asthma, congestive heart failure, COPD, coronary artery disease, dementia, diabetes without complication, atrial fibrillation, hyperlipidemia, hypertension, hypothyroidism, sleep apnea-had episode of weakness on left side and confusion 3 days ago while out for shopping.  He was taken to [facility]-where after MRI and echocardiogram found negative for acute stroke, suspected to have this episode due to hypoglycemia and sent home.  A few days later (11/7) he again had L side weakness, confusion and 2 falls.  CT and MRI confirm new CVA.    PT Comments    Patient asleep at start of session, woken with tactile and verbal prompts from family, denies pain. Patient needed extended time to mobilize to EOB, minAx1. More difficulty with EOB balance, bilateral UE support, feet supported and CGA-minAx1 to maintain balance. Sit <> stand x2 this session, first attempt pt with significantly more difficulty, modAx1 and heavy use of RW to pull up. Unable to stand upright, much difficulty initiating/controlling LLE to start side stepping to chair, maxAx1 for descent to bed. Second attempt with improved quality, modAx1 with RW to sidestep to chair, modAx1 to sit. Pt encouraged to attempt standing marching performed 1 rep with RW and modAx1. Pt returned to sitting and did not want to continue with exercises. Overall, patient demonstrated decreased mobility, activity tolerance, balance and LLE strength compared to previous session. The patient would benefit from further skilled PT to address current limitations.     Follow Up Recommendations  SNF     Equipment Recommendations  Other (comment)(TBD)    Recommendations for Other Services       Precautions / Restrictions Precautions Precautions: Fall Precaution Comments:  pt and family declining bed alarm and NSG aware Restrictions Weight Bearing Restrictions: No    Mobility  Bed Mobility Overal bed mobility: Needs Assistance Bed Mobility: Supine to Sit     Supine to sit: Min assist     General bed mobility comments: Extended time needed, reaches for assistance  Transfers Overall transfer level: Needs assistance Equipment used: Rolling walker (2 wheeled) Transfers: Sit to/from Stand Sit to Stand: Mod assist         General transfer comment: x2 attempt, first attempt patient unable to initiate side stepping to chair, maxAx1 for controlled descent to bed.   Ambulation/Gait Ambulation/Gait assistance: Mod assist Gait Distance (Feet): 3 Feet Assistive device: Rolling walker (2 wheeled)       General Gait Details: Patient needed verbal cues for upright posture, more difficulty with intiating/controlling LLE movement compared to previous session.   Stairs             Wheelchair Mobility    Modified Rankin (Stroke Patients Only)       Balance Overall balance assessment: Needs assistance Sitting-balance support: Bilateral upper extremity supported;Feet supported Sitting balance-Leahy Scale: Poor Sitting balance - Comments: more difficulty maintaining balance sitting EOB     Standing balance-Leahy Scale: Poor                              Cognition Arousal/Alertness: Lethargic Behavior During Therapy: Flat affect Overall Cognitive Status: Difficult to assess Area of Impairment: Orientation;Problem solving                 Orientation Level: Disoriented to;Time;Situation  Problem Solving: Slow processing;Requires verbal cues;Requires tactile cues General Comments: Intepretor services present throughout services, but son translated.       Exercises Other Exercises Other Exercises: attempted standing marching, performed once on each side with RW and modAx1, patient did not want to continue  activity.    General Comments        Pertinent Vitals/Pain Pain Assessment: No/denies pain    Home Living                      Prior Function            PT Goals (current goals can now be found in the care plan section) Acute Rehab PT Goals Patient Stated Goal: get back to PLOF Progress towards PT goals: Not progressing toward goals - comment(More difficulty noted with mobility this session, pt unwilling to perform more exercises.)    Frequency    7X/week      PT Plan Current plan remains appropriate    Co-evaluation              AM-PAC PT "6 Clicks" Daily Activity  Outcome Measure  Difficulty turning over in bed (including adjusting bedclothes, sheets and blankets)?: A Lot Difficulty moving from lying on back to sitting on the side of the bed? : Unable Difficulty sitting down on and standing up from a chair with arms (e.g., wheelchair, bedside commode, etc,.)?: Unable Help needed moving to and from a bed to chair (including a wheelchair)?: A Lot Help needed walking in hospital room?: Total Help needed climbing 3-5 steps with a railing? : Total 6 Click Score: 8    End of Session Equipment Utilized During Treatment: Gait belt Activity Tolerance: Patient limited by fatigue;Patient limited by pain Patient left: with chair alarm set;in chair;with family/visitor present;with call bell/phone within reach Nurse Communication: Mobility status PT Visit Diagnosis: Muscle weakness (generalized) (M62.81);Difficulty in walking, not elsewhere classified (R26.2)     Time: 7616-0737 PT Time Calculation (min) (ACUTE ONLY): 19 min  Charges:  $Therapeutic Activity: 23-37 mins                     [name] PT, DPT 3:17 PM,12/05/17 [phone]

## 2017-12-05 NOTE — Consult Note (Signed)
ORTHOPAEDIC CONSULTATION  REQUESTING PHYSICIAN: Hillary Bow, MD  Chief Complaint: Bilateral knee pain left greater than right  HPI: Derek Shelton is a 77 y.o. male admitted for a CVA.  Patient has left-sided weakness.  Patient is on Eliquis for atrial fibrillation.  Patient has been recommended for physical therapy but his knee pain is making it difficult for him to participate.  Hospitalist services requested that I did bilateral corticosteroid injections to the patient in an effort to help alleviate his pain so he can participate with therapy.  Patient is seen in his hospital room.  His son is at the bedside.  Patient is lethargic but arousable.  Past Medical History:  Diagnosis Date  . Anemia   . Asthma   . CHF (congestive heart failure) ([place])   . COPD (chronic obstructive pulmonary disease) ([place])   . Coronary artery disease   . Dementia ([place])    Per son's report  . Diabetes mellitus without complication ([place])   . Dysrhythmia    Atrial Fibrillation  . GERD (gastroesophageal reflux disease)   . Hyperlipidemia   . Hypertension   . Hypothyroidism   . Shortness of breath dyspnea   . Sleep apnea   . Thyroid disease    Past Surgical History:  Procedure Laterality Date  . CARDIAC CATHETERIZATION    . COLONOSCOPY N/A 08/10/2014   Procedure: COLONOSCOPY;  Surgeon: Manya Silvas, MD;  Location: [facility] ENDOSCOPY;  Service: Endoscopy;  Laterality: N/A;  . COLONOSCOPY WITH PROPOFOL N/A 04/05/2016   Procedure: COLONOSCOPY WITH PROPOFOL;  Surgeon: Lollie Sails, MD;  Location: [facility] ENDOSCOPY;  Service: Endoscopy;  Laterality: N/A;  . ESOPHAGOGASTRODUODENOSCOPY N/A 08/08/2014   Procedure: ESOPHAGOGASTRODUODENOSCOPY (EGD);  Surgeon: Manya Silvas, MD;  Location: [facility] ENDOSCOPY;  Service: Endoscopy;  Laterality: N/A;  plan for early afternoon  . EYE SURGERY    . GIVENS CAPSULE STUDY N/A 08/12/2014   Procedure: GIVENS CAPSULE STUDY;  Surgeon: Manya Silvas, MD;  Location: [facility]  ENDOSCOPY;  Service: Endoscopy;  Laterality: N/A;  . rectal fistula N/A    Social History   Socioeconomic History  . Marital status: Married    Spouse name: Not on file  . Number of children: Not on file  . Years of education: Not on file  . Highest education level: Not on file  Occupational History  . Not on file  Social Needs  . Financial resource strain: Not on file  . Food insecurity:    Worry: Not on file    Inability: Not on file  . Transportation needs:    Medical: Not on file    Non-medical: Not on file  Tobacco Use  . Smoking status: Former Research scientist (life sciences)  . Smokeless tobacco: Current User    Types: Chew  Substance and Sexual Activity  . Alcohol use: No  . Drug use: No  . Sexual activity: Not on file  Lifestyle  . Physical activity:    Days per week: Not on file    Minutes per session: Not on file  . Stress: Not on file  Relationships  . Social connections:    Talks on phone: Patient refused    Gets together: Patient refused    Attends religious service: Patient refused    Active member of club or organization: Patient refused    Attends meetings of clubs or organizations: Patient refused    Relationship status: Patient refused  Other Topics Concern  . Not on file  Social History Narrative  .  Not on file   Family History  Problem Relation Age of Onset  . Diabetes Mother    Allergies  Allergen Reactions  . Catapres [Clonidine Hcl]   . Coreg [Carvedilol]   . Penicillins    Prior to Admission medications   Medication Sig Start Date End Date Taking? Authorizing Provider  apixaban (ELIQUIS) 5 MG TABS tablet Take 1 tablet (5 mg total) by mouth 2 (two) times daily. 05/23/17  Yes [name], MD  benzonatate (TESSALON) 100 MG capsule Take 100 mg by mouth 3 (three) times daily as needed for cough.   Yes [provider]  budesonide-formoterol (SYMBICORT) 160-4.5 MCG/ACT inhaler Inhale 2 puffs into the lungs 2 (two) times daily.   Yes [provider]  digoxin (LANOXIN) 0.25 MG tablet Take 1 tablet (0.25 mg total) by mouth daily. Patient taking differently: Take 0.125 mg by mouth daily.  05/23/17  Yes [name], MD  donepezil (ARICEPT) 5 MG tablet Take 5 mg by mouth at bedtime.   Yes [provider]  DULoxetine (CYMBALTA) 60 MG capsule Take 60 mg by mouth daily.   Yes [provider]  esomeprazole (NEXIUM) 40 MG capsule Take 40 mg by mouth daily at 12 noon.   Yes [provider]  ferrous sulfate 325 (65 FE) MG EC tablet Take 325 mg by mouth daily.   Yes [provider]  finasteride (PROSCAR) 5 MG tablet Take 1 tablet (5 mg total) by mouth daily. 05/29/17 05/29/18 Yes Festus Aloe, MD  furosemide (LASIX) 20 MG tablet Take 20 mg by mouth 2 (two) times daily.   Yes [provider]  glimepiride (AMARYL) 2 MG tablet Take 3 mg by mouth 2 (two) times daily.    Yes [provider]  guaiFENesin-dextromethorphan (ROBITUSSIN DM) 100-10 MG/5ML syrup Take 5 mLs by mouth every 4 (four) hours as needed for cough. 05/23/17  Yes [name], MD  insulin degludec (TRESIBA FLEXTOUCH) 100 UNIT/ML SOPN FlexTouch Pen Inject 15 Units into the skin at bedtime.   Yes [provider]  ipratropium-albuterol (DUONEB) 0.5-2.5 (3) MG/3ML SOLN Inhale 3 mLs into the lungs every 6 (six) hours as needed for shortness of breath. 01/22/17  Yes [provider]  levothyroxine (SYNTHROID, LEVOTHROID) 112 MCG tablet Take 112 mcg by mouth daily before breakfast.    Yes [provider]  Melatonin 3 MG TABS Take 3 mg by mouth at bedtime as needed (sleep).    Yes [provider]  metoprolol tartrate (LOPRESSOR) 100 MG tablet Take 1 tablet (100 mg total) by mouth 2 (two) times daily. 02/11/17  Yes Wieting, Richard, MD  montelukast (SINGULAIR) 10 MG tablet Take 10 mg by mouth daily.   Yes [provider]  omega-3 acid ethyl esters (LOVAZA) 1 G capsule Take 2 g by mouth 2 (two) times daily.     Yes [provider]  potassium chloride SA (K-DUR,KLOR-CON) 20 MEQ tablet Take 40 mEq by mouth See admin instructions. Take 2 tablets (40MEQ) by mouth twice daily for 3 days as directed 12/01/17  Yes [provider]  atorvastatin (LIPITOR) 40 MG tablet Take 1 tablet (40 mg total) by mouth daily at 6 PM. 12/03/17   Demetrios Loll, MD  glimepiride (AMARYL) 1 MG tablet Take 1 tablet (1 mg total) by mouth daily with breakfast. Patient not taking: Reported on 12/02/2017 02/11/17   [name], MD   Dg Chest 2 View  Result Date: 12/05/2017 CLINICAL DATA:  Wheezing. EXAM: CHEST -  2 VIEW COMPARISON:  12/02/2017. FINDINGS: Mediastinum hilar structures normal. Cardiomegaly with normal pulmonary vascularity. No focal alveolar infiltrate. Low lung volumes. No pleural effusion or pneumothorax. Degenerative change thoracic spine. IMPRESSION: 1. Severe cardiomegaly. Normal pulmonary vascularity. No focal infiltrate noted. 2.  Mild basilar atelectasis. Electronically Signed   By: Marcello Moores  Register   On: 12/05/2017 11:10   Ct Head Wo Contrast  Result Date: 12/05/2017 CLINICAL DATA:  77 year old male with acute infarct posterior limb right internal capsule. Subsequent encounter. EXAM: CT HEAD WITHOUT CONTRAST TECHNIQUE: Contiguous axial images were obtained from the base of the skull through the vertex without intravenous contrast. COMPARISON:  12/02/2017 brain MR and head CT. 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 Chronic microvascular changes. No intracranial hemorrhage. 2.4 cm anterior left middle cranial fossa meningioma displaces adjacent brain without associated vasogenic edema. Vascular: Vascular calcifications. Skull: No skull fracture detected. Slight irregularity left greater wing of sphenoid adjacent to meningioma. Sinuses/Orbits: Remote right inferior orbital floor fracture. Mucosal thickening right maxillary sinus. Other: Mastoid air cells and  middle ear cavities are clear. IMPRESSION: 1. 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 2. Chronic microvascular changes. 3. 2.4 cm anterior left middle cranial fossa meningioma displaces adjacent brain without associated vasogenic edema. 4. Remote right inferior orbital floor fracture. 5. Mucosal thickening right maxillary sinus. Electronically Signed   By: Genia Del M.D.   On: 12/05/2017 08:26    Positive ROS: All other systems have been reviewed and were otherwise negative with the exception of those mentioned in the HPI and as above.  Physical Exam: General: Lethargic but arousable, no acute distress  MUSCULOSKELETAL: Skin overlying both knees is intact.  Patient has no erythema ecchymosis or effusion.  Patient distally has palpable pedal pulses.  It is difficult to assess the patient's motor function, strength and sensation in his lethargy.  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  An x-ray series of the left knee taken 3 days ago shows advanced tricompartmental osteoarthritis with bone-on-bone contact in the medial compartment.  There is no medical contraindication to the patient receiving corticosteroid injections.  His last set of injections was last January at [facility]  Procedure note:  The patient was prepped with Betadine over the anterolateral aspect of both knees.  Under sterile conditions I injected 40 mg of Kenalog with 4 cc 1% lidocaine plain into each knee.  Patient tolerated the injections well.  His son was at the bedside during the injections.  Injection sites were then cleaned with alcohol and Band-Aids were applied.  From the patient's son that it may take 1 to 2 weeks to place the full benefit of the injections.  Given his severe osteoarthritis it is possible the patient does not respond to the injections.    Thornton Park,  MD    12/05/2017 4:36 PM

## 2017-12-05 NOTE — Progress Notes (Signed)
[place] [facility] [place] NAME: Derek Shelton    MR#:  [id number]  DATE OF BIRTH:  02/28/1940  SUBJECTIVE:  CHIEF COMPLAINT:   Chief Complaint  Patient presents with  . Fall  . Leg Pain   Left-sided weakness. Less alert today. Had wheezing earlier and had neb treatment. Wife at bedside REVIEW OF SYSTEMS:  Review of Systems  Unable to perform ROS: Mental status change    DRUG ALLERGIES:   Allergies  Allergen Reactions  . Catapres [Clonidine Hcl]   . Coreg [Carvedilol]   . Penicillins    VITALS:  Blood pressure 95/72, pulse 70, temperature 97.6 F (36.4 C), temperature source Oral, resp. rate 19, height 5\' 4"  (1.626 m), weight 87.2 kg, SpO2 93 %. PHYSICAL EXAMINATION:  Physical Exam  Constitutional: He is oriented to person, place, and time. No distress.  HENT:  Head: Normocephalic.  Mouth/Throat: Oropharynx is clear and moist.  Eyes: Pupils are equal, round, and reactive to light. Conjunctivae and EOM are normal. No scleral icterus.  Neck: Normal range of motion. Neck supple. No JVD present. No tracheal deviation present.  Cardiovascular: Normal rate, regular rhythm and normal heart sounds. Exam reveals no gallop.  No murmur heard. Pulmonary/Chest: Effort normal and breath sounds normal. No respiratory distress. He has no wheezes. He has no rales.  Abdominal: Soft. Bowel sounds are normal. He exhibits no distension. There is no tenderness. There is no rebound.  Musculoskeletal: Normal range of motion. He exhibits no edema or tenderness.  Neurological: He is alert and oriented to person, place, and time. No cranial nerve deficit.  LLE 4-/5 LUE 5-/5 Right normal  Skin: No rash noted. No erythema.  Psychiatric: He has a normal mood and affect.   LABORATORY PANEL:  Male CBC Recent Labs  Lab 12/03/17 0940  WBC 7.6  HGB 15.1  HCT 45.1  PLT 185    ------------------------------------------------------------------------------------------------------------------ Chemistries  Recent Labs  Lab 12/03/17 0940  NA 135  K 3.6  CL 97*  CO2 29  GLUCOSE 215*  BUN 12  CREATININE 0.91  CALCIUM 8.9   RADIOLOGY:  Dg Chest 2 View  Result Date: 12/05/2017 CLINICAL DATA:  Wheezing. EXAM: CHEST - 2 VIEW COMPARISON:  12/02/2017. FINDINGS: Mediastinum hilar structures normal. Cardiomegaly with normal pulmonary vascularity. No focal alveolar infiltrate. Low lung volumes. No pleural effusion or pneumothorax. Degenerative change thoracic spine. IMPRESSION: 1. Severe cardiomegaly. Normal pulmonary vascularity. No focal infiltrate noted. 2.  Mild basilar atelectasis. Electronically Signed   By: Marcello Moores  Register   On: 12/05/2017 11:10   Ct Head Wo Contrast  Result Date: 12/05/2017 CLINICAL DATA:  77 year old male with acute infarct posterior limb right internal capsule. Subsequent encounter. EXAM: CT HEAD WITHOUT CONTRAST TECHNIQUE: Contiguous axial images were obtained from the base of the skull through the vertex without intravenous contrast. COMPARISON:  12/02/2017 brain MR and head CT. 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 Chronic microvascular changes. No intracranial hemorrhage. 2.4 cm anterior left middle cranial fossa meningioma displaces adjacent brain without associated vasogenic edema. Vascular: Vascular calcifications. Skull: No skull fracture detected. Slight irregularity left greater wing of sphenoid adjacent to meningioma. Sinuses/Orbits: Remote right inferior orbital floor fracture. Mucosal thickening right maxillary sinus. Other: Mastoid air cells and middle ear cavities are clear. IMPRESSION: 1. 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 2. Chronic microvascular changes. 3. 2.4 cm anterior left middle cranial fossa meningioma displaces adjacent brain without associated  vasogenic edema. 4. Remote right inferior orbital floor fracture. 5. Mucosal thickening right maxillary sinus. Electronically Signed   By: Genia Del M.D.   On: 12/05/2017 08:26   ASSESSMENT AND PLAN:   * Acute right internal capsule CVA Carotid duplex is unremarkable. Patient had echocardiogram done 3 days prior to admission and did not show any thrombus Consistent with small vessel embolic stroke in setting of A-fib, continue Eliquis at the same dose. Generalized weakness all over today with patient being less alert. Repeat CT scan shows same stroke.  Repeat MRI on the brain is pending.  *COPD exacerbation Start IV steroids.  Nebulizers.  Inhalers.  * Paroxysmal atrial fibrillation with episodes of bradycardia Discussed with Dr. Yancey Flemings of cardiology.  Patient will need pacemaker.  Tachycardic today and will restart metoprolol.  Hold digoxin. Consulted Dr. Stephani Police of cardiology for pacemaker placement.  Dr. Yancey Flemings to discuss with him.  * Hyperlipidemia.  Lipitor.  * Hypertension Continue metoprolol but hold benazepril to avoid blood pressure slightly on the high side for perfusion.  * Diabetes Sliding scale coverage and continue home medications.  * Chronic diastolic heart failure Continue Lasix.  No exacerbation symptoms.  All the records are reviewed and case discussed with Care Management/Social Worker. Management plans discussed with the patient, his wife and they are in agreement.  CODE STATUS: Full Code  TOTAL TIME TAKING CARE OF THIS PATIENT: 35 minutes.   POSSIBLE D/C IN 1-2 DAYS, DEPENDING ON CLINICAL CONDITION.  Neita Carp M.D on 12/05/2017 at 12:22 PM  Between 7am to 6pm - Pager - [phone]  After 6pm go to [email or web address] - [facility]

## 2017-12-05 NOTE — Consult Note (Signed)
[facility] Cardiology  CARDIOLOGY CONSULT NOTE  Patient ID: Derek Shelton MRN: [id number] DOB/AGE: 79-Feb-1942 79 y.o.  Admit date: 12/02/2017 Referring Physician Lee Primary Physician Lamonte Sakai Primary Cardiologist Humphrey Rolls Reason for Consultation Sinus pauses, consideration for pacemaker  HPI: 77 year old gentleman referred for evaluation of sinus pauses and for consideration of pacemaker. The patient has a history of paroxysmal atrial fibrillation on Eliquis, hypertension, sleep apnea, chronic diastolic CHF, type II diabetes, and COPD. The patient speaks very little English, and the history was obtained from reviewing provider notes and interviewing the patient's wife at bedside who also speaks little [place]. The patient presented to [facility] ER on 12/02/17 for left sided weakness and subsequent falls. Three days prior, the patient has acute confusion, left sided weakness, and was evaluated at [facility] where brain MRI was negative for stroke, and his symptoms were felt to be secondary to hypoglycemia. 2D echocardiogram revealed normal left ventricular function with LVEF 50-60%. Brain Mri during this admission confirmed acute infarction in the posterior limb internal capsule consistent with small vessel embolic stroke in the setting of atrial fibrillation. Dr. Irish Elders recommended continuing Eliquis. The patient had episodes of bradycardia and pauses, with longest documented pause of 2.5 seconds. His digoxin and metoprolol were held, however the patient had episodes of atrial fibrillation with RVR, and his metoprolol was restarted. His rate currently is in the 70s. The patient reported acute recurrent left sided weakness this morning, and stat CT head was performed; results pending. The patient also had increased wheezing this morning, and received more frequent Duoneb. Due to the language barrier, a complete ROS could not be completed.      Past Medical History:  Diagnosis Date  . Anemia    . Asthma   . CHF (congestive heart failure) ([place])   . COPD (chronic obstructive pulmonary disease) ([place])   . Coronary artery disease   . Dementia ([place])    Per son's report  . Diabetes mellitus without complication ([place])   . Dysrhythmia    Atrial Fibrillation  . GERD (gastroesophageal reflux disease)   . Hyperlipidemia   . Hypertension   . Hypothyroidism   . Shortness of breath dyspnea   . Sleep apnea   . Thyroid disease     Past Surgical History:  Procedure Laterality Date  . CARDIAC CATHETERIZATION    . COLONOSCOPY N/A 08/10/2014   Procedure: COLONOSCOPY;  Surgeon: Manya Silvas, MD;  Location: [facility] ENDOSCOPY;  Service: Endoscopy;  Laterality: N/A;  . COLONOSCOPY WITH PROPOFOL N/A 04/05/2016   Procedure: COLONOSCOPY WITH PROPOFOL;  Surgeon: Lollie Sails, MD;  Location: [facility] ENDOSCOPY;  Service: Endoscopy;  Laterality: N/A;  . ESOPHAGOGASTRODUODENOSCOPY N/A 08/08/2014   Procedure: ESOPHAGOGASTRODUODENOSCOPY (EGD);  Surgeon: Manya Silvas, MD;  Location: [facility] ENDOSCOPY;  Service: Endoscopy;  Laterality: N/A;  plan for early afternoon  . EYE SURGERY    . GIVENS CAPSULE STUDY N/A 08/12/2014   Procedure: GIVENS CAPSULE STUDY;  Surgeon: Manya Silvas, MD;  Location: [facility] ENDOSCOPY;  Service: Endoscopy;  Laterality: N/A;  . rectal fistula N/A     Medications Prior to Admission  Medication Sig Dispense Refill Last Dose  . apixaban (ELIQUIS) 5 MG TABS tablet Take 1 tablet (5 mg total) by mouth 2 (two) times daily. 60 tablet 1 12/02/2017 at 0800  . benzonatate (TESSALON) 100 MG capsule Take 100 mg by mouth 3 (three) times daily as needed for cough.   Past Month at PRN  . budesonide-formoterol (SYMBICORT) 160-4.5 MCG/ACT  inhaler Inhale 2 puffs into the lungs 2 (two) times daily.   12/02/2017 at 0800  . digoxin (LANOXIN) 0.25 MG tablet Take 1 tablet (0.25 mg total) by mouth daily. (Patient taking differently: Take 0.125 mg by mouth daily. ) 30 tablet 0 12/02/2017 at 0800  .  donepezil (ARICEPT) 5 MG tablet Take 5 mg by mouth at bedtime.   12/01/2017 at 2000  . DULoxetine (CYMBALTA) 60 MG capsule Take 60 mg by mouth daily.   12/02/2017 at 0800  . esomeprazole (NEXIUM) 40 MG capsule Take 40 mg by mouth daily at 12 noon.   12/01/2017 at Unknown time  . ferrous sulfate 325 (65 FE) MG EC tablet Take 325 mg by mouth daily.   12/02/2017 at 0800  . finasteride (PROSCAR) 5 MG tablet Take 1 tablet (5 mg total) by mouth daily. 90 tablet 3 12/02/2017 at 0800  . furosemide (LASIX) 20 MG tablet Take 20 mg by mouth 2 (two) times daily.   12/02/2017 at 0800  . glimepiride (AMARYL) 2 MG tablet Take 3 mg by mouth 2 (two) times daily.    12/02/2017 at 0800  . guaiFENesin-dextromethorphan (ROBITUSSIN DM) 100-10 MG/5ML syrup Take 5 mLs by mouth every 4 (four) hours as needed for cough. 118 mL 0 Unknown at PRN  . insulin degludec (TRESIBA FLEXTOUCH) 100 UNIT/ML SOPN FlexTouch Pen Inject 15 Units into the skin at bedtime.   12/01/2017 at 2000  . ipratropium-albuterol (DUONEB) 0.5-2.5 (3) MG/3ML SOLN Inhale 3 mLs into the lungs every 6 (six) hours as needed for shortness of breath.  3 Unknown at PRN  . levothyroxine (SYNTHROID, LEVOTHROID) 112 MCG tablet Take 112 mcg by mouth daily before breakfast.    12/02/2017 at 0700  . Melatonin 3 MG TABS Take 3 mg by mouth at bedtime as needed (sleep).    Unknown at PRN  . metoprolol tartrate (LOPRESSOR) 100 MG tablet Take 1 tablet (100 mg total) by mouth 2 (two) times daily. 60 tablet 0 12/02/2017 at 0800  . montelukast (SINGULAIR) 10 MG tablet Take 10 mg by mouth daily.   12/02/2017 at 0800  . omega-3 acid ethyl esters (LOVAZA) 1 G capsule Take 2 g by mouth 2 (two) times daily.    12/02/2017 at 0800  . potassium chloride SA (K-DUR,KLOR-CON) 20 MEQ tablet Take 40 mEq by mouth See admin instructions. Take 2 tablets (40MEQ) by mouth twice daily for 3 days as directed   12/02/2017 at 0800  . glimepiride (AMARYL) 1 MG tablet Take 1 tablet (1 mg total) by mouth daily with  breakfast. (Patient not taking: Reported on 12/02/2017) 30 tablet 0 Not Taking at Unknown time   Social History   Socioeconomic History  . Marital status: Married    Spouse name: Not on file  . Number of children: Not on file  . Years of education: Not on file  . Highest education level: Not on file  Occupational History  . Not on file  Social Needs  . Financial resource strain: Not on file  . Food insecurity:    Worry: Not on file    Inability: Not on file  . Transportation needs:    Medical: Not on file    Non-medical: Not on file  Tobacco Use  . Smoking status: Former Research scientist (life sciences)  . Smokeless tobacco: Current User    Types: Chew  Substance and Sexual Activity  . Alcohol use: No  . Drug use: No  . Sexual activity: Not on file  Lifestyle  .  Physical activity:    Days per week: Not on file    Minutes per session: Not on file  . Stress: Not on file  Relationships  . Social connections:    Talks on phone: Patient refused    Gets together: Patient refused    Attends religious service: Patient refused    Active member of club or organization: Patient refused    Attends meetings of clubs or organizations: Patient refused    Relationship status: Patient refused  . Intimate partner violence:    Fear of current or ex partner: Patient refused    Emotionally abused: Patient refused    Physically abused: Patient refused    Forced sexual activity: Patient refused  Other Topics Concern  . Not on file  Social History Narrative  . Not on file    Family History  Problem Relation Age of Onset  . Diabetes Mother            PHYSICAL EXAM  General: Well developed, well nourished, ill-appearing, lying in bed in no acute distress HEENT:  Normocephalic and atramatic Neck:  No JVD.  Lungs: Normal effort of breathing, wheezing Heart: Irregularly irregular. Normal S1 and S2 without gallops or murmurs.  Abdomen: nondistended Msk:  No obvious deformity Extremities: No clubbing,  cyanosis or edema.   Neuro: Alert and oriented X 3. Left upper and lower extremity with decreased strength as compared to right. Psych:  Flat affect, responds appropriately  Labs:   Lab Results  Component Value Date   WBC 7.6 12/03/2017   HGB 15.1 12/03/2017   HCT 45.1 12/03/2017   MCV 88.3 12/03/2017   PLT 185 12/03/2017    Recent Labs  Lab 12/03/17 0940  NA 135  K 3.6  CL 97*  CO2 29  BUN 12  CREATININE 0.91  CALCIUM 8.9  GLUCOSE 215*   Lab Results  Component Value Date   CKTOTAL 645 (H) 05/06/2014   CKMB 11.9 (H) 05/07/2014   TROPONINI <0.03 12/02/2017    Lab Results  Component Value Date   CHOL 159 12/03/2017   Lab Results  Component Value Date   HDL 32 (L) 12/03/2017   Lab Results  Component Value Date   LDLCALC 110 (H) 12/03/2017   Lab Results  Component Value Date   TRIG 84 12/03/2017   Lab Results  Component Value Date   CHOLHDL 5.0 12/03/2017   No results found for: LDLDIRECT    Radiology: Dg Chest 2 View  Result Date: 12/02/2017 CLINICAL DATA:  Acute ischemic stroke. EXAM: CHEST - 2 VIEW COMPARISON:  Radiographs of May 22, 2017. FINDINGS: Stable cardiomegaly. No pneumothorax or pleural effusion is noted. No acute pulmonary disease is noted. Bony thorax is unremarkable. IMPRESSION: No active cardiopulmonary disease. Electronically Signed   By: Marijo Conception, M.D.   On: 12/02/2017 19:38   Dg Pelvis 1-2 Views  Result Date: 12/02/2017 CLINICAL DATA:  Patient fell twice.  Concern for pelvis injury. EXAM: PELVIS - 1-2 VIEW COMPARISON:  None. FINDINGS: There is no evidence of pelvic fracture or diastasis. No pelvic bone lesions are seen. IMPRESSION: Negative. Electronically Signed   By: Staci Righter M.D.   On: 12/02/2017 13:31   Ct Head Wo Contrast  Result Date: 12/02/2017 CLINICAL DATA:  Fall twice yesterday.  Struck head, anticoagulation. EXAM: CT HEAD WITHOUT CONTRAST TECHNIQUE: Contiguous axial images were obtained from the base of the skull  through the vertex without intravenous contrast. COMPARISON:  09/15/2017 FINDINGS: Brain: Stable mass  anteriorly in the left middle cranial fossa favoring meningioma. Mildly accentuated hypodensity in the posterior limb of the right internal capsule measuring 1.0 by 0.7 cm on images 12-13 of series 2, subacute infarct in this vicinity not excluded. Periventricular white matter and corona radiata hypodensities favor chronic ischemic microvascular white matter disease. Otherwise, the brainstem, cerebellum, cerebral peduncles, thalami, basal ganglia, and basilar cisterns appear unremarkable. No intracranial hemorrhage or acute CVA. Vascular: There is atherosclerotic calcification of the cavernous carotid arteries bilaterally. Skull: Slight irregularity of the greater wing of the sphenoid in the vicinity of the anteromedial cranial fossa mass compatible with reactive findings from the mass. Sinuses/Orbits: Suspected old right inferior orbital wall fracture. Otherwise unremarkable. Other: No supplemental non-categorized findings. IMPRESSION: 1. Mildly greater asymmetric focal hypodensity focally in the posterior limb of the right internal capsule compared to previous exam from 09/15/2017, subacute lacunar infarct is not excluded. This could be further assessed with MRI if clinically warranted. 2. No intracranial hemorrhage is observed. 3. Mass anteriorly in the middle cranial fossa appears unchanged from previous exams and is probably a meningioma. 4. Old healed right inferior orbital wall fracture. 5. Atherosclerosis. 6. Periventricular white matter and corona radiata hypodensities favor chronic ischemic microvascular white matter disease. Electronically Signed   By: Van Clines M.D.   On: 12/02/2017 13:07   Mr Brain Wo Contrast  Result Date: 12/02/2017 CLINICAL DATA:  Possible stroke on CT. Fell twice yesterday. Recent syncopal episode. EXAM: MRI HEAD WITHOUT CONTRAST TECHNIQUE: Multiplanar, multiecho pulse  sequences of the brain and surrounding structures were obtained without intravenous contrast. COMPARISON:  CT same day.  MRI 05/26/2017. FINDINGS: Brain: Diffusion imaging confirms that there is an acute infarction in the posterior limb internal capsule on the right as was suggested by the CT. No other acute finding. Elsewhere, there is generalized atrophy and chronic small-vessel ischemic change of the hemispheric white matter. There is an old small left parietal cortical and subcortical stroke. No large vessel territory infarction. No intra-axial mass lesion, hemorrhage, hydrocephalus or extra-axial collection. 2.4 cm meningioma redemonstrated at the anterior middle cranial fossa on the left. Vascular: Major vessels at the base of the brain show flow. Skull and upper cervical spine: Negative Sinuses/Orbits: Mild inflammatory changes of the right maxillary sinus. Orbits negative. Other: None IMPRESSION: MRI confirms acute infarction in the posterior limb internal capsule on the right as was suggested by CT. Chronic small-vessel ischemic changes elsewhere affecting the brain of a chronic nature. 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 Electronically Signed   By: Nelson Chimes M.D.   On: 12/02/2017 14:38   US Carotid Bilateral (at [facility] Only)  Result Date: 12/03/2017 CLINICAL DATA:  77 year old male with acute right posterior limb internal capsule infarct EXAM: BILATERAL CAROTID DUPLEX ULTRASOUND TECHNIQUE: Pearline Cables scale imaging, color Doppler and duplex ultrasound were performed of bilateral carotid and vertebral arteries in the neck. COMPARISON:  None. FINDINGS: Criteria: Quantification of carotid stenosis is based on velocity parameters that correlate the residual internal carotid diameter with NASCET-based stenosis levels, using the diameter of the distal internal carotid lumen as the denominator for stenosis measurement. The following velocity  measurements were obtained: RIGHT ICA: 44/9 cm/sec CCA: 22/0 cm/sec SYSTOLIC ICA/CCA RATIO:  0.7 ECA:  68 cm/sec LEFT ICA: 53/15 cm/sec CCA: 25/42 cm/sec SYSTOLIC ICA/CCA RATIO:  1.1 ECA:  57 cm/sec RIGHT CAROTID ARTERY: No significant atherosclerotic plaque or evidence of stenosis. Of note, the carotid  bifurcation is high. RIGHT VERTEBRAL ARTERY:  Patent with normal antegrade flow. LEFT CAROTID ARTERY: No significant atherosclerotic plaque or evidence of stenosis. The carotid bifurcation is somewhat high. LEFT VERTEBRAL ARTERY:  Patent with normal antegrade flow. IMPRESSION: 1. No evidence of significant stenosis or atherosclerotic plaque in either internal carotid artery. 2. Vertebral arteries are patent with normal antegrade flow. Signed, Criselda Peaches, MD, [facility] Radiology Electronically Signed   By: Jacqulynn Cadet M.D.   On: 12/03/2017 08:16   Dg Knee Complete 4 Views Left  Result Date: 12/02/2017 CLINICAL DATA:  Fall.  Pain. EXAM: LEFT KNEE - COMPLETE 4+ VIEW COMPARISON:  None. FINDINGS: Severe degenerative change, worst in the medial compartment. Moderate medial translation of the femur on the tibia. Advanced patellofemoral DJD. No fracture or dislocation. No effusion. IMPRESSION: Severe DJD.  No fracture, dislocation, or effusion. Electronically Signed   By: Staci Righter M.D.   On: 12/02/2017 13:33    EKG: Atrial fibrillation, 75 bpm  ASSESSMENT AND PLAN:  77 year old gentleman referred for evaluation for permanent pacemaker implantation in the setting of tachybrady syndrome with atrial fibrillation. Metoprolol and digoxin were initially held.The patient was admitted 12/02/17 for evaluation of left sided weakness and falls, noted to have had an acute embolic stroke in the setting of atrial fibrillation. He reportedly regained his strength yesterday. The patient's Eliquis was held 12/04/17 in anticipation of pacemaker implantation,  but this morning the patient reported recurrent acute left sided weakness; head CT scan this morning pending. The patient was restarted on metoprolol for atrial fibrillation with RVR. His current rate is in the 70s. The patient is ill-appearing, reporting left sided weakness.  1. 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 Atrial fibrillation with RVR, rate improved after restarting metoprolol. Rate in the 70s this morning. Eliquis held 12/04/17 in anticipation for pacemaker. 3. Acute stroke per brain MRI, with recurrent left sided weakness this morning; head CT pending 4. Hypertension, hypotensive this morning 5. Chronic diastolic CHF 6. COPD, wheezing more this morning, receiving frequent Duonebs  Recommendations: 1. Pending results of CT head, consider restarting Eliquis in light of recent neurological events 2. Defer permanent pacemaker implantation at this time, as patient's heart rate is in the 70s, and patient is having recurrent left sided weakness   Signed: Clabe Seal PA-C 12/05/2017, 8:13 AM  This plan was made in collaboration with Dr. Saralyn Pilar who also evaluated the patient, and spoke with the patient's son at the bedside.

## 2017-12-05 NOTE — Progress Notes (Signed)
Family, wife and son, refuse bed alarm.

## 2017-12-05 NOTE — Clinical Social Work Note (Signed)
CSW was contacted by patient's son Derek Shelton. Son states that family has discussed patient and they have decided that short term rehab is the best option for patient. CSW gave bed offers to son and he chose [facility]. CSW notified Claiborne Billings at [facility] and she will begin [facility]. CSW will continue to follow for discharge planning.   [place], [place]

## 2017-12-05 NOTE — Progress Notes (Signed)
Occupational Therapy Treatment Patient Details Name: Derek Shelton MRN: [id number] DOB: [birth year] Today's Date: 12/05/2017    History of present illness 77 y.o. male with a known history of asthma, congestive heart failure, COPD, coronary artery disease, dementia, diabetes without complication, atrial fibrillation, hyperlipidemia, hypertension, hypothyroidism, sleep apnea-had episode of weakness on left side and confusion 3 days ago while out for shopping.  He was taken [facility]-where after MRI and echocardiogram found negative for acute stroke, suspected to have this episode due to hypoglycemia and sent home.  A few days later (11/7) he again had L side weakness, confusion and 2 falls.  CT and MRI confirm new CVA.   OT comments  Pt falling asleep but with alert with cues and able to wash face after set up and follow simple commands for LUE and hand exercises.  His wife is asking a lot of questions about food restrictions that they have for religious reasons and suggested they discuss with SW and dietician when they arrive to SNF.  Demonstrated with wife and pt how to have pt move himself up in bed and how to brace his LLE so he does most of movement vs his wife trying to pull him up and potentially hurt her back.  Used interprtetor on a stick, Naheed [id number] for entire session.  Follow Up Recommendations  SNF    Equipment Recommendations  3 in 1 bedside commode    Recommendations for Other Services      Precautions / Restrictions Precautions Precautions: Fall Precaution Comments: pt and family declining bed alarm and NSG aware Restrictions Weight Bearing Restrictions: No       Mobility Bed Mobility                  Transfers                      Balance                                           ADL either performed or assessed with clinical judgement   ADL Overall ADL's : Needs assistance/impaired                                       General ADL Comments: Pt falling asleep but with alert with cues and able to wash face after set up and follow simple commands for LUE and hand exercises.  His wife is asking a lot of questions about food restrictions that they have for religious reasons and suggested they discuss with SW and dietician when they arrive to SNF.  Demonstrated with wife and pt how to have pt move himself up in bed and how to brace his LLE so he does most of movement vs his wife trying to pull him up and potentially hurt her back.  Used interprtetor on a stick, Naheed [id number] for entire session.      Vision       Perception     Praxis      Cognition Arousal/Alertness: Lethargic Behavior During Therapy: Flat affect Overall Cognitive Status: Difficult to assess Area of Impairment: Orientation;Problem solving                 Orientation Level: Disoriented to;Time;Situation  Problem Solving: Slow processing;Requires verbal cues;Requires tactile cues General Comments: Intepretor utilized throughout session        Exercises     Shoulder Instructions       General Comments      Pertinent Vitals/ Pain       Pain Assessment: No/denies pain  Home Living                                          Prior Functioning/Environment              Frequency  Min 2X/week        Progress Toward Goals  OT Goals(current goals can now be found in the care plan section)  Progress towards OT goals: Progressing toward goals  Acute Rehab OT Goals Patient Stated Goal: get back to PLOF OT Goal Formulation: With patient/family Time For Goal Achievement: 12/18/17 Potential to Achieve Goals: Good  Plan Discharge plan remains appropriate    Co-evaluation                 AM-PAC PT "6 Clicks" Daily Activity     Outcome Measure   Help from another person eating meals?: None Help from another person taking care of personal grooming?:  None Help from another person toileting, which includes using toliet, bedpan, or urinal?: A Little Help from another person bathing (including washing, rinsing, drying)?: A Little Help from another person to put on and taking off regular upper body clothing?: A Little Help from another person to put on and taking off regular lower body clothing?: A Little 6 Click Score: 20    End of Session    OT Visit Diagnosis: Other symptoms and signs involving cognitive function;Hemiplegia and hemiparesis Hemiplegia - Right/Left: Left Hemiplegia - dominant/non-dominant: Non-Dominant Hemiplegia - caused by: Cerebral infarction   Activity Tolerance Patient tolerated treatment well   Patient Left in bed;with call bell/phone within reach;with family/visitor present   Nurse Communication          Time: 1130-1157 OT Time Calculation (min): 27 min  Charges: OT General Charges $OT Visit: 1 Visit OT Treatments $Self Care/Home Management : 8-22 mins $Therapeutic Exercise: 8-22 mins  Chrys Racer, OTR/L ascom [phone] 12/05/17, 12:08 PM

## 2017-12-05 NOTE — Progress Notes (Signed)
SUBJECTIVE: Patient is lethargic and left-sided weakness is more pronounced today   Vitals:   12-25-17 0324 12/25/2017 0647 25-Dec-2017 0716 12/25/2017 0902  BP: 112/61 (!) 83/59 (!) 76/61 95/72  Pulse: 71 85 79 70  Resp: 20  19   Temp: 98.3 F (36.8 C)  97.6 F (36.4 C)   TempSrc: Oral  Oral   SpO2: 97% 96% 93%   Weight:      Height:        Intake/Output Summary (Last 24 hours) at 25-Dec-2017 1130 Last data filed at 12/04/2017 2256 Gross per 24 hour  Intake -  Output 450 ml  Net -450 ml    LABS: Basic Metabolic Panel: Recent Labs    12/02/17 1214 12/03/17 0940  NA 136 135  K 3.6 3.6  CL 96* 97*  CO2 30 29  GLUCOSE 242* 215*  BUN 11 12  CREATININE 0.94 0.91  CALCIUM 9.3 8.9   Liver Function Tests: No results for input(s): AST, ALT, ALKPHOS, BILITOT, PROT, ALBUMIN in the last 72 hours. No results for input(s): LIPASE, AMYLASE in the last 72 hours. CBC: Recent Labs    12/02/17 1214 12/03/17 0940  WBC 7.7 7.6  HGB 16.1 15.1  HCT 48.6 45.1  MCV 88.7 88.3  PLT 200 185   Cardiac Enzymes: Recent Labs    12/02/17 1214  TROPONINI <0.03   BNP: Invalid input(s): POCBNP D-Dimer: No results for input(s): DDIMER in the last 72 hours. Hemoglobin A1C: Recent Labs    12/02/17 1214  HGBA1C 8.5*   Fasting Lipid Panel: Recent Labs    12/03/17 0940  CHOL 159  HDL 32*  LDLCALC 110*  TRIG 84  CHOLHDL 5.0   Thyroid Function Tests: No results for input(s): TSH, T4TOTAL, T3FREE, THYROIDAB in the last 72 hours.  Invalid input(s): FREET3 Anemia Panel: No results for input(s): VITAMINB12, FOLATE, FERRITIN, TIBC, IRON, RETICCTPCT in the last 72 hours.   PHYSICAL EXAM General: Well developed, well nourished, in no acute distress HEENT:  Normocephalic and atramatic Neck:  No JVD.  Lungs: Clear bilaterally to auscultation and percussion. Heart: HRRR . Normal S1 and S2 without gallops or murmurs.  Abdomen: Bowel sounds are positive, abdomen soft and non-tender   Msk:  Back normal, normal gait. Normal strength and tone for age. Extremities: No clubbing, cyanosis or edema.   Neuro: Alert and oriented X 3. Psych:  Good affect, responds appropriately  TELEMETRY: A. 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  There is left-sided weakness which is much more pronounced today.  Yesterday had 4/5 strength on the left side but today is severe weakness.  Patient appears to be lethargic.  CT scan reveals acute stroke.  Discussed with Dr. Donette Larry about permanent pacemaker.  Given the patient had a stroke and ventricular rate is around 90 and and is remaining reasonable rate.  Advise no pacemaker at this time.  Advise conservative treatment.  Discussed for outward while with the patient that he has 5 out of 5 risk factor factors for chads score and that is why he had second stroke again.  Unfortunate but right now physical therapy and Occupational Therapy is what is needed.  Principal Problem:   Acute CVA (cerebrovascular accident) [facility]) Active Problems:   Acute ischemic stroke ([place])    Neoma Laming A, MD, [facility] 12/25/2017 11:30 AM    F2

## 2017-12-05 NOTE — Progress Notes (Signed)
Patient c/o increased L sided weakness, NIH at 4. BP 83/59. Dr. Jodell Cipro notified and ordered a STAT head CT and 500 cc bolus. Also notified that bil wheezing increased from earlier assessment and duoneb given. Orders changed to duoneb q4h PRN. Patient alert, wife at bedside and updated on plan of care. Derek Shelton

## 2017-12-06 ENCOUNTER — Inpatient Hospital Stay: Payer: Medicare Other

## 2017-12-06 LAB — GLUCOSE, CAPILLARY
GLUCOSE-CAPILLARY: 275 mg/dL — AB (ref 70–99)
GLUCOSE-CAPILLARY: 387 mg/dL — AB (ref 70–99)
Glucose-Capillary: 332 mg/dL — ABNORMAL HIGH (ref 70–99)
Glucose-Capillary: 267 mg/dL — ABNORMAL HIGH (ref 70–99)

## 2017-12-06 MED ORDER — GLIMEPIRIDE 1 MG PO TABS
1.0000 mg | ORAL_TABLET | Freq: Two times a day (BID) | ORAL | Status: DC
  Administered 2017-12-06 – 2017-12-08 (×5): 1 mg via ORAL
  Filled 2017-12-06 (×6): qty 1

## 2017-12-06 MED ORDER — METOPROLOL TARTRATE 50 MG PO TABS
50.0000 mg | ORAL_TABLET | Freq: Three times a day (TID) | ORAL | Status: DC
  Administered 2017-12-06 – 2017-12-07 (×3): 50 mg via ORAL
  Filled 2017-12-06 (×3): qty 1

## 2017-12-06 NOTE — Progress Notes (Addendum)
Patient's heart rate elevated in the 120s-140s while at rest. Patient resting in bed with no complaints. Scheduled medication for heart rate given. Medication was not given earlier because patient was in MRI so it was given when patient returned. Will continue to monitor patient.   Update: 1611 Dr. Darvin Neighbours notified, Dr. Humphrey Rolls paged to notify.   Update: FNP order to continue and monitor patient's heart rate for right now.

## 2017-12-06 NOTE — Progress Notes (Signed)
Inpatient Diabetes Program Recommendations  AACE/ADA: New Consensus Statement on Inpatient Glycemic Control (2019)  Target Ranges:  Prepandial:   less than 140 mg/dL      Peak postprandial:   less than 180 mg/dL (1-2 hours)      Critically ill patients:  140 - 180 mg/dL   Results for Derek Shelton, Derek Shelton (MRN [id number]) as of 12/06/2017 10:23  Ref. Range 12/05/2017 09:03 12/05/2017 11:39 12/05/2017 16:26 12/05/2017 21:20 12/05/2017 22:56 12/06/2017 08:15  Glucose-Capillary Latest Ref Range: 70 - 99 mg/dL 158 (H) 207 (H) 321 (H) 281 (H) 308 (H) 275 (H)   Review of Glycemic Control  Diabetes history: DM2 Outpatient Diabetes medications: Tresiba 15 units QHS, Amaryl 3 mg BID Current orders for Inpatient glycemic control: Novolog 0-9 units TID with meals; Solumedrol 60 mg Q24H  Inpatient Diabetes Program Recommendations:  Insulin - Basal: If steroids are continued, please consider ordering Lantus 10 units Q24H starting now. Insulin-Correction: Please consider ordering Novolog 0-5 units QHS for bedtime correction. Insulin - Meal Coverage: If steroids are continued and patient is eating well, please consider ordering Novolog 3 units TID with meals for meal coverage if patient eats at least 50% of meals.  NOTE: Noted patient is now ordered Solumedrol 60 mg Q24H and received one time Kenalog 40 mg on 12/05/17 which is contributing to hyperglycemia.   Thanks, Derek Alderman, RN, MSN, CDE Diabetes Coordinator Inpatient Diabetes Program [phone] (Team Pager from 8am to 5pm)

## 2017-12-06 NOTE — Progress Notes (Signed)
   [place] [facility] [place] NAME: Derek Shelton    MR#:  [id number]  DATE OF BIRTH:  1940-08-25  SUBJECTIVE:  CHIEF COMPLAINT:   Chief Complaint  Patient presents with  . Fall  . Leg Pain   Left-sided weakness persists. Sitting in a chair Afib with RVR  REVIEW OF SYSTEMS:  Review of Systems  Unable to perform ROS: Mental status change   DRUG ALLERGIES:   Allergies  Allergen Reactions  . Catapres [Clonidine Hcl]   . Coreg [Carvedilol]   . Penicillins    VITALS:  Blood pressure 106/82, pulse (!) 123, temperature (!) 97.5 F (36.4 C), temperature source Oral, resp. rate (!) 25, height 5\' 4"  (1.626 m), weight 87.2 kg, SpO2 100 %. PHYSICAL EXAMINATION:  Physical Exam  Constitutional: No distress.  HENT:  Head: Normocephalic.  Mouth/Throat: Oropharynx is clear and moist.  Eyes: Pupils are equal, round, and reactive to light. Conjunctivae and EOM are normal. No scleral icterus.  Neck: Normal range of motion. Neck supple. No JVD present. No tracheal deviation present.  Cardiovascular: Normal rate, regular rhythm and normal heart sounds. Exam reveals no gallop.  No murmur heard. Pulmonary/Chest: Effort normal and breath sounds normal. No respiratory distress. He has no wheezes. He has no rales.  Abdominal: Soft. Bowel sounds are normal. He exhibits no distension. There is no tenderness. There is no rebound.  Musculoskeletal: Normal range of motion. He exhibits no edema or tenderness.  Neurological: He is alert. No cranial nerve deficit.  LLE 4-/5 LUE 5-/5 Right normal  Skin: No rash noted. No erythema.  Psychiatric: He has a normal mood and affect.   LABORATORY PANEL:  Male CBC Recent Labs  Lab 12/03/17 0940  WBC 7.6  HGB 15.1  HCT 45.1  PLT 185   ------------------------------------------------------------------------------------------------------------------ Chemistries  Recent Labs  Lab 12/05/17 1153  NA 134*  K  3.5  CL 96*  CO2 28  GLUCOSE 234*  BUN 15  CREATININE 1.03  CALCIUM 8.8*   RADIOLOGY:  No results found. ASSESSMENT AND PLAN:   * Acute right internal capsule CVA Carotid duplex is unremarkable. Patient had echocardiogram done 3 days prior to admission and did not show any thrombus Consistent with small vessel embolic stroke in setting of A-fib, continue Eliquis at the same dose. Generalized weakness all over today with patient being less alert. Repeat CT scan shows same stroke.  Repeat MRI on the brain is pending.  *COPD exacerbation ON steroids and nebs  * Paroxysmal atrial fibrillation with episodes of bradycardia No pacemaker needed. Stopped digoxin. Increase metoprolol Eliquis  * Hyperlipidemia.  Lipitor.  * Hypertension Continue metoprolol but hold benazepril to avoid blood pressure slightly on the high side for perfusion.  * Diabetes Sliding scale coverage and continue home medications.  * Chronic diastolic heart failure Continue Lasix.  No exacerbation symptoms.  All the records are reviewed and case discussed with Care Management/Social Worker. Management plans discussed with the patient, his wife and they are in agreement.  CODE STATUS: Full Code  TOTAL TIME TAKING CARE OF THIS PATIENT: 35 minutes.   POSSIBLE D/C IN 1-2 DAYS, DEPENDING ON CLINICAL CONDITION.  Neita Carp M.D on 12/06/2017 at 12:18 PM  Between 7am to 6pm - Pager - [phone]  After 6pm go to [email or web address] - [facility]

## 2017-12-06 NOTE — Progress Notes (Addendum)
Subjective: Patient up in the chair with family at bedside.  Per patient's son and wife, patient is  able to move his left side much better today. He is left handed. He is still weak however was able to get up and ambulate with physical therapy.    Objective: Current vital signs: BP 106/82   Pulse (!) 123   Temp (!) 97.5 F (36.4 C) (Oral)   Resp (!) 25   Ht 5\' 4"  (1.626 m)   Wt 87.2 kg   SpO2 100%   BMI 33.01 kg/m  Vital signs in last 24 hours: Temp:  [97.5 F (36.4 C)-98 F (36.7 C)] 97.5 F (36.4 C) (11/13 0516) Pulse Rate:  [61-156] 123 (11/13 0859) Resp:  [14-25] 25 (11/13 0819) BP: (90-137)/(70-96) 106/82 (11/13 0859) SpO2:  [94 %-100 %] 100 % (11/13 0819)  Intake/Output from previous day: 11/12 0701 - 11/13 0700 In: 544.2 [IV Piggyback:544.2] Out: 1300 [Urine:1300] Intake/Output this shift: Total I/O In: 3 [I.V.:3] Out: -  Nutritional status:  Diet Order            Diet heart healthy/carb modified Room service appropriate? Yes; Fluid consistency: Thin  Diet effective now             Neurologic Exam:   Mental Status: Alert, oriented, thought content appropriate.  Speech fluent without evidence of aphasia.  Able to follow 3 step commands without difficulty. Attention span and concentration seemed appropriate  Cranial Nerves: II: Discs flat bilaterally; Visual fields grossly normal, pupils equal, round, reactive to light and accommodation III,IV, VI: ptosis not present, extra-ocular motions intact bilaterally V,VII: smile symmetric, facial light touch sensation intact VIII: hearing normal bilaterally IX,X: gag reflex present XI: bilateral shoulder shrug XII: midline tongue extension Motor: Right :  Upper extremity   5/5 Without pronator drift      Left: Upper extremity   4/5 with pronator drift Right:   Lower extremity   5/5                                          Left: Lower extremity   4+/5 Tone and bulk:normal tone throughout; no atrophy noted Right  hand resting tremors Sensory: Pinprick and light touch intact bilaterally Deep Tendon Reflexes: 2+ and symmetric throughout. Plantars: Right: mute                              Left: mute Cerebellar: Finger-to-nose testing intact on the right. Dysmetric on the left. Unable to perform Heel to shin testing  Gait: not tested due to safety concerns   Lab Results: Basic Metabolic Panel: Recent Labs  Lab 12/02/17 1214 12/03/17 0940 12/05/17 1153  NA 136 135 134*  K 3.6 3.6 3.5  CL 96* 97* 96*  CO2 30 29 28   GLUCOSE 242* 215* 234*  BUN 11 12 15   CREATININE 0.94 0.91 1.03  CALCIUM 9.3 8.9 8.8*    Liver Function Tests: No results for input(s): AST, ALT, ALKPHOS, BILITOT, PROT, ALBUMIN in the last 168 hours. No results for input(s): LIPASE, AMYLASE in the last 168 hours. Recent Labs  Lab 12/05/17 1153  AMMONIA 28    CBC: Recent Labs  Lab 12/02/17 1214 12/03/17 0940  WBC 7.7 7.6  HGB 16.1 15.1  HCT 48.6 45.1  MCV 88.7 88.3  PLT 200 185  Cardiac Enzymes: Recent Labs  Lab 12/02/17 1214  TROPONINI <0.03    Lipid Panel: Recent Labs  Lab 12/03/17 0940  CHOL 159  TRIG 84  HDL 32*  CHOLHDL 5.0  VLDL 17  LDLCALC 110*    CBG: Recent Labs  Lab 12/05/17 1139 12/05/17 1626 12/05/17 2120 12/05/17 2256 12/06/17 0815  GLUCAP 207* 321* 281* 308* 275*    Microbiology: Results for orders placed or performed during the hospital encounter of 02/21/17  C difficile quick scan w PCR reflex     Status: None   Collection Time: 02/21/17  8:02 AM  Result Value Ref Range Status   C Diff antigen NEGATIVE NEGATIVE Final   C Diff toxin NEGATIVE NEGATIVE Final   C Diff interpretation No C. difficile detected.  Final    Comment: Performed at [facility], [address] [place], [place] [address]  Gastrointestinal Panel by PCR , Stool     Status: None   Collection Time: 02/21/17  8:02 AM  Result Value Ref Range Status   Campylobacter species NOT DETECTED NOT  DETECTED Final   Plesimonas shigelloides NOT DETECTED NOT DETECTED Final   Salmonella species NOT DETECTED NOT DETECTED Final   Yersinia enterocolitica NOT DETECTED NOT DETECTED Final   Vibrio species NOT DETECTED NOT DETECTED Final   Vibrio cholerae NOT DETECTED NOT DETECTED Final   Enteroaggregative E coli (EAEC) NOT DETECTED NOT DETECTED Final   Enteropathogenic E coli (EPEC) NOT DETECTED NOT DETECTED Final   Enterotoxigenic E coli (ETEC) NOT DETECTED NOT DETECTED Final   Shiga like toxin producing E coli (STEC) NOT DETECTED NOT DETECTED Final   Shigella/Enteroinvasive E coli (EIEC) NOT DETECTED NOT DETECTED Final   Cryptosporidium NOT DETECTED NOT DETECTED Final   Cyclospora cayetanensis NOT DETECTED NOT DETECTED Final   Entamoeba histolytica NOT DETECTED NOT DETECTED Final   Giardia lamblia NOT DETECTED NOT DETECTED Final   Adenovirus F40/41 NOT DETECTED NOT DETECTED Final   Astrovirus NOT DETECTED NOT DETECTED Final   Norovirus GI/GII NOT DETECTED NOT DETECTED Final   Rotavirus A NOT DETECTED NOT DETECTED Final   Sapovirus (I, II, IV, and V) NOT DETECTED NOT DETECTED Final    Comment: Performed at [facility], [address] [place], [place] [address]    Coagulation Studies: No results for input(s): LABPROT, INR in the last 72 hours.  Imaging: Dg Chest 2 View  Result Date: 12/05/2017 CLINICAL DATA:  Wheezing. EXAM: CHEST - 2 VIEW COMPARISON:  12/02/2017. FINDINGS: Mediastinum hilar structures normal. Cardiomegaly with normal pulmonary vascularity. No focal alveolar infiltrate. Low lung volumes. No pleural effusion or pneumothorax. Degenerative change thoracic spine. IMPRESSION: 1. Severe cardiomegaly. Normal pulmonary vascularity. No focal infiltrate noted. 2.  Mild basilar atelectasis. Electronically Signed   By: Marcello Moores  Register   On: 12/05/2017 11:10   Ct Head Wo Contrast  Result Date: 12/05/2017 CLINICAL DATA:  77 year old male with acute infarct posterior  limb right internal capsule. Subsequent encounter. EXAM: CT HEAD WITHOUT CONTRAST TECHNIQUE: Contiguous axial images were obtained from the base of the skull through the vertex without intravenous contrast. COMPARISON:  12/02/2017 brain MR and head CT. 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 Chronic microvascular changes. No intracranial hemorrhage. 2.4 cm anterior left middle cranial fossa meningioma displaces adjacent brain without associated vasogenic edema. Vascular: Vascular calcifications. Skull: No skull fracture detected. Slight irregularity left greater wing of sphenoid adjacent to meningioma. Sinuses/Orbits: Remote right inferior orbital floor fracture. Mucosal thickening right  maxillary sinus. Other: Mastoid air cells and middle ear cavities are clear. IMPRESSION: 1. 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 2. Chronic microvascular changes. 3. 2.4 cm anterior left middle cranial fossa meningioma displaces adjacent brain without associated vasogenic edema. 4. Remote right inferior orbital floor fracture. 5. Mucosal thickening right maxillary sinus. Electronically Signed   By: Genia Del M.D.   On: 12/05/2017 08:26    Medications:  I have reviewed the patient's current medications. Prior to Admission:  Medications Prior to Admission  Medication Sig Dispense Refill Last Dose  . apixaban (ELIQUIS) 5 MG TABS tablet Take 1 tablet (5 mg total) by mouth 2 (two) times daily. 60 tablet 1 12/02/2017 at 0800  . benzonatate (TESSALON) 100 MG capsule Take 100 mg by mouth 3 (three) times daily as needed for cough.   Past Month at PRN  . budesonide-formoterol (SYMBICORT) 160-4.5 MCG/ACT inhaler Inhale 2 puffs into the lungs 2 (two) times daily.   12/02/2017 at 0800  . digoxin (LANOXIN) 0.25 MG tablet Take 1 tablet (0.25 mg total) by mouth daily. (Patient taking differently: Take 0.125 mg by mouth daily. ) 30 tablet 0 12/02/2017 at 0800  .  donepezil (ARICEPT) 5 MG tablet Take 5 mg by mouth at bedtime.   12/01/2017 at 2000  . DULoxetine (CYMBALTA) 60 MG capsule Take 60 mg by mouth daily.   12/02/2017 at 0800  . esomeprazole (NEXIUM) 40 MG capsule Take 40 mg by mouth daily at 12 noon.   12/01/2017 at Unknown time  . ferrous sulfate 325 (65 FE) MG EC tablet Take 325 mg by mouth daily.   12/02/2017 at 0800  . finasteride (PROSCAR) 5 MG tablet Take 1 tablet (5 mg total) by mouth daily. 90 tablet 3 12/02/2017 at 0800  . furosemide (LASIX) 20 MG tablet Take 20 mg by mouth 2 (two) times daily.   12/02/2017 at 0800  . glimepiride (AMARYL) 2 MG tablet Take 3 mg by mouth 2 (two) times daily.    12/02/2017 at 0800  . guaiFENesin-dextromethorphan (ROBITUSSIN DM) 100-10 MG/5ML syrup Take 5 mLs by mouth every 4 (four) hours as needed for cough. 118 mL 0 Unknown at PRN  . insulin degludec (TRESIBA FLEXTOUCH) 100 UNIT/ML SOPN FlexTouch Pen Inject 15 Units into the skin at bedtime.   12/01/2017 at 2000  . ipratropium-albuterol (DUONEB) 0.5-2.5 (3) MG/3ML SOLN Inhale 3 mLs into the lungs every 6 (six) hours as needed for shortness of breath.  3 Unknown at PRN  . levothyroxine (SYNTHROID, LEVOTHROID) 112 MCG tablet Take 112 mcg by mouth daily before breakfast.    12/02/2017 at 0700  . Melatonin 3 MG TABS Take 3 mg by mouth at bedtime as needed (sleep).    Unknown at PRN  . metoprolol tartrate (LOPRESSOR) 100 MG tablet Take 1 tablet (100 mg total) by mouth 2 (two) times daily. 60 tablet 0 12/02/2017 at 0800  . montelukast (SINGULAIR) 10 MG tablet Take 10 mg by mouth daily.   12/02/2017 at 0800  . omega-3 acid ethyl esters (LOVAZA) 1 G capsule Take 2 g by mouth 2 (two) times daily.    12/02/2017 at 0800  . potassium chloride SA (K-DUR,KLOR-CON) 20 MEQ tablet Take 40 mEq by mouth See admin instructions. Take 2 tablets (40MEQ) by mouth twice daily for 3 days as directed   12/02/2017 at 0800  . glimepiride (AMARYL) 1 MG tablet Take 1 tablet (1 mg total) by mouth daily with  breakfast. (Patient not taking: Reported on  12/02/2017) 30 tablet 0 Not Taking at Unknown time   Scheduled: . apixaban  5 mg Oral BID  . atorvastatin  40 mg Oral q1800  . donepezil  5 mg Oral QHS  . DULoxetine  60 mg Oral Daily  . ferrous sulfate  325 mg Oral Daily  . finasteride  5 mg Oral Daily  . furosemide  20 mg Oral BID  . insulin aspart  0-9 Units Subcutaneous TID WC  . ipratropium-albuterol  3 mL Inhalation Q6H  . levothyroxine  112 mcg Oral QAC breakfast  . methylPREDNISolone (SOLU-MEDROL) injection  60 mg Intravenous Q24H  . metoprolol tartrate  25 mg Oral Q8H  . mometasone-formoterol  2 puff Inhalation BID  . omega-3 acid ethyl esters  2 g Oral BID  . pantoprazole  40 mg Oral Daily  . potassium chloride  10 mEq Oral Daily  . risperiDONE  0.5 mg Oral QHS  . senna-docusate  1 tablet Oral BID  . sodium chloride flush  3 mL Intravenous Q12H  . sodium chloride flush  3 mL Intravenous Q12H  . triamcinolone acetonide  40 mg Intra-articular Once   Patient seen and examined.  Clinical course and management discussed.  Necessary edits performed.  I agree with the above.  Assessment and plan of care developed and discussed below.    Assessment: 77 y.o male pertinent history of CHF, coronary artery disease, dementia, diabetes mellitus, atrial fibrillation on anticoagulation with Eliquis, hyperlipidemia, hypertension, hypothyroidism, obstructive sleep apnea, thyroid disease and COPD presenting with left sided weakness and multiple falls.  Found to have acute infarction in the posterior limb of internal capsule on imaging.  Etiology likely embolic.  Neuro exams fluctuating but much improved today. He is able to break gravity and hold arm with drift noted. Concerns for recurrent event.  Repeat MRI brain without contrast pending.  Plan:  1. MRI of the brain without contrast pending 2. Prophylactic therapy-Anticoagulation: Apixaban- dose 5 mg twice daily 3. Statin with goal LDL <70 4.  Continue with PT/OT/ Speech consult 5. Telemetry monitoring 6. Frequent neuro checks  Due to language barrier, an interpreter was present during the history-taking and subsequent discussion (and for part of the physical exam) with this patient.  This patient was staffed with Dr. Magda Paganini, Doy Mince who personally evaluated patient, reviewed documentation and agreed with assessment and plan of care as above.  Rufina Falco, DNP, FNP-BC Board certified Nurse Practitioner Neurology Department   LOS: 4 days   12/06/2017  9:29 AM  Alexis Goodell, MD Neurology [phone]  12/06/2017  12:44 PM

## 2017-12-06 NOTE — Progress Notes (Signed)
PT Cancellation Note  Patient Details Name: Derek Shelton MRN: [id number] DOB: 11-15-1940   Cancelled Treatment:    Reason Eval/Treat Not Completed: Patient not medically ready;Other (comment)(PT observed telemetry prior to entering room, HR flucuating from 120s-150 BPM, PT entered room to assess patient. Sleeping in bed, HR still flucating, ~3 minutes. Nursing notified, pt held until more medically appropriate. )  [name] PT, DPT 3:14 PM,12/06/17 [phone]

## 2017-12-06 NOTE — Progress Notes (Signed)
OT Cancellation Note  Patient Details Name: NEVILLE WALSTON MRN: [id number] DOB: 12-Jul-1940   Cancelled Treatment:    Reason Eval/Treat Not Completed: Medical issues which prohibited therapy. HR fluctuating this afternoon 120's-150 BPM. Will hold OT tx this date and re-attempt next date as medically appropriate.   Jeni Salles, MPH, MS, OTR/L ascom [phone] 12/06/17, 3:57 PM

## 2017-12-06 NOTE — Care Management (Signed)
Barrier to discharge- Uncontrolled heart rate with activity.  Heart rate increases to 130's.  Cardiac meds being adjusted

## 2017-12-06 NOTE — Progress Notes (Signed)
SUBJECTIVE: Feeling better today, energy is better, no chest pain or shortness of breath. Left sided weakness appears to be improving.   Vitals:   12/06/17 0219 12/06/17 0516 12/06/17 0819 12/06/17 0859  BP:  114/70 90/72 106/82  Pulse:  78 (!) 156 (!) 123  Resp:  20 (!) 25   Temp:  (!) 97.5 F (36.4 C)    TempSrc:  Oral    SpO2: 95% 96% 100%   Weight:      Height:        Intake/Output Summary (Last 24 hours) at 12/06/2017 0907 Last data filed at 12/06/2017 0519 Gross per 24 hour  Intake 544.21 ml  Output 1300 ml  Net -755.79 ml    LABS: Basic Metabolic Panel: Recent Labs    12/03/17 0940 12/05/17 1153  NA 135 134*  K 3.6 3.5  CL 97* 96*  CO2 29 28  GLUCOSE 215* 234*  BUN 12 15  CREATININE 0.91 1.03  CALCIUM 8.9 8.8*   Liver Function Tests: No results for input(s): AST, ALT, ALKPHOS, BILITOT, PROT, ALBUMIN in the last 72 hours. No results for input(s): LIPASE, AMYLASE in the last 72 hours. CBC: Recent Labs    12/03/17 0940  WBC 7.6  HGB 15.1  HCT 45.1  MCV 88.3  PLT 185   Cardiac Enzymes: No results for input(s): CKTOTAL, CKMB, CKMBINDEX, TROPONINI in the last 72 hours. BNP: Invalid input(s): POCBNP D-Dimer: No results for input(s): DDIMER in the last 72 hours. Hemoglobin A1C: No results for input(s): HGBA1C in the last 72 hours. Fasting Lipid Panel: Recent Labs    12/03/17 0940  CHOL 159  HDL 32*  LDLCALC 110*  TRIG 84  CHOLHDL 5.0   Thyroid Function Tests: Recent Labs    12/05/17 1153  TSH 2.721   Anemia Panel: No results for input(s): VITAMINB12, FOLATE, FERRITIN, TIBC, IRON, RETICCTPCT in the last 72 hours.   PHYSICAL EXAM General: Well developed, well nourished, in no acute distress HEENT:  Normocephalic and atramatic Neck:  No JVD.  Lungs: Clear bilaterally to auscultation and percussion. Heart: HRRR . Normal S1 and S2 without gallops or murmurs.  Abdomen: Bowel sounds are positive, abdomen soft and non-tender  Msk:  Back  normal, normal gait. Normal strength and tone for age. Extremities: No clubbing, cyanosis or edema.   Neuro: Alert, sitting in chair, moving extremities Psych:  Good affect, responds appropriately  TELEMETRY: Atrial fibrillation with RVR 125bpm  ASSESSMENT AND PLAN: Acute CVA due to nonhemorrhagic stroke and pauses on telemetry. Rhythm remains in atrial fibrillation and now ventricular rate is increasing to 120s-130s. Blood pressure remains low-normal. Will increase metoprolol to 50mg  BID with parameters for blood pressure. May consider adding digoxin for rate control if rate continues to be elevated. Continue Eliquis. Dr. Humphrey Rolls discussed with family and will not pursue pacemaker at this time.  Principal Problem:   Acute CVA (cerebrovascular accident) [facility]) Active Problems:   Acute ischemic stroke ([place])    Jake Bathe, NP-C 12/06/2017 9:07 AM Cell: [phone]

## 2017-12-06 NOTE — Plan of Care (Signed)
  Problem: Nutrition: Goal: Risk of aspiration will decrease Outcome: Progressing   Problem: Ischemic Stroke/TIA Tissue Perfusion: Goal: Complications of ischemic stroke/TIA will be minimized Outcome: Progressing

## 2017-12-06 NOTE — Progress Notes (Signed)
PT Cancellation Note  Patient Details Name: Derek Shelton MRN: [id number] DOB: [birth year]   Cancelled Treatment:    Reason Eval/Treat Not Completed: Other (comment)(Per nursing, pt has just gotten into bed and is very sleepy. PT entered room, pt sleeping soundly. Family requesting that PT return after patient gets some sleep. PT will follow up as able.)   [name] PT, DPT 1:20 PM,12/06/17 [phone]

## 2017-12-07 LAB — GLUCOSE, CAPILLARY
GLUCOSE-CAPILLARY: 327 mg/dL — AB (ref 70–99)
Glucose-Capillary: 270 mg/dL — ABNORMAL HIGH (ref 70–99)
Glucose-Capillary: 305 mg/dL — ABNORMAL HIGH (ref 70–99)
Glucose-Capillary: 305 mg/dL — ABNORMAL HIGH (ref 70–99)

## 2017-12-07 MED ORDER — INSULIN DETEMIR 100 UNIT/ML ~~LOC~~ SOLN
8.0000 [IU] | Freq: Every day | SUBCUTANEOUS | Status: DC
  Administered 2017-12-07 – 2017-12-08 (×2): 8 [IU] via SUBCUTANEOUS
  Filled 2017-12-07 (×3): qty 0.08

## 2017-12-07 MED ORDER — METOPROLOL TARTRATE 50 MG PO TABS
50.0000 mg | ORAL_TABLET | ORAL | Status: AC
  Administered 2017-12-07: 50 mg via ORAL
  Filled 2017-12-07: qty 1

## 2017-12-07 MED ORDER — DIGOXIN 125 MCG PO TABS
0.1250 mg | ORAL_TABLET | Freq: Every day | ORAL | Status: DC
  Administered 2017-12-07 – 2017-12-08 (×2): 0.125 mg via ORAL
  Filled 2017-12-07 (×2): qty 1

## 2017-12-07 MED ORDER — METOPROLOL TARTRATE 50 MG PO TABS
100.0000 mg | ORAL_TABLET | Freq: Three times a day (TID) | ORAL | Status: DC
  Administered 2017-12-07 (×2): 100 mg via ORAL
  Filled 2017-12-07 (×3): qty 2

## 2017-12-07 NOTE — Progress Notes (Signed)
Physical Therapy Treatment Patient Details Name: Derek Shelton MRN: [id number] DOB: [birth year] Today's Date: 12/07/2017    History of Present Illness 77 y.o. male with a known history of asthma, congestive heart failure, COPD, coronary artery disease, dementia, diabetes without complication, atrial fibrillation, hyperlipidemia, hypertension, hypothyroidism, sleep apnea-had episode of weakness on left side and confusion 3 days ago while out for shopping.  He was taken to [facility]-where after MRI and echocardiogram found negative for acute stroke, suspected to have this episode due to hypoglycemia and sent home.  A few days later (11/7) he again had L side weakness, confusion and 2 falls.  CT and MRI confirm new CVA.    PT Comments    Patient easily woken, up in chair and family at bedside. Language line utilized throughout session, interpretor [id number]. Prior to session pt HR fluctuating from 108-125, monitored throughout session. Highest HR noted after several sit to stands, in 130s. Sitting break with HR return to 110-115 BPM. Patient able to perform multiple reps of sit to stands from chair with modAx1, extensive verbal/tactile cues for hand placement/sequencing of movements.  Patient able to ambulate ~70ft total during session. mod-maxAx1 during gait, significant difficulty controlling LLE, max encouragement needed from wife/PT to perform, complained of weakness/pain preventing further ambulation. Pt impulsive with stand to sit, very close chair follow utilized for safety. Overall the patient demonstrated slight improvement in activity tolerance, ambulation, and participation. The patient would benefit from continued PT to continue to progress towards goals to maximize independence, mobility, and gait.    Follow Up Recommendations  SNF     Equipment Recommendations  Other (comment)(TBD)    Recommendations for Other Services       Precautions / Restrictions  Precautions Precautions: Fall Restrictions Weight Bearing Restrictions: No    Mobility  Bed Mobility               General bed mobility comments: Pt up in chair at start of session  Transfers Overall transfer level: Needs assistance Equipment used: Rolling walker (2 wheeled) Transfers: Sit to/from Stand Sit to Stand: Mod assist         General transfer comment: x5 this session. improved quality of motion with proper use of chair arms. modAx1 for immediate standing balance several times, mod verbal cues for posture.  Ambulation/Gait Ambulation/Gait assistance: Mod assist;Max assist;+2 safety/equipment Gait Distance (Feet): 3 Feet Assistive device: Rolling walker (2 wheeled)   Gait velocity: decreased   General Gait Details: Patient able to ambulate ~74ft total during session. mod-maxAx1 during gait, significant difficulty controlling LLE, max encouragement needed from wife/PT to perform, complained of weakness/pain preventing further ambulation. Pt impulsive with stand to sit, very close chair follow utilized.   Stairs             Wheelchair Mobility    Modified Rankin (Stroke Patients Only)       Balance Overall balance assessment: Needs assistance Sitting-balance support: Single extremity supported;Feet supported Sitting balance-Leahy Scale: Poor Sitting balance - Comments: difficulty sitting edge of chair in prep for transfer, CGA for safety.   Standing balance support: Bilateral upper extremity supported Standing balance-Leahy Scale: Poor Standing balance comment: Pt highly reliant on the walker, mod verbal cues to maintain posture;                             Cognition Arousal/Alertness: Awake/alert Behavior During Therapy: Flat affect Overall Cognitive Status: Difficult to assess  Exercises      General Comments        Pertinent Vitals/Pain Pain Assessment: Faces Faces  Pain Scale: Hurts little more Pain Location: knees    Home Living                      Prior Function            PT Goals (current goals can now be found in the care plan section) Progress towards PT goals: Progressing toward goals(progressing very slowly towards goals.)    Frequency    7X/week      PT Plan Current plan remains appropriate    Co-evaluation              AM-PAC PT "6 Clicks" Daily Activity  Outcome Measure  Difficulty turning over in bed (including adjusting bedclothes, sheets and blankets)?: A Lot Difficulty moving from lying on back to sitting on the side of the bed? : Unable Difficulty sitting down on and standing up from a chair with arms (e.g., wheelchair, bedside commode, etc,.)?: Unable Help needed moving to and from a bed to chair (including a wheelchair)?: Total Help needed walking in hospital room?: A Lot Help needed climbing 3-5 steps with a railing? : Total 6 Click Score: 8    End of Session Equipment Utilized During Treatment: Gait belt Activity Tolerance: Patient limited by fatigue;Patient limited by pain Patient left: with chair alarm set;in chair;with family/visitor present;with call bell/phone within reach Nurse Communication: Mobility status PT Visit Diagnosis: Muscle weakness (generalized) (M62.81);Difficulty in walking, not elsewhere classified (R26.2)     Time: 2595-6387 PT Time Calculation (min) (ACUTE ONLY): 41 min  Charges:  $Gait Training: 8-22 mins $Therapeutic Activity: 23-37 mins                     [name] PT, DPT 11:43 AM,12/07/17 [phone]

## 2017-12-07 NOTE — Progress Notes (Signed)
SUBJECTIVE: Feeling well today, energy is better, no chest pain or shortness of breath. Left sided weakness continues to improve.   Vitals:   12/07/17 0235 12/07/17 0435 12/07/17 0745 12/07/17 0844  BP:  102/83 114/90   Pulse:  (!) 139 (!) 117   Resp:   19   Temp:  98.5 F (36.9 C) 98.5 F (36.9 C)   TempSrc:  Oral Oral   SpO2: 95% 97% 97% 97%  Weight:      Height:        Intake/Output Summary (Last 24 hours) at 12/07/2017 0852 Last data filed at 12/07/2017 0531 Gross per 24 hour  Intake 123 ml  Output 1200 ml  Net -1077 ml    LABS: Basic Metabolic Panel: Recent Labs    12/05/17 1153  NA 134*  K 3.5  CL 96*  CO2 28  GLUCOSE 234*  BUN 15  CREATININE 1.03  CALCIUM 8.8*   Liver Function Tests: No results for input(s): AST, ALT, ALKPHOS, BILITOT, PROT, ALBUMIN in the last 72 hours. No results for input(s): LIPASE, AMYLASE in the last 72 hours. CBC: No results for input(s): WBC, NEUTROABS, HGB, HCT, MCV, PLT in the last 72 hours. Cardiac Enzymes: No results for input(s): CKTOTAL, CKMB, CKMBINDEX, TROPONINI in the last 72 hours. BNP: Invalid input(s): POCBNP D-Dimer: No results for input(s): DDIMER in the last 72 hours. Hemoglobin A1C: No results for input(s): HGBA1C in the last 72 hours. Fasting Lipid Panel: No results for input(s): CHOL, HDL, LDLCALC, TRIG, CHOLHDL, LDLDIRECT in the last 72 hours. Thyroid Function Tests: Recent Labs    12/05/17 1153  TSH 2.721   Anemia Panel: No results for input(s): VITAMINB12, FOLATE, FERRITIN, TIBC, IRON, RETICCTPCT in the last 72 hours.   PHYSICAL EXAM General: Well developed, well nourished, in no acute distress HEENT:  Normocephalic and atramatic Neck:  No JVD.  Lungs: Clear bilaterally to auscultation and percussion. Heart: HRRR . Normal S1 and S2 without gallops or murmurs.  Abdomen: Bowel sounds are positive, abdomen soft and non-tender  Msk:  Back normal, normal gait. Normal strength and tone for  age. Extremities: No clubbing, cyanosis or edema.   Neuro: Alert and oriented X 3. Psych:  Good affect, responds appropriately  TELEMETRY: Afib RVR 120-140bpm  ASSESSMENT AND PLAN: Acute CVA due to nonhemorrhagic stroke and pauses on telemetry: Rhythm remains in atrial fibrillation with RVR ventricular rate of 120s-140s. Blood pressure remains low-normal.  Metoprolol at 100mg  TID, will add digoxin 155mcg/day.  Advise close outpatient follow up with Dr. Humphrey Rolls, Monday 11am.   Continue Eliquis. Dr. Humphrey Rolls discussed with family and will not pursue pacemaker at this time.  Principal Problem:   Acute CVA (cerebrovascular accident) [facility]) Active Problems:   Acute ischemic stroke ([place])    Derek Bathe, NP-C 12/07/2017 8:52 AM Cell: [phone]

## 2017-12-07 NOTE — Progress Notes (Signed)
Inpatient Diabetes Program Recommendations  AACE/ADA: New Consensus Statement on Inpatient Glycemic Control (2019)  Target Ranges:  Prepandial:   less than 140 mg/dL      Peak postprandial:   less than 180 mg/dL (1-2 hours)      Critically ill patients:  140 - 180 mg/dL  Results for Derek Shelton, Derek Shelton (MRN [id number]) as of 12/07/2017 08:46  Ref. Range 12/06/2017 08:15 12/06/2017 11:43 12/06/2017 16:40 12/06/2017 21:09 12/07/2017 07:51  Glucose-Capillary Latest Ref Range: 70 - 99 mg/dL 275 (H) 387 (H) 332 (H) 267 (H) 270 (H)   Results for Derek Shelton, Derek Shelton (MRN [id number]) as of 12/06/2017 10:23  Ref. Range 12/05/2017 09:03 12/05/2017 11:39 12/05/2017 16:26 12/05/2017 21:20 12/05/2017 22:56  Glucose-Capillary Latest Ref Range: 70 - 99 mg/dL 158 (H) 207 (H) 321 (H) 281 (H) 308 (H)   Review of Glycemic Control  Diabetes history: DM2 Outpatient Diabetes medications: Tresiba 15 units QHS, Amaryl 3 mg BID Current orders for Inpatient glycemic control: Amaryl 1 mg BID, Novolog 0-9 units TID with meals; Solumedrol 60 mg Q24H  Inpatient Diabetes Program Recommendations:  Insulin - Basal: If steroids are continued, please consider ordering Lantus 10 units Q24H starting now. Insulin-Correction: Please consider ordering Novolog 0-5 units QHS for bedtime correction. Insulin - Meal Coverage: If steroids are continued and post prandial glucose continues to be greater than 180 mg/dl, please consider ordering Novolog 3 units TID with meals for meal coverage if patient eats at least 50% of meals.  NOTE: Noted patient is  ordered Solumedrol 60 mg Q24H and received one time Kenalog 40 mg on 12/05/17 which is contributing to hyperglycemia.   Thanks, Barnie Alderman, RN, MSN, CDE Diabetes Coordinator Inpatient Diabetes Program [phone] (Team Pager from 8am to 5pm)

## 2017-12-07 NOTE — Progress Notes (Addendum)
Subjective: Patient [place] up in the recliner with family at bedside. He report that he was able to stand up with PT today and ambulated about 3 ft. Per son, he still seem to have difficulty controlling his left leg. He recently received injections in his left knee due to significant pain and difficulty with walking. Weakness in his left upper extremity however continues to improve. No new complaints or worsening symptoms.  Objective: Current vital signs: BP 114/90 (BP Location: Left Arm)   Pulse (!) 117   Temp 98.5 F (36.9 C) (Oral)   Resp 19   Ht 5\' 4"  (1.626 m)   Wt 87.2 kg   SpO2 97%   BMI 33.01 kg/m  Vital signs in last 24 hours: Temp:  [97.5 F (36.4 C)-98.5 F (36.9 C)] 98.5 F (36.9 C) (11/14 0745) Pulse Rate:  [56-140] 117 (11/14 0745) Resp:  [19] 19 (11/14 0745) BP: (102-114)/(48-90) 114/90 (11/14 0745) SpO2:  [95 %-97 %] 97 % (11/14 0844)  Intake/Output from previous day: 11/13 0701 - 11/14 0700 In: 123 [P.O.:120; I.V.:3] Out: 1200 [Urine:1200] Intake/Output this shift: Total I/O In: 120 [P.O.:120] Out: 300 [Urine:300] Nutritional status:  Diet Order            Diet heart healthy/carb modified Room service appropriate? Yes; Fluid consistency: Thin  Diet effective now             Neurologic Exam: Mental Status: Alert, oriented, thought content appropriate. Speech fluent without evidence of aphasia. Able to follow 3 step commands without difficulty. Attention span and concentration seemed appropriate  Cranial Nerves: II: Discs flat bilaterally; Visual fields grossly normal, pupils equal, round, reactive to light and accommodation III,IV, VI: ptosis not present, extra-ocular motions intact bilaterally V,VII: smile symmetric, facial light touch sensationintact VIII: hearing normal bilaterally IX,X: gag reflex present XI: bilateral shoulder shrug XII: midline tongue extension Motor: Right :Upper extremity 5/5Without pronator driftLeft: Upper  extremity 4/5 with pronator drift Right:Lower extremity 5/5Left: Lower extremity 4+/5 Tone and bulk:normal tone throughout; no atrophy noted Right hand resting tremors Sensory: Pinprick and light touchintact bilaterally Deep Tendon Reflexes: 2+ and symmetric throughout. Plantars: Right:muteLeft: mute Cerebellar: Finger-to-nosetesting intact on the right. Dysmetric on the left.Unable to perform Heel to shin testing  Gait: not tested due to safety concerns  Lab Results: Basic Metabolic Panel: Recent Labs  Lab 12/02/17 1214 12/03/17 0940 12/05/17 1153  NA 136 135 134*  K 3.6 3.6 3.5  CL 96* 97* 96*  CO2 30 29 28   GLUCOSE 242* 215* 234*  BUN 11 12 15   CREATININE 0.94 0.91 1.03  CALCIUM 9.3 8.9 8.8*    Liver Function Tests: No results for input(s): AST, ALT, ALKPHOS, BILITOT, PROT, ALBUMIN in the last 168 hours. No results for input(s): LIPASE, AMYLASE in the last 168 hours. Recent Labs  Lab 12/05/17 1153  AMMONIA 28    CBC: Recent Labs  Lab 12/02/17 1214 12/03/17 0940  WBC 7.7 7.6  HGB 16.1 15.1  HCT 48.6 45.1  MCV 88.7 88.3  PLT 200 185    Cardiac Enzymes: Recent Labs  Lab 12/02/17 1214  TROPONINI <0.03    Lipid Panel: Recent Labs  Lab 12/03/17 0940  CHOL 159  TRIG 84  HDL 32*  CHOLHDL 5.0  VLDL 17  LDLCALC 110*    CBG: Recent Labs  Lab 12/06/17 1143 12/06/17 1640 12/06/17 2109 12/07/17 0751 12/07/17 1123  GLUCAP 387* 332* 267* 270* 305*    Microbiology: Results for orders placed or performed  during the hospital encounter of 02/21/17  C difficile quick scan w PCR reflex     Status: None   Collection Time: 02/21/17  8:02 AM  Result Value Ref Range Status   C Diff antigen NEGATIVE NEGATIVE Final   C Diff toxin NEGATIVE NEGATIVE Final   C Diff interpretation No C. difficile detected.  Final    Comment: Performed at [facility], [address] [place], [place] [address]  Gastrointestinal Panel by PCR , Stool     Status: None   Collection Time: 02/21/17  8:02 AM  Result Value Ref Range Status   Campylobacter species NOT DETECTED NOT DETECTED Final   Plesimonas shigelloides NOT DETECTED NOT DETECTED Final   Salmonella species NOT DETECTED NOT DETECTED Final   Yersinia enterocolitica NOT DETECTED NOT DETECTED Final   Vibrio species NOT DETECTED NOT DETECTED Final   Vibrio cholerae NOT DETECTED NOT DETECTED Final   Enteroaggregative E coli (EAEC) NOT DETECTED NOT DETECTED Final   Enteropathogenic E coli (EPEC) NOT DETECTED NOT DETECTED Final   Enterotoxigenic E coli (ETEC) NOT DETECTED NOT DETECTED Final   Shiga like toxin producing E coli (STEC) NOT DETECTED NOT DETECTED Final   Shigella/Enteroinvasive E coli (EIEC) NOT DETECTED NOT DETECTED Final   Cryptosporidium NOT DETECTED NOT DETECTED Final   Cyclospora cayetanensis NOT DETECTED NOT DETECTED Final   Entamoeba histolytica NOT DETECTED NOT DETECTED Final   Giardia lamblia NOT DETECTED NOT DETECTED Final   Adenovirus F40/41 NOT DETECTED NOT DETECTED Final   Astrovirus NOT DETECTED NOT DETECTED Final   Norovirus GI/GII NOT DETECTED NOT DETECTED Final   Rotavirus A NOT DETECTED NOT DETECTED Final   Sapovirus (I, II, IV, and V) NOT DETECTED NOT DETECTED Final    Comment: Performed at [facility], [address] [place], [place] [address]    Coagulation Studies: No results for input(s): LABPROT, INR in the last 72 hours.  Imaging: Mr Brain Wo Contrast  Result Date: 12/06/2017 CLINICAL DATA:  Stroke follow-up. Left-sided weakness and multiple falls. EXAM: MRI HEAD WITHOUT CONTRAST TECHNIQUE: Multiplanar, multiecho pulse sequences of the brain and surrounding structures were obtained without intravenous contrast. COMPARISON:  Head CT 12/05/2017 FINDINGS: BRAIN: Abnormal diffusion restriction along the posterior limb of the right internal capsule. No other  diffusion abnormality. There is an extra-axial mass of the left middle cranial fossa that measures 2.7 x 2.6 x 3.0 cm. There is no signal abnormality of the underlying brain. The mass is in close proximity to the left orbital apex and the left foramen ovale. There is also likely extension into the left sphenopalatine fossa. Early confluent hyperintense T2-weighted signal of the periventricular and deep white matter, most commonly due to chronic ischemic microangiopathy. Generalized atrophy without lobar predilection. Susceptibility-sensitive sequences show no chronic microhemorrhage or superficial siderosis. VASCULAR: Major intracranial arterial and venous sinus flow voids are normal. SKULL AND UPPER CERVICAL SPINE: Calvarial bone marrow signal is normal. There is no skull base mass. Visualized upper cervical spine and soft tissues are normal. SINUSES/ORBITS: No fluid levels or advanced mucosal thickening. No mastoid or middle ear effusion. The orbits are normal. IMPRESSION: 1. Acute infarct of the posterior limb of the right internal capsule. No hemorrhage or mass effect. 2. 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 This could be better evaluated with dedicated skull base MRI with and without contrast, if there is clinical evidence of cranial neuropathy. 3. No signal abnormality of the brain adjacent  to the meningioma. 4. Atrophy and chronic ischemic microangiopathy. Electronically Signed   By: Ulyses Jarred M.D.   On: 12/06/2017 15:07    Medications:  I have reviewed the patient's current medications. Prior to Admission:  Medications Prior to Admission  Medication Sig Dispense Refill Last Dose  . apixaban (ELIQUIS) 5 MG TABS tablet Take 1 tablet (5 mg total) by mouth 2 (two) times daily. 60 tablet 1 12/02/2017 at 0800  . benzonatate (TESSALON) 100 MG capsule Take 100 mg by mouth 3 (three) times daily as needed for cough.   Past Month at PRN  .  budesonide-formoterol (SYMBICORT) 160-4.5 MCG/ACT inhaler Inhale 2 puffs into the lungs 2 (two) times daily.   12/02/2017 at 0800  . digoxin (LANOXIN) 0.25 MG tablet Take 1 tablet (0.25 mg total) by mouth daily. (Patient taking differently: Take 0.125 mg by mouth daily. ) 30 tablet 0 12/02/2017 at 0800  . donepezil (ARICEPT) 5 MG tablet Take 5 mg by mouth at bedtime.   12/01/2017 at 2000  . DULoxetine (CYMBALTA) 60 MG capsule Take 60 mg by mouth daily.   12/02/2017 at 0800  . esomeprazole (NEXIUM) 40 MG capsule Take 40 mg by mouth daily at 12 noon.   12/01/2017 at Unknown time  . ferrous sulfate 325 (65 FE) MG EC tablet Take 325 mg by mouth daily.   12/02/2017 at 0800  . finasteride (PROSCAR) 5 MG tablet Take 1 tablet (5 mg total) by mouth daily. 90 tablet 3 12/02/2017 at 0800  . furosemide (LASIX) 20 MG tablet Take 20 mg by mouth 2 (two) times daily.   12/02/2017 at 0800  . glimepiride (AMARYL) 2 MG tablet Take 3 mg by mouth 2 (two) times daily.    12/02/2017 at 0800  . guaiFENesin-dextromethorphan (ROBITUSSIN DM) 100-10 MG/5ML syrup Take 5 mLs by mouth every 4 (four) hours as needed for cough. 118 mL 0 Unknown at PRN  . insulin degludec (TRESIBA FLEXTOUCH) 100 UNIT/ML SOPN FlexTouch Pen Inject 15 Units into the skin at bedtime.   12/01/2017 at 2000  . ipratropium-albuterol (DUONEB) 0.5-2.5 (3) MG/3ML SOLN Inhale 3 mLs into the lungs every 6 (six) hours as needed for shortness of breath.  3 Unknown at PRN  . levothyroxine (SYNTHROID, LEVOTHROID) 112 MCG tablet Take 112 mcg by mouth daily before breakfast.    12/02/2017 at 0700  . Melatonin 3 MG TABS Take 3 mg by mouth at bedtime as needed (sleep).    Unknown at PRN  . metoprolol tartrate (LOPRESSOR) 100 MG tablet Take 1 tablet (100 mg total) by mouth 2 (two) times daily. 60 tablet 0 12/02/2017 at 0800  . montelukast (SINGULAIR) 10 MG tablet Take 10 mg by mouth daily.   12/02/2017 at 0800  . omega-3 acid ethyl esters (LOVAZA) 1 G capsule Take 2 g by mouth 2 (two)  times daily.    12/02/2017 at 0800  . potassium chloride SA (K-DUR,KLOR-CON) 20 MEQ tablet Take 40 mEq by mouth See admin instructions. Take 2 tablets (40MEQ) by mouth twice daily for 3 days as directed   12/02/2017 at 0800  . glimepiride (AMARYL) 1 MG tablet Take 1 tablet (1 mg total) by mouth daily with breakfast. (Patient not taking: Reported on 12/02/2017) 30 tablet 0 Not Taking at Unknown time   Scheduled: . apixaban  5 mg Oral BID  . atorvastatin  40 mg Oral q1800  . digoxin  0.125 mg Oral Daily  . donepezil  5 mg Oral QHS  . DULoxetine  60 mg Oral Daily  . ferrous sulfate  325 mg Oral Daily  . finasteride  5 mg Oral Daily  . furosemide  20 mg Oral BID  . glimepiride  1 mg Oral BID  . insulin aspart  0-9 Units Subcutaneous TID WC  . ipratropium-albuterol  3 mL Inhalation Q6H  . levothyroxine  112 mcg Oral QAC breakfast  . methylPREDNISolone (SOLU-MEDROL) injection  60 mg Intravenous Q24H  . metoprolol tartrate  100 mg Oral Q8H  . mometasone-formoterol  2 puff Inhalation BID  . omega-3 acid ethyl esters  2 g Oral BID  . pantoprazole  40 mg Oral Daily  . potassium chloride  10 mEq Oral Daily  . risperiDONE  0.5 mg Oral QHS  . senna-docusate  1 tablet Oral BID  . sodium chloride flush  3 mL Intravenous Q12H  . sodium chloride flush  3 mL Intravenous Q12H  . triamcinolone acetonide  40 mg Intra-articular Once   Patient seen and examined.  Clinical course and management discussed.  Necessary edits performed.  I agree with the above.  Assessment and plan of care developed and discussed below.    Assessment: 77 y.o male pertinent history of CHF, coronary artery disease, dementia, diabetes mellitus, atrial fibrillation on anticoagulation with Eliquis, hyperlipidemia, hypertension, hypothyroidism, obstructive sleep apnea, thyroid disease and COPD presenting with left sided weakness and multiple falls.  Found to have acute infarction in the posterior limb of internal capsule on imaging.   Etiology likely embolic.  Left side weakness continues to improve and appears much better today.  Repeat MRI brain reviewed and was unchanged from previous imaging.  Plan:  1. Prophylactic therapy-Anticoagulation: Apixaban- dose 5 mg twice daily to be continued 2. Statin with goal LDL <70 3. Continue with PT/OT/ Speech consult  This patient was staffed with Dr. Magda Paganini, Doy Mince who personally evaluated patient, reviewed documentation and agreed with assessment and plan of care as above.  Rufina Falco, DNP, FNP-BC Board certified Nurse Practitioner Neurology Department   LOS: 5 days   12/07/2017  12:13 PM   No further neurologic intervention is recommended at this time.  If further questions arise, please call or page at that time.  Thank you for allowing neurology to participate in the care of this patient.  Alexis Goodell, MD Neurology [phone]  12/07/2017  1:42 PM

## 2017-12-07 NOTE — Progress Notes (Signed)
[facility] NAME: Derek Shelton    MR#:  [id number]  DATE OF BIRTH:  [birth year]  SUBJECTIVE:  CHIEF COMPLAINT:   Chief Complaint  Patient presents with  . Fall  . Leg Pain   Sitting in a chair.  Working with physical therapy. Slept well.  Afebrile.  Heart rate continues to be high up to 130s.  REVIEW OF SYSTEMS:  Review of Systems  Constitutional: Positive for malaise/fatigue. Negative for chills and fever.  HENT: Negative for sore throat.   Eyes: Negative for blurred vision, double vision and pain.  Respiratory: Negative for cough, hemoptysis, shortness of breath and wheezing.   Cardiovascular: Negative for chest pain, palpitations, orthopnea and leg swelling.  Gastrointestinal: Negative for abdominal pain, constipation, diarrhea, heartburn, nausea and vomiting.  Genitourinary: Negative for dysuria and hematuria.  Musculoskeletal: Negative for back pain and joint pain.  Skin: Negative for rash.  Neurological: Positive for focal weakness. Negative for sensory change, speech change and headaches.  Endo/Heme/Allergies: Does not bruise/bleed easily.  Psychiatric/Behavioral: Negative for depression. The patient is not nervous/anxious.    DRUG ALLERGIES:   Allergies  Allergen Reactions  . Catapres [Clonidine Hcl]   . Coreg [Carvedilol]   . Penicillins    VITALS:  Blood pressure 114/90, pulse (!) 117, temperature 98.5 F (36.9 C), temperature source Oral, resp. rate 19, height 5\' 4"  (1.626 m), weight 87.2 kg, SpO2 97 %. PHYSICAL EXAMINATION:  Physical Exam  Constitutional: No distress.  HENT:  Head: Normocephalic.  Mouth/Throat: Oropharynx is clear and moist.  Eyes: Pupils are equal, round, and reactive to light. Conjunctivae and EOM are normal. No scleral icterus.  Neck: Normal range of motion. Neck supple. No JVD present. No tracheal deviation present.  Cardiovascular: Normal rate, regular rhythm and normal heart  sounds. Exam reveals no gallop.  No murmur heard. Pulmonary/Chest: Effort normal and breath sounds normal. No respiratory distress. He has no wheezes. He has no rales.  Abdominal: Soft. Bowel sounds are normal. He exhibits no distension. There is no tenderness. There is no rebound.  Musculoskeletal: Normal range of motion. He exhibits no edema or tenderness.  Neurological: He is alert. No cranial nerve deficit.  LLE 4-/5 LUE 5-/5 Right normal  Skin: No rash noted. No erythema.  Psychiatric: He has a normal mood and affect.   LABORATORY PANEL:  Male CBC Recent Labs  Lab 12/03/17 0940  WBC 7.6  HGB 15.1  HCT 45.1  PLT 185   ------------------------------------------------------------------------------------------------------------------ Chemistries  Recent Labs  Lab 12/05/17 1153  NA 134*  K 3.5  CL 96*  CO2 28  GLUCOSE 234*  BUN 15  CREATININE 1.03  CALCIUM 8.8*   RADIOLOGY:  Mr Brain Wo Contrast  Result Date: 12/06/2017 CLINICAL DATA:  Stroke follow-up. Left-sided weakness and multiple falls. EXAM: MRI HEAD WITHOUT CONTRAST TECHNIQUE: Multiplanar, multiecho pulse sequences of the brain and surrounding structures were obtained without intravenous contrast. COMPARISON:  Head CT 12/05/2017 FINDINGS: BRAIN: Abnormal diffusion restriction along the posterior limb of the right internal capsule. No other diffusion abnormality. There is an extra-axial mass of the left middle cranial fossa that measures 2.7 x 2.6 x 3.0 cm. There is no signal abnormality of the underlying brain. The mass is in close proximity to the left orbital apex and the left foramen ovale. There is also likely extension into the left sphenopalatine fossa. Early confluent hyperintense T2-weighted signal of the periventricular and deep white matter, most commonly due  to chronic ischemic microangiopathy. Generalized atrophy without lobar predilection. Susceptibility-sensitive sequences show no chronic microhemorrhage  or superficial siderosis. VASCULAR: Major intracranial arterial and venous sinus flow voids are normal. SKULL AND UPPER CERVICAL SPINE: Calvarial bone marrow signal is normal. There is no skull base mass. Visualized upper cervical spine and soft tissues are normal. SINUSES/ORBITS: No fluid levels or advanced mucosal thickening. No mastoid or middle ear effusion. The orbits are normal. IMPRESSION: 1. Acute infarct of the posterior limb of the right internal capsule. No hemorrhage or mass effect. 2. 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 This could be better evaluated with dedicated skull base MRI with and without contrast, if there is clinical evidence of cranial neuropathy. 3. No signal abnormality of the brain adjacent to the meningioma. 4. Atrophy and chronic ischemic microangiopathy. Electronically Signed   By: Ulyses Jarred M.D.   On: 12/06/2017 15:07   ASSESSMENT AND PLAN:   * Acute right internal capsule CVA Carotid duplex is unremarkable.. 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 Patient had some worsening generalized weakness which accentuated his left-sided weakness.  CT scan of the head and MRI were repeated and showed the same stroke.  *COPD exacerbation On steroids and nebs  * Paroxysmal atrial fibrillation with episodes of bradycardia No pacemaker needed. Stopped digoxin initially due to pauses.  Cardiology has restarted at 0.125 mg today.   Increase metoprolol 200 mg 3 times daily Eliquis Monitor on telemetry.  * Hyperlipidemia.  Lipitor.  * Hypertension Continue metoprolol but hold benazepril to avoid blood pressure slightly on the high side for perfusion.  * Diabetes Sliding scale coverage and continue home medications.  * Chronic diastolic heart failure Continue Lasix.  No  exacerbation symptoms.  All the records are reviewed and case discussed with Care Management/Social Worker. Management plans discussed with the patient, his wife and they are in agreement.  CODE STATUS: Full Code  TOTAL TIME TAKING CARE OF THIS PATIENT: 35 minutes.   Likely discharge tomorrow to rehab once heart rate is improved  Neita Carp M.D on 12/07/2017 at 11:15 AM  Between 7am to 6pm - Pager - [phone]  After 6pm go to [email or web address] - [facility]

## 2017-12-08 LAB — GLUCOSE, CAPILLARY
GLUCOSE-CAPILLARY: 278 mg/dL — AB (ref 70–99)
Glucose-Capillary: 225 mg/dL — ABNORMAL HIGH (ref 70–99)

## 2017-12-08 MED ORDER — METOPROLOL TARTRATE 50 MG PO TABS: 100.0000 mg | ORAL_TABLET | Freq: Two times a day (BID) | ORAL | Status: DC

## 2017-12-08 MED ORDER — DIGOXIN 125 MCG PO TABS: 0.1250 mg | ORAL_TABLET | Freq: Every day | ORAL | Status: DC

## 2017-12-08 NOTE — Care Management Important Message (Signed)
Copy of signed IM left with patient in room.  

## 2017-12-08 NOTE — Plan of Care (Signed)
  Problem: Education: Goal: Knowledge of General Education information will improve Description Including pain rating scale, medication(s)/side effects and non-pharmacologic comfort measures Outcome: Progressing   Problem: Health Behavior/Discharge Planning: Goal: Ability to manage health-related needs will improve Outcome: Progressing   Problem: Clinical Measurements: Goal: Ability to maintain clinical measurements within normal limits will improve Outcome: Progressing Goal: Will remain free from infection Outcome: Progressing Goal: Diagnostic test results will improve Outcome: Progressing Goal: Respiratory complications will improve Outcome: Progressing Goal: Cardiovascular complication will be avoided Outcome: Progressing   Problem: Activity: Goal: Risk for activity intolerance will decrease Outcome: Progressing   Problem: Nutrition: Goal: Adequate nutrition will be maintained Outcome: Progressing   Problem: Coping: Goal: Level of anxiety will decrease Outcome: Progressing   Problem: Elimination: Goal: Will not experience complications related to bowel motility Outcome: Progressing Goal: Will not experience complications related to urinary retention Outcome: Progressing   Problem: Pain Managment: Goal: General experience of comfort will improve Outcome: Progressing   Problem: Safety: Goal: Ability to remain free from injury will improve Outcome: Progressing   Problem: Skin Integrity: Goal: Risk for impaired skin integrity will decrease Outcome: Progressing   Problem: Education: Goal: Knowledge of disease or condition will improve Outcome: Progressing Goal: Knowledge of secondary prevention will improve Outcome: Progressing Goal: Knowledge of patient specific risk factors addressed and post discharge goals established will improve Outcome: Progressing   Problem: Coping: Goal: Will identify appropriate support needs Outcome: Progressing   Problem:  Health Behavior/Discharge Planning: Goal: Ability to manage health-related needs will improve Outcome: Progressing   Problem: Self-Care: Goal: Ability to participate in self-care as condition permits will improve Outcome: Progressing Goal: Ability to communicate needs accurately will improve Outcome: Progressing   Problem: Nutrition: Goal: Risk of aspiration will decrease Outcome: Progressing   Problem: Ischemic Stroke/TIA Tissue Perfusion: Goal: Complications of ischemic stroke/TIA will be minimized Outcome: Progressing   Problem: Spiritual Needs Goal: Ability to function at adequate level Outcome: Progressing

## 2017-12-08 NOTE — Progress Notes (Signed)
Pt BP was at 108/77 HR 76 and have a schedule metropolol 100 mg tablet. Notify prime. Will continue to monitor.

## 2017-12-08 NOTE — Clinical Social Work Note (Signed)
Patient is medically ready for discharge today. CSW notified patient's son Derek Shelton that patient will discharge to [facility] today. CSW also notified Claiborne Billings at [facility] of discharge today. Patient will be transported by EMS. RN to call report and call for transport.   [place] [phone]

## 2017-12-08 NOTE — Progress Notes (Signed)
Inpatient Diabetes Program Recommendations  AACE/ADA: New Consensus Statement on Inpatient Glycemic Control (2019)  Target Ranges:  Prepandial:   less than 140 mg/dL      Peak postprandial:   less than 180 mg/dL (1-2 hours)      Critically ill patients:  140 - 180 mg/dL   Results for Derek Shelton, Derek Shelton (MRN [id number]) as of 12/08/2017 09:02  Ref. Range 12/07/2017 07:51 12/07/2017 11:23 12/07/2017 16:58 12/07/2017 21:11 12/08/2017 07:32  Glucose-Capillary Latest Ref Range: 70 - 99 mg/dL 270 (H) 305 (H) 327 (H) 305 (H) 225 (H)   Review of Glycemic Control  Diabetes history: DM2 Outpatient Diabetes medications: Tresiba 15 units QHS, Amaryl 3 mg BID Current orders for Inpatient glycemic control: Levemir 8 units daily, Amaryl 1 mg BID, Novolog 0-9 units TID with meals; Solumedrol 60 mg Q24H  Inpatient Diabetes Program Recommendations:  Insulin - Basal: If steroids are continued, please consider increasing Levemir to 10 units daily. Insulin-Correction: Please consider ordering Novolog 0-5 units QHS for bedtime correction. Insulin - Meal Coverage: If steroids are continued, please consider ordering Novolog 3 units TID with meals for meal coverage if patient eats at least 50% of meals.  NOTE: Noted patient is  ordered Solumedrol 60 mg Q24H and received one time Kenalog 40 mg on 12/05/17 which is contributing to hyperglycemia.   Thanks, Barnie Alderman, RN, MSN, CDE Diabetes Coordinator Inpatient Diabetes Program [phone] (Team Pager from 8am to 5pm)

## 2017-12-08 NOTE — Progress Notes (Signed)
Pt transferred to [facility] via EMS. Discharge paperwork sent with EMS to facility. Spouse and son to follow.

## 2017-12-08 NOTE — Progress Notes (Signed)
Speech Language Pathology Treatment: Dysphagia  Patient Details Name: Derek Shelton MRN: [id number] DOB: 03/01/1940 Today's Date: 12/08/2017 Time: 1000-1030 SLP Time Calculation (min) (ACUTE ONLY): 30 min  Assessment / Plan / Recommendation Clinical Impression  Pt seen for ongoing toleration of diet and trials to upgrade diet today. He has tolerated the currently recommended dysphagia diet w/out overt difficulty per Wife/NSG. Pt continues to remain weak and does require Min-Mod cues during tasks of ADLs d/t his baseline Cognitive decline/Dementai. Upon entering room, noted pt responding to swallowing his Pills w/ water (using a large straw in the water jug) w/ mild coughing and throat clearing - fairly consistently even w/ cues by NSG to take small sips. Interpreter contacted and tx session engaged to address Pill swallowing and use of Straws.   Pt and Wife were educated on general aspiration precautions to include slowing down and taking Small, Single sips via CUP - no use of Straws further. He required verbal/visual cues to aid follow through w/ precautions. Discussed method of using Applesauce to swallow Pills WHOLE in order to avoid loose liquids and provide a more cohesive consistency for the Pills. Pt consumed po trials of thin liquids via Cup(water no ice) w/ no immediate coughing noted as pt took smaller, individual sips - pt's swallowing appeared coordinated w/ control of the liquid consistency. No immediate coughing noted when following precautions and attention given. Wife present was shown the Pill/Applesauce strategy and precautions via Interpreter and agreed.  Due to pt's baseline Dementia and need for monitoring and verbal cues during ADLs, recommend thin liquids VIA CUP and NO STRAWS; use of Applesauce when swallowing Pills (WHOLE). NSG was consulted about strategies and aspiration precautions. NSG will give updated for discharge to SNF. No further skilled ST services indicated at this  time. NSG to reconsult if any decline in status while admitted.     HPI HPI: Pt is a 77 y.o. male with a known history of Dementia, asthma, congestive heart failure, COPD, coronary artery disease, diabetes without complication, atrial fibrillation, hyperlipidemia, hypertension, hypothyroidism, sleep apnea-had episode of weakness on left side and confusion 3 days ago while out for shopping.  [facility]-where after MRI and echocardiogram found negative for acute stroke, suspected to have this episode due to hypoglycemia. CT scan of the head was suspicious for a stroke and MRI was done which confirms having acute stroke so hospitalist service was advised to admit for further management. Pt, wife and NSG are denying any ongoing difficulty w/ swallowing; speech is at his baseline. Pt does have Cognitive decline and per chart notes was seen on an Outpatient basis when son reports that the patient does not initiate activities of daily living or recreational activities.  He is frequently non-compliant for taking his medication and using his CPAP at night. Pt/family were given handout on Living w/ Dementia at that time w/ no further f/u noted.       SLP Plan  All goals met       Recommendations  Diet recommendations: Regular;Thin liquid Liquids provided via: Cup;No straw Medication Administration: Whole meds with puree Supervision: Patient able to self feed;Intermittent supervision to cue for compensatory strategies Compensations: Minimize environmental distractions;Slow rate;Small sips/bites;Lingual sweep for clearance of pocketing;Multiple dry swallows after each bite/sip;Follow solids with liquid Postural Changes and/or Swallow Maneuvers: Seated upright 90 degrees;Upright 30-60 min after meal                General recommendations: (Dietician f/u)  Oral Care Recommendations: Oral care BID;Staff/trained caregiver to provide oral care(assist) Follow up Recommendations:  None(pt appears at his baseline needing assistance w/ ADLs) SLP Visit Diagnosis: Dysphagia, unspecified (R13.10) Plan: All goals met       [place], MS, CCC-SLP , 12/08/2017, 1:52 PM

## 2017-12-08 NOTE — Progress Notes (Signed)
Medications given to pt with interpretor assistance.

## 2017-12-08 NOTE — Discharge Instructions (Signed)
Return to the emergency department immediately for any worsening condition including fever, altered mental status, confusion, skin rash, weakness, numbness, or any other symptoms concerning to you.  Pills to be whole in applesauce No straws

## 2017-12-08 NOTE — Discharge Summary (Signed)
[facility] [place] NAME: Derek Shelton    MR#:  [id number]  DATE OF BIRTH:  01/25/1940  DATE OF ADMISSION:  12/02/2017 ADMITTING PHYSICIAN: Vaughan Basta, MD  DATE OF DISCHARGE: 12/08/2017  PRIMARY CARE PHYSICIAN: Perrin Maltese, MD   ADMISSION DIAGNOSIS:  Arthritis of left knee [M17.12] Fall, initial encounter [W19.XXXA] Acute cerebrovascular accident (CVA) ([place]) [I63.9] Acute ischemic stroke ([place]) [I63.9]  DISCHARGE DIAGNOSIS:  Principal Problem:   Acute CVA (cerebrovascular accident) ([place]) Active Problems:   Acute ischemic stroke ([place])   SECONDARY DIAGNOSIS:   Past Medical History:  Diagnosis Date  . Anemia   . Asthma   . CHF (congestive heart failure) ([place])   . COPD (chronic obstructive pulmonary disease) ([place])   . Coronary artery disease   . Dementia ([place])    Per son's report  . Diabetes mellitus without complication ([place])   . Dysrhythmia    Atrial Fibrillation  . GERD (gastroesophageal reflux disease)   . Hyperlipidemia   . Hypertension   . Hypothyroidism   . Shortness of breath dyspnea   . Sleep apnea   . Thyroid disease      ADMITTING HISTORY  HISTORY OF PRESENT ILLNESS: Derek Shelton  is a 77 y.o. male with a known history of asthma, congestive heart failure, COPD, coronary artery disease, dementia, diabetes without complication, atrial fibrillation, hyperlipidemia, hypertension, hypothyroidism, sleep apnea-had episode of weakness on left side and confusion 3 days ago while out for shopping.  [facility]-where after MRI and echocardiogram found negative for acute stroke, suspected to have this episode due to hypoglycemia and sent home. Patient already takes Eliquis for his atrial fibrillation. He and family claims not missing any dose on that. Next day morning, which was 2 days ago he again have weakness which is now mainly on his left side, and he has significant imbalance while  walking and had 2 falls since then. Concerned with this he was brought to emergency room today.  He also had complaint of left knee pain.  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 CT scan of the head was suspicious for a stroke and MRI was done which confirms having acute stroke so hospitalist service was advised to admit for further management  HOSPITAL COURSE:   * Acute right internal capsule CVA Carotid duplex is unremarkable.. 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 Patient had some worsening generalized weakness which accentuated his left-sided weakness.  CT scan of the head and MRI were repeated and showed the same stroke. Statin  *COPD exacerbation On steroids and nebs  * Paroxysmal atrial fibrillation with episodes of bradycardia No pacemaker needed. Stopped digoxin initially due to pauses.  Cardiology has restarted at 0.125 mg . Was on 0.25 at home. Metoprolol 100 mg 2 times daily Eliquis Monitor on telemetry.  * Hyperlipidemia.  Lipitor.  * Hypertension Continue metoprolol   * Diabetes Sliding scale coverage and continue home medications.  * Chronic diastolic heart failure Continue Lasix.  No exacerbation symptoms.  * Dementia Stable  Stable for discharge to SNF  CONSULTS OBTAINED:  Treatment Team:  Leotis Pain, MD Dionisio David, MD Isaias Cowman, MD [place], MD  DRUG ALLERGIES:   Allergies  Allergen Reactions  .  Catapres [Clonidine Hcl]   . Coreg [Carvedilol]   . Penicillins     DISCHARGE MEDICATIONS:   Allergies as of 12/08/2017      Reactions   Catapres [clonidine Hcl]    Coreg [carvedilol]    Penicillins       Medication List    TAKE these medications   apixaban 5 MG Tabs tablet Commonly known as:  ELIQUIS Take  1 tablet (5 mg total) by mouth 2 (two) times daily.   atorvastatin 40 MG tablet Commonly known as:  LIPITOR Take 1 tablet (40 mg total) by mouth daily at 6 PM.   benzonatate 100 MG capsule Commonly known as:  TESSALON Take 100 mg by mouth 3 (three) times daily as needed for cough.   budesonide-formoterol 160-4.5 MCG/ACT inhaler Commonly known as:  SYMBICORT Inhale 2 puffs into the lungs 2 (two) times daily.   digoxin 0.125 MG tablet Commonly known as:  LANOXIN Take 1 tablet (0.125 mg total) by mouth daily. What changed:    medication strength  how much to take   donepezil 5 MG tablet Commonly known as:  ARICEPT Take 5 mg by mouth at bedtime.   DULoxetine 60 MG capsule Commonly known as:  CYMBALTA Take 60 mg by mouth daily.   esomeprazole 40 MG capsule Commonly known as:  NEXIUM Take 40 mg by mouth daily at 12 noon.   ferrous sulfate 325 (65 FE) MG EC tablet Take 325 mg by mouth daily.   finasteride 5 MG tablet Commonly known as:  PROSCAR Take 1 tablet (5 mg total) by mouth daily.   furosemide 20 MG tablet Commonly known as:  LASIX Take 20 mg by mouth 2 (two) times daily.   glimepiride 2 MG tablet Commonly known as:  AMARYL Take 3 mg by mouth 2 (two) times daily. What changed:  Another medication with the same name was removed. Continue taking this medication, and follow the directions you see here.   guaiFENesin-dextromethorphan 100-10 MG/5ML syrup Commonly known as:  ROBITUSSIN DM Take 5 mLs by mouth every 4 (four) hours as needed for cough.   ipratropium-albuterol 0.5-2.5 (3) MG/3ML Soln Commonly known as:  DUONEB Inhale 3 mLs into the lungs every 6 (six) hours as needed for shortness of breath.   levothyroxine 112 MCG tablet Commonly known as:  SYNTHROID, LEVOTHROID Take 112 mcg by mouth daily before breakfast.   Melatonin 3 MG Tabs Take 3 mg by mouth at bedtime as needed (sleep).   metoprolol tartrate 100 MG tablet Commonly known as:   LOPRESSOR Take 1 tablet (100 mg total) by mouth 2 (two) times daily.   montelukast 10 MG tablet Commonly known as:  SINGULAIR Take 10 mg by mouth daily.   omega-3 acid ethyl esters 1 g capsule Commonly known as:  LOVAZA Take 2 g by mouth 2 (two) times daily.   potassium chloride SA 20 MEQ tablet Commonly known as:  K-DUR,KLOR-CON Take 40 mEq by mouth See admin instructions. Take 2 tablets (40MEQ) by mouth twice daily for 3 days as directed   TRESIBA FLEXTOUCH 100 UNIT/ML Sopn FlexTouch Pen Generic drug:  insulin degludec Inject 15 Units into the skin at bedtime.       Today   VITAL SIGNS:  Blood pressure 119/79, pulse 68, temperature 98.4 F (36.9 C), resp. rate 20, height 5\' 4"  (1.626 m), weight 86.2 kg, SpO2 92 %.  I/O:    Intake/Output Summary (Last 24 hours) at 12/08/2017 1214 Last data filed at 12/08/2017  0958 Gross per 24 hour  Intake 720 ml  Output 200 ml  Net 520 ml    PHYSICAL EXAMINATION:  Physical Exam  GENERAL:  77 y.o.-year-old patient lying in the bed with no acute distress.  LUNGS: Normal breath sounds bilaterally, no wheezing, rales,rhonchi or crepitation. No use of accessory muscles of respiration.  CARDIOVASCULAR: S1, S2 normal. No murmurs, rubs, or gallops.  ABDOMEN: Soft, non-tender, non-distended. Bowel sounds present. No organomegaly or mass.  NEUROLOGIC: Moves all 4 extremities. PSYCHIATRIC: The patient is alert and awake SKIN: No obvious rash, lesion, or ulcer.   DATA REVIEW:   CBC Recent Labs  Lab 12/03/17 0940  WBC 7.6  HGB 15.1  HCT 45.1  PLT 185    Chemistries  Recent Labs  Lab 12/05/17 1153  NA 134*  K 3.5  CL 96*  CO2 28  GLUCOSE 234*  BUN 15  CREATININE 1.03  CALCIUM 8.8*    Cardiac Enzymes Recent Labs  Lab 12/02/17 1214  TROPONINI <0.03    Microbiology Results  Results for orders placed or performed during the hospital encounter of 02/21/17  C difficile quick scan w PCR reflex     Status: None    Collection Time: 02/21/17  8:02 AM  Result Value Ref Range Status   C Diff antigen NEGATIVE NEGATIVE Final   C Diff toxin NEGATIVE NEGATIVE Final   C Diff interpretation No C. difficile detected.  Final    Comment: Performed at [facility], [address] [place], [place] [address]  Gastrointestinal Panel by PCR , Stool     Status: None   Collection Time: 02/21/17  8:02 AM  Result Value Ref Range Status   Campylobacter species NOT DETECTED NOT DETECTED Final   Plesimonas shigelloides NOT DETECTED NOT DETECTED Final   Salmonella species NOT DETECTED NOT DETECTED Final   Yersinia enterocolitica NOT DETECTED NOT DETECTED Final   Vibrio species NOT DETECTED NOT DETECTED Final   Vibrio cholerae NOT DETECTED NOT DETECTED Final   Enteroaggregative E coli (EAEC) NOT DETECTED NOT DETECTED Final   Enteropathogenic E coli (EPEC) NOT DETECTED NOT DETECTED Final   Enterotoxigenic E coli (ETEC) NOT DETECTED NOT DETECTED Final   Shiga like toxin producing E coli (STEC) NOT DETECTED NOT DETECTED Final   Shigella/Enteroinvasive E coli (EIEC) NOT DETECTED NOT DETECTED Final   Cryptosporidium NOT DETECTED NOT DETECTED Final   Cyclospora cayetanensis NOT DETECTED NOT DETECTED Final   Entamoeba histolytica NOT DETECTED NOT DETECTED Final   Giardia lamblia NOT DETECTED NOT DETECTED Final   Adenovirus F40/41 NOT DETECTED NOT DETECTED Final   Astrovirus NOT DETECTED NOT DETECTED Final   Norovirus GI/GII NOT DETECTED NOT DETECTED Final   Rotavirus A NOT DETECTED NOT DETECTED Final   Sapovirus (I, II, IV, and V) NOT DETECTED NOT DETECTED Final    Comment: Performed at [facility], [address] [place], [place] [address]    RADIOLOGY:  Mr Herby Abraham Contrast  Result Date: 12/06/2017 CLINICAL DATA:  Stroke follow-up. Left-sided weakness and multiple falls. EXAM: MRI HEAD WITHOUT CONTRAST TECHNIQUE: Multiplanar, multiecho pulse sequences of the brain and surrounding structures were  obtained without intravenous contrast. COMPARISON:  Head CT 12/05/2017 FINDINGS: BRAIN: Abnormal diffusion restriction along the posterior limb of the right internal capsule. No other diffusion abnormality. There is an extra-axial mass of the left middle cranial fossa that measures 2.7 x 2.6 x 3.0 cm. There is no signal abnormality of the underlying brain. The mass is  in close proximity to the left orbital apex and the left foramen ovale. There is also likely extension into the left sphenopalatine fossa. Early confluent hyperintense T2-weighted signal of the periventricular and deep white matter, most commonly due to chronic ischemic microangiopathy. Generalized atrophy without lobar predilection. Susceptibility-sensitive sequences show no chronic microhemorrhage or superficial siderosis. VASCULAR: Major intracranial arterial and venous sinus flow voids are normal. SKULL AND UPPER CERVICAL SPINE: Calvarial bone marrow signal is normal. There is no skull base mass. Visualized upper cervical spine and soft tissues are normal. SINUSES/ORBITS: No fluid levels or advanced mucosal thickening. No mastoid or middle ear effusion. The orbits are normal. IMPRESSION: 1. Acute infarct of the posterior limb of the right internal capsule. No hemorrhage or mass effect. 2. 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 This could be better evaluated with dedicated skull base MRI with and without contrast, if there is clinical evidence of cranial neuropathy. 3. No signal abnormality of the brain adjacent to the meningioma. 4. Atrophy and chronic ischemic microangiopathy. Electronically Signed   By: Ulyses Jarred M.D.   On: 12/06/2017 15:07    Follow up with PCP in 1 week.  Management plans discussed with the patient, family and they are in agreement.  CODE STATUS:     Code Status Orders  (From admission, onward)         Start     Ordered   12/02/17 1723  Full code  Continuous      12/02/17 1723        Code Status History    Date Active Date Inactive Code Status Order ID Comments User Context   12/02/2017 1723 12/02/2017 1723 Full Code [id number]  Vaughan Basta, MD Inpatient   05/22/2017 0844 05/23/2017 1545 Full Code [id number]  [name], MD Inpatient   02/21/2017 0413 02/22/2017 2111 Full Code [id number]  Harrie Foreman, MD ED   02/08/2017 0615 02/11/2017 1655 Full Code [id number]  Harrie Foreman, MD Inpatient   08/06/2014 1821 08/12/2014 2019 Full Code [id number]  Aldean Jewett, MD Inpatient      TOTAL TIME TAKING CARE OF THIS PATIENT ON DAY OF DISCHARGE: more than 30 minutes.   Neita Carp M.D on 12/08/2017 at 12:14 PM  Between 7am to 6pm - Pager - [phone]  After 6pm go to [email or web address] - password [email or web address] [facility]  Office  [phone]  CC: Primary care physician; Perrin Maltese, MD  Note: This dictation was prepared with Dragon dictation along with smaller phrase technology. Any transcriptional errors that result from this process are unintentional.

## 2017-12-08 NOTE — Plan of Care (Signed)
  Problem: Safety: Goal: Ability to remain free from injury will improve Outcome: Progressing   Problem: Education: Goal: Knowledge of secondary prevention will improve Outcome: Progressing   Problem: Nutrition: Goal: Risk of aspiration will decrease Outcome: Progressing   Problem: Ischemic Stroke/TIA Tissue Perfusion: Goal: Complications of ischemic stroke/TIA will be minimized Outcome: Progressing

## 2017-12-08 NOTE — Progress Notes (Signed)
SUBJECTIVE: Pt is feeling much better.   Vitals:   12/08/17 0519 12/08/17 0559 12/08/17 0735 12/08/17 0736  BP: 102/80 108/77  119/79  Pulse: 66 76  68  Resp:    20  Temp:      TempSrc:      SpO2:   92% 92%  Weight:      Height:        Intake/Output Summary (Last 24 hours) at 12/08/2017 0905 Last data filed at 12/08/2017 0806 Gross per 24 hour  Intake 600 ml  Output 300 ml  Net 300 ml    LABS: Basic Metabolic Panel: Recent Labs    12/05/17 1153  NA 134*  K 3.5  CL 96*  CO2 28  GLUCOSE 234*  BUN 15  CREATININE 1.03  CALCIUM 8.8*   Liver Function Tests: No results for input(s): AST, ALT, ALKPHOS, BILITOT, PROT, ALBUMIN in the last 72 hours. No results for input(s): LIPASE, AMYLASE in the last 72 hours. CBC: No results for input(s): WBC, NEUTROABS, HGB, HCT, MCV, PLT in the last 72 hours. Cardiac Enzymes: No results for input(s): CKTOTAL, CKMB, CKMBINDEX, TROPONINI in the last 72 hours. BNP: Invalid input(s): POCBNP D-Dimer: No results for input(s): DDIMER in the last 72 hours. Hemoglobin A1C: No results for input(s): HGBA1C in the last 72 hours. Fasting Lipid Panel: No results for input(s): CHOL, HDL, LDLCALC, TRIG, CHOLHDL, LDLDIRECT in the last 72 hours. Thyroid Function Tests: Recent Labs    12/05/17 1153  TSH 2.721   Anemia Panel: No results for input(s): VITAMINB12, FOLATE, FERRITIN, TIBC, IRON, RETICCTPCT in the last 72 hours.   PHYSICAL EXAM General: Well developed, well nourished, in no acute distress HEENT:  Normocephalic and atramatic Neck:  No JVD.  Lungs: Clear bilaterally to auscultation and percussion. Heart: HRRR . Normal S1 and S2 without gallops or murmurs.  Abdomen: Bowel sounds are positive, abdomen soft and non-tender  Msk: Still has residual left sided weakness. Extremities: No clubbing, cyanosis or edema.   Neuro: Alert and oriented X 3. Psych:  Good affect, responds appropriately  TELEMETRY: Afib ventricular rate  75-90bpm  ASSESSMENT AND PLAN: Acute CVA due to nonhemorrhagic stroke and pauses on telemetry: Rhythm remains in atrial fibrillation, now rate controlled with metoprolol and digoxin. Advise reducing metoprolol to 100mg  BID and continue digoxin 125mg . Cleared for discharge to rehab.  Advise close outpatient follow up with Dr. Humphrey Rolls, Monday 11am.   Continue Eliquis. Dr. Humphrey Rolls discussed with family and they do not want pacemaker.  Principal Problem:   Acute CVA (cerebrovascular accident) [facility]) Active Problems:   Acute ischemic stroke ([place])    Derek Bathe, NP-C 12/08/2017 9:05 AM Cell: [phone]

## 2018-05-28 ENCOUNTER — Ambulatory Visit: Payer: Medicare Other | Admitting: Urology

## 2018-06-19 ENCOUNTER — Ambulatory Visit: Payer: Medicare Other | Attending: Internal Medicine | Admitting: Physical Therapy

## 2018-06-19 ENCOUNTER — Other Ambulatory Visit: Payer: Self-pay

## 2018-06-19 ENCOUNTER — Encounter: Payer: Self-pay | Admitting: Physical Therapy

## 2018-06-19 DIAGNOSIS — M25561 Pain in right knee: Secondary | ICD-10-CM | POA: Diagnosis present

## 2018-06-19 DIAGNOSIS — G8929 Other chronic pain: Secondary | ICD-10-CM | POA: Diagnosis present

## 2018-06-19 DIAGNOSIS — M25562 Pain in left knee: Secondary | ICD-10-CM | POA: Insufficient documentation

## 2018-06-19 DIAGNOSIS — R262 Difficulty in walking, not elsewhere classified: Secondary | ICD-10-CM | POA: Diagnosis present

## 2018-06-19 NOTE — Therapy (Signed)
[facility] [address], [place], [address] Phone: [phone]   Fax:  [phone]  Physical Therapy Evaluation  Patient Details  Name: Derek Shelton MRN: [id number] Date of Birth: 1940-07-23 Referring Provider (PT): Dorthula Perfect Date: 06/19/2018  PT End of Session - 06/20/18 1632    Visit Number  1    Number of Visits  17    Date for PT Re-Evaluation  08/15/18    PT Start Time  0440    PT Stop Time  0534    PT Time Calculation (min)  54 min    Activity Tolerance  Patient limited by fatigue;Patient limited by pain    Behavior During Therapy  Flat affect       Past Medical History:  Diagnosis Date  . Anemia   . Asthma   . CHF (congestive heart failure) ([place])   . COPD (chronic obstructive pulmonary disease) ([place])   . Coronary artery disease   . Dementia ([place])    Per son's report  . Diabetes mellitus without complication ([place])   . Dysrhythmia    Atrial Fibrillation  . GERD (gastroesophageal reflux disease)   . Hyperlipidemia   . Hypertension   . Hypothyroidism   . Shortness of breath dyspnea   . Sleep apnea   . Thyroid disease     Past Surgical History:  Procedure Laterality Date  . CARDIAC CATHETERIZATION    . COLONOSCOPY N/A 08/10/2014   Procedure: COLONOSCOPY;  Surgeon: Manya Silvas, MD;  Location: [facility] ENDOSCOPY;  Service: Endoscopy;  Laterality: N/A;  . COLONOSCOPY WITH PROPOFOL N/A 04/05/2016   Procedure: COLONOSCOPY WITH PROPOFOL;  Surgeon: Lollie Sails, MD;  Location: [facility] ENDOSCOPY;  Service: Endoscopy;  Laterality: N/A;  . ESOPHAGOGASTRODUODENOSCOPY N/A 08/08/2014   Procedure: ESOPHAGOGASTRODUODENOSCOPY (EGD);  Surgeon: Manya Silvas, MD;  Location: [facility] ENDOSCOPY;  Service: Endoscopy;  Laterality: N/A;  plan for early afternoon  . EYE SURGERY    . GIVENS CAPSULE STUDY N/A 08/12/2014   Procedure: GIVENS CAPSULE STUDY;  Surgeon: Manya Silvas, MD;  Location: [facility]  ENDOSCOPY;  Service: Endoscopy;  Laterality: N/A;  . rectal fistula N/A     There were no vitals filed for this visit.    OBJECTIVE  MUSCULOSKELETAL: Tremor: Absent Bulk: Normal Tone: Normal, no spasticity, rigidity, or clonus No trophic changes noted to lower extremities. No ecchymosis, erythema, or edema noted around knee. No gross knee deformity noted  Posture Forward head, rounded shoulders,increased lumbar lordosis and thoracic kyphosis  Lumbar/Hip Unable to truly assess lumbar motion, d/t patient unwillingness, but WNL for assessment completely   Gait Step to with RLE, decreased RLE step lenth, decreased LLE stance time, shuffle pattern, decreased cadence, over 50m unable/unwilling to ambulate further  Strength R/L 4+/4+ Hip flexion 3/3 Hip external rotation 3/3 Hip internal rotation Hip extension patient unable to lay prone, can tell is decreased d/t lack of full ext with STS 4/4 Hip abduction 4/4 Hip adduction 3+*/3+* Knee extension 5/5 Knee flexion 4+/4+ Ankle Dorsiflexion *indicates pain  All knee/ankle motions WNL AROM and PROM  Passive Accessory Motion Superior Tibiofibular Joint: WNL Knee:  wnl Patella: wnl   Ankle: wnl  NEUROLOGICAL:  Mental Status Patient is oriented to person, place- not time Recent memory is impaired Remote memory is intact.  Attention span and concentration are intact.  Expressive speech is difficult to assess as patient does not speak often, wife answers post questions Patient's fund  of knowledge is within normal limits for educational level.  Sensation Grossly intact to light touch bilateral LEs as determined by testing dermatomes L2-S2 Proprioception and hot/cold testing deferred on this date  Functional Movement 5xSTS 18sec 10MWT patient able to ambulate 45ft in 36sec with RW before ceasing d/t fatigue/knee pain Stairs: 2 steps with bilat handrails, CGA for safety, unable to safely continue d/t weakness and pain in  knees STS: patient able to complete without full hip ext for full stand, able to correct with PT cuing with increased fatigue Transfers:  Chair <> bed/chair: patient able to complete modI with UE on surfaces and increased time STS patient able to complete modI with RW Supine > sit: minA with UE and excess time needed Sit > supine: modI excessive time needed Rolling: able to roll side <> back <> side modI with increased time needed, unable to roll onto stomach Scoot: patient able to scoot in supine and sitting IND  Ther-Ex -STS x10 with cuing for full hip ext - Seated abd RTB x10 with cuing for eccentric control - Seated hip flex x10 with cuing for upright posture to prevent compensation Education on purpose of therex for strengthening and endurance, and timeframe/consistency needed for true gains and progress to be made. Patient resistant at first about completing HEP, but verbalizes understanding by the end of conversation.                       Objective measurements completed on examination: See above findings.              PT Education - 06/20/18 1631    Education Details  Patient was educated on diagnosis, anatomy and pathology involved, prognosis, role of PT, and was given an HEP, demonstrating exercise with proper form following verbal and tactile cues, and was given a paper hand out to continue exercise at home. Pt was educated on and agreed to plan of care.    Person(s) Educated  Patient;Spouse    Methods  Explanation;Demonstration;Tactile cues;Verbal cues;Handout    Comprehension  Verbal cues required;Returned demonstration;Tactile cues required;Verbalized understanding;Need further instruction       PT Short Term Goals - 06/20/18 1653      PT SHORT TERM GOAL #2   Title  Patient will be able to ascend/descend 2 steps without handrail with reciprocol gait, IND,  to demonstrate PLOF and safety entering/exiting home    Baseline  06/19/18 able to  ascend 2steps with bilat handrails CGA for safety    Time  4    Period  Weeks    Status  New      PT SHORT TERM GOAL #3   Title  Patient will be able to ambulate 47ft with LRD to demonstrate safety with ambulation over community distances    Baseline   06/19/18 30ft with CGA for safety    Time  4    Period  Weeks    Status  New      PT SHORT TERM GOAL #4   Title  Pt will increase 10MWT to 0.35m/s in order to demonstrate clinically significant improvement in household ambulation.     Baseline  06/19/18 unable to ambulate 66m    Time  4    Period  Weeks    Status  New      PT SHORT TERM GOAL #5   Title   Pt will decrease 5TSTS by at least 3 seconds in order to demonstrate clinically significant improvement  in LE strength    Baseline   06/19/18 18sec    Time  8    Period  Weeks    Status  New        PT Long Term Goals - 06/20/18 1643      PT LONG TERM GOAL #1   Title  Pt will increase LEFS by at least 9 points in order to demonstrate significant improvement in lower extremity function.     Baseline  06/19/18 7/80    Time  8    Period  Weeks    Status  New      PT LONG TERM GOAL #2   Title  Pt will decrease 5TSTS to 10sec to demonstrate normal LE strength    Baseline  06/19/18 18sec    Time  8    Period  Weeks    Status  New      PT LONG TERM GOAL #3   Title  Pt will decrease worst pain as reported on NPRS by at least 3 points in order to demonstrate clinically significant reduction in ankle/foot pain.     Baseline  06/19/18 8/10    Time  8    Period  Weeks    Status  New      PT LONG TERM GOAL #4   Title  Pt will increase 10MWT to 1.44m/s in order to demonstrate clinically significant improvement in community ambulation.     Baseline  06/19/18 unable to ambulate 70m    Time  8    Period  Weeks    Status  New      PT LONG TERM GOAL #5   Title  Patient will be able to ascend/descend 4 steps without handrail with reciprocol gait, IND, to demonstrate PLOF and safety  entering/exiting home    Baseline  06/19/18 able to ascend 2steps with bilat handrails CGA for safety    Time  8    Period  Weeks    Status  New      Additional Long Term Goals   Additional Long Term Goals  Yes      PT LONG TERM GOAL #6   Title  Patient will be able to ambulate 260ft with LRD to demonstrate safety with ambulation over community distances    Baseline  06/19/18 52ft with CGA for safety    Time  8    Period  Weeks    Status  New             Plan - 06/20/18 1633    Clinical Impression Statement  Patient is a 78 year old male presenting with bilat knee pain. Impairments in core and LE weakness LLE>RLE, BLE and trunk ROM, motor control, muscle endurance, cardiorespiratory endurance, balance, and pain. Patient currently limited in standing, ambulating, stair negotiation, reaching, lifting, transferring; limiting participation in self care ADLs, and entering and exiting home. Wife and patient report he was ambulatory without AD prior to ceasing HHPT after CVA last fall, and reports this is a big change in independence and ambulatory status. Would benefit from skilled PT to address above deficits and promote optimal return to PLOF    Personal Factors and Comorbidities  Age;Comorbidity 1;Comorbidity 2;Comorbidity 3+;Fitness;Past/Current Experience;Time since onset of injury/illness/exacerbation    Comorbidities  Dementia, CVA, COPD, CHF, DM2, HTN, HL, Afib, asthma    Examination-Activity Limitations  Bathing;Bed Mobility;Dressing;Stairs;Bend;Lift;Locomotion Level;Squat;Carry;Transfers;[place];Interpersonal Relationship;Yard Work;Cleaning;Laundry;Community Activity    Stability/Clinical Decision Making  Evolving/Moderate complexity    Clinical Decision Making  Moderate    Rehab Potential  Fair    PT Frequency  2x / week    PT Duration  8 weeks    PT Treatment/Interventions  ADLs/Self Care Home Management;Cryotherapy;Gait  training;Therapeutic exercise;Patient/family education;Manual techniques;Passive range of motion;Joint Manipulations;Spinal Manipulations;Wheelchair mobility training;Neuromuscular re-education;Balance training;Functional mobility training;Electrical Stimulation;Moist Heat;DME Instruction;Therapeutic activities    PT Next Visit Plan  gait training, HEP review    PT Home Exercise Plan  STS, seated marching, seated abd RTB    Consulted and Agree with Plan of Care  Patient       Patient will benefit from skilled therapeutic intervention in order to improve the following deficits and impairments:  Abnormal gait, Decreased balance, Decreased endurance, Decreased mobility, Difficulty walking, Cardiopulmonary status limiting activity, Decreased activity tolerance, Decreased coordination, Decreased strength, Decreased safety awareness, Decreased range of motion, Improper body mechanics, Obesity, Impaired flexibility, Increased fascial restricitons, Postural dysfunction, Pain  Visit Diagnosis: Chronic pain of left knee  Chronic pain of right knee  Difficulty in walking, not elsewhere classified     Problem List Patient Active Problem List   Diagnosis Date Noted  . Acute CVA (cerebrovascular accident) ([place]) 12/02/2017  . Acute ischemic stroke ([place]) 12/02/2017  . A-fib ([place]) 05/22/2017  . Chest pain 02/21/2017  . AKI (acute kidney injury) ([place]) 02/21/2017  . SOB (shortness of breath) 02/08/2017  . Atrial fibrillation with RVR ([place]) 02/08/2017  . GI bleeding 08/06/2014  . Anemia 08/06/2014  . Bradycardia 08/06/2014  . Atrial fibrillation ([place]) 08/06/2014  . Hyponatremia 08/06/2014  . Chronic diastolic heart failure ([place]) 08/06/2014  . Diabetes ([place]) 08/06/2014  . OSA (obstructive sleep apnea) 08/06/2014   Shelton Silvas PT, DPT Shelton Silvas 06/20/2018, 5:08 PM  [facility] [address] [place], [address] Phone:  [phone]   Fax:  [phone]  Name: Derek Shelton MRN: [id number] Date of Birth: [birth year]

## 2018-06-25 ENCOUNTER — Other Ambulatory Visit: Payer: Self-pay

## 2018-06-25 ENCOUNTER — Encounter: Payer: Self-pay | Admitting: Physical Therapy

## 2018-06-25 ENCOUNTER — Ambulatory Visit: Payer: Medicare Other | Attending: Internal Medicine | Admitting: Physical Therapy

## 2018-06-25 DIAGNOSIS — R262 Difficulty in walking, not elsewhere classified: Secondary | ICD-10-CM | POA: Diagnosis present

## 2018-06-25 DIAGNOSIS — M25562 Pain in left knee: Secondary | ICD-10-CM | POA: Insufficient documentation

## 2018-06-25 DIAGNOSIS — M25561 Pain in right knee: Secondary | ICD-10-CM | POA: Insufficient documentation

## 2018-06-25 DIAGNOSIS — G8929 Other chronic pain: Secondary | ICD-10-CM | POA: Diagnosis present

## 2018-06-25 NOTE — Therapy (Signed)
[facility] [address] [place], [address] Phone: [phone]   Fax:  [phone]  Physical Therapy Treatment  Patient Details  Name: Derek Shelton MRN: [id number] Date of Birth: 07/11/1940 Referring Provider (PT): Dorthula Perfect Date: 06/25/2018  PT End of Session - 06/25/18 1358    Visit Number  2    Number of Visits  17    Date for PT Re-Evaluation  08/15/18    PT Start Time  0130    PT Stop Time  0215    PT Time Calculation (min)  45 min    Activity Tolerance  Patient limited by fatigue;Patient limited by pain    Behavior During Therapy  Flat affect       Past Medical History:  Diagnosis Date  . Anemia   . Asthma   . CHF (congestive heart failure) ([place])   . COPD (chronic obstructive pulmonary disease) ([place])   . Coronary artery disease   . Dementia ([place])    Per son's report  . Diabetes mellitus without complication ([place])   . Dysrhythmia    Atrial Fibrillation  . GERD (gastroesophageal reflux disease)   . Hyperlipidemia   . Hypertension   . Hypothyroidism   . Shortness of breath dyspnea   . Sleep apnea   . Thyroid disease     Past Surgical History:  Procedure Laterality Date  . CARDIAC CATHETERIZATION    . COLONOSCOPY N/A 08/10/2014   Procedure: COLONOSCOPY;  Surgeon: Manya Silvas, MD;  Location: [facility] ENDOSCOPY;  Service: Endoscopy;  Laterality: N/A;  . COLONOSCOPY WITH PROPOFOL N/A 04/05/2016   Procedure: COLONOSCOPY WITH PROPOFOL;  Surgeon: Lollie Sails, MD;  Location: [facility] ENDOSCOPY;  Service: Endoscopy;  Laterality: N/A;  . ESOPHAGOGASTRODUODENOSCOPY N/A 08/08/2014   Procedure: ESOPHAGOGASTRODUODENOSCOPY (EGD);  Surgeon: Manya Silvas, MD;  Location: [facility] ENDOSCOPY;  Service: Endoscopy;  Laterality: N/A;  plan for early afternoon  . EYE SURGERY    . GIVENS CAPSULE STUDY N/A 08/12/2014   Procedure: GIVENS CAPSULE STUDY;  Surgeon: Manya Silvas, MD;  Location: [facility]  ENDOSCOPY;  Service: Endoscopy;  Laterality: N/A;  . rectal fistula N/A     There were no vitals filed for this visit.  Subjective Assessment - 06/25/18 1333    Subjective  Pt reports he is doing well today, but has not completed any of his HEP. Patient not accompanied by wife this date.     Pertinent History  Interpreter used and wife with patient to give history d/t cognitive impairments (dementia). Patient presenting with bilat knee pain. Pertinent history of CVA 09/2017 with associated \ L sided weakness, has been using WC in community and RW in home since, though wife reports he does not like to use the RW (ambulatory prior). Pt reports bilat knee pain that has been going on for some time with exacerbation recently since he has not been having HH (since Dec). Wife reports no pain at rest, only knee pain with walking/standing. Son helps patient with bathing and dressing, as he cannot stand for longer than 64mins without falling or having pain. Is able to complete bed mobility ind, and chair <> chair transfers IND, and propel his own WC. Patient has stairs to enter and exit home and wife reports his 2 sons carry him in his WC up and down steps. Patient is continent, but uses urinal d/t decreased motivation, and adult diaper to prevent accidents. Reports highest pain level  7/10 over the past week, and best 0/10 with rest.     Limitations  Walking;Lifting;House hold activities;Standing    How long can you sit comfortably?  unlimited    How long can you stand comfortably?  41mins     How long can you walk comfortably?  49mins    Diagnostic tests  None    Pain Onset  More than a month ago       Ther-Ex - STS  3x 10/8/6 with demo and cuing for proper form with glute max activation and to prevent "knees over toes" - Seated marching alt3x 10 each LE with cuing for upright posture, eccentric control and TC for proper ROM - Seated knee ext 3# AW x10; 5# AW 2x 10/8 with cuing for posture and eccentric  control with good carry over following  Gait Training Over 171ft ambulation with 3 rest breaks; 83min; PT cuing patient for RW safety (stay inside RW), which he is able to comply with. PT cued patient for step through pattern, as he has decreased LLE stance time with RLE step to, which he is able to correct with cuing until he begins to get fatigued. PT educated patient on importance of foot clearance to prevent falls, which he verbalized understanding of                          PT Education - 06/25/18 1358    Education Details  gait training, therex form    Person(s) Educated  Patient    Methods  Explanation;Demonstration;Tactile cues;Verbal cues    Comprehension  Verbal cues required;Returned demonstration;Verbalized understanding;Tactile cues required       PT Short Term Goals - 06/20/18 1653      PT SHORT TERM GOAL #2   Title  Patient will be able to ascend/descend 2 steps without handrail with reciprocol gait, IND,  to demonstrate PLOF and safety entering/exiting home    Baseline  06/19/18 able to ascend 2steps with bilat handrails CGA for safety    Time  4    Period  Weeks    Status  New      PT SHORT TERM GOAL #3   Title  Patient will be able to ambulate 8ft with LRD to demonstrate safety with ambulation over community distances    Baseline   06/19/18 65ft with CGA for safety    Time  4    Period  Weeks    Status  New      PT SHORT TERM GOAL #4   Title  Pt will increase 10MWT to 0.26m/s in order to demonstrate clinically significant improvement in household ambulation.     Baseline  06/19/18 unable to ambulate 55m    Time  4    Period  Weeks    Status  New      PT SHORT TERM GOAL #5   Title   Pt will decrease 5TSTS by at least 3 seconds in order to demonstrate clinically significant improvement in LE strength    Baseline   06/19/18 18sec    Time  8    Period  Weeks    Status  New        PT Long Term Goals - 06/20/18 1643      PT LONG TERM  GOAL #1   Title  Pt will increase LEFS by at least 9 points in order to demonstrate significant improvement in lower extremity function.  Baseline  06/19/18 7/80    Time  8    Period  Weeks    Status  New      PT LONG TERM GOAL #2   Title  Pt will decrease 5TSTS to 10sec to demonstrate normal LE strength    Baseline  06/19/18 18sec    Time  8    Period  Weeks    Status  New      PT LONG TERM GOAL #3   Title  Pt will decrease worst pain as reported on NPRS by at least 3 points in order to demonstrate clinically significant reduction in ankle/foot pain.     Baseline  06/19/18 8/10    Time  8    Period  Weeks    Status  New      PT LONG TERM GOAL #4   Title  Pt will increase 10MWT to 1.106m/s in order to demonstrate clinically significant improvement in community ambulation.     Baseline  06/19/18 unable to ambulate 22m    Time  8    Period  Weeks    Status  New      PT LONG TERM GOAL #5   Title  Patient will be able to ascend/descend 4 steps without handrail with reciprocol gait, IND, to demonstrate PLOF and safety entering/exiting home    Baseline  06/19/18 able to ascend 2steps with bilat handrails CGA for safety    Time  8    Period  Weeks    Status  New      Additional Long Term Goals   Additional Long Term Goals  Yes      PT LONG TERM GOAL #6   Title  Patient will be able to ambulate 268ft with LRD to demonstrate safety with ambulation over community distances    Baseline  06/19/18 40ft with CGA for safety    Time  8    Period  Weeks    Status  New            Plan - 06/25/18 1443    Clinical Impression Statement  PT led patient through gait training with RW for normalized gait pattern and safe RW negotiation, which patient is able to comply wit successfully. PT utilized therex for LE strengthening, which patient is able to complete, needing cuing for proper form and muscle activation. PT will continue progression as able.     Personal Factors and Comorbidities   Age;Comorbidity 1;Comorbidity 2;Comorbidity 3+;Fitness;Past/Current Experience;Time since onset of injury/illness/exacerbation    Comorbidities  Dementia, CVA, COPD, CHF, DM2, HTN, HL, Afib, asthma    Examination-Activity Limitations  Bathing;Bed Mobility;Dressing;Stairs;Bend;Lift;Locomotion Level;Squat;Carry;Transfers;[place];Interpersonal Relationship;Yard Work;Cleaning;Laundry;Community Activity    Stability/Clinical Decision Making  Evolving/Moderate complexity    Rehab Potential  Fair    PT Frequency  2x / week    PT Duration  8 weeks    PT Treatment/Interventions  ADLs/Self Care Home Management;Cryotherapy;Gait training;Therapeutic exercise;Patient/family education;Manual techniques;Passive range of motion;Joint Manipulations;Spinal Manipulations;Wheelchair mobility training;Neuromuscular re-education;Balance training;Functional mobility training;Electrical Stimulation;Moist Heat;DME Instruction;Therapeutic activities    PT Next Visit Plan  gait training, HEP review    PT Home Exercise Plan  STS, seated marching, seated abd RTB    Consulted and Agree with Plan of Care  Patient       Patient will benefit from skilled therapeutic intervention in order to improve the following deficits and impairments:  Abnormal gait, Decreased balance, Decreased endurance, Decreased mobility, Difficulty walking, Cardiopulmonary status  limiting activity, Decreased activity tolerance, Decreased coordination, Decreased strength, Decreased safety awareness, Decreased range of motion, Improper body mechanics, Obesity, Impaired flexibility, Increased fascial restricitons, Postural dysfunction, Pain  Visit Diagnosis: Chronic pain of left knee     Problem List Patient Active Problem List   Diagnosis Date Noted  . Acute CVA (cerebrovascular accident) ([place]) 12/02/2017  . Acute ischemic stroke ([place]) 12/02/2017  . A-fib ([place]) 05/22/2017  . Chest pain 02/21/2017   . AKI (acute kidney injury) ([place]) 02/21/2017  . SOB (shortness of breath) 02/08/2017  . Atrial fibrillation with RVR ([place]) 02/08/2017  . GI bleeding 08/06/2014  . Anemia 08/06/2014  . Bradycardia 08/06/2014  . Atrial fibrillation ([place]) 08/06/2014  . Hyponatremia 08/06/2014  . Chronic diastolic heart failure ([place]) 08/06/2014  . Diabetes ([place]) 08/06/2014  . OSA (obstructive sleep apnea) 08/06/2014   Shelton Silvas PT, DPT Shelton Silvas 06/25/2018, 2:44 PM  [facility] [address], [place], [address] Phone: [phone]   Fax:  [phone]  Name: VIDUR KNUST MRN: [id number] Date of Birth: 1940-04-02

## 2018-06-27 ENCOUNTER — Encounter: Payer: Self-pay | Admitting: Physical Therapy

## 2018-06-27 ENCOUNTER — Ambulatory Visit: Payer: Medicare Other | Admitting: Physical Therapy

## 2018-06-27 ENCOUNTER — Other Ambulatory Visit: Payer: Self-pay

## 2018-06-27 DIAGNOSIS — M25562 Pain in left knee: Secondary | ICD-10-CM

## 2018-06-27 DIAGNOSIS — R262 Difficulty in walking, not elsewhere classified: Secondary | ICD-10-CM

## 2018-06-27 DIAGNOSIS — G8929 Other chronic pain: Secondary | ICD-10-CM

## 2018-06-27 NOTE — Therapy (Signed)
[facility] [address], [place], [address] Phone: [phone]   Fax:  [phone]  Physical Therapy Treatment  Patient Details  Name: Derek Shelton MRN: [id number] Date of Birth: 20-Apr-1940 Referring Provider (PT): Dorthula Perfect Date: 06/27/2018  PT End of Session - 06/27/18 9892    Visit Number  3    Number of Visits  17    Date for PT Re-Evaluation  08/15/18    PT Start Time  0130    PT Stop Time  0215    PT Time Calculation (min)  45 min    Activity Tolerance  Patient limited by fatigue;Patient limited by pain    Behavior During Therapy  Flat affect       Past Medical History:  Diagnosis Date  . Anemia   . Asthma   . CHF (congestive heart failure) ([place])   . COPD (chronic obstructive pulmonary disease) ([place])   . Coronary artery disease   . Dementia ([place])    Per son's report  . Diabetes mellitus without complication ([place])   . Dysrhythmia    Atrial Fibrillation  . GERD (gastroesophageal reflux disease)   . Hyperlipidemia   . Hypertension   . Hypothyroidism   . Shortness of breath dyspnea   . Sleep apnea   . Thyroid disease     Past Surgical History:  Procedure Laterality Date  . CARDIAC CATHETERIZATION    . COLONOSCOPY N/A 08/10/2014   Procedure: COLONOSCOPY;  Surgeon: Manya Silvas, MD;  Location: [facility] ENDOSCOPY;  Service: Endoscopy;  Laterality: N/A;  . COLONOSCOPY WITH PROPOFOL N/A 04/05/2016   Procedure: COLONOSCOPY WITH PROPOFOL;  Surgeon: Lollie Sails, MD;  Location: [facility] ENDOSCOPY;  Service: Endoscopy;  Laterality: N/A;  . ESOPHAGOGASTRODUODENOSCOPY N/A 08/08/2014   Procedure: ESOPHAGOGASTRODUODENOSCOPY (EGD);  Surgeon: Manya Silvas, MD;  Location: [facility] ENDOSCOPY;  Service: Endoscopy;  Laterality: N/A;  plan for early afternoon  . EYE SURGERY    . GIVENS CAPSULE STUDY N/A 08/12/2014   Procedure: GIVENS CAPSULE STUDY;  Surgeon: Manya Silvas, MD;  Location: [facility]  ENDOSCOPY;  Service: Endoscopy;  Laterality: N/A;  . rectal fistula N/A     There were no vitals filed for this visit.  Subjective Assessment - 06/27/18 1340    Subjective  Reports no pain today, and minimal compliance with HEP.     Pertinent History  Interpreter used and wife with patient to give history d/t cognitive impairments (dementia). Patient presenting with bilat knee pain. Pertinent history of CVA 09/2017 with associated \ L sided weakness, has been using WC in community and RW in home since, though wife reports he does not like to use the RW (ambulatory prior). Pt reports bilat knee pain that has been going on for some time with exacerbation recently since he has not been having HH (since Dec). Wife reports no pain at rest, only knee pain with walking/standing. Son helps patient with bathing and dressing, as he cannot stand for longer than 33mins without falling or having pain. Is able to complete bed mobility ind, and chair <> chair transfers IND, and propel his own WC. Patient has stairs to enter and exit home and wife reports his 2 sons carry him in his WC up and down steps. Patient is continent, but uses urinal d/t decreased motivation, and adult diaper to prevent accidents. Reports highest pain level 7/10 over the past week, and best 0/10 with rest.  Limitations  Walking;Lifting;House hold activities;Standing    How long can you sit comfortably?  unlimited    How long can you stand comfortably?  21mins     How long can you walk comfortably?  73mins    Diagnostic tests  None    Pain Onset  More than a month ago       Ther-Ex - STS 3x 6 d/t pt not wanting to complete more reps d/t being tired, encouragement needed to complete sets, and cuing for full stand - Standing hip abd x7 on LLE before patient reporting dizziness, stumbling to sit HR = 74 BP 153/93 PT wheeled patient to lay down with feet up, a couple mins later pt reports chest pain.  After 106mins PT took vitals with BP  138/84, pt continues to report dizziness, denies chest pain After several minutes able to ambulate to car without dizziness Education to pt and son on signs of MI and vitals encouraging physician clearance   Gait Training Patient ambulates into clinic 61ft and his son reports that he felt as though his heart rate was high BP 153/101; 64min later: 132/84 Pulse: 80bpm Over 179ft ambulation with 1 rest breaks after 52ft and 1 rest break at the end of trial; 10min; Good carry over from previous session of step through gait pattern, with decreased R step length over time d/t increase in L knee pain  138/84                       PT Education - 06/27/18 1353    Education Details  gait training, therex form    Person(s) Educated  Patient    Methods  Explanation;Demonstration;Verbal cues    Comprehension  Verbal cues required;Returned demonstration       PT Short Term Goals - 06/20/18 1653      PT SHORT TERM GOAL #2   Title  Patient will be able to ascend/descend 2 steps without handrail with reciprocol gait, IND,  to demonstrate PLOF and safety entering/exiting home    Baseline  06/19/18 able to ascend 2steps with bilat handrails CGA for safety    Time  4    Period  Weeks    Status  New      PT SHORT TERM GOAL #3   Title  Patient will be able to ambulate 53ft with LRD to demonstrate safety with ambulation over community distances    Baseline   06/19/18 46ft with CGA for safety    Time  4    Period  Weeks    Status  New      PT SHORT TERM GOAL #4   Title  Pt will increase 10MWT to 0.73m/s in order to demonstrate clinically significant improvement in household ambulation.     Baseline  06/19/18 unable to ambulate 49m    Time  4    Period  Weeks    Status  New      PT SHORT TERM GOAL #5   Title   Pt will decrease 5TSTS by at least 3 seconds in order to demonstrate clinically significant improvement in LE strength    Baseline   06/19/18 18sec    Time  8    Period   Weeks    Status  New        PT Long Term Goals - 06/20/18 1643      PT LONG TERM GOAL #1   Title  Pt will increase LEFS by at  least 9 points in order to demonstrate significant improvement in lower extremity function.     Baseline  06/19/18 7/80    Time  8    Period  Weeks    Status  New      PT LONG TERM GOAL #2   Title  Pt will decrease 5TSTS to 10sec to demonstrate normal LE strength    Baseline  06/19/18 18sec    Time  8    Period  Weeks    Status  New      PT LONG TERM GOAL #3   Title  Pt will decrease worst pain as reported on NPRS by at least 3 points in order to demonstrate clinically significant reduction in ankle/foot pain.     Baseline  06/19/18 8/10    Time  8    Period  Weeks    Status  New      PT LONG TERM GOAL #4   Title  Pt will increase 10MWT to 1.42m/s in order to demonstrate clinically significant improvement in community ambulation.     Baseline  06/19/18 unable to ambulate 65m    Time  8    Period  Weeks    Status  New      PT LONG TERM GOAL #5   Title  Patient will be able to ascend/descend 4 steps without handrail with reciprocol gait, IND, to demonstrate PLOF and safety entering/exiting home    Baseline  06/19/18 able to ascend 2steps with bilat handrails CGA for safety    Time  8    Period  Weeks    Status  New      Additional Long Term Goals   Additional Long Term Goals  Yes      PT LONG TERM GOAL #6   Title  Patient will be able to ambulate 24ft with LRD to demonstrate safety with ambulation over community distances    Baseline  06/19/18 76ft with CGA for safety    Time  8    Period  Weeks    Status  New            Plan - 06/27/18 1739    Clinical Impression Statement  Patient's vitals limiting progression this visit. Was able to ambulate more in clinic and with least breaks with gait training. Following ambulation and some therex patient has increased BP with complaints of dizziness and chest pain (see therex note). Following supine  with feet propped laying for several minutes monitored by PT symptoms subsided. PT contacted PCP which has no opennings today, but reports they can sees pt tomorrow. PT spoke to pts son to rely this information and encouraged son to take immediate medical attention should chest pain continue, after son declined wanting to take patient to ER. PT educated son on signs and symptoms of a cardiac event and tried to stress the importance of medical attention for these symptoms, but pts son insisted he thought these symptoms had to do with a change to pts BP medication last week. PT will continue to monitor cardiac symptoms and progress therex and ambulation as able.     Personal Factors and Comorbidities  Age;Comorbidity 1;Comorbidity 2;Comorbidity 3+;Fitness;Past/Current Experience;Time since onset of injury/illness/exacerbation    Comorbidities  Dementia, CVA, COPD, CHF, DM2, HTN, HL, Afib, asthma    Examination-Activity Limitations  Bathing;Bed Mobility;Dressing;Stairs;Bend;Lift;Locomotion Level;Squat;Carry;Transfers;[place];Interpersonal Relationship;Yard Work;Cleaning;Laundry;Community Activity    Stability/Clinical Decision Making  Evolving/Moderate complexity    Rehab  Potential  Fair    PT Frequency  2x / week    PT Duration  8 weeks    PT Treatment/Interventions  ADLs/Self Care Home Management;Cryotherapy;Gait training;Therapeutic exercise;Patient/family education;Manual techniques;Passive range of motion;Joint Manipulations;Spinal Manipulations;Wheelchair mobility training;Neuromuscular re-education;Balance training;Functional mobility training;Electrical Stimulation;Moist Heat;DME Instruction;Therapeutic activities    PT Next Visit Plan  gait training, HEP review    PT Home Exercise Plan  STS, seated marching, seated abd RTB    Consulted and Agree with Plan of Care  Patient       Patient will benefit from skilled therapeutic intervention in  order to improve the following deficits and impairments:  Abnormal gait, Decreased balance, Decreased endurance, Decreased mobility, Difficulty walking, Cardiopulmonary status limiting activity, Decreased activity tolerance, Decreased coordination, Decreased strength, Decreased safety awareness, Decreased range of motion, Improper body mechanics, Obesity, Impaired flexibility, Increased fascial restricitons, Postural dysfunction, Pain  Visit Diagnosis: Chronic pain of left knee  Chronic pain of right knee  Difficulty in walking, not elsewhere classified     Problem List Patient Active Problem List   Diagnosis Date Noted  . Acute CVA (cerebrovascular accident) ([place]) 12/02/2017  . Acute ischemic stroke ([place]) 12/02/2017  . A-fib ([place]) 05/22/2017  . Chest pain 02/21/2017  . AKI (acute kidney injury) ([place]) 02/21/2017  . SOB (shortness of breath) 02/08/2017  . Atrial fibrillation with RVR ([place]) 02/08/2017  . GI bleeding 08/06/2014  . Anemia 08/06/2014  . Bradycardia 08/06/2014  . Atrial fibrillation ([place]) 08/06/2014  . Hyponatremia 08/06/2014  . Chronic diastolic heart failure ([place]) 08/06/2014  . Diabetes ([place]) 08/06/2014  . OSA (obstructive sleep apnea) 08/06/2014   Shelton Silvas PT, DPT Shelton Silvas 06/27/2018, 5:44 PM  [facility] [address] [place], [address] Phone: [phone]   Fax:  [phone]  Name: Derek Shelton MRN: [id number] Date of Birth: 07-04-1940

## 2018-07-02 ENCOUNTER — Ambulatory Visit: Payer: Medicare Other | Admitting: Physical Therapy

## 2018-07-02 ENCOUNTER — Other Ambulatory Visit: Payer: Self-pay

## 2018-07-02 ENCOUNTER — Encounter: Payer: Self-pay | Admitting: Physical Therapy

## 2018-07-02 DIAGNOSIS — M25561 Pain in right knee: Secondary | ICD-10-CM

## 2018-07-02 DIAGNOSIS — M25562 Pain in left knee: Secondary | ICD-10-CM | POA: Diagnosis not present

## 2018-07-02 DIAGNOSIS — G8929 Other chronic pain: Secondary | ICD-10-CM

## 2018-07-02 NOTE — Therapy (Signed)
[facility] [address] [place], [address] Phone: [phone]   Fax:  [phone]  Physical Therapy Treatment  Patient Details  Name: Derek Shelton MRN: [id number] Date of Birth: Jun 10, 1940 Referring Provider (PT): Dorthula Perfect Date: 07/02/2018  PT End of Session - 07/02/18 1354    Visit Number  4    Number of Visits  17    Date for PT Re-Evaluation  08/15/18    PT Start Time  0130    PT Stop Time  0215    PT Time Calculation (min)  45 min    Activity Tolerance  Patient limited by fatigue;Patient limited by pain    Behavior During Therapy  Flat affect       Past Medical History:  Diagnosis Date  . Anemia   . Asthma   . CHF (congestive heart failure) ([place])   . COPD (chronic obstructive pulmonary disease) ([place])   . Coronary artery disease   . Dementia ([place])    Per son's report  . Diabetes mellitus without complication ([place])   . Dysrhythmia    Atrial Fibrillation  . GERD (gastroesophageal reflux disease)   . Hyperlipidemia   . Hypertension   . Hypothyroidism   . Shortness of breath dyspnea   . Sleep apnea   . Thyroid disease     Past Surgical History:  Procedure Laterality Date  . CARDIAC CATHETERIZATION    . COLONOSCOPY N/A 08/10/2014   Procedure: COLONOSCOPY;  Surgeon: Manya Silvas, MD;  Location: [facility] ENDOSCOPY;  Service: Endoscopy;  Laterality: N/A;  . COLONOSCOPY WITH PROPOFOL N/A 04/05/2016   Procedure: COLONOSCOPY WITH PROPOFOL;  Surgeon: Lollie Sails, MD;  Location: [facility] ENDOSCOPY;  Service: Endoscopy;  Laterality: N/A;  . ESOPHAGOGASTRODUODENOSCOPY N/A 08/08/2014   Procedure: ESOPHAGOGASTRODUODENOSCOPY (EGD);  Surgeon: Manya Silvas, MD;  Location: [facility] ENDOSCOPY;  Service: Endoscopy;  Laterality: N/A;  plan for early afternoon  . EYE SURGERY    . GIVENS CAPSULE STUDY N/A 08/12/2014   Procedure: GIVENS CAPSULE STUDY;  Surgeon: Manya Silvas, MD;  Location: [facility]  ENDOSCOPY;  Service: Endoscopy;  Laterality: N/A;  . rectal fistula N/A     There were no vitals filed for this visit.  Subjective Assessment - 07/02/18 1330    Subjective  Patient reports no episodes of dizziness since last visit. Son reports they went to his PCP and everything checked out well, thought patient may just be dehydrated. Patient reports no pain to date. Minimal HEP compliance    Pertinent History  Interpreter used and wife with patient to give history d/t cognitive impairments (dementia). Patient presenting with bilat knee pain. Pertinent history of CVA 09/2017 with associated \ L sided weakness, has been using WC in community and RW in home since, though wife reports he does not like to use the RW (ambulatory prior). Pt reports bilat knee pain that has been going on for some time with exacerbation recently since he has not been having HH (since Dec). Wife reports no pain at rest, only knee pain with walking/standing. Son helps patient with bathing and dressing, as he cannot stand for longer than 35mins without falling or having pain. Is able to complete bed mobility ind, and chair <> chair transfers IND, and propel his own WC. Patient has stairs to enter and exit home and wife reports his 2 sons carry him in his WC up and down steps. Patient is continent, but uses urinal  d/t decreased motivation, and adult diaper to prevent accidents. Reports highest pain level 7/10 over the past week, and best 0/10 with rest.     Limitations  Walking;Lifting;House hold activities;Standing    How long can you sit comfortably?  unlimited    How long can you stand comfortably?  58mins     How long can you walk comfortably?  10mins    Diagnostic tests  None    Pain Onset  More than a month ago          Ther-Ex - Seated toe raise 2x 10 with education on purpose of DF in gait for foot clearance - Seated marching alt 2x 10 each LE with cuing for upright posture, eccentric control and TC for proper ROM  with good carry over following - Standing heel taps 4in step (attempted 6in unable) 3x 6 each with bilat UE support - Standing hip abd (b/l UE support) x8 on LLE with heavy cuing for posture, unwilling to try on RLE d/t pain in L knee with supported SLS - Seated hip abd GTB 3x 10 with demo for technique and cuing for eccentric control with good carry over following - Seated knee ext 5# AW 3x 10 with cuing for posture and eccentric control with good carry over following  Gait Training Over 141ft ambulation with 3 rest breaks; 23min; PT cuing patient for  step through pattern, as he has decreased LLE stance time with RLE step to, which he is able to correct with cuing until he begins to get fatigued and L knee pain increases. Reports 7/10 L knee pain following  Following therex, attempted second lap, patient able to complete 59ft before L knee pain and fatigue, following a seated therex patient able to complete subsequent 61ft  All gait training with WC follow as patient is very impulsive when he needs to sit.                       PT Education - 07/02/18 1343    Education Details  gait training, therex form    Person(s) Educated  Patient    Methods  Explanation;Demonstration;Tactile cues;Verbal cues    Comprehension  Verbalized understanding;Returned demonstration;Verbal cues required;Tactile cues required       PT Short Term Goals - 06/20/18 1653      PT SHORT TERM GOAL #2   Title  Patient will be able to ascend/descend 2 steps without handrail with reciprocol gait, IND,  to demonstrate PLOF and safety entering/exiting home    Baseline  06/19/18 able to ascend 2steps with bilat handrails CGA for safety    Time  4    Period  Weeks    Status  New      PT SHORT TERM GOAL #3   Title  Patient will be able to ambulate 23ft with LRD to demonstrate safety with ambulation over community distances    Baseline   06/19/18 94ft with CGA for safety    Time  4    Period  Weeks     Status  New      PT SHORT TERM GOAL #4   Title  Pt will increase 10MWT to 0.45m/s in order to demonstrate clinically significant improvement in household ambulation.     Baseline  06/19/18 unable to ambulate 45m    Time  4    Period  Weeks    Status  New      PT SHORT TERM GOAL #5  Title   Pt will decrease 5TSTS by at least 3 seconds in order to demonstrate clinically significant improvement in LE strength    Baseline   06/19/18 18sec    Time  8    Period  Weeks    Status  New        PT Long Term Goals - 06/20/18 1643      PT LONG TERM GOAL #1   Title  Pt will increase LEFS by at least 9 points in order to demonstrate significant improvement in lower extremity function.     Baseline  06/19/18 7/80    Time  8    Period  Weeks    Status  New      PT LONG TERM GOAL #2   Title  Pt will decrease 5TSTS to 10sec to demonstrate normal LE strength    Baseline  06/19/18 18sec    Time  8    Period  Weeks    Status  New      PT LONG TERM GOAL #3   Title  Pt will decrease worst pain as reported on NPRS by at least 3 points in order to demonstrate clinically significant reduction in ankle/foot pain.     Baseline  06/19/18 8/10    Time  8    Period  Weeks    Status  New      PT LONG TERM GOAL #4   Title  Pt will increase 10MWT to 1.64m/s in order to demonstrate clinically significant improvement in community ambulation.     Baseline  06/19/18 unable to ambulate 66m    Time  8    Period  Weeks    Status  New      PT LONG TERM GOAL #5   Title  Patient will be able to ascend/descend 4 steps without handrail with reciprocol gait, IND, to demonstrate PLOF and safety entering/exiting home    Baseline  06/19/18 able to ascend 2steps with bilat handrails CGA for safety    Time  8    Period  Weeks    Status  New      Additional Long Term Goals   Additional Long Term Goals  Yes      PT LONG TERM GOAL #6   Title  Patient will be able to ambulate 231ft with LRD to demonstrate safety with  ambulation over community distances    Baseline  06/19/18 58ft with CGA for safety    Time  8    Period  Weeks    Status  New            Plan - 07/02/18 1406    Clinical Impression Statement  PT continued to progress therex, within patient's patient limits. PT utilized therex to improve gait mechanics and for general strength for transfers. PT explained muscle activation needed for foot clearance and safety with ambulation, patient verbalized understanding. PT continued toattempt to improve quality of gait and safety, which patient is demonstrating good carry over of RW safety. and good carry over of cuing thorughout session. PT will continue progression as able,     Personal Factors and Comorbidities  Age;Comorbidity 1;Comorbidity 2;Comorbidity 3+;Fitness;Past/Current Experience;Time since onset of injury/illness/exacerbation    Comorbidities  Dementia, CVA, COPD, CHF, DM2, HTN, HL, Afib, asthma    Examination-Activity Limitations  Bathing;Bed Mobility;Dressing;Stairs;Bend;Lift;Locomotion Level;Squat;Carry;Transfers;Toileting    Rehab Potential  Fair    PT Frequency  2x / week    PT Duration  8 weeks  PT Treatment/Interventions  ADLs/Self Care Home Management;Cryotherapy;Gait training;Therapeutic exercise;Patient/family education;Manual techniques;Passive range of motion;Joint Manipulations;Spinal Manipulations;Wheelchair mobility training;Neuromuscular re-education;Balance training;Functional mobility training;Electrical Stimulation;Moist Heat;DME Instruction;Therapeutic activities    PT Next Visit Plan  gait training, HEP review    PT Home Exercise Plan  STS, seated marching, seated abd RTB    Consulted and Agree with Plan of Care  Patient       Patient will benefit from skilled therapeutic intervention in order to improve the following deficits and impairments:     Visit Diagnosis: Chronic pain of left knee  Chronic pain of right knee     Problem List Patient Active  Problem List   Diagnosis Date Noted  . Acute CVA (cerebrovascular accident) ([place]) 12/02/2017  . Acute ischemic stroke ([place]) 12/02/2017  . A-fib ([place]) 05/22/2017  . Chest pain 02/21/2017  . AKI (acute kidney injury) ([place]) 02/21/2017  . SOB (shortness of breath) 02/08/2017  . Atrial fibrillation with RVR ([place]) 02/08/2017  . GI bleeding 08/06/2014  . Anemia 08/06/2014  . Bradycardia 08/06/2014  . Atrial fibrillation ([place]) 08/06/2014  . Hyponatremia 08/06/2014  . Chronic diastolic heart failure ([place]) 08/06/2014  . Diabetes ([place]) 08/06/2014  . OSA (obstructive sleep apnea) 08/06/2014   Shelton Silvas PT, DPT Shelton Silvas 07/02/2018, 2:32 PM  [facility] [address] [place], [address] Phone: [phone]   Fax:  [phone]  Name: Derek Shelton MRN: [id number] Date of Birth: [birth year]

## 2018-07-04 ENCOUNTER — Ambulatory Visit: Payer: Medicare Other | Admitting: Physical Therapy

## 2018-07-09 ENCOUNTER — Encounter: Payer: Self-pay | Admitting: Physical Therapy

## 2018-07-09 ENCOUNTER — Ambulatory Visit: Payer: Medicare Other | Admitting: Physical Therapy

## 2018-07-09 ENCOUNTER — Other Ambulatory Visit: Payer: Self-pay

## 2018-07-09 DIAGNOSIS — M25562 Pain in left knee: Secondary | ICD-10-CM

## 2018-07-09 DIAGNOSIS — G8929 Other chronic pain: Secondary | ICD-10-CM

## 2018-07-09 NOTE — Therapy (Signed)
[facility] [address] [place], [address] Phone: [phone]   Fax:  [phone]  Physical Therapy Treatment  Patient Details  Name: Derek Shelton MRN: [id number] Date of Birth: 10-Oct-1940 Referring Provider (PT): Dorthula Perfect Date: 07/09/2018  PT End of Session - 07/09/18 1348    Visit Number  5    Number of Visits  17    Date for PT Re-Evaluation  08/15/18    PT Start Time  0130    PT Stop Time  0159    PT Time Calculation (min)  29 min    Activity Tolerance  Patient limited by pain    Behavior During Therapy  Flat affect       Past Medical History:  Diagnosis Date  . Anemia   . Asthma   . CHF (congestive heart failure) ([place])   . COPD (chronic obstructive pulmonary disease) ([place])   . Coronary artery disease   . Dementia ([place])    Per son's report  . Diabetes mellitus without complication ([place])   . Dysrhythmia    Atrial Fibrillation  . GERD (gastroesophageal reflux disease)   . Hyperlipidemia   . Hypertension   . Hypothyroidism   . Shortness of breath dyspnea   . Sleep apnea   . Thyroid disease     Past Surgical History:  Procedure Laterality Date  . CARDIAC CATHETERIZATION    . COLONOSCOPY N/A 08/10/2014   Procedure: COLONOSCOPY;  Surgeon: Manya Silvas, MD;  Location: [facility] ENDOSCOPY;  Service: Endoscopy;  Laterality: N/A;  . COLONOSCOPY WITH PROPOFOL N/A 04/05/2016   Procedure: COLONOSCOPY WITH PROPOFOL;  Surgeon: Lollie Sails, MD;  Location: [facility] ENDOSCOPY;  Service: Endoscopy;  Laterality: N/A;  . ESOPHAGOGASTRODUODENOSCOPY N/A 08/08/2014   Procedure: ESOPHAGOGASTRODUODENOSCOPY (EGD);  Surgeon: Manya Silvas, MD;  Location: [facility] ENDOSCOPY;  Service: Endoscopy;  Laterality: N/A;  plan for early afternoon  . EYE SURGERY    . GIVENS CAPSULE STUDY N/A 08/12/2014   Procedure: GIVENS CAPSULE STUDY;  Surgeon: Manya Silvas, MD;  Location: [facility] ENDOSCOPY;  Service:  Endoscopy;  Laterality: N/A;  . rectal fistula N/A     There were no vitals filed for this visit.  Subjective Assessment - 07/09/18 1346    Subjective  Reports no episodes of dizziness. No pain so far.    Pertinent History  Interpreter used and wife with patient to give history d/t cognitive impairments (dementia). Patient presenting with bilat knee pain. Pertinent history of CVA 09/2017 with associated \ L sided weakness, has been using WC in community and RW in home since, though wife reports he does not like to use the RW (ambulatory prior). Pt reports bilat knee pain that has been going on for some time with exacerbation recently since he has not been having HH (since Dec). Wife reports no pain at rest, only knee pain with walking/standing. Son helps patient with bathing and dressing, as he cannot stand for longer than 23mins without falling or having pain. Is able to complete bed mobility ind, and chair <> chair transfers IND, and propel his own WC. Patient has stairs to enter and exit home and wife reports his 2 sons carry him in his WC up and down steps. Patient is continent, but uses urinal d/t decreased motivation, and adult diaper to prevent accidents. Reports highest pain level 7/10 over the past week, and best 0/10 with rest.     Limitations  Walking;Lifting;House  hold activities;Standing    How long can you sit comfortably?  unlimited    How long can you stand comfortably?  62mins     How long can you walk comfortably?  20mins    Diagnostic tests  None          Ther-Ex - STS 3x 10 with heavy cuing initially for full hip ext at stand phase, intermittent cuing and encouragement to continue needed throughout exercise - Standing hip abd YTB x10 on LLE, attempted without band on RLE without success; attempted to modify to foot touch out and in with patient refusing to attempt and becoming frustrated with PT. PT educated patient on purpose of therex for increased LE strengthening for  independence to attempt to increase compliance, but patient just continues stern "no" to continue standing or seated therex.  Gait Training Over 175ft ambulation with 3 seated rest breaks; 12min; PT cuing patient for  step through pattern, as he has decreased LLE stance time with RLE step to, which he is able to correct with cuing until he begins to get fatigued and L knee pain increases. Reports 7/10 L knee pain following   All gait training with WC follow as patient is very impulsive when he needs to sit, unable to follow cues to wait for WC to be braked to complete stand to sit transfer for safety.                       PT Education - 07/09/18 1348    Education Details  gait training, therex form, pain science    Person(s) Educated  Patient    Methods  Explanation;Demonstration;Tactile cues;Verbal cues    Comprehension  Verbalized understanding;Returned demonstration;Verbal cues required;Tactile cues required       PT Short Term Goals - 06/20/18 1653      PT SHORT TERM GOAL #2   Title  Patient will be able to ascend/descend 2 steps without handrail with reciprocol gait, IND,  to demonstrate PLOF and safety entering/exiting home    Baseline  06/19/18 able to ascend 2steps with bilat handrails CGA for safety    Time  4    Period  Weeks    Status  New      PT SHORT TERM GOAL #3   Title  Patient will be able to ambulate 49ft with LRD to demonstrate safety with ambulation over community distances    Baseline   06/19/18 59ft with CGA for safety    Time  4    Period  Weeks    Status  New      PT SHORT TERM GOAL #4   Title  Pt will increase 10MWT to 0.27m/s in order to demonstrate clinically significant improvement in household ambulation.     Baseline  06/19/18 unable to ambulate 40m    Time  4    Period  Weeks    Status  New      PT SHORT TERM GOAL #5   Title   Pt will decrease 5TSTS by at least 3 seconds in order to demonstrate clinically significant  improvement in LE strength    Baseline   06/19/18 18sec    Time  8    Period  Weeks    Status  New        PT Long Term Goals - 06/20/18 1643      PT LONG TERM GOAL #1   Title  Pt will increase LEFS by at least  9 points in order to demonstrate significant improvement in lower extremity function.     Baseline  06/19/18 7/80    Time  8    Period  Weeks    Status  New      PT LONG TERM GOAL #2   Title  Pt will decrease 5TSTS to 10sec to demonstrate normal LE strength    Baseline  06/19/18 18sec    Time  8    Period  Weeks    Status  New      PT LONG TERM GOAL #3   Title  Pt will decrease worst pain as reported on NPRS by at least 3 points in order to demonstrate clinically significant reduction in ankle/foot pain.     Baseline  06/19/18 8/10    Time  8    Period  Weeks    Status  New      PT LONG TERM GOAL #4   Title  Pt will increase 10MWT to 1.52m/s in order to demonstrate clinically significant improvement in community ambulation.     Baseline  06/19/18 unable to ambulate 57m    Time  8    Period  Weeks    Status  New      PT LONG TERM GOAL #5   Title  Patient will be able to ascend/descend 4 steps without handrail with reciprocol gait, IND, to demonstrate PLOF and safety entering/exiting home    Baseline  06/19/18 able to ascend 2steps with bilat handrails CGA for safety    Time  8    Period  Weeks    Status  New      Additional Long Term Goals   Additional Long Term Goals  Yes      PT LONG TERM GOAL #6   Title  Patient will be able to ambulate 266ft with LRD to demonstrate safety with ambulation over community distances    Baseline  06/19/18 5ft with CGA for safety    Time  8    Period  Weeks    Status  New            Plan - 07/09/18 1631    Clinical Impression Statement  PT attempted to continue gait and therex progression, with patient very unwilling this session. Following education on importance of therex for carry over independent function and attempting  to modify therex, patient continued to refuse therex or gait any further. PT educated patient on need for motion to increase independence and to decrease pain.    Personal Factors and Comorbidities  Age;Comorbidity 1;Comorbidity 2;Comorbidity 3+;Fitness;Past/Current Experience;Time since onset of injury/illness/exacerbation    Comorbidities  Dementia, CVA, COPD, CHF, DM2, HTN, HL, Afib, asthma    Examination-Activity Limitations  Bathing;Bed Mobility;Dressing;Stairs;Bend;Lift;Locomotion Level;Squat;Carry;Transfers;Success;Interpersonal Relationship;Yard Work;Cleaning;Laundry;Community Activity    Stability/Clinical Decision Making  Evolving/Moderate complexity    Clinical Decision Making  Moderate    Rehab Potential  Fair    PT Frequency  2x / week    PT Duration  8 weeks    PT Treatment/Interventions  ADLs/Self Care Home Management;Cryotherapy;Gait training;Therapeutic exercise;Patient/family education;Manual techniques;Passive range of motion;Joint Manipulations;Spinal Manipulations;Wheelchair mobility training;Neuromuscular re-education;Balance training;Functional mobility training;Electrical Stimulation;Moist Heat;DME Instruction;Therapeutic activities    PT Next Visit Plan  therex progression    PT Home Exercise Plan  STS, seated marching, seated abd RTB    Consulted and Agree with Plan of Care  Patient       Patient will benefit from skilled  therapeutic intervention in order to improve the following deficits and impairments:  Abnormal gait, Decreased balance, Decreased endurance, Decreased mobility, Difficulty walking, Cardiopulmonary status limiting activity, Decreased activity tolerance, Decreased coordination, Decreased strength, Decreased safety awareness, Decreased range of motion, Improper body mechanics, Obesity, Impaired flexibility, Increased fascial restricitons, Postural dysfunction, Pain  Visit Diagnosis:     Problem  List Patient Active Problem List   Diagnosis Date Noted  . Acute CVA (cerebrovascular accident) ([place]) 12/02/2017  . Acute ischemic stroke ([place]) 12/02/2017  . A-fib ([place]) 05/22/2017  . Chest pain 02/21/2017  . AKI (acute kidney injury) ([place]) 02/21/2017  . SOB (shortness of breath) 02/08/2017  . Atrial fibrillation with RVR ([place]) 02/08/2017  . GI bleeding 08/06/2014  . Anemia 08/06/2014  . Bradycardia 08/06/2014  . Atrial fibrillation ([place]) 08/06/2014  . Hyponatremia 08/06/2014  . Chronic diastolic heart failure ([place]) 08/06/2014  . Diabetes ([place]) 08/06/2014  . OSA (obstructive sleep apnea) 08/06/2014   Shelton Silvas PT, DPT Shelton Silvas 07/10/2018, 12:41 PM  [facility] [address], [place], [address] Phone: [phone]   Fax:  [phone]  Name: Derek Shelton MRN: [id number] Date of Birth: March 28, 1940

## 2018-07-11 ENCOUNTER — Ambulatory Visit: Payer: Medicare Other | Admitting: Physical Therapy

## 2018-07-16 ENCOUNTER — Ambulatory Visit: Payer: Medicare Other | Admitting: Physical Therapy

## 2018-07-16 ENCOUNTER — Other Ambulatory Visit: Payer: Self-pay

## 2018-07-16 ENCOUNTER — Encounter: Payer: Self-pay | Admitting: Physical Therapy

## 2018-07-16 DIAGNOSIS — M25562 Pain in left knee: Secondary | ICD-10-CM

## 2018-07-16 DIAGNOSIS — R262 Difficulty in walking, not elsewhere classified: Secondary | ICD-10-CM

## 2018-07-16 DIAGNOSIS — M25561 Pain in right knee: Secondary | ICD-10-CM

## 2018-07-16 DIAGNOSIS — G8929 Other chronic pain: Secondary | ICD-10-CM

## 2018-07-16 NOTE — Therapy (Signed)
[facility] [address] [place], [address] Phone: [phone]   Fax:  [phone]  Physical Therapy Treatment  Patient Details  Name: MACALISTER ARNAUD MRN: [id number] Date of Birth: Oct 13, 1940 Referring Provider (PT): Dorthula Perfect Date: 07/16/2018  PT End of Session - 07/16/18 1429    Visit Number  6    Number of Visits  17    Date for PT Re-Evaluation  08/15/18    PT Start Time  0130    PT Stop Time  0215    PT Time Calculation (min)  45 min    Activity Tolerance  Patient limited by pain    Behavior During Therapy  Flat affect       Past Medical History:  Diagnosis Date  . Anemia   . Asthma   . CHF (congestive heart failure) ([place])   . COPD (chronic obstructive pulmonary disease) ([place])   . Coronary artery disease   . Dementia ([place])    Per son's report  . Diabetes mellitus without complication ([place])   . Dysrhythmia    Atrial Fibrillation  . GERD (gastroesophageal reflux disease)   . Hyperlipidemia   . Hypertension   . Hypothyroidism   . Shortness of breath dyspnea   . Sleep apnea   . Thyroid disease     Past Surgical History:  Procedure Laterality Date  . CARDIAC CATHETERIZATION    . COLONOSCOPY N/A 08/10/2014   Procedure: COLONOSCOPY;  Surgeon: Manya Silvas, MD;  Location: [facility] ENDOSCOPY;  Service: Endoscopy;  Laterality: N/A;  . COLONOSCOPY WITH PROPOFOL N/A 04/05/2016   Procedure: COLONOSCOPY WITH PROPOFOL;  Surgeon: Lollie Sails, MD;  Location: [facility] ENDOSCOPY;  Service: Endoscopy;  Laterality: N/A;  . ESOPHAGOGASTRODUODENOSCOPY N/A 08/08/2014   Procedure: ESOPHAGOGASTRODUODENOSCOPY (EGD);  Surgeon: Manya Silvas, MD;  Location: [facility] ENDOSCOPY;  Service: Endoscopy;  Laterality: N/A;  plan for early afternoon  . EYE SURGERY    . GIVENS CAPSULE STUDY N/A 08/12/2014   Procedure: GIVENS CAPSULE STUDY;  Surgeon: Manya Silvas, MD;  Location[facility] ENDOSCOPY;  Service:  Endoscopy;  Laterality: N/A;  . rectal fistula N/A     There were no vitals filed for this visit.  Subjective Assessment - 07/16/18 1338    Subjective  Patient reports no pain to date. Reports no dizziness. Reports he has been completing HEP with some compliance    Pertinent History  Interpreter used and wife with patient to give history d/t cognitive impairments (dementia). Patient presenting with bilat knee pain. Pertinent history of CVA 09/2017 with associated \ L sided weakness, has been using WC in community and RW in home since, though wife reports he does not like to use the RW (ambulatory prior). Pt reports bilat knee pain that has been going on for some time with exacerbation recently since he has not been having HH (since Dec). Wife reports no pain at rest, only knee pain with walking/standing. Son helps patient with bathing and dressing, as he cannot stand for longer than 50mins without falling or having pain. Is able to complete bed mobility ind, and chair <> chair transfers IND, and propel his own WC. Patient has stairs to enter and exit home and wife reports his 2 sons carry him in his WC up and down steps. Patient is continent, but uses urinal d/t decreased motivation, and adult diaper to prevent accidents. Reports highest pain level 7/10 over the past week, and best 0/10  with rest.     Limitations  Walking;Lifting;House hold activities;Standing    How long can you sit comfortably?  unlimited    How long can you stand comfortably?  35mins     How long can you walk comfortably?  72mins    Diagnostic tests  None         Ther-Ex - Seated tricep dips 3x 8 with demo and max cuing for proper form with VC throughout for proper form, and to complete reps in a row without rest - STS without UE 3x 8/8/6 with TC for full stand with full hip ext with TC needed for full stand. Following second set patient frustrated, does not want to complete another set, but agrees he believes he can do 6 reps,  which he completes well  - Seated toe raise 2x 10 with education on purpose of DF in gait for foot clearance - Seated marching alt 3x 10 each LE with cuing for upright posture throughout therex and cuing initially for eccentric control with good carry over  Gait Training Over 188ft ambulation with 2 seated rest breaks; 43min; PT cuing patient for  step through pattern, as he has decreased LLE stance time with RLE step to, decent carry over. Cuing for utilizing UE with RW to offset LLE wt bearing to increase energy conservation and decrease discomfort. Patient able to weight shift with UE use with good effot Following tricep dip therex explained purpose of carry over to gait                       PT Education - 07/16/18 1402    Education Details  gait training, therex    Person(s) Educated  Patient    Methods  Explanation;Demonstration;Tactile cues;Verbal cues    Comprehension  Verbalized understanding;Returned demonstration;Verbal cues required;Tactile cues required       PT Short Term Goals - 06/20/18 1653      PT SHORT TERM GOAL #2   Title  Patient will be able to ascend/descend 2 steps without handrail with reciprocol gait, IND,  to demonstrate PLOF and safety entering/exiting home    Baseline  06/19/18 able to ascend 2steps with bilat handrails CGA for safety    Time  4    Period  Weeks    Status  New      PT SHORT TERM GOAL #3   Title  Patient will be able to ambulate 12ft with LRD to demonstrate safety with ambulation over community distances    Baseline   06/19/18 27ft with CGA for safety    Time  4    Period  Weeks    Status  New      PT SHORT TERM GOAL #4   Title  Pt will increase 10MWT to 0.70m/s in order to demonstrate clinically significant improvement in household ambulation.     Baseline  06/19/18 unable to ambulate 31m    Time  4    Period  Weeks    Status  New      PT SHORT TERM GOAL #5   Title   Pt will decrease 5TSTS by at least 3 seconds in  order to demonstrate clinically significant improvement in LE strength    Baseline   06/19/18 18sec    Time  8    Period  Weeks    Status  New        PT Long Term Goals - 06/20/18 1643  PT LONG TERM GOAL #1   Title  Pt will increase LEFS by at least 9 points in order to demonstrate significant improvement in lower extremity function.     Baseline  06/19/18 7/80    Time  8    Period  Weeks    Status  New      PT LONG TERM GOAL #2   Title  Pt will decrease 5TSTS to 10sec to demonstrate normal LE strength    Baseline  06/19/18 18sec    Time  8    Period  Weeks    Status  New      PT LONG TERM GOAL #3   Title  Pt will decrease worst pain as reported on NPRS by at least 3 points in order to demonstrate clinically significant reduction in ankle/foot pain.     Baseline  06/19/18 8/10    Time  8    Period  Weeks    Status  New      PT LONG TERM GOAL #4   Title  Pt will increase 10MWT to 1.13m/s in order to demonstrate clinically significant improvement in community ambulation.     Baseline  06/19/18 unable to ambulate 64m    Time  8    Period  Weeks    Status  New      PT LONG TERM GOAL #5   Title  Patient will be able to ascend/descend 4 steps without handrail with reciprocol gait, IND, to demonstrate PLOF and safety entering/exiting home    Baseline  06/19/18 able to ascend 2steps with bilat handrails CGA for safety    Time  8    Period  Weeks    Status  New      Additional Long Term Goals   Additional Long Term Goals  Yes      PT LONG TERM GOAL #6   Title  Patient will be able to ambulate 237ft with LRD to demonstrate safety with ambulation over community distances    Baseline  06/19/18 46ft with CGA for safety    Time  8    Period  Weeks    Status  New            Plan - 07/16/18 1433    Clinical Impression Statement  Patient more willing to complete therex and allow for modifications for pain, but to conitnue movement this session. 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 Patient continues to require heavy cuing and encouragement for proper gait technique and form with therex, with good carry over with persistent cuing. PT will continue progression as able to return pt to PLOF and increase ind and QOL.    Personal Factors and Comorbidities  Age;Comorbidity 1;Comorbidity 2;Comorbidity 3+;Fitness;Past/Current Experience;Time since onset of injury/illness/exacerbation    Comorbidities  Dementia, CVA, COPD, CHF, DM2, HTN, HL, Afib, asthma    Examination-Activity Limitations  Bathing;Bed Mobility;Dressing;Stairs;Bend;Lift;Locomotion Level;Squat;Carry;Transfers;[place];Interpersonal Relationship;Yard Work;Cleaning;Laundry;Community Activity    Stability/Clinical Decision Making  Evolving/Moderate complexity    Clinical Decision Making  Moderate    Rehab Potential  Fair    PT Frequency  2x / week    PT Duration  8 weeks    PT Treatment/Interventions  ADLs/Self Care Home Management;Cryotherapy;Gait training;Therapeutic exercise;Patient/family education;Manual techniques;Passive range of motion;Joint Manipulations;Spinal Manipulations;Wheelchair mobility training;Neuromuscular re-education;Balance training;Functional mobility training;Electrical Stimulation;Moist Heat;DME Instruction;Therapeutic activities    PT Next  Visit Plan  therex progression    PT Home Exercise Plan  STS, seated marching, seated abd RTB    Consulted and Agree with Plan of Care  Patient       Patient will benefit from skilled therapeutic intervention in order to improve the following deficits and impairments:  Abnormal gait, Decreased balance, Decreased endurance, Decreased mobility, Difficulty walking, Cardiopulmonary status limiting activity, Decreased activity tolerance, Decreased coordination, Decreased strength, Decreased safety awareness,  Decreased range of motion, Improper body mechanics, Obesity, Impaired flexibility, Increased fascial restricitons, Postural dysfunction, Pain  Visit Diagnosis: 1. Chronic pain of left knee   2. Chronic pain of right knee   3. Difficulty in walking, not elsewhere classified        Problem List Patient Active Problem List   Diagnosis Date Noted  . Acute CVA (cerebrovascular accident) ([place]) 12/02/2017  . Acute ischemic stroke ([place]) 12/02/2017  . A-fib ([place]) 05/22/2017  . Chest pain 02/21/2017  . AKI (acute kidney injury) ([place]) 02/21/2017  . SOB (shortness of breath) 02/08/2017  . Atrial fibrillation with RVR ([place]) 02/08/2017  . GI bleeding 08/06/2014  . Anemia 08/06/2014  . Bradycardia 08/06/2014  . Atrial fibrillation ([place]) 08/06/2014  . Hyponatremia 08/06/2014  . Chronic diastolic heart failure ([place]) 08/06/2014  . Diabetes ([place]) 08/06/2014  . OSA (obstructive sleep apnea) 08/06/2014   Shelton Silvas PT, DPT Shelton Silvas 07/16/2018, 2:37 PM  [facility] [address], [place], [address] Phone: [phone]   Fax:  [phone]  Name: LINDSAY SOULLIERE MRN: [id number] Date of Birth: 1940-11-19

## 2018-07-17 ENCOUNTER — Encounter: Payer: Self-pay | Admitting: Urology

## 2018-07-17 ENCOUNTER — Ambulatory Visit (INDEPENDENT_AMBULATORY_CARE_PROVIDER_SITE_OTHER): Payer: Medicare Other | Admitting: Urology

## 2018-07-17 VITALS — BP 148/86 | HR 70 | Ht 64.0 in

## 2018-07-17 DIAGNOSIS — R32 Unspecified urinary incontinence: Secondary | ICD-10-CM

## 2018-07-17 DIAGNOSIS — N401 Enlarged prostate with lower urinary tract symptoms: Secondary | ICD-10-CM | POA: Diagnosis not present

## 2018-07-17 DIAGNOSIS — M5136 Other intervertebral disc degeneration, lumbar region: Secondary | ICD-10-CM | POA: Insufficient documentation

## 2018-07-17 DIAGNOSIS — R350 Frequency of micturition: Secondary | ICD-10-CM | POA: Diagnosis not present

## 2018-07-17 DIAGNOSIS — M48061 Spinal stenosis, lumbar region without neurogenic claudication: Secondary | ICD-10-CM | POA: Insufficient documentation

## 2018-07-17 DIAGNOSIS — M179 Osteoarthritis of knee, unspecified: Secondary | ICD-10-CM | POA: Insufficient documentation

## 2018-07-17 DIAGNOSIS — M1710 Unilateral primary osteoarthritis, unspecified knee: Secondary | ICD-10-CM | POA: Insufficient documentation

## 2018-07-17 LAB — BLADDER SCAN AMB NON-IMAGING: Scan Result: 28

## 2018-07-17 LAB — URINALYSIS, COMPLETE
Bilirubin, UA: NEGATIVE
Ketones, UA: NEGATIVE
Leukocytes,UA: NEGATIVE
Nitrite, UA: NEGATIVE
Protein,UA: NEGATIVE
Specific Gravity, UA: 1.015 (ref 1.005–1.030)
Urobilinogen, Ur: 0.2 mg/dL (ref 0.2–1.0)
pH, UA: 7 (ref 5.0–7.5)

## 2018-07-17 LAB — MICROSCOPIC EXAMINATION
Bacteria, UA: NONE SEEN
Epithelial Cells (non renal): NONE SEEN /hpf (ref 0–10)

## 2018-07-17 NOTE — Progress Notes (Signed)
07/17/2018 7:08 AM   Derek Shelton [birth year] [id number]  Referring provider: Perrin Maltese, MD [place],  [place] [address]  Chief Complaint  Patient presents with  . Urinary Incontinence    HPI: 78 year old male presents for annual follow-up of BPH and lower urinary tract symptoms.  He is here today with his son and his wife was on the phone.  He initially saw Dr. Junious Silk May 2019.  He had previously been on finasteride and it was elected to restart this medication.  He has dementia but had no complaints today.  His son and his wife indicated he has had no significant lower urinary tract symptoms or incontinence. Denies dysuria, gross hematuria or flank/abdominal/pelvic/scrotal pain.  PMH: Past Medical History:  Diagnosis Date  . Anemia   . Asthma   . CHF (congestive heart failure) ([place])   . COPD (chronic obstructive pulmonary disease) ([place])   . Coronary artery disease   . Dementia ([place])    Per son's report  . Diabetes mellitus without complication ([place])   . Dysrhythmia    Atrial Fibrillation  . GERD (gastroesophageal reflux disease)   . Hyperlipidemia   . Hypertension   . Hypothyroidism   . Shortness of breath dyspnea   . Sleep apnea   . Thyroid disease     Surgical History: Past Surgical History:  Procedure Laterality Date  . CARDIAC CATHETERIZATION    . COLONOSCOPY N/A 08/10/2014   Procedure: COLONOSCOPY;  Surgeon: Manya Silvas, MD;  Location: [facility] ENDOSCOPY;  Service: Endoscopy;  Laterality: N/A;  . COLONOSCOPY WITH PROPOFOL N/A 04/05/2016   Procedure: COLONOSCOPY WITH PROPOFOL;  Surgeon: Lollie Sails, MD;  Location: [facility] ENDOSCOPY;  Service: Endoscopy;  Laterality: N/A;  . ESOPHAGOGASTRODUODENOSCOPY N/A 08/08/2014   Procedure: ESOPHAGOGASTRODUODENOSCOPY (EGD);  Surgeon: Manya Silvas, MD;  Location: [facility] ENDOSCOPY;  Service: Endoscopy;  Laterality: N/A;  plan for early afternoon  . EYE SURGERY    . GIVENS CAPSULE STUDY N/A 08/12/2014    Procedure: GIVENS CAPSULE STUDY;  Surgeon: Manya Silvas, MD;  Location: [facility] ENDOSCOPY;  Service: Endoscopy;  Laterality: N/A;  . rectal fistula N/A     Home Medications:  Allergies as of 07/17/2018      Reactions   Catapres [clonidine Hcl]    Coreg [carvedilol]    Penicillins       Medication List       Accurate as of July 17, 2018 11:59 PM. If you have any questions, ask your nurse or doctor.        apixaban 5 MG Tabs tablet Commonly known as: ELIQUIS Take 1 tablet (5 mg total) by mouth 2 (two) times daily.   atorvastatin 40 MG tablet Commonly known as: LIPITOR Take 1 tablet (40 mg total) by mouth daily at 6 PM.   benzonatate 100 MG capsule Commonly known as: TESSALON Take 100 mg by mouth 3 (three) times daily as needed for cough.   budesonide-formoterol 160-4.5 MCG/ACT inhaler Commonly known as: SYMBICORT Inhale 2 puffs into the lungs 2 (two) times daily.   digoxin 0.125 MG tablet Commonly known as: LANOXIN Take 1 tablet (0.125 mg total) by mouth daily.   donepezil 5 MG tablet Commonly known as: ARICEPT Take 5 mg by mouth at bedtime.   DULoxetine 60 MG capsule Commonly known as: CYMBALTA Take 60 mg by mouth daily.   esomeprazole 40 MG capsule Commonly known as: NEXIUM Take 40 mg by mouth daily at 12 noon.   ferrous  sulfate 325 (65 FE) MG EC tablet Take 325 mg by mouth daily.   furosemide 20 MG tablet Commonly known as: LASIX Take 20 mg by mouth 2 (two) times daily.   glimepiride 2 MG tablet Commonly known as: AMARYL Take 3 mg by mouth 2 (two) times daily.   guaiFENesin-dextromethorphan 100-10 MG/5ML syrup Commonly known as: ROBITUSSIN DM Take 5 mLs by mouth every 4 (four) hours as needed for cough.   ipratropium-albuterol 0.5-2.5 (3) MG/3ML Soln Commonly known as: DUONEB Inhale 3 mLs into the lungs every 6 (six) hours as needed for shortness of breath.   levothyroxine 112 MCG tablet Commonly known as: SYNTHROID Take 112 mcg by mouth  daily before breakfast.   Melatonin 3 MG Tabs Take 3 mg by mouth at bedtime as needed (sleep).   metoprolol tartrate 100 MG tablet Commonly known as: LOPRESSOR Take 1 tablet (100 mg total) by mouth 2 (two) times daily.   montelukast 10 MG tablet Commonly known as: SINGULAIR Take 10 mg by mouth daily.   omega-3 acid ethyl esters 1 g capsule Commonly known as: LOVAZA Take 2 g by mouth 2 (two) times daily.   potassium chloride SA 20 MEQ tablet Commonly known as: K-DUR Take 40 mEq by mouth See admin instructions. Take 2 tablets (40MEQ) by mouth twice daily for 3 days as directed   Tresiba FlexTouch 100 UNIT/ML Sopn FlexTouch Pen Generic drug: insulin degludec Inject 15 Units into the skin at bedtime.       Allergies:  Allergies  Allergen Reactions  . Catapres [Clonidine Hcl]   . Coreg [Carvedilol]   . Penicillins     Family History: Family History  Problem Relation Age of Onset  . Diabetes Mother     Social History:  reports that he has quit smoking. His smokeless tobacco use includes chew. He reports that he does not drink alcohol or use drugs.  ROS: UROLOGY Frequent Urination?: No Hard to postpone urination?: No Burning/pain with urination?: No Get up at night to urinate?: No Leakage of urine?: No Urine stream starts and stops?: No Trouble starting stream?: No Do you have to strain to urinate?: No Blood in urine?: No Urinary tract infection?: No Sexually transmitted disease?: No Injury to kidneys or bladder?: No Painful intercourse?: No Weak stream?: No Erection problems?: No Penile pain?: No  Gastrointestinal Nausea?: No Vomiting?: No Indigestion/heartburn?: No Diarrhea?: No Constipation?: No  Constitutional Fever: No Night sweats?: No Weight loss?: No Fatigue?: No  Skin Skin rash/lesions?: No Itching?: No  Eyes Blurred vision?: No Double vision?: No  Ears/Nose/Throat Sore throat?: No Sinus problems?: No  Hematologic/Lymphatic  Swollen glands?: No Easy bruising?: No  Cardiovascular Leg swelling?: No Chest pain?: No  Respiratory Cough?: No Shortness of breath?: Yes  Endocrine Excessive thirst?: No  Musculoskeletal Back pain?: No Joint pain?: Yes  Neurological Headaches?: No Dizziness?: No  Psychologic Depression?: No Anxiety?: No  Physical Exam: BP (!) 148/86   Pulse 70   Ht 5\' 4"  (1.626 m)   SpO2 96%   BMI 32.61 kg/m   Constitutional:  Alert, No acute distress. HEENT: [place] AT, moist mucus membranes.  Trachea midline, no masses. Cardiovascular: No clubbing, cyanosis, or edema. Respiratory: Normal respiratory effort, no increased work of breathing. GI: Abdomen is soft, nontender, nondistended, no abdominal masses GU: No CVA tenderness Lymph: No cervical or inguinal lymphadenopathy. Skin: No rashes, bruises or suspicious lesions. Neurologic: Grossly intact, no focal deficits, moving all 4 extremities.   Assessment & Plan:  78 year old male with BPH and lower urinary tract symptoms doing well on finasteride.  Finasteride was refilled.  PVR by bladder scan today was 28 mL.  He will continue annual follow-up.  Abbie Sons, [place] [address] [place], [place] [address] [phone]

## 2018-07-18 ENCOUNTER — Ambulatory Visit: Payer: Medicare Other | Admitting: Physical Therapy

## 2018-07-18 ENCOUNTER — Encounter: Payer: Self-pay | Admitting: Urology

## 2018-07-18 DIAGNOSIS — N400 Enlarged prostate without lower urinary tract symptoms: Secondary | ICD-10-CM | POA: Insufficient documentation

## 2018-07-18 MED ORDER — FINASTERIDE 5 MG PO TABS: 5.0000 mg | ORAL_TABLET | Freq: Every day | ORAL | 3 refills | Status: DC

## 2018-07-23 ENCOUNTER — Ambulatory Visit: Payer: Medicare Other | Admitting: Physical Therapy

## 2018-07-23 ENCOUNTER — Other Ambulatory Visit: Payer: Self-pay

## 2018-07-23 DIAGNOSIS — G8929 Other chronic pain: Secondary | ICD-10-CM

## 2018-07-23 DIAGNOSIS — M25562 Pain in left knee: Secondary | ICD-10-CM | POA: Diagnosis not present

## 2018-07-23 DIAGNOSIS — R262 Difficulty in walking, not elsewhere classified: Secondary | ICD-10-CM

## 2018-07-23 NOTE — Therapy (Signed)
[facility] [address] [place], [address] Phone: [phone]   Fax:  [phone]  Physical Therapy Treatment  Patient Details  Name: Derek Shelton MRN: [id number] Date of Birth: 04-Feb-1940 Referring Provider (PT): Dorthula Perfect Date: 07/23/2018  PT End of Session - 07/23/18 1428    Visit Number  7    Number of Visits  17    Date for PT Re-Evaluation  08/15/18    PT Start Time  0138    PT Stop Time  0218    PT Time Calculation (min)  40 min    Activity Tolerance  Patient limited by pain    Behavior During Therapy  Flat affect       Past Medical History:  Diagnosis Date  . Anemia   . Asthma   . CHF (congestive heart failure) ([place])   . COPD (chronic obstructive pulmonary disease) ([place])   . Coronary artery disease   . Dementia ([place])    Per son's report  . Diabetes mellitus without complication ([place])   . Dysrhythmia    Atrial Fibrillation  . GERD (gastroesophageal reflux disease)   . Hyperlipidemia   . Hypertension   . Hypothyroidism   . Shortness of breath dyspnea   . Sleep apnea   . Thyroid disease     Past Surgical History:  Procedure Laterality Date  . CARDIAC CATHETERIZATION    . COLONOSCOPY N/A 08/10/2014   Procedure: COLONOSCOPY;  Surgeon: Manya Silvas, MD;  Location: [facility] ENDOSCOPY;  Service: Endoscopy;  Laterality: N/A;  . COLONOSCOPY WITH PROPOFOL N/A 04/05/2016   Procedure: COLONOSCOPY WITH PROPOFOL;  Surgeon: Lollie Sails, MD;  Location: [facility] ENDOSCOPY;  Service: Endoscopy;  Laterality: N/A;  . ESOPHAGOGASTRODUODENOSCOPY N/A 08/08/2014   Procedure: ESOPHAGOGASTRODUODENOSCOPY (EGD);  Surgeon: Manya Silvas, MD;  Location: [facility] ENDOSCOPY;  Service: Endoscopy;  Laterality: N/A;  plan for early afternoon  . EYE SURGERY    . GIVENS CAPSULE STUDY N/A 08/12/2014   Procedure: GIVENS CAPSULE STUDY;  Surgeon: Manya Silvas, MD;  Location: [facility] ENDOSCOPY;  Service:  Endoscopy;  Laterality: N/A;  . rectal fistula N/A     There were no vitals filed for this visit.  Subjective Assessment - 07/23/18 1427    Subjective  Reports no pain to date. Son reports he thinks patient is negotiating steps at home "faster".    Pertinent History  Interpreter used and wife with patient to give history d/t cognitive impairments (dementia). Patient presenting with bilat knee pain. Pertinent history of CVA 09/2017 with associated \ L sided weakness, has been using WC in community and RW in home since, though wife reports he does not like to use the RW (ambulatory prior). Pt reports bilat knee pain that has been going on for some time with exacerbation recently since he has not been having HH (since Dec). Wife reports no pain at rest, only knee pain with walking/standing. Son helps patient with bathing and dressing, as he cannot stand for longer than 29mins without falling or having pain. Is able to complete bed mobility ind, and chair <> chair transfers IND, and propel his own WC. Patient has stairs to enter and exit home and wife reports his 2 sons carry him in his WC up and down steps. Patient is continent, but uses urinal d/t decreased motivation, and adult diaper to prevent accidents. Reports highest pain level 7/10 over the past week, and best 0/10 with rest.  Limitations  Walking;Lifting;House hold activities;Standing    How long can you sit comfortably?  unlimited    How long can you stand comfortably?  11mins     How long can you walk comfortably?  27mins    Diagnostic tests  None    Pain Onset  More than a month ago       Ther-Ex - Seated tricep dips 3x 8 with demo and max cuing for proper form with VC throughout for proper form, and to complete reps in a row without rest - Seated alt marching 3x 10 with cuing for posture - Seated knee ext 3# AW 3x 10 bilat with cuing for eccentric control with decent carry over - Standing bilat heel raises 3x 5 with cuing for  eccentric control - Standing marching 3x 10 with TC for full available ROM - Seated abd RTB 3x 10 with cuing for initial setup with good technique following   Gait Training Over 186ft ambulation with 1 seated rest break (during, 1 following) 63min; able to subsequently ambulate 42ft into room, following session able to ambulate 39ft. PT TC for weight shift with UE support for RLE swing to offset weight on LLE with decent carry over. Good step through pattern, until fatigue, where patient begins R step to. Able to correct following seated break.                         PT Education - 07/23/18 1427    Education Details  gait training, therex    Person(s) Educated  Patient    Methods  Explanation;Demonstration;Tactile cues;Verbal cues    Comprehension  Verbalized understanding;Returned demonstration;Verbal cues required;Tactile cues required       PT Short Term Goals - 06/20/18 1653      PT SHORT TERM GOAL #2   Title  Patient will be able to ascend/descend 2 steps without handrail with reciprocol gait, IND,  to demonstrate PLOF and safety entering/exiting home    Baseline  06/19/18 able to ascend 2steps with bilat handrails CGA for safety    Time  4    Period  Weeks    Status  New      PT SHORT TERM GOAL #3   Title  Patient will be able to ambulate 21ft with LRD to demonstrate safety with ambulation over community distances    Baseline   06/19/18 64ft with CGA for safety    Time  4    Period  Weeks    Status  New      PT SHORT TERM GOAL #4   Title  Pt will increase 10MWT to 0.20m/s in order to demonstrate clinically significant improvement in household ambulation.     Baseline  06/19/18 unable to ambulate 59m    Time  4    Period  Weeks    Status  New      PT SHORT TERM GOAL #5   Title   Pt will decrease 5TSTS by at least 3 seconds in order to demonstrate clinically significant improvement in LE strength    Baseline   06/19/18 18sec    Time  8    Period   Weeks    Status  New        PT Long Term Goals - 06/20/18 1643      PT LONG TERM GOAL #1   Title  Pt will increase LEFS by at least 9 points in order to demonstrate significant improvement in  lower extremity function.     Baseline  06/19/18 7/80    Time  8    Period  Weeks    Status  New      PT LONG TERM GOAL #2   Title  Pt will decrease 5TSTS to 10sec to demonstrate normal LE strength    Baseline  06/19/18 18sec    Time  8    Period  Weeks    Status  New      PT LONG TERM GOAL #3   Title  Pt will decrease worst pain as reported on NPRS by at least 3 points in order to demonstrate clinically significant reduction in ankle/foot pain.     Baseline  06/19/18 8/10    Time  8    Period  Weeks    Status  New      PT LONG TERM GOAL #4   Title  Pt will increase 10MWT to 1.36m/s in order to demonstrate clinically significant improvement in community ambulation.     Baseline  06/19/18 unable to ambulate 37m    Time  8    Period  Weeks    Status  New      PT LONG TERM GOAL #5   Title  Patient will be able to ascend/descend 4 steps without handrail with reciprocol gait, IND, to demonstrate PLOF and safety entering/exiting home    Baseline  06/19/18 able to ascend 2steps with bilat handrails CGA for safety    Time  8    Period  Weeks    Status  New      Additional Long Term Goals   Additional Long Term Goals  Yes      PT LONG TERM GOAL #6   Title  Patient will be able to ambulate 227ft with LRD to demonstrate safety with ambulation over community distances    Baseline  06/19/18 13ft with CGA for safety    Time  [place] - 07/23/18 1428    Clinical Impression Statement  Patient is able to complete increased therex progression, within pain limits ( mostly limited by L knee pain), fwith good form following PT cuing. Patient is able to increase walking distance before needing a break, and is able to demonstrate better gait pattern with PT  cuing and demonstration. PT will continue walking and therex progression as able.    Personal Factors and Comorbidities  Age;Comorbidity 1;Comorbidity 2;Comorbidity 3+;Fitness;Past/Current Experience;Time since onset of injury/illness/exacerbation    Comorbidities  Dementia, CVA, COPD, CHF, DM2, HTN, HL, Afib, asthma    Examination-Activity Limitations  Bathing;Bed Mobility;Dressing;Stairs;Bend;Lift;Locomotion Level;Squat;Carry;Transfers;[place];Interpersonal Relationship;Yard Work;Cleaning;Laundry;Community Activity    Stability/Clinical Decision Making  Evolving/Moderate complexity    Clinical Decision Making  Moderate    PT Treatment/Interventions  ADLs/Self Care Home Management;Cryotherapy;Gait training;Therapeutic exercise;Patient/family education;Manual techniques;Passive range of motion;Joint Manipulations;Spinal Manipulations;Wheelchair mobility training;Neuromuscular re-education;Balance training;Functional mobility training;Electrical Stimulation;Moist Heat;DME Instruction;Therapeutic activities    PT Next Visit Plan  therex progression    PT Home Exercise Plan  STS, seated marching, seated abd RTB    Consulted and Agree with Plan of Care  Patient;Family member/caregiver       Patient will benefit from skilled therapeutic intervention in order to improve the following deficits and impairments:  Abnormal gait, Decreased balance, Decreased endurance, Decreased mobility, Difficulty walking, Cardiopulmonary status limiting activity, Decreased activity  tolerance, Decreased coordination, Decreased strength, Decreased safety awareness, Decreased range of motion, Improper body mechanics, Obesity, Impaired flexibility, Increased fascial restricitons, Postural dysfunction, Pain  Visit Diagnosis: 1. Chronic pain of left knee   2. Chronic pain of right knee   3. Difficulty in walking, not elsewhere classified        Problem List Patient  Active Problem List   Diagnosis Date Noted  . BPH (benign prostatic hyperplasia) 07/18/2018  . Degeneration of lumbar intervertebral disc 07/17/2018  . Osteoarthritis of knee 07/17/2018  . Spinal stenosis of lumbar region 07/17/2018  . Acute CVA (cerebrovascular accident) ([place]) 12/02/2017  . Acute ischemic stroke ([place]) 12/02/2017  . Acute metabolic encephalopathy due to hypoglycemia 11/29/2017  . Essential hypertension 11/29/2017  . Syncope and collapse 11/28/2017  . A-fib ([place]) 05/22/2017  . Chest pain 02/21/2017  . AKI (acute kidney injury) ([place]) 02/21/2017  . SOB (shortness of breath) 02/08/2017  . Atrial fibrillation with RVR ([place]) 02/08/2017  . GI bleeding 08/06/2014  . Anemia 08/06/2014  . Bradycardia 08/06/2014  . Atrial fibrillation ([place]) 08/06/2014  . Hyponatremia 08/06/2014  . Chronic diastolic heart failure ([place]) 08/06/2014  . OSA (obstructive sleep apnea) 08/06/2014  . Memory loss or impairment 05/29/2014  . Type 2 diabetes mellitus, without long-term current use of insulin ([place]) 08/11/2010  . 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 Chronic rhinitis 08/11/2010  . Coronary artery disease 08/11/2010  . Esophageal reflux 08/11/2010  . Personal history of arthritis 08/11/2010  . Tear film insufficiency 06/07/2010  . 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 Keratoconjunctivitis sicca ([place]) 03/10/2010  . Lens replaced 03/10/2010  . Pterygium 03/10/2010   Shelton Silvas PT, DPT Shelton Silvas 07/23/2018, 2:31 PM  [facility] [address], [place], [address] Phone: [phone]   Fax:  [phone]  Name: Derek Shelton MRN: [id number] Date of Birth: December 22, 1940

## 2018-07-30 ENCOUNTER — Encounter: Payer: Self-pay | Admitting: Physical Therapy

## 2018-07-30 ENCOUNTER — Ambulatory Visit: Payer: Medicare Other | Attending: Internal Medicine | Admitting: Physical Therapy

## 2018-07-30 ENCOUNTER — Other Ambulatory Visit: Payer: Self-pay

## 2018-07-30 DIAGNOSIS — G8929 Other chronic pain: Secondary | ICD-10-CM | POA: Diagnosis present

## 2018-07-30 DIAGNOSIS — R262 Difficulty in walking, not elsewhere classified: Secondary | ICD-10-CM | POA: Insufficient documentation

## 2018-07-30 DIAGNOSIS — M25562 Pain in left knee: Secondary | ICD-10-CM | POA: Insufficient documentation

## 2018-07-30 DIAGNOSIS — M25561 Pain in right knee: Secondary | ICD-10-CM | POA: Diagnosis present

## 2018-07-30 NOTE — Therapy (Signed)
[facility] [address] [place], [address] Phone: [phone]   Fax:  [phone]  Physical Therapy Treatment  Patient Details  Name: Derek Shelton MRN: [id number] Date of Birth: [birth year] Referring Provider (PT): Dorthula Perfect Date: 07/30/2018  PT End of Session - 07/30/18 1402    Visit Number  8    Number of Visits  17    Date for PT Re-Evaluation  08/15/18    PT Start Time  0145    PT Stop Time  0225    PT Time Calculation (min)  40 min    Activity Tolerance  Patient limited by pain    Behavior During Therapy  Flat affect;WFL for tasks assessed/performed       Past Medical History:  Diagnosis Date  . Anemia   . Asthma   . CHF (congestive heart failure) ([place])   . COPD (chronic obstructive pulmonary disease) ([place])   . Coronary artery disease   . Dementia ([place])    Per son's report  . Diabetes mellitus without complication ([place])   . Dysrhythmia    Atrial Fibrillation  . GERD (gastroesophageal reflux disease)   . Hyperlipidemia   . Hypertension   . Hypothyroidism   . Shortness of breath dyspnea   . Sleep apnea   . Thyroid disease     Past Surgical History:  Procedure Laterality Date  . CARDIAC CATHETERIZATION    . COLONOSCOPY N/A 08/10/2014   Procedure: COLONOSCOPY;  Surgeon: Manya Silvas, MD;  Location: [facility] ENDOSCOPY;  Service: Endoscopy;  Laterality: N/A;  . COLONOSCOPY WITH PROPOFOL N/A 04/05/2016   Procedure: COLONOSCOPY WITH PROPOFOL;  Surgeon: Lollie Sails, MD;  Location: [facility] ENDOSCOPY;  Service: Endoscopy;  Laterality: N/A;  . ESOPHAGOGASTRODUODENOSCOPY N/A 08/08/2014   Procedure: ESOPHAGOGASTRODUODENOSCOPY (EGD);  Surgeon: Manya Silvas, MD;  Location: [facility] ENDOSCOPY;  Service: Endoscopy;  Laterality: N/A;  plan for early afternoon  . EYE SURGERY    . GIVENS CAPSULE STUDY N/A 08/12/2014   Procedure: GIVENS CAPSULE STUDY;  Surgeon: Manya Silvas, MD;  Location:  [facility] ENDOSCOPY;  Service: Endoscopy;  Laterality: N/A;  . rectal fistula N/A     There were no vitals filed for this visit.  Subjective Assessment - 07/30/18 1401    Subjective  Reports no pain at start of session. Some compliance with HEP    Pertinent History  Interpreter used and wife with patient to give history d/t cognitive impairments (dementia). Patient presenting with bilat knee pain. Pertinent history of CVA 09/2017 with associated \ L sided weakness, has been using WC in community and RW in home since, though wife reports he does not like to use the RW (ambulatory prior). Pt reports bilat knee pain that has been going on for some time with exacerbation recently since he has not been having HH (since Dec). Wife reports no pain at rest, only knee pain with walking/standing. Son helps patient with bathing and dressing, as he cannot stand for longer than 51mins without falling or having pain. Is able to complete bed mobility ind, and chair <> chair transfers IND, and propel his own WC. Patient has stairs to enter and exit home and wife reports his 2 sons carry him in his WC up and down steps. Patient is continent, but uses urinal d/t decreased motivation, and adult diaper to prevent accidents. Reports highest pain level 7/10 over the past week, and best 0/10 with rest.  Limitations  Walking;Lifting;House hold activities;Standing    How long can you sit comfortably?  unlimited    How long can you stand comfortably?  88mins     How long can you walk comfortably?  63mins    Diagnostic tests  None    Pain Onset  More than a month ago       Ther-Ex - Seated alt marching x; 10; 5# AW 2x 10  with cuing for posture - Seated knee ext 5# AW 3x 10 bilat with cuing for eccentric control, decreased height on LLE - STS 3x 8/8/6 with cuing for full stand with full hip ext and without UE remaining on chair, minimal compliance, encouragement needed to continue reps of therex - Standing bilat heel raises  3x105 with cuing for eccentric control - Standing marching 3x 5/4 (each side, patient unable/unwilling to complete more)  with TC for full available ROM - Seated abd GTB 3x 10 with cuing for eccentric control with good carry over following   Gait Training Over 160ft ambulation with a couple standing breaks, no seated breaks 71min able to subsequently ambulate 67ft into room. PT TC for weight shift with UE support for RLE swing to offset weight on LLE with decent carry over. Good step through pattern, until fatigue, where patient begins R step to. Able to correct following seated break.                         PT Education - 07/30/18 1401    Education Details  therex form    Person(s) Educated  Patient    Methods  Explanation;Demonstration;Verbal cues    Comprehension  Verbalized understanding;Returned demonstration;Verbal cues required       PT Short Term Goals - 06/20/18 1653      PT SHORT TERM GOAL #2   Title  Patient will be able to ascend/descend 2 steps without handrail with reciprocol gait, IND,  to demonstrate PLOF and safety entering/exiting home    Baseline  06/19/18 able to ascend 2steps with bilat handrails CGA for safety    Time  4    Period  Weeks    Status  New      PT SHORT TERM GOAL #3   Title  Patient will be able to ambulate 40ft with LRD to demonstrate safety with ambulation over community distances    Baseline   06/19/18 39ft with CGA for safety    Time  4    Period  Weeks    Status  New      PT SHORT TERM GOAL #4   Title  Pt will increase 10MWT to 0.12m/s in order to demonstrate clinically significant improvement in household ambulation.     Baseline  06/19/18 unable to ambulate 76m    Time  4    Period  Weeks    Status  New      PT SHORT TERM GOAL #5   Title   Pt will decrease 5TSTS by at least 3 seconds in order to demonstrate clinically significant improvement in LE strength    Baseline   06/19/18 18sec    Time  8    Period  Weeks     Status  New        PT Long Term Goals - 06/20/18 1643      PT LONG TERM GOAL #1   Title  Pt will increase LEFS by at least 9 points in order to demonstrate significant  improvement in lower extremity function.     Baseline  06/19/18 7/80    Time  8    Period  Weeks    Status  New      PT LONG TERM GOAL #2   Title  Pt will decrease 5TSTS to 10sec to demonstrate normal LE strength    Baseline  06/19/18 18sec    Time  8    Period  Weeks    Status  New      PT LONG TERM GOAL #3   Title  Pt will decrease worst pain as reported on NPRS by at least 3 points in order to demonstrate clinically significant reduction in ankle/foot pain.     Baseline  06/19/18 8/10    Time  8    Period  Weeks    Status  New      PT LONG TERM GOAL #4   Title  Pt will increase 10MWT to 1.25m/s in order to demonstrate clinically significant improvement in community ambulation.     Baseline  06/19/18 unable to ambulate 23m    Time  8    Period  Weeks    Status  New      PT LONG TERM GOAL #5   Title  Patient will be able to ascend/descend 4 steps without handrail with reciprocol gait, IND, to demonstrate PLOF and safety entering/exiting home    Baseline  06/19/18 able to ascend 2steps with bilat handrails CGA for safety    Time  8    Period  Weeks    Status  New      Additional Long Term Goals   Additional Long Term Goals  Yes      PT LONG TERM GOAL #6   Title  Patient will be able to ambulate 251ft with LRD to demonstrate safety with ambulation over community distances    Baseline  06/19/18 53ft with CGA for safety    Time  8    Period  Weeks    Status  New            Plan - 07/30/18 1408    Clinical Impression Statement  Patient is increasing distance and speed with ambulation with RW, continuing to require cuing for normalized gait and encouragement to continue ambulation without seated break. PT continued therex progression, which patient is able to complete with proper form with cuing, with  some difficulty with cuing for full hip ext with stand. Patient is able to complete therex and gait training with some increased knee pain that subsides after he rests. PT will continue therex progression for strengthening and stability as able.       Patient will benefit from skilled therapeutic intervention in order to improve the following deficits and impairments:     Visit Diagnosis: 1. Chronic pain of left knee   2. Chronic pain of right knee   3. Difficulty in walking, not elsewhere classified        Problem List Patient Active Problem List   Diagnosis Date Noted  . BPH (benign prostatic hyperplasia) 07/18/2018  . Degeneration of lumbar intervertebral disc 07/17/2018  . Osteoarthritis of knee 07/17/2018  . Spinal stenosis of lumbar region 07/17/2018  . Acute CVA (cerebrovascular accident) ([place]) 12/02/2017  . Acute ischemic stroke ([place]) 12/02/2017  . Acute metabolic encephalopathy due to hypoglycemia 11/29/2017  . Essential hypertension 11/29/2017  . Syncope and collapse 11/28/2017  . A-fib ([place]) 05/22/2017  . Chest pain 02/21/2017  . AKI (  acute kidney injury) ([place]) 02/21/2017  . SOB (shortness of breath) 02/08/2017  . Atrial fibrillation with RVR ([place]) 02/08/2017  . GI bleeding 08/06/2014  . Anemia 08/06/2014  . Bradycardia 08/06/2014  . Atrial fibrillation ([place]) 08/06/2014  . Hyponatremia 08/06/2014  . Chronic diastolic heart failure ([place]) 08/06/2014  . OSA (obstructive sleep apnea) 08/06/2014  . Memory loss or impairment 05/29/2014  . Type 2 diabetes mellitus, without long-term current use of insulin ([place]) 08/11/2010  . 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 Chronic rhinitis 08/11/2010  . Coronary artery disease 08/11/2010  . Esophageal reflux 08/11/2010  . Personal history of arthritis 08/11/2010  . Tear film insufficiency 06/07/2010  . 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 Keratoconjunctivitis sicca ([place]) 03/10/2010  . Lens replaced 03/10/2010  .  Pterygium 03/10/2010   Shelton Silvas PT, DPT Shelton Silvas 07/30/2018, 2:22 PM  [facility] [address], [place], [address] Phone: [phone]   Fax:  [phone]  Name: Derek Shelton MRN: [id number] Date of Birth: 04/28/1940

## 2018-08-01 ENCOUNTER — Other Ambulatory Visit: Payer: Self-pay

## 2018-08-01 ENCOUNTER — Ambulatory Visit: Payer: Medicare Other | Admitting: Physical Therapy

## 2018-08-01 ENCOUNTER — Encounter: Payer: Self-pay | Admitting: Physical Therapy

## 2018-08-01 DIAGNOSIS — M25562 Pain in left knee: Secondary | ICD-10-CM | POA: Diagnosis not present

## 2018-08-01 DIAGNOSIS — R262 Difficulty in walking, not elsewhere classified: Secondary | ICD-10-CM

## 2018-08-01 DIAGNOSIS — G8929 Other chronic pain: Secondary | ICD-10-CM

## 2018-08-01 DIAGNOSIS — M25561 Pain in right knee: Secondary | ICD-10-CM

## 2018-08-01 NOTE — Therapy (Signed)
[facility] [address] [place], [address] Phone: [phone]   Fax:  [phone]  Physical Therapy Treatment  Patient Details  Name: Derek Shelton MRN: [id number] Date of Birth: 08-24-1940 Referring Provider (PT): Dorthula Perfect Date: 08/01/2018  PT End of Session - 08/01/18 1440    Visit Number  9    Number of Visits  17    Date for PT Re-Evaluation  08/15/18    PT Start Time  0100    PT Stop Time  0145    PT Time Calculation (min)  45 min    Activity Tolerance  Patient limited by pain    Behavior During Therapy  Flat affect;WFL for tasks assessed/performed       Past Medical History:  Diagnosis Date  . Anemia   . Asthma   . CHF (congestive heart failure) ([place])   . COPD (chronic obstructive pulmonary disease) ([place])   . Coronary artery disease   . Dementia ([place])    Per son's report  . Diabetes mellitus without complication ([place])   . Dysrhythmia    Atrial Fibrillation  . GERD (gastroesophageal reflux disease)   . Hyperlipidemia   . Hypertension   . Hypothyroidism   . Shortness of breath dyspnea   . Sleep apnea   . Thyroid disease     Past Surgical History:  Procedure Laterality Date  . CARDIAC CATHETERIZATION    . COLONOSCOPY N/A 08/10/2014   Procedure: COLONOSCOPY;  Surgeon: Manya Silvas, MD;  Location: [facility] ENDOSCOPY;  Service: Endoscopy;  Laterality: N/A;  . COLONOSCOPY WITH PROPOFOL N/A 04/05/2016   Procedure: COLONOSCOPY WITH PROPOFOL;  Surgeon: Lollie Sails, MD;  Location: [facility] ENDOSCOPY;  Service: Endoscopy;  Laterality: N/A;  . ESOPHAGOGASTRODUODENOSCOPY N/A 08/08/2014   Procedure: ESOPHAGOGASTRODUODENOSCOPY (EGD);  Surgeon: Manya Silvas, MD;  Location: [facility] ENDOSCOPY;  Service: Endoscopy;  Laterality: N/A;  plan for early afternoon  . EYE SURGERY    . GIVENS CAPSULE STUDY N/A 08/12/2014   Procedure: GIVENS CAPSULE STUDY;  Surgeon: Manya Silvas, MD;  Location:  [facility] ENDOSCOPY;  Service: Endoscopy;  Laterality: N/A;  . rectal fistula N/A     There were no vitals filed for this visit.  Subjective Assessment - 08/01/18 1416    Subjective  No pain on arrival. Minimal compliance with HEP    Pertinent History  Interpreter used and wife with patient to give history d/t cognitive impairments (dementia). Patient presenting with bilat knee pain. Pertinent history of CVA 09/2017 with associated \ L sided weakness, has been using WC in community and RW in home since, though wife reports he does not like to use the RW (ambulatory prior). Pt reports bilat knee pain that has been going on for some time with exacerbation recently since he has not been having HH (since Dec). Wife reports no pain at rest, only knee pain with walking/standing. Son helps patient with bathing and dressing, as he cannot stand for longer than 72mins without falling or having pain. Is able to complete bed mobility ind, and chair <> chair transfers IND, and propel his own WC. Patient has stairs to enter and exit home and wife reports his 2 sons carry him in his WC up and down steps. Patient is continent, but uses urinal d/t decreased motivation, and adult diaper to prevent accidents. Reports highest pain level 7/10 over the past week, and best 0/10 with rest.     How  long can you sit comfortably?  unlimited    How long can you stand comfortably?  40mins     How long can you walk comfortably?  42mins    Diagnostic tests  None         Ther-Ex - Step up onto 4in x5 each LE leading with unilateral HHA and cuing for full stand with full hip and knee ext, seated rest needed after 2 reps on LLE, patient reports his "heart is jumping" O2 97%, pulse 87bpm, reports this subsides following rest - Mini squat with bilat UE support 3x 10 with max cuing for proper form and minimal compliance - Standing abd 3x 8 with LLE, attempted with RLE unable d/t L knee pain, cuing for posture and eccentric control; standing  rest breaks between  - Standing heel raises 2x 10 with cuing for full ROM;  Standing rest break between  - Seated abd GTB 3x 10 with cuing for postural set up and eccentric control with good carry over following   Gait Training Over 135ft ambulation with1 seated rest break (during, 1 following) 92min; able to subsequently ambulate 31ft of second lap in 66mins before having "too much pain in L knee" and sitting down, pain subsides following. Able to ambulate 35ft to room following. Pt with better step through pattern for first 170ft, more cuing needed following, as patient is becoming more fatigued. Good                        PT Education - 08/01/18 1438    Education Details  therex form, gait training    Person(s) Educated  Patient    Methods  Explanation;Demonstration;Tactile cues;Verbal cues    Comprehension  Verbalized understanding;Returned demonstration;Verbal cues required;Tactile cues required       PT Short Term Goals - 06/20/18 1653      PT SHORT TERM GOAL #2   Title  Patient will be able to ascend/descend 2 steps without handrail with reciprocol gait, IND,  to demonstrate PLOF and safety entering/exiting home    Baseline  06/19/18 able to ascend 2steps with bilat handrails CGA for safety    Time  4    Period  Weeks    Status  New      PT SHORT TERM GOAL #3   Title  Patient will be able to ambulate 86ft with LRD to demonstrate safety with ambulation over community distances    Baseline   06/19/18 65ft with CGA for safety    Time  4    Period  Weeks    Status  New      PT SHORT TERM GOAL #4   Title  Pt will increase 10MWT to 0.76m/s in order to demonstrate clinically significant improvement in household ambulation.     Baseline  06/19/18 unable to ambulate 51m    Time  4    Period  Weeks    Status  New      PT SHORT TERM GOAL #5   Title   Pt will decrease 5TSTS by at least 3 seconds in order to demonstrate clinically significant improvement in LE  strength    Baseline   06/19/18 18sec    Time  8    Period  Weeks    Status  New        PT Long Term Goals - 06/20/18 1643      PT LONG TERM GOAL #1   Title  Pt will increase  LEFS by at least 9 points in order to demonstrate significant improvement in lower extremity function.     Baseline  06/19/18 7/80    Time  8    Period  Weeks    Status  New      PT LONG TERM GOAL #2   Title  Pt will decrease 5TSTS to 10sec to demonstrate normal LE strength    Baseline  06/19/18 18sec    Time  8    Period  Weeks    Status  New      PT LONG TERM GOAL #3   Title  Pt will decrease worst pain as reported on NPRS by at least 3 points in order to demonstrate clinically significant reduction in ankle/foot pain.     Baseline  06/19/18 8/10    Time  8    Period  Weeks    Status  New      PT LONG TERM GOAL #4   Title  Pt will increase 10MWT to 1.31m/s in order to demonstrate clinically significant improvement in community ambulation.     Baseline  06/19/18 unable to ambulate 43m    Time  8    Period  Weeks    Status  New      PT LONG TERM GOAL #5   Title  Patient will be able to ascend/descend 4 steps without handrail with reciprocol gait, IND, to demonstrate PLOF and safety entering/exiting home    Baseline  06/19/18 able to ascend 2steps with bilat handrails CGA for safety    Time  8    Period  Weeks    Status  New      Additional Long Term Goals   Additional Long Term Goals  Yes      PT LONG TERM GOAL #6   Title  Patient will be able to ambulate 240ft with LRD to demonstrate safety with ambulation over community distances    Baseline  06/19/18 80ft with CGA for safety    Time  8    Period  Weeks    Status  New            Plan - 08/01/18 1440    Clinical Impression Statement  PT continued therex progression, which pt is able to tolerate with encouragement and cuing for proper form. Patient is increasing endurance with walking and stading tolerance throughout session, but requires  encouragement and redirection to stay on task. PT will continue progression as able.    Personal Factors and Comorbidities  Age;Comorbidity 1;Comorbidity 2;Comorbidity 3+;Fitness;Past/Current Experience;Time since onset of injury/illness/exacerbation    Comorbidities  Dementia, CVA, COPD, CHF, DM2, HTN, HL, Afib, asthma    Examination-Activity Limitations  Bathing;Bed Mobility;Dressing;Stairs;Bend;Lift;Locomotion Level;Squat;Carry;Transfers;[place];Interpersonal Relationship;Yard Work;Cleaning;Laundry;Community Activity    Stability/Clinical Decision Making  Evolving/Moderate complexity    Clinical Decision Making  Moderate    Rehab Potential  Fair    PT Frequency  2x / week    PT Duration  8 weeks    PT Treatment/Interventions  ADLs/Self Care Home Management;Cryotherapy;Gait training;Therapeutic exercise;Patient/family education;Manual techniques;Passive range of motion;Joint Manipulations;Spinal Manipulations;Wheelchair mobility training;Neuromuscular re-education;Balance training;Functional mobility training;Electrical Stimulation;Moist Heat;DME Instruction;Therapeutic activities    PT Next Visit Plan  therex progression    PT Home Exercise Plan  STS, seated marching, seated abd RTB    Consulted and Agree with Plan of Care  Patient;Family member/caregiver       Patient will benefit from skilled therapeutic intervention in order to  improve the following deficits and impairments:  Abnormal gait, Decreased balance, Decreased endurance, Decreased mobility, Difficulty walking, Cardiopulmonary status limiting activity, Decreased activity tolerance, Decreased coordination, Decreased strength, Decreased safety awareness, Decreased range of motion, Improper body mechanics, Obesity, Impaired flexibility, Increased fascial restricitons, Postural dysfunction, Pain  Visit Diagnosis: 1. Chronic pain of left knee   2. Chronic pain of right knee   3.  Difficulty in walking, not elsewhere classified        Problem List Patient Active Problem List   Diagnosis Date Noted  . BPH (benign prostatic hyperplasia) 07/18/2018  . Degeneration of lumbar intervertebral disc 07/17/2018  . Osteoarthritis of knee 07/17/2018  . Spinal stenosis of lumbar region 07/17/2018  . Acute CVA (cerebrovascular accident) ([place]) 12/02/2017  . Acute ischemic stroke ([place]) 12/02/2017  . Acute metabolic encephalopathy due to hypoglycemia 11/29/2017  . Essential hypertension 11/29/2017  . Syncope and collapse 11/28/2017  . A-fib ([place]) 05/22/2017  . Chest pain 02/21/2017  . AKI (acute kidney injury) ([place]) 02/21/2017  . SOB (shortness of breath) 02/08/2017  . Atrial fibrillation with RVR ([place]) 02/08/2017  . GI bleeding 08/06/2014  . Anemia 08/06/2014  . Bradycardia 08/06/2014  . Atrial fibrillation ([place]) 08/06/2014  . Hyponatremia 08/06/2014  . Chronic diastolic heart failure ([place]) 08/06/2014  . OSA (obstructive sleep apnea) 08/06/2014  . Memory loss or impairment 05/29/2014  . Type 2 diabetes mellitus, without long-term current use of insulin ([place]) 08/11/2010  . 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 Chronic rhinitis 08/11/2010  . Coronary artery disease 08/11/2010  . Esophageal reflux 08/11/2010  . Personal history of arthritis 08/11/2010  . Tear film insufficiency 06/07/2010  . 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 Keratoconjunctivitis sicca ([place]) 03/10/2010  . Lens replaced 03/10/2010  . Pterygium 03/10/2010   Shelton Silvas PT, DPT Shelton Silvas 08/01/2018, 2:56 PM  [facility] [address], [place], [address] Phone: [phone]   Fax:  [phone]  Name: Derek Shelton MRN: [id number] Date of Birth: April 16, 1940

## 2018-08-06 ENCOUNTER — Ambulatory Visit: Payer: Medicare Other | Admitting: Physical Therapy

## 2018-08-06 ENCOUNTER — Other Ambulatory Visit: Payer: Self-pay

## 2018-08-06 DIAGNOSIS — G8929 Other chronic pain: Secondary | ICD-10-CM

## 2018-08-06 DIAGNOSIS — M25562 Pain in left knee: Secondary | ICD-10-CM

## 2018-08-06 DIAGNOSIS — R262 Difficulty in walking, not elsewhere classified: Secondary | ICD-10-CM

## 2018-08-06 NOTE — Therapy (Signed)
[facility] [address] [place], [address] Phone: [phone]   Fax:  [phone]  Physical Therapy Treatment  Patient Details  Name: Derek Shelton MRN: [id number] Date of Birth: 02/21/1940 Referring Provider (PT): Dorthula Perfect Date: 08/06/2018  PT End of Session - 08/07/18 1324    Visit Number  10    Number of Visits  17    Date for PT Re-Evaluation  08/15/18    PT Start Time  0145    PT Stop Time  0225    PT Time Calculation (min)  40 min    Activity Tolerance  Patient limited by pain    Behavior During Therapy  Flat affect;WFL for tasks assessed/performed       Past Medical History:  Diagnosis Date  . Anemia   . Asthma   . CHF (congestive heart failure) ([place])   . COPD (chronic obstructive pulmonary disease) ([place])   . Coronary artery disease   . Dementia ([place])    Per son's report  . Diabetes mellitus without complication ([place])   . Dysrhythmia    Atrial Fibrillation  . GERD (gastroesophageal reflux disease)   . Hyperlipidemia   . Hypertension   . Hypothyroidism   . Shortness of breath dyspnea   . Sleep apnea   . Thyroid disease     Past Surgical History:  Procedure Laterality Date  . CARDIAC CATHETERIZATION    . COLONOSCOPY N/A 08/10/2014   Procedure: COLONOSCOPY;  Surgeon: Manya Silvas, MD;  Location: [facility] ENDOSCOPY;  Service: Endoscopy;  Laterality: N/A;  . COLONOSCOPY WITH PROPOFOL N/A 04/05/2016   Procedure: COLONOSCOPY WITH PROPOFOL;  Surgeon: Lollie Sails, MD;  Location: [facility] ENDOSCOPY;  Service: Endoscopy;  Laterality: N/A;  . ESOPHAGOGASTRODUODENOSCOPY N/A 08/08/2014   Procedure: ESOPHAGOGASTRODUODENOSCOPY (EGD);  Surgeon: Manya Silvas, MD;  Location: [facility] ENDOSCOPY;  Service: Endoscopy;  Laterality: N/A;  plan for early afternoon  . EYE SURGERY    . GIVENS CAPSULE STUDY N/A 08/12/2014   Procedure: GIVENS CAPSULE STUDY;  Surgeon: Manya Silvas, MD;  Location:  [facility] ENDOSCOPY;  Service: Endoscopy;  Laterality: N/A;  . rectal fistula N/A     There were no vitals filed for this visit.  Subjective Assessment - 08/07/18 1320    Subjective  Patient reports minimal pain    Pertinent History  Interpreter used and wife with patient to give history d/t cognitive impairments (dementia). Patient presenting with bilat knee pain. Pertinent history of CVA 09/2017 with associated \ L sided weakness, has been using WC in community and RW in home since, though wife reports he does not like to use the RW (ambulatory prior). Pt reports bilat knee pain that has been going on for some time with exacerbation recently since he has not been having HH (since Dec). Wife reports no pain at rest, only knee pain with walking/standing. Son helps patient with bathing and dressing, as he cannot stand for longer than 53mins without falling or having pain. Is able to complete bed mobility ind, and chair <> chair transfers IND, and propel his own WC. Patient has stairs to enter and exit home and wife reports his 2 sons carry him in his WC up and down steps. Patient is continent, but uses urinal d/t decreased motivation, and adult diaper to prevent accidents. Reports highest pain level 7/10 over the past week, and best 0/10 with rest.     Limitations  Walking;Lifting;House hold activities;Standing  How long can you sit comfortably?  unlimited    How long can you stand comfortably?  17mins     How long can you walk comfortably?  20mins    Diagnostic tests  None      Gait Training Over151ft ambulation with no rest during, seated rest after 45min;able to subsequently ambulate 66ft in 14mins with 1 seated rest break before not being able to tolerate more ambulate .Pt needs cuing for step through pattern and encouragement to continue walking, but is able to comply with cuing for the most part   76min rest break following both ambulation trials with cuing for deep breathing, as patient reports he  is having difficulty breathing with his mask Stair ambulation: patient able to ascend/descend 4 steps with bilat handrails with step to pattern leading with RLE with ascent and LLE descent, PT attempted to cue patient through reciprocol pattern, which pt is unable to complete d/t increased knee pain. Patient attempted unilateral support with handrail to negotiate stairs and is unable/unwilling. PT demonstrated using bilat UE support on unilateral handrail for ind in situations with 1 handrail (ie getting in and out of his home). Pt attempted setup , but could not lift LE to step without bilat handrails. Once seated, reports he feels like his heart is is racing. Vitals taken below. Pateitn given water and visually reviewed current stair ambulation strategies for safety  BP 121/53  Pulse 73                        PT Education - 08/07/18 1321    Education Details  gait and stair training    Person(s) Educated  Patient    Methods  Explanation;Demonstration;Tactile cues;Verbal cues    Comprehension  Verbalized understanding;Returned demonstration;Verbal cues required;Tactile cues required       PT Short Term Goals - 06/20/18 1653      PT SHORT TERM GOAL #2   Title  Patient will be able to ascend/descend 2 steps without handrail with reciprocol gait, IND,  to demonstrate PLOF and safety entering/exiting home    Baseline  06/19/18 able to ascend 2steps with bilat handrails CGA for safety    Time  4    Period  Weeks    Status  New      PT SHORT TERM GOAL #3   Title  Patient will be able to ambulate 43ft with LRD to demonstrate safety with ambulation over community distances    Baseline   06/19/18 77ft with CGA for safety    Time  4    Period  Weeks    Status  New      PT SHORT TERM GOAL #4   Title  Pt will increase 10MWT to 0.24m/s in order to demonstrate clinically significant improvement in household ambulation.     Baseline  06/19/18 unable to ambulate 51m    Time  4     Period  Weeks    Status  New      PT SHORT TERM GOAL #5   Title   Pt will decrease 5TSTS by at least 3 seconds in order to demonstrate clinically significant improvement in LE strength    Baseline   06/19/18 18sec    Time  8    Period  Weeks    Status  New        PT Long Term Goals - 06/20/18 1643      PT LONG TERM GOAL #1  Title  Pt will increase LEFS by at least 9 points in order to demonstrate significant improvement in lower extremity function.     Baseline  06/19/18 7/80    Time  8    Period  Weeks    Status  New      PT LONG TERM GOAL #2   Title  Pt will decrease 5TSTS to 10sec to demonstrate normal LE strength    Baseline  06/19/18 18sec    Time  8    Period  Weeks    Status  New      PT LONG TERM GOAL #3   Title  Pt will decrease worst pain as reported on NPRS by at least 3 points in order to demonstrate clinically significant reduction in ankle/foot pain.     Baseline  06/19/18 8/10    Time  8    Period  Weeks    Status  New      PT LONG TERM GOAL #4   Title  Pt will increase 10MWT to 1.68m/s in order to demonstrate clinically significant improvement in community ambulation.     Baseline  06/19/18 unable to ambulate 12m    Time  8    Period  Weeks    Status  New      PT LONG TERM GOAL #5   Title  Patient will be able to ascend/descend 4 steps without handrail with reciprocol gait, IND, to demonstrate PLOF and safety entering/exiting home    Baseline  06/19/18 able to ascend 2steps with bilat handrails CGA for safety    Time  8    Period  Weeks    Status  New      Additional Long Term Goals   Additional Long Term Goals  Yes      PT LONG TERM GOAL #6   Title  Patient will be able to ambulate 245ft with LRD to demonstrate safety with ambulation over community distances    Baseline  06/19/18 19ft with CGA for safety    Time  8    Period  Weeks    Status  New            Plan - 08/07/18 1330    Clinical Impression Statement  PT continued gait  progression with some progress. Continued encouragement needed and some cuing, but patient doing much better with stride length. PT attempted to educate patient on stair ambulation, which he is able to complete safely with bilat handrails, but has extreme difficulty with unilateral handrail situations, despite multiple attemps/ways of completing. PT will continue therex progression to ensure strength needed for carry over into ADLs and gait as able.    Personal Factors and Comorbidities  Age;Comorbidity 1;Comorbidity 2;Comorbidity 3+;Fitness;Past/Current Experience;Time since onset of injury/illness/exacerbation    Comorbidities  Dementia, CVA, COPD, CHF, DM2, HTN, HL, Afib, asthma    Examination-Activity Limitations  Bathing;Bed Mobility;Dressing;Stairs;Bend;Lift;Locomotion Level;Squat;Carry;Transfers;[place];Interpersonal Relationship;Yard Work;Cleaning;Laundry;Community Activity    Clinical Decision Making  Moderate    Rehab Potential  Fair    PT Frequency  2x / week    PT Duration  8 weeks    PT Treatment/Interventions  ADLs/Self Care Home Management;Cryotherapy;Gait training;Therapeutic exercise;Patient/family education;Manual techniques;Passive range of motion;Joint Manipulations;Spinal Manipulations;Wheelchair mobility training;Neuromuscular re-education;Balance training;Functional mobility training;Electrical Stimulation;Moist Heat;DME Instruction;Therapeutic activities    PT Next Visit Plan  therex progression    PT Home Exercise Plan  STS, seated marching, seated abd RTB    Consulted and Agree  with Plan of Care  Patient;Family member/caregiver       Patient will benefit from skilled therapeutic intervention in order to improve the following deficits and impairments:  Abnormal gait, Decreased balance, Decreased endurance, Decreased mobility, Difficulty walking, Cardiopulmonary status limiting activity, Decreased activity tolerance,  Decreased coordination, Decreased strength, Decreased safety awareness, Decreased range of motion, Improper body mechanics, Obesity, Impaired flexibility, Increased fascial restricitons, Postural dysfunction, Pain  Visit Diagnosis: 1. Chronic pain of left knee   2. Chronic pain of right knee   3. Difficulty in walking, not elsewhere classified        Problem List Patient Active Problem List   Diagnosis Date Noted  . BPH (benign prostatic hyperplasia) 07/18/2018  . Degeneration of lumbar intervertebral disc 07/17/2018  . Osteoarthritis of knee 07/17/2018  . Spinal stenosis of lumbar region 07/17/2018  . Acute CVA (cerebrovascular accident) ([place]) 12/02/2017  . Acute ischemic stroke ([place]) 12/02/2017  . Acute metabolic encephalopathy due to hypoglycemia 11/29/2017  . Essential hypertension 11/29/2017  . Syncope and collapse 11/28/2017  . A-fib ([place]) 05/22/2017  . Chest pain 02/21/2017  . AKI (acute kidney injury) ([place]) 02/21/2017  . SOB (shortness of breath) 02/08/2017  . Atrial fibrillation with RVR ([place]) 02/08/2017  . GI bleeding 08/06/2014  . Anemia 08/06/2014  . Bradycardia 08/06/2014  . Atrial fibrillation ([place]) 08/06/2014  . Hyponatremia 08/06/2014  . Chronic diastolic heart failure ([place]) 08/06/2014  . OSA (obstructive sleep apnea) 08/06/2014  . Memory loss or impairment 05/29/2014  . Type 2 diabetes mellitus, without long-term current use of insulin ([place]) 08/11/2010  . 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 Chronic rhinitis 08/11/2010  . Coronary artery disease 08/11/2010  . Esophageal reflux 08/11/2010  . Personal history of arthritis 08/11/2010  . Tear film insufficiency 06/07/2010  . 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 Keratoconjunctivitis sicca ([place]) 03/10/2010  . Lens replaced 03/10/2010  . Pterygium 03/10/2010   Shelton Silvas PT, DPT Shelton Silvas 08/07/2018, 2:12 PM  [facility] [address] [place], [address] Phone: [phone]   Fax:  [phone]  Name: KASEM MOZER MRN: [id number] Date of Birth: 05/28/1940

## 2018-08-08 ENCOUNTER — Ambulatory Visit: Payer: Medicare Other | Admitting: Physical Therapy

## 2018-08-08 ENCOUNTER — Encounter: Payer: Self-pay | Admitting: Physical Therapy

## 2018-08-08 ENCOUNTER — Other Ambulatory Visit: Payer: Self-pay

## 2018-08-08 DIAGNOSIS — G8929 Other chronic pain: Secondary | ICD-10-CM

## 2018-08-08 DIAGNOSIS — M25562 Pain in left knee: Secondary | ICD-10-CM

## 2018-08-08 DIAGNOSIS — R262 Difficulty in walking, not elsewhere classified: Secondary | ICD-10-CM

## 2018-08-08 NOTE — Therapy (Signed)
[facility] [address], [place], [address] Phone: [phone]   Fax:  [phone]  Physical Therapy Treatment  Patient Details  Name: Derek Derek MRN: [id number] Date of Birth: [birth year] Referring Provider (PT): Dorthula Perfect Date: 08/08/2018  PT End of Session - 08/08/18 1428    Visit Number  11    Number of Visits  17    Date for PT Re-Evaluation  08/15/18    PT Start Time  0147    PT Stop Time  0225    PT Time Calculation (min)  38 min    Activity Tolerance  Patient limited by pain    Behavior During Therapy  Flat affect;WFL for tasks assessed/performed       Past Medical History:  Diagnosis Date  . Anemia   . Asthma   . CHF (congestive heart failure) ([place])   . COPD (chronic obstructive pulmonary disease) ([place])   . Coronary artery disease   . Dementia ([place])    Per son's report  . Diabetes mellitus without complication ([place])   . Dysrhythmia    Atrial Fibrillation  . GERD (gastroesophageal reflux disease)   . Hyperlipidemia   . Hypertension   . Hypothyroidism   . Shortness of breath dyspnea   . Sleep apnea   . Thyroid disease     Past Surgical History:  Procedure Laterality Date  . CARDIAC CATHETERIZATION    . COLONOSCOPY N/A 08/10/2014   Procedure: COLONOSCOPY;  Surgeon: Manya Silvas, MD;  Location: [facility] ENDOSCOPY;  Service: Endoscopy;  Laterality: N/A;  . COLONOSCOPY WITH PROPOFOL N/A 04/05/2016   Procedure: COLONOSCOPY WITH PROPOFOL;  Surgeon: Lollie Sails, MD;  Location: [facility] ENDOSCOPY;  Service: Endoscopy;  Laterality: N/A;  . ESOPHAGOGASTRODUODENOSCOPY N/A 08/08/2014   Procedure: ESOPHAGOGASTRODUODENOSCOPY (EGD);  Surgeon: Manya Silvas, MD;  Location: [facility] ENDOSCOPY;  Service: Endoscopy;  Laterality: N/A;  plan for early afternoon  . EYE SURGERY    . GIVENS CAPSULE STUDY N/A 08/12/2014   Procedure: GIVENS CAPSULE STUDY;  Surgeon: Manya Silvas, MD;  Location:  [facility] ENDOSCOPY;  Service: Endoscopy;  Laterality: N/A;  . rectal fistula N/A     There were no vitals filed for this visit.  Subjective Assessment - 08/08/18 1417    Subjective  Minimal pain. No compliance with HEP.    Pertinent History  Interpreter used and wife with patient to give history d/t cognitive impairments (dementia). Patient presenting with bilat knee pain. Pertinent history of CVA 09/2017 with associated \ L sided weakness, has been using WC in community and RW in home since, though wife reports he does not like to use the RW (ambulatory prior). Pt reports bilat knee pain that has been going on for some time with exacerbation recently since he has not been having HH (since Dec). Wife reports no pain at rest, only knee pain with walking/standing. Son helps patient with bathing and dressing, as he cannot stand for longer than 87mins without falling or having pain. Is able to complete bed mobility ind, and chair <> chair transfers IND, and propel his own WC. Patient has stairs to enter and exit home and wife reports his 2 sons carry him in his WC up and down steps. Patient is continent, but uses urinal d/t decreased motivation, and adult diaper to prevent accidents. Reports highest pain level 7/10 over the past week, and best 0/10 with rest.     Limitations  Walking;Lifting;House  hold activities;Standing    How long can you sit comfortably?  unlimited    How long can you stand comfortably?  51mins     How long can you walk comfortably?  78mins    Diagnostic tests  None       Gait Training Over13ft ambulation with no rest during, seated rest after68mins; Subsequently able to ambulate 45ft in 70mins  RW; .Pt needs cuing for step through pattern at the end of ambulation and encouragement to continue walking, but is able to comply with cuing for the most part  57min rest break between ambulation trials Ambulation with RW over 62ft stepping over 2 6in hurdles, turning around and completing  again to sit in chair. Demonstration of proper obstacle negotiation prior with picking up RW first, and then stepping over with good carry over. Some assistance needed for RW management during turn around to reverse course Ambulation with RW weaving between 3 cones over 31ft, with 180d turn around last cone to reverse course and complete again. Min cuing to "keep feet inside RW" and TC for RW management with good carry over following Stair ambulation: patient able to ascend/descend 4 steps with bilat handrails with step to pattern leading with RLE with ascent and LLE descent, PT attempted to cue patient through reciprocol pattern, and patient is able to led with LLE for 1 step with step to pattern, but is not able to complete with reciprocol pattern Subsequent trial patient able to complete ascent with reciprocal pattern with visual cuing to step to next step; descent: able to complete 1 stair reciprocal, unable to continue d/t L knee pain, with wide stance  Following patient able to ambulate with RW 10ft out of clinic with decent carry over of proper gait pattern                        PT Education - 08/08/18 1417    Education Details  gait and stair training    Person(s) Educated  Patient    Methods  Explanation;Demonstration;Tactile cues;Verbal cues    Comprehension  Verbalized understanding;Returned demonstration;Verbal cues required;Tactile cues required       PT Short Term Goals - 06/20/18 1653      PT SHORT TERM GOAL #2   Title  Patient will be able to ascend/descend 2 steps without handrail with reciprocol gait, IND,  to demonstrate PLOF and safety entering/exiting home    Baseline  06/19/18 able to ascend 2steps with bilat handrails CGA for safety    Time  4    Period  Weeks    Status  New      PT SHORT TERM GOAL #3   Title  Patient will be able to ambulate 28ft with LRD to demonstrate safety with ambulation over community distances    Baseline   06/19/18 35ft  with CGA for safety    Time  4    Period  Weeks    Status  New      PT SHORT TERM GOAL #4   Title  Pt will increase 10MWT to 0.79m/s in order to demonstrate clinically significant improvement in household ambulation.     Baseline  06/19/18 unable to ambulate 66m    Time  4    Period  Weeks    Status  New      PT SHORT TERM GOAL #5   Title   Pt will decrease 5TSTS by at least 3 seconds in order to demonstrate  clinically significant improvement in LE strength    Baseline   06/19/18 18sec    Time  8    Period  Weeks    Status  New        PT Long Term Goals - 06/20/18 1643      PT LONG TERM GOAL #1   Title  Pt will increase LEFS by at least 9 points in order to demonstrate significant improvement in lower extremity function.     Baseline  06/19/18 7/80    Time  8    Period  Weeks    Status  New      PT LONG TERM GOAL #2   Title  Pt will decrease 5TSTS to 10sec to demonstrate normal LE strength    Baseline  06/19/18 18sec    Time  8    Period  Weeks    Status  New      PT LONG TERM GOAL #3   Title  Pt will decrease worst pain as reported on NPRS by at least 3 points in order to demonstrate clinically significant reduction in ankle/foot pain.     Baseline  06/19/18 8/10    Time  8    Period  Weeks    Status  New      PT LONG TERM GOAL #4   Title  Pt will increase 10MWT to 1.20m/s in order to demonstrate clinically significant improvement in community ambulation.     Baseline  06/19/18 unable to ambulate 26m    Time  8    Period  Weeks    Status  New      PT LONG TERM GOAL #5   Title  Patient will be able to ascend/descend 4 steps without handrail with reciprocol gait, IND, to demonstrate PLOF and safety entering/exiting home    Baseline  06/19/18 able to ascend 2steps with bilat handrails CGA for safety    Time  8    Period  Weeks    Status  New      Additional Long Term Goals   Additional Long Term Goals  Yes      PT LONG TERM GOAL #6   Title  Patient will be able to  ambulate 27ft with LRD to demonstrate safety with ambulation over community distances    Baseline  06/19/18 54ft with CGA for safety    Time  8    Period  Weeks    Status  New            Plan - 08/08/18 1454    Clinical Impression Statement  PT continued gait progression for distance and with RW negotation over various obstacles. Pt is able to safety negotiate all obstacles with cuing needed for RW management and safety. PT continued to work on stair education and reciprocol gait on steps, which patinet is able to demonstrate improvement with. Good motivation and willingness to participate throughout session, with rest breaks needed d/t knee pain and cardiovascular fitness. PT will continue progression as able.    Personal Factors and Comorbidities  Age;Comorbidity 1;Comorbidity 2;Comorbidity 3+;Fitness;Past/Current Experience;Time since onset of injury/illness/exacerbation    Comorbidities  Dementia, CVA, COPD, CHF, DM2, HTN, HL, Afib, asthma    Examination-Activity Limitations  Bathing;Bed Mobility;Dressing;Stairs;Bend;Lift;Locomotion Level;Squat;Carry;Transfers;Toileting    Stability/Clinical Decision Making  Evolving/Moderate complexity    Clinical Decision Making  Moderate    Rehab Potential  Fair    PT Frequency  2x / week    PT Duration  8  weeks    PT Treatment/Interventions  ADLs/Self Care Home Management;Cryotherapy;Gait training;Therapeutic exercise;Patient/family education;Manual techniques;Passive range of motion;Joint Manipulations;Spinal Manipulations;Wheelchair mobility training;Neuromuscular re-education;Balance training;Functional mobility training;Electrical Stimulation;Moist Heat;DME Instruction;Therapeutic activities    PT Next Visit Plan  therex progression    PT Home Exercise Plan  STS, seated marching, seated abd RTB    Consulted and Agree with Plan of Care  Patient;Family member/caregiver       Patient will benefit from skilled therapeutic intervention in order  to improve the following deficits and impairments:  Abnormal gait, Decreased balance, Decreased endurance, Decreased mobility, Difficulty walking, Cardiopulmonary status limiting activity, Decreased activity tolerance, Decreased coordination, Decreased strength, Decreased safety awareness, Decreased range of motion, Improper body mechanics, Obesity, Impaired flexibility, Increased fascial restricitons, Postural dysfunction, Pain  Visit Diagnosis: 1. Chronic pain of left knee   2. Chronic pain of right knee   3. Difficulty in walking, not elsewhere classified        Problem List Patient Active Problem List   Diagnosis Date Noted  . BPH (benign prostatic hyperplasia) 07/18/2018  . Degeneration of lumbar intervertebral disc 07/17/2018  . Osteoarthritis of knee 07/17/2018  . Spinal stenosis of lumbar region 07/17/2018  . Acute CVA (cerebrovascular accident) ([place]) 12/02/2017  . Acute ischemic stroke ([place]) 12/02/2017  . Acute metabolic encephalopathy due to hypoglycemia 11/29/2017  . Essential hypertension 11/29/2017  . Syncope and collapse 11/28/2017  . A-fib ([place]) 05/22/2017  . Chest pain 02/21/2017  . AKI (acute kidney injury) ([place]) 02/21/2017  . SOB (shortness of breath) 02/08/2017  . Atrial fibrillation with RVR ([place]) 02/08/2017  . GI bleeding 08/06/2014  . Anemia 08/06/2014  . Bradycardia 08/06/2014  . Atrial fibrillation ([place]) 08/06/2014  . Hyponatremia 08/06/2014  . Chronic diastolic heart failure ([place]) 08/06/2014  . OSA (obstructive sleep apnea) 08/06/2014  . Memory loss or impairment 05/29/2014  . Type 2 diabetes mellitus, without long-term current use of insulin ([place]) 08/11/2010  . 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 Chronic rhinitis 08/11/2010  . Coronary artery disease 08/11/2010  . Esophageal reflux 08/11/2010  . Personal history of arthritis 08/11/2010  . Tear film insufficiency 06/07/2010  . 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 Keratoconjunctivitis  sicca ([place]) 03/10/2010  . Lens replaced 03/10/2010  . Pterygium 03/10/2010   Derek Derek PT, DPT Derek Derek 08/08/2018, 3:03 PM  [facility] [address], [place], [address] Phone: [phone]   Fax:  [phone]  Name: Derek Derek MRN: [id number] Date of Birth: 21-Sep-1940

## 2018-08-13 ENCOUNTER — Ambulatory Visit: Payer: Medicare Other | Admitting: Physical Therapy

## 2018-08-13 ENCOUNTER — Encounter: Payer: Self-pay | Admitting: Physical Therapy

## 2018-08-13 ENCOUNTER — Other Ambulatory Visit: Payer: Self-pay

## 2018-08-13 DIAGNOSIS — M25561 Pain in right knee: Secondary | ICD-10-CM

## 2018-08-13 DIAGNOSIS — M25562 Pain in left knee: Secondary | ICD-10-CM

## 2018-08-13 DIAGNOSIS — R262 Difficulty in walking, not elsewhere classified: Secondary | ICD-10-CM

## 2018-08-13 DIAGNOSIS — G8929 Other chronic pain: Secondary | ICD-10-CM

## 2018-08-13 NOTE — Therapy (Signed)
[facility] [address] [place], [address] Phone: [phone]   Fax:  [phone]  Physical Therapy Treatment  Patient Details  Name: Derek Shelton MRN: [id number] Date of Birth: 23-Jul-1940 Referring Provider (PT): Dorthula Perfect Date: 08/13/2018  PT End of Session - 08/13/18 1404    Visit Number  12    Number of Visits  17    Date for PT Re-Evaluation  08/15/18    PT Start Time  0147    PT Stop Time  0228    PT Time Calculation (min)  41 min    Activity Tolerance  Patient limited by pain    Behavior During Therapy  Flat affect;WFL for tasks assessed/performed       Past Medical History:  Diagnosis Date  . Anemia   . Asthma   . CHF (congestive heart failure) ([place])   . COPD (chronic obstructive pulmonary disease) ([place])   . Coronary artery disease   . Dementia ([place])    Per son's report  . Diabetes mellitus without complication ([place])   . Dysrhythmia    Atrial Fibrillation  . GERD (gastroesophageal reflux disease)   . Hyperlipidemia   . Hypertension   . Hypothyroidism   . Shortness of breath dyspnea   . Sleep apnea   . Thyroid disease     Past Surgical History:  Procedure Laterality Date  . CARDIAC CATHETERIZATION    . COLONOSCOPY N/A 08/10/2014   Procedure: COLONOSCOPY;  Surgeon: Manya Silvas, MD;  Location: [facility] ENDOSCOPY;  Service: Endoscopy;  Laterality: N/A;  . COLONOSCOPY WITH PROPOFOL N/A 04/05/2016   Procedure: COLONOSCOPY WITH PROPOFOL;  Surgeon: Lollie Sails, MD;  Location: [facility] ENDOSCOPY;  Service: Endoscopy;  Laterality: N/A;  . ESOPHAGOGASTRODUODENOSCOPY N/A 08/08/2014   Procedure: ESOPHAGOGASTRODUODENOSCOPY (EGD);  Surgeon: Manya Silvas, MD;  Location: [facility] ENDOSCOPY;  Service: Endoscopy;  Laterality: N/A;  plan for early afternoon  . EYE SURGERY    . GIVENS CAPSULE STUDY N/A 08/12/2014   Procedure: GIVENS CAPSULE STUDY;  Surgeon: Manya Silvas, MD;  Location:  [facility] ENDOSCOPY;  Service: Endoscopy;  Laterality: N/A;  . rectal fistula N/A     There were no vitals filed for this visit.  Subjective Assessment - 08/13/18 1348    Subjective  L knee pain today. No compliance with HEP.    Pertinent History  Interpreter used and wife with patient to give history d/t cognitive impairments (dementia). Patient presenting with bilat knee pain. Pertinent history of CVA 09/2017 with associated \ L sided weakness, has been using WC in community and RW in home since, though wife reports he does not like to use the RW (ambulatory prior). Pt reports bilat knee pain that has been going on for some time with exacerbation recently since he has not been having HH (since Dec). Wife reports no pain at rest, only knee pain with walking/standing. Son helps patient with bathing and dressing, as he cannot stand for longer than 38mins without falling or having pain. Is able to complete bed mobility ind, and chair <> chair transfers IND, and propel his own WC. Patient has stairs to enter and exit home and wife reports his 2 sons carry him in his WC up and down steps. Patient is continent, but uses urinal d/t decreased motivation, and adult diaper to prevent accidents. Reports highest pain level 7/10 over the past week, and best 0/10 with rest.     How  long can you sit comfortably?  unlimited    How long can you stand comfortably?  5mins     How long can you walk comfortably?  69mins    Diagnostic tests  None       Gait Training Patient able to ambulate 9ft with seated rest break needed d/t L knee pain in 49mins; Subsequent 57ft in 4 mins at the end of session.Ptneeds cuing for step through pattern with increased LLE step, and heel strike. Decent carry over of these cues until fatigue, where patient is unable to maintain them.     Ther-Ex - Standing without UE support 30sec - Standing narrow BOS without UE support 13sec before "too much L knee and LBP - Standing foam without UE  support 17sec All static balance with CGA for safety  - Side stepping with RW 2x 5 each direction with increased pain with stepping to L side; good carry over following dmeo and cuing, guarding for safety - Seated hip flex with feet on 4in step for true hip flex, cuing to sit on edge of chair and maintain upright posture - Seated knee ext 3# AW 3x 10 with TC target cuing for full ROM and encouragement needed for full ROM                    PT Education - 08/13/18 1404    Education Details  gait training, therex form    Person(s) Educated  Patient    Methods  Explanation;Demonstration;Tactile cues;Verbal cues    Comprehension  Verbalized understanding;Returned demonstration;Verbal cues required;Tactile cues required       PT Short Term Goals - 06/20/18 1653      PT SHORT TERM GOAL #2   Title  Patient will be able to ascend/descend 2 steps without handrail with reciprocol gait, IND,  to demonstrate PLOF and safety entering/exiting home    Baseline  06/19/18 able to ascend 2steps with bilat handrails CGA for safety    Time  4    Period  Weeks    Status  New      PT SHORT TERM GOAL #3   Title  Patient will be able to ambulate 67ft with LRD to demonstrate safety with ambulation over community distances    Baseline   06/19/18 72ft with CGA for safety    Time  4    Period  Weeks    Status  New      PT SHORT TERM GOAL #4   Title  Pt will increase 10MWT to 0.82m/s in order to demonstrate clinically significant improvement in household ambulation.     Baseline  06/19/18 unable to ambulate 35m    Time  4    Period  Weeks    Status  New      PT SHORT TERM GOAL #5   Title   Pt will decrease 5TSTS by at least 3 seconds in order to demonstrate clinically significant improvement in LE strength    Baseline   06/19/18 18sec    Time  8    Period  Weeks    Status  New        PT Long Term Goals - 06/20/18 1643      PT LONG TERM GOAL #1   Title  Pt will increase LEFS by at  least 9 points in order to demonstrate significant improvement in lower extremity function.     Baseline  06/19/18 7/80    Time  8  Period  Weeks    Status  New      PT LONG TERM GOAL #2   Title  Pt will decrease 5TSTS to 10sec to demonstrate normal LE strength    Baseline  06/19/18 18sec    Time  8    Period  Weeks    Status  New      PT LONG TERM GOAL #3   Title  Pt will decrease worst pain as reported on NPRS by at least 3 points in order to demonstrate clinically significant reduction in ankle/foot pain.     Baseline  06/19/18 8/10    Time  8    Period  Weeks    Status  New      PT LONG TERM GOAL #4   Title  Pt will increase 10MWT to 1.38m/s in order to demonstrate clinically significant improvement in community ambulation.     Baseline  06/19/18 unable to ambulate 63m    Time  8    Period  Weeks    Status  New      PT LONG TERM GOAL #5   Title  Patient will be able to ascend/descend 4 steps without handrail with reciprocol gait, IND, to demonstrate PLOF and safety entering/exiting home    Baseline  06/19/18 able to ascend 2steps with bilat handrails CGA for safety    Time  8    Period  Weeks    Status  New      Additional Long Term Goals   Additional Long Term Goals  Yes      PT LONG TERM GOAL #6   Title  Patient will be able to ambulate 244ft with LRD to demonstrate safety with ambulation over community distances    Baseline  06/19/18 75ft with CGA for safety    Time  8    Period  Weeks    Status  New            Plan - 08/13/18 1547    Clinical Impression Statement  Patient with increased L knee pain with ambulation and standing. PT modified activity within patients pain limitations, using seated exercises as a modification for strengthening without pain. PT will continue progression as able.    Personal Factors and Comorbidities  Age;Comorbidity 1;Comorbidity 2;Comorbidity 3+;Fitness;Past/Current Experience;Time since onset of  injury/illness/exacerbation;Profession    Comorbidities  Dementia, CVA, COPD, CHF, DM2, HTN, HL, Afib, asthma    Examination-Activity Limitations  Bathing;Bed Mobility;Dressing;Stairs;Bend;Lift;Locomotion Level;Squat;Carry;Transfers;[place];Interpersonal Relationship;Yard Work;Cleaning;Laundry;Community Activity    Stability/Clinical Decision Making  Evolving/Moderate complexity    Clinical Decision Making  Moderate    Rehab Potential  Fair    PT Frequency  2x / week    PT Duration  8 weeks    PT Treatment/Interventions  ADLs/Self Care Home Management;Cryotherapy;Gait training;Therapeutic exercise;Patient/family education;Manual techniques;Passive range of motion;Joint Manipulations;Spinal Manipulations;Wheelchair mobility training;Neuromuscular re-education;Balance training;Functional mobility training;Electrical Stimulation;Moist Heat;DME Instruction;Therapeutic activities    PT Next Visit Plan  therex progression    PT Home Exercise Plan  STS, seated marching, seated abd RTB    Consulted and Agree with Plan of Care  Patient;Family member/caregiver       Patient will benefit from skilled therapeutic intervention in order to improve the following deficits and impairments:  Abnormal gait, Decreased balance, Decreased endurance, Decreased mobility, Difficulty walking, Cardiopulmonary status limiting activity, Decreased activity tolerance, Decreased coordination, Decreased strength, Decreased safety awareness, Decreased range of motion, Improper body mechanics, Obesity, Impaired flexibility, Increased fascial  restricitons, Postural dysfunction, Pain  Visit Diagnosis: 1. Chronic pain of left knee   2. Chronic pain of right knee   3. Difficulty in walking, not elsewhere classified        Problem List Patient Active Problem List   Diagnosis Date Noted  . BPH (benign prostatic hyperplasia) 07/18/2018  . Degeneration of lumbar intervertebral  disc 07/17/2018  . Osteoarthritis of knee 07/17/2018  . Spinal stenosis of lumbar region 07/17/2018  . Acute CVA (cerebrovascular accident) ([place]) 12/02/2017  . Acute ischemic stroke ([place]) 12/02/2017  . Acute metabolic encephalopathy due to hypoglycemia 11/29/2017  . Essential hypertension 11/29/2017  . Syncope and collapse 11/28/2017  . A-fib ([place]) 05/22/2017  . Chest pain 02/21/2017  . AKI (acute kidney injury) ([place]) 02/21/2017  . SOB (shortness of breath) 02/08/2017  . Atrial fibrillation with RVR ([place]) 02/08/2017  . GI bleeding 08/06/2014  . Anemia 08/06/2014  . Bradycardia 08/06/2014  . Atrial fibrillation ([place]) 08/06/2014  . Hyponatremia 08/06/2014  . Chronic diastolic heart failure ([place]) 08/06/2014  . OSA (obstructive sleep apnea) 08/06/2014  . Memory loss or impairment 05/29/2014  . Type 2 diabetes mellitus, without long-term current use of insulin ([place]) 08/11/2010  . 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 Chronic rhinitis 08/11/2010  . Coronary artery disease 08/11/2010  . Esophageal reflux 08/11/2010  . Personal history of arthritis 08/11/2010  . Tear film insufficiency 06/07/2010  . 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 Keratoconjunctivitis sicca ([place]) 03/10/2010  . Lens replaced 03/10/2010  . Pterygium 03/10/2010   Shelton Silvas PT, DPT Shelton Silvas 08/13/2018, 4:01 PM  [facility] [address], [place], [address] Phone: [phone]   Fax:  [phone]  Name: Derek Shelton MRN: [id number] Date of Birth: January 16, 1941

## 2018-08-15 ENCOUNTER — Ambulatory Visit: Payer: Medicare Other | Admitting: Physical Therapy

## 2018-08-15 ENCOUNTER — Encounter: Payer: Self-pay | Admitting: Physical Therapy

## 2018-08-15 ENCOUNTER — Other Ambulatory Visit: Payer: Self-pay

## 2018-08-15 DIAGNOSIS — G8929 Other chronic pain: Secondary | ICD-10-CM

## 2018-08-15 DIAGNOSIS — R262 Difficulty in walking, not elsewhere classified: Secondary | ICD-10-CM

## 2018-08-15 DIAGNOSIS — M25562 Pain in left knee: Secondary | ICD-10-CM | POA: Diagnosis not present

## 2018-08-15 NOTE — Therapy (Signed)
[facility] [address] [place], [address] Phone: [phone]   Fax:  [phone]  Physical Therapy Treatment  Patient Details  Name: Derek Shelton MRN: [id number] Date of Birth: [birth year] Referring Provider (PT): Dorthula Perfect Date: 08/15/2018  PT End of Session - 08/15/18 1417    Visit Number  13    Number of Visits  17    Date for PT Re-Evaluation  08/15/18    PT Start Time  0155    PT Stop Time  0226    PT Time Calculation (min)  31 min    Activity Tolerance  Patient limited by pain    Behavior During Therapy  Flat affect;WFL for tasks assessed/performed       Past Medical History:  Diagnosis Date  . Anemia   . Asthma   . CHF (congestive heart failure) ([place])   . COPD (chronic obstructive pulmonary disease) ([place])   . Coronary artery disease   . Dementia ([place])    Per son's report  . Diabetes mellitus without complication ([place])   . Dysrhythmia    Atrial Fibrillation  . GERD (gastroesophageal reflux disease)   . Hyperlipidemia   . Hypertension   . Hypothyroidism   . Shortness of breath dyspnea   . Sleep apnea   . Thyroid disease     Past Surgical History:  Procedure Laterality Date  . CARDIAC CATHETERIZATION    . COLONOSCOPY N/A 08/10/2014   Procedure: COLONOSCOPY;  Surgeon: Manya Silvas, MD;  Location: [facility] ENDOSCOPY;  Service: Endoscopy;  Laterality: N/A;  . COLONOSCOPY WITH PROPOFOL N/A 04/05/2016   Procedure: COLONOSCOPY WITH PROPOFOL;  Surgeon: Lollie Sails, MD;  Location: [facility] ENDOSCOPY;  Service: Endoscopy;  Laterality: N/A;  . ESOPHAGOGASTRODUODENOSCOPY N/A 08/08/2014   Procedure: ESOPHAGOGASTRODUODENOSCOPY (EGD);  Surgeon: Manya Silvas, MD;  Location: [facility] ENDOSCOPY;  Service: Endoscopy;  Laterality: N/A;  plan for early afternoon  . EYE SURGERY    . GIVENS CAPSULE STUDY N/A 08/12/2014   Procedure: GIVENS CAPSULE STUDY;  Surgeon: Manya Silvas, MD;  Location:  [facility] ENDOSCOPY;  Service: Endoscopy;  Laterality: N/A;  . rectal fistula N/A     There were no vitals filed for this visit.  Subjective Assessment - 08/15/18 1415    Subjective  Reports no pain today. No compliance with HEP.    Pertinent History  Interpreter used and wife with patient to give history d/t cognitive impairments (dementia). Patient presenting with bilat knee pain. Pertinent history of CVA 09/2017 with associated \ L sided weakness, has been using WC in community and RW in home since, though wife reports he does not like to use the RW (ambulatory prior). Pt reports bilat knee pain that has been going on for some time with exacerbation recently since he has not been having HH (since Dec). Wife reports no pain at rest, only knee pain with walking/standing. Son helps patient with bathing and dressing, as he cannot stand for longer than 73mins without falling or having pain. Is able to complete bed mobility ind, and chair <> chair transfers IND, and propel his own WC. Patient has stairs to enter and exit home and wife reports his 2 sons carry him in his WC up and down steps. Patient is continent, but uses urinal d/t decreased motivation, and adult diaper to prevent accidents. Reports highest pain level 7/10 over the past week, and best 0/10 with rest.     Limitations  Walking;Lifting;House hold activities;Standing    How long can you sit comfortably?  unlimited    How long can you stand comfortably?  24mins     How long can you walk comfortably?  57mins    Diagnostic tests  None         Gait Training Patient able to ambulate 38ft in 63mins 45sec before having to sit down d/t L knee pain; Subsequent 33ft in 79mins;.Subsequent 17ft in 7mins before needing to sit again.Ptneeds cuing for step through patternwith increased LLE step, and heel strike. Decent carry over of these cues until fatigue. Demonstration during seated rest break on utilizing RW to reduce pressure through LLE with little  carry over    Ther-Ex - Standing with UE support at RW cone taps 3x 5/5/3 alternating taps with cuing for light tap to "not knock cone over" and eccentric control with good carry over following; increased UE WB with RLE tapping d/t difficiulty and pain standing on LLE - STS 3x 5 without RW, pushing off armrests with cuing for full stand with full hip ext (fastest time 16sec)                        PT Education - 08/15/18 1415    Education Details  therex form, gait training    Person(s) Educated  Patient    Methods  Explanation;Demonstration;Tactile cues;Verbal cues    Comprehension  Verbalized understanding;Returned demonstration;Verbal cues required;Tactile cues required       PT Short Term Goals - 06/20/18 1653      PT SHORT TERM GOAL #2   Title  Patient will be able to ascend/descend 2 steps without handrail with reciprocol gait, IND,  to demonstrate PLOF and safety entering/exiting home    Baseline  06/19/18 able to ascend 2steps with bilat handrails CGA for safety    Time  4    Period  Weeks    Status  New      PT SHORT TERM GOAL #3   Title  Patient will be able to ambulate 17ft with LRD to demonstrate safety with ambulation over community distances    Baseline   06/19/18 47ft with CGA for safety    Time  4    Period  Weeks    Status  New      PT SHORT TERM GOAL #4   Title  Pt will increase 10MWT to 0.1m/s in order to demonstrate clinically significant improvement in household ambulation.     Baseline  06/19/18 unable to ambulate 2m    Time  4    Period  Weeks    Status  New      PT SHORT TERM GOAL #5   Title   Pt will decrease 5TSTS by at least 3 seconds in order to demonstrate clinically significant improvement in LE strength    Baseline   06/19/18 18sec    Time  8    Period  Weeks    Status  New        PT Long Term Goals - 06/20/18 1643      PT LONG TERM GOAL #1   Title  Pt will increase LEFS by at least 9 points in order to demonstrate  significant improvement in lower extremity function.     Baseline  06/19/18 7/80    Time  8    Period  Weeks    Status  New      PT LONG TERM GOAL #  2   Title  Pt will decrease 5TSTS to 10sec to demonstrate normal LE strength    Baseline  06/19/18 18sec    Time  8    Period  Weeks    Status  New      PT LONG TERM GOAL #3   Title  Pt will decrease worst pain as reported on NPRS by at least 3 points in order to demonstrate clinically significant reduction in ankle/foot pain.     Baseline  06/19/18 8/10    Time  8    Period  Weeks    Status  New      PT LONG TERM GOAL #4   Title  Pt will increase 10MWT to 1.17m/s in order to demonstrate clinically significant improvement in community ambulation.     Baseline  06/19/18 unable to ambulate 28m    Time  8    Period  Weeks    Status  New      PT LONG TERM GOAL #5   Title  Patient will be able to ascend/descend 4 steps without handrail with reciprocol gait, IND, to demonstrate PLOF and safety entering/exiting home    Baseline  06/19/18 able to ascend 2steps with bilat handrails CGA for safety    Time  8    Period  Weeks    Status  New      Additional Long Term Goals   Additional Long Term Goals  Yes      PT LONG TERM GOAL #6   Title  Patient will be able to ambulate 220ft with LRD to demonstrate safety with ambulation over community distances    Baseline  06/19/18 33ft with CGA for safety    Time  8    Period  Weeks    Status  New            Plan - 08/15/18 1430    Clinical Impression Statement  Session shortened d/t patient being late. Patient requires more seated rest during ambulation distance this week than previously. Is able to complete entire walk around the gym, but has to take 3 seated rests d/t increased L knee pain. pt unable to comply with cuing to ease pain, using UE WB when in L SLS/R swing phase of gait. PT will conitnue to increase gait distance as able. Patient able to complete therex with accuracy following cuing  and demonstration from PT. PT will reassess progress next visit.    Personal Factors and Comorbidities  Age;Comorbidity 1;Comorbidity 2;Comorbidity 3+;Fitness;Past/Current Experience;Time since onset of injury/illness/exacerbation;Profession    Comorbidities  Dementia, CVA, COPD, CHF, DM2, HTN, HL, Afib, asthma    Examination-Activity Limitations  Bathing;Bed Mobility;Dressing;Stairs;Bend;Lift;Locomotion Level;Squat;Carry;Transfers;[place];Interpersonal Relationship;Yard Work;Cleaning;Laundry;Community Activity    Stability/Clinical Decision Making  Evolving/Moderate complexity    Clinical Decision Making  Moderate    Rehab Potential  Fair    PT Duration  8 weeks    PT Treatment/Interventions  ADLs/Self Care Home Management;Cryotherapy;Gait training;Therapeutic exercise;Patient/family education;Manual techniques;Passive range of motion;Joint Manipulations;Spinal Manipulations;Wheelchair mobility training;Neuromuscular re-education;Balance training;Functional mobility training;Electrical Stimulation;Moist Heat;DME Instruction;Therapeutic activities    PT Next Visit Plan  therex progression    PT Home Exercise Plan  STS, seated marching, seated abd RTB    Consulted and Agree with Plan of Care  Patient;Family member/caregiver       Patient will benefit from skilled therapeutic intervention in order to improve the following deficits and impairments:  Abnormal gait, Decreased balance, Decreased endurance, Decreased mobility, Difficulty  walking, Cardiopulmonary status limiting activity, Decreased activity tolerance, Decreased coordination, Decreased strength, Decreased safety awareness, Decreased range of motion, Improper body mechanics, Obesity, Impaired flexibility, Increased fascial restricitons, Postural dysfunction, Pain  Visit Diagnosis: 1. Chronic pain of left knee   2. Chronic pain of right knee   3. Difficulty in walking, not elsewhere  classified        Problem List Patient Active Problem List   Diagnosis Date Noted  . BPH (benign prostatic hyperplasia) 07/18/2018  . Degeneration of lumbar intervertebral disc 07/17/2018  . Osteoarthritis of knee 07/17/2018  . Spinal stenosis of lumbar region 07/17/2018  . Acute CVA (cerebrovascular accident) ([place]) 12/02/2017  . Acute ischemic stroke ([place]) 12/02/2017  . Acute metabolic encephalopathy due to hypoglycemia 11/29/2017  . Essential hypertension 11/29/2017  . Syncope and collapse 11/28/2017  . A-fib ([place]) 05/22/2017  . Chest pain 02/21/2017  . AKI (acute kidney injury) ([place]) 02/21/2017  . SOB (shortness of breath) 02/08/2017  . Atrial fibrillation with RVR ([place]) 02/08/2017  . GI bleeding 08/06/2014  . Anemia 08/06/2014  . Bradycardia 08/06/2014  . Atrial fibrillation ([place]) 08/06/2014  . Hyponatremia 08/06/2014  . Chronic diastolic heart failure ([place]) 08/06/2014  . OSA (obstructive sleep apnea) 08/06/2014  . Memory loss or impairment 05/29/2014  . Type 2 diabetes mellitus, without long-term current use of insulin ([place]) 08/11/2010  . 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 Chronic rhinitis 08/11/2010  . Coronary artery disease 08/11/2010  . Esophageal reflux 08/11/2010  . Personal history of arthritis 08/11/2010  . Tear film insufficiency 06/07/2010  . 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 Keratoconjunctivitis sicca ([place]) 03/10/2010  . Lens replaced 03/10/2010  . Pterygium 03/10/2010   Shelton Silvas PT, DPT Shelton Silvas 08/15/2018, 2:33 PM  [facility] [address], [place], [address] Phone: [phone]   Fax:  [phone]  Name: ANKIT DEGREGORIO MRN: [id number] Date of Birth: 09-28-1940

## 2018-08-20 ENCOUNTER — Ambulatory Visit: Payer: Medicare Other | Admitting: Physical Therapy

## 2018-08-22 ENCOUNTER — Ambulatory Visit: Payer: Medicare Other | Admitting: Physical Therapy

## 2018-08-27 ENCOUNTER — Other Ambulatory Visit: Payer: Self-pay

## 2018-08-27 ENCOUNTER — Ambulatory Visit: Payer: Medicare Other | Attending: Internal Medicine | Admitting: Physical Therapy

## 2018-08-27 ENCOUNTER — Encounter: Payer: Self-pay | Admitting: Physical Therapy

## 2018-08-27 DIAGNOSIS — M25562 Pain in left knee: Secondary | ICD-10-CM | POA: Diagnosis present

## 2018-08-27 DIAGNOSIS — M25561 Pain in right knee: Secondary | ICD-10-CM | POA: Diagnosis present

## 2018-08-27 DIAGNOSIS — G8929 Other chronic pain: Secondary | ICD-10-CM | POA: Insufficient documentation

## 2018-08-27 DIAGNOSIS — R262 Difficulty in walking, not elsewhere classified: Secondary | ICD-10-CM | POA: Diagnosis present

## 2018-08-27 NOTE — Therapy (Signed)
[facility] [address] [place], [address] Phone: [phone]   Fax:  [phone]  Physical Therapy Treatment  Patient Details  Name: Derek Shelton MRN: [id number] Date of Birth: December 02, 1940 Referring Provider (PT): Dorthula Perfect Date: 08/27/2018  PT End of Session - 08/27/18 1449    Visit Number  14    Number of Visits  29    Date for PT Re-Evaluation  10/08/18    PT Start Time  0145    PT Stop Time  0230    PT Time Calculation (min)  45 min    Activity Tolerance  Patient limited by pain    Behavior During Therapy  Flat affect;WFL for tasks assessed/performed      No notes on file  Physical Therapy Progress Note   Dates of reporting period  06/19/18    to   08/27/18  Past Medical History:  Diagnosis Date  . Anemia   . Asthma   . CHF (congestive heart failure) ([place])   . COPD (chronic obstructive pulmonary disease) ([place])   . Coronary artery disease   . Dementia ([place])    Per son's report  . Diabetes mellitus without complication ([place])   . Dysrhythmia    Atrial Fibrillation  . GERD (gastroesophageal reflux disease)   . Hyperlipidemia   . Hypertension   . Hypothyroidism   . Shortness of breath dyspnea   . Sleep apnea   . Thyroid disease     Past Surgical History:  Procedure Laterality Date  . CARDIAC CATHETERIZATION    . COLONOSCOPY N/A 08/10/2014   Procedure: COLONOSCOPY;  Surgeon: Manya Silvas, MD;  Location: [facility] ENDOSCOPY;  Service: Endoscopy;  Laterality: N/A;  . COLONOSCOPY WITH PROPOFOL N/A 04/05/2016   Procedure: COLONOSCOPY WITH PROPOFOL;  Surgeon: Lollie Sails, MD;  Location: [facility] ENDOSCOPY;  Service: Endoscopy;  Laterality: N/A;  . ESOPHAGOGASTRODUODENOSCOPY N/A 08/08/2014   Procedure: ESOPHAGOGASTRODUODENOSCOPY (EGD);  Surgeon: Manya Silvas, MD;  Location: [facility] ENDOSCOPY;  Service: Endoscopy;  Laterality: N/A;  plan for early afternoon  . EYE SURGERY    . GIVENS  CAPSULE STUDY N/A 08/12/2014   Procedure: GIVENS CAPSULE STUDY;  Surgeon: Manya Silvas, MD;  Location: [facility] ENDOSCOPY;  Service: Endoscopy;  Laterality: N/A;  . rectal fistula N/A     There were no vitals filed for this visit.  Subjective Assessment - 08/27/18 1451    Subjective  Continued minimal HEP compiance. Son reports wife does a lot for patient at home. Some L knee pain today.    Pertinent History  Interpreter used and wife with patient to give history d/t cognitive impairments (dementia). Patient presenting with bilat knee pain. Pertinent history of CVA 09/2017 with associated \ L sided weakness, has been using WC in community and RW in home since, though wife reports he does not like to use the RW (ambulatory prior). Pt reports bilat knee pain that has been going on for some time with exacerbation recently since he has not been having HH (since Dec). Wife reports no pain at rest, only knee pain with walking/standing. Son helps patient with bathing and dressing, as he cannot stand for longer than 53mins without falling or having pain. Is able to complete bed mobility ind, and chair <> chair transfers IND, and propel his own WC. Patient has stairs to enter and exit home and wife reports his 2 sons carry him in his WC up and down  steps. Patient is continent, but uses urinal d/t decreased motivation, and adult diaper to prevent accidents. Reports highest pain level 7/10 over the past week, and best 0/10 with rest.     Limitations  Walking;Lifting;House hold activities;Standing    How long can you sit comfortably?  unlimited    How long can you stand comfortably?  72mins     How long can you walk comfortably?  82mins    Diagnostic tests  None       Therapeutic Activity:  2 trials of stair negotiation with patient unable to complete initially placing LLE on step first. Following PT cuing for LLE ascent, patient able to complete first trial with bilat handrails and RLE step to for ascent and RLE  step to for descent. Patient able to complete second trial with unilateral handrail with bilat handrail support. Rest breaks needed between, demo prior to trials with good carry over. CGA for safety Gait speed, patient able to demonstrate 0.64m/s following some cuing for step through pattern and subsequently continuing walking with some encouragement to complete 71ft altogether, following a rest break patient able to complete 177ft with RW and only supervision needed for safety 5xSTS x2 trials with encouragement needed for speed, and cuing needed initially for proper use of armrests and controled descent to sit with good carry over with safe lower.  Step over with RW 3 cones (down and back, totaling 6 obstacle negotiations) with good carry over of tehnique from previous sessions, needing only min cuing for set up, CGA for safety Cone negotiation with RW around 3 cones, then performing a 180d turn and weaving back through 3 cones,. Good carry over from previous session with some cuing for RW management (esp around 180d turn) Education to son and patient on pain as a multimodial experience and impact of motion, sleep health, life stress, etc. On pain. Encouraged patient to walk between rooms in house with RW, as son reports patient's wife "takes care of him and could not imagine him walking around clinic". Education on need for consistency for carry over of strength and endurance gains. Education to both on progress made thus far (LE strength, decreased pain overall with remaining L knee pain, endurance, and safety), and things patient could continue to improve on ( walking speed, distance, and strength). Education on OA pain, anatomy and pathology involved, and weight bearing to increase bone health                          PT Education - 08/27/18 1450    Education Details  Progress, and further points of improvement, HEP    Person(s) Educated  Patient;Child(ren)    Methods   Explanation;Demonstration    Comprehension  Verbalized understanding       PT Short Term Goals - 08/27/18 1354      PT SHORT TERM GOAL #1   Title  Pt will be independent with HEP in order to decrease ankle pain and increase strength in order to improve pain-free function at home and work.     Baseline  08/27/18 Not completing HEP    Time  4    Period  Weeks    Status  New      PT SHORT TERM GOAL #2   Title  Patient will be able to ascend/descend 2 steps with one handrail, modI to demonstrate PLOF and safety entering/exiting home    Baseline  08/27/18 ascend and descend 4 steps with  1 handrail, with step to pattern, CGA for safety    Time  4    Period  Weeks    Status  Revised      PT SHORT TERM GOAL #3   Title  Patient will be able to ambulate 57ft with LRD to demonstrate safety with ambulation over community distances    Baseline  08/27/18 15ft with RW    Time  4    Period  Weeks    Status  Achieved      PT SHORT TERM GOAL #4   Title  Pt will increase 10MWT to 0.84m/s in order to demonstrate clinically significant improvement in household ambulation.     Baseline  08/26/28 0.31m/s    Time  4    Period  Weeks    Status  On-going      PT SHORT TERM GOAL #5   Title   Pt will decrease 5TSTS by at least 3 seconds in order to demonstrate clinically significant improvement in LE strength    Baseline  08/27/18 13sec    Time  8    Period  Weeks    Status  Achieved        PT Long Term Goals - 08/27/18 1405      PT LONG TERM GOAL #1   Title  Pt will increase LEFS by at least 9 points in order to demonstrate significant improvement in lower extremity function.     Baseline  08/27/18 15/80    Time  8    Period  Weeks    Status  On-going      PT LONG TERM GOAL #2   Title  Pt will decrease 5TSTS to 10sec to demonstrate normal LE strength    Baseline  08/27/18 13sec    Time  8    Period  Weeks    Status  On-going      PT LONG TERM GOAL #3   Title  Pt will decrease worst pain as  reported on NPRS by at least 3 points in order to demonstrate clinically significant reduction in ankle/foot pain.     Baseline  08/27/18 8/10 pain in L knee only    Time  8    Period  Weeks    Status  On-going      PT LONG TERM GOAL #4   Title  Pt will increase 10MWT to 1.67m/s in order to demonstrate clinically significant improvement in community ambulation.     Baseline  08/27/18 0.63m/s    Time  8    Period  Weeks    Status  On-going      PT LONG TERM GOAL #5   Title  Patient will be able to ascend/descend 4 steps without handrail with reciprocol gait, IND, to demonstrate PLOF and safety entering/exiting home    Baseline  08/27/18 able to ascent/descend 4 steps with unilateral handrail, step to pattern ascent RLE/descent LLE, and CGA for safety    Time  8    Period  Weeks    Status  On-going      PT LONG TERM GOAL #6   Title  Patient will be able to ambulate 251ft with LRD to demonstrate safety with ambulation over community distances    Baseline  08/27/18 123ft with supervision    Time  8    Period  Weeks    Status  On-going            Plan - 08/27/18 1517  Clinical Impression Statement  Reassessment completed with son and patient this date. Patient is making slow, steady improvements toward his goals. Patient is walking more, and with faster speed than before, but is still shy of community distance and speed. Patient is able to negotiate stairs, but is not modI with this, and requires CGA and cuing to complet safety. Patient is decreasing fall risk by increasing independence and speed, but is still demonstrating fall risk category with gait speed. Patient has decreased R knee pain, with remaining L knee pain that is inhibiting his independence. Pt and son educated  On progress and continued areas for improvement with understanding. PT expressed importance of carry over at home, and that if family is unable to get patient to complete HEP therex, that they should encourage him to be  independent, especially with walking between rooms; verbalized understanding. Patient will continue to benefit from skilled PT to address impairments for continued steady progress toward independence.    Personal Factors and Comorbidities  Age;Comorbidity 1;Comorbidity 2;Comorbidity 3+;Fitness;Past/Current Experience;Time since onset of injury/illness/exacerbation;Profession    Comorbidities  Dementia, CVA, COPD, CHF, DM2, HTN, HL, Afib, asthma    Examination-Activity Limitations  Bathing;Bed Mobility;Dressing;Stairs;Bend;Lift;Locomotion Level;Squat;Carry;Transfers;[place];Interpersonal Relationship;Yard Work;Cleaning;Laundry;Community Activity    Stability/Clinical Decision Making  Evolving/Moderate complexity    Clinical Decision Making  Moderate    Rehab Potential  Fair    PT Frequency  2x / week    PT Duration  8 weeks    PT Treatment/Interventions  ADLs/Self Care Home Management;Cryotherapy;Gait training;Therapeutic exercise;Patient/family education;Manual techniques;Passive range of motion;Joint Manipulations;Spinal Manipulations;Wheelchair mobility training;Neuromuscular re-education;Balance training;Functional mobility training;Electrical Stimulation;Moist Heat;DME Instruction;Therapeutic activities    PT Next Visit Plan  therex progression    PT Home Exercise Plan  STS, seated marching, seated abd RTB    Consulted and Agree with Plan of Care  Patient       Patient will benefit from skilled therapeutic intervention in order to improve the following deficits and impairments:  Abnormal gait, Decreased balance, Decreased endurance, Decreased mobility, Difficulty walking, Cardiopulmonary status limiting activity, Decreased activity tolerance, Decreased coordination, Decreased strength, Decreased safety awareness, Decreased range of motion, Improper body mechanics, Obesity, Impaired flexibility, Increased fascial restricitons, Postural  dysfunction, Pain  Visit Diagnosis: 1. Chronic pain of left knee   2. Chronic pain of right knee   3. Difficulty in walking, not elsewhere classified        Problem List Patient Active Problem List   Diagnosis Date Noted  . BPH (benign prostatic hyperplasia) 07/18/2018  . Degeneration of lumbar intervertebral disc 07/17/2018  . Osteoarthritis of knee 07/17/2018  . Spinal stenosis of lumbar region 07/17/2018  . Acute CVA (cerebrovascular accident) ([place]) 12/02/2017  . Acute ischemic stroke ([place]) 12/02/2017  . Acute metabolic encephalopathy due to hypoglycemia 11/29/2017  . Essential hypertension 11/29/2017  . Syncope and collapse 11/28/2017  . A-fib ([place]) 05/22/2017  . Chest pain 02/21/2017  . AKI (acute kidney injury) ([place]) 02/21/2017  . SOB (shortness of breath) 02/08/2017  . Atrial fibrillation with RVR ([place]) 02/08/2017  . GI bleeding 08/06/2014  . Anemia 08/06/2014  . Bradycardia 08/06/2014  . Atrial fibrillation ([place]) 08/06/2014  . Hyponatremia 08/06/2014  . Chronic diastolic heart failure ([place]) 08/06/2014  . OSA (obstructive sleep apnea) 08/06/2014  . Memory loss or impairment 05/29/2014  . Type 2 diabetes mellitus, without long-term current use of insulin ([place]) 08/11/2010  . 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 Chronic rhinitis 08/11/2010  . Coronary artery disease 08/11/2010  .  Esophageal reflux 08/11/2010  . Personal history of arthritis 08/11/2010  . Tear film insufficiency 06/07/2010  . 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 Keratoconjunctivitis sicca ([place]) 03/10/2010  . Lens replaced 03/10/2010  . Pterygium 03/10/2010   Shelton Silvas PT, DPT Shelton Silvas 08/27/2018, 3:09 PM  [facility] [address] [place], [address] Phone: [phone]   Fax:  [phone]  Name: AMEL GIANINO MRN: [id number] Date of Birth: [birth year]

## 2018-08-29 ENCOUNTER — Other Ambulatory Visit: Payer: Self-pay

## 2018-08-29 ENCOUNTER — Encounter: Payer: Self-pay | Admitting: Physical Therapy

## 2018-08-29 ENCOUNTER — Ambulatory Visit: Payer: Medicare Other | Admitting: Physical Therapy

## 2018-08-29 DIAGNOSIS — M25562 Pain in left knee: Secondary | ICD-10-CM | POA: Diagnosis not present

## 2018-08-29 DIAGNOSIS — R262 Difficulty in walking, not elsewhere classified: Secondary | ICD-10-CM

## 2018-08-29 DIAGNOSIS — M25561 Pain in right knee: Secondary | ICD-10-CM

## 2018-08-29 DIAGNOSIS — G8929 Other chronic pain: Secondary | ICD-10-CM

## 2018-08-29 NOTE — Therapy (Signed)
[facility] [address], [place], [address] Phone: [phone]   Fax:  [phone]  Physical Therapy Treatment  Patient Details  Name: Derek Shelton MRN: [id number] Date of Birth: 1940/02/04 Referring Provider (PT): Dorthula Perfect Date: 08/29/2018  PT End of Session - 08/29/18 1419    Visit Number  15    Number of Visits  29    Date for PT Re-Evaluation  10/08/18    PT Start Time  0155    PT Stop Time  0225    PT Time Calculation (min)  30 min    Activity Tolerance  Patient limited by pain;Patient limited by fatigue    Behavior During Therapy  Flat affect;WFL for tasks assessed/performed       Past Medical History:  Diagnosis Date  . Anemia   . Asthma   . CHF (congestive heart failure) ([place])   . COPD (chronic obstructive pulmonary disease) ([place])   . Coronary artery disease   . Dementia ([place])    Per son's report  . Diabetes mellitus without complication ([place])   . Dysrhythmia    Atrial Fibrillation  . GERD (gastroesophageal reflux disease)   . Hyperlipidemia   . Hypertension   . Hypothyroidism   . Shortness of breath dyspnea   . Sleep apnea   . Thyroid disease     Past Surgical History:  Procedure Laterality Date  . CARDIAC CATHETERIZATION    . COLONOSCOPY N/A 08/10/2014   Procedure: COLONOSCOPY;  Surgeon: Manya Silvas, MD;  Location: [facility] ENDOSCOPY;  Service: Endoscopy;  Laterality: N/A;  . COLONOSCOPY WITH PROPOFOL N/A 04/05/2016   Procedure: COLONOSCOPY WITH PROPOFOL;  Surgeon: Lollie Sails, MD;  Location: [facility] ENDOSCOPY;  Service: Endoscopy;  Laterality: N/A;  . ESOPHAGOGASTRODUODENOSCOPY N/A 08/08/2014   Procedure: ESOPHAGOGASTRODUODENOSCOPY (EGD);  Surgeon: Manya Silvas, MD;  Location: [facility] ENDOSCOPY;  Service: Endoscopy;  Laterality: N/A;  plan for early afternoon  . EYE SURGERY    . GIVENS CAPSULE STUDY N/A 08/12/2014   Procedure: GIVENS CAPSULE STUDY;  Surgeon: Manya Silvas, MD;  Location: [facility] ENDOSCOPY;  Service: Endoscopy;  Laterality: N/A;  . rectal fistula N/A     There were no vitals filed for this visit.  Subjective Assessment - 08/29/18 1414    Subjective  Continued minimal HEP compliance. No pain at beginning of session    Pertinent History  Interpreter used and wife with patient to give history d/t cognitive impairments (dementia). Patient presenting with bilat knee pain. Pertinent history of CVA 09/2017 with associated \ L sided weakness, has been using WC in community and RW in home since, though wife reports he does not like to use the RW (ambulatory prior). Pt reports bilat knee pain that has been going on for some time with exacerbation recently since he has not been having HH (since Dec). Wife reports no pain at rest, only knee pain with walking/standing. Son helps patient with bathing and dressing, as he cannot stand for longer than 67mins without falling or having pain. Is able to complete bed mobility ind, and chair <> chair transfers IND, and propel his own WC. Patient has stairs to enter and exit home and wife reports his 2 sons carry him in his WC up and down steps. Patient is continent, but uses urinal d/t decreased motivation, and adult diaper to prevent accidents. Reports highest pain level 7/10 over the past week, and best 0/10 with rest.  Limitations  Walking;Lifting;House hold activities;Standing    How long can you sit comfortably?  unlimited    How long can you stand comfortably?  39mins     How long can you walk comfortably?  24mins    Diagnostic tests  None       Gait Training Patient able to ambulate 65ft in 29mins 15sec before having to sit down d/t L knee pain;Subsequent 72ft in 5mins 52sec; 2.49min rest beteween trials.Kermit Balo recall of path around clinic and normalized gait pattern with encouragement needed when fatigue sets in   Ther-Ex - Step up onto 6in step 3x with LLE leading (patient stopped despite  encouragement, reports L knee is having pain); 34mins rest, then 5x with RLE leading with BUE support - STS 3x 5/6/6 without RW, pushing off armrests with cuing for full stand with 3sec pause at full stand                        PT Education - 08/29/18 1417    Education Details  Therex form, gait training    Person(s) Educated  Patient    Methods  Explanation;Demonstration;Tactile cues;Verbal cues    Comprehension  Verbalized understanding;Returned demonstration;Verbal cues required       PT Short Term Goals - 08/27/18 1354      PT SHORT TERM GOAL #1   Title  Pt will be independent with HEP in order to decrease ankle pain and increase strength in order to improve pain-free function at home and work.     Baseline  08/27/18 Not completing HEP    Time  4    Period  Weeks    Status  New      PT SHORT TERM GOAL #2   Title  Patient will be able to ascend/descend 2 steps with one handrail, modI to demonstrate PLOF and safety entering/exiting home    Baseline  08/27/18 ascend and descend 4 steps with 1 handrail, with step to pattern, CGA for safety    Time  4    Period  Weeks    Status  Revised      PT SHORT TERM GOAL #3   Title  Patient will be able to ambulate 52ft with LRD to demonstrate safety with ambulation over community distances    Baseline  08/27/18 145ft with RW    Time  4    Period  Weeks    Status  Achieved      PT SHORT TERM GOAL #4   Title  Pt will increase 10MWT to 0.62m/s in order to demonstrate clinically significant improvement in household ambulation.     Baseline  08/26/28 0.49m/s    Time  4    Period  Weeks    Status  On-going      PT SHORT TERM GOAL #5   Title   Pt will decrease 5TSTS by at least 3 seconds in order to demonstrate clinically significant improvement in LE strength    Baseline  08/27/18 13sec    Time  8    Period  Weeks    Status  Achieved        PT Long Term Goals - 08/27/18 1405      PT LONG TERM GOAL #1   Title  Pt will  increase LEFS by at least 9 points in order to demonstrate significant improvement in lower extremity function.     Baseline  08/27/18 15/80    Time  8  Period  Weeks    Status  On-going      PT LONG TERM GOAL #2   Title  Pt will decrease 5TSTS to 10sec to demonstrate normal LE strength    Baseline  08/27/18 13sec    Time  8    Period  Weeks    Status  On-going      PT LONG TERM GOAL #3   Title  Pt will decrease worst pain as reported on NPRS by at least 3 points in order to demonstrate clinically significant reduction in ankle/foot pain.     Baseline  08/27/18 8/10 pain in L knee only    Time  8    Period  Weeks    Status  On-going      PT LONG TERM GOAL #4   Title  Pt will increase 10MWT to 1.9m/s in order to demonstrate clinically significant improvement in community ambulation.     Baseline  08/27/18 0.73m/s    Time  8    Period  Weeks    Status  On-going      PT LONG TERM GOAL #5   Title  Patient will be able to ascend/descend 4 steps without handrail with reciprocol gait, IND, to demonstrate PLOF and safety entering/exiting home    Baseline  08/27/18 able to ascent/descend 4 steps with unilateral handrail, step to pattern ascent RLE/descent LLE, and CGA for safety    Time  8    Period  Weeks    Status  On-going      PT LONG TERM GOAL #6   Title  Patient will be able to ambulate 278ft with LRD to demonstrate safety with ambulation over community distances    Baseline  08/27/18 189ft with supervision    Time  8    Period  Weeks    Status  On-going            Plan - 08/29/18 1549    Clinical Impression Statement  Session shortened d/t patient arriving late and being very fatigued from restless sleep. PT continued gait training and therex progression as able, with cuing and enocuragement needed for proper technique, with good carry over. PT will continue progression as able.    Personal Factors and Comorbidities  Age;Comorbidity 1;Comorbidity 2;Comorbidity 3+;Past/Current  Experience;Time since onset of injury/illness/exacerbation;Profession    Comorbidities  Dementia, CVA, COPD, CHF, DM2, HTN, HL, Afib, asthma    Examination-Activity Limitations  Bathing;Bed Mobility;Dressing;Stairs;Bend;Lift;Locomotion Level;Squat;Carry;Transfers;[place];Interpersonal Relationship;Yard Work;Cleaning;Laundry;Community Activity    Stability/Clinical Decision Making  Evolving/Moderate complexity    Clinical Decision Making  Moderate    Rehab Potential  Fair    PT Frequency  2x / week    PT Duration  8 weeks    PT Treatment/Interventions  ADLs/Self Care Home Management;Cryotherapy;Gait training;Therapeutic exercise;Patient/family education;Manual techniques;Passive range of motion;Joint Manipulations;Spinal Manipulations;Wheelchair mobility training;Neuromuscular re-education;Balance training;Functional mobility training;Electrical Stimulation;Moist Heat;DME Instruction;Therapeutic activities    PT Next Visit Plan  therex progression    PT Home Exercise Plan  STS, seated marching, seated abd RTB    Consulted and Agree with Plan of Care  Patient       Patient will benefit from skilled therapeutic intervention in order to improve the following deficits and impairments:  Abnormal gait, Decreased balance, Decreased endurance, Decreased mobility, Difficulty walking, Cardiopulmonary status limiting activity, Decreased activity tolerance, Decreased coordination, Decreased strength, Decreased safety awareness, Decreased range of motion, Improper body mechanics, Obesity, Impaired flexibility, Increased fascial restricitons, Postural dysfunction,  Pain  Visit Diagnosis: 1. Chronic pain of left knee   2. Chronic pain of right knee   3. Difficulty in walking, not elsewhere classified        Problem List Patient Active Problem List   Diagnosis Date Noted  . BPH (benign prostatic hyperplasia) 07/18/2018  . Degeneration of lumbar  intervertebral disc 07/17/2018  . Osteoarthritis of knee 07/17/2018  . Spinal stenosis of lumbar region 07/17/2018  . Acute CVA (cerebrovascular accident) ([place]) 12/02/2017  . Acute ischemic stroke ([place]) 12/02/2017  . Acute metabolic encephalopathy due to hypoglycemia 11/29/2017  . Essential hypertension 11/29/2017  . Syncope and collapse 11/28/2017  . A-fib ([place]) 05/22/2017  . Chest pain 02/21/2017  . AKI (acute kidney injury) ([place]) 02/21/2017  . SOB (shortness of breath) 02/08/2017  . Atrial fibrillation with RVR ([place]) 02/08/2017  . GI bleeding 08/06/2014  . Anemia 08/06/2014  . Bradycardia 08/06/2014  . Atrial fibrillation ([place]) 08/06/2014  . Hyponatremia 08/06/2014  . Chronic diastolic heart failure ([place]) 08/06/2014  . OSA (obstructive sleep apnea) 08/06/2014  . Memory loss or impairment 05/29/2014  . Type 2 diabetes mellitus, without long-term current use of insulin ([place]) 08/11/2010  . 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 Chronic rhinitis 08/11/2010  . Coronary artery disease 08/11/2010  . Esophageal reflux 08/11/2010  . Personal history of arthritis 08/11/2010  . Tear film insufficiency 06/07/2010  . 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 Keratoconjunctivitis sicca ([place]) 03/10/2010  . Lens replaced 03/10/2010  . Pterygium 03/10/2010   Shelton Silvas PT, DPT Shelton Silvas 08/29/2018, 3:51 PM  [facility] [address] [place], [address] Phone: [phone]   Fax:  [phone]  Name: LARZ MARK MRN: [id number] Date of Birth: 10/04/1940

## 2018-09-03 ENCOUNTER — Ambulatory Visit: Payer: Medicare Other | Admitting: Physical Therapy

## 2018-09-03 ENCOUNTER — Other Ambulatory Visit: Payer: Self-pay

## 2018-09-03 DIAGNOSIS — R262 Difficulty in walking, not elsewhere classified: Secondary | ICD-10-CM

## 2018-09-03 DIAGNOSIS — M25562 Pain in left knee: Secondary | ICD-10-CM | POA: Diagnosis not present

## 2018-09-03 DIAGNOSIS — G8929 Other chronic pain: Secondary | ICD-10-CM

## 2018-09-03 NOTE — Therapy (Signed)
[facility] [address], [place], [address] Phone: [phone]   Fax:  [phone]  Physical Therapy Treatment  Patient Details  Name: Derek Shelton MRN: [id number] Date of Birth: 10-21-1940 Referring Provider (PT): Dorthula Perfect Date: 09/03/2018  PT End of Session - 09/03/18 1333    Visit Number  16    Number of Visits  29    Date for PT Re-Evaluation  10/08/18    PT Start Time  0114    PT Stop Time  0145    PT Time Calculation (min)  31 min    Activity Tolerance  Patient limited by fatigue;Patient limited by pain    Behavior During Therapy  Flat affect;WFL for tasks assessed/performed       Past Medical History:  Diagnosis Date  . Anemia   . Asthma   . CHF (congestive heart failure) ([place])   . COPD (chronic obstructive pulmonary disease) ([place])   . Coronary artery disease   . Dementia ([place])    Per son's report  . Diabetes mellitus without complication ([place])   . Dysrhythmia    Atrial Fibrillation  . GERD (gastroesophageal reflux disease)   . Hyperlipidemia   . Hypertension   . Hypothyroidism   . Shortness of breath dyspnea   . Sleep apnea   . Thyroid disease     Past Surgical History:  Procedure Laterality Date  . CARDIAC CATHETERIZATION    . COLONOSCOPY N/A 08/10/2014   Procedure: COLONOSCOPY;  Surgeon: Manya Silvas, MD;  Location: [facility] ENDOSCOPY;  Service: Endoscopy;  Laterality: N/A;  . COLONOSCOPY WITH PROPOFOL N/A 04/05/2016   Procedure: COLONOSCOPY WITH PROPOFOL;  Surgeon: Lollie Sails, MD;  Location: [facility] ENDOSCOPY;  Service: Endoscopy;  Laterality: N/A;  . ESOPHAGOGASTRODUODENOSCOPY N/A 08/08/2014   Procedure: ESOPHAGOGASTRODUODENOSCOPY (EGD);  Surgeon: Manya Silvas, MD;  Location: [facility] ENDOSCOPY;  Service: Endoscopy;  Laterality: N/A;  plan for early afternoon  . EYE SURGERY    . GIVENS CAPSULE STUDY N/A 08/12/2014   Procedure: GIVENS CAPSULE STUDY;  Surgeon: Manya Silvas, MD;  Location: [facility] ENDOSCOPY;  Service: Endoscopy;  Laterality: N/A;  . rectal fistula N/A     There were no vitals filed for this visit.  Subjective Assessment - 09/03/18 1329    Subjective  Reports some L knee pain, not too bad.    Pertinent History  Interpreter used and wife with patient to give history d/t cognitive impairments (dementia). Patient presenting with bilat knee pain. Pertinent history of CVA 09/2017 with associated \ L sided weakness, has been using WC in community and RW in home since, though wife reports he does not like to use the RW (ambulatory prior). Pt reports bilat knee pain that has been going on for some time with exacerbation recently since he has not been having HH (since Dec). Wife reports no pain at rest, only knee pain with walking/standing. Son helps patient with bathing and dressing, as he cannot stand for longer than 67mins without falling or having pain. Is able to complete bed mobility ind, and chair <> chair transfers IND, and propel his own WC. Patient has stairs to enter and exit home and wife reports his 2 sons carry him in his WC up and down steps. Patient is continent, but uses urinal d/t decreased motivation, and adult diaper to prevent accidents. Reports highest pain level 7/10 over the past week, and best 0/10 with rest.  Limitations  Walking;Lifting;House hold activities;Standing    How long can you sit comfortably?  unlimited    How long can you stand comfortably?  45mins     How long can you walk comfortably?  14mins    Diagnostic tests  None           Gait Training Patient able to ambulate39ft5mins before having to sit down d/t L knee pain;.Good recall of path around clinic and normalized gait pattern with encouragement needed when fatigue sets in   Ther-Ex -alt LE taps onto 4in step with RW 3x 12 with good carry over of proper technique following demo - Heel raises with RW 3x 12 with good form following demo,  encouragement needed for full ROM - STS 3x 5/6/6 without RW, pushing off armrests with cuing for full stand with 3sec pause at full stand  Seated rest needed between ambulation and therex sets                         PT Education - 09/03/18 1333    Education Details  therex form; gait training    Person(s) Educated  Patient    Methods  Explanation;Demonstration;Verbal cues    Comprehension  Verbalized understanding;Returned demonstration;Verbal cues required       PT Short Term Goals - 08/27/18 1354      PT SHORT TERM GOAL #1   Title  Pt will be independent with HEP in order to decrease ankle pain and increase strength in order to improve pain-free function at home and work.     Baseline  08/27/18 Not completing HEP    Time  4    Period  Weeks    Status  New      PT SHORT TERM GOAL #2   Title  Patient will be able to ascend/descend 2 steps with one handrail, modI to demonstrate PLOF and safety entering/exiting home    Baseline  08/27/18 ascend and descend 4 steps with 1 handrail, with step to pattern, CGA for safety    Time  4    Period  Weeks    Status  Revised      PT SHORT TERM GOAL #3   Title  Patient will be able to ambulate 76ft with LRD to demonstrate safety with ambulation over community distances    Baseline  08/27/18 131ft with RW    Time  4    Period  Weeks    Status  Achieved      PT SHORT TERM GOAL #4   Title  Pt will increase 10MWT to 0.26m/s in order to demonstrate clinically significant improvement in household ambulation.     Baseline  08/26/28 0.17m/s    Time  4    Period  Weeks    Status  On-going      PT SHORT TERM GOAL #5   Title   Pt will decrease 5TSTS by at least 3 seconds in order to demonstrate clinically significant improvement in LE strength    Baseline  08/27/18 13sec    Time  8    Period  Weeks    Status  Achieved        PT Long Term Goals - 08/27/18 1405      PT LONG TERM GOAL #1   Title  Pt will increase LEFS by at  least 9 points in order to demonstrate significant improvement in lower extremity function.     Baseline  08/27/18 15/80  Time  8    Period  Weeks    Status  On-going      PT LONG TERM GOAL #2   Title  Pt will decrease 5TSTS to 10sec to demonstrate normal LE strength    Baseline  08/27/18 13sec    Time  8    Period  Weeks    Status  On-going      PT LONG TERM GOAL #3   Title  Pt will decrease worst pain as reported on NPRS by at least 3 points in order to demonstrate clinically significant reduction in ankle/foot pain.     Baseline  08/27/18 8/10 pain in L knee only    Time  8    Period  Weeks    Status  On-going      PT LONG TERM GOAL #4   Title  Pt will increase 10MWT to 1.76m/s in order to demonstrate clinically significant improvement in community ambulation.     Baseline  08/27/18 0.11m/s    Time  8    Period  Weeks    Status  On-going      PT LONG TERM GOAL #5   Title  Patient will be able to ascend/descend 4 steps without handrail with reciprocol gait, IND, to demonstrate PLOF and safety entering/exiting home    Baseline  08/27/18 able to ascent/descend 4 steps with unilateral handrail, step to pattern ascent RLE/descent LLE, and CGA for safety    Time  8    Period  Weeks    Status  On-going      PT LONG TERM GOAL #6   Title  Patient will be able to ambulate 218ft with LRD to demonstrate safety with ambulation over community distances    Baseline  08/27/18 135ft with supervision    Time  8    Period  Weeks    Status  On-going            Plan - 09/03/18 1334    Clinical Impression Statement  Session shortened d/t patient arriving late. Progressed therex for endurance focus with good carry over proper form following demo and cuing. PT continued walking progression, with patient able to ambulate more feet consistency than last session. PT will continue progression as able.    Personal Factors and Comorbidities  Age;Comorbidity 1;Comorbidity 2;Comorbidity 3+;Past/Current  Experience;Time since onset of injury/illness/exacerbation;Profession    Comorbidities  Dementia, CVA, COPD, CHF, DM2, HTN, HL, Afib, asthma    Examination-Activity Limitations  Bathing;Bed Mobility;Dressing;Stairs;Bend;Lift;Locomotion Level;Squat;Carry;Transfers;[place];Interpersonal Relationship;Yard Work;Cleaning;Laundry;Community Activity    Stability/Clinical Decision Making  Evolving/Moderate complexity    Clinical Decision Making  Moderate    Rehab Potential  Fair    PT Frequency  2x / week    PT Duration  8 weeks    PT Treatment/Interventions  ADLs/Self Care Home Management;Cryotherapy;Gait training;Therapeutic exercise;Patient/family education;Manual techniques;Passive range of motion;Joint Manipulations;Spinal Manipulations;Wheelchair mobility training;Neuromuscular re-education;Balance training;Functional mobility training;Electrical Stimulation;Moist Heat;DME Instruction;Therapeutic activities    PT Next Visit Plan  therex progression    PT Home Exercise Plan  STS, seated marching, seated abd RTB    Consulted and Agree with Plan of Care  Patient       Patient will benefit from skilled therapeutic intervention in order to improve the following deficits and impairments:  Abnormal gait, Decreased balance, Decreased endurance, Decreased mobility, Difficulty walking, Cardiopulmonary status limiting activity, Decreased activity tolerance, Decreased coordination, Decreased strength, Decreased safety awareness, Decreased range of motion, Improper body mechanics,  Obesity, Impaired flexibility, Increased fascial restricitons, Postural dysfunction, Pain  Visit Diagnosis: 1. Chronic pain of left knee   2. Chronic pain of right knee   3. Difficulty in walking, not elsewhere classified        Problem List Patient Active Problem List   Diagnosis Date Noted  . BPH (benign prostatic hyperplasia) 07/18/2018  . Degeneration of lumbar  intervertebral disc 07/17/2018  . Osteoarthritis of knee 07/17/2018  . Spinal stenosis of lumbar region 07/17/2018  . Acute CVA (cerebrovascular accident) ([place]) 12/02/2017  . Acute ischemic stroke ([place]) 12/02/2017  . Acute metabolic encephalopathy due to hypoglycemia 11/29/2017  . Essential hypertension 11/29/2017  . Syncope and collapse 11/28/2017  . A-fib ([place]) 05/22/2017  . Chest pain 02/21/2017  . AKI (acute kidney injury) ([place]) 02/21/2017  . SOB (shortness of breath) 02/08/2017  . Atrial fibrillation with RVR ([place]) 02/08/2017  . GI bleeding 08/06/2014  . Anemia 08/06/2014  . Bradycardia 08/06/2014  . Atrial fibrillation ([place]) 08/06/2014  . Hyponatremia 08/06/2014  . Chronic diastolic heart failure ([place]) 08/06/2014  . OSA (obstructive sleep apnea) 08/06/2014  . Memory loss or impairment 05/29/2014  . Type 2 diabetes mellitus, without long-term current use of insulin ([place]) 08/11/2010  . 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 Chronic rhinitis 08/11/2010  . Coronary artery disease 08/11/2010  . Esophageal reflux 08/11/2010  . Personal history of arthritis 08/11/2010  . Tear film insufficiency 06/07/2010  . 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 Keratoconjunctivitis sicca ([place]) 03/10/2010  . Lens replaced 03/10/2010  . Pterygium 03/10/2010   Shelton Silvas PT, DPT Shelton Silvas 09/03/2018, 1:41 PM  [facility] [address] [place], [address] Phone: [phone]   Fax:  [phone]  Name: DYLANN GALLIER MRN: [id number] Date of Birth: 02-28-1940

## 2018-09-05 ENCOUNTER — Ambulatory Visit: Payer: Medicare Other | Admitting: Physical Therapy

## 2018-09-10 ENCOUNTER — Other Ambulatory Visit: Payer: Self-pay

## 2018-09-10 ENCOUNTER — Ambulatory Visit: Payer: Medicare Other | Admitting: Physical Therapy

## 2018-09-10 ENCOUNTER — Encounter: Payer: Self-pay | Admitting: Physical Therapy

## 2018-09-10 DIAGNOSIS — G8929 Other chronic pain: Secondary | ICD-10-CM

## 2018-09-10 DIAGNOSIS — M25562 Pain in left knee: Secondary | ICD-10-CM | POA: Diagnosis not present

## 2018-09-10 DIAGNOSIS — R262 Difficulty in walking, not elsewhere classified: Secondary | ICD-10-CM

## 2018-09-10 NOTE — Therapy (Signed)
[facility] [address], [place], [address] Phone: [phone]   Fax:  [phone]  Physical Therapy Treatment  Patient Details  Name: Derek Derek MRN: [id number] Date of Birth: 09-Nov-1940 Referring Provider (PT): Dorthula Perfect Date: 09/10/2018  PT End of Session - 09/10/18 1322    Visit Number  17    Number of Visits  29    Date for PT Re-Evaluation  10/08/18    PT Start Time  0100    PT Stop Time  0140    PT Time Calculation (min)  40 min    Activity Tolerance  Patient limited by fatigue;Patient limited by pain    Behavior During Therapy  Flat affect;WFL for tasks assessed/performed       Past Medical History:  Diagnosis Date  . Anemia   . Asthma   . CHF (congestive heart failure) ([place])   . COPD (chronic obstructive pulmonary disease) ([place])   . Coronary artery disease   . Dementia ([place])    Per son's report  . Diabetes mellitus without complication ([place])   . Dysrhythmia    Atrial Fibrillation  . GERD (gastroesophageal reflux disease)   . Hyperlipidemia   . Hypertension   . Hypothyroidism   . Shortness of breath dyspnea   . Sleep apnea   . Thyroid disease     Past Surgical History:  Procedure Laterality Date  . CARDIAC CATHETERIZATION    . COLONOSCOPY N/A 08/10/2014   Procedure: COLONOSCOPY;  Surgeon: Manya Silvas, MD;  Location: [facility] ENDOSCOPY;  Service: Endoscopy;  Laterality: N/A;  . COLONOSCOPY WITH PROPOFOL N/A 04/05/2016   Procedure: COLONOSCOPY WITH PROPOFOL;  Surgeon: Lollie Sails, MD;  Location: [facility] ENDOSCOPY;  Service: Endoscopy;  Laterality: N/A;  . ESOPHAGOGASTRODUODENOSCOPY N/A 08/08/2014   Procedure: ESOPHAGOGASTRODUODENOSCOPY (EGD);  Surgeon: Manya Silvas, MD;  Location: [facility] ENDOSCOPY;  Service: Endoscopy;  Laterality: N/A;  plan for early afternoon  . EYE SURGERY    . GIVENS CAPSULE STUDY N/A 08/12/2014   Procedure: GIVENS CAPSULE STUDY;  Surgeon: Manya Silvas, MD;  Location: [facility] ENDOSCOPY;  Service: Endoscopy;  Laterality: N/A;  . rectal fistula N/A     There were no vitals filed for this visit.  Subjective Assessment - 09/10/18 1320    Subjective  L knee pain, not too bad. Min compliance with HEP    Pertinent History  Interpreter used and wife with patient to give history d/t cognitive impairments (dementia). Patient presenting with bilat knee pain. Pertinent history of CVA 09/2017 with associated \ L sided weakness, has been using WC in community and RW in home since, though wife reports he does not like to use the RW (ambulatory prior). Pt reports bilat knee pain that has been going on for some time with exacerbation recently since he has not been having HH (since Dec). Wife reports no pain at rest, only knee pain with walking/standing. Son helps patient with bathing and dressing, as he cannot stand for longer than 52mins without falling or having pain. Is able to complete bed mobility ind, and chair <> chair transfers IND, and propel his own WC. Patient has stairs to enter and exit home and wife reports his 2 sons carry him in his WC up and down steps. Patient is continent, but uses urinal d/t decreased motivation, and adult diaper to prevent accidents. Reports highest pain level 7/10 over the past week, and best 0/10 with rest.  Limitations  Walking;Lifting;House hold activities;Standing    How long can you sit comfortably?  unlimited    Diagnostic tests  None        Gait Training Patient able to ambulate169ft4mins 17sec; 4min rest break; 151ft 49mins 29secs before having to sit down d/t L knee pain; 41min break needed before therex.Good recall of path around clinic and normalized gait pattern with encouragement needed when fatigue sets in for R foot clearance.  Ambulation around clinic totaling 34ft throughout therex with cuing for R foot clearance Continued cuing throughout session for at least 1 UE push off from [facility] for safety with  standing prior to ambulation  Ther-Ex -Step over and back over a 6in hurdle with BUE support on RW 3x 5/3/3 CGA for safety - Step up onto 6in step with BUE support RLE leading up and back down 3x 5/5 - Heel raises with RW 3x 12 with good form following demo, encouragement needed for full ROM   Seated rest needed between ambulation and therex sets                         PT Education - 09/10/18 1321    Education Details  therex form; gait training    Person(s) Educated  Patient    Methods  Explanation;Demonstration;Tactile cues;Verbal cues    Comprehension  Verbalized understanding;Returned demonstration;Verbal cues required;Tactile cues required       PT Short Term Goals - 08/27/18 1354      PT SHORT TERM GOAL #1   Title  Pt will be independent with HEP in order to decrease ankle pain and increase strength in order to improve pain-free function at home and work.     Baseline  08/27/18 Not completing HEP    Time  4    Period  Weeks    Status  New      PT SHORT TERM GOAL #2   Title  Patient will be able to ascend/descend 2 steps with one handrail, modI to demonstrate PLOF and safety entering/exiting home    Baseline  08/27/18 ascend and descend 4 steps with 1 handrail, with step to pattern, CGA for safety    Time  4    Period  Weeks    Status  Revised      PT SHORT TERM GOAL #3   Title  Patient will be able to ambulate 27ft with LRD to demonstrate safety with ambulation over community distances    Baseline  08/27/18 19ft with RW    Time  4    Period  Weeks    Status  Achieved      PT SHORT TERM GOAL #4   Title  Pt will increase 10MWT to 0.74m/s in order to demonstrate clinically significant improvement in household ambulation.     Baseline  08/26/28 0.30m/s    Time  4    Period  Weeks    Status  On-going      PT SHORT TERM GOAL #5   Title   Pt will decrease 5TSTS by at least 3 seconds in order to demonstrate clinically significant improvement in LE  strength    Baseline  08/27/18 13sec    Time  8    Period  Weeks    Status  Achieved        PT Long Term Goals - 08/27/18 1405      PT LONG TERM GOAL #1   Title  Pt will increase LEFS  by at least 9 points in order to demonstrate significant improvement in lower extremity function.     Baseline  08/27/18 15/80    Time  8    Period  Weeks    Status  On-going      PT LONG TERM GOAL #2   Title  Pt will decrease 5TSTS to 10sec to demonstrate normal LE strength    Baseline  08/27/18 13sec    Time  8    Period  Weeks    Status  On-going      PT LONG TERM GOAL #3   Title  Pt will decrease worst pain as reported on NPRS by at least 3 points in order to demonstrate clinically significant reduction in ankle/foot pain.     Baseline  08/27/18 8/10 pain in L knee only    Time  8    Period  Weeks    Status  On-going      PT LONG TERM GOAL #4   Title  Pt will increase 10MWT to 1.87m/s in order to demonstrate clinically significant improvement in community ambulation.     Baseline  08/27/18 0.88m/s    Time  8    Period  Weeks    Status  On-going      PT LONG TERM GOAL #5   Title  Patient will be able to ascend/descend 4 steps without handrail with reciprocol gait, IND, to demonstrate PLOF and safety entering/exiting home    Baseline  08/27/18 able to ascent/descend 4 steps with unilateral handrail, step to pattern ascent RLE/descent LLE, and CGA for safety    Time  8    Period  Weeks    Status  On-going      PT LONG TERM GOAL #6   Title  Patient will be able to ambulate 225ft with LRD to demonstrate safety with ambulation over community distances    Baseline  08/27/18 144ft with supervision    Time  8    Period  Weeks    Status  On-going            Plan - 09/10/18 1519    Clinical Impression Statement  PT continued gait endurance and therex progression with patient able to complete all with encouragement needed and cuing for cuing for proper techniques. PT will continue progression as  able.    Personal Factors and Comorbidities  Age;Comorbidity 1;Comorbidity 2;Comorbidity 3+;Past/Current Experience;Time since onset of injury/illness/exacerbation;Profession    Comorbidities  Dementia, CVA, COPD, CHF, DM2, HTN, HL, Afib, asthma    Examination-Activity Limitations  Bathing;Bed Mobility;Dressing;Stairs;Bend;Lift;Locomotion Level;Squat;Carry;Transfers;Toileting    Stability/Clinical Decision Making  Evolving/Moderate complexity    Clinical Decision Making  Moderate    Rehab Potential  Fair    PT Frequency  2x / week    PT Duration  8 weeks    PT Treatment/Interventions  ADLs/Self Care Home Management;Cryotherapy;Gait training;Therapeutic exercise;Patient/family education;Manual techniques;Passive range of motion;Joint Manipulations;Spinal Manipulations;Wheelchair mobility training;Neuromuscular re-education;Balance training;Functional mobility training;Electrical Stimulation;Moist Heat;DME Instruction;Therapeutic activities    PT Next Visit Plan  therex progression    PT Home Exercise Plan  STS, seated marching, seated abd RTB    Consulted and Agree with Plan of Care  Patient       Patient will benefit from skilled therapeutic intervention in order to improve the following deficits and impairments:  Abnormal gait, Decreased balance, Decreased endurance, Decreased mobility, Difficulty walking, Cardiopulmonary status limiting activity, Decreased activity tolerance, Decreased coordination, Decreased strength, Decreased safety awareness, Decreased range of motion,  Improper body mechanics, Obesity, Impaired flexibility, Increased fascial restricitons, Postural dysfunction, Pain  Visit Diagnosis: 1. Chronic pain of left knee   2. Chronic pain of right knee   3. Difficulty in walking, not elsewhere classified        Problem List Patient Active Problem List   Diagnosis Date Noted  . BPH (benign prostatic hyperplasia) 07/18/2018  . Degeneration of lumbar intervertebral disc  07/17/2018  . Osteoarthritis of knee 07/17/2018  . Spinal stenosis of lumbar region 07/17/2018  . Acute CVA (cerebrovascular accident) ([place]) 12/02/2017  . Acute ischemic stroke ([place]) 12/02/2017  . Acute metabolic encephalopathy due to hypoglycemia 11/29/2017  . Essential hypertension 11/29/2017  . Syncope and collapse 11/28/2017  . A-fib ([place]) 05/22/2017  . Chest pain 02/21/2017  . AKI (acute kidney injury) ([place]) 02/21/2017  . SOB (shortness of breath) 02/08/2017  . Atrial fibrillation with RVR ([place]) 02/08/2017  . GI bleeding 08/06/2014  . Anemia 08/06/2014  . Bradycardia 08/06/2014  . Atrial fibrillation ([place]) 08/06/2014  . Hyponatremia 08/06/2014  . Chronic diastolic heart failure ([place]) 08/06/2014  . OSA (obstructive sleep apnea) 08/06/2014  . Memory loss or impairment 05/29/2014  . Type 2 diabetes mellitus, without long-term current use of insulin ([place]) 08/11/2010  . 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 Chronic rhinitis 08/11/2010  . Coronary artery disease 08/11/2010  . Esophageal reflux 08/11/2010  . Personal history of arthritis 08/11/2010  . Tear film insufficiency 06/07/2010  . 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 Keratoconjunctivitis sicca ([place]) 03/10/2010  . Lens replaced 03/10/2010  . Pterygium 03/10/2010   Derek Derek PT, DPT Derek Derek 09/10/2018, 3:26 PM  [facility] [address], [place], [address] Phone: [phone]   Fax:  [phone]  Name: Derek Derek MRN: [id number] Date of Birth: 11/10/1940

## 2018-09-12 ENCOUNTER — Other Ambulatory Visit: Payer: Self-pay

## 2018-09-12 ENCOUNTER — Ambulatory Visit: Payer: Medicare Other | Admitting: Physical Therapy

## 2018-09-12 ENCOUNTER — Encounter: Payer: Self-pay | Admitting: Physical Therapy

## 2018-09-12 DIAGNOSIS — G8929 Other chronic pain: Secondary | ICD-10-CM

## 2018-09-12 DIAGNOSIS — R262 Difficulty in walking, not elsewhere classified: Secondary | ICD-10-CM

## 2018-09-12 DIAGNOSIS — M25562 Pain in left knee: Secondary | ICD-10-CM | POA: Diagnosis not present

## 2018-09-12 NOTE — Therapy (Signed)
[facility] [address], [place], [address] Phone: [phone]   Fax:  [phone]  Physical Therapy Treatment  Patient Details  Name: Derek Shelton MRN: [id number] Date of Birth: [birth year] Referring Provider (PT): Dorthula Perfect Date: 09/12/2018  PT End of Session - 09/12/18 1331    Visit Number  18    Number of Visits  29    Date for PT Re-Evaluation  10/08/18    PT Start Time  0108    PT Stop Time  0130    PT Time Calculation (min)  22 min    Activity Tolerance  Patient limited by fatigue;Patient limited by pain;Treatment limited secondary to agitation    Behavior During Therapy  Agitated       Past Medical History:  Diagnosis Date  . Anemia   . Asthma   . CHF (congestive heart failure) ([place])   . COPD (chronic obstructive pulmonary disease) ([place])   . Coronary artery disease   . Dementia ([place])    Per son's report  . Diabetes mellitus without complication ([place])   . Dysrhythmia    Atrial Fibrillation  . GERD (gastroesophageal reflux disease)   . Hyperlipidemia   . Hypertension   . Hypothyroidism   . Shortness of breath dyspnea   . Sleep apnea   . Thyroid disease     Past Surgical History:  Procedure Laterality Date  . CARDIAC CATHETERIZATION    . COLONOSCOPY N/A 08/10/2014   Procedure: COLONOSCOPY;  Surgeon: Manya Silvas, MD;  Location: [facility] ENDOSCOPY;  Service: Endoscopy;  Laterality: N/A;  . COLONOSCOPY WITH PROPOFOL N/A 04/05/2016   Procedure: COLONOSCOPY WITH PROPOFOL;  Surgeon: Lollie Sails, MD;  Location: [facility] ENDOSCOPY;  Service: Endoscopy;  Laterality: N/A;  . ESOPHAGOGASTRODUODENOSCOPY N/A 08/08/2014   Procedure: ESOPHAGOGASTRODUODENOSCOPY (EGD);  Surgeon: Manya Silvas, MD;  Location: [facility] ENDOSCOPY;  Service: Endoscopy;  Laterality: N/A;  plan for early afternoon  . EYE SURGERY    . GIVENS CAPSULE STUDY N/A 08/12/2014   Procedure: GIVENS CAPSULE STUDY;  Surgeon:  Manya Silvas, MD;  Location: [facility] ENDOSCOPY;  Service: Endoscopy;  Laterality: N/A;  . rectal fistula N/A     There were no vitals filed for this visit.  Subjective Assessment - 09/12/18 1324    Subjective  Minimal pain, walked into clinic with RW for first time since beginning therapy.    Pertinent History  Interpreter used and wife with patient to give history d/t cognitive impairments (dementia). Patient presenting with bilat knee pain. Pertinent history of CVA 09/2017 with associated \ L sided weakness, has been using WC in community and RW in home since, though wife reports he does not like to use the RW (ambulatory prior). Pt reports bilat knee pain that has been going on for some time with exacerbation recently since he has not been having HH (since Dec). Wife reports no pain at rest, only knee pain with walking/standing. Son helps patient with bathing and dressing, as he cannot stand for longer than 22mins without falling or having pain. Is able to complete bed mobility ind, and chair <> chair transfers IND, and propel his own WC. Patient has stairs to enter and exit home and wife reports his 2 sons carry him in his WC up and down steps. Patient is continent, but uses urinal d/t decreased motivation, and adult diaper to prevent accidents. Reports highest pain level 7/10 over the past week, and best  0/10 with rest.     Limitations  Walking;Lifting;House hold activities;Standing    How long can you sit comfortably?  unlimited    How long can you stand comfortably?  47mins     How long can you walk comfortably?  42mins    Diagnostic tests  None         Gait Training Patient able to ambulate198ft5mins 37sec; 51min rest break; 158ft 38mins 42secsbefore having to sit down d/t L knee pain; long break following before walking back into waiting room. Some standing breaks, PT encouragement needed to continue at times, and cuing for R foot clearance with UE support, esp following fatigue. Patient  able to walk from waiting room around clinic, for first time since starting therapy Ambulation around clinic totaling 97ft throughout therex with cuing for R foot clearance Continued cuing throughout session for at least 1 UE push off from [facility] for safety with standing prior to ambulation                          PT Education - 09/12/18 1331    Education Details  therex form    Person(s) Educated  Patient    Methods  Explanation;Demonstration;Tactile cues;Verbal cues    Comprehension  Verbalized understanding;Returned demonstration;Verbal cues required;Tactile cues required       PT Short Term Goals - 08/27/18 1354      PT SHORT TERM GOAL #1   Title  Pt will be independent with HEP in order to decrease ankle pain and increase strength in order to improve pain-free function at home and work.     Baseline  08/27/18 Not completing HEP    Time  4    Period  Weeks    Status  New      PT SHORT TERM GOAL #2   Title  Patient will be able to ascend/descend 2 steps with one handrail, modI to demonstrate PLOF and safety entering/exiting home    Baseline  08/27/18 ascend and descend 4 steps with 1 handrail, with step to pattern, CGA for safety    Time  4    Period  Weeks    Status  Revised      PT SHORT TERM GOAL #3   Title  Patient will be able to ambulate 20ft with LRD to demonstrate safety with ambulation over community distances    Baseline  08/27/18 157ft with RW    Time  4    Period  Weeks    Status  Achieved      PT SHORT TERM GOAL #4   Title  Pt will increase 10MWT to 0.29m/s in order to demonstrate clinically significant improvement in household ambulation.     Baseline  08/26/28 0.48m/s    Time  4    Period  Weeks    Status  On-going      PT SHORT TERM GOAL #5   Title   Pt will decrease 5TSTS by at least 3 seconds in order to demonstrate clinically significant improvement in LE strength    Baseline  08/27/18 13sec    Time  8    Period  Weeks    Status   Achieved        PT Long Term Goals - 08/27/18 1405      PT LONG TERM GOAL #1   Title  Pt will increase LEFS by at least 9 points in order to demonstrate significant improvement in lower extremity function.  Baseline  08/27/18 15/80    Time  8    Period  Weeks    Status  On-going      PT LONG TERM GOAL #2   Title  Pt will decrease 5TSTS to 10sec to demonstrate normal LE strength    Baseline  08/27/18 13sec    Time  8    Period  Weeks    Status  On-going      PT LONG TERM GOAL #3   Title  Pt will decrease worst pain as reported on NPRS by at least 3 points in order to demonstrate clinically significant reduction in ankle/foot pain.     Baseline  08/27/18 8/10 pain in L knee only    Time  8    Period  Weeks    Status  On-going      PT LONG TERM GOAL #4   Title  Pt will increase 10MWT to 1.48m/s in order to demonstrate clinically significant improvement in community ambulation.     Baseline  08/27/18 0.86m/s    Time  8    Period  Weeks    Status  On-going      PT LONG TERM GOAL #5   Title  Patient will be able to ascend/descend 4 steps without handrail with reciprocol gait, IND, to demonstrate PLOF and safety entering/exiting home    Baseline  08/27/18 able to ascent/descend 4 steps with unilateral handrail, step to pattern ascent RLE/descent LLE, and CGA for safety    Time  8    Period  Weeks    Status  On-going      PT LONG TERM GOAL #6   Title  Patient will be able to ambulate 260ft with LRD to demonstrate safety with ambulation over community distances    Baseline  08/27/18 147ft with supervision    Time  8    Period  Weeks    Status  On-going            Plan - 09/12/18 1332    Clinical Impression Statement  PT continued gait training with good success over first trial. During second trial pt could not continue d/t L knee pain. After a seated break, PT attempted therex, which patient reports he does not want to do, that he does not feel good. PT attempted to talk to  patient about completing low impact, seated exercises, but patient is not in agreeance to continue session, notes that he is also breathing hard in his mask. PT ended session, reinerated HEP compliance importance and reminded him of his next appt. Will progress as able.    Personal Factors and Comorbidities  Age;Comorbidity 1;Comorbidity 2;Comorbidity 3+;Past/Current Experience;Time since onset of injury/illness/exacerbation;Profession    Comorbidities  Dementia, CVA, COPD, CHF, DM2, HTN, HL, Afib, asthma    Examination-Activity Limitations  Bathing;Bed Mobility;Dressing;Stairs;Bend;Lift;Locomotion Level;Squat;Carry;Transfers;[place];Interpersonal Relationship;Yard Work;Cleaning;Laundry;Community Activity    Stability/Clinical Decision Making  Evolving/Moderate complexity    Clinical Decision Making  Moderate    Rehab Potential  Fair    PT Frequency  2x / week    PT Duration  8 weeks    PT Treatment/Interventions  ADLs/Self Care Home Management;Cryotherapy;Gait training;Therapeutic exercise;Patient/family education;Manual techniques;Passive range of motion;Joint Manipulations;Spinal Manipulations;Wheelchair mobility training;Neuromuscular re-education;Balance training;Functional mobility training;Electrical Stimulation;Moist Heat;DME Instruction;Therapeutic activities    PT Next Visit Plan  therex progression    PT Home Exercise Plan  STS, seated marching, seated abd RTB    Consulted and Agree with Plan of  Care  Patient       Patient will benefit from skilled therapeutic intervention in order to improve the following deficits and impairments:  Abnormal gait, Decreased balance, Decreased endurance, Decreased mobility, Difficulty walking, Cardiopulmonary status limiting activity, Decreased activity tolerance, Decreased coordination, Decreased strength, Decreased safety awareness, Decreased range of motion, Improper body mechanics, Obesity, Impaired  flexibility, Increased fascial restricitons, Postural dysfunction, Pain  Visit Diagnosis: 1. Chronic pain of left knee   2. Chronic pain of right knee   3. Difficulty in walking, not elsewhere classified        Problem List Patient Active Problem List   Diagnosis Date Noted  . BPH (benign prostatic hyperplasia) 07/18/2018  . Degeneration of lumbar intervertebral disc 07/17/2018  . Osteoarthritis of knee 07/17/2018  . Spinal stenosis of lumbar region 07/17/2018  . Acute CVA (cerebrovascular accident) ([place]) 12/02/2017  . Acute ischemic stroke ([place]) 12/02/2017  . Acute metabolic encephalopathy due to hypoglycemia 11/29/2017  . Essential hypertension 11/29/2017  . Syncope and collapse 11/28/2017  . A-fib ([place]) 05/22/2017  . Chest pain 02/21/2017  . AKI (acute kidney injury) ([place]) 02/21/2017  . SOB (shortness of breath) 02/08/2017  . Atrial fibrillation with RVR ([place]) 02/08/2017  . GI bleeding 08/06/2014  . Anemia 08/06/2014  . Bradycardia 08/06/2014  . Atrial fibrillation ([place]) 08/06/2014  . Hyponatremia 08/06/2014  . Chronic diastolic heart failure ([place]) 08/06/2014  . OSA (obstructive sleep apnea) 08/06/2014  . Memory loss or impairment 05/29/2014  . Type 2 diabetes mellitus, without long-term current use of insulin ([place]) 08/11/2010  . 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 Chronic rhinitis 08/11/2010  . Coronary artery disease 08/11/2010  . Esophageal reflux 08/11/2010  . Personal history of arthritis 08/11/2010  . Tear film insufficiency 06/07/2010  . 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 Keratoconjunctivitis sicca ([place]) 03/10/2010  . Lens replaced 03/10/2010  . Pterygium 03/10/2010   Shelton Silvas PT, DPT Shelton Silvas 09/12/2018, 1:39 PM  [facility] [address], [place], [address] Phone: [phone]   Fax:  [phone]  Name: Derek Shelton MRN: [id number] Date of Birth:  1940/10/11

## 2018-09-17 ENCOUNTER — Ambulatory Visit: Payer: Medicare Other | Admitting: Physical Therapy

## 2018-09-18 ENCOUNTER — Other Ambulatory Visit: Payer: Self-pay | Admitting: Internal Medicine

## 2018-09-18 DIAGNOSIS — R1011 Right upper quadrant pain: Secondary | ICD-10-CM

## 2018-09-19 ENCOUNTER — Other Ambulatory Visit: Payer: Self-pay

## 2018-09-19 ENCOUNTER — Encounter: Payer: Self-pay | Admitting: Physical Therapy

## 2018-09-19 ENCOUNTER — Ambulatory Visit: Payer: Medicare Other | Admitting: Physical Therapy

## 2018-09-19 DIAGNOSIS — M25562 Pain in left knee: Secondary | ICD-10-CM | POA: Diagnosis not present

## 2018-09-19 DIAGNOSIS — R262 Difficulty in walking, not elsewhere classified: Secondary | ICD-10-CM

## 2018-09-19 DIAGNOSIS — G8929 Other chronic pain: Secondary | ICD-10-CM

## 2018-09-19 NOTE — Therapy (Signed)
[place] [facility] [address] [place], [address] Phone: [phone]   Fax:  [phone]  Physical Therapy Treatment  Patient Details  Name: Derek Shelton MRN: [id number] Date of Birth: January 31, 1940 Referring Provider (PT): Dorthula Perfect Date: 09/19/2018  PT End of Session - 09/19/18 1342    Visit Number  19    Number of Visits  29    Date for PT Re-Evaluation  10/08/18    PT Start Time  0113    PT Stop Time  0145    PT Time Calculation (min)  32 min    Activity Tolerance  Patient limited by fatigue;Patient limited by pain;Treatment limited secondary to agitation    Behavior During Therapy  [facility] for tasks assessed/performed;Flat affect       Past Medical History:  Diagnosis Date  . Anemia   . Asthma   . CHF (congestive heart failure) ([place])   . COPD (chronic obstructive pulmonary disease) ([place])   . Coronary artery disease   . Dementia ([place])    Per son's report  . Diabetes mellitus without complication ([place])   . Dysrhythmia    Atrial Fibrillation  . GERD (gastroesophageal reflux disease)   . Hyperlipidemia   . Hypertension   . Hypothyroidism   . Shortness of breath dyspnea   . Sleep apnea   . Thyroid disease     Past Surgical History:  Procedure Laterality Date  . CARDIAC CATHETERIZATION    . COLONOSCOPY N/A 08/10/2014   Procedure: COLONOSCOPY;  Surgeon: Manya Silvas, MD;  Location: [facility] ENDOSCOPY;  Service: Endoscopy;  Laterality: N/A;  . COLONOSCOPY WITH PROPOFOL N/A 04/05/2016   Procedure: COLONOSCOPY WITH PROPOFOL;  Surgeon: Lollie Sails, MD;  Location: [facility] ENDOSCOPY;  Service: Endoscopy;  Laterality: N/A;  . ESOPHAGOGASTRODUODENOSCOPY N/A 08/08/2014   Procedure: ESOPHAGOGASTRODUODENOSCOPY (EGD);  Surgeon: Manya Silvas, MD;  Location: [facility] ENDOSCOPY;  Service: Endoscopy;  Laterality: N/A;  plan for early afternoon  . EYE SURGERY    . GIVENS CAPSULE STUDY N/A 08/12/2014    Procedure: GIVENS CAPSULE STUDY;  Surgeon: Manya Silvas, MD;  Location: [facility] ENDOSCOPY;  Service: Endoscopy;  Laterality: N/A;  . rectal fistula N/A     There were no vitals filed for this visit.  Subjective Assessment - 09/19/18 1340    Subjective  No complaints this afternoon.    Pertinent History  Interpreter used and wife with patient to give history d/t cognitive impairments (dementia). Patient presenting with bilat knee pain. Pertinent history of CVA 09/2017 with associated \ L sided weakness, has been using WC in community and RW in home since, though wife reports he does not like to use the RW (ambulatory prior). Pt reports bilat knee pain that has been going on for some time with exacerbation recently since he has not been having HH (since Dec). Wife reports no pain at rest, only knee pain with walking/standing. Son helps patient with bathing and dressing, as he cannot stand for longer than 83mins without falling or having pain. Is able to complete bed mobility ind, and chair <> chair transfers IND, and propel his own WC. Patient has stairs to enter and exit home and wife reports his 2 sons carry him in his WC up and down steps. Patient is continent, but uses urinal d/t decreased motivation, and adult diaper to prevent accidents. Reports highest pain level 7/10 over the past week, and best 0/10 with rest.  Limitations  Walking;Lifting;House hold activities;Standing    How long can you sit comfortably?  unlimited    How long can you stand comfortably?  88mins     How long can you walk comfortably?  67mins    Diagnostic tests  None    Pain Onset  More than a month ago       Gait Training Patient able to ambulate167ft2mins 51 sec; 23min rest break; 139ft 67mins 29secsbefore having to sit down d/t L knee pain/fatigue; 98min break Walking with speed changes "fast and slow" over 45ft with minimal speed change available, though patient does make a good attempt.  Walking with head turns  left and right over 29ft with TC for head turns needed, unable to complete with full cervical rotation ROM Cone weaving with 180d turn at the last cone; 3 cones over 85ft for increased  RW management x2 trials; 2nd trial in secs Obstacle negotiation over 6in hurdle/2in/6in in 32ft x2 trials 1/5-74min break between all gait training trials Good recall of path around clinic and normalized gait pattern with encouragement needed when fatigue sets in for R foot clearance with initial walking lap. Encouragement and TC needed for dynamic gait challenge for other trials Ambulation around clinic totaling 56ft throughout therex with cuing for R foot clearance                          PT Education - 09/19/18 1341    Person(s) Educated  Patient    Methods  Explanation;Demonstration;Verbal cues;Tactile cues    Comprehension  Verbalized understanding;Returned demonstration;Verbal cues required;Tactile cues required       PT Short Term Goals - 08/27/18 1354      PT SHORT TERM GOAL #1   Title  Pt will be independent with HEP in order to decrease ankle pain and increase strength in order to improve pain-free function at home and work.     Baseline  08/27/18 Not completing HEP    Time  4    Period  Weeks    Status  New      PT SHORT TERM GOAL #2   Title  Patient will be able to ascend/descend 2 steps with one handrail, modI to demonstrate PLOF and safety entering/exiting home    Baseline  08/27/18 ascend and descend 4 steps with 1 handrail, with step to pattern, CGA for safety    Time  4    Period  Weeks    Status  Revised      PT SHORT TERM GOAL #3   Title  Patient will be able to ambulate 58ft with LRD to demonstrate safety with ambulation over community distances    Baseline  08/27/18 130ft with RW    Time  4    Period  Weeks    Status  Achieved      PT SHORT TERM GOAL #4   Title  Pt will increase 10MWT to 0.39m/s in order to demonstrate clinically significant improvement in  household ambulation.     Baseline  08/26/28 0.31m/s    Time  4    Period  Weeks    Status  On-going      PT SHORT TERM GOAL #5   Title   Pt will decrease 5TSTS by at least 3 seconds in order to demonstrate clinically significant improvement in LE strength    Baseline  08/27/18 13sec    Time  8    Period  Weeks  Status  Achieved        PT Long Term Goals - 08/27/18 1405      PT LONG TERM GOAL #1   Title  Pt will increase LEFS by at least 9 points in order to demonstrate significant improvement in lower extremity function.     Baseline  08/27/18 15/80    Time  8    Period  Weeks    Status  On-going      PT LONG TERM GOAL #2   Title  Pt will decrease 5TSTS to 10sec to demonstrate normal LE strength    Baseline  08/27/18 13sec    Time  8    Period  Weeks    Status  On-going      PT LONG TERM GOAL #3   Title  Pt will decrease worst pain as reported on NPRS by at least 3 points in order to demonstrate clinically significant reduction in ankle/foot pain.     Baseline  08/27/18 8/10 pain in L knee only    Time  8    Period  Weeks    Status  On-going      PT LONG TERM GOAL #4   Title  Pt will increase 10MWT to 1.61m/s in order to demonstrate clinically significant improvement in community ambulation.     Baseline  08/27/18 0.23m/s    Time  8    Period  Weeks    Status  On-going      PT LONG TERM GOAL #5   Title  Patient will be able to ascend/descend 4 steps without handrail with reciprocol gait, IND, to demonstrate PLOF and safety entering/exiting home    Baseline  08/27/18 able to ascent/descend 4 steps with unilateral handrail, step to pattern ascent RLE/descent LLE, and CGA for safety    Time  8    Period  Weeks    Status  On-going      PT LONG TERM GOAL #6   Title  Patient will be able to ambulate 238ft with LRD to demonstrate safety with ambulation over community distances    Baseline  08/27/18 152ft with supervision    Time  8    Period  Weeks    Status  On-going             Plan - 09/19/18 1509    Clinical Impression Statement  Sessioned shortened d/t patient being late to arrive. PT continued gait training progression over multiple dynamic gait challeges to challenge balance, stability, and endurance. Patient is able to complete all challenges with gaurding for safety and cuing needed for proper technique.    Personal Factors and Comorbidities  Age;Comorbidity 1;Comorbidity 2;Comorbidity 3+;Past/Current Experience;Time since onset of injury/illness/exacerbation;Profession    Comorbidities  Dementia, CVA, COPD, CHF, DM2, HTN, HL, Afib, asthma    Examination-Activity Limitations  Bathing;Bed Mobility;Dressing;Stairs;Bend;Lift;Locomotion Level;Squat;Carry;Transfers;[place];Interpersonal Relationship;Yard Work;Cleaning;Laundry;Community Activity    Stability/Clinical Decision Making  Evolving/Moderate complexity    Clinical Decision Making  Moderate    Rehab Potential  Fair    PT Frequency  2x / week    PT Duration  8 weeks    PT Treatment/Interventions  ADLs/Self Care Home Management;Cryotherapy;Gait training;Therapeutic exercise;Patient/family education;Manual techniques;Passive range of motion;Joint Manipulations;Spinal Manipulations;Wheelchair mobility training;Neuromuscular re-education;Balance training;Functional mobility training;Electrical Stimulation;Moist Heat;DME Instruction;Therapeutic activities    PT Next Visit Plan  therex progression    PT Home Exercise Plan  STS, seated marching, seated abd RTB    Consulted and Agree  with Plan of Care  Patient       Patient will benefit from skilled therapeutic intervention in order to improve the following deficits and impairments:  Abnormal gait, Decreased balance, Decreased endurance, Decreased mobility, Difficulty walking, Cardiopulmonary status limiting activity, Decreased activity tolerance, Decreased coordination, Decreased strength, Decreased  safety awareness, Decreased range of motion, Improper body mechanics, Obesity, Impaired flexibility, Increased fascial restricitons, Postural dysfunction, Pain  Visit Diagnosis: Chronic pain of left knee  Chronic pain of right knee  Difficulty in walking, not elsewhere classified     Problem List Patient Active Problem List   Diagnosis Date Noted  . BPH (benign prostatic hyperplasia) 07/18/2018  . Degeneration of lumbar intervertebral disc 07/17/2018  . Osteoarthritis of knee 07/17/2018  . Spinal stenosis of lumbar region 07/17/2018  . Acute CVA (cerebrovascular accident) ([place]) 12/02/2017  . Acute ischemic stroke ([place]) 12/02/2017  . Acute metabolic encephalopathy due to hypoglycemia 11/29/2017  . Essential hypertension 11/29/2017  . Syncope and collapse 11/28/2017  . A-fib ([place]) 05/22/2017  . Chest pain 02/21/2017  . AKI (acute kidney injury) ([place]) 02/21/2017  . SOB (shortness of breath) 02/08/2017  . Atrial fibrillation with RVR ([place]) 02/08/2017  . GI bleeding 08/06/2014  . Anemia 08/06/2014  . Bradycardia 08/06/2014  . Atrial fibrillation ([place]) 08/06/2014  . Hyponatremia 08/06/2014  . Chronic diastolic heart failure ([place]) 08/06/2014  . OSA (obstructive sleep apnea) 08/06/2014  . Memory loss or impairment 05/29/2014  . Type 2 diabetes mellitus, without long-term current use of insulin ([place]) 08/11/2010  . 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 Chronic rhinitis 08/11/2010  . Coronary artery disease 08/11/2010  . Esophageal reflux 08/11/2010  . Personal history of arthritis 08/11/2010  . Tear film insufficiency 06/07/2010  . 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 Keratoconjunctivitis sicca ([place]) 03/10/2010  . Lens replaced 03/10/2010  . Pterygium 03/10/2010   [place] Silvas PT, DPT [place] Silvas 09/19/2018, 3:13 PM  [place] [facility] [address], [place], [address] Phone: [phone]    Fax:  [phone]  Name: Derek [place] MRN: [id number] Date of Birth: Jun 22, 1940

## 2018-09-20 ENCOUNTER — Ambulatory Visit
Admission: RE | Admit: 2018-09-20 | Discharge: 2018-09-20 | Disposition: A | Discharge status: Home / Self Care | Payer: Medicare Other | Source: Ambulatory Visit | Attending: Internal Medicine | Admitting: Internal Medicine

## 2018-09-20 DIAGNOSIS — R1011 Right upper quadrant pain: Secondary | ICD-10-CM | POA: Insufficient documentation

## 2018-09-24 ENCOUNTER — Ambulatory Visit: Payer: Medicare Other | Admitting: Physical Therapy

## 2018-09-24 ENCOUNTER — Encounter: Payer: Self-pay | Admitting: Physical Therapy

## 2018-09-24 ENCOUNTER — Other Ambulatory Visit: Payer: Self-pay

## 2018-09-24 DIAGNOSIS — M25562 Pain in left knee: Secondary | ICD-10-CM | POA: Diagnosis not present

## 2018-09-24 DIAGNOSIS — G8929 Other chronic pain: Secondary | ICD-10-CM

## 2018-09-24 DIAGNOSIS — R262 Difficulty in walking, not elsewhere classified: Secondary | ICD-10-CM

## 2018-09-24 NOTE — Therapy (Signed)
[place] [facility] [address], [place], [address] Phone: [phone]   Fax:  [phone]  Physical Therapy Treatment  Patient Details  Name: Derek [place] MRN: [id number] Date of Birth: 02-Jan-1941 Referring Provider (PT): Dorthula Perfect Date: 09/24/2018  PT End of Session - 09/24/18 1311    Visit Number  20    Number of Visits  29    Date for PT Re-Evaluation  10/08/18    PT Start Time  0100    PT Stop Time  0145    PT Time Calculation (min)  45 min    Activity Tolerance  Patient limited by fatigue;Patient limited by pain;Treatment limited secondary to agitation    Behavior During Therapy  [facility] for tasks assessed/performed;Flat affect       Past Medical History:  Diagnosis Date  . Anemia   . Asthma   . CHF (congestive heart failure) ([place])   . COPD (chronic obstructive pulmonary disease) ([place])   . Coronary artery disease   . Dementia ([place])    Per son's report  . Diabetes mellitus without complication ([place])   . Dysrhythmia    Atrial Fibrillation  . GERD (gastroesophageal reflux disease)   . Hyperlipidemia   . Hypertension   . Hypothyroidism   . Shortness of breath dyspnea   . Sleep apnea   . Thyroid disease     Past Surgical History:  Procedure Laterality Date  . CARDIAC CATHETERIZATION    . COLONOSCOPY N/A 08/10/2014   Procedure: COLONOSCOPY;  Surgeon: Manya Silvas, MD;  Location: [facility] ENDOSCOPY;  Service: Endoscopy;  Laterality: N/A;  . COLONOSCOPY WITH PROPOFOL N/A 04/05/2016   Procedure: COLONOSCOPY WITH PROPOFOL;  Surgeon: Lollie Sails, MD;  Location: [facility] ENDOSCOPY;  Service: Endoscopy;  Laterality: N/A;  . ESOPHAGOGASTRODUODENOSCOPY N/A 08/08/2014   Procedure: ESOPHAGOGASTRODUODENOSCOPY (EGD);  Surgeon: Manya Silvas, MD;  Location: [facility] ENDOSCOPY;  Service: Endoscopy;  Laterality: N/A;  plan for early afternoon  . EYE SURGERY    . GIVENS CAPSULE STUDY N/A 08/12/2014   Procedure: GIVENS CAPSULE STUDY;  Surgeon: Manya Silvas, MD;  Location: [facility] ENDOSCOPY;  Service: Endoscopy;  Laterality: N/A;  . rectal fistula N/A     There were no vitals filed for this visit.    Gait Training Patient able to ambulate158ft4mins32sec; 73min rest break; 124ft 21mins 55secs;74min break Cuing for encouragement to continue walking, and for foot clearance with decent carry over Patient endorses L knee pain following each walking trial Ambulation around clinic totaling 24ft throughout therex with cuing for R foot clearance    Ther-Ex Step up onto 6in step with BUE support 3x 6/7/6 with guarding for safety; cuing needed for full stand/ hip ext with good carry over following Standing without UE support with alt nose taps x20; with narrow BOS x20; seated rest break needed between Alt standing marching with BUE support at RW 3x 10 TC for full available ROM                     PT Education - 09/24/18 1332    Education Details  therex form, gait training    Person(s) Educated  Patient    Methods  Explanation;Demonstration;Verbal cues;Tactile cues    Comprehension  Verbalized understanding;Returned demonstration;Verbal cues required;Tactile cues required       PT Short Term Goals - 08/27/18 1354      PT SHORT TERM GOAL #1   Title  Pt will be independent with HEP in order to decrease ankle pain and increase strength in order to improve pain-free function at home and work.     Baseline  08/27/18 Not completing HEP    Time  4    Period  Weeks    Status  New      PT SHORT TERM GOAL #2   Title  Patient will be able to ascend/descend 2 steps with one handrail, modI to demonstrate PLOF and safety entering/exiting home    Baseline  08/27/18 ascend and descend 4 steps with 1 handrail, with step to pattern, CGA for safety    Time  4    Period  Weeks    Status  Revised      PT SHORT TERM GOAL #3   Title  Patient will be able to ambulate 11ft with LRD to  demonstrate safety with ambulation over community distances    Baseline  08/27/18 188ft with RW    Time  4    Period  Weeks    Status  Achieved      PT SHORT TERM GOAL #4   Title  Pt will increase 10MWT to 0.20m/s in order to demonstrate clinically significant improvement in household ambulation.     Baseline  08/26/28 0.64m/s    Time  4    Period  Weeks    Status  On-going      PT SHORT TERM GOAL #5   Title   Pt will decrease 5TSTS by at least 3 seconds in order to demonstrate clinically significant improvement in LE strength    Baseline  08/27/18 13sec    Time  8    Period  Weeks    Status  Achieved        PT Long Term Goals - 08/27/18 1405      PT LONG TERM GOAL #1   Title  Pt will increase LEFS by at least 9 points in order to demonstrate significant improvement in lower extremity function.     Baseline  08/27/18 15/80    Time  8    Period  Weeks    Status  On-going      PT LONG TERM GOAL #2   Title  Pt will decrease 5TSTS to 10sec to demonstrate normal LE strength    Baseline  08/27/18 13sec    Time  8    Period  Weeks    Status  On-going      PT LONG TERM GOAL #3   Title  Pt will decrease worst pain as reported on NPRS by at least 3 points in order to demonstrate clinically significant reduction in ankle/foot pain.     Baseline  08/27/18 8/10 pain in L knee only    Time  8    Period  Weeks    Status  On-going      PT LONG TERM GOAL #4   Title  Pt will increase 10MWT to 1.45m/s in order to demonstrate clinically significant improvement in community ambulation.     Baseline  08/27/18 0.43m/s    Time  8    Period  Weeks    Status  On-going      PT LONG TERM GOAL #5   Title  Patient will be able to ascend/descend 4 steps without handrail with reciprocol gait, IND, to demonstrate PLOF and safety entering/exiting home    Baseline  08/27/18 able to ascent/descend 4 steps with unilateral handrail, step to pattern ascent  RLE/descent LLE, and CGA for safety    Time  8    Period   Weeks    Status  On-going      PT LONG TERM GOAL #6   Title  Patient will be able to ambulate 233ft with LRD to demonstrate safety with ambulation over community distances    Baseline  08/27/18 158ft with supervision    Time  8    Period  Weeks    Status  On-going            Plan - 09/24/18 1339    Clinical Impression Statement  Patient able toincrease walking distance this session with encouragement. Patient and PT pleased with this progress. PT continued therex progression for LE strengthening and balance, with patient able to comply with cuing for proper techniqu. Patient motivated throughout session, with definite need for encoruagement. PT will continue progression as able.    Personal Factors and Comorbidities  Age;Comorbidity 1;Comorbidity 2;Comorbidity 3+;Past/Current Experience;Time since onset of injury/illness/exacerbation;Profession    Examination-Activity Limitations  Bathing;Bed Mobility;Dressing;Stairs;Bend;Lift;Locomotion Level;Squat;Carry;Transfers;[place];Interpersonal Relationship;Yard Work;Cleaning;Laundry;Community Activity    Stability/Clinical Decision Making  Evolving/Moderate complexity    Clinical Decision Making  Moderate    Rehab Potential  Fair    PT Frequency  2x / week    PT Duration  8 weeks    PT Treatment/Interventions  ADLs/Self Care Home Management;Cryotherapy;Gait training;Therapeutic exercise;Patient/family education;Manual techniques;Passive range of motion;Joint Manipulations;Spinal Manipulations;Wheelchair mobility training;Neuromuscular re-education;Balance training;Functional mobility training;Electrical Stimulation;Moist Heat;DME Instruction;Therapeutic activities    PT Next Visit Plan  therex progression    PT Home Exercise Plan  STS, seated marching, seated abd RTB    Consulted and Agree with Plan of Care  Patient       Patient will benefit from skilled therapeutic intervention in order  to improve the following deficits and impairments:  Abnormal gait, Decreased balance, Decreased endurance, Decreased mobility, Difficulty walking, Cardiopulmonary status limiting activity, Decreased activity tolerance, Decreased coordination, Decreased strength, Decreased safety awareness, Decreased range of motion, Improper body mechanics, Obesity, Impaired flexibility, Increased fascial restricitons, Postural dysfunction, Pain  Visit Diagnosis: Chronic pain of left knee  Chronic pain of right knee  Difficulty in walking, not elsewhere classified     Problem List Patient Active Problem List   Diagnosis Date Noted  . BPH (benign prostatic hyperplasia) 07/18/2018  . Degeneration of lumbar intervertebral disc 07/17/2018  . Osteoarthritis of knee 07/17/2018  . Spinal stenosis of lumbar region 07/17/2018  . Acute CVA (cerebrovascular accident) ([place]) 12/02/2017  . Acute ischemic stroke ([place]) 12/02/2017  . Acute metabolic encephalopathy due to hypoglycemia 11/29/2017  . Essential hypertension 11/29/2017  . Syncope and collapse 11/28/2017  . A-fib ([place]) 05/22/2017  . Chest pain 02/21/2017  . AKI (acute kidney injury) ([place]) 02/21/2017  . SOB (shortness of breath) 02/08/2017  . Atrial fibrillation with RVR ([place]) 02/08/2017  . GI bleeding 08/06/2014  . Anemia 08/06/2014  . Bradycardia 08/06/2014  . Atrial fibrillation ([place]) 08/06/2014  . Hyponatremia 08/06/2014  . Chronic diastolic heart failure ([place]) 08/06/2014  . OSA (obstructive sleep apnea) 08/06/2014  . Memory loss or impairment 05/29/2014  . Type 2 diabetes mellitus, without long-term current use of insulin ([place]) 08/11/2010  . 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 Chronic rhinitis 08/11/2010  . Coronary artery disease 08/11/2010  . Esophageal reflux 08/11/2010  . Personal history of arthritis 08/11/2010  . Tear film insufficiency 06/07/2010  . 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 Keratoconjunctivitis sicca ([place])  03/10/2010  . Lens replaced 03/10/2010  . Pterygium 03/10/2010   [place] Silvas PT, DPT [place] Silvas 09/24/2018, 1:45 PM  [place] [facility] [address], [place], [address] Phone: [phone]   Fax:  [phone]  Name: Derek [place] MRN: [id number] Date of Birth: [birth year]

## 2018-09-27 ENCOUNTER — Encounter: Payer: Medicare Other | Admitting: Physical Therapy

## 2018-10-02 ENCOUNTER — Ambulatory Visit: Payer: Medicare Other | Attending: Internal Medicine | Admitting: Physical Therapy

## 2018-10-02 DIAGNOSIS — G8929 Other chronic pain: Secondary | ICD-10-CM | POA: Insufficient documentation

## 2018-10-02 DIAGNOSIS — R262 Difficulty in walking, not elsewhere classified: Secondary | ICD-10-CM | POA: Insufficient documentation

## 2018-10-02 DIAGNOSIS — M25562 Pain in left knee: Secondary | ICD-10-CM | POA: Insufficient documentation

## 2018-10-02 DIAGNOSIS — M25561 Pain in right knee: Secondary | ICD-10-CM | POA: Insufficient documentation

## 2018-10-04 ENCOUNTER — Ambulatory Visit: Payer: Medicare Other | Admitting: Physical Therapy

## 2018-10-08 ENCOUNTER — Encounter: Payer: Self-pay | Admitting: Physical Therapy

## 2018-10-08 ENCOUNTER — Other Ambulatory Visit: Payer: Self-pay

## 2018-10-08 ENCOUNTER — Ambulatory Visit: Payer: Medicare Other | Admitting: Physical Therapy

## 2018-10-08 DIAGNOSIS — M25562 Pain in left knee: Secondary | ICD-10-CM | POA: Diagnosis present

## 2018-10-08 DIAGNOSIS — R262 Difficulty in walking, not elsewhere classified: Secondary | ICD-10-CM

## 2018-10-08 DIAGNOSIS — M25561 Pain in right knee: Secondary | ICD-10-CM

## 2018-10-08 DIAGNOSIS — G8929 Other chronic pain: Secondary | ICD-10-CM | POA: Diagnosis present

## 2018-10-08 NOTE — Therapy (Signed)
[facility] [address], [place], [address] Phone: [phone]   Fax:  [phone]  Physical Therapy Treatment  Patient Details  Name: Derek Shelton MRN: [id number] Date of Birth: Jul 15, 1940 Referring Provider (PT): Dorthula Perfect Date: 10/08/2018  PT End of Session - 10/08/18 1144    Visit Number  21    Number of Visits  29    Date for PT Re-Evaluation  10/08/18    PT Start Time  1119    PT Stop Time  1150    PT Time Calculation (min)  31 min    Activity Tolerance  Patient limited by fatigue;Patient limited by pain;Treatment limited secondary to agitation    Behavior During Therapy  [facility] for tasks assessed/performed;Flat affect       Past Medical History:  Diagnosis Date  . Anemia   . Asthma   . CHF (congestive heart failure) ([place])   . COPD (chronic obstructive pulmonary disease) ([place])   . Coronary artery disease   . Dementia ([place])    Per son's report  . Diabetes mellitus without complication ([place])   . Dysrhythmia    Atrial Fibrillation  . GERD (gastroesophageal reflux disease)   . Hyperlipidemia   . Hypertension   . Hypothyroidism   . Shortness of breath dyspnea   . Sleep apnea   . Thyroid disease     Past Surgical History:  Procedure Laterality Date  . CARDIAC CATHETERIZATION    . COLONOSCOPY N/A 08/10/2014   Procedure: COLONOSCOPY;  Surgeon: Manya Silvas, MD;  Location: [facility] ENDOSCOPY;  Service: Endoscopy;  Laterality: N/A;  . COLONOSCOPY WITH PROPOFOL N/A 04/05/2016   Procedure: COLONOSCOPY WITH PROPOFOL;  Surgeon: Lollie Sails, MD;  Location: [facility] ENDOSCOPY;  Service: Endoscopy;  Laterality: N/A;  . ESOPHAGOGASTRODUODENOSCOPY N/A 08/08/2014   Procedure: ESOPHAGOGASTRODUODENOSCOPY (EGD);  Surgeon: Manya Silvas, MD;  Location: [facility] ENDOSCOPY;  Service: Endoscopy;  Laterality: N/A;  plan for early afternoon  . EYE SURGERY    . GIVENS CAPSULE STUDY N/A 08/12/2014    Procedure: GIVENS CAPSULE STUDY;  Surgeon: Manya Silvas, MD;  Location: [facility] ENDOSCOPY;  Service: Endoscopy;  Laterality: N/A;  . rectal fistula N/A     There were no vitals filed for this visit.  Subjective Assessment - 10/08/18 1508    Subjective  Minimal compliance with HEP, some L knee pain.    Pertinent History  Interpreter used and wife with patient to give history d/t cognitive impairments (dementia). Patient presenting with bilat knee pain. Pertinent history of CVA 09/2017 with associated \ L sided weakness, has been using WC in community and RW in home since, though wife reports he does not like to use the RW (ambulatory prior). Pt reports bilat knee pain that has been going on for some time with exacerbation recently since he has not been having HH (since Dec). Wife reports no pain at rest, only knee pain with walking/standing. Son helps patient with bathing and dressing, as he cannot stand for longer than 16mns without falling or having pain. Is able to complete bed mobility ind, and chair <> chair transfers IND, and propel his own WC. Patient has stairs to enter and exit home and wife reports his 2 sons carry him in his WC up and down steps. Patient is continent, but uses urinal d/t decreased motivation, and adult diaper to prevent accidents. Reports highest pain level 7/10 over the past week, and best 0/10  with rest.     How long can you sit comfortably?  unlimited    How long can you stand comfortably?  64mns     How long can you walk comfortably?  574ms    Diagnostic tests  None         Gait Training Patient able to ambulate10076fins54sec; 2mi73mest break; 40ft21fund clinic to steps Stair negotiation trials with patient unwilling to attempt with 1 handrail initially, able following PT demo to complete with modI with 1 handrail, following 3 trials,  with step to pattern (RLE leading ascent, LLE leading descent     Ther-Ex STS 2x 5 completing trials for speed, best time  13sec Alt standing marching with BUE support at RW 2x 10 TC for full available ROM Standing with RW hip abd 2x 10 with cuing for eccentric control with good carry over following, completes best mirroring PT                       PT Education - 10/08/18 1509    Education Details  gait training, stair negotiation, therex form, HEP    Person(s) Educated  Patient    Methods  Explanation;Demonstration;Tactile cues;Verbal cues    Comprehension  Verbalized understanding;Returned demonstration;Verbal cues required;Tactile cues required       PT Short Term Goals - 10/08/18 1127      PT SHORT TERM GOAL #1   Title  Pt will be independent with HEP in order to decrease ankle pain and increase strength in order to improve pain-free function at home and work.     Baseline  10/08/18 Not completing HEP    Time  4    Period  Weeks    Status  Not Met      PT SHORT TERM GOAL #2   Title  Patient will be able to ascend/descend 2 steps with one handrail, modI to demonstrate PLOF and safety entering/exiting home    Baseline  08/27/18 ascend and descend 4 steps with 1 handrail, with step to pattern, modI    Period  Weeks    Status  Achieved      PT SHORT TERM GOAL #3   Title  Patient will be able to ambulate 50ft 28f LRD to demonstrate safety with ambulation over community distances    Baseline  08/27/18 100ft w16fRW    Time  4    Period  Weeks    Status  Achieved      PT SHORT TERM GOAL #4   Title  Pt will increase 10MWT to 0.28m/s in57mder to demonstrate clinically significant improvement in household ambulation.     Baseline  10/08/18 0.54m/s    29mT SHORT TERM GOAL #5   Title   Pt will decrease 5TSTS by at least 3 seconds in order to demonstrate clinically significant improvement in LE strength    Time  8    Period  Weeks    Status  Achieved        PT Long Term Goals - 10/08/18 1130      PT LONG TERM GOAL #1   Title  Pt will increase LEFS by at least 9 points in order to  demonstrate significant improvement in lower extremity function.     Baseline  08/27/18 15/80    Time  8    Period  Weeks    Status  Not Met      PT LONG TERM GOAL #2  Title  Pt will decrease 5TSTS to 10sec to demonstrate normal LE strength    Baseline  10/08/18 13sec    Time  8    Period  Weeks    Status  Not Met      PT LONG TERM GOAL #3   Title  Pt will decrease worst pain as reported on NPRS by at least 3 points in order to demonstrate clinically significant reduction in ankle/foot pain.     Baseline  08/27/18 7/10 pain in L knee only    Time  8    Period  Weeks    Status  Not Met      PT LONG TERM GOAL #4   Title  Pt will increase 10MWT to 1.59ms in order to demonstrate clinically significant improvement in community ambulation.     Baseline  10/08/18 0.68m    Time  8    Period  Weeks    Status  Not Met      PT LONG TERM GOAL #5   Title  Patient will be able to ascend/descend 4 steps without handrail with reciprocol gait, IND, to demonstrate PLOF and safety entering/exiting home    Baseline  10/08/18 unable to ascend/descend any steps without handrail, reports "too much pain"    Time  8    Period  Weeks    Status  Not Met      PT LONG TERM GOAL #6   Title  Patient will be able to ambulate 2507fith LRD to demonstrate safety with ambulation over community distances    Baseline  10/08/18 125f69fth supervision, patient impulsively sits when he is tired    Time  8    Period  Weeks    Status  Not Met            Plan - 10/08/18 1510    Clinical Impression Statement  PT reassessed goals this visit per date of re-cert. Pt has not met a lot of long term, more ambitious goals from PT. Demonstrates slow but steady progress toward goals with plateau d/t lack of consistency with HEP and attendance with PT. Patient has made progress toward goals and does exhibit ind ambulating with RW household distances and with stair negotiation to demonstrate independence in his house, but is  inhibited in further progress d/t lack of motivation, and consistency, but he seems content with his current level of function. All exercises from today's session given to patient in handout for HEP, patietn to d/c PT.    Personal Factors and Comorbidities  Age;Comorbidity 1;Comorbidity 2;Comorbidity 3+;Past/Current Experience;Time since onset of injury/illness/exacerbation;Profession    Comorbidities  Dementia, CVA, COPD, CHF, DM2, HTN, HL, Afib, asthma    Examination-Activity Limitations  Bathing;Bed Mobility;Dressing;Stairs;Bend;Lift;Locomotion Level;Squat;Carry;Transfers;ToilWhite Eartherpersonal Relationship;Yard Work;Cleaning;Laundry;Community Activity    Stability/Clinical Decision Making  Evolving/Moderate complexity    Clinical Decision Making  Moderate    Rehab Potential  Good    PT Frequency  2x / week    PT Duration  8 weeks    PT Treatment/Interventions  ADLs/Self Care Home Management;Cryotherapy;Gait training;Therapeutic exercise;Patient/family education;Manual techniques;Passive range of motion;Joint Manipulations;Spinal Manipulations;Wheelchair mobility training;Neuromuscular re-education;Balance training;Functional mobility training;Electrical Stimulation;Moist Heat;DME Instruction;Therapeutic activities    PT Next Visit Plan  therex progression    PT Home Exercise Plan  STS, seated marching, seated abd RTB    Consulted and Agree with Plan of Care  Patient       Patient will benefit from skilled therapeutic intervention  in order to improve the following deficits and impairments:  Abnormal gait, Decreased balance, Decreased endurance, Decreased mobility, Difficulty walking, Cardiopulmonary status limiting activity, Decreased activity tolerance, Decreased coordination, Decreased strength, Decreased safety awareness, Decreased range of motion, Improper body mechanics, Obesity, Impaired flexibility, Increased fascial restricitons, Postural  dysfunction, Pain  Visit Diagnosis: Chronic pain of left knee  Chronic pain of right knee  Difficulty in walking, not elsewhere classified     Problem List Patient Active Problem List   Diagnosis Date Noted  . BPH (benign prostatic hyperplasia) 07/18/2018  . Degeneration of lumbar intervertebral disc 07/17/2018  . Osteoarthritis of knee 07/17/2018  . Spinal stenosis of lumbar region 07/17/2018  . Acute CVA (cerebrovascular accident) ([place]) 12/02/2017  . Acute ischemic stroke ([place]) 12/02/2017  . Acute metabolic encephalopathy due to hypoglycemia 11/29/2017  . Essential hypertension 11/29/2017  . Syncope and collapse 11/28/2017  . A-fib ([place]) 05/22/2017  . Chest pain 02/21/2017  . AKI (acute kidney injury) ([place]) 02/21/2017  . SOB (shortness of breath) 02/08/2017  . Atrial fibrillation with RVR ([place]) 02/08/2017  . GI bleeding 08/06/2014  . Anemia 08/06/2014  . Bradycardia 08/06/2014  . Atrial fibrillation ([place]) 08/06/2014  . Hyponatremia 08/06/2014  . Chronic diastolic heart failure ([place]) 08/06/2014  . OSA (obstructive sleep apnea) 08/06/2014  . Memory loss or impairment 05/29/2014  . Type 2 diabetes mellitus, without long-term current use of insulin ([place]) 08/11/2010  . 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 Chronic rhinitis 08/11/2010  . Coronary artery disease 08/11/2010  . Esophageal reflux 08/11/2010  . Personal history of arthritis 08/11/2010  . Tear film insufficiency 06/07/2010  . 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 Keratoconjunctivitis sicca ([place]) 03/10/2010  . Lens replaced 03/10/2010  . Pterygium 03/10/2010   Shelton Silvas PT, DPT Shelton Silvas 10/08/2018, 3:17 PM  [facility] [address], [place], [address] Phone: [phone]   Fax:  [phone]  Name: Derek Shelton MRN: [id number] Date of Birth: 06/07/1940

## 2018-10-09 ENCOUNTER — Encounter: Payer: Medicare Other | Admitting: Physical Therapy

## 2018-10-11 ENCOUNTER — Encounter: Payer: Medicare Other | Admitting: Physical Therapy

## 2018-10-16 ENCOUNTER — Encounter: Payer: Medicare Other | Admitting: Physical Therapy

## 2018-10-18 ENCOUNTER — Encounter: Payer: Medicare Other | Admitting: Physical Therapy

## 2018-10-23 ENCOUNTER — Encounter: Payer: Medicare Other | Admitting: Physical Therapy

## 2018-10-25 ENCOUNTER — Encounter: Payer: Medicare Other | Admitting: Physical Therapy

## 2018-11-01 DIAGNOSIS — D320 Benign neoplasm of cerebral meninges: Secondary | ICD-10-CM | POA: Insufficient documentation

## 2018-11-15 ENCOUNTER — Other Ambulatory Visit: Payer: Self-pay | Admitting: Neurosurgery

## 2018-11-15 DIAGNOSIS — D329 Benign neoplasm of meninges, unspecified: Secondary | ICD-10-CM

## 2018-11-20 ENCOUNTER — Ambulatory Visit: Payer: Medicare Other

## 2018-11-22 ENCOUNTER — Ambulatory Visit: Payer: Medicare Other

## 2018-12-03 ENCOUNTER — Other Ambulatory Visit: Payer: Self-pay

## 2018-12-03 ENCOUNTER — Ambulatory Visit
Admission: RE | Admit: 2018-12-03 | Discharge: 2018-12-03 | Disposition: A | Discharge status: Home / Self Care | Payer: Medicare Other | Source: Ambulatory Visit | Attending: Neurosurgery | Admitting: Neurosurgery

## 2018-12-03 DIAGNOSIS — D329 Benign neoplasm of meninges, unspecified: Secondary | ICD-10-CM | POA: Insufficient documentation

## 2019-01-04 ENCOUNTER — Other Ambulatory Visit: Payer: Self-pay

## 2019-01-04 DIAGNOSIS — N182 Chronic kidney disease, stage 2 (mild): Secondary | ICD-10-CM | POA: Insufficient documentation

## 2019-01-29 ENCOUNTER — Ambulatory Visit: Payer: Medicare Other | Attending: Internal Medicine | Admitting: Physical Therapy

## 2019-01-31 ENCOUNTER — Ambulatory Visit: Payer: Medicare Other | Admitting: Physical Therapy

## 2019-02-05 ENCOUNTER — Encounter: Payer: Medicare Other | Admitting: Physical Therapy

## 2019-02-07 ENCOUNTER — Encounter: Payer: Medicare Other | Admitting: Physical Therapy

## 2019-02-12 ENCOUNTER — Encounter: Payer: Medicare Other | Admitting: Physical Therapy

## 2019-02-14 ENCOUNTER — Encounter: Payer: Medicare Other | Admitting: Physical Therapy

## 2019-02-19 ENCOUNTER — Encounter: Payer: Medicare Other | Admitting: Physical Therapy

## 2019-02-21 ENCOUNTER — Encounter: Payer: Medicare Other | Admitting: Physical Therapy

## 2019-02-26 ENCOUNTER — Encounter: Payer: Medicare Other | Admitting: Physical Therapy

## 2019-02-28 ENCOUNTER — Encounter: Payer: Medicare Other | Admitting: Physical Therapy

## 2019-03-04 DIAGNOSIS — N1831 Chronic kidney disease, stage 3a: Secondary | ICD-10-CM | POA: Insufficient documentation

## 2019-03-04 DIAGNOSIS — E1129 Type 2 diabetes mellitus with other diabetic kidney complication: Secondary | ICD-10-CM | POA: Insufficient documentation

## 2019-03-04 DIAGNOSIS — R339 Retention of urine, unspecified: Secondary | ICD-10-CM | POA: Insufficient documentation

## 2019-03-04 DIAGNOSIS — R809 Proteinuria, unspecified: Secondary | ICD-10-CM | POA: Insufficient documentation

## 2019-04-02 DIAGNOSIS — E876 Hypokalemia: Secondary | ICD-10-CM | POA: Insufficient documentation

## 2019-04-06 ENCOUNTER — Emergency Department: Payer: Medicare Other

## 2019-04-06 ENCOUNTER — Other Ambulatory Visit: Payer: Self-pay

## 2019-04-06 ENCOUNTER — Emergency Department
Admission: EM | Admit: 2019-04-06 | Discharge: 2019-04-06 | Disposition: A | Discharge status: Home / Self Care | Payer: Medicare Other | Attending: Emergency Medicine | Admitting: Emergency Medicine

## 2019-04-06 DIAGNOSIS — E860 Dehydration: Secondary | ICD-10-CM | POA: Insufficient documentation

## 2019-04-06 DIAGNOSIS — R531 Weakness: Secondary | ICD-10-CM | POA: Insufficient documentation

## 2019-04-06 DIAGNOSIS — Z79899 Other long term (current) drug therapy: Secondary | ICD-10-CM | POA: Diagnosis not present

## 2019-04-06 DIAGNOSIS — I110 Hypertensive heart disease with heart failure: Secondary | ICD-10-CM | POA: Diagnosis not present

## 2019-04-06 DIAGNOSIS — E119 Type 2 diabetes mellitus without complications: Secondary | ICD-10-CM | POA: Insufficient documentation

## 2019-04-06 DIAGNOSIS — R05 Cough: Secondary | ICD-10-CM | POA: Insufficient documentation

## 2019-04-06 DIAGNOSIS — Z7901 Long term (current) use of anticoagulants: Secondary | ICD-10-CM | POA: Diagnosis not present

## 2019-04-06 DIAGNOSIS — I509 Heart failure, unspecified: Secondary | ICD-10-CM | POA: Insufficient documentation

## 2019-04-06 DIAGNOSIS — Z87891 Personal history of nicotine dependence: Secondary | ICD-10-CM | POA: Diagnosis not present

## 2019-04-06 DIAGNOSIS — Z794 Long term (current) use of insulin: Secondary | ICD-10-CM | POA: Diagnosis not present

## 2019-04-06 DIAGNOSIS — E039 Hypothyroidism, unspecified: Secondary | ICD-10-CM | POA: Diagnosis not present

## 2019-04-06 DIAGNOSIS — J449 Chronic obstructive pulmonary disease, unspecified: Secondary | ICD-10-CM | POA: Diagnosis not present

## 2019-04-06 LAB — CBC
HCT: 34.2 % — ABNORMAL LOW (ref 39.0–52.0)
Hemoglobin: 11 g/dL — ABNORMAL LOW (ref 13.0–17.0)
MCH: 28.5 pg (ref 26.0–34.0)
MCHC: 32.2 g/dL (ref 30.0–36.0)
MCV: 88.6 fL (ref 80.0–100.0)
Platelets: 167 10*3/uL (ref 150–400)
RBC: 3.86 MIL/uL — ABNORMAL LOW (ref 4.22–5.81)
RDW: 14.6 % (ref 11.5–15.5)
WBC: 8.2 10*3/uL (ref 4.0–10.5)
nRBC: 0 % (ref 0.0–0.2)

## 2019-04-06 LAB — URINALYSIS, COMPLETE (UACMP) WITH MICROSCOPIC
Bilirubin Urine: NEGATIVE
Glucose, UA: NEGATIVE mg/dL
Hgb urine dipstick: NEGATIVE
Ketones, ur: NEGATIVE mg/dL
Leukocytes,Ua: NEGATIVE
Nitrite: NEGATIVE
Protein, ur: NEGATIVE mg/dL
Specific Gravity, Urine: 1.012 (ref 1.005–1.030)
WBC, UA: NONE SEEN WBC/hpf (ref 0–5)
pH: 5 (ref 5.0–8.0)

## 2019-04-06 LAB — BASIC METABOLIC PANEL
Anion gap: 8 (ref 5–15)
BUN: 27 mg/dL — ABNORMAL HIGH (ref 8–23)
CO2: 29 mmol/L (ref 22–32)
Calcium: 8.5 mg/dL — ABNORMAL LOW (ref 8.9–10.3)
Chloride: 98 mmol/L (ref 98–111)
Creatinine, Ser: 1.32 mg/dL — ABNORMAL HIGH (ref 0.61–1.24)
GFR calc Af Amer: 59 mL/min — ABNORMAL LOW (ref >60)
GFR calc non Af Amer: 51 mL/min — ABNORMAL LOW (ref >60)
Glucose, Bld: 140 mg/dL — ABNORMAL HIGH (ref 70–99)
Potassium: 4.1 mmol/L (ref 3.5–5.1)
Sodium: 135 mmol/L (ref 135–145)

## 2019-04-06 LAB — GLUCOSE, CAPILLARY: Glucose-Capillary: 121 mg/dL — ABNORMAL HIGH (ref 70–99)

## 2019-04-06 LAB — TROPONIN I (HIGH SENSITIVITY)
Troponin I (High Sensitivity): 21 ng/L — ABNORMAL HIGH (ref <18)
Troponin I (High Sensitivity): 21 ng/L — ABNORMAL HIGH (ref <18)

## 2019-04-06 LAB — DIGOXIN LEVEL: Digoxin Level: 0.5 ng/mL — ABNORMAL LOW (ref 0.8–2.0)

## 2019-04-06 MED ORDER — SODIUM CHLORIDE 0.9 % IV BOLUS: 500.0000 mL | Freq: Once | INTRAVENOUS | Status: DC

## 2019-04-06 NOTE — ED Triage Notes (Signed)
Pt family reports that patient received 1st vaccination for COVID Wednesday, family reports patient has since experienced fatigue, cough, and congestion. PT son also reports that pt has history of Afib and is on digoxin for rate control. Pt has history of dementia and speaks little [place]. Pt arrives with mask in place and no apparent distress.

## 2019-04-06 NOTE — ED Provider Notes (Signed)
[facility] Emergency Department Provider Note  Time seen: 8:28 PM  I have reviewed the triage vital signs and the nursing notes.   HISTORY  Chief Complaint Weakness and Cough   HPI Derek Shelton is a 79 y.o. male with a past medical history of anemia, asthma, dementia, COPD, CAD, diabetes, atrial fibrillation recently placed on digoxin, hypertension, hyperlipidemia, presents to the emergency department for generalized fatigue.  According to the son who is a physician patient received his second Covid vaccine 3 days ago.  He has noticed the patient has been somewhat fatigued over his baseline since that time and has had a slight cough.  He also states the patient has been having issues with his atrial fibrillation initially tachycardic and several medications were added, became bradycardic and the patient was taken off several medications and became tachycardic once again and the patient was recently placed on digoxin.  Patient has dementia and cannot contribute significantly to his history or review of systems.  Son provides a good history for the patient.  Past Medical History:  Diagnosis Date  . Anemia   . Asthma   . CHF (congestive heart failure) ([place])   . COPD (chronic obstructive pulmonary disease) ([place])   . Coronary artery disease   . Dementia ([place])    Per son's report  . Diabetes mellitus without complication ([place])   . Dysrhythmia    Atrial Fibrillation  . GERD (gastroesophageal reflux disease)   . Hyperlipidemia   . Hypertension   . Hypothyroidism   . Shortness of breath dyspnea   . Sleep apnea   . Thyroid disease     Patient Active Problem List   Diagnosis Date Noted  . BPH (benign prostatic hyperplasia) 07/18/2018  . Degeneration of lumbar intervertebral disc 07/17/2018  . Osteoarthritis of knee 07/17/2018  . Spinal stenosis of lumbar region 07/17/2018  . Acute CVA (cerebrovascular accident) ([place]) 12/02/2017  . Acute ischemic stroke  ([place]) 12/02/2017  . Acute metabolic encephalopathy due to hypoglycemia 11/29/2017  . Essential hypertension 11/29/2017  . Syncope and collapse 11/28/2017  . A-fib ([place]) 05/22/2017  . Chest pain 02/21/2017  . AKI (acute kidney injury) ([place]) 02/21/2017  . SOB (shortness of breath) 02/08/2017  . Atrial fibrillation with RVR ([place]) 02/08/2017  . GI bleeding 08/06/2014  . Anemia 08/06/2014  . Bradycardia 08/06/2014  . Atrial fibrillation ([place]) 08/06/2014  . Hyponatremia 08/06/2014  . Chronic diastolic heart failure ([place]) 08/06/2014  . OSA (obstructive sleep apnea) 08/06/2014  . Memory loss or impairment 05/29/2014  . Type 2 diabetes mellitus, without long-term current use of insulin ([place]) 08/11/2010  . 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 Chronic rhinitis 08/11/2010  . Coronary artery disease 08/11/2010  . Esophageal reflux 08/11/2010  . Personal history of arthritis 08/11/2010  . Tear film insufficiency 06/07/2010  . 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 Keratoconjunctivitis sicca ([place]) 03/10/2010  . Lens replaced 03/10/2010  . Pterygium 03/10/2010    Past Surgical History:  Procedure Laterality Date  . CARDIAC CATHETERIZATION    . COLONOSCOPY N/A 08/10/2014   Procedure: COLONOSCOPY;  Surgeon: Manya Silvas, MD;  Location: [facility] ENDOSCOPY;  Service: Endoscopy;  Laterality: N/A;  . COLONOSCOPY WITH PROPOFOL N/A 04/05/2016   Procedure: COLONOSCOPY WITH PROPOFOL;  Surgeon: Lollie Sails, MD;  Location: [facility] ENDOSCOPY;  Service: Endoscopy;  Laterality: N/A;  . ESOPHAGOGASTRODUODENOSCOPY N/A 08/08/2014   Procedure: ESOPHAGOGASTRODUODENOSCOPY (EGD);  Surgeon: Manya Silvas, MD;  Location: [facility] ENDOSCOPY;  Service: Endoscopy;  Laterality: N/A;  plan for early afternoon  . EYE SURGERY    . GIVENS CAPSULE STUDY N/A 08/12/2014   Procedure: GIVENS CAPSULE STUDY;  Surgeon: Manya Silvas, MD;  Location: [facility] ENDOSCOPY;  Service: Endoscopy;  Laterality: N/A;  . rectal  fistula N/A     Prior to Admission medications   Medication Sig Start Date End Date Taking? Authorizing Provider  apixaban (ELIQUIS) 5 MG TABS tablet Take 1 tablet (5 mg total) by mouth 2 (two) times daily. 05/23/17   [name], MD  atorvastatin (LIPITOR) 40 MG tablet Take 1 tablet (40 mg total) by mouth daily at 6 PM. 12/03/17   Demetrios Loll, MD  benzonatate (TESSALON) 100 MG capsule Take 100 mg by mouth 3 (three) times daily as needed for cough.    [provider]  budesonide-formoterol (SYMBICORT) 160-4.5 MCG/ACT inhaler Inhale 2 puffs into the lungs 2 (two) times daily.    [provider]  digoxin (LANOXIN) 0.125 MG tablet Take 1 tablet (0.125 mg total) by mouth daily. 12/08/17   Hillary Bow, MD  donepezil (ARICEPT) 5 MG tablet Take 5 mg by mouth at bedtime.    [provider]  DULoxetine (CYMBALTA) 60 MG capsule Take 60 mg by mouth daily.    [provider]  esomeprazole (NEXIUM) 40 MG capsule Take 40 mg by mouth daily at 12 noon.    [provider]  ferrous sulfate 325 (65 FE) MG EC tablet Take 325 mg by mouth daily.    [provider]  finasteride (PROSCAR) 5 MG tablet Take 1 tablet (5 mg total) by mouth daily. 07/18/18   Stoioff, Ronda Fairly, MD  furosemide (LASIX) 20 MG tablet Take 20 mg by mouth 2 (two) times daily.    [provider]  glimepiride (AMARYL) 2 MG tablet Take 3 mg by mouth 2 (two) times daily.     [provider]  guaiFENesin-dextromethorphan (ROBITUSSIN DM) 100-10 MG/5ML syrup Take 5 mLs by mouth every 4 (four) hours as needed for cough. 05/23/17   [name], MD  insulin degludec (TRESIBA FLEXTOUCH) 100 UNIT/ML SOPN FlexTouch Pen Inject 15 Units into the skin at bedtime.    [provider]  ipratropium-albuterol (DUONEB) 0.5-2.5 (3) MG/3ML SOLN Inhale 3 mLs into the lungs every 6 (six) hours as needed for shortness of breath. 01/22/17   [provider]  levothyroxine (SYNTHROID,  LEVOTHROID) 112 MCG tablet Take 112 mcg by mouth daily before breakfast.     [provider]  Melatonin 3 MG TABS Take 3 mg by mouth at bedtime as needed (sleep).     [provider]  metoprolol tartrate (LOPRESSOR) 100 MG tablet Take 1 tablet (100 mg total) by mouth 2 (two) times daily. 02/11/17   [name], MD  montelukast (SINGULAIR) 10 MG tablet Take 10 mg by mouth daily.    [provider]  omega-3 acid ethyl esters (LOVAZA) 1 G capsule Take 2 g by mouth 2 (two) times daily.     [provider]  potassium chloride SA (K-DUR,KLOR-CON) 20 MEQ tablet Take 40 mEq by mouth See admin instructions. Take 2 tablets (40MEQ) by mouth twice daily for 3 days as directed 12/01/17   [provider]    Allergies  Allergen Reactions  . Catapres [Clonidine Hcl]   . Coreg [Carvedilol]   . Penicillins     Family History  Problem Relation Age of Onset  . Diabetes Mother     Social History Social History  Tobacco Use  . Smoking status: Former Research scientist (life sciences)  . Smokeless tobacco: Current User    Types: Chew  Substance Use Topics  . Alcohol use: No  . Drug use: No    Review of Systems mostly per son Constitutional: Negative for fever. ENT: Mild congestion Cardiovascular: Negative for chest pain. Respiratory: Negative for shortness of breath.  Occasional cough All other ROS negative  ____________________________________________   PHYSICAL EXAM:  VITAL SIGNS: ED Triage Vitals  Enc Vitals Group     BP 04/06/19 1938 (!) 158/72     Pulse Rate 04/06/19 1938 (!) 58     Resp 04/06/19 1938 20     Temp 04/06/19 1938 98.1 F (36.7 C)     Temp Source 04/06/19 1938 Oral     SpO2 04/06/19 1938 100 %     Weight --      Height 04/06/19 1939 5\' 8"  (1.727 m)     Head Circumference --      Peak Flow --      Pain Score 04/06/19 1939 0     Pain Loc --      Pain Edu? --      Excl. in [place]? --     Constitutional: Patient is awake and alert, lying in bed  comfortably, no acute distress. Eyes: Normal exam ENT      Head: Normocephalic and atraumatic. Cardiovascular: Normal rate, regular rhythm around 60 bpm. Respiratory: Normal respiratory effort without tachypnea nor retractions. Breath sounds are clear, without wheeze rales or rhonchi. Gastrointestinal: Soft and nontender. No distention.   Musculoskeletal: Nontender with normal range of motion in all extremities. No lower extremity tenderness or edema. Neurologic:  Normal speech and language. No gross focal neurologic deficits Skin:  Skin is warm, dry and intact.  Psychiatric: Mood and affect are normal.   ____________________________________________    EKG  EKG viewed and interpreted by myself shows what appears to be a sinus rhythm at 58 bpm with a narrow QRS, normal axis, slight PR prolongation otherwise normal intervals with nonspecific ST changes.  No ST elevation.  Right bundle branch block also present on 2019 EKG.  ____________________________________________    RADIOLOGY  Chest x-ray is negative.  ____________________________________________   INITIAL IMPRESSION / ASSESSMENT AND PLAN / ED COURSE  Pertinent labs & imaging results that were available during my care of the patient were reviewed by me and considered in my medical decision making (see chart for details).   Patient presents to the emergency department for generalized fatigue/weakness with a slight cough and congestion since his second Covid vaccine 3 days ago.  Overall the patient appears well, afebrile, reassuring vitals with slight hypertension normal pulse rate around 60 bpm.  Appears to be in a sinus rhythm at this time.  Patient's initial lab work is reassuring including a normal urinalysis.  Creatinine slightly elevated 1.3 over baseline around 1.0.  Patient is anemic however largely unchanged from recent lab work greater than 1 month ago per care everywhere.  We will check a digoxin level, we will gently  hydrate the patient.  We will obtain a rapid Covid swab as precaution.  Patient and son agreeable to plan of care.  Patient does have a mild creatinine elevation however the son states the patient has been recently fluid restricted and they might of restricted a little too much.  Does not wish for IV hydration in the emergency department.  Patient digoxin level is resulted at point 5 repeat troponin is unchanged.  Overall the patient appears quite well.  I did discuss with the son to slightly increase the patient's oral hydration at home.  Overall patient's work-up is very reassuring.  Derek Shelton was evaluated in Emergency Department on 04/06/2019 for the symptoms described in the history of present illness. He was evaluated in the context of the global COVID-19 pandemic, which necessitated consideration that the patient might be at risk for infection with the SARS-CoV-2 virus that causes COVID-19. Institutional protocols and algorithms that pertain to the evaluation of patients at risk for COVID-19 are in a state of rapid change based on information released by regulatory bodies including the CDC and federal and state organizations. These policies and algorithms were followed during the patient's care in the ED.  ____________________________________________   FINAL CLINICAL IMPRESSION(S) / ED DIAGNOSES  Weakness   Harvest Dark, MD 04/06/19 2222

## 2019-05-08 ENCOUNTER — Other Ambulatory Visit: Payer: Self-pay | Admitting: Pulmonary Disease

## 2019-05-08 DIAGNOSIS — I2724 Chronic thromboembolic pulmonary hypertension: Secondary | ICD-10-CM

## 2019-05-15 ENCOUNTER — Ambulatory Visit
Admission: RE | Admit: 2019-05-15 | Discharge: 2019-05-15 | Disposition: A | Discharge status: Home / Self Care | Payer: Medicare Other | Source: Ambulatory Visit | Attending: Pulmonary Disease | Admitting: Pulmonary Disease

## 2019-05-15 ENCOUNTER — Other Ambulatory Visit: Payer: Self-pay

## 2019-05-15 DIAGNOSIS — I2724 Chronic thromboembolic pulmonary hypertension: Secondary | ICD-10-CM

## 2019-05-15 MED ORDER — TECHNETIUM TO 99M ALBUMIN AGGREGATED
4.0000 | Freq: Once | INTRAVENOUS | Status: AC | PRN
  Administered 2019-05-15: 13:00:00 4.063 via INTRAVENOUS

## 2019-05-15 MED ORDER — TECHNETIUM TC 99M DIETHYLENETRIAME-PENTAACETIC ACID
30.0000 | Freq: Once | INTRAVENOUS | Status: AC | PRN
  Administered 2019-05-15: 32.399 via INTRAVENOUS

## 2019-07-06 ENCOUNTER — Inpatient Hospital Stay
Admission: EM | Admit: 2019-07-06 | Discharge: 2019-07-11 | DRG: 378 | Disposition: A | Discharge status: Home Health | Payer: Medicare Other | Attending: Internal Medicine | Admitting: Internal Medicine

## 2019-07-06 ENCOUNTER — Emergency Department: Payer: Medicare Other

## 2019-07-06 ENCOUNTER — Other Ambulatory Visit: Payer: Self-pay

## 2019-07-06 DIAGNOSIS — I110 Hypertensive heart disease with heart failure: Secondary | ICD-10-CM | POA: Diagnosis present

## 2019-07-06 DIAGNOSIS — F0390 Unspecified dementia without behavioral disturbance: Secondary | ICD-10-CM | POA: Diagnosis present

## 2019-07-06 DIAGNOSIS — K922 Gastrointestinal hemorrhage, unspecified: Secondary | ICD-10-CM | POA: Diagnosis not present

## 2019-07-06 DIAGNOSIS — Z833 Family history of diabetes mellitus: Secondary | ICD-10-CM | POA: Diagnosis not present

## 2019-07-06 DIAGNOSIS — R0602 Shortness of breath: Secondary | ICD-10-CM

## 2019-07-06 DIAGNOSIS — D62 Acute posthemorrhagic anemia: Secondary | ICD-10-CM | POA: Diagnosis present

## 2019-07-06 DIAGNOSIS — E785 Hyperlipidemia, unspecified: Secondary | ICD-10-CM | POA: Diagnosis present

## 2019-07-06 DIAGNOSIS — J449 Chronic obstructive pulmonary disease, unspecified: Secondary | ICD-10-CM | POA: Diagnosis present

## 2019-07-06 DIAGNOSIS — K641 Second degree hemorrhoids: Secondary | ICD-10-CM | POA: Diagnosis present

## 2019-07-06 DIAGNOSIS — K648 Other hemorrhoids: Secondary | ICD-10-CM | POA: Diagnosis present

## 2019-07-06 DIAGNOSIS — Z79890 Hormone replacement therapy (postmenopausal): Secondary | ICD-10-CM | POA: Diagnosis not present

## 2019-07-06 DIAGNOSIS — I482 Chronic atrial fibrillation, unspecified: Secondary | ICD-10-CM | POA: Diagnosis present

## 2019-07-06 DIAGNOSIS — D649 Anemia, unspecified: Secondary | ICD-10-CM | POA: Diagnosis present

## 2019-07-06 DIAGNOSIS — I509 Heart failure, unspecified: Secondary | ICD-10-CM | POA: Diagnosis present

## 2019-07-06 DIAGNOSIS — Z720 Tobacco use: Secondary | ICD-10-CM | POA: Diagnosis not present

## 2019-07-06 DIAGNOSIS — Z20822 Contact with and (suspected) exposure to covid-19: Secondary | ICD-10-CM | POA: Diagnosis present

## 2019-07-06 DIAGNOSIS — E039 Hypothyroidism, unspecified: Secondary | ICD-10-CM | POA: Diagnosis present

## 2019-07-06 DIAGNOSIS — I2510 Atherosclerotic heart disease of native coronary artery without angina pectoris: Secondary | ICD-10-CM | POA: Diagnosis present

## 2019-07-06 DIAGNOSIS — I480 Paroxysmal atrial fibrillation: Secondary | ICD-10-CM | POA: Diagnosis present

## 2019-07-06 DIAGNOSIS — Z7901 Long term (current) use of anticoagulants: Secondary | ICD-10-CM

## 2019-07-06 DIAGNOSIS — D500 Iron deficiency anemia secondary to blood loss (chronic): Secondary | ICD-10-CM | POA: Diagnosis not present

## 2019-07-06 DIAGNOSIS — N179 Acute kidney failure, unspecified: Secondary | ICD-10-CM | POA: Diagnosis present

## 2019-07-06 DIAGNOSIS — Z794 Long term (current) use of insulin: Secondary | ICD-10-CM | POA: Diagnosis not present

## 2019-07-06 DIAGNOSIS — R001 Bradycardia, unspecified: Secondary | ICD-10-CM | POA: Diagnosis not present

## 2019-07-06 DIAGNOSIS — E119 Type 2 diabetes mellitus without complications: Secondary | ICD-10-CM | POA: Diagnosis present

## 2019-07-06 DIAGNOSIS — K921 Melena: Secondary | ICD-10-CM | POA: Diagnosis present

## 2019-07-06 DIAGNOSIS — Z79899 Other long term (current) drug therapy: Secondary | ICD-10-CM | POA: Diagnosis not present

## 2019-07-06 DIAGNOSIS — I4891 Unspecified atrial fibrillation: Secondary | ICD-10-CM | POA: Diagnosis not present

## 2019-07-06 DIAGNOSIS — I272 Pulmonary hypertension, unspecified: Secondary | ICD-10-CM | POA: Diagnosis present

## 2019-07-06 HISTORY — DX: Cerebral infarction, unspecified: I63.9

## 2019-07-06 LAB — CBC
HCT: 20.9 % — ABNORMAL LOW (ref 39.0–52.0)
Hemoglobin: 6.2 g/dL — ABNORMAL LOW (ref 13.0–17.0)
MCH: 22 pg — ABNORMAL LOW (ref 26.0–34.0)
MCHC: 29.7 g/dL — ABNORMAL LOW (ref 30.0–36.0)
MCV: 74.1 fL — ABNORMAL LOW (ref 80.0–100.0)
Platelets: 206 10*3/uL (ref 150–400)
RBC: 2.82 MIL/uL — ABNORMAL LOW (ref 4.22–5.81)
RDW: 17.6 % — ABNORMAL HIGH (ref 11.5–15.5)
WBC: 8.1 10*3/uL (ref 4.0–10.5)
nRBC: 0.4 % — ABNORMAL HIGH (ref 0.0–0.2)

## 2019-07-06 LAB — IRON AND TIBC
Iron: 14 ug/dL — ABNORMAL LOW (ref 45–182)
Saturation Ratios: 3 % — ABNORMAL LOW (ref 17.9–39.5)
TIBC: 517 ug/dL — ABNORMAL HIGH (ref 250–450)
UIBC: 503 ug/dL

## 2019-07-06 LAB — TROPONIN I (HIGH SENSITIVITY)
Troponin I (High Sensitivity): 15 ng/L (ref <18)
Troponin I (High Sensitivity): 16 ng/L (ref <18)

## 2019-07-06 LAB — BASIC METABOLIC PANEL
Anion gap: 9 (ref 5–15)
BUN: 27 mg/dL — ABNORMAL HIGH (ref 8–23)
CO2: 27 mmol/L (ref 22–32)
Calcium: 8.6 mg/dL — ABNORMAL LOW (ref 8.9–10.3)
Chloride: 97 mmol/L — ABNORMAL LOW (ref 98–111)
Creatinine, Ser: 1.27 mg/dL — ABNORMAL HIGH (ref 0.61–1.24)
GFR calc Af Amer: >60 mL/min (ref >60)
GFR calc non Af Amer: 53 mL/min — ABNORMAL LOW (ref >60)
Glucose, Bld: 146 mg/dL — ABNORMAL HIGH (ref 70–99)
Potassium: 3.9 mmol/L (ref 3.5–5.1)
Sodium: 133 mmol/L — ABNORMAL LOW (ref 135–145)

## 2019-07-06 LAB — FOLATE: Folate: 8.2 ng/mL (ref >5.9)

## 2019-07-06 LAB — PREPARE RBC (CROSSMATCH)

## 2019-07-06 LAB — BRAIN NATRIURETIC PEPTIDE: B Natriuretic Peptide: 397 pg/mL — ABNORMAL HIGH (ref 0.0–100.0)

## 2019-07-06 LAB — GLUCOSE, CAPILLARY
Glucose-Capillary: 123 mg/dL — ABNORMAL HIGH (ref 70–99)
Glucose-Capillary: 153 mg/dL — ABNORMAL HIGH (ref 70–99)

## 2019-07-06 LAB — SARS CORONAVIRUS 2 BY RT PCR (HOSPITAL ORDER, PERFORMED IN ~~LOC~~ HOSPITAL LAB): SARS Coronavirus 2: NEGATIVE

## 2019-07-06 MED ORDER — SODIUM CHLORIDE 0.9 % IV SOLN: 10.0000 mL/h | Freq: Once | INTRAVENOUS | Status: DC

## 2019-07-06 MED ORDER — FLUTICASONE FUROATE-VILANTEROL 200-25 MCG/INH IN AEPB
1.0000 | INHALATION_SPRAY | Freq: Every day | RESPIRATORY_TRACT | Status: DC
  Administered 2019-07-07 – 2019-07-11 (×5): 1 via RESPIRATORY_TRACT
  Filled 2019-07-06 (×2): qty 28

## 2019-07-06 MED ORDER — ATORVASTATIN CALCIUM 20 MG PO TABS
40.0000 mg | ORAL_TABLET | Freq: Every day | ORAL | Status: DC
  Administered 2019-07-07 – 2019-07-10 (×3): 40 mg via ORAL
  Filled 2019-07-06 (×3): qty 2

## 2019-07-06 MED ORDER — DIGOXIN 125 MCG PO TABS
0.1250 mg | ORAL_TABLET | Freq: Every day | ORAL | Status: DC
  Administered 2019-07-07 – 2019-07-08 (×2): 0.125 mg via ORAL
  Filled 2019-07-06 (×3): qty 1

## 2019-07-06 MED ORDER — SODIUM CHLORIDE 0.9 % IV SOLN
8.0000 mg/h | INTRAVENOUS | Status: DC
  Administered 2019-07-06 – 2019-07-07 (×3): 8 mg/h via INTRAVENOUS
  Filled 2019-07-06 (×3): qty 80

## 2019-07-06 MED ORDER — IPRATROPIUM-ALBUTEROL 0.5-2.5 (3) MG/3ML IN SOLN
3.0000 mL | RESPIRATORY_TRACT | Status: DC | PRN
  Administered 2019-07-07 (×2): 3 mL via RESPIRATORY_TRACT
  Filled 2019-07-06 (×2): qty 3

## 2019-07-06 MED ORDER — SODIUM CHLORIDE 0.9 % IV SOLN
80.0000 mg | Freq: Once | INTRAVENOUS | Status: DC
  Filled 2019-07-06: qty 80

## 2019-07-06 MED ORDER — MELATONIN 5 MG PO TABS
5.0000 mg | ORAL_TABLET | Freq: Every evening | ORAL | Status: DC | PRN
  Filled 2019-07-06: qty 1

## 2019-07-06 MED ORDER — FINASTERIDE 5 MG PO TABS
5.0000 mg | ORAL_TABLET | Freq: Every day | ORAL | Status: DC
  Administered 2019-07-07 – 2019-07-11 (×5): 5 mg via ORAL
  Filled 2019-07-06 (×5): qty 1

## 2019-07-06 MED ORDER — DONEPEZIL HCL 5 MG PO TABS
5.0000 mg | ORAL_TABLET | Freq: Every day | ORAL | Status: DC
  Administered 2019-07-06 – 2019-07-08 (×3): 5 mg via ORAL
  Filled 2019-07-06 (×3): qty 1

## 2019-07-06 MED ORDER — DULOXETINE HCL 30 MG PO CPEP
60.0000 mg | ORAL_CAPSULE | Freq: Every day | ORAL | Status: DC
  Administered 2019-07-07 – 2019-07-11 (×5): 60 mg via ORAL
  Filled 2019-07-06 (×2): qty 1
  Filled 2019-07-06 (×2): qty 2
  Filled 2019-07-06: qty 1
  Filled 2019-07-06: qty 2

## 2019-07-06 MED ORDER — LEVOTHYROXINE SODIUM 112 MCG PO TABS
112.0000 ug | ORAL_TABLET | Freq: Every day | ORAL | Status: DC
  Administered 2019-07-07 – 2019-07-11 (×5): 112 ug via ORAL
  Filled 2019-07-06 (×6): qty 1

## 2019-07-06 MED ORDER — MONTELUKAST SODIUM 10 MG PO TABS
10.0000 mg | ORAL_TABLET | Freq: Every day | ORAL | Status: DC
  Administered 2019-07-07 – 2019-07-10 (×4): 10 mg via ORAL
  Filled 2019-07-06 (×4): qty 1

## 2019-07-06 NOTE — H&P (Signed)
History and Physical    Derek Shelton PZW:[id number] DOB: 1940/06/15 DOA: 07/06/2019  PCP: Perrin Maltese, MD  Patient coming from: home  I have personally briefly reviewed patient's old medical records in [place]  Chief Complaint: dyspnea  HPI: Derek Shelton is a 79 y.o. male with medical history significant of anemia, COPD, Afib on Eliquis, dementia, hypothyroidism who presented with worsening dyspnea.   Hx taken from son at bedside with wife on the phone.   Pt has been having worsening dyspnea.  His diuretic was held about 2 weeks which resulted in some mild swelling in his face and legs, but after diuretic was resumed about 5 days ago, the swelling resolved.  Dyspnea, however, continued to get worse.  Son reported pt has occasional wheezing and takes Symbicort daily and albuterol PRN at home.  Wife noted black stool for a while now.  No fever, chest pain, abdominal pain, N/V/D, dysuria.  At baseline, pt is sedentary.   Pt takes Eliquis for Afib, last dose this morning.  Pt does take PPI daily currently.  Of note, pt had Hgb drop to 6's back in July 2016.  EGD found "Diffuse mildly erythematous mucosa without bleeding" and colonoscopy found only internal hemorrhoids.  Colonoscopy  In 2018 found diverticula and polys.    ED Course: initial vitals: afebrile, pulse 63, BP 116/50, sating 99% on room air.  Labs notable for normal WBC, Hgb 6.2 (down from 11 in March 2021), MCV 74, BNP 397, trop neg x2, CXR no acute finding.  Pt was started on PPI gtt and ordered pRBC in the ED before admission.   Assessment/Plan Active Problems:   Symptomatic anemia  # Dyspnea likely due to symptomatic anemia --Hgb 6.2 (down from 11 in March 2021), on Eliquis, positive black stools.  No signs of volume overload. PLAN: --Transfuse 1 unit of pRBC and recheck H&H --goal Hgb >7 --anemia workup --GI consult (will need to wait for Eliquis washout before procedures)  # Afib on Eliquis --Hold  home Eliquis --Hold home Metop due to low BP --continue home digoxin  # COPD, stable --continue home Symbicort as Breo  # Dementia --continue home Aricept and Cymbalta  # Hypothyroidism --continue home Synthroid   DVT prophylaxis: SCD/Compression stockings Code Status: Full code  Family Communication: Son updated at bedside   Disposition Plan: home  Consults called: GI Admission status: Inpatient   Review of Systems: As per HPI otherwise 10 point review of systems negative.   Past Medical History:  Diagnosis Date  . Anemia   . Asthma   . CHF (congestive heart failure) ([place])   . COPD (chronic obstructive pulmonary disease) ([place])   . Coronary artery disease   . Dementia ([place])    Per son's report  . Diabetes mellitus without complication ([place])   . Dysrhythmia    Atrial Fibrillation  . GERD (gastroesophageal reflux disease)   . Hyperlipidemia   . Hypertension   . Hypothyroidism   . Shortness of breath dyspnea   . Sleep apnea   . Thyroid disease     Past Surgical History:  Procedure Laterality Date  . CARDIAC CATHETERIZATION    . COLONOSCOPY N/A 08/10/2014   Procedure: COLONOSCOPY;  Surgeon: Manya Silvas, MD;  Location: [facility] ENDOSCOPY;  Service: Endoscopy;  Laterality: N/A;  . COLONOSCOPY WITH PROPOFOL N/A 04/05/2016   Procedure: COLONOSCOPY WITH PROPOFOL;  Surgeon: Lollie Sails, MD;  Location: [facility] ENDOSCOPY;  Service: Endoscopy;  Laterality: N/A;  .  ESOPHAGOGASTRODUODENOSCOPY N/A 08/08/2014   Procedure: ESOPHAGOGASTRODUODENOSCOPY (EGD);  Surgeon: Manya Silvas, MD;  Location: [facility] ENDOSCOPY;  Service: Endoscopy;  Laterality: N/A;  plan for early afternoon  . EYE SURGERY    . GIVENS CAPSULE STUDY N/A 08/12/2014   Procedure: GIVENS CAPSULE STUDY;  Surgeon: Manya Silvas, MD;  Location: [facility] ENDOSCOPY;  Service: Endoscopy;  Laterality: N/A;  . rectal fistula N/A      reports that he has quit smoking. His smokeless tobacco use includes chew. He reports  that he does not drink alcohol and does not use drugs.  Allergies  Allergen Reactions  . Catapres [Clonidine Hcl]   . Coreg [Carvedilol]   . Penicillins     Family History  Problem Relation Age of Onset  . Diabetes Mother      Prior to Admission medications   Medication Sig Start Date End Date Taking? Authorizing Provider  apixaban (ELIQUIS) 5 MG TABS tablet Take 1 tablet (5 mg total) by mouth 2 (two) times daily. 05/23/17   [name], MD  atorvastatin (LIPITOR) 40 MG tablet Take 1 tablet (40 mg total) by mouth daily at 6 PM. 12/03/17   Demetrios Loll, MD  benzonatate (TESSALON) 100 MG capsule Take 100 mg by mouth 3 (three) times daily as needed for cough.    [provider]  budesonide-formoterol (SYMBICORT) 160-4.5 MCG/ACT inhaler Inhale 2 puffs into the lungs 2 (two) times daily.    [provider]  digoxin (LANOXIN) 0.125 MG tablet Take 1 tablet (0.125 mg total) by mouth daily. 12/08/17   Hillary Bow, MD  donepezil (ARICEPT) 5 MG tablet Take 5 mg by mouth at bedtime.    [provider]  DULoxetine (CYMBALTA) 60 MG capsule Take 60 mg by mouth daily.    [provider]  esomeprazole (NEXIUM) 40 MG capsule Take 40 mg by mouth daily at 12 noon.    [provider]  ferrous sulfate 325 (65 FE) MG EC tablet Take 325 mg by mouth daily.    [provider]  finasteride (PROSCAR) 5 MG tablet Take 1 tablet (5 mg total) by mouth daily. 07/18/18   Stoioff, Ronda Fairly, MD  furosemide (LASIX) 20 MG tablet Take 20 mg by mouth 2 (two) times daily.    [provider]  glimepiride (AMARYL) 2 MG tablet Take 3 mg by mouth 2 (two) times daily.     [provider]  guaiFENesin-dextromethorphan (ROBITUSSIN DM) 100-10 MG/5ML syrup Take 5 mLs by mouth every 4 (four) hours as needed for cough. 05/23/17   [name], MD  insulin degludec (TRESIBA FLEXTOUCH) 100 UNIT/ML SOPN FlexTouch Pen Inject 15 Units into the skin at bedtime.    [provider]  ipratropium-albuterol (DUONEB) 0.5-2.5 (3) MG/3ML SOLN Inhale 3 mLs into the lungs every 6 (six) hours as needed for shortness of breath. 01/22/17   [provider]  levothyroxine (SYNTHROID, LEVOTHROID) 112 MCG tablet Take 112 mcg by mouth daily before breakfast.     [provider]  Melatonin 3 MG TABS Take 3 mg by mouth at bedtime as needed (sleep).     [provider]  metoprolol tartrate (LOPRESSOR) 100 MG tablet Take 1 tablet (100 mg total) by mouth 2 (two) times daily. 02/11/17   [name], MD  montelukast (SINGULAIR) 10 MG tablet Take 10 mg by mouth daily.    [provider]  omega-3 acid ethyl esters (LOVAZA) 1 G capsule Take 2 g by mouth 2 (two) times  daily.     [provider]  potassium chloride SA (K-DUR,KLOR-CON) 20 MEQ tablet Take 40 mEq by mouth See admin instructions. Take 2 tablets (40MEQ) by mouth twice daily for 3 days as directed 12/01/17   [provider]    Physical Exam: Vitals:   07/06/19 1348 07/06/19 1349 07/06/19 1350 07/06/19 1351  BP:      Pulse: (!) 25 70  65  Resp: 19 (!) 30 (!) 23 18  Temp:      TempSrc:      SpO2: 100% 100%  100%  Weight:      Height:        Constitutional: NAD, alert, oriented HEENT: conjunctivae and lids normal, EOMI CV: RRR no M,R,G. Distal pulses +2.  No cyanosis.   RESP: CTA B/L, normal respiratory effort  GI: +BS, NTND Extremities: No effusions, edema, or tenderness in BLE SKIN: warm, dry and intact Neuro: II - XII grossly intact.  Sensation intact    Labs on Admission: I have personally reviewed following labs and imaging studies  CBC: Recent Labs  Lab 07/06/19 1142  WBC 8.1  HGB 6.2*  HCT 20.9*  MCV 74.1*  PLT 818   Basic Metabolic Panel: Recent Labs  Lab 07/06/19 1142  NA 133*  K 3.9  CL 97*  CO2 27  GLUCOSE 146*  BUN 27*  CREATININE 1.27*  CALCIUM 8.6*   GFR: Estimated Creatinine Clearance: 45.3 mL/min (A) (by C-G formula  based on SCr of 1.27 mg/dL (H)). Liver Function Tests: No results for input(s): AST, ALT, ALKPHOS, BILITOT, PROT, ALBUMIN in the last 168 hours. No results for input(s): LIPASE, AMYLASE in the last 168 hours. No results for input(s): AMMONIA in the last 168 hours. Coagulation Profile: No results for input(s): INR, PROTIME in the last 168 hours. Cardiac Enzymes: No results for input(s): CKTOTAL, CKMB, CKMBINDEX, TROPONINI in the last 168 hours. BNP (last 3 results) No results for input(s): PROBNP in the last 8760 hours. HbA1C: No results for input(s): HGBA1C in the last 72 hours. CBG: No results for input(s): GLUCAP in the last 168 hours. Lipid Profile: No results for input(s): CHOL, HDL, LDLCALC, TRIG, CHOLHDL, LDLDIRECT in the last 72 hours. Thyroid Function Tests: No results for input(s): TSH, T4TOTAL, FREET4, T3FREE, THYROIDAB in the last 72 hours. Anemia Panel: No results for input(s): VITAMINB12, FOLATE, FERRITIN, TIBC, IRON, RETICCTPCT in the last 72 hours. Urine analysis:    Component Value Date/Time   COLORURINE YELLOW (A) 04/06/2019 1954   APPEARANCEUR CLEAR (A) 04/06/2019 1954   APPEARANCEUR Clear 07/17/2018 1601   LABSPEC 1.012 04/06/2019 1954   LABSPEC 1.004 05/06/2014 2100   PHURINE 5.0 04/06/2019 [place] NEGATIVE 04/06/2019 1954   GLUCOSEU Negative 05/06/2014 2100   HGBUR NEGATIVE 04/06/2019 [place] NEGATIVE 04/06/2019 1954   BILIRUBINUR Negative 07/17/2018 1601   BILIRUBINUR Negative 05/06/2014 2100   KETONESUR NEGATIVE 04/06/2019 1954   PROTEINUR NEGATIVE 04/06/2019 1954   NITRITE NEGATIVE 04/06/2019 1954   LEUKOCYTESUR NEGATIVE 04/06/2019 1954   LEUKOCYTESUR Negative 05/06/2014 2100    Radiological Exams on Admission: DG Chest 2 View  Result Date: 07/06/2019 CLINICAL DATA:  Chest pain and shortness of breath. EXAM: CHEST - 2 VIEW COMPARISON:  Chest x-ray dated May 15, 2019. FINDINGS: Stable cardiomegaly. Normal pulmonary vascularity.  Mild right basilar atelectasis. No focal consolidation, pleural effusion, or pneumothorax. No acute osseous abnormality. IMPRESSION: No active cardiopulmonary disease. Electronically Signed   By: Orville Govern.D.  On: 07/06/2019 12:40      Enzo Bi MD [facility]  If 7PM-7AM, please contact night-coverage 07/06/2019, 3:21 PM

## 2019-07-06 NOTE — ED Provider Notes (Signed)
[facility] Emergency Department Provider Note  Time seen: 2:27 PM  I have reviewed the triage vital signs and the nursing notes.   HISTORY  Chief Complaint Shortness of Breath   HPI Derek Shelton is a 79 y.o. male with a past medical history of anemia, asthma, CHF, COPD, CAD, dementia, gastric reflux, hypertension, hyperlipidemia, presents to the emergency department for shortness of breath.  According to the patient and his son for the past several weeks the patient has been feeling short of breath which has been worse with exertion especially over this past 1 week.  States patient was taken off of his torsemide and Lasix approximately 2 weeks ago and was placed back on Lasix approximately 1 week ago.  Has noted slight increase in swelling.  Denies any fever.  Does state occasional cough.   Past Medical History:  Diagnosis Date  . Anemia   . Asthma   . CHF (congestive heart failure) ([place])   . COPD (chronic obstructive pulmonary disease) ([place])   . Coronary artery disease   . Dementia ([name])    Per son's report  . Diabetes mellitus without complication ([place])   . Dysrhythmia    Atrial Fibrillation  . GERD (gastroesophageal reflux disease)   . Hyperlipidemia   . Hypertension   . Hypothyroidism   . Shortness of breath dyspnea   . Sleep apnea   . Thyroid disease     Patient Active Problem List   Diagnosis Date Noted  . BPH (benign prostatic hyperplasia) 07/18/2018  . Degeneration of lumbar intervertebral disc 07/17/2018  . Osteoarthritis of knee 07/17/2018  . Spinal stenosis of lumbar region 07/17/2018  . Acute CVA (cerebrovascular accident) ([name]) 12/02/2017  . Acute ischemic stroke ([name]) 12/02/2017  . Acute metabolic encephalopathy due to hypoglycemia 11/29/2017  . Essential hypertension 11/29/2017  . Syncope and collapse 11/28/2017  . A-fib ([name]) 05/22/2017  . Chest pain 02/21/2017  . AKI (acute kidney injury) ([name]) 02/21/2017  . SOB (shortness  of breath) 02/08/2017  . Atrial fibrillation with RVR ([name]) 02/08/2017  . GI bleeding 08/06/2014  . Anemia 08/06/2014  . Bradycardia 08/06/2014  . Atrial fibrillation ([name]) 08/06/2014  . Hyponatremia 08/06/2014  . Chronic diastolic heart failure ([name]) 08/06/2014  . OSA (obstructive sleep apnea) 08/06/2014  . Memory loss or impairment 05/29/2014  . Type 2 diabetes mellitus, without long-term current use of insulin ([name]) 08/11/2010  . 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 Chronic rhinitis 08/11/2010  . Coronary artery disease 08/11/2010  . Esophageal reflux 08/11/2010  . Personal history of arthritis 08/11/2010  . Tear film insufficiency 06/07/2010  . 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 Keratoconjunctivitis sicca ([name]) 03/10/2010  . Lens replaced 03/10/2010  . Pterygium 03/10/2010    Past Surgical History:  Procedure Laterality Date  . CARDIAC CATHETERIZATION    . COLONOSCOPY N/A 08/10/2014   Procedure: COLONOSCOPY;  Surgeon: Manya Silvas, MD;  Location: [facility] ENDOSCOPY;  Service: Endoscopy;  Laterality: N/A;  . COLONOSCOPY WITH PROPOFOL N/A 04/05/2016   Procedure: COLONOSCOPY WITH PROPOFOL;  Surgeon: Lollie Sails, MD;  [facility]COPY;  Service: Endoscopy;  Laterality: N/A;  . ESOPHAGOGASTRODUODENOSCOPY N/A 08/08/2014   Procedure: ESOPHAGOGASTRODUODENOSCOPY (EGD);  Surgeon: Manya Silvas, MD;  Location: [facility] ENDOSCOPY;  Service: Endoscopy;  Laterality: N/A;  plan for early afternoon  . EYE SURGERY    . GIVENS CAPSULE STUDY N/A 08/12/2014   Procedure: GIVENS CAPSULE STUDY;  Surgeon: Manya Silvas, MD;  Location: [facility]  ENDOSCOPY;  Service: Endoscopy;  Laterality: N/A;  . rectal fistula N/A     Prior to Admission medications   Medication Sig Start Date End Date Taking? Authorizing Provider  apixaban (ELIQUIS) 5 MG TABS tablet Take 1 tablet (5 mg total) by mouth 2 (two) times daily. 05/23/17   [name], MD  atorvastatin (LIPITOR) 40 MG tablet Take 1  tablet (40 mg total) by mouth daily at 6 PM. 12/03/17   Demetrios Loll, MD  benzonatate (TESSALON) 100 MG capsule Take 100 mg by mouth 3 (three) times daily as needed for cough.    [provider]  budesonide-formoterol (SYMBICORT) 160-4.5 MCG/ACT inhaler Inhale 2 puffs into the lungs 2 (two) times daily.    [provider]  digoxin (LANOXIN) 0.125 MG tablet Take 1 tablet (0.125 mg total) by mouth daily. 12/08/17   Hillary Bow, MD  donepezil (ARICEPT) 5 MG tablet Take 5 mg by mouth at bedtime.    [provider]  DULoxetine (CYMBALTA) 60 MG capsule Take 60 mg by mouth daily.    [provider]  esomeprazole (NEXIUM) 40 MG capsule Take 40 mg by mouth daily at 12 noon.    [provider]  ferrous sulfate 325 (65 FE) MG EC tablet Take 325 mg by mouth daily.    [provider]  finasteride (PROSCAR) 5 MG tablet Take 1 tablet (5 mg total) by mouth daily. 07/18/18   Stoioff, Ronda Fairly, MD  furosemide (LASIX) 20 MG tablet Take 20 mg by mouth 2 (two) times daily.    [provider]  glimepiride (AMARYL) 2 MG tablet Take 3 mg by mouth 2 (two) times daily.     [provider]  guaiFENesin-dextromethorphan (ROBITUSSIN DM) 100-10 MG/5ML syrup Take 5 mLs by mouth every 4 (four) hours as needed for cough. 05/23/17   [name], MD  insulin degludec (TRESIBA FLEXTOUCH) 100 UNIT/ML SOPN FlexTouch Pen Inject 15 Units into the skin at bedtime.    [provider]  ipratropium-albuterol (DUONEB) 0.5-2.5 (3) MG/3ML SOLN Inhale 3 mLs into the lungs every 6 (six) hours as needed for shortness of breath. 01/22/17   [provider]  levothyroxine (SYNTHROID, LEVOTHROID) 112 MCG tablet Take 112 mcg by mouth daily before breakfast.     [provider]  Melatonin 3 MG TABS Take 3 mg by mouth at bedtime as needed (sleep).     [provider]  metoprolol tartrate (LOPRESSOR) 100 MG tablet Take 1 tablet (100 mg total) by mouth 2  (two) times daily. 02/11/17   [name], MD  montelukast (SINGULAIR) 10 MG tablet Take 10 mg by mouth daily.    [provider]  omega-3 acid ethyl esters (LOVAZA) 1 G capsule Take 2 g by mouth 2 (two) times daily.     [provider]  potassium chloride SA (K-DUR,KLOR-CON) 20 MEQ tablet Take 40 mEq by mouth See admin instructions. Take 2 tablets (40MEQ) by mouth twice daily for 3 days as directed 12/01/17   [provider]    Allergies  Allergen Reactions  . Catapres [Clonidine Hcl]   . Coreg [Carvedilol]   . Penicillins     Family History  Problem Relation Age of Onset  . Diabetes Mother     Social History Social History   Tobacco Use  . Smoking status: Former Research scientist (life sciences)  . Smokeless tobacco: Current User    Types: Chew  Vaping Use  . Vaping Use: Never used  Substance Use Topics  .  Alcohol use: No  . Drug use: No    Review of Systems Constitutional: Negative for fever. Cardiovascular: Negative for chest pain. Respiratory: Shortness of breath with exertion.  Worse with exertion.  Positive for occasional cough. Gastrointestinal: Negative for abdominal pain, vomiting  Musculoskeletal: Negative for musculoskeletal complaints Neurological: Negative for headache All other ROS negative  ____________________________________________   PHYSICAL EXAM:  VITAL SIGNS: ED Triage Vitals  Enc Vitals Group     BP 07/06/19 1138 (!) 116/50     Pulse Rate 07/06/19 1138 63     Resp 07/06/19 1138 18     Temp 07/06/19 1138 99 F (37.2 C)     Temp Source 07/06/19 1138 Oral     SpO2 07/06/19 1138 99 %     Weight 07/06/19 1139 192 lb (87.1 kg)     Height 07/06/19 1139 5\' 3"  (1.6 m)     Head Circumference --      Peak Flow --      Pain Score 07/06/19 1139 0     Pain Loc --      Pain Edu? --      Excl. in [place]? --     Constitutional: Alert and oriented. Well appearing and in no distress. Eyes: Normal exam ENT      Head: Normocephalic and  atraumatic.      Mouth/Throat: Mucous membranes are moist. Cardiovascular: Normal rate, regular rhythm.  Respiratory: Normal respiratory effort without tachypnea nor retractions. Breath sounds are clear Gastrointestinal: Soft and nontender. No distention. Musculoskeletal: Nontender with normal range of motion in all extremities. Neurologic:  Normal speech and language. No gross focal neurologic deficits Skin:  Skin is warm, dry and intact.  Psychiatric: Mood and affect are normal.   ____________________________________________    EKG  EKG viewed and interpreted by myself shows atrial fibrillation 97 bpm with a narrow QRS, normal axis, normal intervals, nonspecific ST changes.  ____________________________________________    RADIOLOGY  Chest x-ray is negative  ____________________________________________   INITIAL IMPRESSION / ASSESSMENT AND PLAN / ED COURSE  Pertinent labs & imaging results that were available during my care of the patient were reviewed by me and considered in my medical decision making (see chart for details).   Patient presents emergency department for shortness of breath worse over the past 1 week.  Currently patient appears well, does have mild edema but no significant edema.  Overall clear lung sounds with occasional cough during my examination.  X-ray is read as clear.  BNP is slightly elevated 397, troponin is negative.  Patient's hemoglobin however has resulted at 6.2 down from 11 3 months ago.  Rectal examination shows black stool strongly guaiac positive.  I discussed pros and cons of transfusion patient will sign transfusion consent.  Son states approximately 1.5 years ago patient had a GI bleed as well.  Patient does take Eliquis.  We will admit to the hospitalist service.  I have ordered 2 units of blood for the patient.  We will start on a Protonix bolus and infusion.  SADE MEHLHOFF was evaluated in Emergency Department on 07/06/2019 for the symptoms  described in the history of present illness. He was evaluated in the context of the global COVID-19 pandemic, which necessitated consideration that the patient might be at risk for infection with the SARS-CoV-2 virus that causes COVID-19. Institutional protocols and algorithms that pertain to the evaluation of patients at risk for COVID-19 are in a state of rapid change based on information released by  regulatory bodies including the CDC and federal and state organizations. These policies and algorithms were followed during the patient's care in the ED. CRITICAL CARE Performed by: Harvest Dark   Total critical care time: 30 minutes  Critical care time was exclusive of separately billable procedures and treating other patients.  Critical care was necessary to treat or prevent imminent or life-threatening deterioration.  Critical care was time spent personally by me on the following activities: development of treatment plan with patient and/or surrogate as well as nursing, discussions with consultants, evaluation of patient's response to treatment, examination of patient, obtaining history from patient or surrogate, ordering and performing treatments and interventions, ordering and review of laboratory studies, ordering and review of radiographic studies, pulse oximetry and re-evaluation of patient's condition.  ____________________________________________   FINAL CLINICAL IMPRESSION(S) / ED DIAGNOSES  GI bleed Dyspnea on exertion   Harvest Dark, MD 07/06/19 1430

## 2019-07-06 NOTE — ED Notes (Signed)
PCP has change to Dr. Gladstone Lighter at [facility].

## 2019-07-06 NOTE — ED Triage Notes (Signed)
Pt son with pt. Pt has dementia.   May 27th stopped taking lasix and toresemide per MD at [facility]. Had swelling in face, but not legs per son. Called MD and started lasix back on June 7th. Since then having SOB. Told son CP as well (heart palpitating). Hx a fib. Takes eliquis. Son states when pt walks around house he gets SOB.

## 2019-07-06 NOTE — Plan of Care (Signed)
  Problem: Clinical Measurements: Goal: Ability to maintain clinical measurements within normal limits will improve Outcome: Progressing Goal: Will remain free from infection Outcome: Progressing Goal: Diagnostic test results will improve Outcome: Progressing Goal: Respiratory complications will improve Outcome: Progressing   Problem: Activity: Goal: Risk for activity intolerance will decrease Outcome: Progressing   Problem: Nutrition: Goal: Adequate nutrition will be maintained Outcome: Progressing   Problem: Coping: Goal: Level of anxiety will decrease Outcome: Progressing   Problem: Elimination: Goal: Will not experience complications related to bowel motility Outcome: Progressing Goal: Will not experience complications related to urinary retention Outcome: Progressing   Problem: Pain Managment: Goal: General experience of comfort will improve Outcome: Progressing   Problem: Safety: Goal: Ability to remain free from injury will improve Outcome: Progressing   Problem: Skin Integrity: Goal: Risk for impaired skin integrity will decrease Outcome: Progressing   

## 2019-07-07 DIAGNOSIS — K921 Melena: Principal | ICD-10-CM

## 2019-07-07 LAB — CBC
HCT: 23.5 % — ABNORMAL LOW (ref 39.0–52.0)
Hemoglobin: 7.3 g/dL — ABNORMAL LOW (ref 13.0–17.0)
MCH: 23.5 pg — ABNORMAL LOW (ref 26.0–34.0)
MCHC: 31.1 g/dL (ref 30.0–36.0)
MCV: 75.6 fL — ABNORMAL LOW (ref 80.0–100.0)
Platelets: 199 10*3/uL (ref 150–400)
RBC: 3.11 MIL/uL — ABNORMAL LOW (ref 4.22–5.81)
RDW: 18.9 % — ABNORMAL HIGH (ref 11.5–15.5)
WBC: 7.6 10*3/uL (ref 4.0–10.5)
nRBC: 0.4 % — ABNORMAL HIGH (ref 0.0–0.2)

## 2019-07-07 LAB — BASIC METABOLIC PANEL
Anion gap: 8 (ref 5–15)
BUN: 28 mg/dL — ABNORMAL HIGH (ref 8–23)
CO2: 28 mmol/L (ref 22–32)
Calcium: 8.3 mg/dL — ABNORMAL LOW (ref 8.9–10.3)
Chloride: 100 mmol/L (ref 98–111)
Creatinine, Ser: 1.36 mg/dL — ABNORMAL HIGH (ref 0.61–1.24)
GFR calc Af Amer: 57 mL/min — ABNORMAL LOW (ref >60)
GFR calc non Af Amer: 49 mL/min — ABNORMAL LOW (ref >60)
Glucose, Bld: 131 mg/dL — ABNORMAL HIGH (ref 70–99)
Potassium: 4 mmol/L (ref 3.5–5.1)
Sodium: 136 mmol/L (ref 135–145)

## 2019-07-07 LAB — GLUCOSE, CAPILLARY
Glucose-Capillary: 127 mg/dL — ABNORMAL HIGH (ref 70–99)
Glucose-Capillary: 134 mg/dL — ABNORMAL HIGH (ref 70–99)
Glucose-Capillary: 175 mg/dL — ABNORMAL HIGH (ref 70–99)

## 2019-07-07 LAB — VITAMIN B12: Vitamin B-12: 512 pg/mL (ref 180–914)

## 2019-07-07 LAB — MAGNESIUM: Magnesium: 2.2 mg/dL (ref 1.7–2.4)

## 2019-07-07 MED ORDER — PANTOPRAZOLE SODIUM 40 MG IV SOLR
40.0000 mg | Freq: Two times a day (BID) | INTRAVENOUS | Status: DC
  Administered 2019-07-07 – 2019-07-11 (×8): 40 mg via INTRAVENOUS
  Filled 2019-07-07 (×8): qty 40

## 2019-07-07 MED ORDER — SODIUM CHLORIDE 0.9 % IV SOLN
200.0000 mg | Freq: Once | INTRAVENOUS | Status: AC
  Administered 2019-07-07: 200 mg via INTRAVENOUS
  Filled 2019-07-07: qty 10

## 2019-07-07 NOTE — Progress Notes (Signed)
Patient's son is requesting for breathing treatment, stated he takes it schedule at home. Dr. Damita Dunnings paged and received  an order for duoneb. Will administer and continue to monitor.

## 2019-07-07 NOTE — Plan of Care (Signed)
  Problem: Clinical Measurements: Goal: Ability to maintain clinical measurements within normal limits will improve Outcome: Progressing Goal: Diagnostic test results will improve Outcome: Progressing Goal: Respiratory complications will improve Outcome: Progressing   Problem: Activity: Goal: Risk for activity intolerance will decrease Outcome: Progressing   Problem: Coping: Goal: Level of anxiety will decrease Outcome: Progressing

## 2019-07-07 NOTE — Progress Notes (Signed)
PROGRESS NOTE    Derek Shelton  HKV:[id number] DOB: April 02, 1940 DOA: 07/06/2019 PCP: Gladstone Lighter, MD    Assessment & Plan:   Active Problems:   Symptomatic anemia    Derek Shelton is a 79 y.o. male with medical history significant of anemia, COPD, Afib on Eliquis, dementia, hypothyroidism who presented with worsening dyspnea.    # Dyspnea likely due to symptomatic anemia --Hgb 6.2 (down from 11 in March 2021), was on Eliquis, positive black stools.  No signs of volume overload. --Transfuse 1 unit of pRBC with appropriate rise --anemia workup showed severe iron def PLAN: --transfuse for goal Hgb >7 --IV iron 200 mg today --GI consult, planning on EGD and colonoscopy, "However, due to decreased creatinine clearance and Eliquis use, endoscopic procedures will need to be done in 3 to 4 days post last Eliquis use" --PPI IV twice daily  --Please page GI with any acute hemodynamic changes, or signs of active GI bleeding. Need for earlier procedures can be assessed if this occurs --Patient may need RBC scan if patient has active bleeding prior to the above timeline for endoscopic procedures  # Afib on Eliquis --Hold home Eliquis --Hold home Metop due to low BP --continue home digoxin  # COPD, stable --continue home Symbicort as Breo  # Dementia --continue home Aricept and Cymbalta  # Hypothyroidism --continue home Synthroid   DVT prophylaxis: SCD/Compression stockings Code Status: Full code  Family Communication: son updated at bedside today Status is: inpatient Dispo:   The patient is from: home Anticipated d/c is to: home Anticipated d/c date is: 2-3 days Patient currently is not medically stable to d/c due to: GI planning on EGD and colonoscopy after Eliquis washout.   Subjective and Interval History:  Pt has dementia, and sons answered all questions for him.  Appeared to have dyspnea, but denied pain.  No fever, N/V/D.    Objective: Vitals:    07/07/19 0402 07/07/19 0600 07/07/19 0753 07/07/19 1506  BP: 109/76  (!) 115/98 (!) 113/97  Pulse: (!) 50 99 100 (!) 101  Resp: 19 12 20 15   Temp: 98 F (36.7 C)  98 F (36.7 C) 98.4 F (36.9 C)  TempSrc: Oral  Oral Oral  SpO2: 99%  98% 100%  Weight:      Height:        Intake/Output Summary (Last 24 hours) at 07/07/2019 1905 Last data filed at 07/07/2019 1800 Gross per 24 hour  Intake 931.44 ml  Output 350 ml  Net 581.44 ml   Filed Weights   07/06/19 1139  Weight: 87.1 kg    Examination:   Constitutional: NAD, alert, oriented HEENT: conjunctivae and lids normal, EOMI CV: RRR no M,R,G. Distal pulses +2.  No cyanosis.   RESP: CTA B/L, normal respiratory effort  GI: +BS, NTND Extremities: No effusions, edema, or tenderness in BLE SKIN: warm, dry and intact Neuro: II - XII grossly intact.  Sensation intact   Data Reviewed: I have personally reviewed following labs and imaging studies  CBC: Recent Labs  Lab 07/06/19 1142 07/07/19 0608  WBC 8.1 7.6  HGB 6.2* 7.3*  HCT 20.9* 23.5*  MCV 74.1* 75.6*  PLT 206 564   Basic Metabolic Panel: Recent Labs  Lab 07/06/19 1142 07/07/19 0608  NA 133* 136  K 3.9 4.0  CL 97* 100  CO2 27 28  GLUCOSE 146* 131*  BUN 27* 28*  CREATININE 1.27* 1.36*  CALCIUM 8.6* 8.3*  MG  --  2.2  GFR: Estimated Creatinine Clearance: 42.3 mL/min (A) (by C-G formula based on SCr of 1.36 mg/dL (H)). Liver Function Tests: No results for input(s): AST, ALT, ALKPHOS, BILITOT, PROT, ALBUMIN in the last 168 hours. No results for input(s): LIPASE, AMYLASE in the last 168 hours. No results for input(s): AMMONIA in the last 168 hours. Coagulation Profile: No results for input(s): INR, PROTIME in the last 168 hours. Cardiac Enzymes: No results for input(s): CKTOTAL, CKMB, CKMBINDEX, TROPONINI in the last 168 hours. BNP (last 3 results) No results for input(s): PROBNP in the last 8760 hours. HbA1C: No results for input(s): HGBA1C in the last  72 hours. CBG: Recent Labs  Lab 07/06/19 1744 07/06/19 2134 07/07/19 0758 07/07/19 1155 07/07/19 1553  GLUCAP 123* 153* 127* 134* 175*   Lipid Profile: No results for input(s): CHOL, HDL, LDLCALC, TRIG, CHOLHDL, LDLDIRECT in the last 72 hours. Thyroid Function Tests: No results for input(s): TSH, T4TOTAL, FREET4, T3FREE, THYROIDAB in the last 72 hours. Anemia Panel: Recent Labs    07/06/19 1142  VITAMINB12 512  FOLATE 8.2  TIBC 517*  IRON 14*   Sepsis Labs: No results for input(s): PROCALCITON, LATICACIDVEN in the last 168 hours.  Recent Results (from the past 240 hour(s))  SARS Coronavirus 2 by RT PCR (hospital order, performed in [facility] hospital lab) Nasopharyngeal Nasopharyngeal Swab     Status: None   Collection Time: 07/06/19  3:36 PM   Specimen: Nasopharyngeal Swab  Result Value Ref Range Status   SARS Coronavirus 2 NEGATIVE NEGATIVE Final    Comment: (NOTE) SARS-CoV-2 target nucleic acids are NOT DETECTED.  The SARS-CoV-2 RNA is generally detectable in upper and lower respiratory specimens during the acute phase of infection. The lowest concentration of SARS-CoV-2 viral copies this assay can detect is 250 copies / mL. A negative result does not preclude SARS-CoV-2 infection and should not be used as the sole basis for treatment or other patient management decisions.  A negative result may occur with improper specimen collection / handling, submission of specimen other than nasopharyngeal swab, presence of viral mutation(s) within the areas targeted by this assay, and inadequate number of viral copies (<250 copies / mL). A negative result must be combined with clinical observations, patient history, and epidemiological information.  Fact Sheet for Patients:   StrictlyIdeas.no  Fact Sheet for Healthcare Providers: BankingDealers.co.za  This test is not yet approved or  cleared by the [place] FDA  and has been authorized for detection and/or diagnosis of SARS-CoV-2 by FDA under an Emergency Use Authorization (EUA).  This EUA will remain in effect (meaning this test can be used) for the duration of the COVID-19 declaration under Section 564(b)(1) of the Act, 21 U.S.C. section 360bbb-3(b)(1), unless the authorization is terminated or revoked sooner.  Performed at [facility], [address], [place], [place] [address]       Radiology Studies: DG Chest 2 View  Result Date: 07/06/2019 CLINICAL DATA:  Chest pain and shortness of breath. EXAM: CHEST - 2 VIEW COMPARISON:  Chest x-ray dated May 15, 2019. FINDINGS: Stable cardiomegaly. Normal pulmonary vascularity. Mild right basilar atelectasis. No focal consolidation, pleural effusion, or pneumothorax. No acute osseous abnormality. IMPRESSION: No active cardiopulmonary disease. Electronically Signed   By: Titus Dubin M.D.   On: 07/06/2019 12:40     Scheduled Meds: . atorvastatin  40 mg Oral q1800  . digoxin  0.125 mg Oral Daily  . donepezil  5 mg Oral QHS  . DULoxetine  60  mg Oral Daily  . finasteride  5 mg Oral Daily  . fluticasone furoate-vilanterol  1 puff Inhalation Daily  . levothyroxine  112 mcg Oral Q0600  . montelukast  10 mg Oral QHS   Continuous Infusions: . sodium chloride    . pantoprozole (PROTONIX) infusion 8 mg/hr (07/07/19 1431)  . pantoprazole (PROTONIX) IVPB       LOS: 1 day     Enzo Bi, MD [facility] If 7PM-7AM, please contact night-coverage 07/07/2019, 7:05 PM

## 2019-07-07 NOTE — Consult Note (Addendum)
[name], MD [address] [place], [place], [place], [address] [address], [place], [place], [place], [address] Phone: [phone]  Fax: [phone]  Consultation  Referring Provider:     Dr. Billie Ruddy Primary Care Physician:  Gladstone Lighter, MD Reason for Consultation:   Dyspnea  Date of Admission:  07/06/2019 Date of Consultation:  07/07/2019         HPI:   Derek Shelton is a 79 y.o. male with history of A. fib on Eliquis, last use yesterday, dementia, accompanied by his son, admitted with dyspnea.  Son reports patient's wife always notices dark stool at home and therefore did not think this was abnormal.  However, they brought him to the hospital due to dyspnea.  He also recently had alteration of his diuretics due to volume overload, but dyspnea continued to get worse since then.  Labs on admission showed hemoglobin of 6.2, with baseline being 14-15 in 2019, and 11 in March 2021.  Patient was previously seen by Dr. Vira Agar and Dr. Gustavo Lah due to anemia and melena in 2016, and Cologuard and heme positive stool in 2018 respectively.  2018 colonoscopy showed diverticulosis, 5 subcentimeter polyps that were removed.  2016 colonoscopy for iron deficiency anemia, during an inpatient admission, with internal hemorrhoids noted.  EGD at the time showed gastric erythema.  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 Anemia   . Asthma   . CHF (congestive heart failure) ([place])   . COPD (chronic obstructive pulmonary disease) ([place])   . Coronary artery disease   . Dementia ([place])    Per son's report  . Diabetes mellitus without complication ([place])   .  Dysrhythmia    Atrial Fibrillation  . GERD (gastroesophageal reflux disease)   . Hyperlipidemia   . Hypertension   . Hypothyroidism   . Shortness of breath dyspnea   . Sleep apnea   . Thyroid disease     Past Surgical History:  Procedure Laterality Date  . CARDIAC CATHETERIZATION    . COLONOSCOPY N/A 08/10/2014   Procedure: COLONOSCOPY;  Surgeon: Manya Silvas, MD;  Location: [facility] ENDOSCOPY;  Service: Endoscopy;  Laterality: N/A;  . COLONOSCOPY WITH PROPOFOL N/A 04/05/2016   Procedure: COLONOSCOPY WITH PROPOFOL;  Surgeon: Lollie Sails, MD;  Location: [facility] ENDOSCOPY;  Service: Endoscopy;  Laterality: N/A;  . ESOPHAGOGASTRODUODENOSCOPY N/A 08/08/2014   Procedure: ESOPHAGOGASTRODUODENOSCOPY (EGD);  Surgeon: Manya Silvas, MD;  Location[facility] ENDOSCOPY;  Service: Endoscopy;  Laterality: N/A;  plan for early afternoon  . EYE SURGERY    . GIVENS CAPSULE STUDY N/A 08/12/2014   Procedure: GIVENS CAPSULE STUDY;  Surgeon: Manya Silvas, MD;  Location: [facility] ENDOSCOPY;  Service: Endoscopy;  Laterality: N/A;  . rectal fistula N/A     Prior to Admission medications   Medication Sig Start Date End Date Taking? Authorizing Provider  apixaban (ELIQUIS) 5 MG TABS tablet Take 1 tablet (5 mg total) by mouth 2 (two) times daily. 05/23/17  Yes [name], MD  atorvastatin (LIPITOR) 40 MG tablet Take 1 tablet (40 mg total) by mouth daily at 6 PM. 12/03/17  Yes Demetrios Loll, MD  budesonide-formoterol [facility])  160-4.5 MCG/ACT inhaler Inhale 2 puffs into the lungs 2 (two) times daily.   Yes [provider]  digoxin (LANOXIN) 0.125 MG tablet Take 1 tablet (0.125 mg total) by mouth daily. Patient taking differently: Take 0.125 mg by mouth. TAKE ONCE A DAY EVERY OTHER DAY. 12/08/17  Yes Sudini, Alveta Heimlich, MD  donepezil (ARICEPT) 5 MG tablet Take 5 mg by mouth at bedtime.   Yes [provider]  DULoxetine (CYMBALTA) 60 MG capsule Take 60 mg by mouth daily.   Yes [provider]   finasteride (PROSCAR) 5 MG tablet Take 1 tablet (5 mg total) by mouth daily. 07/18/18  Yes Stoioff, Ronda Fairly, MD  furosemide (LASIX) 20 MG tablet Take 40 mg by mouth daily.    Yes [provider]  insulin degludec (TRESIBA FLEXTOUCH) 100 UNIT/ML SOPN FlexTouch Pen Inject 30 Units into the skin at bedtime.    Yes [provider]  ipratropium-albuterol (DUONEB) 0.5-2.5 (3) MG/3ML SOLN Inhale 3 mLs into the lungs every 6 (six) hours as needed for shortness of breath. 01/22/17  Yes [provider]  latanoprost (XALATAN) 0.005 % ophthalmic solution latanoprost 0.005 % eye drops  INSTILL ONE DROP TO BOTH EYES AT BEDTIME.   Yes [provider]  levothyroxine (SYNTHROID, LEVOTHROID) 112 MCG tablet Take 112 mcg by mouth daily before breakfast.    Yes [provider]  Melatonin 3 MG TABS Take 3 mg by mouth at bedtime as needed (sleep).    Yes [provider]  metoprolol tartrate (LOPRESSOR) 50 MG tablet Take 50 mg by mouth 2 (two) times daily.    Yes [provider]  montelukast (SINGULAIR) 10 MG tablet Take 10 mg by mouth daily.   Yes [provider]  omega-3 acid ethyl esters (LOVAZA) 1 G capsule Take 2 g by mouth 2 (two) times daily.    Yes [provider]  pantoprazole (PROTONIX) 40 MG tablet Take 40 mg by mouth daily. 06/04/19  Yes [provider]  potassium chloride SA (K-DUR,KLOR-CON) 20 MEQ tablet Take 10 mEq by mouth 3 (three) times a week. Take 1/2 tablets (10MEQ) by mouth twice daily for 3 days as directed Mon., Wed., Fri. 12/01/17  Yes [provider]  sildenafil (REVATIO) 20 MG tablet Take 20 mg by mouth 3 (three) times daily. 05/09/19  Yes [provider]  tamsulosin (FLOMAX) 0.4 MG CAPS capsule Take 0.4 mg by mouth daily. 05/07/19  Yes [provider]    Family History  Problem Relation Age of Onset  . Diabetes Mother      Social History   Tobacco Use  . Smoking status:  Former Research scientist (life sciences)  . Smokeless tobacco: Current User    Types: Chew  Vaping Use  . Vaping Use: Never used  Substance Use Topics  . Alcohol use: No  . Drug use: No    Allergies as of 07/06/2019 - Review Complete 07/06/2019  Allergen Reaction Noted  . Catapres [clonidine hcl]  04/04/2016  . Coreg [carvedilol]  04/04/2016  . Penicillins  08/08/2014    Review of Systems:    All systems reviewed and negative except where noted in HPI.   Physical Exam:  Vital signs in last 24 hours: Vitals:   07/07/19 0106 07/07/19 0402 07/07/19 0600 07/07/19 0753  BP: 101/74 109/76  (!) 115/98  Pulse: (!) 54 (!) 50 99 100  Resp: 16 19 12 20   Temp: 97.9 F (36.6 C) 98 F (36.7 C)  98 F (36.7 C)  TempSrc: Oral Oral  Oral  SpO2: 99% 99%  98%  Weight:      Height:       Last BM Date: 07/05/19 General:   Pleasant, cooperative in NAD Head:  Normocephalic and atraumatic. Eyes:   No icterus.   Conjunctiva pink. PERRLA. Ears:  Normal auditory acuity. Neck:  Supple; no masses or thyroidomegaly Lungs: Respirations even and unlabored. Lungs clear to auscultation bilaterally.   No wheezes, crackles, or rhonchi.  Abdomen:  Soft, nondistended, nontender. Normal bowel sounds. No appreciable masses or hepatomegaly.  No rebound or guarding.  Neurologic:  Alert and oriented x3;  grossly normal neurologically. Skin:  Intact without significant lesions or rashes. Cervical Nodes:  No significant cervical adenopathy. Psych:  Alert and cooperative. Normal affect.  LAB RESULTS: Recent Labs    07/06/19 1142 07/07/19 0608  WBC 8.1 7.6  HGB 6.2* 7.3*  HCT 20.9* 23.5*  PLT 206 199   BMET Recent Labs    07/06/19 1142 07/07/19 0608  NA 133* 136  K 3.9 4.0  CL 97* 100  CO2 27 28  GLUCOSE 146* 131*  BUN 27* 28*  CREATININE 1.27* 1.36*  CALCIUM 8.6* 8.3*   LFT No results for input(s): PROT, ALBUMIN, AST, ALT, ALKPHOS, BILITOT, BILIDIR, IBILI in the last 72 hours. PT/INR No results for input(s):  LABPROT, INR in the last 72 hours.  STUDIES: DG Chest 2 View  Result Date: 07/06/2019 CLINICAL DATA:  Chest pain and shortness of breath. EXAM: CHEST - 2 VIEW COMPARISON:  Chest x-ray dated May 15, 2019. FINDINGS: Stable cardiomegaly. Normal pulmonary vascularity. Mild right basilar atelectasis. No focal consolidation, pleural effusion, or pneumothorax. No acute osseous abnormality. IMPRESSION: No active cardiopulmonary disease. Electronically Signed   By: Titus Dubin M.D.   On: 07/06/2019 12:40      Impression / Plan:   NAIN RUDD is a 79 y.o. y/o male with history of A. fib on Eliquis, last use yesterday, presents with dyspnea, anemia and melena  EGD and colonoscopy indicated for further evaluation of anemia and melena.  Possible small bowel capsule study if EGD and colonoscopy are negative.  Patient also reportedly had an AVM on last pill camera study in 2016  Source of anemia is possibly small AVMs that may be anywhere in his GI tract given that one was found in 2016 on pill camera study.  Therefore, replacement with iron would help with anemia  However, due to decreased creatinine clearance and Eliquis use, endoscopic procedures will need to be done in 3 to 4 days post last Eliquis use  Hemoglobin has responded well to transfusion patient is otherwise hemodynamically stable.  Patient is eating solid food today  Due to low iron levels, would recommend IV iron replacement  PPI IV twice daily  Continue serial CBCs and transfuse PRN Avoid NSAIDs Maintain 2 large-bore IV lines Please page GI with any acute hemodynamic changes, or signs of active GI bleeding. Need for earlier procedures can be assessed if this occurs  Patient may need RBC scan if patient has active bleeding prior to the above timeline for endoscopic procedures    Thank you for involving me in the care of this patient.      LOS: 1 day   Virgel Manifold, MD  07/07/2019, 11:08 AM

## 2019-07-07 NOTE — Progress Notes (Signed)
Blood transfusion completed without any reaction. Will continue to monitor. 

## 2019-07-08 DIAGNOSIS — D649 Anemia, unspecified: Secondary | ICD-10-CM

## 2019-07-08 LAB — CBC
HCT: 23.1 % — ABNORMAL LOW (ref 39.0–52.0)
Hemoglobin: 7 g/dL — ABNORMAL LOW (ref 13.0–17.0)
MCH: 23 pg — ABNORMAL LOW (ref 26.0–34.0)
MCHC: 30.3 g/dL (ref 30.0–36.0)
MCV: 75.7 fL — ABNORMAL LOW (ref 80.0–100.0)
Platelets: 201 10*3/uL (ref 150–400)
RBC: 3.05 MIL/uL — ABNORMAL LOW (ref 4.22–5.81)
RDW: 19.6 % — ABNORMAL HIGH (ref 11.5–15.5)
WBC: 8.6 10*3/uL (ref 4.0–10.5)
nRBC: 0.3 % — ABNORMAL HIGH (ref 0.0–0.2)

## 2019-07-08 LAB — CBC WITH DIFFERENTIAL/PLATELET
Abs Immature Granulocytes: 0.27 10*3/uL — ABNORMAL HIGH (ref 0.00–0.07)
Basophils Absolute: 0.1 10*3/uL (ref 0.0–0.1)
Basophils Relative: 1 %
Eosinophils Absolute: 0.3 10*3/uL (ref 0.0–0.5)
Eosinophils Relative: 3 %
HCT: 27.6 % — ABNORMAL LOW (ref 39.0–52.0)
Hemoglobin: 8.5 g/dL — ABNORMAL LOW (ref 13.0–17.0)
Immature Granulocytes: 3 %
Lymphocytes Relative: 12 %
Lymphs Abs: 1.1 10*3/uL (ref 0.7–4.0)
MCH: 23.9 pg — ABNORMAL LOW (ref 26.0–34.0)
MCHC: 30.8 g/dL (ref 30.0–36.0)
MCV: 77.7 fL — ABNORMAL LOW (ref 80.0–100.0)
Monocytes Absolute: 0.8 10*3/uL (ref 0.1–1.0)
Monocytes Relative: 9 %
Neutro Abs: 6.6 10*3/uL (ref 1.7–7.7)
Neutrophils Relative %: 72 %
Platelets: 205 10*3/uL (ref 150–400)
RBC: 3.55 MIL/uL — ABNORMAL LOW (ref 4.22–5.81)
RDW: 19.8 % — ABNORMAL HIGH (ref 11.5–15.5)
WBC: 9 10*3/uL (ref 4.0–10.5)
nRBC: 0.4 % — ABNORMAL HIGH (ref 0.0–0.2)

## 2019-07-08 LAB — BASIC METABOLIC PANEL
Anion gap: 7 (ref 5–15)
BUN: 20 mg/dL (ref 8–23)
CO2: 28 mmol/L (ref 22–32)
Calcium: 8.2 mg/dL — ABNORMAL LOW (ref 8.9–10.3)
Chloride: 101 mmol/L (ref 98–111)
Creatinine, Ser: 1.05 mg/dL (ref 0.61–1.24)
GFR calc Af Amer: >60 mL/min (ref >60)
GFR calc non Af Amer: >60 mL/min (ref >60)
Glucose, Bld: 129 mg/dL — ABNORMAL HIGH (ref 70–99)
Potassium: 3.5 mmol/L (ref 3.5–5.1)
Sodium: 136 mmol/L (ref 135–145)

## 2019-07-08 LAB — MAGNESIUM: Magnesium: 2.3 mg/dL (ref 1.7–2.4)

## 2019-07-08 LAB — PREPARE RBC (CROSSMATCH)

## 2019-07-08 MED ORDER — LEVALBUTEROL HCL 0.63 MG/3ML IN NEBU
0.6300 mg | INHALATION_SOLUTION | Freq: Four times a day (QID) | RESPIRATORY_TRACT | Status: DC | PRN
  Administered 2019-07-08: 0.63 mg via RESPIRATORY_TRACT
  Filled 2019-07-08 (×4): qty 3

## 2019-07-08 MED ORDER — PEG 3350-KCL-NA BICARB-NACL 420 G PO SOLR
4000.0000 mL | Freq: Once | ORAL | Status: AC
  Administered 2019-07-08: 4000 mL via ORAL
  Filled 2019-07-08: qty 4000

## 2019-07-08 MED ORDER — METOPROLOL TARTRATE 50 MG PO TABS
50.0000 mg | ORAL_TABLET | Freq: Two times a day (BID) | ORAL | Status: DC
  Administered 2019-07-08: 50 mg via ORAL
  Filled 2019-07-08: qty 1

## 2019-07-08 MED ORDER — ACETAMINOPHEN 325 MG PO TABS
650.0000 mg | ORAL_TABLET | Freq: Four times a day (QID) | ORAL | Status: DC | PRN
  Administered 2019-07-09 – 2019-07-11 (×3): 650 mg via ORAL
  Filled 2019-07-08 (×4): qty 2

## 2019-07-08 MED ORDER — SODIUM CHLORIDE 0.9% IV SOLUTION: Freq: Once | INTRAVENOUS | Status: AC

## 2019-07-08 NOTE — TOC Initial Note (Signed)
Transition of Care [facility]) - Initial/Assessment Note    Patient Details  Name: Derek Shelton MRN: [id number] Date of Birth: 1940/03/19  [facility] CM/SW Contact:    Su Hilt, RN Phone Number: 07/08/2019, 2:18 PM  Clinical Narrative:                 Met with the patient and family in the room at the bedside, the patient's son stated that the patient lives at home with family and has 24/7 care if needed, he has DME at home including A Standard walker, a RW and a cane, he has a bathroom close to the bedroom and does not need a 3 in 1, The patient walks around with DME at home and does well.to have a EGD and Colonoscopy, will continue to monitor for needs  Expected Discharge Plan: Home/Self Care Barriers to Discharge: Continued Medical Work up   Patient Goals and CMS Choice Patient states their goals for this hospitalization and ongoing recovery are:: get better      Expected Discharge Plan and Services Expected Discharge Plan: Home/Self Care   Discharge Planning Services: CM Consult   Living arrangements for the past 2 months: Single Family Home                 DME Arranged: N/A         HH Arranged: NA          Prior Living Arrangements/Services Living arrangements for the past 2 months: Single Family Home Lives with:: Spouse, Adult Children Patient language and need for interpreter reviewed:: Yes (interpreter, Urdu, [place] needed) Do you feel safe going back to the place where you live?: Yes      Need for Family Participation in Patient Care: Yes (Comment)   Current home services: DME (rolling walker,  cane, standard walker) Criminal Activity/Legal Involvement Pertinent to Current Situation/Hospitalization: No - Comment as needed  Activities of Daily Living Home Assistive Devices/Equipment: Environmental consultant (specify type), Cane (specify quad or straight) ADL Screening (condition at time of admission) Patient's cognitive ability adequate to safely  complete daily activities?: No Is the patient deaf or have difficulty hearing?: No Does the patient have difficulty seeing, even when wearing glasses/contacts?: No Does the patient have difficulty concentrating, remembering, or making decisions?: Yes Patient able to express need for assistance with ADLs?: No Does the patient have difficulty dressing or bathing?: Yes Independently performs ADLs?: No Communication: Independent (speaks [name]) Dressing (OT): Needs assistance Is this a change from baseline?: Pre-admission baseline Grooming: Dependent Is this a change from baseline?: Pre-admission baseline Feeding: Needs assistance Is this a change from baseline?: Pre-admission baseline Bathing: Dependent Is this a change from baseline?: Pre-admission baseline Toileting: Needs assistance Is this a change from baseline?: Pre-admission baseline In/Out Bed: Needs assistance Is this a change from baseline?: Pre-admission baseline Walks in Home: Independent with device (comment) Does the patient have difficulty walking or climbing stairs?: Yes Weakness of Legs: Both Weakness of Arms/Hands: None  Permission Sought/Granted   Permission granted to share information with : Yes, Verbal Permission Granted              Emotional Assessment Appearance:: Appears stated age Attitude/Demeanor/Rapport: Engaged Affect (typically observed): Appropriate Orientation: : Oriented to  Time, Oriented to Situation, Oriented to Place, Oriented to Self Alcohol / Substance Use: Not Applicable Psych Involvement: No (comment)  Admission diagnosis:  Symptomatic anemia [D64.9] Patient Active Problem List   Diagnosis Date Noted  . Symptomatic anemia 07/06/2019  .  BPH (benign prostatic hyperplasia) 07/18/2018  . Degeneration of lumbar intervertebral disc 07/17/2018  . Osteoarthritis of knee 07/17/2018  . Spinal stenosis of lumbar region 07/17/2018  . Acute CVA (cerebrovascular accident) ([place]) 12/02/2017   . Acute ischemic stroke ([place]) 12/02/2017  . Acute metabolic encephalopathy due to hypoglycemia 11/29/2017  . Essential hypertension 11/29/2017  . Syncope and collapse 11/28/2017  . A-fib ([place]) 05/22/2017  . Chest pain 02/21/2017  . AKI (acute kidney injury) ([place]) 02/21/2017  . SOB (shortness of breath) 02/08/2017  . Atrial fibrillation with RVR ([place]) 02/08/2017  . GI bleeding 08/06/2014  . Anemia 08/06/2014  . Bradycardia 08/06/2014  . Atrial fibrillation ([place]) 08/06/2014  . Hyponatremia 08/06/2014  . Chronic diastolic heart failure ([place]) 08/06/2014  . OSA (obstructive sleep apnea) 08/06/2014  . Memory loss or impairment 05/29/2014  . Type 2 diabetes mellitus, without long-term current use of insulin ([place]) 08/11/2010  . 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 Chronic rhinitis 08/11/2010  . Coronary artery disease 08/11/2010  . Esophageal reflux 08/11/2010  . Personal history of arthritis 08/11/2010  . Tear film insufficiency 06/07/2010  . 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 Keratoconjunctivitis sicca ([place]) 03/10/2010  . Lens replaced 03/10/2010  . Pterygium 03/10/2010   PCP:  Gladstone Lighter, MD Pharmacy:   [id number] [id number]: [phone]: [phone]    Social Determinants of Health (SDOH) Interventions    Readmission Risk Interventions No flowsheet data found.

## 2019-07-08 NOTE — Progress Notes (Signed)
PROGRESS NOTE    Derek Shelton  WIO:[id number] DOB: [birth year] DOA: 07/06/2019 PCP: Gladstone Lighter, MD    Assessment & Plan:   Active Problems:   Symptomatic anemia    Derek Shelton is a 79 y.o. male with medical history significant of anemia, COPD, Afib on Eliquis, dementia, hypothyroidism who presented with worsening dyspnea.    # Dyspnea likely due to symptomatic anemia  2/2 GI bleed # Hx of GI bleed --Hgb 6.2 (down from 11 in March 2021), was on Eliquis, positive black stools.  No signs of volume overload. --Transfuse 1 unit of pRBC with appropriate rise --anemia workup showed severe iron def, s/p IV iron 200 mg x1 PLAN: --2nd unit of pRBC today for Hgb 7.0 --EGD and colonoscopy tomorrow --PPI IV twice daily  --Patient may need RBC scan if patient has active bleeding   # Afib on Eliquis --Hold home Eliquis --resume home Metop today now that BP has improved --continue home digoxin  # COPD, stable --continue home Symbicort as Breo  # Dementia --continue home Aricept and Cymbalta  # Hypothyroidism --continue home Synthroid   DVT prophylaxis: SCD/Compression stockings Code Status: Full code  Family Communication: wife updated at bedside today Status is: inpatient Dispo:   The patient is from: home Anticipated d/c is to: home Anticipated d/c date is: 1-2 days Patient currently is not medically stable to d/c due to: GI planning on EGD and colonoscopy tomorrow   Subjective and Interval History:  Pt has dementia, and can not answer ROS questions.  No fever, N/V.      Objective: Vitals:   07/08/19 1059 07/08/19 1100 07/08/19 1305 07/08/19 1514  BP: 131/79  (!) 147/84 (!) 144/87  Pulse: (!) 142 89 (!) 55 (!) 105  Resp: 19  17 16   Temp: 98 F (36.7 C)  97.9 F (36.6 C) 98.5 F (36.9 C)  TempSrc: Oral  Oral Oral  SpO2: 97%  99% 97%  Weight:      Height:        Intake/Output Summary (Last 24 hours) at 07/08/2019 1813 Last data filed at  07/08/2019 1304 Gross per 24 hour  Intake 808.67 ml  Output 600 ml  Net 208.67 ml   Filed Weights   07/06/19 1139  Weight: 87.1 kg    Examination:   Constitutional: NAD, alert HEENT: conjunctivae and lids normal, EOMI CV: RRR no M,R,G. Distal pulses +2.  No cyanosis.   RESP: CTA B/L, normal respiratory effort  GI: +BS, NTND Extremities: No effusions, edema, or tenderness in BLE SKIN: warm, dry and intact Neuro: II - XII grossly intact.  Sensation intact   Data Reviewed: I have personally reviewed following labs and imaging studies  CBC: Recent Labs  Lab 07/06/19 1142 07/07/19 0608 07/08/19 0638 07/08/19 1531  WBC 8.1 7.6 8.6 9.0  NEUTROABS  --   --   --  6.6  HGB 6.2* 7.3* 7.0* 8.5*  HCT 20.9* 23.5* 23.1* 27.6*  MCV 74.1* 75.6* 75.7* 77.7*  PLT 206 199 201 426   Basic Metabolic Panel: Recent Labs  Lab 07/06/19 1142 07/07/19 0608 07/08/19 0638  NA 133* 136 136  K 3.9 4.0 3.5  CL 97* 100 101  CO2 27 28 28   GLUCOSE 146* 131* 129*  BUN 27* 28* 20  CREATININE 1.27* 1.36* 1.05  CALCIUM 8.6* 8.3* 8.2*  MG  --  2.2 2.3   GFR: Estimated Creatinine Clearance: 54.8 mL/min (by C-G formula based on SCr of 1.05 mg/dL).  Liver Function Tests: No results for input(s): AST, ALT, ALKPHOS, BILITOT, PROT, ALBUMIN in the last 168 hours. No results for input(s): LIPASE, AMYLASE in the last 168 hours. No results for input(s): AMMONIA in the last 168 hours. Coagulation Profile: No results for input(s): INR, PROTIME in the last 168 hours. Cardiac Enzymes: No results for input(s): CKTOTAL, CKMB, CKMBINDEX, TROPONINI in the last 168 hours. BNP (last 3 results) No results for input(s): PROBNP in the last 8760 hours. HbA1C: No results for input(s): HGBA1C in the last 72 hours. CBG: Recent Labs  Lab 07/06/19 1744 07/06/19 2134 07/07/19 0758 07/07/19 1155 07/07/19 1553  GLUCAP 123* 153* 127* 134* 175*   Lipid Profile: No results for input(s): CHOL, HDL, LDLCALC, TRIG,  CHOLHDL, LDLDIRECT in the last 72 hours. Thyroid Function Tests: No results for input(s): TSH, T4TOTAL, FREET4, T3FREE, THYROIDAB in the last 72 hours. Anemia Panel: Recent Labs    07/06/19 1142  VITAMINB12 512  FOLATE 8.2  TIBC 517*  IRON 14*   Sepsis Labs: No results for input(s): PROCALCITON, LATICACIDVEN in the last 168 hours.  Recent Results (from the past 240 hour(s))  SARS Coronavirus 2 by RT PCR (hospital order, performed in [facility] hospital lab) Nasopharyngeal Nasopharyngeal Swab     Status: None   Collection Time: 07/06/19  3:36 PM   Specimen: Nasopharyngeal Swab  Result Value Ref Range Status   SARS Coronavirus 2 NEGATIVE NEGATIVE Final    Comment: (NOTE) SARS-CoV-2 target nucleic acids are NOT DETECTED.  The SARS-CoV-2 RNA is generally detectable in upper and lower respiratory specimens during the acute phase of infection. The lowest concentration of SARS-CoV-2 viral copies this assay can detect is 250 copies / mL. A negative result does not preclude SARS-CoV-2 infection and should not be used as the sole basis for treatment or other patient management decisions.  A negative result may occur with improper specimen collection / handling, submission of specimen other than nasopharyngeal swab, presence of viral mutation(s) within the areas targeted by this assay, and inadequate number of viral copies (<250 copies / mL). A negative result must be combined with clinical observations, patient history, and epidemiological information.  Fact Sheet for Patients:   StrictlyIdeas.no  Fact Sheet for Healthcare Providers: BankingDealers.co.za  This test is not yet approved or  cleared by the [place] FDA and has been authorized for detection and/or diagnosis of SARS-CoV-2 by FDA under an Emergency Use Authorization (EUA).  This EUA will remain in effect (meaning this test can be used) for the duration of the COVID-19  declaration under Section 564(b)(1) of the Act, 21 U.S.C. section 360bbb-3(b)(1), unless the authorization is terminated or revoked sooner.  Performed at [facility], [address] [place], [place] [address]       Radiology Studies: No results found.   Scheduled Meds: . atorvastatin  40 mg Oral q1800  . digoxin  0.125 mg Oral Daily  . donepezil  5 mg Oral QHS  . DULoxetine  60 mg Oral Daily  . finasteride  5 mg Oral Daily  . fluticasone furoate-vilanterol  1 puff Inhalation Daily  . levothyroxine  112 mcg Oral Q0600  . montelukast  10 mg Oral QHS  . pantoprazole (PROTONIX) IV  40 mg Intravenous Q12H   Continuous Infusions: . sodium chloride    . pantoprazole (PROTONIX) IVPB       LOS: 2 days     Enzo Bi, MD [facility] If 7PM-7AM, please contact night-coverage 07/08/2019, 6:13 PM

## 2019-07-08 NOTE — Progress Notes (Signed)
Lucilla Lame, MD [facility]   [address], [place], [place] [address] Phone: [phone] Fax : [phone]   Subjective: This patient has had his Eliquis held and had positive Hemoccult black stools.  The patient has had a work-up in the past including an EGD and colonoscopy and a capsule endoscopy.  The patient has had the findings of a small bowel AVM.  The patient is presently having a unit of blood placed.   Objective: Vital signs in last 24 hours: Vitals:   07/08/19 1029 07/08/19 1059 07/08/19 1100 07/08/19 1305  BP: 126/70 131/79  (!) 147/84  Pulse: (!) 145 (!) 142 89 (!) 55  Resp:  19  17  Temp: 98.6 F (37 C) 98 F (36.7 C)  97.9 F (36.6 C)  TempSrc: Oral Oral  Oral  SpO2: 98% 97%  99%  Weight:      Height:       Weight change:   Intake/Output Summary (Last 24 hours) at 07/08/2019 1348 Last data filed at 07/08/2019 1304 Gross per 24 hour  Intake 1252.11 ml  Output 800 ml  Net 452.11 ml     Exam: Heart:: Regular rate and rhythm, S1S2 present or without murmur or extra heart sounds Lungs: normal and clear to auscultation and percussion Abdomen: soft, nontender, normal bowel sounds   Lab Results: @LABTEST2 @ Micro Results: Recent Results (from the past 240 hour(s))  SARS Coronavirus 2 by RT PCR (hospital order, performed in [facility] hospital lab) Nasopharyngeal Nasopharyngeal Swab     Status: None   Collection Time: 07/06/19  3:36 PM   Specimen: Nasopharyngeal Swab  Result Value Ref Range Status   SARS Coronavirus 2 NEGATIVE NEGATIVE Final    Comment: (NOTE) SARS-CoV-2 target nucleic acids are NOT DETECTED.  The SARS-CoV-2 RNA is generally detectable in upper and lower respiratory specimens during the acute phase of infection. The lowest concentration of SARS-CoV-2 viral copies this assay can detect is 250 copies / mL. A negative result does not preclude SARS-CoV-2 infection and should not be used as the sole basis for treatment or  other patient management decisions.  A negative result may occur with improper specimen collection / handling, submission of specimen other than nasopharyngeal swab, presence of viral mutation(s) within the areas targeted by this assay, and inadequate number of viral copies (<250 copies / mL). A negative result must be combined with clinical observations, patient history, and epidemiological information.  Fact Sheet for Patients:   StrictlyIdeas.no  Fact Sheet for Healthcare Providers: BankingDealers.co.za  This test is not yet approved or  cleared by the [place] FDA and has been authorized for detection and/or diagnosis of SARS-CoV-2 by FDA under an Emergency Use Authorization (EUA).  This EUA will remain in effect (meaning this test can be used) for the duration of the COVID-19 declaration under Section 564(b)(1) of the Act, 21 U.S.C. section 360bbb-3(b)(1), unless the authorization is terminated or revoked sooner.  Performed at [facility], [address] [place], [place] [address]    Studies/Results: No results found. Medications: I have reviewed the patient's current medications. Scheduled Meds:  atorvastatin  40 mg Oral q1800   digoxin  0.125 mg Oral Daily   donepezil  5 mg Oral QHS   DULoxetine  60 mg Oral Daily   finasteride  5 mg Oral Daily   fluticasone furoate-vilanterol  1 puff Inhalation Daily   levothyroxine  112 mcg Oral Q0600   montelukast  10 mg Oral QHS  pantoprazole (PROTONIX) IV  40 mg Intravenous Q12H   Continuous Infusions:  sodium chloride     pantoprazole (PROTONIX) IVPB     PRN Meds:.ipratropium-albuterol, melatonin   Assessment: Active Problems:   Symptomatic anemia    Plan: The patient has a significant past medical history of anemia and was on Eliquis.  The patient has had a work-up in the past but was now admitted with a hemoglobin of 6.2.  I have discussed  the options with the patient's family including his son over the phone.  The patient has had the Eliquis held and we will plan for an EGD and colonoscopy for tomorrow.  The patient will receive a prep today.  The patient and the family have been explained the plan and agree with it.   LOS: 2 days   Lucilla Lame 07/08/2019, 1:48 PM Pager [phone] 7am-5pm  Check AMION for 5pm -7am coverage and on weekends

## 2019-07-09 ENCOUNTER — Encounter
Admission: EM | Disposition: A | Discharge status: Home Health | Payer: Self-pay | Source: Home / Self Care | Attending: Hospitalist

## 2019-07-09 DIAGNOSIS — R001 Bradycardia, unspecified: Secondary | ICD-10-CM

## 2019-07-09 DIAGNOSIS — I4891 Unspecified atrial fibrillation: Secondary | ICD-10-CM

## 2019-07-09 DIAGNOSIS — F0390 Unspecified dementia without behavioral disturbance: Secondary | ICD-10-CM

## 2019-07-09 LAB — TYPE AND SCREEN
ABO/RH(D): A POS
Antibody Screen: NEGATIVE
Unit division: 0
Unit division: 0

## 2019-07-09 LAB — BASIC METABOLIC PANEL
Anion gap: 10 (ref 5–15)
BUN: 16 mg/dL (ref 8–23)
CO2: 23 mmol/L (ref 22–32)
Calcium: 8.3 mg/dL — ABNORMAL LOW (ref 8.9–10.3)
Chloride: 101 mmol/L (ref 98–111)
Creatinine, Ser: 1.23 mg/dL (ref 0.61–1.24)
GFR calc Af Amer: >60 mL/min (ref >60)
GFR calc non Af Amer: 55 mL/min — ABNORMAL LOW (ref >60)
Glucose, Bld: 183 mg/dL — ABNORMAL HIGH (ref 70–99)
Potassium: 4.4 mmol/L (ref 3.5–5.1)
Sodium: 134 mmol/L — ABNORMAL LOW (ref 135–145)

## 2019-07-09 LAB — CBC
HCT: 30 % — ABNORMAL LOW (ref 39.0–52.0)
Hemoglobin: 9.6 g/dL — ABNORMAL LOW (ref 13.0–17.0)
MCH: 24.4 pg — ABNORMAL LOW (ref 26.0–34.0)
MCHC: 32 g/dL (ref 30.0–36.0)
MCV: 76.3 fL — ABNORMAL LOW (ref 80.0–100.0)
Platelets: 225 10*3/uL (ref 150–400)
RBC: 3.93 MIL/uL — ABNORMAL LOW (ref 4.22–5.81)
RDW: 20.1 % — ABNORMAL HIGH (ref 11.5–15.5)
WBC: 10.3 10*3/uL (ref 4.0–10.5)
nRBC: 1.2 % — ABNORMAL HIGH (ref 0.0–0.2)

## 2019-07-09 LAB — TROPONIN I (HIGH SENSITIVITY)
Troponin I (High Sensitivity): 19 ng/L — ABNORMAL HIGH (ref <18)
Troponin I (High Sensitivity): 19 ng/L — ABNORMAL HIGH (ref <18)

## 2019-07-09 LAB — BPAM RBC
Blood Product Expiration Date: 202107062359
Blood Product Expiration Date: 202107062359
ISSUE DATE / TIME: 202106130040
ISSUE DATE / TIME: 202106141039
Unit Type and Rh: 6200
Unit Type and Rh: 6200

## 2019-07-09 LAB — GLUCOSE, CAPILLARY: Glucose-Capillary: 184 mg/dL — ABNORMAL HIGH (ref 70–99)

## 2019-07-09 LAB — MAGNESIUM: Magnesium: 2.3 mg/dL (ref 1.7–2.4)

## 2019-07-09 LAB — MRSA PCR SCREENING: MRSA by PCR: NEGATIVE

## 2019-07-09 SURGERY — ESOPHAGOGASTRODUODENOSCOPY (EGD) WITH PROPOFOL
Anesthesia: General

## 2019-07-09 MED ORDER — SODIUM CHLORIDE 0.9 % IV BOLUS: 500.0000 mL | Freq: Once | INTRAVENOUS | Status: DC

## 2019-07-09 MED ORDER — ATROPINE SULFATE 1 MG/10ML IJ SOSY
PREFILLED_SYRINGE | INTRAMUSCULAR | Status: AC
  Filled 2019-07-09: qty 10

## 2019-07-09 MED ORDER — ATROPINE SULFATE 1 MG/10ML IJ SOSY: 0.5000 mg | PREFILLED_SYRINGE | Freq: Once | INTRAMUSCULAR | Status: DC

## 2019-07-09 MED ORDER — CHLORHEXIDINE GLUCONATE CLOTH 2 % EX PADS
6.0000 | MEDICATED_PAD | Freq: Every day | CUTANEOUS | Status: DC
  Administered 2019-07-11: 6 via TOPICAL

## 2019-07-09 NOTE — Progress Notes (Signed)
Pt's heart rate started dropping to the low 30's and sustaining; Rapid response was activated to prevent any further codes since his heart rate dropped tremendously from 92 to low 30's, NP was notified which she came to pt's bedside, pt was transferred to ICU for further monitoring. Continue to monitor.

## 2019-07-09 NOTE — Progress Notes (Signed)
Lucilla Lame, MD [facility]   [address], [place], [place] [address] Phone: [phone] Fax : [phone]   Subjective: The patient was admitted to the ICU for bradycardia.  I spoke to his wife and his son this morning about holding off on any procedures at this time due to the patient's bradycardia.  The patient did take a prep and was putting out liquid stools.   Objective: Vital signs in last 24 hours: Vitals:   07/09/19 0630 07/09/19 0800 07/09/19 0900 07/09/19 1000  BP: 110/79 136/72 (!) 112/92 123/89  Pulse:  67 (!) 59 (!) 58  Resp: 16 14 19 14   Temp:  97.9 F (36.6 C)    TempSrc:  Oral    SpO2: 94% 98% 99% 97%  Weight:      Height:       Weight change:   Intake/Output Summary (Last 24 hours) at 07/09/2019 1103 Last data filed at 07/09/2019 1000 Gross per 24 hour  Intake 516.67 ml  Output 400 ml  Net 116.67 ml     Exam: General: Patient nonverbal in no apparent distress   Lab Results: @LABTEST2 @ Micro Results: Recent Results (from the past 240 hour(s))  SARS Coronavirus 2 by RT PCR (hospital order, performed in [place] hospital lab) Nasopharyngeal Nasopharyngeal Swab     Status: None   Collection Time: 07/06/19  3:36 PM   Specimen: Nasopharyngeal Swab  Result Value Ref Range Status   SARS Coronavirus 2 NEGATIVE NEGATIVE Final    Comment: (NOTE) SARS-CoV-2 target nucleic acids are NOT DETECTED.  The SARS-CoV-2 RNA is generally detectable in upper and lower respiratory specimens during the acute phase of infection. The lowest concentration of SARS-CoV-2 viral copies this assay can detect is 250 copies / mL. A negative result does not preclude SARS-CoV-2 infection and should not be used as the sole basis for treatment or other patient management decisions.  A negative result may occur with improper specimen collection / handling, submission of specimen other than nasopharyngeal swab, presence of viral mutation(s) within the areas targeted by  this assay, and inadequate number of viral copies (<250 copies / mL). A negative result must be combined with clinical observations, patient history, and epidemiological information.  Fact Sheet for Patients:   StrictlyIdeas.no  Fact Sheet for Healthcare Providers: BankingDealers.co.za  This test is not yet approved or  cleared by the [place] FDA and has been authorized for detection and/or diagnosis of SARS-CoV-2 by FDA under an Emergency Use Authorization (EUA).  This EUA will remain in effect (meaning this test can be used) for the duration of the COVID-19 declaration under Section 564(b)(1) of the Act, 21 U.S.C. section 360bbb-3(b)(1), unless the authorization is terminated or revoked sooner.  Performed at [facility], [address] [place], [place] [address]   MRSA PCR Screening     Status: None   Collection Time: 07/09/19  1:39 AM   Specimen: Nasopharyngeal  Result Value Ref Range Status   MRSA by PCR NEGATIVE NEGATIVE Final    Comment:        The GeneXpert MRSA Assay (FDA approved for NASAL specimens only), is one component of a comprehensive MRSA colonization surveillance program. It is not intended to diagnose MRSA infection nor to guide or monitor treatment for MRSA infections. Performed at [facility], [address] [place], [place] [address]    Studies/Results: No results found. Medications: I have reviewed the patient's current medications. Scheduled Meds: . atorvastatin  40 mg  Oral q1800  . atropine  0.5 mg Intravenous Once  . atropine      . Chlorhexidine Gluconate Cloth  6 each Topical Daily  . DULoxetine  60 mg Oral Daily  . finasteride  5 mg Oral Daily  . fluticasone furoate-vilanterol  1 puff Inhalation Daily  . levothyroxine  112 mcg Oral Q0600  . montelukast  10 mg Oral QHS  . pantoprazole (PROTONIX) IV  40 mg Intravenous Q12H   Continuous Infusions: . sodium  chloride    . sodium chloride     PRN Meds:.acetaminophen, levalbuterol, melatonin   Assessment: Active Problems:   Symptomatic anemia    Plan: This patient has symptomatic anemia in need of a EGD and colonoscopy but had bradycardia yesterday.  The patient is undergoing a cardiac evaluation.  The patient was put on a clear liquid diet and if his cardiac status remains stable will have his EGD and colonoscopy tomorrow.  The patient's wife and his son have been explained the plan and agree with it.   LOS: 3 days   Lucilla Lame 07/09/2019, 11:03 AM Pager [phone] 7am-5pm  Check AMION for 5pm -7am coverage and on weekends

## 2019-07-09 NOTE — Consult Note (Signed)
Cardiology Consultation Note    Patient ID: Derek Shelton, MRN: [id number], DOB/AGE: 1940-02-12 79 y.o. Admit date: 07/06/2019   Date of Consult: 07/09/2019 Primary Physician: Gladstone Lighter, MD Primary Cardiologist:  Dr. Saralyn Pilar  Chief Complaint: dark stools Reason for Consultation: bradycardia Requesting MD: Dr. Lorella Nimrod  HPI: Derek Shelton is a 79 y.o. male with history of anemia, COPD, dementia, atrial fibrillation anticoagulated with Eliquis who was admitted after noting worsening dyspnea on 07/06/2019.  EKG at that time showed atrial fibrillation with variable ventricular response.  He developed bradycardia last p.m. in the 30s.  Apparently was asymptomatic at the time but awake.  He was hemodynamically stable by chart with pressures above 100.  This a.m. he has a heart rate in the 60s.  Rhythm is regular.  P waves are difficult to assess however appears to be sinus rhythm.  Twelve-lead is pending.  Patient was on metoprolol as well as digoxin as an outpatient.  These were both held yesterday.  He is a difficult historian due to his underlying memory disorder.  Currently has been treated with atorvastatin, Cymbalta 60 mg daily, finasteride, montelukast.  He is being evaluated by gastroenterology.  Serum troponins were drawn today which were unremarkable.  Electrolytes are unremarkable with potassium of 4.4.  Creatinine 1.23.  Magnesium is 2.3.  Was undergoing bowel prep last p.m. in preparation for colonoscopy.  Past Medical History:  Diagnosis Date  . Anemia   . Asthma   . CHF (congestive heart failure) ([place])   . COPD (chronic obstructive pulmonary disease) ([place])   . Coronary artery disease   . Dementia ([place])    Per son's report  . Diabetes mellitus without complication ([place])   . Dysrhythmia    Atrial Fibrillation  . GERD (gastroesophageal reflux disease)   . Hyperlipidemia   . Hypertension   . Hypothyroidism   . Shortness of breath dyspnea   . Sleep apnea   .  Thyroid disease       Surgical History:  Past Surgical History:  Procedure Laterality Date  . CARDIAC CATHETERIZATION    . COLONOSCOPY N/A 08/10/2014   Procedure: COLONOSCOPY;  Surgeon: Manya Silvas, MD;  Location: [facility] ENDOSCOPY;  Service: Endoscopy;  Laterality: N/A;  . COLONOSCOPY WITH PROPOFOL N/A 04/05/2016   Procedure: COLONOSCOPY WITH PROPOFOL;  Surgeon: Lollie Sails, MD;  Location: [facility] ENDOSCOPY;  Service: Endoscopy;  Laterality: N/A;  . ESOPHAGOGASTRODUODENOSCOPY N/A 08/08/2014   Procedure: ESOPHAGOGASTRODUODENOSCOPY (EGD);  Surgeon: Manya Silvas, MD;  Location: [facility] ENDOSCOPY;  Service: Endoscopy;  Laterality: N/A;  plan for early afternoon  . EYE SURGERY    . GIVENS CAPSULE STUDY N/A 08/12/2014   Procedure: GIVENS CAPSULE STUDY;  Surgeon: Manya Silvas, MD;  Location: [facility] ENDOSCOPY;  Service: Endoscopy;  Laterality: N/A;  . rectal fistula N/A      Home Meds: Prior to Admission medications   Medication Sig Start Date End Date Taking? Authorizing Provider  apixaban (ELIQUIS) 5 MG TABS tablet Take 1 tablet (5 mg total) by mouth 2 (two) times daily. 05/23/17  Yes [name], MD  atorvastatin (LIPITOR) 40 MG tablet Take 1 tablet (40 mg total) by mouth daily at 6 PM. 12/03/17  Yes Demetrios Loll, MD  budesonide-formoterol [facility]) 160-4.5 MCG/ACT inhaler Inhale 2 puffs into the lungs 2 (two) times daily.   Yes [provider]  digoxin (LANOXIN) 0.125 MG tablet Take 1 tablet (0.125 mg total) by mouth daily. Patient taking differently: Take 0.125  mg by mouth. TAKE ONCE A DAY EVERY OTHER DAY. 12/08/17  Yes Sudini, Alveta Heimlich, MD  donepezil (ARICEPT) 5 MG tablet Take 5 mg by mouth at bedtime.   Yes [provider]  DULoxetine (CYMBALTA) 60 MG capsule Take 60 mg by mouth daily.   Yes [provider]  finasteride (PROSCAR) 5 MG tablet Take 1 tablet (5 mg total) by mouth daily. 07/18/18  Yes Stoioff, Ronda Fairly, MD  furosemide (LASIX) 20 MG tablet Take 40  mg by mouth daily.    Yes [provider]  insulin degludec (TRESIBA FLEXTOUCH) 100 UNIT/ML SOPN FlexTouch Pen Inject 30 Units into the skin at bedtime.    Yes [provider]  ipratropium-albuterol (DUONEB) 0.5-2.5 (3) MG/3ML SOLN Inhale 3 mLs into the lungs every 6 (six) hours as needed for shortness of breath. 01/22/17  Yes [provider]  latanoprost (XALATAN) 0.005 % ophthalmic solution latanoprost 0.005 % eye drops  INSTILL ONE DROP TO BOTH EYES AT BEDTIME.   Yes [provider]  levothyroxine (SYNTHROID, LEVOTHROID) 112 MCG tablet Take 112 mcg by mouth daily before breakfast.    Yes [provider]  Melatonin 3 MG TABS Take 3 mg by mouth at bedtime as needed (sleep).    Yes [provider]  metoprolol tartrate (LOPRESSOR) 50 MG tablet Take 50 mg by mouth 2 (two) times daily.    Yes [provider]  montelukast (SINGULAIR) 10 MG tablet Take 10 mg by mouth daily.   Yes [provider]  omega-3 acid ethyl esters (LOVAZA) 1 G capsule Take 2 g by mouth 2 (two) times daily.    Yes [provider]  pantoprazole (PROTONIX) 40 MG tablet Take 40 mg by mouth daily. 06/04/19  Yes [provider]  potassium chloride SA (K-DUR,KLOR-CON) 20 MEQ tablet Take 10 mEq by mouth 3 (three) times a week. Take 1/2 tablets (10MEQ) by mouth twice daily for 3 days as directed Mon., Wed., Fri. 12/01/17  Yes [provider]  sildenafil (REVATIO) 20 MG tablet Take 20 mg by mouth 3 (three) times daily. 05/09/19  Yes [provider]  tamsulosin (FLOMAX) 0.4 MG CAPS capsule Take 0.4 mg by mouth daily. 05/07/19  Yes [provider]    Inpatient Medications:  . atorvastatin  40 mg Oral q1800  . atropine  0.5 mg Intravenous Once  . atropine      . Chlorhexidine Gluconate Cloth  6 each Topical Daily  . DULoxetine  60 mg Oral Daily  . finasteride  5 mg Oral Daily  . fluticasone furoate-vilanterol  1 puff  Inhalation Daily  . levothyroxine  112 mcg Oral Q0600  . montelukast  10 mg Oral QHS  . pantoprazole (PROTONIX) IV  40 mg Intravenous Q12H   . sodium chloride    . pantoprazole (PROTONIX) IVPB    . sodium chloride      Allergies:  Allergies  Allergen Reactions  . Catapres [Clonidine Hcl]   . Coreg [Carvedilol]   . Penicillins     Social History   Socioeconomic History  . Marital status: Married    Spouse name: Not on file  . Number of children: Not on file  . Years of education: Not on file  . Highest education level: Not on file  Occupational History  . Not on file  Tobacco Use  . Smoking status: Former Research scientist (life sciences)  . Smokeless tobacco: Current User    Types: Chew  Vaping Use  . Vaping Use: Never  used  Substance and Sexual Activity  . Alcohol use: No  . Drug use: No  . Sexual activity: Not on file  Other Topics Concern  . Not on file  Social History Narrative  . Not on file   Social Determinants of Health   Financial Resource Strain:   . Difficulty of Paying Living Expenses:   Food Insecurity:   . Worried About Charity fundraiser in the Last Year:   . Arboriculturist in the Last Year:   Transportation Needs:   . Film/video editor (Medical):   Marland Kitchen Lack of Transportation (Non-Medical):   Physical Activity:   . Days of Exercise per Week:   . Minutes of Exercise per Session:   Stress:   . Feeling of Stress :   Social Connections:   . Frequency of Communication with Friends and Family:   . Frequency of Social Gatherings with Friends and Family:   . Attends Religious Services:   . Active Member of Clubs or Organizations:   . Attends Archivist Meetings:   Marland Kitchen Marital Status:   Intimate Partner Violence:   . Fear of Current or Ex-Partner:   . Emotionally Abused:   Marland Kitchen Physically Abused:   . Sexually Abused:      Family History  Problem Relation Age of Onset  . Diabetes Mother      Review of Systems: A 12-system review of systems was  performed and is negative except as noted in the HPI.  Labs: No results for input(s): CKTOTAL, CKMB, TROPONINI in the last 72 hours. Lab Results  Component Value Date   WBC 10.3 07/09/2019   HGB 9.6 (L) 07/09/2019   HCT 30.0 (L) 07/09/2019   MCV 76.3 (L) 07/09/2019   PLT 225 07/09/2019    Recent Labs  Lab 07/09/19 0120  NA 134*  K 4.4  CL 101  CO2 23  BUN 16  CREATININE 1.23  CALCIUM 8.3*  GLUCOSE 183*   Lab Results  Component Value Date   CHOL 159 12/03/2017   HDL 32 (L) 12/03/2017   LDLCALC 110 (H) 12/03/2017   TRIG 84 12/03/2017   No results found for: DDIMER  Radiology/Studies:  DG Chest 2 View  Result Date: 07/06/2019 CLINICAL DATA:  Chest pain and shortness of breath. EXAM: CHEST - 2 VIEW COMPARISON:  Chest x-ray dated May 15, 2019. FINDINGS: Stable cardiomegaly. Normal pulmonary vascularity. Mild right basilar atelectasis. No focal consolidation, pleural effusion, or pneumothorax. No acute osseous abnormality. IMPRESSION: No active cardiopulmonary disease. Electronically Signed   By: Titus Dubin M.D.   On: 07/06/2019 12:40    Wt Readings from Last 3 Encounters:  07/06/19 87.1 kg  12/08/17 86.2 kg  09/15/17 88.5 kg    EKG: This a.m. appears to be sinus rhythm although repeat EKG is pending.  Rate is 62.  On admission atrial fibrillation with variable ventricular response.  Physical Exam:  Blood pressure 110/79, pulse (!) 35, temperature 98.4 F (36.9 C), resp. rate 16, height 5\' 3"  (1.6 m), weight 87.1 kg, SpO2 94 %. Body mass index is 34.01 kg/m. General: Well developed, well nourished, in no acute distress. Head: Normocephalic, atraumatic, sclera non-icteric, no xanthomas, nares are without discharge.  Neck: Negative for carotid bruits. JVD not elevated. Lungs: Clear bilaterally to auscultation without wheezes, rales, or rhonchi. Breathing is unlabored. Heart: RRR with S1 S2. No murmurs, rubs, or gallops appreciated. Abdomen: Soft, non-tender,  non-distended with normoactive bowel sounds. No hepatomegaly. No  rebound/guarding. No obvious abdominal masses. Msk:  Strength and tone appear normal for age. Extremities: No clubbing or cyanosis. No edema.  Distal pedal pulses are 2+ and equal bilaterally. Neuro: Alert and oriented X 3. No facial asymmetry. No focal deficit. Moves all extremities spontaneously. Psych:  Responds to questions appropriately with a normal affect.    Assessment and plan: 79 year old male with history of atrial fibrillation anticoagulated with Eliquis admitted with GI bleed.  Was on digoxin and metoprolol on admission.  Last p.m. developed A. fib with slow ventricular response.  Was hemodynamically stable with this.  Was getting bowel prep at the time.  Metoprolol and digoxin were held.  Currently appears to be in sinus rhythm although twelve-lead EKG will be ordered to confirm.  We continue to remain off of metoprolol and digoxin for now following heart rate.  Further GI work-up is pending.  Does not need to be n.p.o. from a cardiac standpoint.  No further cardiac procedures are scheduled today.  Signed, Teodoro Spray MD 07/09/2019, 8:13 AM Pager: [phone]

## 2019-07-09 NOTE — Progress Notes (Signed)
0135 Pt transferred to ICU for closer monitoring of non-symptomatic bradycardia (HR 40s). On arrival, HR 40s, stable BP and baseline mentation per wife. As night progressed, bradycardia worsening in 30s and some 20s(not sustained 20s) but continues to be non-symptomatic.   Pt drank most of NuLYTELY yesteraday 1300 in preparation for colonoscopy, but unlikely to proceed today d/t HR. If no procedure today pt may be able to do a clear liquid diet rather than NPO.

## 2019-07-09 NOTE — Progress Notes (Signed)
Attempted to assess pt this morning using the "interpreter on a stick" as pt speaks Urdu. Pt was unable to grasp the concept of the interpreter. The video for that language was unavailable, only audio. Pt was not verbally answering questions, only shaking or nodding his head in response to the interpreter. The interpreter attempted to explain that the pt needed to speak out loud and pt was still not able to grasp what he was needed to do. I have put in an interpreter request through the portal at this time and will wait and see if we have an Urdu speaking interpreter.

## 2019-07-09 NOTE — Progress Notes (Addendum)
    BRIEF OVERNIGHT PROGRESS REPORT   SUBJECTIVE: Rapid response called for significant bradycardia in the low 20 and 30's sustaining.  OBJECTIVE:On arrival to the bedside, he was afebrile with blood pressure 125/64 mm Hg and pulse rate 34 beats/min. There were no focal neurological deficits; he was alert and oriented x3 and answering questions appropriately. Wife at the bedside  ASSESSMENT/PLAN:  Bradycardia  Hx: Atrial fibrillation on Eliquis, metoprolol and Digoxin -Transfer to stepdown - STAT EKG obtained and showed Junctional Escape rhythm? - Repeat labs BMP. Mag. Troponin - Discussed with on call cardiology Dr. Ubaldo Glassing EKG finding, given patient is asymptomatic will continue to monitor in stepdown unit - Hold AV nodal drugs Metoprolol, Digoxin and Aricept - Atropine PRN - Consider Cardiology consult given persistent bradycardia with multiple pauses   Updated patient's son on patient's condition and transfer to stepdown unit.   Rufina Falco, DNP, CCRN, FNP-C [facility] Nurse Practitioner Between 7pm to 7am - Pager [phone]  After 7am go to [email or web address] - password:[email or web address]  [facility] [employer]  [phone]

## 2019-07-09 NOTE — Progress Notes (Signed)
PROGRESS NOTE    Derek Shelton  KZS:[id number] DOB: Jun 15, 1940 DOA: 07/06/2019 PCP: Gladstone Lighter, MD   Brief Narrative:  Derek Shelton a 79 y.o.malewith medical history significant ofanemia, COPD, Afib on Eliquis, dementia, hypothyroidism who presented with worsening dyspnea.  Found to have an hemoglobin of 6.2 secondary to GI bleed. Developed bradycardia last night resulted in postponement of EGD and colonoscopy.  Subjective: Patient was complaining of generalized weakness and bilateral leg pain.  Wife was present in the room.  According to her patient has underlying dementia and declining health for some time.  Patient is on Eliquis for A. Fib.  Did had an prior history of GI bleed.  They would like to continue Eliquis for stroke prevention.  Assessment & Plan:   Active Problems:   Symptomatic anemia  Symptomatic anemia secondary to GI bleed.  Likely the cause of exertional dyspnea.  Hemoglobin was 6.2 on admission, improved to 9.6 today, received 2 units PRBC. Patient took colon prep yesterday for EGD and colonoscopy today.  Developed bradycardia overnight resulted in postpone meant of EGD and colonoscopy. -Was placed on clear liquids by GI today. -EGD and colonoscopy tomorrow if remained stable. -Continue PPI IV twice daily. -Continue to monitor hemoglobin.  Bradycardia.  Patient developed bradycardia with heart rate in 30s overnight.  Remained asymptomatic.  Patient's home dose of metoprolol and digoxin was held.  He was given one-time dose of atropine.  Heart rate remained in the 60s since then.  Denies any chest pain. -Cardiology was consulted-no need for any further cardiac work-up. -Keep holding metoprolol and digoxin.  History of A. Fib.  -Home dose of Eliquis due to GI bleed. -Holding home dose of metoprolol and digoxin for bradycardia.  COPD, stable -continue home Symbicort as Breo   Dementia -continue home Aricept and Cymbalta    Hypothyroidism -continue home Synthroid  Objective: Vitals:   07/09/19 1500 07/09/19 1600 07/09/19 1700 07/09/19 1800  BP:  100/82 136/63 129/66  Pulse: (!) 58   (!) 36  Resp: 16 14 19 14   Temp:  98.2 F (36.8 C)    TempSrc:  Oral    SpO2: 96%   96%  Weight:      Height:        Intake/Output Summary (Last 24 hours) at 07/09/2019 1933 Last data filed at 07/09/2019 1000 Gross per 24 hour  Intake 0 ml  Output 200 ml  Net -200 ml   Filed Weights   07/06/19 1139  Weight: 87.1 kg    Examination:  General exam: Chronically ill-appearing gentleman, appears calm and comfortable  Respiratory system: Clear to auscultation. Respiratory effort normal. Cardiovascular system: S1 & S2 heard, RRR. No JVD, murmurs, rubs,  Gastrointestinal system: Soft, nontender, nondistended, bowel sounds positive. Central nervous system: Alert and oriented. No focal neurological deficits. Extremities: No edema, no cyanosis, pulses intact and symmetrical. Psychiatry: Judgement and insight appear normal.   DVT prophylaxis: SCDs Code Status: Full Family Communication: Wife was updated at bedside Disposition Plan:  Status is: Inpatient  Remains inpatient appropriate because:Inpatient level of care appropriate due to severity of illness   Dispo: The patient is from: Home              Anticipated d/c is to: Home              Anticipated d/c date is: 1 day              Patient currently is not medically stable to d/c.  Consultants:   GI  Cardiology  Procedures:  Antimicrobials:   Data Reviewed: I have personally reviewed following labs and imaging studies  CBC: Recent Labs  Lab 07/06/19 1142 07/07/19 0608 07/08/19 0638 07/08/19 1531 07/09/19 0120  WBC 8.1 7.6 8.6 9.0 10.3  NEUTROABS  --   --   --  6.6  --   HGB 6.2* 7.3* 7.0* 8.5* 9.6*  HCT 20.9* 23.5* 23.1* 27.6* 30.0*  MCV 74.1* 75.6* 75.7* 77.7* 76.3*  PLT 206 199 201 205 324   Basic Metabolic Panel: Recent Labs  Lab  07/06/19 1142 07/07/19 0608 07/08/19 0638 07/09/19 0120  NA 133* 136 136 134*  K 3.9 4.0 3.5 4.4  CL 97* 100 101 101  CO2 27 28 28 23   GLUCOSE 146* 131* 129* 183*  BUN 27* 28* 20 16  CREATININE 1.27* 1.36* 1.05 1.23  CALCIUM 8.6* 8.3* 8.2* 8.3*  MG  --  2.2 2.3 2.3   GFR: Estimated Creatinine Clearance: 46.7 mL/min (by C-G formula based on SCr of 1.23 mg/dL). Liver Function Tests: No results for input(s): AST, ALT, ALKPHOS, BILITOT, PROT, ALBUMIN in the last 168 hours. No results for input(s): LIPASE, AMYLASE in the last 168 hours. No results for input(s): AMMONIA in the last 168 hours. Coagulation Profile: No results for input(s): INR, PROTIME in the last 168 hours. Cardiac Enzymes: No results for input(s): CKTOTAL, CKMB, CKMBINDEX, TROPONINI in the last 168 hours. BNP (last 3 results) No results for input(s): PROBNP in the last 8760 hours. HbA1C: No results for input(s): HGBA1C in the last 72 hours. CBG: Recent Labs  Lab 07/06/19 2134 07/07/19 0758 07/07/19 1155 07/07/19 1553 07/09/19 0143  GLUCAP 153* 127* 134* 175* 184*   Lipid Profile: No results for input(s): CHOL, HDL, LDLCALC, TRIG, CHOLHDL, LDLDIRECT in the last 72 hours. Thyroid Function Tests: No results for input(s): TSH, T4TOTAL, FREET4, T3FREE, THYROIDAB in the last 72 hours. Anemia Panel: No results for input(s): VITAMINB12, FOLATE, FERRITIN, TIBC, IRON, RETICCTPCT in the last 72 hours. Sepsis Labs: No results for input(s): PROCALCITON, LATICACIDVEN in the last 168 hours.  Recent Results (from the past 240 hour(s))  SARS Coronavirus 2 by RT PCR (hospital order, performed in [facility] hospital lab) Nasopharyngeal Nasopharyngeal Swab     Status: None   Collection Time: 07/06/19  3:36 PM   Specimen: Nasopharyngeal Swab  Result Value Ref Range Status   SARS Coronavirus 2 NEGATIVE NEGATIVE Final    Comment: (NOTE) SARS-CoV-2 target nucleic acids are NOT DETECTED.  The SARS-CoV-2 RNA is generally  detectable in upper and lower respiratory specimens during the acute phase of infection. The lowest concentration of SARS-CoV-2 viral copies this assay can detect is 250 copies / mL. A negative result does not preclude SARS-CoV-2 infection and should not be used as the sole basis for treatment or other patient management decisions.  A negative result may occur with improper specimen collection / handling, submission of specimen other than nasopharyngeal swab, presence of viral mutation(s) within the areas targeted by this assay, and inadequate number of viral copies (<250 copies / mL). A negative result must be combined with clinical observations, patient history, and epidemiological information.  Fact Sheet for Patients:   StrictlyIdeas.no  Fact Sheet for Healthcare Providers: BankingDealers.co.za  This test is not yet approved or  cleared by the [place] FDA and has been authorized for detection and/or diagnosis of SARS-CoV-2 by FDA under an Emergency Use Authorization (EUA).  This EUA will remain in effect (  meaning this test can be used) for the duration of the COVID-19 declaration under Section 564(b)(1) of the Act, 21 U.S.C. section 360bbb-3(b)(1), unless the authorization is terminated or revoked sooner.  Performed at [facility], [address] [place], [place] [address]   MRSA PCR Screening     Status: None   Collection Time: 07/09/19  1:39 AM   Specimen: Nasopharyngeal  Result Value Ref Range Status   MRSA by PCR NEGATIVE NEGATIVE Final    Comment:        The GeneXpert MRSA Assay (FDA approved for NASAL specimens only), is one component of a comprehensive MRSA colonization surveillance program. It is not intended to diagnose MRSA infection nor to guide or monitor treatment for MRSA infections. Performed at [facility], [address], [place], [place] [address]      Radiology  Studies: No results found.  Scheduled Meds: . atorvastatin  40 mg Oral q1800  . atropine  0.5 mg Intravenous Once  . Chlorhexidine Gluconate Cloth  6 each Topical Daily  . DULoxetine  60 mg Oral Daily  . finasteride  5 mg Oral Daily  . fluticasone furoate-vilanterol  1 puff Inhalation Daily  . levothyroxine  112 mcg Oral Q0600  . montelukast  10 mg Oral QHS  . pantoprazole (PROTONIX) IV  40 mg Intravenous Q12H   Continuous Infusions: . sodium chloride    . sodium chloride       LOS: 3 days   Time spent: 45 minutes.  Lorella Nimrod, MD [facility]  If 7PM-7AM, please contact night-coverage [email or web address]  07/09/2019, 7:33 PM   This record has been created using Systems analyst. Errors have been sought and corrected,but may not always be located. Such creation errors do not reflect on the standard of care.

## 2019-07-10 ENCOUNTER — Encounter: Payer: Self-pay | Admitting: Hospitalist

## 2019-07-10 ENCOUNTER — Inpatient Hospital Stay: Payer: Medicare Other | Admitting: Certified Registered"

## 2019-07-10 ENCOUNTER — Encounter
Admission: EM | Disposition: A | Discharge status: Home Health | Payer: Self-pay | Source: Home / Self Care | Attending: Hospitalist

## 2019-07-10 ENCOUNTER — Other Ambulatory Visit: Payer: Self-pay

## 2019-07-10 DIAGNOSIS — D500 Iron deficiency anemia secondary to blood loss (chronic): Secondary | ICD-10-CM

## 2019-07-10 HISTORY — PX: ESOPHAGOGASTRODUODENOSCOPY (EGD) WITH PROPOFOL: SHX5813

## 2019-07-10 HISTORY — PX: COLONOSCOPY WITH PROPOFOL: SHX5780

## 2019-07-10 LAB — CBC
HCT: 27.2 % — ABNORMAL LOW (ref 39.0–52.0)
Hemoglobin: 8.6 g/dL — ABNORMAL LOW (ref 13.0–17.0)
MCH: 24.9 pg — ABNORMAL LOW (ref 26.0–34.0)
MCHC: 31.6 g/dL (ref 30.0–36.0)
MCV: 78.8 fL — ABNORMAL LOW (ref 80.0–100.0)
Platelets: 187 10*3/uL (ref 150–400)
RBC: 3.45 MIL/uL — ABNORMAL LOW (ref 4.22–5.81)
RDW: 21.2 % — ABNORMAL HIGH (ref 11.5–15.5)
WBC: 9.2 10*3/uL (ref 4.0–10.5)
nRBC: 0.3 % — ABNORMAL HIGH (ref 0.0–0.2)

## 2019-07-10 LAB — BASIC METABOLIC PANEL
Anion gap: 8 (ref 5–15)
Anion gap: 6 (ref 5–15)
BUN: 26 mg/dL — ABNORMAL HIGH (ref 8–23)
BUN: 26 mg/dL — ABNORMAL HIGH (ref 8–23)
CO2: 27 mmol/L (ref 22–32)
CO2: 27 mmol/L (ref 22–32)
Calcium: 8.6 mg/dL — ABNORMAL LOW (ref 8.9–10.3)
Calcium: 8.3 mg/dL — ABNORMAL LOW (ref 8.9–10.3)
Chloride: 100 mmol/L (ref 98–111)
Chloride: 99 mmol/L (ref 98–111)
Creatinine, Ser: 2.48 mg/dL — ABNORMAL HIGH (ref 0.61–1.24)
Creatinine, Ser: 2.22 mg/dL — ABNORMAL HIGH (ref 0.61–1.24)
GFR calc Af Amer: 27 mL/min — ABNORMAL LOW (ref >60)
GFR calc Af Amer: 31 mL/min — ABNORMAL LOW (ref >60)
GFR calc non Af Amer: 24 mL/min — ABNORMAL LOW (ref >60)
GFR calc non Af Amer: 27 mL/min — ABNORMAL LOW (ref >60)
Glucose, Bld: 136 mg/dL — ABNORMAL HIGH (ref 70–99)
Glucose, Bld: 137 mg/dL — ABNORMAL HIGH (ref 70–99)
Potassium: 4.2 mmol/L (ref 3.5–5.1)
Potassium: 4 mmol/L (ref 3.5–5.1)
Sodium: 135 mmol/L (ref 135–145)
Sodium: 132 mmol/L — ABNORMAL LOW (ref 135–145)

## 2019-07-10 LAB — GLUCOSE, CAPILLARY
Glucose-Capillary: 141 mg/dL — ABNORMAL HIGH (ref 70–99)
Glucose-Capillary: 142 mg/dL — ABNORMAL HIGH (ref 70–99)

## 2019-07-10 SURGERY — ESOPHAGOGASTRODUODENOSCOPY (EGD) WITH PROPOFOL
Anesthesia: General

## 2019-07-10 MED ORDER — PROPOFOL 500 MG/50ML IV EMUL
INTRAVENOUS | Status: DC | PRN
  Administered 2019-07-10: 100 ug/kg/min via INTRAVENOUS

## 2019-07-10 MED ORDER — PROPOFOL 10 MG/ML IV BOLUS
INTRAVENOUS | Status: DC | PRN
  Administered 2019-07-10: 50 mg via INTRAVENOUS

## 2019-07-10 MED ORDER — APIXABAN 2.5 MG PO TABS: 2.5000 mg | ORAL_TABLET | Freq: Two times a day (BID) | ORAL | 1 refills | Status: DC

## 2019-07-10 MED ORDER — TAMSULOSIN HCL 0.4 MG PO CAPS
0.4000 mg | ORAL_CAPSULE | Freq: Every day | ORAL | Status: DC
  Administered 2019-07-10 – 2019-07-11 (×2): 0.4 mg via ORAL
  Filled 2019-07-10 (×2): qty 1

## 2019-07-10 MED ORDER — SODIUM CHLORIDE 0.9 % IV SOLN: INTRAVENOUS | Status: DC

## 2019-07-10 MED ORDER — LIDOCAINE HCL (CARDIAC) PF 100 MG/5ML IV SOSY
PREFILLED_SYRINGE | INTRAVENOUS | Status: DC | PRN
  Administered 2019-07-10: 100 mg via INTRAVENOUS

## 2019-07-10 MED ORDER — GLYCOPYRROLATE PF 0.2 MG/ML IJ SOSY
PREFILLED_SYRINGE | INTRAMUSCULAR | Status: DC | PRN
  Administered 2019-07-10: .2 mg via INTRAVENOUS

## 2019-07-10 MED ORDER — INSULIN ASPART 100 UNIT/ML ~~LOC~~ SOLN
0.0000 [IU] | Freq: Three times a day (TID) | SUBCUTANEOUS | Status: DC
  Administered 2019-07-11: 1 [IU] via SUBCUTANEOUS
  Filled 2019-07-10: qty 1

## 2019-07-10 MED ORDER — PROPOFOL 10 MG/ML IV BOLUS
INTRAVENOUS | Status: AC
  Filled 2019-07-10: qty 20

## 2019-07-10 MED ORDER — INSULIN ASPART 100 UNIT/ML ~~LOC~~ SOLN: 0.0000 [IU] | Freq: Every day | SUBCUTANEOUS | Status: DC

## 2019-07-10 NOTE — Progress Notes (Signed)
Pt has not voided since I&O cathed earlier today.  Pt unable to void and does not feel urge to go.  Bladder scanned and showed 115cc in bladder.  Will cont to monitor.

## 2019-07-10 NOTE — Op Note (Signed)
[facility] Patient Name: Derek Shelton Procedure Date: 07/10/2019 12:14 PM MRN: [id number] Account #: [id number] Date of Birth: 02/04/1940 Admit Type: Inpatient Age: 79 Room: [facility] ENDO ROOM 1 Gender: Male Note Status: Finalized Procedure:             Colonoscopy Indications:           Iron deficiency anemia Providers:             Lucilla Lame MD, MD Referring MD:          Gladstone Lighter, MD (Referring MD) Medicines:             Propofol per Anesthesia Complications:         No immediate complications. Procedure:             Pre-Anesthesia Assessment:                        - Prior to the procedure, a History and Physical was                         performed, and patient medications and allergies were                         reviewed. The patient's tolerance of previous                         anesthesia was also reviewed. The risks and benefits                         of the procedure and the sedation options and risks                         were discussed with the patient. All questions were                         answered, and informed consent was obtained. Prior                         Anticoagulants: The patient has taken no previous                         anticoagulant or antiplatelet agents. ASA Grade                         Assessment: III - A patient with severe systemic                         disease. After reviewing the risks and benefits, the                         patient was deemed in satisfactory condition to                         undergo the procedure.                        After obtaining informed consent, the colonoscope was  passed under direct vision. Throughout the procedure,                         the patient's blood pressure, pulse, and oxygen                         saturations were monitored continuously. The                         Colonoscope was introduced through the anus and                          advanced to the the cecum, identified by appendiceal                         orifice and ileocecal valve. The colonoscopy was                         performed without difficulty. The patient tolerated                         the procedure well. The quality of the bowel                         preparation was fair. Findings:      The perianal and digital rectal examinations were normal.      Non-bleeding internal hemorrhoids were found during retroflexion. The       hemorrhoids were Grade II (internal hemorrhoids that prolapse but reduce       spontaneously).      Liquid stool was found in the entire colon, yellow in color without any       old or new blood seen. Impression:            - Preparation of the colon was fair.                        - Non-bleeding internal hemorrhoids.                        - Stool in the entire examined colon.                        - No specimens collected. Recommendation:        - Return patient to hospital ward for ongoing care.                        - Clear liquid diet.                        - Continue present medications.                        - To visualize the small bowel, perform video capsule                         endoscopy tomorrow. Procedure Code(s):     --- Professional ---                        [phone], Colonoscopy, flexible; diagnostic, including  collection of specimen(s) by brushing or washing, when                         performed (separate procedure) Diagnosis Code(s):     --- Professional ---                        D50.9, Iron deficiency anemia, unspecified CPT copyright 2019 American Medical Association. All rights reserved. The codes documented in this report are preliminary and upon coder review may  be revised to meet current compliance requirements. Lucilla Lame MD, MD 07/10/2019 12:48:09 PM This report has been signed electronically. Number of Addenda: 0 Note Initiated On: 07/10/2019  12:14 PM Scope Withdrawal Time: 0 hours 4 minutes 35 seconds  Total Procedure Duration: 0 hours 7 minutes 10 seconds  Estimated Blood Loss:  Estimated blood loss: none.      [facility]

## 2019-07-10 NOTE — Anesthesia Preprocedure Evaluation (Signed)
Anesthesia Evaluation  Patient identified by MRN, date of birth, ID band Patient awake    Reviewed: Allergy & Precautions, H&P , NPO status , reviewed documented beta blocker date and time   Airway Mallampati: III  TM Distance: <3 FB Neck ROM: limited    Dental  (+) Poor Dentition, Chipped, Missing   Pulmonary shortness of breath, asthma , sleep apnea , COPD,  COPD inhaler, former smoker,     + wheezing      Cardiovascular hypertension, + CAD and +CHF  Normal cardiovascular exam+ dysrhythmias      Neuro/Psych PSYCHIATRIC DISORDERS Dementia CVA    GI/Hepatic GERD  ,  Endo/Other  diabetesHypothyroidism   Renal/GU Renal disease     Musculoskeletal  (+) Arthritis ,   Abdominal   Peds  Hematology  (+) Blood dyscrasia, anemia ,   Anesthesia Other Findings Past Medical History: No date: Anemia No date: Asthma No date: CHF (congestive heart failure) (HCC) No date: COPD (chronic obstructive pulmonary disease) (HCC) No date: Coronary artery disease No date: Dementia (HCC)     Comment:  Per son's report No date: Diabetes mellitus without complication (HCC) No date: Dysrhythmia     Comment:  Atrial Fibrillation No date: GERD (gastroesophageal reflux disease) No date: Hyperlipidemia No date: Hypertension No date: Hypothyroidism No date: Shortness of breath dyspnea No date: Sleep apnea No date: Stroke [facility]) No date: Thyroid disease  Past Surgical History: No date: CARDIAC CATHETERIZATION 08/10/2014: COLONOSCOPY; N/A     Comment:  Procedure: COLONOSCOPY;  Surgeon: Manya Silvas, MD;               Location: [facility] ENDOSCOPY;  Service: Endoscopy;                Laterality: N/A; 04/05/2016: COLONOSCOPY WITH PROPOFOL; N/A     Comment:  Procedure: COLONOSCOPY WITH PROPOFOL;  Surgeon: Lollie Sails, MD;  Location: [facility] ENDOSCOPY;  Service:               Endoscopy;  Laterality: N/A; 08/08/2014:  ESOPHAGOGASTRODUODENOSCOPY; N/A     Comment:  Procedure: ESOPHAGOGASTRODUODENOSCOPY (EGD);  Surgeon:               Manya Silvas, MD;  Location: [facility] ENDOSCOPY;                Service: Endoscopy;  Laterality: N/A;  plan for early               afternoon No date: EYE SURGERY 08/12/2014: GIVENS CAPSULE STUDY; N/A     Comment:  Procedure: GIVENS CAPSULE STUDY;  Surgeon: Manya Silvas, MD;  Location: [place];  Service:               Endoscopy;  Laterality: N/A; 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; N/A  BMI    Body Mass Index: 30.47 kg/m      Reproductive/Obstetrics                             Anesthesia Physical Anesthesia Plan  ASA: IV  Anesthesia Plan: General   Post-op Pain Management:    Induction: Intravenous  PONV Risk Score and Plan: Treatment may vary due to age or medical condition and TIVA  Airway Management Planned: Nasal Cannula and Natural Airway  Additional Equipment:  Intra-op Plan:   Post-operative Plan:   Informed Consent: I have reviewed the patients History and Physical, chart, labs and discussed the procedure including the risks, benefits and alternatives for the proposed anesthesia with the patient or authorized representative who has indicated his/her understanding and acceptance.     Dental Advisory Given  Plan Discussed with: CRNA  Anesthesia Plan Comments:         Anesthesia Quick Evaluation

## 2019-07-10 NOTE — Progress Notes (Signed)
Pt received to 238 PCU 2A from ICU.  Son at bs.  Pt does not speak [place] and son here to translate, language is Urdu.  Oriented to room and call bell.  See assessment and vs's.  NSR at 60 on the monitor.

## 2019-07-10 NOTE — Care Management Important Message (Signed)
Important Message  Patient Details  Name: Derek Shelton MRN: [id number] Date of Birth: 02/23/1940   Medicare Important Message Given:  Yes     Dannette Barbara 07/10/2019, 11:16 AM

## 2019-07-10 NOTE — Discharge Summary (Addendum)
Physician Discharge Summary  Derek Shelton XBL:[id number] DOB: 1940-08-12 DOA: 07/06/2019  PCP: Derek Lighter, MD  Admit date: 07/06/2019 Discharge date: 07/11/2019  Admitted From: Home Disposition:  Home  Recommendations for Outpatient Follow-up:  1. Follow up with PCP in 1-2 weeks 2. Follow-up with gastroenterology. 3. Follow-up with cardiology 4. Please obtain BMP/CBC in one week 5. Please follow up on the following pending results: None  Home Health: Yes Equipment/Devices: None Discharge Condition: Stable CODE STATUS: Full Diet recommendation: Heart Healthy / Carb Modified   Brief/Interim Summary: Derek U Siddiquiis a 79 y.o.malewith medical history significant ofanemia, COPD, Afib on Eliquis, dementia, hypothyroidism who presented with worsening dyspnea.  Found to have an hemoglobin of 6.2 secondary to GI bleed.  Patient received 2 units of PRBC.  Hemoglobin on the day of discharge was 8.6.  He underwent EGD and colonoscopy which were negative for any source of bleeding.  Patient was on Eliquis for stroke prevention secondary to A. Fib. We discussed with his son and decided to continue Eliquis at a lower dose of 2.5 mg twice daily instead of 5 mg twice daily.  We explained the recurrent risk of GI bleed.  Patient will follow up with gastroenterology as an outpatient for capsule endoscopy.  He will resume Eliquis at a lower dose at home. Iron studies shows iron deficiency and he was given 1 time dose of Feraheme and discharged on p.o. iron supplement.  Patient developed bradycardia with escape rhythm on 07/08/2019 requiring one-time dose of atropine.  Cardiology was also consulted and his home dose of metoprolol and digoxin was held.  Heart rate remains in 60s since then without any metoprolol or digoxin.  Patient was told to keep holding metoprolol and digoxin until he will see his cardiologist for further evaluation and management.  Patient appears little dry and his  creatinine bumped to 2.4.  Started improving with some IV fluid.  We told patient to hold his home dose of Lasix and encouraged p.o. hydration.  He will need to have a repeat renal function before resuming home dose of Lasix and potassium supplement.  Patient is on sildenafil 20 mg 3 times a day, which is an appropriate dose for pulmonary hypertension.  There was no pulmonary hypertension listed in his chart and prior echocardiogram with pulmonary arterial pressure of 21. We continue his home medication and he can discuss with his cardiologist or pulmonologist.  He will continue rest of his home meds and follow-up with his PCP.  Discharge Diagnoses:  Active Problems:   Atrial fibrillation, chronic (HCC)   Symptomatic anemia   Dementia without behavioral disturbance Derek Shelton)  Discharge Instructions  Discharge Instructions    (HEART FAILURE PATIENTS) Call MD:  Anytime Shelton have any of the following symptoms: 1) 3 pound weight gain in 24 hours or 5 pounds in 1 week 2) shortness of breath, with or without a dry hacking cough 3) swelling in the hands, feet or stomach 4) if Shelton have to sleep on extra pillows at night in order to breathe.   Complete by: As directed    Diet - low sodium heart healthy   Complete by: As directed    Discharge instructions   Complete by: As directed    Derek Shelton. Please hold your Lasix, metoprolol, digoxin and potassium supplement till Shelton will see your cardiologist. These keep yourself well-hydrated and have your kidney functions checked by your PCP in 2 to 3 days.  Your creatinine  was elevated most likely due to dehydration and partly because of your low heart rate.  Shelton are holding your metoprolol and digoxin for low heart rate.  I am holding Lasix because of dehydration.  There is no need for potassium supplement if Shelton are not taking Lasix.  Shelton can resume your potassium supplement once restarting Lasix. We are also decreasing the dose of  Eliquis to 2.5 mg twice daily.  Derek can still increase the chance of bleeding, if Shelton experience any abnormal bleeding or black color stools please seek medical attention. Please follow-up with gastroenterology as an outpatient for capsule endoscopy. Please follow-up with your primary care physician and cardiologist within 1 week.   Discharge instructions   Complete by: As directed    Derek Shelton. Please keep holding your lasix for few more days and have your kidney functions rechecked before restarting Derek. We are also holding your metoprolol and digoxin due to low heart rate according to the advice of your cardiologist.  Please follow-up with your cardiologist in 1 week for further management and to discuss about restarting these medications. We decreased the dose of your Eliquis from 5mg  to 2.5 mg twice daily.  Derek will still increase the chance of bleeding and please seek medical attention if Shelton sees any abnormal bleeding or black color stools.  Shelton are also being given some iron supplement and that can also cause change in your stool color.  Please have your hemoglobin checked every month if not every 2 weeks.   Increase activity slowly   Complete by: As directed    Increase activity slowly   Complete by: As directed      Allergies as of 07/11/2019      Reactions   Aricept [donepezil Hcl] Other (See Comments)   Medication cause significant bradycardia   Catapres [clonidine Hcl]    Coreg [carvedilol]    Penicillins       Medication List    TAKE these medications   apixaban 2.5 MG Tabs tablet Commonly known as: ELIQUIS Take 1 tablet (2.5 mg total) by mouth 2 (two) times daily. What changed:   medication strength  how much to take   atorvastatin 40 MG tablet Commonly known as: LIPITOR Take 1 tablet (40 mg total) by mouth daily at 6 PM.   budesonide-formoterol 160-4.5 MCG/ACT inhaler Commonly known as: SYMBICORT Inhale 2 puffs into the lungs 2 (two)  times daily.   digoxin 0.125 MG tablet Commonly known as: LANOXIN Take 1 tablet (0.125 mg total) by mouth daily. What changed:   when to take this  additional instructions   donepezil 5 MG tablet Commonly known as: ARICEPT Take 5 mg by mouth at bedtime.   DULoxetine 60 MG capsule Commonly known as: CYMBALTA Take 60 mg by mouth daily.   ferrous sulfate 325 (65 FE) MG EC tablet Take 1 tablet (325 mg total) by mouth 2 (two) times daily.   finasteride 5 MG tablet Commonly known as: PROSCAR Take 1 tablet (5 mg total) by mouth daily.   furosemide 20 MG tablet Commonly known as: LASIX Take 40 mg by mouth daily.   ipratropium-albuterol 0.5-2.5 (3) MG/3ML Soln Commonly known as: DUONEB Inhale 3 mLs into the lungs every 6 (six) hours as needed for shortness of breath.   latanoprost 0.005 % ophthalmic solution Commonly known as: XALATAN latanoprost 0.005 % eye drops  INSTILL ONE DROP TO BOTH EYES AT BEDTIME.   levothyroxine 112 MCG tablet  Commonly known as: SYNTHROID Take 112 mcg by mouth daily before breakfast.   melatonin 3 MG Tabs tablet Take 3 mg by mouth at bedtime as needed (sleep).   metoprolol tartrate 50 MG tablet Commonly known as: LOPRESSOR Take 50 mg by mouth 2 (two) times daily.   montelukast 10 MG tablet Commonly known as: SINGULAIR Take 10 mg by mouth daily.   omega-3 acid ethyl esters 1 g capsule Commonly known as: LOVAZA Take 2 g by mouth 2 (two) times daily.   pantoprazole 40 MG tablet Commonly known as: PROTONIX Take 40 mg by mouth daily.   potassium chloride SA 20 MEQ tablet Commonly known as: KLOR-CON Take 10 mEq by mouth 3 (three) times a week. Take 1/2 tablets (10MEQ) by mouth twice daily for 3 days as directed Mon., Wed., Fri.   sildenafil 20 MG tablet Commonly known as: REVATIO Take 20 mg by mouth 3 (three) times daily.   tamsulosin 0.4 MG Caps capsule Commonly known as: FLOMAX Take 0.4 mg by mouth daily.   Tyler Aas FlexTouch 100  UNIT/ML FlexTouch Pen Generic drug: insulin degludec Inject 30 Units into the skin at bedtime.       Follow-up Information    Schedule an appointment as soon as possible for a visit with Derek Lighter, MD.   Specialty: Internal Medicine Why: Patient will need to make a follow up appointment. Contact information: [place] [address] [phone]        Schedule an appointment as soon as possible for a visit with Lucilla Lame, MD.   Specialty: Gastroenterology Why: Please make a close follow-up appointment for patient Contact information: [place]  [place] [address] [phone]        Isaias Cowman, MD. Schedule an appointment as soon as possible for a visit on 07/17/2019.   Specialty: Cardiology Why: To be seen within 1 week...Marland KitchenMarland KitchenAt 11:30am Contact information: [facility] [place] [address] [phone]              Allergies  Allergen Reactions  . Aricept [Donepezil Hcl] Other (See Comments)    Medication cause significant bradycardia  . Catapres [Clonidine Hcl]   . Coreg [Carvedilol]   . Penicillins     Consultations:  GI  Cardiology  Procedures/Studies: DG Chest 2 View  Result Date: 07/06/2019 CLINICAL DATA:  Chest pain and shortness of breath. EXAM: CHEST - 2 VIEW COMPARISON:  Chest x-ray dated May 15, 2019. FINDINGS: Stable cardiomegaly. Normal pulmonary vascularity. Mild right basilar atelectasis. No focal consolidation, pleural effusion, or pneumothorax. No acute osseous abnormality. IMPRESSION: No active cardiopulmonary disease. Electronically Signed   By: Titus Dubin M.D.   On: 07/06/2019 12:40   Subjective: Patient has no new complaints today.  He was sitting comfortably in chair.  Continue to feel weak.  He was accompanied by his son in the room.  Discharge Exam: Vitals:   07/11/19 1126 07/11/19 1328  BP: 128/70   Pulse: 67   Resp: 18   Temp:  97.9 F (36.6 C)   SpO2: 98% 93%   Vitals:   07/11/19 0451 07/11/19 0815 07/11/19 1126 07/11/19 1328  BP: 138/77 (!) 148/74 128/70   Pulse: 71 71 67   Resp:  18 18   Temp: 98 F (36.7 C) 97.7 F (36.5 C) 97.9 F (36.6 C)   TempSrc: Oral Oral Oral   SpO2: 96% 98% 98% 93%  Weight: 94.1 kg     Height:  General: Pt is alert, awake, not in acute distress Cardiovascular: RRR, S1/S2 +, no rubs, no gallops Respiratory: CTA bilaterally, no wheezing, no rhonchi Abdominal: Soft, NT, ND, bowel sounds + Extremities: no edema, no cyanosis   The results of significant diagnostics from this hospitalization (including imaging, microbiology, ancillary and laboratory) are listed below for reference.    Microbiology: Recent Results (from the past 240 hour(s))  SARS Coronavirus 2 by RT PCR (hospital order, performed in [facility] hospital lab) Nasopharyngeal Nasopharyngeal Swab     Status: None   Collection Time: 07/06/19  3:36 PM   Specimen: Nasopharyngeal Swab  Result Value Ref Range Status   SARS Coronavirus 2 NEGATIVE NEGATIVE Final    Comment: (NOTE) SARS-CoV-2 target nucleic acids are NOT DETECTED.  The SARS-CoV-2 RNA is generally detectable in upper and lower respiratory specimens during the acute phase of infection. The lowest concentration of SARS-CoV-2 viral copies this assay can detect is 250 copies / mL. A negative result does not preclude SARS-CoV-2 infection and should not be used as the sole basis for treatment or other patient management decisions.  A negative result may occur with improper specimen collection / handling, submission of specimen other than nasopharyngeal swab, presence of viral mutation(s) within the areas targeted by this assay, and inadequate number of viral copies (<250 copies / mL). A negative result must be combined with clinical observations, patient history, and epidemiological information.  Fact Sheet for Patients:    StrictlyIdeas.no  Fact Sheet for Healthcare Providers: BankingDealers.co.za  This test is not yet approved or  cleared by the [place] FDA and has been authorized for detection and/or diagnosis of SARS-CoV-2 by FDA under an Emergency Use Authorization (EUA).  This EUA will remain in effect (meaning this test can be used) for the duration of the COVID-19 declaration under Section 564(b)(1) of the Act, 21 U.S.C. section 360bbb-3(b)(1), unless the authorization is terminated or revoked sooner.  Performed at [facility], [address] [place], [place] [address]   MRSA PCR Screening     Status: None   Collection Time: 07/09/19  1:39 AM   Specimen: Nasopharyngeal  Result Value Ref Range Status   MRSA by PCR NEGATIVE NEGATIVE Final    Comment:        The GeneXpert MRSA Assay (FDA approved for NASAL specimens only), is one component of a comprehensive MRSA colonization surveillance program. Derek is not intended to diagnose MRSA infection nor to guide or monitor treatment for MRSA infections. Performed at [facility], [address] [place],  [address]      Labs: BNP (last 3 results) Recent Labs    07/06/19 1142  BNP 269.4*   Basic Metabolic Panel: Recent Labs  Lab 07/07/19 0608 07/07/19 0608 07/08/19 0638 07/09/19 0120 07/10/19 0430 07/10/19 1454 07/11/19 0427  NA 136   < > 136 134* 135 132* 134*  K 4.0   < > 3.5 4.4 4.2 4.0 4.0  CL 100   < > 101 101 100 99 101  CO2 28   < > 28 23 27 27 24   GLUCOSE 131*   < > 129* 183* 136* 137* 148*  BUN 28*   < > 20 16 26* 26* 22  CREATININE 1.36*   < > 1.05 1.23 2.48* 2.22* 1.84*  CALCIUM 8.3*   < > 8.2* 8.3* 8.6* 8.3* 8.2*  MG 2.2  --  2.3 2.3  --   --   --    < > =  values in this interval not displayed.   Liver Function Tests: No results for input(s): AST, ALT, ALKPHOS, BILITOT, PROT, ALBUMIN in the last 168 hours. No results for input(s):  LIPASE, AMYLASE in the last 168 hours. No results for input(s): AMMONIA in the last 168 hours. CBC: Recent Labs  Lab 07/08/19 0638 07/08/19 1531 07/09/19 0120 07/10/19 0430 07/11/19 0427  WBC 8.6 9.0 10.3 9.2 6.7  NEUTROABS  --  6.6  --   --   --   HGB 7.0* 8.5* 9.6* 8.6* 8.3*  HCT 23.1* 27.6* 30.0* 27.2* 25.8*  MCV 75.7* 77.7* 76.3* 78.8* 77.0*  PLT 201 205 225 187 148*   Cardiac Enzymes: No results for input(s): CKTOTAL, CKMB, CKMBINDEX, TROPONINI in the last 168 hours. BNP: Invalid input(s): POCBNP CBG: Recent Labs  Lab 07/09/19 0143 07/10/19 1140 07/10/19 2054 07/11/19 0815 07/11/19 1126  GLUCAP 184* 141* 142* 122* 108*   D-Dimer No results for input(s): DDIMER in the last 72 hours. Hgb A1c No results for input(s): HGBA1C in the last 72 hours. Lipid Profile No results for input(s): CHOL, HDL, LDLCALC, TRIG, CHOLHDL, LDLDIRECT in the last 72 hours. Thyroid function studies No results for input(s): TSH, T4TOTAL, T3FREE, THYROIDAB in the last 72 hours.  Invalid input(s): FREET3 Anemia work up Recent Labs    07/11/19 0427  TIBC 410  IRON 29*   Urinalysis    Component Value Date/Time   COLORURINE YELLOW (A) 04/06/2019 1954   APPEARANCEUR CLEAR (A) 04/06/2019 1954   APPEARANCEUR Clear 07/17/2018 1601   LABSPEC 1.012 04/06/2019 1954   LABSPEC 1.004 05/06/2014 2100   PHURINE 5.0 04/06/2019 [place] NEGATIVE 04/06/2019 1954   GLUCOSEU Negative 05/06/2014 2100   HGBUR NEGATIVE 04/06/2019 [place] NEGATIVE 04/06/2019 1954   BILIRUBINUR Negative 07/17/2018 1601   BILIRUBINUR Negative 05/06/2014 2100   KETONESUR NEGATIVE 04/06/2019 1954   PROTEINUR NEGATIVE 04/06/2019 1954   NITRITE NEGATIVE 04/06/2019 1954   LEUKOCYTESUR NEGATIVE 04/06/2019 1954   LEUKOCYTESUR Negative 05/06/2014 2100   Sepsis Labs Invalid input(s): PROCALCITONIN,  WBC,  LACTICIDVEN Microbiology Recent Results (from the past 240 hour(s))  SARS Coronavirus 2 by RT PCR  (hospital order, performed in [place] hospital lab) Nasopharyngeal Nasopharyngeal Swab     Status: None   Collection Time: 07/06/19  3:36 PM   Specimen: Nasopharyngeal Swab  Result Value Ref Range Status   SARS Coronavirus 2 NEGATIVE NEGATIVE Final    Comment: (NOTE) SARS-CoV-2 target nucleic acids are NOT DETECTED.  The SARS-CoV-2 RNA is generally detectable in upper and lower respiratory specimens during the acute phase of infection. The lowest concentration of SARS-CoV-2 viral copies this assay can detect is 250 copies / mL. A negative result does not preclude SARS-CoV-2 infection and should not be used as the sole basis for treatment or other patient management decisions.  A negative result may occur with improper specimen collection / handling, submission of specimen other than nasopharyngeal swab, presence of viral mutation(s) within the areas targeted by this assay, and inadequate number of viral copies (<250 copies / mL). A negative result must be combined with clinical observations, patient history, and epidemiological information.  Fact Sheet for Patients:   StrictlyIdeas.no  Fact Sheet for Healthcare Providers: BankingDealers.co.za  This test is not yet approved or  cleared by the [place] FDA and has been authorized for detection and/or diagnosis of SARS-CoV-2 by FDA under an Emergency Use Authorization (EUA).  This EUA will remain in effect (meaning this  test can be used) for the duration of the COVID-19 declaration under Section 564(b)(1) of the Act, 21 U.S.C. section 360bbb-3(b)(1), unless the authorization is terminated or revoked sooner.  Performed at [facility], [address] [place], [place] [address]   MRSA PCR Screening     Status: None   Collection Time: 07/09/19  1:39 AM   Specimen: Nasopharyngeal  Result Value Ref Range Status   MRSA by PCR NEGATIVE NEGATIVE Final    Comment:         The GeneXpert MRSA Assay (FDA approved for NASAL specimens only), is one component of a comprehensive MRSA colonization surveillance program. Derek is not intended to diagnose MRSA infection nor to guide or monitor treatment for MRSA infections. Performed at [facility], [address] [place], [place] [address]     Time coordinating discharge: Over 30 minutes  SIGNED:  Lorella Nimrod, MD  [facility] 07/11/2019, 1:45 PM  If 7PM-7AM, please contact night-coverage [email or web address]  This record has been created using Systems analyst. Errors have been sought and corrected,but may not always be located. Such creation errors do not reflect on the standard of care.

## 2019-07-10 NOTE — Progress Notes (Signed)
Patient has not void since previous shift.   Bladder scan performed-  330 ml noted  Paged MD-  MD gave order to do an I&O I&O output -  350 ml Patient tolerated moderately well; discomfort with insertion & removal.    Post cath bladder scan - 0 ml  Will continue to monitor

## 2019-07-10 NOTE — Progress Notes (Signed)
6am v/s's not done due to pt wanting to sleep.  Son at bs who is a physician requested not to be done until he awakens.

## 2019-07-10 NOTE — Op Note (Signed)
[facility] Gastroenterology Patient Name: Derek Shelton Procedure Date: 07/10/2019 12:16 PM MRN: [id number] Account #: [id number] Date of Birth: [birth year] Admit Type: Inpatient Age: 79 Room: [facility] ENDO ROOM 1 Gender: Male Note Status: Finalized Procedure:             Upper GI endoscopy Indications:           Acute post hemorrhagic anemia Providers:             Lucilla Lame MD, MD Referring MD:          Gladstone Lighter, MD (Referring MD) Medicines:             Propofol per Anesthesia Complications:         No immediate complications. Procedure:             Pre-Anesthesia Assessment:                        - Prior to the procedure, a History and Physical was                         performed, and patient medications and allergies were                         reviewed. The patient's tolerance of previous                         anesthesia was also reviewed. The risks and benefits                         of the procedure and the sedation options and risks                         were discussed with the patient. All questions were                         answered, and informed consent was obtained. Prior                         Anticoagulants: The patient has taken no previous                         anticoagulant or antiplatelet agents. ASA Grade                         Assessment: II - A patient with mild systemic disease.                         After reviewing the risks and benefits, the patient                         was deemed in satisfactory condition to undergo the                         procedure.                        After obtaining informed consent, the endoscope was  passed under direct vision. Throughout the procedure,                         the patient's blood pressure, pulse, and oxygen                         saturations were monitored continuously. The Endoscope                         was introduced through the mouth, and  advanced to the                         second part of duodenum. The upper GI endoscopy was                         accomplished without difficulty. The patient tolerated                         the procedure well. Findings:      The examined esophagus was normal.      The entire examined stomach was normal.      The examined duodenum was normal. Impression:            - Normal esophagus.                        - Normal stomach.                        - Normal examined duodenum.                        - No specimens collected. Recommendation:        - Resume previous diet.                        - Continue present medications.                        - Return patient to hospital ward for ongoing care.                        - Perform a colonoscopy today. Procedure Code(s):     --- Professional ---                        [phone], Esophagogastroduodenoscopy, flexible,                         transoral; diagnostic, including collection of                         specimen(s) by brushing or washing, when performed                         (separate procedure) Diagnosis Code(s):     --- Professional ---                        D62, Acute posthemorrhagic anemia CPT copyright 2019 American Medical Association. All rights reserved. The codes documented in this report are preliminary and upon coder review may  be revised  to meet current compliance requirements. Lucilla Lame MD, MD 07/10/2019 12:29:48 PM This report has been signed electronically. Number of Addenda: 0 Note Initiated On: 07/10/2019 12:16 PM Estimated Blood Loss:  Estimated blood loss: none.      [facility]

## 2019-07-10 NOTE — Anesthesia Postprocedure Evaluation (Signed)
Anesthesia Post Note  Patient: Derek Shelton  Procedure(s) Performed: ESOPHAGOGASTRODUODENOSCOPY (EGD) WITH PROPOFOL (N/A ) COLONOSCOPY WITH PROPOFOL (N/A )  Patient location during evaluation: Endoscopy Anesthesia Type: General Level of consciousness: awake and alert Pain management: pain level controlled Vital Signs Assessment: post-procedure vital signs reviewed and stable Respiratory status: spontaneous breathing, nonlabored ventilation and respiratory function stable Cardiovascular status: blood pressure returned to baseline and stable Postop Assessment: no apparent nausea or vomiting Anesthetic complications: no   No complications documented.   Last Vitals:  Vitals:   07/10/19 1316 07/10/19 1336  BP: 123/66 (!) 146/58  Pulse: 78 77  Resp: 20 18  Temp:  36.7 C  SpO2: 99% 97%    Last Pain:  Vitals:   07/10/19 1336  TempSrc: Oral  PainSc: 0-No pain                 Alphonsus Sias

## 2019-07-10 NOTE — Progress Notes (Signed)
PROGRESS NOTE    Derek Shelton  KXF:[id number] DOB: 10-08-1940 DOA: 07/06/2019 PCP: Gladstone Lighter, MD   Brief Narrative:  Derek Shelton a 79 y.o.malewith medical history significant ofanemia, COPD, Afib on Eliquis, dementia, hypothyroidism who presented with worsening dyspnea.  Found to have an hemoglobin of 6.2 secondary to GI bleed. Developed bradycardia last night resulted in postponement of EGD and colonoscopy.  Subjective: No new complaints today.  He was waiting for EGD and colonoscopy.  Assessment & Plan:   Active Problems:   Atrial fibrillation, chronic (HCC)   Symptomatic anemia   Dementia without behavioral disturbance (HCC)  Symptomatic anemia secondary to GI bleed.  Likely the cause of exertional dyspnea.  Hemoglobin was 6.2 on admission, improved to 8.6 today, received 2 units PRBC. -EGD and colonoscopy today without any obvious source of bleeding. GI is recommending capsule endoscopy which can be done as an outpatient. -Continue PPI IV twice daily. -Continue to monitor hemoglobin.  Bradycardia.  Patient developed bradycardia with heart rate in 30s overnight.  Remained asymptomatic.  Patient's home dose of metoprolol and digoxin was held.  He was given one-time dose of atropine.  Heart rate remained in the 60s since then.  Denies any chest pain. -Cardiology was consulted-no need for any further cardiac work-up. -Keep holding metoprolol and digoxin.  AKI.  Patient developed AKI with creatinine of 2.4 today with baseline around 1.3.  Most likely prerenal. -Give some IV fluid. -Monitor renal function.  History of A. Fib.  -Holding home dose of metoprolol and digoxin for bradycardia. -We will restart Eliquis at a lower dose of 2.5 mg twice daily from tomorrow.  COPD, stable -continue home Symbicort as Breo   Dementia -continue home Aricept and Cymbalta   Hypothyroidism -continue home Synthroid  Objective: Vitals:   07/10/19 1306 07/10/19  1316 07/10/19 1336 07/10/19 1601  BP: (!) 132/53 123/66 (!) 146/58 (!) 149/76  Pulse: 80 78 77 65  Resp: 20 20 18 16   Temp:   98.1 F (36.7 C)   TempSrc:   Oral   SpO2: 98% 99% 97% 99%  Weight:      Height:        Intake/Output Summary (Last 24 hours) at 07/10/2019 1647 Last data filed at 07/10/2019 1237 Gross per 24 hour  Intake 200 ml  Output --  Net 200 ml   Filed Weights   07/06/19 1139 07/10/19 0129 07/10/19 1144  Weight: 87.1 kg 90.9 kg 90.9 kg    Examination:  General exam: Chronically ill-appearing gentleman, appears calm and comfortable  Respiratory system: Clear to auscultation. Respiratory effort normal. Cardiovascular system: S1 & S2 heard, RRR. No JVD, murmurs, rubs,  Gastrointestinal system: Soft, nontender, nondistended, bowel sounds positive. Central nervous system: Alert and oriented. No focal neurological deficits. Extremities: No edema, no cyanosis, pulses intact and symmetrical. Psychiatry: Judgement and insight appear normal.   DVT prophylaxis: SCDs Code Status: Full Family Communication: Son was updated at bedside Disposition Plan:  Status is: Inpatient  Remains inpatient appropriate because:Inpatient level of care appropriate due to severity of illness   Dispo: The patient is from: Home              Anticipated d/c is to: Home              Anticipated d/c date is: 1 day              Patient currently is not medically stable to d/c.  Patient developed some AKI requiring some  IV hydration.  Consultants:   GI  Cardiology  Procedures:  Antimicrobials:   Data Reviewed: I have personally reviewed following labs and imaging studies  CBC: Recent Labs  Lab 07/07/19 0608 07/08/19 0638 07/08/19 1531 07/09/19 0120 07/10/19 0430  WBC 7.6 8.6 9.0 10.3 9.2  NEUTROABS  --   --  6.6  --   --   HGB 7.3* 7.0* 8.5* 9.6* 8.6*  HCT 23.5* 23.1* 27.6* 30.0* 27.2*  MCV 75.6* 75.7* 77.7* 76.3* 78.8*  PLT 199 201 205 225 818   Basic Metabolic  Panel: Recent Labs  Lab 07/07/19 0608 07/08/19 0638 07/09/19 0120 07/10/19 0430 07/10/19 1454  NA 136 136 134* 135 132*  K 4.0 3.5 4.4 4.2 4.0  CL 100 101 101 100 99  CO2 28 28 23 27 27   GLUCOSE 131* 129* 183* 136* 137*  BUN 28* 20 16 26* 26*  CREATININE 1.36* 1.05 1.23 2.48* 2.22*  CALCIUM 8.3* 8.2* 8.3* 8.6* 8.3*  MG 2.2 2.3 2.3  --   --    GFR: Estimated Creatinine Clearance: 29.1 mL/min (A) (by C-G formula based on SCr of 2.22 mg/dL (H)). Liver Function Tests: No results for input(s): AST, ALT, ALKPHOS, BILITOT, PROT, ALBUMIN in the last 168 hours. No results for input(s): LIPASE, AMYLASE in the last 168 hours. No results for input(s): AMMONIA in the last 168 hours. Coagulation Profile: No results for input(s): INR, PROTIME in the last 168 hours. Cardiac Enzymes: No results for input(s): CKTOTAL, CKMB, CKMBINDEX, TROPONINI in the last 168 hours. BNP (last 3 results) No results for input(s): PROBNP in the last 8760 hours. HbA1C: No results for input(s): HGBA1C in the last 72 hours. CBG: Recent Labs  Lab 07/07/19 0758 07/07/19 1155 07/07/19 1553 07/09/19 0143 07/10/19 1140  GLUCAP 127* 134* 175* 184* 141*   Lipid Profile: No results for input(s): CHOL, HDL, LDLCALC, TRIG, CHOLHDL, LDLDIRECT in the last 72 hours. Thyroid Function Tests: No results for input(s): TSH, T4TOTAL, FREET4, T3FREE, THYROIDAB in the last 72 hours. Anemia Panel: No results for input(s): VITAMINB12, FOLATE, FERRITIN, TIBC, IRON, RETICCTPCT in the last 72 hours. Sepsis Labs: No results for input(s): PROCALCITON, LATICACIDVEN in the last 168 hours.  Recent Results (from the past 240 hour(s))  SARS Coronavirus 2 by RT PCR (hospital order, performed in [facility] hospital lab) Nasopharyngeal Nasopharyngeal Swab     Status: None   Collection Time: 07/06/19  3:36 PM   Specimen: Nasopharyngeal Swab  Result Value Ref Range Status   SARS Coronavirus 2 NEGATIVE NEGATIVE Final    Comment:  (NOTE) SARS-CoV-2 target nucleic acids are NOT DETECTED.  The SARS-CoV-2 RNA is generally detectable in upper and lower respiratory specimens during the acute phase of infection. The lowest concentration of SARS-CoV-2 viral copies this assay can detect is 250 copies / mL. A negative result does not preclude SARS-CoV-2 infection and should not be used as the sole basis for treatment or other patient management decisions.  A negative result may occur with improper specimen collection / handling, submission of specimen other than nasopharyngeal swab, presence of viral mutation(s) within the areas targeted by this assay, and inadequate number of viral copies (<250 copies / mL). A negative result must be combined with clinical observations, patient history, and epidemiological information.  Fact Sheet for Patients:   StrictlyIdeas.no  Fact Sheet for Healthcare Providers: BankingDealers.co.za  This test is not yet approved or  cleared by the [place] FDA and has been authorized for detection and/or  diagnosis of SARS-CoV-2 by FDA under an Emergency Use Authorization (EUA).  This EUA will remain in effect (meaning this test can be used) for the duration of the COVID-19 declaration under Section 564(b)(1) of the Act, 21 U.S.C. section 360bbb-3(b)(1), unless the authorization is terminated or revoked sooner.  Performed at [facility], [address] [place], [place] [address]   MRSA PCR Screening     Status: None   Collection Time: 07/09/19  1:39 AM   Specimen: Nasopharyngeal  Result Value Ref Range Status   MRSA by PCR NEGATIVE NEGATIVE Final    Comment:        The GeneXpert MRSA Assay (FDA approved for NASAL specimens only), is one component of a comprehensive MRSA colonization surveillance program. It is not intended to diagnose MRSA infection nor to guide or monitor treatment for MRSA infections. Performed at  [facility], [address] [place],  [address]      Radiology Studies: No results found.  Scheduled Meds: . atorvastatin  40 mg Oral q1800  . atropine  0.5 mg Intravenous Once  . Chlorhexidine Gluconate Cloth  6 each Topical Daily  . DULoxetine  60 mg Oral Daily  . finasteride  5 mg Oral Daily  . fluticasone furoate-vilanterol  1 puff Inhalation Daily  . levothyroxine  112 mcg Oral Q0600  . montelukast  10 mg Oral QHS  . pantoprazole (PROTONIX) IV  40 mg Intravenous Q12H  . tamsulosin  0.4 mg Oral Daily   Continuous Infusions: . sodium chloride    . sodium chloride 100 mL/hr at 07/10/19 1040  . sodium chloride       LOS: 4 days   Time spent: 40 minutes.  Lorella Nimrod, MD [facility]  If 7PM-7AM, please contact night-coverage [email or web address]  07/10/2019, 4:47 PM   This record has been created using Systems analyst. Errors have been sought and corrected,but may not always be located. Such creation errors do not reflect on the standard of care.

## 2019-07-10 NOTE — Transfer of Care (Signed)
Immediate Anesthesia Transfer of Care Note  Patient: Derek Shelton  Procedure(s) Performed: ESOPHAGOGASTRODUODENOSCOPY (EGD) WITH PROPOFOL (N/A ) COLONOSCOPY WITH PROPOFOL (N/A )  Patient Location: PACU and Endoscopy Unit  Anesthesia Type:General  Level of Consciousness: sedated  Airway & Oxygen Therapy: Patient Spontanous Breathing  Post-op Assessment: Report given to RN  Post vital signs: stable  Last Vitals:  Vitals Value Taken Time  BP    Temp    Pulse    Resp    SpO2      Last Pain:  Vitals:   07/10/19 1144  TempSrc: Temporal  PainSc: 0-No pain         Complications: No complications documented.

## 2019-07-10 NOTE — Consult Note (Signed)
Patient Name: Derek Shelton Date of Encounter: 07/10/2019  Hospital Problem List     Active Problems:   Symptomatic anemia   Dementia without behavioral disturbance [facility])    Patient Profile     79 y.o. male with history of anemia, COPD, dementia, atrial fibrillation anticoagulated with Eliquis who was admitted after noting worsening dyspnea on 07/06/2019.  EKG at that time showed atrial fibrillation with variable ventricular response.  He developed bradycardia last p.m. in the 30s.  Apparently was asymptomatic at the time but awake.  He was hemodynamically stable by chart with pressures above 100.  This a.m. he has a heart rate in the 60s.  Rhythm is regular.  P waves are difficult to assess however appears to be sinus rhythm.  Twelve-lead is pending.  Patient was on metoprolol as well as digoxin as an outpatient.  These were both held yesterday.  He is a difficult historian due to his underlying memory disorder.  Currently has been treated with atorvastatin, Cymbalta 60 mg daily, finasteride, montelukast.  He is being evaluated by gastroenterology.  Serum troponins were drawn today which were unremarkable.  Electrolytes are unremarkable with potassium of 4.4.  Creatinine 1.23.  Magnesium is 2.3.  Was undergoing bowel prep last p.m. in preparation for colonoscopy.  Subjective  Difficult historian but no complaints today Inpatient Medications    . atorvastatin  40 mg Oral q1800  . atropine  0.5 mg Intravenous Once  . Chlorhexidine Gluconate Cloth  6 each Topical Daily  . DULoxetine  60 mg Oral Daily  . finasteride  5 mg Oral Daily  . fluticasone furoate-vilanterol  1 puff Inhalation Daily  . levothyroxine  112 mcg Oral Q0600  . montelukast  10 mg Oral QHS  . pantoprazole (PROTONIX) IV  40 mg Intravenous Q12H    Vital Signs    Vitals:   07/09/19 2130 07/09/19 2322 07/10/19 0129 07/10/19 0743  BP: (!) 154/76 123/80 (!) 164/67 (!) 151/69  Pulse: (!) 58 62 (!) 55 60  Resp: 16 (!)  21  18  Temp:   99 F (37.2 C) 97.8 F (36.6 C)  TempSrc:   Oral Oral  SpO2: 97% 96% 99% 96%  Weight:   90.9 kg   Height:   5\' 8"  (1.727 m)     Intake/Output Summary (Last 24 hours) at 07/10/2019 0951 Last data filed at 07/09/2019 1000 Gross per 24 hour  Intake 0 ml  Output --  Net 0 ml   Filed Weights   07/06/19 1139 07/10/19 0129  Weight: 87.1 kg 90.9 kg    Physical Exam    GEN: Well nourished, well developed, in no acute distress.  HEENT: normal.  Neck: Supple, no JVD, carotid bruits, or masses. Cardiac: RRR, no murmurs, rubs, or gallops. No clubbing, cyanosis, edema.  Radials/DP/PT 2+ and equal bilaterally.  Respiratory:  Respirations regular and unlabored, clear to auscultation bilaterally. GI: Soft, nontender, nondistended, BS + x 4. MS: no deformity or atrophy. Skin: warm and dry, no rash. Neuro:  Strength and sensation are intact. Psych: Normal affect.  Labs    CBC Recent Labs    07/08/19 1531 07/08/19 1531 07/09/19 0120 07/10/19 0430  WBC 9.0   < > 10.3 9.2  NEUTROABS 6.6  --   --   --   HGB 8.5*   < > 9.6* 8.6*  HCT 27.6*   < > 30.0* 27.2*  MCV 77.7*   < > 76.3* 78.8*  PLT 205   < >  225 187   < > = values in this interval not displayed.   Basic Metabolic Panel Recent Labs    07/08/19 0638 07/08/19 0638 07/09/19 0120 07/10/19 0430  NA 136   < > 134* 135  K 3.5   < > 4.4 4.2  CL 101   < > 101 100  CO2 28   < > 23 27  GLUCOSE 129*   < > 183* 136*  BUN 20   < > 16 26*  CREATININE 1.05   < > 1.23 2.48*  CALCIUM 8.2*   < > 8.3* 8.6*  MG 2.3  --  2.3  --    < > = values in this interval not displayed.   Liver Function Tests No results for input(s): AST, ALT, ALKPHOS, BILITOT, PROT, ALBUMIN in the last 72 hours. No results for input(s): LIPASE, AMYLASE in the last 72 hours. Cardiac Enzymes No results for input(s): CKTOTAL, CKMB, CKMBINDEX, TROPONINI in the last 72 hours. BNP No results for input(s): BNP in the last 72 hours. D-Dimer No  results for input(s): DDIMER in the last 72 hours. Hemoglobin A1C No results for input(s): HGBA1C in the last 72 hours. Fasting Lipid Panel No results for input(s): CHOL, HDL, LDLCALC, TRIG, CHOLHDL, LDLDIRECT in the last 72 hours. Thyroid Function Tests No results for input(s): TSH, T4TOTAL, T3FREE, THYROIDAB in the last 72 hours.  Invalid input(s): FREET3  Telemetry    Probable sinus rhythm  ECG       Radiology    DG Chest 2 View  Result Date: 07/06/2019 CLINICAL DATA:  Chest pain and shortness of breath. EXAM: CHEST - 2 VIEW COMPARISON:  Chest x-ray dated May 15, 2019. FINDINGS: Stable cardiomegaly. Normal pulmonary vascularity. Mild right basilar atelectasis. No focal consolidation, pleural effusion, or pneumothorax. No acute osseous abnormality. IMPRESSION: No active cardiopulmonary disease. Electronically Signed   By: Titus Dubin M.D.   On: 07/06/2019 12:40    Assessment & Plan    Heart rate is improved off of rate related medications.  Would remain off of these.  Remain off of Eliquis until GI work-up is complete.  Okay to proceed with GI procedures.  Patient is at low risk from a cardiac standpoint.  Signed, Javier Docker Sharron Petruska MD 07/10/2019, 9:51 AM  Pager: [phone]

## 2019-07-11 ENCOUNTER — Encounter: Payer: Self-pay | Admitting: Gastroenterology

## 2019-07-11 DIAGNOSIS — I482 Chronic atrial fibrillation, unspecified: Secondary | ICD-10-CM

## 2019-07-11 DIAGNOSIS — K922 Gastrointestinal hemorrhage, unspecified: Secondary | ICD-10-CM

## 2019-07-11 DIAGNOSIS — R0602 Shortness of breath: Secondary | ICD-10-CM

## 2019-07-11 LAB — IRON AND TIBC
Iron: 29 ug/dL — ABNORMAL LOW (ref 45–182)
Saturation Ratios: 7 % — ABNORMAL LOW (ref 17.9–39.5)
TIBC: 410 ug/dL (ref 250–450)
UIBC: 381 ug/dL

## 2019-07-11 LAB — CBC
HCT: 25.8 % — ABNORMAL LOW (ref 39.0–52.0)
Hemoglobin: 8.3 g/dL — ABNORMAL LOW (ref 13.0–17.0)
MCH: 24.8 pg — ABNORMAL LOW (ref 26.0–34.0)
MCHC: 32.2 g/dL (ref 30.0–36.0)
MCV: 77 fL — ABNORMAL LOW (ref 80.0–100.0)
Platelets: 148 10*3/uL — ABNORMAL LOW (ref 150–400)
RBC: 3.35 MIL/uL — ABNORMAL LOW (ref 4.22–5.81)
RDW: 22.8 % — ABNORMAL HIGH (ref 11.5–15.5)
WBC: 6.7 10*3/uL (ref 4.0–10.5)
nRBC: 0 % (ref 0.0–0.2)

## 2019-07-11 LAB — BASIC METABOLIC PANEL
Anion gap: 9 (ref 5–15)
BUN: 22 mg/dL (ref 8–23)
CO2: 24 mmol/L (ref 22–32)
Calcium: 8.2 mg/dL — ABNORMAL LOW (ref 8.9–10.3)
Chloride: 101 mmol/L (ref 98–111)
Creatinine, Ser: 1.84 mg/dL — ABNORMAL HIGH (ref 0.61–1.24)
GFR calc Af Amer: 39 mL/min — ABNORMAL LOW (ref >60)
GFR calc non Af Amer: 34 mL/min — ABNORMAL LOW (ref >60)
Glucose, Bld: 148 mg/dL — ABNORMAL HIGH (ref 70–99)
Potassium: 4 mmol/L (ref 3.5–5.1)
Sodium: 134 mmol/L — ABNORMAL LOW (ref 135–145)

## 2019-07-11 LAB — GLUCOSE, CAPILLARY
Glucose-Capillary: 122 mg/dL — ABNORMAL HIGH (ref 70–99)
Glucose-Capillary: 108 mg/dL — ABNORMAL HIGH (ref 70–99)

## 2019-07-11 MED ORDER — FERROUS SULFATE 325 (65 FE) MG PO TBEC
325.0000 mg | DELAYED_RELEASE_TABLET | Freq: Two times a day (BID) | ORAL | 3 refills | Status: DC
Start: 2019-07-11 — End: 2020-01-17

## 2019-07-11 MED ORDER — SODIUM CHLORIDE 0.9 % IV SOLN
510.0000 mg | Freq: Once | INTRAVENOUS | Status: AC
  Administered 2019-07-11: 510 mg via INTRAVENOUS
  Filled 2019-07-11: qty 17

## 2019-07-11 NOTE — Progress Notes (Signed)
No further workup indicated from a cardiac standpoint at this time.  Signing off for now.  Please call with questions or concerns.

## 2019-07-11 NOTE — Progress Notes (Signed)
Patient discharging home. IV taken out and tele monitor off. Discharge instructions given to patient and son at bedside. Appointments made. No questions or concerns.

## 2019-07-11 NOTE — Plan of Care (Signed)
  Problem: Clinical Measurements: Goal: Ability to maintain clinical measurements within normal limits will improve Outcome: Adequate for Discharge Goal: Will remain free from infection Outcome: Adequate for Discharge Goal: Diagnostic test results will improve Outcome: Adequate for Discharge Goal: Respiratory complications will improve Outcome: Adequate for Discharge   Problem: Activity: Goal: Risk for activity intolerance will decrease Outcome: Adequate for Discharge   Problem: Coping: Goal: Level of anxiety will decrease Outcome: Adequate for Discharge   Problem: Pain Managment: Goal: General experience of comfort will improve Outcome: Adequate for Discharge   Problem: Safety: Goal: Ability to remain free from injury will improve Outcome: Adequate for Discharge

## 2019-07-11 NOTE — TOC Initial Note (Signed)
Transition of Care [facility]) - Initial/Assessment Note    Patient Details  Name: Derek Shelton MRN: [id number] Date of Birth: 1940/06/25  Transition of Care [place]) CM/SW Contact:    Eileen Stanford, LCSW Phone Number: 07/11/2019, 2:10 PM  Clinical Narrative:   Pt's son present at bedside. Pt's son states pt lives with his mom. Pt's son agreeable for pt to receive home health Pt's son states pt has a wheelchair and walker at home. Pt's son states there is no additional equipment needed at this time.  [facility] will service pt starting on Monday 6/21--MD aware.                 Expected Discharge Plan: [place] Barriers to Discharge: No Barriers Identified   Patient Goals and CMS Choice Patient states their goals for this hospitalization and ongoing recovery are:: get better   Choice offered to / list presented to : Adult Children  Expected Discharge Plan and Services Expected Discharge Plan: [place] In-house Referral: Clinical Social Work Discharge Planning Services: CM Consult Post Acute Care Choice: [place] arrangements for the past 2 months: [place] Expected Discharge Date: 07/11/19               DME Arranged: N/A         HH Arranged: RN, PT, Nurse's Aide HH Agency: [place] ([place]) Date [place]: 07/11/19 Time [place]: 69 Representative spoke with at [place]: M[place] Arrangements/Services Living arrangements for the past 2 months: [place] with:: Spouse Patient language and need for interpreter reviewed:: Yes Do you feel safe going back to the place where you live?: Yes      Need for Family Participation in Patient Care: Yes (Comment) Care giver support system in place?: Yes (comment) Current home services: DME (rolling walker,  cane, standard walker) Criminal Activity/Legal Involvement Pertinent to Current Situation/Hospitalization: No -  Comment as needed  Activities of Daily Living Home Assistive Devices/Equipment: Walker (specify type), Cane (specify quad or straight) ADL Screening (condition at time of admission) Patient's cognitive ability adequate to safely complete daily activities?: No Is the patient deaf or have difficulty hearing?: No Does the patient have difficulty seeing, even when wearing glasses/contacts?: No Does the patient have difficulty concentrating, remembering, or making decisions?: Yes Patient able to express need for assistance with ADLs?: No Does the patient have difficulty dressing or bathing?: Yes Independently performs ADLs?: No Communication: Independent (speaks [name]) Dressing (OT): Needs assistance Is this a change from baseline?: Pre-admission baseline Grooming: Dependent Is this a change from baseline?: Pre-admission baseline Feeding: Needs assistance Is this a change from baseline?: Pre-admission baseline Bathing: Dependent Is this a change from baseline?: Pre-admission baseline Toileting: Needs assistance Is this a change from baseline?: Pre-admission baseline In/Out Bed: Needs assistance Is this a change from baseline?: Pre-admission baseline Walks in Home: Independent with device (comment) Does the patient have difficulty walking or climbing stairs?: Yes Weakness of Legs: Both Weakness of Arms/Hands: None  Permission Sought/Granted Permission sought to share information with : Family Supports Permission granted to share information with : Yes, Verbal Permission Granted        Permission granted to share info w Relationship: son     Emotional Assessment Appearance:: Appears stated age Attitude/Demeanor/Rapport: Engaged Affect (typically observed): Appropriate Orientation: : Oriented to Situation, Oriented to  Time, Oriented to Place, Oriented to Self Alcohol / Substance Use:  Not Applicable Psych Involvement: No (comment)  Admission diagnosis:  Symptomatic anemia  [D64.9] Patient Active Problem List   Diagnosis Date Noted  . Dementia without behavioral disturbance ([place])   . Symptomatic anemia 07/06/2019  . BPH (benign prostatic hyperplasia) 07/18/2018  . Degeneration of lumbar intervertebral disc 07/17/2018  . Osteoarthritis of knee 07/17/2018  . Spinal stenosis of lumbar region 07/17/2018  . Acute CVA (cerebrovascular accident) ([place]) 12/02/2017  . Acute ischemic stroke ([place]) 12/02/2017  . Acute metabolic encephalopathy due to hypoglycemia 11/29/2017  . Essential hypertension 11/29/2017  . Syncope and collapse 11/28/2017  . Atrial fibrillation, chronic ([place]) 05/22/2017  . Chest pain 02/21/2017  . AKI (acute kidney injury) ([place]) 02/21/2017  . SOB (shortness of breath) 02/08/2017  . Atrial fibrillation with RVR ([place]) 02/08/2017  . GI bleeding 08/06/2014  . Anemia 08/06/2014  . Bradycardia 08/06/2014  . Atrial fibrillation ([place]) 08/06/2014  . Hyponatremia 08/06/2014  . Chronic diastolic heart failure ([place]) 08/06/2014  . OSA (obstructive sleep apnea) 08/06/2014  . Memory loss or impairment 05/29/2014  . Type 2 diabetes mellitus, without long-term current use of insulin ([place]) 08/11/2010  . 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 Chronic rhinitis 08/11/2010  . Coronary artery disease 08/11/2010  . Esophageal reflux 08/11/2010  . Personal history of arthritis 08/11/2010  . Tear film insufficiency 06/07/2010  . 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 Keratoconjunctivitis sicca ([place]) 03/10/2010  . Lens replaced 03/10/2010  . Pterygium 03/10/2010   PCP:  Gladstone Lighter, MD Pharmacy:   [id number] [address], [place] [address] Phone: [phone] Fax: [phone]     Social Determinants of Health (SDOH) Interventions    Readmission Risk Interventions No flowsheet data found.

## 2019-07-11 NOTE — Progress Notes (Signed)
Patient voided 225ml with post void cath residual of 24. MD made aware. Patient being discharged.

## 2019-07-11 NOTE — Evaluation (Signed)
Physical Therapy Evaluation Patient Details Name: Derek Shelton MRN: [id number] DOB: September 30, 1940 Today's Date: 07/11/2019   History of Present Illness  Derek Shelton is a 79 y.o. male with medical history significant of anemia, COPD, Afib on Eliquis, dementia, hypothyroidism who presented with worsening dyspnea.  Clinical Impression  Patient received sitting up on side of bed. Son present. Patient is agreeable to PT assessment. He performs sit to stand transfers with min assist. Ambulated 25 feet with RW and min guard on room air. He is limited by fatigue and shortness of breath. O2 sats at 93% upon returning to room, but he demonstrates labored breathing. Patient requires cues for safety and pacing of mobility. He will continue to benefit from skilled PT to improve strength, activity tolerance, balance, and functional independence.      Follow Up Recommendations Home health PT;Supervision/Assistance - 24 hour    Equipment Recommendations  None recommended by PT    Recommendations for Other Services       Precautions / Restrictions Precautions Precautions: Fall Restrictions Weight Bearing Restrictions: No      Mobility  Bed Mobility               General bed mobility comments: not assessed, patient received sitting up on side of bed  Transfers Overall transfer level: Needs assistance   Transfers: Sit to/from Stand Sit to Stand: Min assist            Ambulation/Gait Ambulation/Gait assistance: Min guard Gait Distance (Feet): 25 Feet Assistive device: Rolling walker (2 wheeled) Gait Pattern/deviations: Step-through pattern Gait velocity: WFL   General Gait Details: slightly unsteady with mobility due to impulsiveness related to fatigue and SOB. Cues to slow down  Stairs            Wheelchair Mobility    Modified Rankin (Stroke Patients Only)       Balance Overall balance assessment: Needs assistance Sitting-balance support: Feet  supported Sitting balance-Leahy Scale: Good     Standing balance support: Bilateral upper extremity supported;During functional activity Standing balance-Leahy Scale: Fair Standing balance comment: reliant on RW and external assist at this time for safety                             Pertinent Vitals/Pain Pain Assessment: Faces Faces Pain Scale: Hurts a little bit Pain Location: legs Pain Descriptors / Indicators: Discomfort;Sore Pain Intervention(s): Monitored during session    Home Living Family/patient expects to be discharged to:: Private residence Living Arrangements: Spouse/significant other;Children Available Help at Discharge: Family;Available 24 hours/day Type of Home: House Home Access: Stairs to enter   [employer] of Steps: son reports he can get him in via wheelchair Home Layout: One level Home Equipment: Environmental consultant - 2 wheels;Wheelchair - manual;Cane - single point      Prior Function Level of Independence: Independent with assistive device(s)         Comments: Requires assistance from family, reports no falls.     Hand Dominance        Extremity/Trunk Assessment   Upper Extremity Assessment Upper Extremity Assessment: Generalized weakness    Lower Extremity Assessment Lower Extremity Assessment: Generalized weakness    Cervical / Trunk Assessment Cervical / Trunk Assessment: Normal  Communication   Communication: Prefers language other than English  Cognition Arousal/Alertness: Awake/alert Behavior During Therapy: WFL for tasks assessed/performed Overall Cognitive Status: History of cognitive impairments - at baseline  General Comments      Exercises     Assessment/Plan    PT Assessment Patient needs continued PT services  PT Problem List Decreased strength;Decreased mobility;Decreased activity tolerance;Decreased safety awareness;Decreased  balance;Cardiopulmonary status limiting activity;Decreased knowledge of precautions       PT Treatment Interventions DME instruction;Therapeutic activities;Gait training;Therapeutic exercise;Patient/family education;Functional mobility training;Stair training;Balance training;Neuromuscular re-education    PT Goals (Current goals can be found in the Care Plan section)  Acute Rehab PT Goals Patient Stated Goal: to return home with family PT Goal Formulation: With family Time For Goal Achievement: 07/18/19 Potential to Achieve Goals: Good    Frequency Min 2X/week   Barriers to discharge        Co-evaluation               AM-PAC PT "6 Clicks" Mobility  Outcome Measure Help needed turning from your back to your side while in a flat bed without using bedrails?: A Little Help needed moving from lying on your back to sitting on the side of a flat bed without using bedrails?: A Lot Help needed moving to and from a bed to a chair (including a wheelchair)?: A Little Help needed standing up from a chair using your arms (e.g., wheelchair or bedside chair)?: A Little Help needed to walk in hospital room?: A Little Help needed climbing 3-5 steps with a railing? : A Lot 6 Click Score: 16    End of Session Equipment Utilized During Treatment: Gait belt Activity Tolerance: Patient limited by fatigue;Other (comment) (SOB) Patient left: in chair;with family/visitor present Nurse Communication: Mobility status PT Visit Diagnosis: Unsteadiness on feet (R26.81);Muscle weakness (generalized) (M62.81);Difficulty in walking, not elsewhere classified (R26.2)    Time: 2952-8413 PT Time Calculation (min) (ACUTE ONLY): 19 min   Charges:   PT Evaluation $PT Eval Moderate Complexity: 1 Mod PT Treatments $Gait Training: 8-22 mins        Jahniyah Revere, PT, GCS 07/11/19,1:37 PM

## 2019-07-15 ENCOUNTER — Other Ambulatory Visit: Payer: Self-pay | Admitting: Internal Medicine

## 2019-07-15 ENCOUNTER — Ambulatory Visit: Payer: Medicare Other | Attending: Internal Medicine

## 2019-07-15 ENCOUNTER — Other Ambulatory Visit: Payer: Self-pay

## 2019-07-15 DIAGNOSIS — R0602 Shortness of breath: Secondary | ICD-10-CM | POA: Insufficient documentation

## 2019-07-15 DIAGNOSIS — Z09 Encounter for follow-up examination after completed treatment for conditions other than malignant neoplasm: Secondary | ICD-10-CM

## 2019-07-15 LAB — BLOOD GAS, ARTERIAL
Acid-Base Excess: 3.8 mmol/L — ABNORMAL HIGH (ref 0.0–2.0)
Bicarbonate: 28.5 mmol/L — ABNORMAL HIGH (ref 20.0–28.0)
FIO2: 0.21
O2 Saturation: 95.6 %
Patient temperature: 37
pCO2 arterial: 43 mmHg (ref 32.0–48.0)
pH, Arterial: 7.43 (ref 7.350–7.450)
pO2, Arterial: 77 mmHg — ABNORMAL LOW (ref 83.0–108.0)

## 2019-07-16 ENCOUNTER — Encounter: Payer: Self-pay | Admitting: Emergency Medicine

## 2019-07-16 ENCOUNTER — Emergency Department: Payer: Medicare Other

## 2019-07-16 ENCOUNTER — Other Ambulatory Visit: Payer: Self-pay

## 2019-07-16 ENCOUNTER — Emergency Department
Admission: EM | Admit: 2019-07-16 | Discharge: 2019-07-16 | Disposition: A | Discharge status: Home / Self Care | Payer: Medicare Other | Attending: Emergency Medicine | Admitting: Emergency Medicine

## 2019-07-16 DIAGNOSIS — J449 Chronic obstructive pulmonary disease, unspecified: Secondary | ICD-10-CM | POA: Insufficient documentation

## 2019-07-16 DIAGNOSIS — I5032 Chronic diastolic (congestive) heart failure: Secondary | ICD-10-CM | POA: Diagnosis not present

## 2019-07-16 DIAGNOSIS — Z794 Long term (current) use of insulin: Secondary | ICD-10-CM | POA: Insufficient documentation

## 2019-07-16 DIAGNOSIS — I2510 Atherosclerotic heart disease of native coronary artery without angina pectoris: Secondary | ICD-10-CM | POA: Insufficient documentation

## 2019-07-16 DIAGNOSIS — Z87891 Personal history of nicotine dependence: Secondary | ICD-10-CM | POA: Insufficient documentation

## 2019-07-16 DIAGNOSIS — E039 Hypothyroidism, unspecified: Secondary | ICD-10-CM | POA: Diagnosis not present

## 2019-07-16 DIAGNOSIS — Z7901 Long term (current) use of anticoagulants: Secondary | ICD-10-CM | POA: Diagnosis not present

## 2019-07-16 DIAGNOSIS — E119 Type 2 diabetes mellitus without complications: Secondary | ICD-10-CM | POA: Diagnosis not present

## 2019-07-16 DIAGNOSIS — I110 Hypertensive heart disease with heart failure: Secondary | ICD-10-CM | POA: Diagnosis not present

## 2019-07-16 DIAGNOSIS — W19XXXA Unspecified fall, initial encounter: Secondary | ICD-10-CM

## 2019-07-16 DIAGNOSIS — R519 Headache, unspecified: Secondary | ICD-10-CM | POA: Diagnosis present

## 2019-07-16 HISTORY — DX: Gastrointestinal hemorrhage, unspecified: K92.2

## 2019-07-16 LAB — COMPREHENSIVE METABOLIC PANEL
ALT: 30 U/L (ref 0–44)
AST: 38 U/L (ref 15–41)
Albumin: 3.6 g/dL (ref 3.5–5.0)
Alkaline Phosphatase: 94 U/L (ref 38–126)
Anion gap: 11 (ref 5–15)
BUN: 11 mg/dL (ref 8–23)
CO2: 26 mmol/L (ref 22–32)
Calcium: 8.7 mg/dL — ABNORMAL LOW (ref 8.9–10.3)
Chloride: 99 mmol/L (ref 98–111)
Creatinine, Ser: 1.13 mg/dL (ref 0.61–1.24)
GFR calc Af Amer: >60 mL/min (ref >60)
GFR calc non Af Amer: >60 mL/min (ref >60)
Glucose, Bld: 102 mg/dL — ABNORMAL HIGH (ref 70–99)
Potassium: 3.9 mmol/L (ref 3.5–5.1)
Sodium: 136 mmol/L (ref 135–145)
Total Bilirubin: 1 mg/dL (ref 0.3–1.2)
Total Protein: 7.6 g/dL (ref 6.5–8.1)

## 2019-07-16 LAB — CBC WITH DIFFERENTIAL/PLATELET
Abs Immature Granulocytes: 0.04 10*3/uL (ref 0.00–0.07)
Basophils Absolute: 0.1 10*3/uL (ref 0.0–0.1)
Basophils Relative: 1 %
Eosinophils Absolute: 0.4 10*3/uL (ref 0.0–0.5)
Eosinophils Relative: 6 %
HCT: 29 % — ABNORMAL LOW (ref 39.0–52.0)
Hemoglobin: 8.9 g/dL — ABNORMAL LOW (ref 13.0–17.0)
Immature Granulocytes: 1 %
Lymphocytes Relative: 13 %
Lymphs Abs: 1 10*3/uL (ref 0.7–4.0)
MCH: 25.2 pg — ABNORMAL LOW (ref 26.0–34.0)
MCHC: 30.7 g/dL (ref 30.0–36.0)
MCV: 82.2 fL (ref 80.0–100.0)
Monocytes Absolute: 0.6 10*3/uL (ref 0.1–1.0)
Monocytes Relative: 8 %
Neutro Abs: 5.4 10*3/uL (ref 1.7–7.7)
Neutrophils Relative %: 71 %
Platelets: 195 10*3/uL (ref 150–400)
RBC: 3.53 MIL/uL — ABNORMAL LOW (ref 4.22–5.81)
RDW: 26.1 % — ABNORMAL HIGH (ref 11.5–15.5)
WBC: 7.6 10*3/uL (ref 4.0–10.5)
nRBC: 0 % (ref 0.0–0.2)

## 2019-07-16 NOTE — Discharge Instructions (Addendum)
You have been seen in the Emergency Department (ED) today for a fall.  Your work up does not show any concerning injuries.  Please take over-the-counter Tylenol as needed for your pain (unless you have an allergy or your doctor as told you not to take it).  Please follow up with your doctor regarding today's Emergency Department (ED) visit and your recent fall.    Return to the ED if you have any headache, confusion, slurred speech, weakness/numbness of any arm or leg, or any increased pain.

## 2019-07-16 NOTE — ED Provider Notes (Signed)
[facility] Emergency Department Provider Note  ____________________________________________   First MD Initiated Contact with Patient 07/16/19 2302     (approximate)  I have reviewed the triage vital signs and the nursing notes.   HISTORY  Chief Complaint Fall and Headache  Level 5 caveat:  History limited by dementia.  Also, the patient speaks Urdu.  His son is at bedside and provides most of the history and interpretive assistance.  HPI Derek Shelton is a 78 y.o. male with medical history as listed below which notably includes dementia as well as anticoagulation on Eliquis and he was recently evaluated and treated for a GI bleed but started back on Eliquis once the GI bleed resolved.  He presents tonight  for evaluation after a fall.  He is frequently unsteady on his feet according to the son and tonight he became over balance and fell backwards and struck the back of his head.  He had a mild headache afterwards and given that he is on Eliquis the family was concerned and wanted to make sure he was okay.  The patient says he feels "okay".  He denies any pain at this time.  He has some chronic shortness of breath due to his heart failure but his son says that it seems to be getting better.  They have been working with his doctor to adjust his diuretics along with the anticoagulation recently.  His shortness of breath is worse with exertion but right now it is okay and it is not an issue for him at rest.  The patient denies current headache and neck pain.  He denies chest pain, nausea, vomiting, and abdominal pain.  He has no pain in his arms or his legs.  The onset of the episode was acute and relatively mild but given the anticoagulation he warranted evaluation.        Past Medical History:  Diagnosis Date  . Anemia   . Asthma   . CHF (congestive heart failure) ([place])   . COPD (chronic obstructive pulmonary disease) ([place])   . Coronary artery disease   .  Dementia ([place])    Per son's report  . Diabetes mellitus without complication ([place])   . Dysrhythmia    Atrial Fibrillation  . GERD (gastroesophageal reflux disease)   . GI bleed   . Hyperlipidemia   . Hypertension   . Hypothyroidism   . Shortness of breath dyspnea   . Sleep apnea   . Stroke ([place])   . Thyroid disease     Patient Active Problem List   Diagnosis Date Noted  . Dementia without behavioral disturbance ([place])   . Symptomatic anemia 07/06/2019  . BPH (benign prostatic hyperplasia) 07/18/2018  . Degeneration of lumbar intervertebral disc 07/17/2018  . Osteoarthritis of knee 07/17/2018  . Spinal stenosis of lumbar region 07/17/2018  . Acute CVA (cerebrovascular accident) ([place]) 12/02/2017  . Acute ischemic stroke ([place]) 12/02/2017  . Acute metabolic encephalopathy due to hypoglycemia 11/29/2017  . Essential hypertension 11/29/2017  . Syncope and collapse 11/28/2017  . Atrial fibrillation, chronic ([place]) 05/22/2017  . Chest pain 02/21/2017  . AKI (acute kidney injury) ([place]) 02/21/2017  . SOB (shortness of breath) 02/08/2017  . Atrial fibrillation with RVR ([place]) 02/08/2017  . GI bleeding 08/06/2014  . Anemia 08/06/2014  . Bradycardia 08/06/2014  . Atrial fibrillation ([place]) 08/06/2014  . Hyponatremia 08/06/2014  . Chronic diastolic heart failure ([place]) 08/06/2014  . OSA (obstructive sleep apnea) 08/06/2014  . Memory loss  or impairment 05/29/2014  . Type 2 diabetes mellitus, without long-term current use of insulin ([place]) 08/11/2010  . 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 Chronic rhinitis 08/11/2010  . Coronary artery disease 08/11/2010  . Esophageal reflux 08/11/2010  . Personal history of arthritis 08/11/2010  . Tear film insufficiency 06/07/2010  . 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 Keratoconjunctivitis sicca ([place]) 03/10/2010  . Lens replaced 03/10/2010  . Pterygium 03/10/2010    Past Surgical History:  Procedure Laterality Date  . CARDIAC  CATHETERIZATION    . COLONOSCOPY N/A 08/10/2014   Procedure: COLONOSCOPY;  Surgeon: Manya Silvas, MD;  Location: [facility] ENDOSCOPY;  Service: Endoscopy;  Laterality: N/A;  . COLONOSCOPY WITH PROPOFOL N/A 04/05/2016   Procedure: COLONOSCOPY WITH PROPOFOL;  Surgeon: Lollie Sails, MD;  Location: [facility] ENDOSCOPY;  Service: Endoscopy;  Laterality: N/A;  . COLONOSCOPY WITH PROPOFOL N/A 07/10/2019   Procedure: COLONOSCOPY WITH PROPOFOL;  Surgeon: Lucilla Lame, MD;  Location: [facility] ENDOSCOPY;  Service: Endoscopy;  Laterality: N/A;  . ESOPHAGOGASTRODUODENOSCOPY N/A 08/08/2014   Procedure: ESOPHAGOGASTRODUODENOSCOPY (EGD);  Surgeon: Manya Silvas, MD;  Location: [facility] ENDOSCOPY;  Service: Endoscopy;  Laterality: N/A;  plan for early afternoon  . ESOPHAGOGASTRODUODENOSCOPY (EGD) WITH PROPOFOL N/A 07/10/2019   Procedure: ESOPHAGOGASTRODUODENOSCOPY (EGD) WITH PROPOFOL;  Surgeon: Lucilla Lame, MD;  Location: [facility] ENDOSCOPY;  Service: Endoscopy;  Laterality: N/A;  . EYE SURGERY    . GIVENS CAPSULE STUDY N/A 08/12/2014   Procedure: GIVENS CAPSULE STUDY;  Surgeon: Manya Silvas, MD;  Location: [facility] ENDOSCOPY;  Service: Endoscopy;  Laterality: N/A;  . rectal fistula N/A     Prior to Admission medications   Medication Sig Start Date End Date Taking? Authorizing Provider  apixaban (ELIQUIS) 2.5 MG TABS tablet Take 1 tablet (2.5 mg total) by mouth 2 (two) times daily. 07/10/19   Lorella Nimrod, MD  atorvastatin (LIPITOR) 40 MG tablet Take 1 tablet (40 mg total) by mouth daily at 6 PM. 12/03/17   Demetrios Loll, MD  budesonide-formoterol [facility]) 160-4.5 MCG/ACT inhaler Inhale 2 puffs into the lungs 2 (two) times daily.    [provider]  digoxin (LANOXIN) 0.125 MG tablet Take 1 tablet (0.125 mg total) by mouth daily. Patient taking differently: Take 0.125 mg by mouth. TAKE ONCE A DAY EVERY OTHER DAY. 12/08/17   Hillary Bow, MD  donepezil (ARICEPT) 5 MG tablet Take 5 mg by mouth at bedtime.     [provider]  DULoxetine (CYMBALTA) 60 MG capsule Take 60 mg by mouth daily.    [provider]  ferrous sulfate 325 (65 FE) MG EC tablet Take 1 tablet (325 mg total) by mouth 2 (two) times daily. 07/11/19 07/10/20  Lorella Nimrod, MD  finasteride (PROSCAR) 5 MG tablet Take 1 tablet (5 mg total) by mouth daily. 07/18/18   Stoioff, Ronda Fairly, MD  furosemide (LASIX) 20 MG tablet Take 40 mg by mouth daily.     [provider]  insulin degludec (TRESIBA FLEXTOUCH) 100 UNIT/ML SOPN FlexTouch Pen Inject 30 Units into the skin at bedtime.     [provider]  ipratropium-albuterol (DUONEB) 0.5-2.5 (3) MG/3ML SOLN Inhale 3 mLs into the lungs every 6 (six) hours as needed for shortness of breath. 01/22/17   [provider]  latanoprost (XALATAN) 0.005 % ophthalmic solution latanoprost 0.005 % eye drops  INSTILL ONE DROP TO BOTH EYES AT BEDTIME.    [provider]  levothyroxine (SYNTHROID, LEVOTHROID) 112 MCG tablet Take 112 mcg by mouth daily  before breakfast.     [provider]  Melatonin 3 MG TABS Take 3 mg by mouth at bedtime as needed (sleep).     [provider]  metoprolol tartrate (LOPRESSOR) 50 MG tablet Take 50 mg by mouth 2 (two) times daily.     [provider]  montelukast (SINGULAIR) 10 MG tablet Take 10 mg by mouth daily.    [provider]  omega-3 acid ethyl esters (LOVAZA) 1 G capsule Take 2 g by mouth 2 (two) times daily.     [provider]  pantoprazole (PROTONIX) 40 MG tablet Take 40 mg by mouth daily. 06/04/19   [provider]  potassium chloride SA (K-DUR,KLOR-CON) 20 MEQ tablet Take 10 mEq by mouth 3 (three) times a week. Take 1/2 tablets (10MEQ) by mouth twice daily for 3 days as directed Mon., Wed., Fri. 12/01/17   [provider]  sildenafil (REVATIO) 20 MG tablet Take 20 mg by mouth 3 (three) times daily. 05/09/19   [provider]  tamsulosin (FLOMAX) 0.4 MG  CAPS capsule Take 0.4 mg by mouth daily. 05/07/19   [provider]    Allergies Aricept Reather Littler hcl], Catapres [clonidine hcl], Coreg [carvedilol], and Penicillins  Family History  Problem Relation Age of Onset  . Diabetes Mother     Social History Social History   Tobacco Use  . Smoking status: Former Smoker    Quit date: 2000    Years since quitting: 21.4  . Smokeless tobacco: Former Systems developer    Types: Secondary school teacher  . Vaping Use: Never used  Substance Use Topics  . Alcohol use: No  . Drug use: No    Review of Systems Constitutional: No fever/chills Eyes: No visual changes. ENT: No sore throat. Cardiovascular: Denies chest pain. Respiratory: Some dyspnea associated with heart failure, chronic, worse with exertion. Gastrointestinal: No abdominal pain.  No nausea, no vomiting.  No diarrhea.  No constipation. Genitourinary: Negative for dysuria. Musculoskeletal: Negative for neck pain.  Negative for back pain. Integumentary: Negative for rash. Neurological: Initially headache after fall, now negative for headaches, focal weakness or numbness.   ____________________________________________   PHYSICAL EXAM:  VITAL SIGNS: ED Triage Vitals  Enc Vitals Group     BP 07/16/19 2040 (!) 163/83     Pulse Rate 07/16/19 2040 63     Resp 07/16/19 2040 18     Temp 07/16/19 2040 98.4 F (36.9 C)     Temp Source 07/16/19 2040 Oral     SpO2 07/16/19 2040 100 %     Weight 07/16/19 2041 94.3 kg (208 lb)     Height 07/16/19 2041 1.626 m (5\' 4" )     Head Circumference --      Peak Flow --      Pain Score 07/16/19 2041 5     Pain Loc --      Pain Edu? --      Excl. in [place]? --     Level 5 caveat:  History limited by dementia.  Also, the patient speaks Urdu.  His son is at bedside and provides most of the history and interpretive assistance.  Constitutional: Alert and oriented to person and place and he knows his son. Eyes: Conjunctivae are normal.  Head:  Atraumatic.  I see no evidence of hematoma or contusion on the back of his head or anywhere else along his scalp. Mouth/Throat: Patient is wearing a mask. Neck: No stridor.  No meningeal signs.  Cardiovascular: Normal rate, regular rhythm. Good peripheral circulation. Grossly normal heart sounds. Respiratory: Normal respiratory effort.  No retractions. Gastrointestinal: Soft and nontender. No distention.  Musculoskeletal: 1+ pitting edema and his legs which the son says is normal.  No gross deformities of extremities.  No tenderness to palpation of the cervical spine and no pain/tenderness with flexion and extension of the head and neck nor with rotation side to side. Neurologic:  Normal speech and language. No gross focal neurologic deficits are appreciated.  Skin:  Skin is warm, dry and intact. Psychiatric: Mood and affect are normal. Speech and behavior are normal.  ____________________________________________   LABS (all labs ordered are listed, but only abnormal results are displayed)  Labs Reviewed  CBC WITH DIFFERENTIAL/PLATELET - Abnormal; Notable for the following components:      Result Value   RBC 3.53 (*)    Hemoglobin 8.9 (*)    HCT 29.0 (*)    MCH 25.2 (*)    RDW 26.1 (*)    All other components within normal limits  COMPREHENSIVE METABOLIC PANEL - Abnormal; Notable for the following components:   Glucose, Bld 102 (*)    Calcium 8.7 (*)    All other components within normal limits   ____________________________________________  EKG  No indication for EKG ____________________________________________  RADIOLOGY I, Hinda Kehr, personally viewed and evaluated these images (plain radiographs) as part of my medical decision making, as well as reviewing the written report by the radiologist.  ED MD interpretation: No acute abnormalities identified on head CT.  Official radiology report(s): CT Head Wo Contrast  Result Date: 07/16/2019 CLINICAL DATA:  Head trauma  headache EXAM: CT HEAD WITHOUT CONTRAST TECHNIQUE: Contiguous axial images were obtained from the base of the skull through the vertex without intravenous contrast. COMPARISON:  CT brain 12/03/2018, MRI 12/06/2017, CT 08/05/2004 FINDINGS: Brain: No acute territorial infarction, hemorrhage, or new intracranial mass. Left middle cranial fossa meningioma again noted. Atrophy and mild chronic small vessel ischemic change of the white matter. Stable ventricle size. Vascular: No hyperdense vessels.  Carotid vascular calcification Skull: No fracture. Heterogeneous lucency at the left central skull base, likely due to bony remodeling from meningioma. Sinuses/Orbits: Opacified right maxillary sinus. Mucosal thickening in the ethmoid sinuses. Other: None IMPRESSION: 1. No CT evidence for acute intracranial abnormality. Atrophy and chronic small vessel ischemic change of the white matter 2. 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 Electronically Signed   By: Donavan Foil M.D.   On: 07/16/2019 21:31    ____________________________________________   PROCEDURES   Procedure(s) performed (including Critical Care):  Procedures   ____________________________________________   INITIAL IMPRESSION / MDM / ASSESSMENT AND PLAN / ED COURSE  As part of my medical decision making, I reviewed the following data within the [place] History obtained from family, Interpreter needed, Labs reviewed , Old chart reviewed and Notes from prior ED visits   Differential diagnosis includes, but is not limited to, intracranial bleeding, skull fracture, cervical spine injury, metabolic or electrolyte abnormality, acute infection.  The patient's physical exam is reassuring.  His vital signs are stable except for some mild hypertension.  It sounds like his peripheral edema and CHF issues are chronic and at their baseline.  CBC is essentially stable with a  hemoglobin of right around 9 and his comprehensive metabolic panel is within normal limits.  CT scan demonstrates no acute abnormalities in his head such as intracranial bleeding and his  meningioma, of which the patient and his son is very aware and for which I found prior imaging dating back years, is stable and does not seem to be symptomatic or contributing to the issue tonight.   NEXUS C-spine Criteria   C-spine imaging is recommended if yes to ANY of the following (Mneumonic is "NSAID"):   No.  N - neurologic (focal) deficit present No.   S - spinal midline tenderness present No.  A - altered level of consciousness present No.    I  - intoxication present No.   D - distracting injury present   Based on my evaluation of the patient, including application of this decision instrument, cervical spine imaging to evaluate for injury is not indicated at this time.  The patient and son are comfortable with the plan for discharge and outpatient follow-up given no emergent findings.  I gave my usual and customary return precautions.    ____________________________________________  FINAL CLINICAL IMPRESSION(S) / ED DIAGNOSES  Final diagnoses:  Fall, initial encounter     MEDICATIONS GIVEN DURING THIS VISIT:  Medications - No data to display   ED Discharge Orders    None      *Please note:  Derek Shelton was evaluated in Emergency Department on 07/16/2019 for the symptoms described in the history of present illness. He was evaluated in the context of the global COVID-19 pandemic, which necessitated consideration that the patient might be at risk for infection with the SARS-CoV-2 virus that causes COVID-19. Institutional protocols and algorithms that pertain to the evaluation of patients at risk for COVID-19 are in a state of rapid change based on information released by regulatory bodies including the CDC and federal and state organizations. These policies and algorithms were  followed during the patient's care in the ED.  Some ED evaluations and interventions may be delayed as a result of limited staffing during and after the pandemic.*  Note:  This document was prepared using Dragon voice recognition software and may include unintentional dictation errors.   Hinda Kehr, MD 07/16/19 [phone]

## 2019-07-16 NOTE — ED Triage Notes (Signed)
Pt presents to ED with c/o headache post mechanical fall. Pt son states pt went to sit down and missed the chair and hit the back of his head on hard surface floor. Pt does take blood thinners and family reports that pt reports his eyes feel heavy and some clear nasal drainage. Pt alert during triage and answering questions appropriately.

## 2019-07-22 ENCOUNTER — Ambulatory Visit: Payer: Medicare Other | Admitting: Urology

## 2019-08-06 ENCOUNTER — Emergency Department: Payer: Medicare Other

## 2019-08-06 ENCOUNTER — Other Ambulatory Visit: Payer: Self-pay

## 2019-08-06 ENCOUNTER — Inpatient Hospital Stay
Admission: EM | Admit: 2019-08-06 | Discharge: 2019-08-11 | DRG: 291 | Disposition: A | Discharge status: Home Health | Payer: Medicare Other | Attending: Internal Medicine | Admitting: Internal Medicine

## 2019-08-06 DIAGNOSIS — Z87891 Personal history of nicotine dependence: Secondary | ICD-10-CM

## 2019-08-06 DIAGNOSIS — I2510 Atherosclerotic heart disease of native coronary artery without angina pectoris: Secondary | ICD-10-CM | POA: Diagnosis present

## 2019-08-06 DIAGNOSIS — I110 Hypertensive heart disease with heart failure: Principal | ICD-10-CM | POA: Diagnosis present

## 2019-08-06 DIAGNOSIS — Z888 Allergy status to other drugs, medicaments and biological substances status: Secondary | ICD-10-CM

## 2019-08-06 DIAGNOSIS — E039 Hypothyroidism, unspecified: Secondary | ICD-10-CM | POA: Diagnosis present

## 2019-08-06 DIAGNOSIS — J9601 Acute respiratory failure with hypoxia: Secondary | ICD-10-CM

## 2019-08-06 DIAGNOSIS — I4819 Other persistent atrial fibrillation: Secondary | ICD-10-CM | POA: Diagnosis present

## 2019-08-06 DIAGNOSIS — J181 Lobar pneumonia, unspecified organism: Secondary | ICD-10-CM | POA: Diagnosis not present

## 2019-08-06 DIAGNOSIS — Z7951 Long term (current) use of inhaled steroids: Secondary | ICD-10-CM

## 2019-08-06 DIAGNOSIS — I071 Rheumatic tricuspid insufficiency: Secondary | ICD-10-CM

## 2019-08-06 DIAGNOSIS — Z79899 Other long term (current) drug therapy: Secondary | ICD-10-CM

## 2019-08-06 DIAGNOSIS — G4733 Obstructive sleep apnea (adult) (pediatric): Secondary | ICD-10-CM | POA: Diagnosis present

## 2019-08-06 DIAGNOSIS — Z880 Allergy status to penicillin: Secondary | ICD-10-CM

## 2019-08-06 DIAGNOSIS — Z794 Long term (current) use of insulin: Secondary | ICD-10-CM

## 2019-08-06 DIAGNOSIS — F0390 Unspecified dementia without behavioral disturbance: Secondary | ICD-10-CM | POA: Diagnosis present

## 2019-08-06 DIAGNOSIS — K219 Gastro-esophageal reflux disease without esophagitis: Secondary | ICD-10-CM | POA: Diagnosis present

## 2019-08-06 DIAGNOSIS — N400 Enlarged prostate without lower urinary tract symptoms: Secondary | ICD-10-CM | POA: Diagnosis present

## 2019-08-06 DIAGNOSIS — I482 Chronic atrial fibrillation, unspecified: Secondary | ICD-10-CM | POA: Diagnosis present

## 2019-08-06 DIAGNOSIS — J189 Pneumonia, unspecified organism: Secondary | ICD-10-CM | POA: Diagnosis present

## 2019-08-06 DIAGNOSIS — I5033 Acute on chronic diastolic (congestive) heart failure: Secondary | ICD-10-CM | POA: Diagnosis present

## 2019-08-06 DIAGNOSIS — Z9119 Patient's noncompliance with other medical treatment and regimen: Secondary | ICD-10-CM

## 2019-08-06 DIAGNOSIS — E119 Type 2 diabetes mellitus without complications: Secondary | ICD-10-CM | POA: Diagnosis present

## 2019-08-06 DIAGNOSIS — K922 Gastrointestinal hemorrhage, unspecified: Secondary | ICD-10-CM | POA: Diagnosis present

## 2019-08-06 DIAGNOSIS — I2721 Secondary pulmonary arterial hypertension: Secondary | ICD-10-CM | POA: Diagnosis present

## 2019-08-06 DIAGNOSIS — E785 Hyperlipidemia, unspecified: Secondary | ICD-10-CM | POA: Diagnosis present

## 2019-08-06 DIAGNOSIS — J310 Chronic rhinitis: Secondary | ICD-10-CM | POA: Diagnosis present

## 2019-08-06 DIAGNOSIS — Z20822 Contact with and (suspected) exposure to covid-19: Secondary | ICD-10-CM | POA: Diagnosis present

## 2019-08-06 DIAGNOSIS — I361 Nonrheumatic tricuspid (valve) insufficiency: Secondary | ICD-10-CM | POA: Diagnosis present

## 2019-08-06 DIAGNOSIS — I495 Sick sinus syndrome: Secondary | ICD-10-CM | POA: Diagnosis present

## 2019-08-06 DIAGNOSIS — F329 Major depressive disorder, single episode, unspecified: Secondary | ICD-10-CM | POA: Diagnosis present

## 2019-08-06 DIAGNOSIS — Z8719 Personal history of other diseases of the digestive system: Secondary | ICD-10-CM

## 2019-08-06 DIAGNOSIS — Z7901 Long term (current) use of anticoagulants: Secondary | ICD-10-CM | POA: Diagnosis not present

## 2019-08-06 DIAGNOSIS — Z833 Family history of diabetes mellitus: Secondary | ICD-10-CM

## 2019-08-06 DIAGNOSIS — Y95 Nosocomial condition: Secondary | ICD-10-CM | POA: Diagnosis present

## 2019-08-06 DIAGNOSIS — Z79890 Hormone replacement therapy (postmenopausal): Secondary | ICD-10-CM

## 2019-08-06 DIAGNOSIS — Z87898 Personal history of other specified conditions: Secondary | ICD-10-CM

## 2019-08-06 DIAGNOSIS — E876 Hypokalemia: Secondary | ICD-10-CM | POA: Diagnosis present

## 2019-08-06 DIAGNOSIS — Z8673 Personal history of transient ischemic attack (TIA), and cerebral infarction without residual deficits: Secondary | ICD-10-CM

## 2019-08-06 DIAGNOSIS — I509 Heart failure, unspecified: Secondary | ICD-10-CM

## 2019-08-06 DIAGNOSIS — J440 Chronic obstructive pulmonary disease with acute lower respiratory infection: Secondary | ICD-10-CM | POA: Diagnosis present

## 2019-08-06 LAB — BRAIN NATRIURETIC PEPTIDE: B Natriuretic Peptide: 260.4 pg/mL — ABNORMAL HIGH (ref 0.0–100.0)

## 2019-08-06 LAB — CBC WITH DIFFERENTIAL/PLATELET
Abs Immature Granulocytes: 0.08 10*3/uL — ABNORMAL HIGH (ref 0.00–0.07)
Basophils Absolute: 0.1 10*3/uL (ref 0.0–0.1)
Basophils Relative: 0 %
Eosinophils Absolute: 0.2 10*3/uL (ref 0.0–0.5)
Eosinophils Relative: 1 %
HCT: 32.6 % — ABNORMAL LOW (ref 39.0–52.0)
Hemoglobin: 10.2 g/dL — ABNORMAL LOW (ref 13.0–17.0)
Immature Granulocytes: 1 %
Lymphocytes Relative: 6 %
Lymphs Abs: 1 10*3/uL (ref 0.7–4.0)
MCH: 26.7 pg (ref 26.0–34.0)
MCHC: 31.3 g/dL (ref 30.0–36.0)
MCV: 85.3 fL (ref 80.0–100.0)
Monocytes Absolute: 1 10*3/uL (ref 0.1–1.0)
Monocytes Relative: 7 %
Neutro Abs: 13.5 10*3/uL — ABNORMAL HIGH (ref 1.7–7.7)
Neutrophils Relative %: 85 %
Platelets: 194 10*3/uL (ref 150–400)
RBC: 3.82 MIL/uL — ABNORMAL LOW (ref 4.22–5.81)
RDW: 26.5 % — ABNORMAL HIGH (ref 11.5–15.5)
WBC: 15.9 10*3/uL — ABNORMAL HIGH (ref 4.0–10.5)
nRBC: 0 % (ref 0.0–0.2)

## 2019-08-06 LAB — BLOOD GAS, VENOUS
Acid-Base Excess: 5.8 mmol/L — ABNORMAL HIGH (ref 0.0–2.0)
Bicarbonate: 29.8 mmol/L — ABNORMAL HIGH (ref 20.0–28.0)
Delivery systems: POSITIVE
FIO2: 0.32
O2 Saturation: 96 %
Patient temperature: 37
RATE: 14 resp/min
pCO2, Ven: 40 mmHg — ABNORMAL LOW (ref 44.0–60.0)
pH, Ven: 7.48 — ABNORMAL HIGH (ref 7.250–7.430)
pO2, Ven: 76 mmHg — ABNORMAL HIGH (ref 32.0–45.0)

## 2019-08-06 LAB — COMPREHENSIVE METABOLIC PANEL
ALT: 19 U/L (ref 0–44)
AST: 45 U/L — ABNORMAL HIGH (ref 15–41)
Albumin: 4.4 g/dL (ref 3.5–5.0)
Alkaline Phosphatase: 99 U/L (ref 38–126)
Anion gap: 11 (ref 5–15)
BUN: 16 mg/dL (ref 8–23)
CO2: 28 mmol/L (ref 22–32)
Calcium: 9.1 mg/dL (ref 8.9–10.3)
Chloride: 96 mmol/L — ABNORMAL LOW (ref 98–111)
Creatinine, Ser: 1 mg/dL (ref 0.61–1.24)
GFR calc Af Amer: >60 mL/min (ref >60)
GFR calc non Af Amer: >60 mL/min (ref >60)
Glucose, Bld: 149 mg/dL — ABNORMAL HIGH (ref 70–99)
Potassium: 4.4 mmol/L (ref 3.5–5.1)
Sodium: 135 mmol/L (ref 135–145)
Total Bilirubin: 2 mg/dL — ABNORMAL HIGH (ref 0.3–1.2)
Total Protein: 8.8 g/dL — ABNORMAL HIGH (ref 6.5–8.1)

## 2019-08-06 LAB — TROPONIN I (HIGH SENSITIVITY)
Troponin I (High Sensitivity): 18 ng/L — ABNORMAL HIGH (ref <18)
Troponin I (High Sensitivity): 26 ng/L — ABNORMAL HIGH (ref <18)

## 2019-08-06 LAB — SARS CORONAVIRUS 2 BY RT PCR (HOSPITAL ORDER, PERFORMED IN ~~LOC~~ HOSPITAL LAB): SARS Coronavirus 2: NEGATIVE

## 2019-08-06 LAB — HIV ANTIBODY (ROUTINE TESTING W REFLEX): HIV Screen 4th Generation wRfx: NONREACTIVE

## 2019-08-06 MED ORDER — ONDANSETRON HCL 4 MG/2ML IJ SOLN: 4.0000 mg | Freq: Four times a day (QID) | INTRAMUSCULAR | Status: DC | PRN

## 2019-08-06 MED ORDER — METHYLPREDNISOLONE SODIUM SUCC 125 MG IJ SOLR
INTRAMUSCULAR | Status: AC
  Administered 2019-08-06: 60 mg via INTRAVENOUS
  Filled 2019-08-06: qty 2

## 2019-08-06 MED ORDER — SODIUM CHLORIDE 0.9 % IV SOLN
1.0000 g | Freq: Once | INTRAVENOUS | Status: AC
  Administered 2019-08-06: 1 g via INTRAVENOUS
  Filled 2019-08-06: qty 10

## 2019-08-06 MED ORDER — IPRATROPIUM-ALBUTEROL 0.5-2.5 (3) MG/3ML IN SOLN
RESPIRATORY_TRACT | Status: AC
  Administered 2019-08-06: 3 mL via RESPIRATORY_TRACT
  Filled 2019-08-06: qty 6

## 2019-08-06 MED ORDER — SODIUM CHLORIDE 0.9 % IV SOLN
2.0000 g | INTRAVENOUS | Status: AC
  Administered 2019-08-07 – 2019-08-11 (×5): 2 g via INTRAVENOUS
  Filled 2019-08-06: qty 2
  Filled 2019-08-06 (×3): qty 20
  Filled 2019-08-06: qty 2

## 2019-08-06 MED ORDER — SODIUM CHLORIDE 0.9 % IV SOLN
500.0000 mg | Freq: Once | INTRAVENOUS | Status: AC
  Administered 2019-08-06: 500 mg via INTRAVENOUS
  Filled 2019-08-06: qty 500

## 2019-08-06 MED ORDER — SODIUM CHLORIDE 0.9 % IV SOLN
250.0000 mL | INTRAVENOUS | Status: DC | PRN
  Administered 2019-08-07 – 2019-08-09 (×3): 250 mL via INTRAVENOUS

## 2019-08-06 MED ORDER — ACETAMINOPHEN 325 MG PO TABS
650.0000 mg | ORAL_TABLET | ORAL | Status: DC | PRN
  Administered 2019-08-09: 650 mg via ORAL
  Filled 2019-08-06: qty 2

## 2019-08-06 MED ORDER — METHYLPREDNISOLONE SODIUM SUCC 125 MG IJ SOLR: 60.0000 mg | Freq: Once | INTRAMUSCULAR | Status: AC

## 2019-08-06 MED ORDER — APIXABAN 2.5 MG PO TABS: 2.5000 mg | ORAL_TABLET | Freq: Two times a day (BID) | ORAL | Status: DC

## 2019-08-06 MED ORDER — LABETALOL HCL 5 MG/ML IV SOLN
INTRAVENOUS | Status: AC
  Filled 2019-08-06: qty 4

## 2019-08-06 MED ORDER — APIXABAN 2.5 MG PO TABS
2.5000 mg | ORAL_TABLET | Freq: Two times a day (BID) | ORAL | Status: DC
  Administered 2019-08-06 – 2019-08-11 (×10): 2.5 mg via ORAL
  Filled 2019-08-06 (×12): qty 1

## 2019-08-06 MED ORDER — SODIUM CHLORIDE 0.9% FLUSH: 3.0000 mL | INTRAVENOUS | Status: DC | PRN

## 2019-08-06 MED ORDER — IPRATROPIUM-ALBUTEROL 0.5-2.5 (3) MG/3ML IN SOLN
3.0000 mL | Freq: Once | RESPIRATORY_TRACT | Status: AC
  Administered 2019-08-06: 3 mL via RESPIRATORY_TRACT

## 2019-08-06 MED ORDER — IOHEXOL 350 MG/ML SOLN
75.0000 mL | Freq: Once | INTRAVENOUS | Status: AC | PRN
  Administered 2019-08-06: 75 mL via INTRAVENOUS

## 2019-08-06 MED ORDER — FUROSEMIDE 10 MG/ML IJ SOLN
40.0000 mg | Freq: Two times a day (BID) | INTRAMUSCULAR | Status: DC
  Administered 2019-08-06 – 2019-08-09 (×7): 40 mg via INTRAVENOUS
  Filled 2019-08-06 (×8): qty 4

## 2019-08-06 MED ORDER — SODIUM CHLORIDE 0.9 % IV SOLN
500.0000 mg | INTRAVENOUS | Status: DC
  Administered 2019-08-07 – 2019-08-09 (×3): 500 mg via INTRAVENOUS
  Filled 2019-08-06 (×3): qty 500

## 2019-08-06 MED ORDER — IPRATROPIUM-ALBUTEROL 0.5-2.5 (3) MG/3ML IN SOLN: 3.0000 mL | Freq: Once | RESPIRATORY_TRACT | Status: AC

## 2019-08-06 MED ORDER — FUROSEMIDE 10 MG/ML IJ SOLN
40.0000 mg | Freq: Once | INTRAMUSCULAR | Status: AC
  Administered 2019-08-06: 40 mg via INTRAVENOUS
  Filled 2019-08-06: qty 4

## 2019-08-06 MED ORDER — IPRATROPIUM-ALBUTEROL 0.5-2.5 (3) MG/3ML IN SOLN
3.0000 mL | Freq: Once | RESPIRATORY_TRACT | Status: AC
  Administered 2019-08-06: 3 mL via RESPIRATORY_TRACT
  Filled 2019-08-06: qty 3

## 2019-08-06 MED ORDER — SODIUM CHLORIDE 0.9% FLUSH
3.0000 mL | Freq: Two times a day (BID) | INTRAVENOUS | Status: DC
  Administered 2019-08-06 – 2019-08-11 (×10): 3 mL via INTRAVENOUS

## 2019-08-06 NOTE — Progress Notes (Signed)
Patient transported to CT and back on Bipap. No complications, patient tolerated well.

## 2019-08-06 NOTE — ED Notes (Signed)
Pt dinner tray given. 

## 2019-08-06 NOTE — ED Notes (Signed)
Pt and son concerned about not eating any food. Explained this to Dr. Mal [name], MD. Determined that we could trial him off BiPap. Respiratory notified, pt placed on 3L [place].

## 2019-08-06 NOTE — H&P (Signed)
History and Physical    Derek Shelton NTI:[id number] DOB: Jul 13, 1940 DOA: 08/06/2019  PCP: Gladstone Lighter, MD   Patient coming from: Home  I have personally briefly reviewed patient's old medical records in [place]  Chief Complaint: Shortness of breath by 1 day.    HPI: Derek Shelton is a 79 y.o. male with medical history significant for  COPD, diabetes, diastolic CHF EF 86% on 07/29/1948,, moderate to severe tricuspid regurgitation and moderate PAH followed by pulmonology, hypothyroidism, history of CVA, persistent A. fib on low-dose Eliquis due to recent history of GI bleed, and dementia, who presents to the emergency room with dyspnea on exertion that became acutely worse in the past several hours..  Patient was seen by his cardiologist yesterday as a follow-up from recent Holter monitoring that showed mostly sinus bradycardia with mean heart rate 57, lowest heart rate 41.   patient has been having shortness of breath, dyspnea on exertion and lower extremity edema, for the past few months.  Most of the history taken from son at bedside who states that earlier in the morning, patient developed a bout of coughing and progressively became more dyspneic.  Patient has had no chest pain, fever or chills, nausea vomiting.  ED Course: On arrival, T 99.2, BP 163/71, HR 77, RR 24 with O2 sat 91% on room air.  Troponin 18, BNP 260.  Chest x-ray mild vascular congestion.  EKG showed junctional rhythm.  WBC 16,000, Hb 10.2, up from 8.  Mildly elevated LFTs with AST 45, bili 2.  Venous blood gas pH 7.48 PCO2 40.  Patient subsequently had CTA chest negative for acute PE but showing left lower lobe pneumonia.  Review of Systems: Limited due to clinical condition   Past Medical History:  Diagnosis Date  . Anemia   . Asthma   . CHF (congestive heart failure) ([place])   . COPD (chronic obstructive pulmonary disease) ([place])   . Coronary artery disease   . Dementia ([place])    Per son's report  .  Diabetes mellitus without complication ([place])   . Dysrhythmia    Atrial Fibrillation  . GERD (gastroesophageal reflux disease)   . GI bleed   . Hyperlipidemia   . Hypertension   . Hypothyroidism   . Shortness of breath dyspnea   . Sleep apnea   . Stroke ([place])   . Thyroid disease     Past Surgical History:  Procedure Laterality Date  . CARDIAC CATHETERIZATION    . COLONOSCOPY N/A 08/10/2014   Procedure: COLONOSCOPY;  Surgeon: Manya Silvas, MD;  Location: [facility] ENDOSCOPY;  Service: Endoscopy;  Laterality: N/A;  . COLONOSCOPY WITH PROPOFOL N/A 04/05/2016   Procedure: COLONOSCOPY WITH PROPOFOL;  Surgeon: Lollie Sails, MD;  Location: [facility] ENDOSCOPY;  Service: Endoscopy;  Laterality: N/A;  . COLONOSCOPY WITH PROPOFOL N/A 07/10/2019   Procedure: COLONOSCOPY WITH PROPOFOL;  Surgeon: Lucilla Lame, MD;  Location: [facility] ENDOSCOPY;  Service: Endoscopy;  Laterality: N/A;  . ESOPHAGOGASTRODUODENOSCOPY N/A 08/08/2014   Procedure: ESOPHAGOGASTRODUODENOSCOPY (EGD);  Surgeon: Manya Silvas, MD;  Location: [facility] ENDOSCOPY;  Service: Endoscopy;  Laterality: N/A;  plan for early afternoon  . ESOPHAGOGASTRODUODENOSCOPY (EGD) WITH PROPOFOL N/A 07/10/2019   Procedure: ESOPHAGOGASTRODUODENOSCOPY (EGD) WITH PROPOFOL;  Surgeon: Lucilla Lame, MD;  Location: [facility] ENDOSCOPY;  Service: Endoscopy;  Laterality: N/A;  . EYE SURGERY    . GIVENS CAPSULE STUDY N/A 08/12/2014   Procedure: GIVENS CAPSULE STUDY;  Surgeon: Manya Silvas, MD;  Location: [facility] ENDOSCOPY;  Service: Endoscopy;  Laterality: N/A;  . rectal fistula N/A      reports that he quit smoking about 21 years ago. He has quit using smokeless tobacco.  His smokeless tobacco use included chew. He reports that he does not drink alcohol and does not use drugs.  Allergies  Allergen Reactions  . Aricept [Donepezil Hcl] Other (See Comments)    Medication cause significant bradycardia  . Catapres [Clonidine Hcl]   . Coreg [Carvedilol]   . Penicillins       Family History  Problem Relation Age of Onset  . Diabetes Mother       Prior to Admission medications   Medication Sig Start Date End Date Taking? Authorizing Provider  apixaban (ELIQUIS) 2.5 MG TABS tablet Take 1 tablet (2.5 mg total) by mouth 2 (two) times daily. 07/10/19   Lorella Nimrod, MD  atorvastatin (LIPITOR) 40 MG tablet Take 1 tablet (40 mg total) by mouth daily at 6 PM. 12/03/17   Demetrios Loll, MD  budesonide-formoterol [facility]) 160-4.5 MCG/ACT inhaler Inhale 2 puffs into the lungs 2 (two) times daily.    [provider]  digoxin (LANOXIN) 0.125 MG tablet Take 1 tablet (0.125 mg total) by mouth daily. Patient taking differently: Take 0.125 mg by mouth. TAKE ONCE A DAY EVERY OTHER DAY. 12/08/17   Hillary Bow, MD  donepezil (ARICEPT) 5 MG tablet Take 5 mg by mouth at bedtime.    [provider]  DULoxetine (CYMBALTA) 60 MG capsule Take 60 mg by mouth daily.    [provider]  ferrous sulfate 325 (65 FE) MG EC tablet Take 1 tablet (325 mg total) by mouth 2 (two) times daily. 07/11/19 07/10/20  Lorella Nimrod, MD  finasteride (PROSCAR) 5 MG tablet Take 1 tablet (5 mg total) by mouth daily. 07/18/18   Stoioff, Ronda Fairly, MD  furosemide (LASIX) 20 MG tablet Take 40 mg by mouth daily.     [provider]  insulin degludec (TRESIBA FLEXTOUCH) 100 UNIT/ML SOPN FlexTouch Pen Inject 30 Units into the skin at bedtime.     [provider]  ipratropium-albuterol (DUONEB) 0.5-2.5 (3) MG/3ML SOLN Inhale 3 mLs into the lungs every 6 (six) hours as needed for shortness of breath. 01/22/17   [provider]  latanoprost (XALATAN) 0.005 % ophthalmic solution latanoprost 0.005 % eye drops  INSTILL ONE DROP TO BOTH EYES AT BEDTIME.    [provider]  levothyroxine (SYNTHROID, LEVOTHROID) 112 MCG tablet Take 112 mcg by mouth daily before breakfast.     [provider]  Melatonin 3 MG TABS Take 3 mg by mouth at bedtime as needed  (sleep).     [provider]  metoprolol tartrate (LOPRESSOR) 50 MG tablet Take 50 mg by mouth 2 (two) times daily.     [provider]  montelukast (SINGULAIR) 10 MG tablet Take 10 mg by mouth daily.    [provider]  omega-3 acid ethyl esters (LOVAZA) 1 G capsule Take 2 g by mouth 2 (two) times daily.     [provider]  pantoprazole (PROTONIX) 40 MG tablet Take 40 mg by mouth daily. 06/04/19   [provider]  potassium chloride SA (K-DUR,KLOR-CON) 20 MEQ tablet Take 10 mEq by mouth 3 (three) times a week. Take 1/2 tablets (10MEQ) by mouth twice daily for 3 days as directed Mon., Wed., Fri. 12/01/17   [provider]  sildenafil (REVATIO) 20 MG tablet Take 20 mg by mouth 3 (three) times daily.  05/09/19   [provider]  tamsulosin (FLOMAX) 0.4 MG CAPS capsule Take 0.4 mg by mouth daily. 05/07/19   [provider]    Physical Exam: Vitals:   08/06/19 0016 08/06/19 0018 08/06/19 0020 08/06/19 0133  BP:  (!) 163/71  (!) 148/69  Pulse:  77  78  Resp:  (!) 24  18  Temp:  99.2 F (37.3 C)    TempSrc:  Oral    SpO2: 98% 91%  100%  Weight:   95 kg   Height:   5\' 6"  (1.676 m)      Vitals:   08/06/19 0016 08/06/19 0018 08/06/19 0020 08/06/19 0133  BP:  (!) 163/71  (!) 148/69  Pulse:  77  78  Resp:  (!) 24  18  Temp:  99.2 F (37.3 C)    TempSrc:  Oral    SpO2: 98% 91%  100%  Weight:   95 kg   Height:   5\' 6"  (1.676 m)       Constitutional:  Ill-appearing with increased work of breathing.  BiPAP mask on HEENT:      Head: Normocephalic and atraumatic.         Eyes: PERLA, EOMI, Conjunctivae are normal. Sclera is non-icteric.       Mouth/Throat: Mucous membranes are moist.       Neck: Supple with no signs of meningismus. Cardiovascular:  Tachycardic. No murmurs, gallops, or rubs. 2+ symmetrical distal pulses are present . No JVD.  2+ LE edema Respiratory: Respiratory effort increased with decreased lung  sounds bibasilarly with few wheezes.  Rhonchi Gastrointestinal: Soft, non tender, and non distended with positive bowel sounds. No rebound or guarding. Genitourinary: No CVA tenderness. Musculoskeletal: Nontender with normal range of motion in all extremities. No cyanosis, or erythema of extremities. Neurologic:  Face is symmetric. Moving all extremities. No gross focal neurologic deficits . Skin: Skin is warm, dry.  No rash or ulcers Psychiatric: Mood and affect are normal   Labs on Admission: I have personally reviewed following labs and imaging studies  CBC: Recent Labs  Lab 08/06/19 0037  WBC 15.9*  NEUTROABS 13.5*  HGB 10.2*  HCT 32.6*  MCV 85.3  PLT 852   Basic Metabolic Panel: Recent Labs  Lab 08/06/19 0037  NA 135  K 4.4  CL 96*  CO2 28  GLUCOSE 149*  BUN 16  CREATININE 1.00  CALCIUM 9.1   GFR: Estimated Creatinine Clearance: 63.6 mL/min (by C-G formula based on SCr of 1 mg/dL). Liver Function Tests: Recent Labs  Lab 08/06/19 0037  AST 45*  ALT 19  ALKPHOS 99  BILITOT 2.0*  PROT 8.8*  ALBUMIN 4.4   No results for input(s): LIPASE, AMYLASE in the last 168 hours. No results for input(s): AMMONIA in the last 168 hours. Coagulation Profile: No results for input(s): INR, PROTIME in the last 168 hours. Cardiac Enzymes: No results for input(s): CKTOTAL, CKMB, CKMBINDEX, TROPONINI in the last 168 hours. BNP (last 3 results) No results for input(s): PROBNP in the last 8760 hours. HbA1C: No results for input(s): HGBA1C in the last 72 hours. CBG: No results for input(s): GLUCAP in the last 168 hours. Lipid Profile: No results for input(s): CHOL, HDL, LDLCALC, TRIG, CHOLHDL, LDLDIRECT in the last 72 hours. Thyroid Function Tests: No results for input(s): TSH, T4TOTAL, FREET4, T3FREE, THYROIDAB in the last 72 hours. Anemia Panel: No results for input(s): VITAMINB12, FOLATE, FERRITIN, TIBC, IRON, RETICCTPCT in the last 72 hours. Urine analysis:  Component Value Date/Time   COLORURINE YELLOW (A) 04/06/2019 1954   APPEARANCEUR CLEAR (A) 04/06/2019 1954   APPEARANCEUR Clear 07/17/2018 1601   LABSPEC 1.012 04/06/2019 1954   LABSPEC 1.004 05/06/2014 2100   PHURINE 5.0 04/06/2019 [place] NEGATIVE 04/06/2019 1954   GLUCOSEU Negative 05/06/2014 2100   HGBUR NEGATIVE 04/06/2019 [place] NEGATIVE 04/06/2019 1954   BILIRUBINUR Negative 07/17/2018 1601   BILIRUBINUR Negative 05/06/2014 2100   [place] 04/06/2019 1954   PROTEINUR NEGATIVE 04/06/2019 1954   NITRITE NEGATIVE 04/06/2019 1954   LEUKOCYTESUR NEGATIVE 04/06/2019 1954   LEUKOCYTESUR Negative 05/06/2014 2100    Radiological Exams on Admission: DG Chest Port 1 View  Result Date: 08/06/2019 CLINICAL DATA:  79 year old male with dyspnea. EXAM: PORTABLE CHEST 1 VIEW COMPARISON:  Chest radiograph dated 07/06/2019. FINDINGS: There is cardiomegaly with mild vascular congestion. No edema. No focal consolidation, pleural effusion, or pneumothorax. Atherosclerotic calcification of the aorta. No acute osseous pathology. Old healed right clavicular fracture. IMPRESSION: Cardiomegaly with mild vascular congestion. No focal consolidation. Electronically Signed   By: Anner Crete M.D.   On: 08/06/2019 00:50    EKG: Independently reviewed. Interpretation : Junctional rhythm  Assessment/Plan 79 year old male with history of COPD, diabetes, diastolic CHF EF 40% on 10/01/1189,, moderate to severe tricuspid regurgitation and moderate PAH followed by pulmonology, hypothyroidism, history of CVA, A. fib on low-dose Eliquis due to recent history of GI bleed, and dementia, who presents to the emergency room with dyspnea on exertion and shortness of breath that became acutely worse in the past 1 day.    Acute respiratory failure with hypoxia ([place]) -Patient presents with hypoxia 91% on room air and increased work of breathing requiring BiPAP with no prior history of O2  use -CTA chest negative for PE but shows left lower lobe pneumonia -Likely related to left lower lobe pneumonia in combination with CHF exacerbation in the setting of underlying PAH, OSA noncompliant with CPAP -Continue BiPAP due to increased work of breathing and wean as tolerated -Treat pneumonia, CHF  Left lower lobe pneumonia possible HCAP -Patient is chronically dyspneic but took a turn for the worse following a bout of coughing earlier in the day -Low-grade temperature of 99.2 on leukocytosis of 16,000 -  Left lower lobe pneumonia seen on CT chest -Rocephin and azithromycin as well as vancomycin for HCAP -Antitussives, DuoNebs as needed and supplemental oxygen as above   Acute on chronic diastolic CHF (congestive heart failure) ([place]) -Patient with shortness of breath, DOE, pedal edema, BNP 260 on chest x-ray showing pulmonary vascular congestion -Last echo in April 2021 showed EF 55% with moderate to severe tricuspid regurgitation -IV Lasix -Rate control agents metoprolol and digoxin recently discontinued due to symptomatic bradycardia while hospitalized in June 2021 requiring atropine -Continue ARB -Daily weights, intake and output monitoring -Cardiology consult.  Was seen by Dr. Saralyn Pilar on 08/05/19     Atrial fibrillation, chronic ([place]) -Continue low-dose Eliquis 2.5 mg twice daily(dose was reduced following recent GI bleed in June 2021) -Not currently on rate control agents due to evaluation for bradycardia    Dementia without behavioral disturbance ([place]) -Continue home meds    History of GI bleed June 2021 - Patient was hospitalized in June 2021 with symptomatic anemia presenting with shortness of breath, hemoglobin 6.2 and received 2 units packed red blood cells.  He is awaiting capsule endoscopy next month and is being followed by GI.    History of bradycardia -Had recent Holter monitor  and went for follow-up with cardiology -7-day Holter monitor was performed which  revealed predominant sinus bradycardia with mean heart rate of 57 bpm. Minimum heart rate was 41 bpm. -Considerations for future pacemaker placement, per cardiology note    Moderate tricuspid regurgitation -No acute concerns    PAH (pulmonary artery hypertension) ([place]) -Followed by pulmonology    History of CVA (cerebrovascular accident) -On Eliquis and statins  Type 2 diabetes -Sliding scale insulin coverage    DVT prophylaxis: Eliquis Code Status: full code  Family Communication: Son Disposition Plan: Back to previous home environment Consults called: none  Status:At the time of admission, it appears that the appropriate admission status for this patient is INPATIENT. This is judged to be reasonable and necessary in order to provide the required intensity of service to ensure the patient's safety given the presenting symptoms, physical exam findings, and initial radiographic and laboratory data in the context of their  Comorbid conditions.   Patient requires inpatient status due to high intensity of service, high risk for further deterioration and high frequency of surveillance required.   I certify that at the point of admission it is my clinical judgment that the patient will require inpatient hospital care spanning beyond [place] MD [facility]     08/06/2019, 2:31 AM

## 2019-08-06 NOTE — ED Provider Notes (Signed)
[facility] Emergency Department Provider Note  ____________________________________________   First MD Initiated Contact with Patient 08/06/19 0023     (approximate)  I have reviewed the triage vital signs and the nursing notes.   HISTORY History obtained for the patient's son   Chief Complaint Shortness of Breath    HPI Derek Shelton is a 79 y.o. male with below list of previous medical conditions including CHF COPD diabetes hypertension CVA presents to the emergency department secondary to acute onset of dyspnea that began yesterday.  Patient markedly tachypneic on arrival to the emergency department.  Patient son denies any fever however patient was febrile for EMS temperature of 100.2.       Past Medical History:  Diagnosis Date  . Anemia   . Asthma   . CHF (congestive heart failure) ([place])   . COPD (chronic obstructive pulmonary disease) ([place])   . Coronary artery disease   . Dementia ([place])    Per son's report  . Diabetes mellitus without complication ([place])   . Dysrhythmia    Atrial Fibrillation  . GERD (gastroesophageal reflux disease)   . GI bleed   . Hyperlipidemia   . Hypertension   . Hypothyroidism   . Shortness of breath dyspnea   . Sleep apnea   . Stroke ([place])   . Thyroid disease     Patient Active Problem List   Diagnosis Date Noted  . Acute respiratory failure with hypoxia ([place]) 08/06/2019  . Acute on chronic diastolic CHF (congestive heart failure) ([place]) 08/06/2019  . History of GI bleed 08/06/2019  . History of bradycardia 08/06/2019  . Moderate tricuspid regurgitation 08/06/2019  . PAH (pulmonary artery hypertension) ([place]) 08/06/2019  . History of CVA (cerebrovascular accident) 08/06/2019  . LLL pneumonia 08/06/2019  . Dementia without behavioral disturbance ([place])   . Symptomatic anemia 07/06/2019  . BPH (benign prostatic hyperplasia) 07/18/2018  . Degeneration of lumbar intervertebral disc 07/17/2018  .  Osteoarthritis of knee 07/17/2018  . Spinal stenosis of lumbar region 07/17/2018  . Acute CVA (cerebrovascular accident) ([place]) 12/02/2017  . Acute ischemic stroke ([place]) 12/02/2017  . Acute metabolic encephalopathy due to hypoglycemia 11/29/2017  . Essential hypertension 11/29/2017  . Syncope and collapse 11/28/2017  . Atrial fibrillation, chronic ([place]) 05/22/2017  . Chest pain 02/21/2017  . AKI (acute kidney injury) ([place]) 02/21/2017  . SOB (shortness of breath) 02/08/2017  . Atrial fibrillation with RVR ([place]) 02/08/2017  . GI bleeding 08/06/2014  . Anemia 08/06/2014  . Bradycardia 08/06/2014  . Atrial fibrillation ([place]) 08/06/2014  . Hyponatremia 08/06/2014  . Chronic diastolic heart failure ([place]) 08/06/2014  . OSA (obstructive sleep apnea) 08/06/2014  . Memory loss or impairment 05/29/2014  . Type 2 diabetes mellitus without complication ([place]) 35/32/9924  . 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 Chronic rhinitis 08/11/2010  . Coronary artery disease 08/11/2010  . Esophageal reflux 08/11/2010  . Personal history of arthritis 08/11/2010  . Tear film insufficiency 06/07/2010  . 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 Keratoconjunctivitis sicca ([place]) 03/10/2010  . Lens replaced 03/10/2010  . Pterygium 03/10/2010    Past Surgical History:  Procedure Laterality Date  . CARDIAC CATHETERIZATION    . COLONOSCOPY N/A 08/10/2014   Procedure: COLONOSCOPY;  Surgeon: Manya Silvas, MD;  Location: [facility] ENDOSCOPY;  Service: Endoscopy;  Laterality: N/A;  . COLONOSCOPY WITH PROPOFOL N/A 04/05/2016   Procedure: COLONOSCOPY WITH PROPOFOL;  Surgeon: Lollie Sails, MD;  Location: [facility] ENDOSCOPY;  Service: Endoscopy;  Laterality:  N/A;  . COLONOSCOPY WITH PROPOFOL N/A 07/10/2019   Procedure: COLONOSCOPY WITH PROPOFOL;  Surgeon: Lucilla Lame, MD;  Location: [facility] ENDOSCOPY;  Service: Endoscopy;  Laterality: N/A;  . ESOPHAGOGASTRODUODENOSCOPY N/A 08/08/2014   Procedure:  ESOPHAGOGASTRODUODENOSCOPY (EGD);  Surgeon: Manya Silvas, MD;  Location: [facility] ENDOSCOPY;  Service: Endoscopy;  Laterality: N/A;  plan for early afternoon  . ESOPHAGOGASTRODUODENOSCOPY (EGD) WITH PROPOFOL N/A 07/10/2019   Procedure: ESOPHAGOGASTRODUODENOSCOPY (EGD) WITH PROPOFOL;  Surgeon: Lucilla Lame, MD;  Location: [facility] ENDOSCOPY;  Service: Endoscopy;  Laterality: N/A;  . EYE SURGERY    . GIVENS CAPSULE STUDY N/A 08/12/2014   Procedure: GIVENS CAPSULE STUDY;  Surgeon: Manya Silvas, MD;  Location: [facility] ENDOSCOPY;  Service: Endoscopy;  Laterality: N/A;  . rectal fistula N/A     Prior to Admission medications   Medication Sig Start Date End Date Taking? Authorizing Provider  apixaban (ELIQUIS) 2.5 MG TABS tablet Take 1 tablet (2.5 mg total) by mouth 2 (two) times daily. 07/10/19   Lorella Nimrod, MD  atorvastatin (LIPITOR) 40 MG tablet Take 1 tablet (40 mg total) by mouth daily at 6 PM. 12/03/17   Demetrios Loll, MD  budesonide-formoterol [facility]) 160-4.5 MCG/ACT inhaler Inhale 2 puffs into the lungs 2 (two) times daily.    [provider]  digoxin (LANOXIN) 0.125 MG tablet Take 1 tablet (0.125 mg total) by mouth daily. Patient taking differently: Take 0.125 mg by mouth. TAKE ONCE A DAY EVERY OTHER DAY. 12/08/17   Hillary Bow, MD  donepezil (ARICEPT) 5 MG tablet Take 5 mg by mouth at bedtime.    [provider]  DULoxetine (CYMBALTA) 60 MG capsule Take 60 mg by mouth daily.    [provider]  ferrous sulfate 325 (65 FE) MG EC tablet Take 1 tablet (325 mg total) by mouth 2 (two) times daily. 07/11/19 07/10/20  Lorella Nimrod, MD  finasteride (PROSCAR) 5 MG tablet Take 1 tablet (5 mg total) by mouth daily. 07/18/18   Stoioff, Ronda Fairly, MD  furosemide (LASIX) 20 MG tablet Take 40 mg by mouth daily.     [provider]  insulin degludec (TRESIBA FLEXTOUCH) 100 UNIT/ML SOPN FlexTouch Pen Inject 30 Units into the skin at bedtime.     [provider]    ipratropium-albuterol (DUONEB) 0.5-2.5 (3) MG/3ML SOLN Inhale 3 mLs into the lungs every 6 (six) hours as needed for shortness of breath. 01/22/17   [provider]  latanoprost (XALATAN) 0.005 % ophthalmic solution latanoprost 0.005 % eye drops  INSTILL ONE DROP TO BOTH EYES AT BEDTIME.    [provider]  levothyroxine (SYNTHROID, LEVOTHROID) 112 MCG tablet Take 112 mcg by mouth daily before breakfast.     [provider]  Melatonin 3 MG TABS Take 3 mg by mouth at bedtime as needed (sleep).     [provider]  metoprolol tartrate (LOPRESSOR) 50 MG tablet Take 50 mg by mouth 2 (two) times daily.     [provider]  montelukast (SINGULAIR) 10 MG tablet Take 10 mg by mouth daily.    [provider]  omega-3 acid ethyl esters (LOVAZA) 1 G capsule Take 2 g by mouth 2 (two) times daily.     [provider]  pantoprazole (PROTONIX) 40 MG tablet Take 40 mg by mouth daily. 06/04/19   [provider]  potassium chloride SA (K-DUR,KLOR-CON) 20 MEQ tablet Take 10 mEq by mouth 3 (three) times a week. Take 1/2 tablets (10MEQ) by mouth twice daily for  3 days as directed Mon., Wed., Fri. 12/01/17   [provider]  sildenafil (REVATIO) 20 MG tablet Take 20 mg by mouth 3 (three) times daily. 05/09/19   [provider]  tamsulosin (FLOMAX) 0.4 MG CAPS capsule Take 0.4 mg by mouth daily. 05/07/19   [provider]    Allergies Aricept Reather Littler hcl], Catapres [clonidine hcl], Coreg [carvedilol], and Penicillins  Family History  Problem Relation Age of Onset  . Diabetes Mother     Social History Social History   Tobacco Use  . Smoking status: Former Smoker    Quit date: 2000    Years since quitting: 21.5  . Smokeless tobacco: Former Systems developer    Types: Secondary school teacher  . Vaping Use: Never used  Substance Use Topics  . Alcohol use: No  . Drug use: No    Review of Systems Constitutional: Positive for  fever/chills Eyes: No visual changes. ENT: No sore throat. Cardiovascular: Denies chest pain. Respiratory:  positive for cough and shortness of breath. Gastrointestinal: No abdominal pain.  No nausea, no vomiting.  No diarrhea.  No constipation. Genitourinary: Negative for dysuria. Musculoskeletal: Negative for neck pain.  Negative for back pain. Integumentary: Negative for rash. Neurological: Negative for headaches, focal weakness or numbness.  ____________________________________________   PHYSICAL EXAM:  VITAL SIGNS: ED Triage Vitals  Enc Vitals Group     BP 08/06/19 0018 (!) 163/71     Pulse Rate 08/06/19 0018 77     Resp 08/06/19 0018 (!) 24     Temp 08/06/19 0018 99.2 F (37.3 C)     Temp Source 08/06/19 0018 Oral     SpO2 08/06/19 0016 98 %     Weight 08/06/19 0020 95 kg (209 lb 7 oz)     Height 08/06/19 0020 1.676 m (5\' 6" )     Head Circumference --      Peak Flow --      Pain Score 08/06/19 0020 0     Pain Loc --      Pain Edu? --      Excl. in [place]? --     Constitutional: Alert and oriented.  Apparent respiratory distress Eyes: Conjunctivae are normal.  Mouth/Throat: Patient is wearing a mask. Neck: No stridor.  No meningeal signs.   Cardiovascular: Normal rate, regular rhythm. Good peripheral circulation. Grossly normal heart sounds. Respiratory: Normal respiratory effort.  No retractions. Gastrointestinal: Soft and nontender. No distention.  Musculoskeletal: 2+ bilateral lower extremity pitting edema no gross deformities of extremities. Neurologic:  Normal speech and language. No gross focal neurologic deficits are appreciated.  Skin:  Skin is warm, dry and intact. Psychiatric: Mood and affect are normal. Speech and behavior are normal.  ____________________________________________   LABS (all labs ordered are listed, but only abnormal results are displayed)  Labs Reviewed  CBC WITH DIFFERENTIAL/PLATELET - Abnormal; Notable for the following components:       Result Value   WBC 15.9 (*)    RBC 3.82 (*)    Hemoglobin 10.2 (*)    HCT 32.6 (*)    RDW 26.5 (*)    Neutro Abs 13.5 (*)    Abs Immature Granulocytes 0.08 (*)    All other components within normal limits  BLOOD GAS, VENOUS - Abnormal; Notable for the following components:   pH, Ven 7.48 (*)    pCO2, Ven 40 (*)    pO2, Ven 76.0 (*)    Bicarbonate 29.8 (*)    Acid-Base Excess 5.8 (*)  All other components within normal limits  COMPREHENSIVE METABOLIC PANEL - Abnormal; Notable for the following components:   Chloride 96 (*)    Glucose, Bld 149 (*)    Total Protein 8.8 (*)    AST 45 (*)    Total Bilirubin 2.0 (*)    All other components within normal limits  BRAIN NATRIURETIC PEPTIDE - Abnormal; Notable for the following components:   B Natriuretic Peptide 260.4 (*)    All other components within normal limits  TROPONIN I (HIGH SENSITIVITY) - Abnormal; Notable for the following components:   Troponin I (High Sensitivity) 18 (*)    All other components within normal limits  TROPONIN I (HIGH SENSITIVITY) - Abnormal; Notable for the following components:   Troponin I (High Sensitivity) 26 (*)    All other components within normal limits   ____________________________________________  EKG  ED ECG REPORT I, [place] N Zenith Kercheval, the attending physician, personally viewed and interpreted this ECG.   Date: 08/06/2019  EKG Time: 12:37 AM  Rate: 74  Rhythm: Normal sinus rhythm  Axis: Normal  Intervals: Normal  ST&T Change: None ____________________________________  RADIOLOGY I, [place] N Angelia Hazell, personally viewed and evaluated these images (plain radiographs) as part of my medical decision making, as well as reviewing the written report by the radiologist.  ED MD interpretation: Left lower lobe pneumonia noted on CT cardiomegaly without any evidence of failure.  Official radiology report(s): CT Angio Chest PE W and/or Wo Contrast  Result Date: 08/06/2019 CLINICAL  DATA:  Shortness of breath for 1 day. EXAM: CT ANGIOGRAPHY CHEST WITH CONTRAST TECHNIQUE: Multidetector CT imaging of the chest was performed using the standard protocol during bolus administration of intravenous contrast. Multiplanar CT image reconstructions and MIPs were obtained to evaluate the vascular anatomy. CONTRAST:  57mL OMNIPAQUE IOHEXOL 350 MG/ML SOLN COMPARISON:  Chest x-ray from earlier today FINDINGS: Cardiovascular: Enlarged heart. No pericardial effusion. Limited assessment of the pulmonary arteries due to motion primarily. No visible pulmonary embolism. Diffuse atherosclerotic calcification of the aorta and multifocal coronary atherosclerosis. Mediastinum/Nodes: Negative for adenopathy or mass. Lungs/Pleura: Left lower lobe consolidation. Milder airspace disease in the superior segment right lower lobe. No edema, effusion, or pneumothorax. Motion artifact. Tracheomalacia. Upper Abdomen: No acute finding Musculoskeletal: Spondylosis with multilevel thoracic ankylosis. Review of the MIP images confirms the above findings. IMPRESSION: 1. Left lower lobe pneumonia. 2. Cardiomegaly without failure. 3. As permitted by moderate respiratory motion, no evidence of pulmonary embolism. Electronically Signed   By: Monte Fantasia M.D.   On: 08/06/2019 04:57   DG Chest Port 1 View  Result Date: 08/06/2019 CLINICAL DATA:  79 year old male with dyspnea. EXAM: PORTABLE CHEST 1 VIEW COMPARISON:  Chest radiograph dated 07/06/2019. FINDINGS: There is cardiomegaly with mild vascular congestion. No edema. No focal consolidation, pleural effusion, or pneumothorax. Atherosclerotic calcification of the aorta. No acute osseous pathology. Old healed right clavicular fracture. IMPRESSION: Cardiomegaly with mild vascular congestion. No focal consolidation. Electronically Signed   By: Anner Crete M.D.   On: 08/06/2019 00:50    ____________________________________________   PROCEDURES   .Critical  Care Performed by: Gregor Hams, MD Authorized by: Gregor Hams, MD   Critical care provider statement:    Critical care time (minutes):  30   Critical care time was exclusive of:  Separately billable procedures and treating other patients   Critical care was necessary to treat or prevent imminent or life-threatening deterioration of the following conditions:  Respiratory failure   Critical care was  time spent personally by me on the following activities:  Development of treatment plan with patient or surrogate, discussions with consultants, evaluation of patient's response to treatment, examination of patient, obtaining history from patient or surrogate, ordering and performing treatments and interventions, ordering and review of laboratory studies, ordering and review of radiographic studies, pulse oximetry, re-evaluation of patient's condition and review of old charts     ____________________________________________   [place] / MDM / [place] / ED COURSE  As part of my medical decision making, I reviewed the following data within the electronic MEDICAL RECORD NUMBER   79 year old male presented with above-stated history and physical exam a differential diagnosis including but not limited to pneumonia, COPD exacerbation, CHF exacerbation, pulmonary emboli.  Laboratory data revealed a blood cell count 15.9.  Chest x-ray revealed no focal consolidation mild vascular congestion with cardiomegaly.  CT chest however did reveal a left lower lobe pneumonia.  Patient was placed on BiPAP after my evaluation secondary to respiratory distress.  Patient received IV ceftriaxone 1 g and azithromycin 500 mg.  Patient was also given Lasix 40 mg secondary to concern for cardiomegaly with vascular congestion and bilateral lower extremity edema.  Patient discussed with Dr. Damita Dunnings for hospital admission further evaluation and  management. ____________________________________________  FINAL CLINICAL IMPRESSION(S) / ED DIAGNOSES  Final diagnoses:  Community acquired pneumonia of left lower lobe of lung     MEDICATIONS GIVEN DURING THIS VISIT:  Medications  labetalol (NORMODYNE) 5 MG/ML injection (  Not Given 08/06/19 0303)  cefTRIAXone (ROCEPHIN) 1 g in sodium chloride 0.9 % 100 mL IVPB (has no administration in time range)  azithromycin (ZITHROMAX) 500 mg in sodium chloride 0.9 % 250 mL IVPB (has no administration in time range)  ipratropium-albuterol (DUONEB) 0.5-2.5 (3) MG/3ML nebulizer solution 3 mL (3 mLs Nebulization Given 08/06/19 0026)  ipratropium-albuterol (DUONEB) 0.5-2.5 (3) MG/3ML nebulizer solution 3 mL (3 mLs Nebulization Given 08/06/19 0025)  methylPREDNISolone sodium succinate (SOLU-MEDROL) 125 mg/2 mL injection 60 mg (60 mg Intravenous Given 08/06/19 0038)  ipratropium-albuterol (DUONEB) 0.5-2.5 (3) MG/3ML nebulizer solution 3 mL (3 mLs Nebulization Given 08/06/19 0025)  furosemide (LASIX) injection 40 mg (40 mg Intravenous Given 08/06/19 0120)  iohexol (OMNIPAQUE) 350 MG/ML injection 75 mL (75 mLs Intravenous Contrast Given 08/06/19 0442)     ED Discharge Orders    None      *Please note:  SHYLOH KRINKE was evaluated in Emergency Department on 08/06/2019 for the symptoms described in the history of present illness. He was evaluated in the context of the global COVID-19 pandemic, which necessitated consideration that the patient might be at risk for infection with the SARS-CoV-2 virus that causes COVID-19. Institutional protocols and algorithms that pertain to the evaluation of patients at risk for COVID-19 are in a state of rapid change based on information released by regulatory bodies including the CDC and federal and state organizations. These policies and algorithms were followed during the patient's care in the ED.  Some ED evaluations and interventions may be delayed as a result of limited  staffing during and after the pandemic.*  Note:  This document was prepared using Dragon voice recognition software and may include unintentional dictation errors.   Gregor Hams, MD 08/06/19 [phone]

## 2019-08-06 NOTE — ED Notes (Signed)
Pt bed saturated with urine. Unable to tell son when he needs to use the restroom. Pt full linen change at this time. Pt placed in a gown and external cath placed on pt. Pt repositioned in the bed.

## 2019-08-06 NOTE — ED Triage Notes (Signed)
Pt arrives to ED via [facility] from home with c/o [facility] x1 day. EMS reports pt seen by PCP today, but came in for worsening SHOB throughout the day. Pt does not speak [place]; awaiting family at this time.

## 2019-08-06 NOTE — TOC Initial Note (Signed)
Transition of Care [facility]) - Initial/Assessment Note    Patient Details  Name: Derek Shelton MRN: [id number] Date of Birth: 01-09-1941  Transition of [facility]) CM/SW Contact:    Anselm Pancoast, RN Phone Number: 08/06/2019, 4:01 PM  Clinical Narrative:                 Spoke with son, Derek Shelton, who confirmed patient lives at home with his spouse and son is wanting to hire private duty aides to assist the spouse. Son states patient is becoming more and more care and spouse is hesitant to accept help which causes the family to worry about her as well as patient. RN CM advised will send list of potential private duty agencies and encouraged son to reach out to [facility] for possible coverage as well. Son requested list be sent to his email @ [email or web address] so he can research each provider as he will have to convince the spouse to accept the help. Patient has had previous rehab admissions as well as home health care for rehab and family states [place] was the most beneficial and they would appreciate [place] for continued rehab at discharge if possible. No other needs at this time.   Expected Discharge Plan: [place] Barriers to Discharge: Continued Medical Work up   Patient Goals and CMS Choice Patient states their goals for this hospitalization and ongoing recovery are:: Get better and get some resources for home-private duty services to assist spouse in care      Expected Discharge Plan and Services Expected Discharge Plan: [place] In-house Referral: Clinical Social Work     Living arrangements for the past 2 months: Single Family Home                                      Prior Living Arrangements/Services Living arrangements for the past 2 months: Single Family Home Lives with:: Spouse Patient language and need for interpreter reviewed:: Yes Do you feel safe going back to the place where you live?: Yes      Need for Family Participation in  Patient Care: Yes (Comment) Care giver support system in place?: Yes (comment) Current home services: DME (walker, wheelchair) Criminal Activity/Legal Involvement Pertinent to Current Situation/Hospitalization: No - Comment as needed  Activities of Daily Living      Permission Sought/Granted Permission sought to share information with : Facility Sport and exercise psychologist, Case Optician, dispensing granted to share information with : Yes, Verbal Permission Granted  Share Information with NAME: [facility] department  Permission granted to share info w AGENCY: Potential private duty services        Emotional Assessment Appearance:: Appears stated age Attitude/Demeanor/Rapport: Unable to Assess Affect (typically observed): Unable to Assess Orientation: : Oriented to Self, Oriented to Place, Oriented to  Time, Oriented to Situation (son states advancing dementia at times) Alcohol / Substance Use: Not Applicable Psych Involvement: No (comment)  Admission diagnosis:  HCAP (healthcare-associated pneumonia) [J18.9] Patient Active Problem List   Diagnosis Date Noted  . Acute respiratory failure with hypoxia ([place]) 08/06/2019  . Acute on chronic diastolic CHF (congestive heart failure) ([place]) 08/06/2019  . History of GI bleed 08/06/2019  . History of bradycardia 08/06/2019  . Moderate tricuspid regurgitation 08/06/2019  . PAH (pulmonary artery hypertension) ([place]) 08/06/2019  . History of CVA (cerebrovascular accident) 08/06/2019  . LLL pneumonia 08/06/2019  . HCAP (healthcare-associated pneumonia)  08/06/2019  . Dementia without behavioral disturbance ([place])   . Symptomatic anemia 07/06/2019  . BPH (benign prostatic hyperplasia) 07/18/2018  . Degeneration of lumbar intervertebral disc 07/17/2018  . Osteoarthritis of knee 07/17/2018  . Spinal stenosis of lumbar region 07/17/2018  . Acute CVA (cerebrovascular accident) ([place]) 12/02/2017  . Acute ischemic stroke ([place]) 12/02/2017  . Acute metabolic  encephalopathy due to hypoglycemia 11/29/2017  . Essential hypertension 11/29/2017  . Syncope and collapse 11/28/2017  . Atrial fibrillation, chronic ([place]) 05/22/2017  . Chest pain 02/21/2017  . AKI (acute kidney injury) ([place]) 02/21/2017  . SOB (shortness of breath) 02/08/2017  . Atrial fibrillation with RVR ([place]) 02/08/2017  . GI bleeding 08/06/2014  . Anemia 08/06/2014  . Bradycardia 08/06/2014  . Atrial fibrillation ([place]) 08/06/2014  . Hyponatremia 08/06/2014  . Chronic diastolic heart failure ([place]) 08/06/2014  . OSA (obstructive sleep apnea) 08/06/2014  . Memory loss or impairment 05/29/2014  . Type 2 diabetes mellitus without complication ([place]) 36/62/9476  . 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 Chronic rhinitis 08/11/2010  . Coronary artery disease 08/11/2010  . Esophageal reflux 08/11/2010  . Personal history of arthritis 08/11/2010  . Tear film insufficiency 06/07/2010  . 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 Keratoconjunctivitis sicca ([place]) 03/10/2010  . Lens replaced 03/10/2010  . Pterygium 03/10/2010   PCP:  Gladstone Lighter, MD Pharmacy:   [id number] [address], [place] [address] Phone: [phone] Fax: [phone]     Social Determinants of Health (SDOH) Interventions    Readmission Risk Interventions No flowsheet data found.

## 2019-08-06 NOTE — ED Notes (Signed)
Report received from [place], [place]. Pt currently resting comfortably on ED stretcher. ED stretcher locked and in lowest position. Son at bedside at this time. Introduced self to family and patient. Pt currently on BiPap and connected to the cardiac monitor. V/S WNL at this time. Call bell within reach.

## 2019-08-06 NOTE — ED Notes (Signed)
Pt currently on 3L [place], O2 sat 100%

## 2019-08-06 NOTE — Progress Notes (Signed)
Cough and shortness of breath are a little better.  He was on BiPAP this morning at the time of my visit.  Continue current treatment.  Plan of care was discussed with his son at the bedside and another son (Dr. Linus Mako, a physician in [place]) who was on speaker phone.  According to his son, patient is nonadherent to CPAP machine at home.

## 2019-08-06 NOTE — ED Notes (Signed)
Only 1 set of blood cultures collected d/t pt already having 2 IVs and ABX ordered; blood cultures collected by this RN from left hand.

## 2019-08-07 ENCOUNTER — Other Ambulatory Visit: Payer: Self-pay

## 2019-08-07 ENCOUNTER — Encounter: Payer: Self-pay | Admitting: Internal Medicine

## 2019-08-07 DIAGNOSIS — J181 Lobar pneumonia, unspecified organism: Secondary | ICD-10-CM

## 2019-08-07 LAB — CBC WITH DIFFERENTIAL/PLATELET
Abs Immature Granulocytes: 0.08 10*3/uL — ABNORMAL HIGH (ref 0.00–0.07)
Basophils Absolute: 0 10*3/uL (ref 0.0–0.1)
Basophils Relative: 0 %
Eosinophils Absolute: 0 10*3/uL (ref 0.0–0.5)
Eosinophils Relative: 0 %
HCT: 33.4 % — ABNORMAL LOW (ref 39.0–52.0)
Hemoglobin: 11 g/dL — ABNORMAL LOW (ref 13.0–17.0)
Immature Granulocytes: 1 %
Lymphocytes Relative: 8 %
Lymphs Abs: 1.2 10*3/uL (ref 0.7–4.0)
MCH: 27.1 pg (ref 26.0–34.0)
MCHC: 32.9 g/dL (ref 30.0–36.0)
MCV: 82.3 fL (ref 80.0–100.0)
Monocytes Absolute: 0.9 10*3/uL (ref 0.1–1.0)
Monocytes Relative: 7 %
Neutro Abs: 12.1 10*3/uL — ABNORMAL HIGH (ref 1.7–7.7)
Neutrophils Relative %: 84 %
Platelets: 203 10*3/uL (ref 150–400)
RBC: 4.06 MIL/uL — ABNORMAL LOW (ref 4.22–5.81)
RDW: 26 % — ABNORMAL HIGH (ref 11.5–15.5)
Smear Review: NORMAL
WBC: 14.3 10*3/uL — ABNORMAL HIGH (ref 4.0–10.5)
nRBC: 0 % (ref 0.0–0.2)

## 2019-08-07 LAB — BASIC METABOLIC PANEL
Anion gap: 10 (ref 5–15)
BUN: 26 mg/dL — ABNORMAL HIGH (ref 8–23)
CO2: 30 mmol/L (ref 22–32)
Calcium: 8.8 mg/dL — ABNORMAL LOW (ref 8.9–10.3)
Chloride: 99 mmol/L (ref 98–111)
Creatinine, Ser: 1.1 mg/dL (ref 0.61–1.24)
GFR calc Af Amer: >60 mL/min (ref >60)
GFR calc non Af Amer: >60 mL/min (ref >60)
Glucose, Bld: 211 mg/dL — ABNORMAL HIGH (ref 70–99)
Potassium: 3.3 mmol/L — ABNORMAL LOW (ref 3.5–5.1)
Sodium: 139 mmol/L (ref 135–145)

## 2019-08-07 MED ORDER — METOPROLOL TARTRATE 5 MG/5ML IV SOLN
2.5000 mg | INTRAVENOUS | Status: DC | PRN
  Administered 2019-08-07 (×3): 2.5 mg via INTRAVENOUS
  Filled 2019-08-07 (×3): qty 5

## 2019-08-07 MED ORDER — ATORVASTATIN CALCIUM 20 MG PO TABS
40.0000 mg | ORAL_TABLET | Freq: Every day | ORAL | Status: DC
  Administered 2019-08-07 – 2019-08-11 (×5): 40 mg via ORAL
  Filled 2019-08-07 (×5): qty 2

## 2019-08-07 MED ORDER — LEVOTHYROXINE SODIUM 112 MCG PO TABS
112.0000 ug | ORAL_TABLET | Freq: Every day | ORAL | Status: DC
  Administered 2019-08-08 – 2019-08-11 (×4): 112 ug via ORAL
  Filled 2019-08-07 (×4): qty 1

## 2019-08-07 MED ORDER — DULOXETINE HCL 30 MG PO CPEP
60.0000 mg | ORAL_CAPSULE | Freq: Every day | ORAL | Status: DC
  Administered 2019-08-07 – 2019-08-11 (×5): 60 mg via ORAL
  Filled 2019-08-07 (×5): qty 2

## 2019-08-07 MED ORDER — PANTOPRAZOLE SODIUM 40 MG PO TBEC
40.0000 mg | DELAYED_RELEASE_TABLET | Freq: Every day | ORAL | Status: DC
  Administered 2019-08-07 – 2019-08-11 (×5): 40 mg via ORAL
  Filled 2019-08-07 (×5): qty 1

## 2019-08-07 MED ORDER — POTASSIUM CHLORIDE CRYS ER 20 MEQ PO TBCR
40.0000 meq | EXTENDED_RELEASE_TABLET | Freq: Once | ORAL | Status: AC
  Administered 2019-08-07: 40 meq via ORAL
  Filled 2019-08-07: qty 2

## 2019-08-07 MED ORDER — METOPROLOL TARTRATE 5 MG/5ML IV SOLN: 2.5000 mg | INTRAVENOUS | Status: DC | PRN

## 2019-08-07 MED ORDER — FINASTERIDE 5 MG PO TABS
5.0000 mg | ORAL_TABLET | Freq: Every day | ORAL | Status: DC
  Administered 2019-08-07 – 2019-08-11 (×5): 5 mg via ORAL
  Filled 2019-08-07 (×5): qty 1

## 2019-08-07 MED ORDER — IPRATROPIUM-ALBUTEROL 0.5-2.5 (3) MG/3ML IN SOLN: 3.0000 mL | Freq: Four times a day (QID) | RESPIRATORY_TRACT | Status: DC | PRN

## 2019-08-07 MED ORDER — SILDENAFIL CITRATE 20 MG PO TABS
20.0000 mg | ORAL_TABLET | Freq: Three times a day (TID) | ORAL | Status: DC
  Administered 2019-08-07 – 2019-08-11 (×12): 20 mg via ORAL
  Filled 2019-08-07 (×14): qty 1

## 2019-08-07 MED ORDER — TAMSULOSIN HCL 0.4 MG PO CAPS
0.4000 mg | ORAL_CAPSULE | Freq: Every day | ORAL | Status: DC
  Administered 2019-08-07 – 2019-08-11 (×5): 0.4 mg via ORAL
  Filled 2019-08-07 (×5): qty 1

## 2019-08-07 MED ORDER — METOPROLOL TARTRATE 25 MG PO TABS
25.0000 mg | ORAL_TABLET | Freq: Two times a day (BID) | ORAL | Status: DC
  Administered 2019-08-07 – 2019-08-08 (×4): 25 mg via ORAL
  Filled 2019-08-07 (×5): qty 1

## 2019-08-07 NOTE — Progress Notes (Signed)
PROGRESS NOTE    Derek Shelton  GYF:[id number] DOB: [birth year] DOA: 08/06/2019 PCP: Gladstone Lighter, MD    Assessment & Plan:   Principal Problem:   Acute respiratory failure with hypoxia ([place]) Active Problems:   GI bleeding   Type 2 diabetes mellitus without complication (HCC)   OSA (obstructive sleep apnea)   Atrial fibrillation, chronic (HCC)   Dementia without behavioral disturbance (HCC)   Acute on chronic diastolic CHF (congestive heart failure) (HCC)   History of GI bleed   History of bradycardia   Moderate tricuspid regurgitation   PAH (pulmonary artery hypertension) ([place])   History of CVA (cerebrovascular accident)   LLL pneumonia   HCAP (healthcare-associated pneumonia)   Acute CHF (congestive heart failure) ([place])   Acute respiratory failure with hypoxia: initially requiring BiPAP with no prior history of O2 use but since has been weaned off. CTA chest negative for PE but shows left lower lobe pneumonia. Weaned off of supplemental oxygen today   Left lower lobe pneumonia: possible HCAP. Continue on IV rocephin, azithromycin.  Bronchodilators prn. Weaned off of supplemental oxygen today   Leukocytosis: secondary to infection. Continue on IV abxs    Acute on chronic diastolic CHF: last echo in April 2021 showed EF 55%, moderate to severe tricuspid regurgitation. Continue on IV Lasix. Monitor I/Os. Cardio following    Chronic atrial fibrillation: w/ RVR currently so will restart metoprolol. Continue low-dose Eliquis 2.5 mg twice daily(dose was reduced following recent GI bleed in June 2021). Continue hold digoxin secondary to bradycardia  Dementia without behavioral disturbance: re-orient prn   History of GI bleed June 2021: was hospitalized in June 2021 with symptomatic anemia presenting with shortness of breath, hemoglobin 6.2 and received 2 units packed red blood cells.  Awaiting capsule endoscopy next month and is being followed by GI.  History of  bradycardia: had recent Holter monitor which revealed predominant sinus bradycardia with mean heart rate of 57 bpm. Minimum heart rate was 41 bpm. Considerations for future pacemaker placement, per cardiology note  Moderate tricuspid regurgitation: management as per cardio   Pulmonary artery hypertension: continue on home dose of sildenafil. Followed by pulmonology outpatient   History of CVA : continue on eliquis and statins  DM2: continue on SSI   Hypothyroidism: continue on home dose of levothyroxine  Depression: severity unknown. Continue on home dose of duloxetine   GERD: continue on home dose of PPI  BPH: continue on home dose of tamsulosin, finasteride  HLD: continue on statin   Hypokalemia: KCl repleted. Will continue to monitor    DVT prophylaxis:  eliquis  Code Status: full  Family Communication: discussed pt's care w/ pt's son who is at bedside and answered his questions  Disposition Plan:  Likely d/c home    Consultants:      Procedures:    Antimicrobials: ceftriaxone, azithromycin     Subjective: Pt c/o shortness of breath  Objective: Vitals:   08/07/19 0450 08/07/19 0500 08/07/19 0700 08/07/19 0732  BP: 126/85   126/88  Pulse: (!) 139   (!) 138  Resp: 19  19 20   Temp: 98.7 F (37.1 C)   97.7 F (36.5 C)  TempSrc: Oral   Oral  SpO2: 100%   100%  Weight:      Height:  5\' 4"  (1.626 m)      Intake/Output Summary (Last 24 hours) at 08/07/2019 0834 Last data filed at 08/07/2019 0550 Gross per 24 hour  Intake 280 ml  Output 1500  ml  Net -1220 ml   Filed Weights   08/06/19 0020  Weight: 95 kg    Examination:  General exam: Appears calm and comfortable  Respiratory system: diminished breath sounds b/l. No rales  Cardiovascular system: irregularly irregular. No  rubs, gallops or clicks.  Gastrointestinal system: Abdomen is obese, soft and nontender.  Normal bowel sounds heard. Central nervous system: Alert and oriented. Moves all 4  extremities Psychiatry: Judgement and insight appear normal. Flat mood and affect     Data Reviewed: I have personally reviewed following labs and imaging studies  CBC: Recent Labs  Lab 08/06/19 0037 08/07/19 0530  WBC 15.9* 14.3*  NEUTROABS 13.5* 12.1*  HGB 10.2* 11.0*  HCT 32.6* 33.4*  MCV 85.3 82.3  PLT 194 323   Basic Metabolic Panel: Recent Labs  Lab 08/06/19 0037 08/07/19 0530  NA 135 139  K 4.4 3.3*  CL 96* 99  CO2 28 30  GLUCOSE 149* 211*  BUN 16 26*  CREATININE 1.00 1.10  CALCIUM 9.1 8.8*   GFR: Estimated Creatinine Clearance: 55.7 mL/min (by C-G formula based on SCr of 1.1 mg/dL). Liver Function Tests: Recent Labs  Lab 08/06/19 0037  AST 45*  ALT 19  ALKPHOS 99  BILITOT 2.0*  PROT 8.8*  ALBUMIN 4.4   No results for input(s): LIPASE, AMYLASE in the last 168 hours. No results for input(s): AMMONIA in the last 168 hours. Coagulation Profile: No results for input(s): INR, PROTIME in the last 168 hours. Cardiac Enzymes: No results for input(s): CKTOTAL, CKMB, CKMBINDEX, TROPONINI in the last 168 hours. BNP (last 3 results) No results for input(s): PROBNP in the last 8760 hours. HbA1C: No results for input(s): HGBA1C in the last 72 hours. CBG: No results for input(s): GLUCAP in the last 168 hours. Lipid Profile: No results for input(s): CHOL, HDL, LDLCALC, TRIG, CHOLHDL, LDLDIRECT in the last 72 hours. Thyroid Function Tests: No results for input(s): TSH, T4TOTAL, FREET4, T3FREE, THYROIDAB in the last 72 hours. Anemia Panel: No results for input(s): VITAMINB12, FOLATE, FERRITIN, TIBC, IRON, RETICCTPCT in the last 72 hours. Sepsis Labs: No results for input(s): PROCALCITON, LATICACIDVEN in the last 168 hours.  Recent Results (from the past 240 hour(s))  Culture, blood (routine x 2) Call MD if unable to obtain prior to antibiotics being given     Status: None (Preliminary result)   Collection Time: 08/06/19  5:38 AM   Specimen: BLOOD  Result  Value Ref Range Status   Specimen Description BLOOD LEFT HAND  Final   Special Requests BOTTLES DRAWN AEROBIC AND ANAEROBIC [place]  Final   Culture   Final    NO GROWTH 1 DAY Performed at [facility], [address], [place], [place] [address]    Report Status PENDING  Incomplete  SARS Coronavirus 2 by RT PCR (hospital order, performed in [place] hospital lab) Nasopharyngeal Nasopharyngeal Swab     Status: None   Collection Time: 08/06/19  5:38 AM   Specimen: Nasopharyngeal Swab  Result Value Ref Range Status   SARS Coronavirus 2 NEGATIVE NEGATIVE Final    Comment: (NOTE) SARS-CoV-2 target nucleic acids are NOT DETECTED.  The SARS-CoV-2 RNA is generally detectable in upper and lower respiratory specimens during the acute phase of infection. The lowest concentration of SARS-CoV-2 viral copies this assay can detect is 250 copies / mL. A negative result does not preclude SARS-CoV-2 infection and should not be used as the sole basis for treatment or other patient management decisions.  A negative result may occur with improper specimen collection / handling, submission of specimen other than nasopharyngeal swab, presence of viral mutation(s) within the areas targeted by this assay, and inadequate number of viral copies (<250 copies / mL). A negative result must be combined with clinical observations, patient history, and epidemiological information.  Fact Sheet for Patients:   StrictlyIdeas.no  Fact Sheet for Healthcare Providers: BankingDealers.co.za  This test is not yet approved or  cleared by the [place] FDA and has been authorized for detection and/or diagnosis of SARS-CoV-2 by FDA under an Emergency Use Authorization (EUA).  This EUA will remain in effect (meaning this test can be used) for the duration of the COVID-19 declaration under Section 564(b)(1) of the Act, 21 U.S.C. section 360bbb-3(b)(1), unless the  authorization is terminated or revoked sooner.  Performed at [facility], [address], [place], [place] [address]          Radiology Studies: CT Angio Chest PE W and/or Wo Contrast  Result Date: 08/06/2019 CLINICAL DATA:  Shortness of breath for 1 day. EXAM: CT ANGIOGRAPHY CHEST WITH CONTRAST TECHNIQUE: Multidetector CT imaging of the chest was performed using the standard protocol during bolus administration of intravenous contrast. Multiplanar CT image reconstructions and MIPs were obtained to evaluate the vascular anatomy. CONTRAST:  15mL OMNIPAQUE IOHEXOL 350 MG/ML SOLN COMPARISON:  Chest x-ray from earlier today FINDINGS: Cardiovascular: Enlarged heart. No pericardial effusion. Limited assessment of the pulmonary arteries due to motion primarily. No visible pulmonary embolism. Diffuse atherosclerotic calcification of the aorta and multifocal coronary atherosclerosis. Mediastinum/Nodes: Negative for adenopathy or mass. Lungs/Pleura: Left lower lobe consolidation. Milder airspace disease in the superior segment right lower lobe. No edema, effusion, or pneumothorax. Motion artifact. Tracheomalacia. Upper Abdomen: No acute finding Musculoskeletal: Spondylosis with multilevel thoracic ankylosis. Review of the MIP images confirms the above findings. IMPRESSION: 1. Left lower lobe pneumonia. 2. Cardiomegaly without failure. 3. As permitted by moderate respiratory motion, no evidence of pulmonary embolism. Electronically Signed   By: Monte Fantasia M.D.   On: 08/06/2019 04:57   DG Chest Port 1 View  Result Date: 08/06/2019 CLINICAL DATA:  79 year old male with dyspnea. EXAM: PORTABLE CHEST 1 VIEW COMPARISON:  Chest radiograph dated 07/06/2019. FINDINGS: There is cardiomegaly with mild vascular congestion. No edema. No focal consolidation, pleural effusion, or pneumothorax. Atherosclerotic calcification of the aorta. No acute osseous pathology. Old healed right clavicular fracture.  IMPRESSION: Cardiomegaly with mild vascular congestion. No focal consolidation. Electronically Signed   By: Anner Crete M.D.   On: 08/06/2019 00:50        Scheduled Meds:  apixaban  2.5 mg Oral BID   furosemide  40 mg Intravenous Q12H   sodium chloride flush  3 mL Intravenous Q12H   Continuous Infusions:  sodium chloride 250 mL (08/07/19 0547)   azithromycin 500 mg (08/07/19 0628)   cefTRIAXone (ROCEPHIN)  IV 2 g (08/07/19 0550)     LOS: 1 day    Time spent: 35 mins     Wyvonnia Dusky, MD [facility] Pager [phone]  If 7PM-7AM, please contact night-coverage [email or web address] 08/07/2019, 8:34 AM

## 2019-08-07 NOTE — Progress Notes (Signed)
Report called to Derek Shelton

## 2019-08-07 NOTE — Progress Notes (Signed)
Received orders from dr Damita Dunnings to give metoprolol 2.5mg  IV for hr >120.

## 2019-08-07 NOTE — Evaluation (Signed)
Occupational Therapy Evaluation Patient Details Name: Derek Shelton MRN: [id number] DOB: March 26, 1940 Today's Date: 08/07/2019    History of Present Illness Derek Shelton is a 79 y.o. male with medical history significant for  COPD, diabetes, diastolic CHF EF 59% on 01/30/3844,, moderate to severe tricuspid regurgitation and moderate PAH followed by pulmonology, hypothyroidism, history of CVA, persistent A. fib on low-dose Eliquis due to recent history of GI bleed, and dementia, who presents to the emergency room with dyspnea on exertion that became acutely worse in the past several hours. In ED, Chest x-ray mild vascular congestion.  EKG showed junctional rhythm.  Mildly elevated LFTs.  Patient subsequently had CTA chest negative for acute PE but showing left lower lobe pneumonia.   Clinical Impression   Derek Shelton was seen for OT evaluation this date. [employer] utilized via phone t/o session (Interpreter ID # [id number]). Pt spouse also present at bedside t/o evaluation. Prior to hospital admission, pt used a 2WW for functional mobility. Per spouse, pt also requires at least some assistance for bathing, dressing, and toileting tasks. Pt lives with his spouse in a 1 level home with 4 STE. Currently pt demonstrates impairments as described below (See OT problem list) which functionally limit his ability to perform ADL/self-care tasks. Pt currently requires CGA for functional mobility, and fatigues quickly. He requires MOD A for LB ADL management in seated position to maximize safety and functional independence.  Pt would benefit from skilled OT services to address noted impairments and functional limitations (see below for any additional details) in order to maximize safety and independence while minimizing falls risk and caregiver burden. Upon hospital discharge, recommend HHOT to maximize pt safety and return to functional independence during meaningful occupations of daily  life.      Follow Up Recommendations  Home health OT;Supervision/Assistance - 24 hour    Equipment Recommendations  3 in 1 bedside commode    Recommendations for Other Services       Precautions / Restrictions Precautions Precautions: Fall Precaution Comments: High Fall; Monitor HR Restrictions Weight Bearing Restrictions: No      Mobility Bed Mobility Overal bed mobility: Modified Independent             General bed mobility comments: Pt comes to sitting with HOB elevated. Mod increased time/effort to perform.  Transfers Overall transfer level: Needs assistance Equipment used: Rolling walker (2 wheeled) Transfers: Sit to/from Stand Sit to Stand: Min guard         General transfer comment: Min guard during STS from EOB and side-stepping at EOB.    Balance Overall balance assessment: Needs assistance Sitting-balance support: Feet supported;Single extremity supported;No upper extremity supported Sitting balance-Leahy Scale: Good Sitting balance - Comments: Steady static sitting at EOB.   Standing balance support: During functional activity;Bilateral upper extremity supported Standing balance-Leahy Scale: Fair Standing balance comment: Requires BUE support on RW to maintain static standing balance.                           ADL either performed or assessed with clinical judgement   ADL Overall ADL's : Needs assistance/impaired                                       General ADL Comments: Pt functionally limited by generalized weakness, decreased cognition, and cardiopulmonary status. Per wife at  bedside, requires at least MIN A for LB ADL management at baseline. Currently anticipate MOD-MAX A for LB ADL management including dressing and bathing. CGA for functional mobility/transfers.     Vision         Perception     Praxis      Pertinent Vitals/Pain Pain Assessment: No/denies pain     Hand Dominance Left    Extremity/Trunk Assessment Upper Extremity Assessment Upper Extremity Assessment: Generalized weakness   Lower Extremity Assessment Lower Extremity Assessment: Generalized weakness       Communication Communication Communication: Prefers language other than [place] (Freight forwarder.)   Cognition Arousal/Alertness: Awake/alert Behavior During Therapy: WFL for tasks assessed/performed;Flat affect Overall Cognitive Status: History of cognitive impairments - at baseline                                 General Comments: Pt oriented to self and place only. Per wife at bedside this is baseline. Hx of dementia.   General Comments  Pt vitals monitored during session. HR noted to remain 100-106 with seated activity. Pt stands and performs side-stepping at EOB with HR stable in low 110's. Once pt returns to supine in bed, HR noted to reach 120 bpm. Improves with therapeutic rest break and cues for PLB/relaxation techniques. RN notified.    Exercises Other Exercises Other Exercises: Pt/caregiver (wife present at bedside) educated on falls prevention strategies, safe use of AE/DME for ADL management, safe transfer/bed mobility techniques, and energy conservation strategies including pursed lip breathing.   Shoulder Instructions      Home Living Family/patient expects to be discharged to:: Private residence Living Arrangements: Spouse/significant other;Children Available Help at Discharge: Family;Available 24 hours/day Type of Home: House Home Access: Stairs to enter [employer] of Steps: 4 STE Entrance Stairs-Rails: Left Home Layout: One level     Bathroom Shower/Tub: Teacher, early years/pre: Standard     Home Equipment: Environmental consultant - 2 wheels;Wheelchair - manual;Cane - single point          Prior Functioning/Environment Level of Independence: Needs assistance  Gait / Transfers Assistance Needed: Per wife at bedside, pt uses a RW for functional  mobility in the home. ADL's / Homemaking Assistance Needed: Wife states pt requires assist with most ADL management including bathing, dressing, and toileting. No falls hx in past 6 months.            OT Problem List: Decreased strength;Decreased coordination;Cardiopulmonary status limiting activity;Decreased activity tolerance;Decreased safety awareness;Impaired balance (sitting and/or standing);Decreased knowledge of use of DME or AE;Decreased cognition      OT Treatment/Interventions: Self-care/ADL training;Therapeutic exercise;Therapeutic activities;DME and/or AE instruction;Patient/family education;Balance training;Energy conservation    OT Goals(Current goals can be found in the care plan section) Acute Rehab OT Goals Patient Stated Goal: To go home OT Goal Formulation: With patient/family Time For Goal Achievement: 08/21/19 Potential to Achieve Goals: Good ADL Goals Pt Will Perform Grooming: sitting;with set-up;with supervision;with caregiver independent in assisting Pt Will Perform Upper Body Dressing: sitting;with supervision;with set-up;with caregiver independent in assisting Pt Will Perform Lower Body Dressing: sit to/from stand;with min assist;with caregiver independent in assisting;with adaptive equipment (c LRAD PRN for improved safety) Pt Will Transfer to Toilet: ambulating;bedside commode;with supervision;with set-up (c LRAD PRN for improved safety) Pt Will Perform Toileting - Clothing Manipulation and hygiene: sit to/from stand;with supervision;with set-up;with caregiver independent in assisting;with adaptive equipment (c LRAD PRN for improved safety)  OT Frequency: Min  2X/week   Barriers to D/C:            Co-evaluation              AM-PAC OT "6 Clicks" Daily Activity     Outcome Measure Help from another person eating meals?: None Help from another person taking care of personal grooming?: A Little Help from another person toileting, which includes using  toliet, bedpan, or urinal?: A Lot Help from another person bathing (including washing, rinsing, drying)?: A Lot Help from another person to put on and taking off regular upper body clothing?: A Little Help from another person to put on and taking off regular lower body clothing?: A Lot 6 Click Score: 16   End of Session Equipment Utilized During Treatment: Gait belt;Rolling walker Nurse Communication: Mobility status;Other (comment) (Pt HR during session.)  Activity Tolerance: Patient tolerated treatment well Patient left: in bed;with call bell/phone within reach;with bed alarm set;with family/visitor present  OT Visit Diagnosis: Other abnormalities of gait and mobility (R26.89);Muscle weakness (generalized) (M62.81)                Time: 5945-8592 OT Time Calculation (min): 36 min Charges:  OT General Charges $OT Visit: 1 Visit OT Evaluation $OT Eval Moderate Complexity: 1 Mod OT Treatments $Self Care/Home Management : 23-37 mins  Shara Blazing, M.S., OTR/L Ascom: [phone] 08/07/19, 3:07 PM

## 2019-08-07 NOTE — Progress Notes (Signed)
Pt tachy. HR 110's-140's, Notified NP E. Ouma. No new orders at this moment due to pt's history of severe bradycardia

## 2019-08-07 NOTE — Progress Notes (Signed)
Patient resting in bed, son at bedside. Bipap on standby. Patient on 2L [place], SAT at 100%. No distress noted. Will continue to monitor.

## 2019-08-08 LAB — GLUCOSE, CAPILLARY
Glucose-Capillary: 182 mg/dL — ABNORMAL HIGH (ref 70–99)
Glucose-Capillary: 172 mg/dL — ABNORMAL HIGH (ref 70–99)
Glucose-Capillary: 143 mg/dL — ABNORMAL HIGH (ref 70–99)
Glucose-Capillary: 194 mg/dL — ABNORMAL HIGH (ref 70–99)

## 2019-08-08 LAB — BASIC METABOLIC PANEL
Anion gap: 11 (ref 5–15)
BUN: 28 mg/dL — ABNORMAL HIGH (ref 8–23)
CO2: 29 mmol/L (ref 22–32)
Calcium: 8.7 mg/dL — ABNORMAL LOW (ref 8.9–10.3)
Chloride: 99 mmol/L (ref 98–111)
Creatinine, Ser: 1.2 mg/dL (ref 0.61–1.24)
GFR calc Af Amer: >60 mL/min (ref >60)
GFR calc non Af Amer: 57 mL/min — ABNORMAL LOW (ref >60)
Glucose, Bld: 175 mg/dL — ABNORMAL HIGH (ref 70–99)
Potassium: 3.7 mmol/L (ref 3.5–5.1)
Sodium: 139 mmol/L (ref 135–145)

## 2019-08-08 LAB — CBC
HCT: 35.6 % — ABNORMAL LOW (ref 39.0–52.0)
Hemoglobin: 11.3 g/dL — ABNORMAL LOW (ref 13.0–17.0)
MCH: 27 pg (ref 26.0–34.0)
MCHC: 31.7 g/dL (ref 30.0–36.0)
MCV: 85.2 fL (ref 80.0–100.0)
Platelets: 218 10*3/uL (ref 150–400)
RBC: 4.18 MIL/uL — ABNORMAL LOW (ref 4.22–5.81)
RDW: 26 % — ABNORMAL HIGH (ref 11.5–15.5)
WBC: 10.8 10*3/uL — ABNORMAL HIGH (ref 4.0–10.5)
nRBC: 0 % (ref 0.0–0.2)

## 2019-08-08 MED ORDER — DIGOXIN 0.25 MG/ML IJ SOLN
0.2500 mg | INTRAMUSCULAR | Status: AC
  Administered 2019-08-08: 0.25 mg via INTRAVENOUS
  Filled 2019-08-08: qty 2

## 2019-08-08 MED ORDER — INSULIN ASPART 100 UNIT/ML ~~LOC~~ SOLN
0.0000 [IU] | Freq: Three times a day (TID) | SUBCUTANEOUS | Status: DC
  Administered 2019-08-08: 2 [IU] via SUBCUTANEOUS
  Administered 2019-08-08 – 2019-08-09 (×2): 3 [IU] via SUBCUTANEOUS
  Administered 2019-08-09: 5 [IU] via SUBCUTANEOUS
  Administered 2019-08-09: 3 [IU] via SUBCUTANEOUS
  Administered 2019-08-10: 5 [IU] via SUBCUTANEOUS
  Administered 2019-08-10 – 2019-08-11 (×4): 3 [IU] via SUBCUTANEOUS
  Filled 2019-08-08 (×11): qty 1

## 2019-08-08 MED ORDER — DIGOXIN 0.25 MG/ML IJ SOLN: 0.2500 mg | Freq: Once | INTRAMUSCULAR | Status: DC

## 2019-08-08 MED ORDER — IPRATROPIUM BROMIDE 0.02 % IN SOLN
0.5000 mg | Freq: Four times a day (QID) | RESPIRATORY_TRACT | Status: DC
  Administered 2019-08-08 (×2): 0.5 mg via RESPIRATORY_TRACT
  Filled 2019-08-08 (×2): qty 2.5

## 2019-08-08 MED ORDER — MELATONIN 5 MG PO TABS
5.0000 mg | ORAL_TABLET | Freq: Every day | ORAL | Status: DC
  Administered 2019-08-09 (×2): 5 mg via ORAL
  Filled 2019-08-08 (×3): qty 1

## 2019-08-08 MED ORDER — DIGOXIN 0.25 MG/ML IJ SOLN
0.2500 mg | Freq: Every day | INTRAMUSCULAR | Status: DC | PRN
  Administered 2019-08-08 – 2019-08-10 (×2): 0.25 mg via INTRAVENOUS
  Filled 2019-08-08 (×2): qty 2

## 2019-08-08 MED ORDER — IPRATROPIUM-ALBUTEROL 0.5-2.5 (3) MG/3ML IN SOLN: 3.0000 mL | Freq: Four times a day (QID) | RESPIRATORY_TRACT | Status: DC

## 2019-08-08 NOTE — Plan of Care (Signed)
Pt's HR was a-fib with RVR 120-150's this AM with low BP; otherwise asymptomatic.  Discussed situation with MD and cardiologist. Morning dose of metoprolol given since MAP >65.  Digoxin 250 mcg IV given with good effect.  A-fib with controlled RVR 90 to low 100's.  Audible expiratory wheezing noted upon assessment.  Obtained order for neb treatment.  Given, and wheezing has resolved.   Problem: Clinical Measurements: Goal: Ability to maintain clinical measurements within normal limits will improve 08/08/2019 1353 by Aubery Lapping, RN Outcome: Not Progressing 08/08/2019 1353 by Aubery Lapping, RN Outcome: Not Progressing Goal: Respiratory complications will improve 08/08/2019 1353 by Aubery Lapping, RN Outcome: Not Progressing 08/08/2019 1353 by Aubery Lapping, RN Outcome: Not Progressing Goal: Cardiovascular complication will be avoided 08/08/2019 1353 by Aubery Lapping, RN Outcome: Not Progressing 08/08/2019 1353 by Aubery Lapping, RN Outcome: Not Progressing

## 2019-08-08 NOTE — Progress Notes (Signed)
   08/08/19 1022  Assess: MEWS Score  BP 93/80  Pulse Rate (!) 141  ECG Heart Rate (!) 138  Resp 15  SpO2 95 %  Assess: MEWS Score  MEWS Temp 0  MEWS Systolic 1  MEWS Pulse 3  MEWS RR 0  MEWS LOC 0  MEWS Score 4  MEWS Score Color Red  Assess: if the MEWS score is Yellow or Red  Were vital signs taken at a resting state? Yes  Focused Assessment Documented focused assessment  Early Detection of Sepsis Score *See Row Information* High  MEWS guidelines implemented *See Row Information* Yes  Treat  MEWS Interventions Administered scheduled meds/treatments;Escalated (See documentation below)  Take Vital Signs  Increase Vital Sign Frequency  Red: Q 1hr X 4 then Q 4hr X 4, if remains red, continue Q 4hrs  Escalate  MEWS: Escalate Red: discuss with charge nurse/RN and provider, consider discussing with RRT  Notify: Charge Nurse/RN  Name of Charge Nurse/RN Notified Linard Millers, RN  Date Charge Nurse/RN Notified 08/08/19  Time Charge Nurse/RN Notified 0630  Notify: Provider  Provider Name/Title Dr. Jimmye Norman  Date Provider Notified 08/08/19  Time Provider Notified 1026  Notification Type Face-to-face  Notification Reason Change in status  Response See new orders  Date of Provider Response 08/08/19  Time of Provider Response 1026  Document  Patient Outcome Stabilized after interventions  Progress note created (see row info) Yes

## 2019-08-08 NOTE — Consult Note (Signed)
CARDIOLOGY CONSULT NOTE               Patient ID: Derek Shelton MRN: [id number] DOB/AGE: September 30, 1940 79 y.o.  Admit date: 08/06/2019 Referring Physician Jimmye Norman Primary Physician [facility] Primary Cardiologist Paraschos Reason for Consultation atrial fibrillation with RVR  HPI: 79 year old gentleman referred for evaluation of atrial fibrillation with RVR. The patient has a history of persistent atrial fibrillation, on low dose Eliquis, chronic diastolic CHF, COPD, CVA, dementia, essential hypertension, pulmonary hypertension, type 2 diabetes, and GI bleed 06/2019.  The patient has had progressively worsening exertional shortness of breath for the last several months. On the morning of 08/06/2019, the patient developed an acute bout of coughing and progressively worsening shortness of breath. In the ER, the patient was dyspneic with oxygen saturation 91%, and chest CT revealed left lower lobe pneumonia, cardiomegaly without CHF. ECG revealed junctional rhythm at a rate of 74 bpm with RBBB. Admission labs notable for WBC 15.9, BUN 26, creatinine 1.10, flat high sensitivity troponin (18, 26), BNP 260 (decreased from 397 one month ago), venous blood gas pH 7.48, PCO2 40, AST 45, total bilirubin 2.0. The patient was started on antibiotics and bronchodilators. The patient developed atrial fibrillation with RVR with rates 120-140s and was given a dose of IV digoxin. Blood pressure was low with systolic blood pressure in the 90s. The patient appeared asymptomatic. His rate improved to 80-90s. Currently, the patient states "everything is fine," and denies chest pain or palpitations. His lower extremity edema is also much improved, per the wife at bedside.    Of note, the patient was originally referred to cardiology for evaluation for possible permanent pacemaker on 04/02/3019 with history of alternating bradycardia and tachycardia. He was on metoprolol tartrate and digoxin for rate control at the time,  which were discontinued during recent hospitalization due to bradycardia. Metoprolol was later resumed due to episodes of tachycardia. Most recent 1-week Holter monitor revealed predominant sinus bradycardia with mean heart rate of 57 bpm. Minimum heart rate was 41 bpm.  2D echocardiogram 04/29/2019 revealed normal left ventricular function, with LVEF greater than 55%, moderate to severe tricuspid regurgitation, and moderate pulmonary hypertension with estimated RVSP of 64 mmHg. Permanent pacemaker has been deferred. Upon review of telemetry strips from admission in 06/2019, the patient had 4 and 6 second pauses while on metoprolol and digoxin, though appeared asymptomatic.    Review of systems complete and found to be negative unless listed above     Past Medical History:  Diagnosis Date  . Anemia   . Asthma   . CHF (congestive heart failure) ([place])   . COPD (chronic obstructive pulmonary disease) ([place])   . Coronary artery disease   . Dementia ([place])    Per son's report  . Diabetes mellitus without complication ([place])   . Dysrhythmia    Atrial Fibrillation  . GERD (gastroesophageal reflux disease)   . GI bleed   . Hyperlipidemia   . Hypertension   . Hypothyroidism   . Shortness of breath dyspnea   . Sleep apnea   . Stroke ([place])   . Thyroid disease     Past Surgical History:  Procedure Laterality Date  . CARDIAC CATHETERIZATION    . COLONOSCOPY N/A 08/10/2014   Procedure: COLONOSCOPY;  Surgeon: Manya Silvas, MD;  Location: [facility] ENDOSCOPY;  Service: Endoscopy;  Laterality: N/A;  . COLONOSCOPY WITH PROPOFOL N/A 04/05/2016   Procedure: COLONOSCOPY WITH PROPOFOL;  Surgeon: Lollie Sails, MD;  Location: [facility] ENDOSCOPY;  Service: Endoscopy;  Laterality: N/A;  . COLONOSCOPY WITH PROPOFOL N/A 07/10/2019   Procedure: COLONOSCOPY WITH PROPOFOL;  Surgeon: Lucilla Lame, MD;  Location: [facility] ENDOSCOPY;  Service: Endoscopy;  Laterality: N/A;  . ESOPHAGOGASTRODUODENOSCOPY N/A 08/08/2014    Procedure: ESOPHAGOGASTRODUODENOSCOPY (EGD);  Surgeon: Manya Silvas, MD;  Location: [facility] ENDOSCOPY;  Service: Endoscopy;  Laterality: N/A;  plan for early afternoon  . ESOPHAGOGASTRODUODENOSCOPY (EGD) WITH PROPOFOL N/A 07/10/2019   Procedure: ESOPHAGOGASTRODUODENOSCOPY (EGD) WITH PROPOFOL;  Surgeon: Lucilla Lame, MD;  Location: [facility] ENDOSCOPY;  Service: Endoscopy;  Laterality: N/A;  . EYE SURGERY    . GIVENS CAPSULE STUDY N/A 08/12/2014   Procedure: GIVENS CAPSULE STUDY;  Surgeon: Manya Silvas, MD;  Location: [facility] ENDOSCOPY;  Service: Endoscopy;  Laterality: N/A;  . rectal fistula N/A     Medications Prior to Admission  Medication Sig Dispense Refill Last Dose  . apixaban (ELIQUIS) 2.5 MG TABS tablet Take 1 tablet (2.5 mg total) by mouth 2 (two) times daily. 60 tablet 1 08/05/2019 at Unknown time  . atorvastatin (LIPITOR) 40 MG tablet Take 1 tablet (40 mg total) by mouth daily at 6 PM. 30 tablet 1 08/05/2019 at Unknown time  . budesonide-formoterol (SYMBICORT) 160-4.5 MCG/ACT inhaler Inhale 2 puffs into the lungs 2 (two) times daily.   08/05/2019 at Unknown time  . donepezil (ARICEPT) 5 MG tablet Take 5 mg by mouth at bedtime.   08/05/2019 at Unknown time  . DULoxetine (CYMBALTA) 60 MG capsule Take 60 mg by mouth daily.   08/05/2019 at Unknown time  . ergocalciferol (VITAMIN D2) 1.25 MG (50000 UT) capsule Take 50,000 Units by mouth once a week.   08/05/2019 at Unknown time  . esomeprazole (NEXIUM) 40 MG capsule Take 40 mg by mouth daily at 12 noon.   08/05/2019 at Unknown time  . ferrous sulfate 325 (65 FE) MG EC tablet Take 1 tablet (325 mg total) by mouth 2 (two) times daily. (Patient taking differently: Take 325 mg by mouth daily. ) 60 tablet 3 08/05/2019 at Unknown time  . finasteride (PROSCAR) 5 MG tablet Take 1 tablet (5 mg total) by mouth daily. 90 tablet 3 08/05/2019 at Unknown time  . insulin degludec (TRESIBA FLEXTOUCH) 100 UNIT/ML SOPN FlexTouch Pen Inject 30 Units into the skin at  bedtime.    08/05/2019 at Unknown time  . ipratropium-albuterol (DUONEB) 0.5-2.5 (3) MG/3ML SOLN Inhale 3 mLs into the lungs every 6 (six) hours as needed for shortness of breath.  3 08/05/2019 at prn  . latanoprost (XALATAN) 0.005 % ophthalmic solution latanoprost 0.005 % eye drops  INSTILL ONE DROP TO BOTH EYES AT BEDTIME.   08/05/2019 at Unknown time  . levothyroxine (SYNTHROID, LEVOTHROID) 112 MCG tablet Take 112 mcg by mouth daily before breakfast.    08/05/2019 at Unknown time  . Melatonin 3 MG TABS Take 3 mg by mouth at bedtime as needed (sleep).    prn at prn  . metoprolol tartrate (LOPRESSOR) 25 MG tablet Take 25 mg by mouth 2 (two) times daily.    08/05/2019 at Unknown time  . omega-3 acid ethyl esters (LOVAZA) 1 G capsule Take 1 g by mouth daily.    08/05/2019 at Unknown time  . pantoprazole (PROTONIX) 40 MG tablet Take 40 mg by mouth daily.   08/05/2019 at Unknown time  . sildenafil (REVATIO) 20 MG tablet Take 20 mg by mouth 3 (three) times daily.   08/05/2019 at Unknown time  . tamsulosin (FLOMAX) 0.4 MG CAPS capsule Take 0.4  mg by mouth daily.   08/05/2019 at Unknown time  . tiotropium (SPIRIVA) 18 MCG inhalation capsule Place 18 mcg into inhaler and inhale daily.   08/05/2019 at Unknown time  . digoxin (LANOXIN) 0.125 MG tablet Take 1 tablet (0.125 mg total) by mouth daily. (Patient taking differently: Take 0.125 mg by mouth. TAKE ONCE A DAY EVERY OTHER DAY.)     . furosemide (LASIX) 20 MG tablet Take 40 mg by mouth daily.      . montelukast (SINGULAIR) 10 MG tablet Take 10 mg by mouth daily. (Patient not taking: Reported on 08/06/2019)   Not Taking at Unknown time  . potassium chloride SA (K-DUR,KLOR-CON) 20 MEQ tablet Take 10 mEq by mouth 3 (three) times a week. Take 1/2 tablets (10MEQ) by mouth twice daily for 3 days as directed Mon., Wed., Fri. (Patient not taking: Reported on 08/06/2019)   Not Taking at Unknown time   Social History   Socioeconomic History  . Marital status: Married     Spouse name: Not on file  . Number of children: Not on file  . Years of education: Not on file  . Highest education level: Not on file  Occupational History  . Not on file  Tobacco Use  . Smoking status: Former Smoker    Quit date: 2000    Years since quitting: 21.5  . Smokeless tobacco: Former Systems developer    Types: Secondary school teacher  . Vaping Use: Never used  Substance and Sexual Activity  . Alcohol use: No  . Drug use: No  . Sexual activity: Not on file  Other Topics Concern  . Not on file  Social History Narrative  . Not on file   Social Determinants of Health   Financial Resource Strain:   . Difficulty of Paying Living Expenses:   Food Insecurity:   . Worried About Charity fundraiser in the Last Year:   . Arboriculturist in the Last Year:   Transportation Needs:   . Film/video editor (Medical):   Marland Kitchen Lack of Transportation (Non-Medical):   Physical Activity:   . Days of Exercise per Week:   . Minutes of Exercise per Session:   Stress:   . Feeling of Stress :   Social Connections:   . Frequency of Communication with Friends and Family:   . Frequency of Social Gatherings with Friends and Family:   . Attends Religious Services:   . Active Member of Clubs or Organizations:   . Attends Archivist Meetings:   Marland Kitchen Marital Status:   Intimate Partner Violence:   . Fear of Current or Ex-Partner:   . Emotionally Abused:   Marland Kitchen Physically Abused:   . Sexually Abused:     Family History  Problem Relation Age of Onset  . Diabetes Mother       Review of systems complete and found to be negative unless listed above      PHYSICAL EXAM  General: Well developed, well nourished, elderly gentleman, sitting up in bed, in no acute distress, appears comfortable. HEENT:  Normocephalic and atramatic Neck:  No JVD.  Lungs: mild bibasilar crackles, diminished breath sounds left lower lobe Heart: irregular irregular . Normal S1 and S2 without gallops or murmurs.  Abdomen:  no obvious distention Msk:  Back normal, gait not assessed. No obvious deformities. Extremities: No clubbing, cyanosis or edema.   Neuro: Alert and oriented X 3. Psych:  Good affect, responds appropriately  Labs:  Lab Results  Component Value Date   WBC 10.8 (H) 08/08/2019   HGB 11.3 (L) 08/08/2019   HCT 35.6 (L) 08/08/2019   MCV 85.2 08/08/2019   PLT 218 08/08/2019    Recent Labs  Lab 08/06/19 0037 08/07/19 0530 08/08/19 0418  NA 135   < > 139  K 4.4   < > 3.7  CL 96*   < > 99  CO2 28   < > 29  BUN 16   < > 28*  CREATININE 1.00   < > 1.20  CALCIUM 9.1   < > 8.7*  PROT 8.8*  --   --   BILITOT 2.0*  --   --   ALKPHOS 99  --   --   ALT 19  --   --   AST 45*  --   --   GLUCOSE 149*   < > 175*   < > = values in this interval not displayed.   Lab Results  Component Value Date   FWYOVZC 588 (H) 05/06/2014   CKMB 11.9 (H) 05/07/2014   TROPONINI <0.03 12/02/2017    Lab Results  Component Value Date   CHOL 159 12/03/2017   Lab Results  Component Value Date   HDL 32 (L) 12/03/2017   Lab Results  Component Value Date   LDLCALC 110 (H) 12/03/2017   Lab Results  Component Value Date   TRIG 84 12/03/2017   Lab Results  Component Value Date   CHOLHDL 5.0 12/03/2017   No results found for: LDLDIRECT    Radiology: CT Head Wo Contrast  Result Date: 07/16/2019 CLINICAL DATA:  Head trauma headache EXAM: CT HEAD WITHOUT CONTRAST TECHNIQUE: Contiguous axial images were obtained from the base of the skull through the vertex without intravenous contrast. COMPARISON:  CT brain 12/03/2018, MRI 12/06/2017, CT 08/05/2004 FINDINGS: Brain: No acute territorial infarction, hemorrhage, or new intracranial mass. Left middle cranial fossa meningioma again noted. Atrophy and mild chronic small vessel ischemic change of the white matter. Stable ventricle size. Vascular: No hyperdense vessels.  Carotid vascular calcification Skull: No fracture. Heterogeneous lucency at the left central  skull base, likely due to bony remodeling from meningioma. Sinuses/Orbits: Opacified right maxillary sinus. Mucosal thickening in the ethmoid sinuses. Other: None IMPRESSION: 1. No CT evidence for acute intracranial abnormality. Atrophy and chronic small vessel ischemic change of the white matter 2. 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 Electronically Signed   By: Donavan Foil M.D.   On: 07/16/2019 21:31   CT Angio Chest PE W and/or Wo Contrast  Result Date: 08/06/2019 CLINICAL DATA:  Shortness of breath for 1 day. EXAM: CT ANGIOGRAPHY CHEST WITH CONTRAST TECHNIQUE: Multidetector CT imaging of the chest was performed using the standard protocol during bolus administration of intravenous contrast. Multiplanar CT image reconstructions and MIPs were obtained to evaluate the vascular anatomy. CONTRAST:  32mL OMNIPAQUE IOHEXOL 350 MG/ML SOLN COMPARISON:  Chest x-ray from earlier today FINDINGS: Cardiovascular: Enlarged heart. No pericardial effusion. Limited assessment of the pulmonary arteries due to motion primarily. No visible pulmonary embolism. Diffuse atherosclerotic calcification of the aorta and multifocal coronary atherosclerosis. Mediastinum/Nodes: Negative for adenopathy or mass. Lungs/Pleura: Left lower lobe consolidation. Milder airspace disease in the superior segment right lower lobe. No edema, effusion, or pneumothorax. Motion artifact. Tracheomalacia. Upper Abdomen: No acute finding Musculoskeletal: Spondylosis with multilevel thoracic ankylosis. Review of the MIP images confirms the above findings. IMPRESSION: 1. Left lower lobe  pneumonia. 2. Cardiomegaly without failure. 3. As permitted by moderate respiratory motion, no evidence of pulmonary embolism. Electronically Signed   By: Monte Fantasia M.D.   On: 08/06/2019 04:57   DG Chest Port 1 View  Result Date: 08/06/2019 CLINICAL DATA:  79 year old male with dyspnea. EXAM: PORTABLE  CHEST 1 VIEW COMPARISON:  Chest radiograph dated 07/06/2019. FINDINGS: There is cardiomegaly with mild vascular congestion. No edema. No focal consolidation, pleural effusion, or pneumothorax. Atherosclerotic calcification of the aorta. No acute osseous pathology. Old healed right clavicular fracture. IMPRESSION: Cardiomegaly with mild vascular congestion. No focal consolidation. Electronically Signed   By: Anner Crete M.D.   On: 08/06/2019 00:50    EKG: atrial fibrillation, rate 89-90s  ASSESSMENT AND PLAN:   1. Atrial fibrillation with RVR, with known persistent atrial fibrillation, on low dose Eliquis for stroke prevention, developed rapid ventricular rate in the setting of pneumonia with improvement of rate with one dose of IV digoxin. 2. Acute on chronic diastolic CHF, improving with IV Lasix 3. Acute respiratory failure with hypoxia, secondary to LLL Pneumonia 4. History of bradycardia with recent Holter monitor revealing mean heart rate of 57 bpm. Review of previous telemetry strips during last admission in 06/2019 revealed 4 and 6 second pauses while patient was on metoprolol and digoxin. Now on metoprolol tartrate 25 mg BID for rate control. Pacemaker has been deferred at this time.  5. COPD, followed by pulmonary 6. Pulmonary hypertension, followed by pulmonary 7. History of GI bleed in 06/2019, hemoglobin and hematocrit currently stable while on low dose Eliquis 8. Dementia, patient able to answer some questions, but relies on family members for most of history  Recommendations: 1. Agree with current therapy 2. Treatment of pneumonia per hospitalist 3. Continue low dose Eliquis for stroke prevention with careful monitoring for recurrent GI bleed 4. Continue metoprolol tartrate 25 mg BID for rate control. Defer aggressive rate control due to history of bradycardia and pauses. 5. PRN IV digoxin for persistent tachycardia with rate >120 bpm 6. Continue IV Lasix with careful  monitoring of renal status 7. Defer permanent pacemaker at this time. Will reassess necessity as outpatient when pneumonia resolves.  *Spoke with patient's son (Ovas) on the phone to update.  Plan made in collaboration with Dr. Saralyn Pilar.  Signed: Clabe Seal PA-C 08/08/2019, 12:25 PM

## 2019-08-08 NOTE — Evaluation (Addendum)
Physical Therapy Evaluation Patient Details Name: DEZMAN GRANDA MRN: [id number] DOB: Nov 06, 1940 Today's Date: 08/08/2019   History of Present Illness  Patient is an 79 y.o. male with medical history significant for  COPD, diabetes, diastolic CHF EF 95% on 01/31/8414, moderate to severe tricuspid regurgitation and moderate PAH followed by pulmonology, hypothyroidism, history of CVA, persistent A. fib on low-dose Eliquis due to recent history of GI bleed, and dementia, who presents to the emergency room with dyspnea on exertion that became acutely worse in the past several hours. In ED, Chest x-ray mild vascular congestion.  EKG showed junctional rhythm.  Mildly elevated LFTs.  Patient subsequently had CTA chest negative for acute PE but showing left lower lobe pneumonia.  Clinical Impression  PT evaluation completed using Freight forward[employer] services (reference number [id number]). Patient perseverative on waking spouse at bedside and needs increased time to complete tasks. Patient ambulated in room with supervision using rolling walker with heart rate increasing briefly into 150-160's with mobility. With rest, heart rate decreased into 90's. For safety and due to increased heart rate, did not progress ambulating into hallway. Patient able to participate with bed level exercises without significant change in heart rate after walking. Recommend HHPT at discharge, and patient appears to have rolling walker at home already. PT will continue to follow to maximize function and safety in preparation for discharge home with family support.     Follow Up Recommendations Home health PT    Equipment Recommendations  None recommended by PT    Recommendations for Other Services       Precautions / Restrictions Precautions Precautions: Fall Precaution Comments: High Fall; Monitor HR Restrictions Weight Bearing Restrictions: No      Mobility  Bed Mobility Overal bed mobility: Modified Independent              General bed mobility comments: with  head of bed elevated at 30 degrees. no use of bed rails. cues for task initiation   Transfers Overall transfer level: Needs assistance Equipment used: Rolling walker (2 wheeled) Transfers: Sit to/from Stand Sit to Stand: Supervision         General transfer comment: Supervision for safety   Ambulation/Gait Ambulation/Gait assistance: Supervision Gait Distance (Feet): 30 Feet Assistive device: Rolling walker (2 wheeled) Gait Pattern/deviations: Step-through pattern Gait velocity: decreased    General Gait Details: Patient ambulated in room using rolling walker. Did not progress ambulation further as patient's heart rate noted intermittently increasing into the 150's- 160's with activity. No dizziness or other symptoms reported with activity other that patient stated he felt fatigued after activity.   Stairs            Wheelchair Mobility    Modified Rankin (Stroke Patients Only)       Balance Overall balance assessment: Needs assistance Sitting-balance support: Single extremity supported;No upper extremity supported Sitting balance-Leahy Scale: Good Sitting balance - Comments: static and dynamic sitting balance    Standing balance support: During functional activity;Bilateral upper extremity supported Standing balance-Leahy Scale: Fair Standing balance comment: with UE supported on rolling walker                              Pertinent Vitals/Pain Pain Assessment: No/denies pain    Home Living Family/patient expects to be discharged to:: Private residence Living Arrangements: Spouse/significant other;Children Available Help at Discharge: Family;Available 24 hours/day Type of Home: House Home Access: Stairs to enter Entrance Stairs-Rails: Left  Entrance Stairs-Number of Steps: 4 STE Home Layout: One level Home Equipment: Walker - 2 wheels;Wheelchair - manual;Cane - single point Additional Comments:  information gathered per chart review     Prior Function Level of Independence: Needs assistance   Gait / Transfers Assistance Needed: Patient reports he intermittently uses rolling walker at home   ADL's / Homemaking Assistance Needed: Wife states pt requires assist with most ADL management including bathing, dressing, and toileting. No falls hx in past 6 months.        Hand Dominance        Extremity/Trunk Assessment   Upper Extremity Assessment Upper Extremity Assessment: Generalized weakness    Lower Extremity Assessment Lower Extremity Assessment: Generalized weakness       Communication   Communication: Prefers language other than [place] (Urdu language interpreter used )  Cognition Arousal/Alertness: Awake/alert Behavior During Therapy: WFL for tasks assessed/performed Overall Cognitive Status: History of cognitive impairments - at baseline                                 General Comments: Patient required repeated instructions at times. Patient's spouse sleeping in recliner chair during most of session- patient seems perseverative on attempting to wake her up.       General Comments      Exercises General Exercises - Lower Extremity Ankle Circles/Pumps: AROM;Both;10 reps;Supine;Strengthening Heel Slides: AROM;Strengthening;Both;10 reps;Supine Hip ABduction/ADduction: AROM;Strengthening;Both;10 reps;Supine Straight Leg Raises: AROM;Strengthening;Both;10 reps;Supine Other Exercises Other Exercises: Verbal and visual cues for technique to complete strengthening exercises. Patient is able to count out loud to 10 in English with each exercise.    Assessment/Plan    PT Assessment Patient needs continued PT services  PT Problem List Decreased strength;Decreased activity tolerance;Cardiopulmonary status limiting activity;Decreased knowledge of use of DME;Decreased mobility;Decreased balance;Decreased safety awareness       PT Treatment  Interventions DME instruction;Gait training;Functional mobility training;Balance training;Neuromuscular re-education;Patient/family education;Therapeutic exercise;Therapeutic activities    PT Goals (Current goals can be found in the Care Plan section)  Acute Rehab PT Goals Patient Stated Goal: to go home  PT Goal Formulation: With patient Time For Goal Achievement: 08/22/19 Potential to Achieve Goals: Good    Frequency Min 2X/week   Barriers to discharge        Co-evaluation               AM-PAC PT "6 Clicks" Mobility  Outcome Measure Help needed turning from your back to your side while in a flat bed without using bedrails?: A Little Help needed moving from lying on your back to sitting on the side of a flat bed without using bedrails?: A Little Help needed moving to and from a bed to a chair (including a wheelchair)?: A Little Help needed standing up from a chair using your arms (e.g., wheelchair or bedside chair)?: A Little Help needed to walk in hospital room?: A Little Help needed climbing 3-5 steps with a railing? : A Little 6 Click Score: 18    End of Session Equipment Utilized During Treatment: Gait belt Activity Tolerance: Patient tolerated treatment well;Patient limited by fatigue Patient left: in bed;with call bell/phone within reach;with bed alarm set;with family/visitor present Nurse Communication: Mobility status (white board updated ) PT Visit Diagnosis: Unsteadiness on feet (R26.81);Muscle weakness (generalized) (M62.81)    Time: 4503-8882 PT Time Calculation (min) (ACUTE ONLY): 35 min   Charges:   PT Evaluation $PT Eval Moderate Complexity: 1 Mod  PT Treatments $Therapeutic Exercise: 8-22 mins        Minna Merritts, PT, MPT   Percell Locus 08/08/2019, 10:48 AM

## 2019-08-08 NOTE — Progress Notes (Signed)
PROGRESS NOTE    Derek Shelton  ZJI:[id number] DOB: [birth year] DOA: 08/06/2019 PCP: Gladstone Lighter, MD    Assessment & Plan:   Principal Problem:   Acute respiratory failure with hypoxia ([place]) Active Problems:   GI bleeding   Type 2 diabetes mellitus without complication (HCC)   OSA (obstructive sleep apnea)   Atrial fibrillation, chronic (HCC)   Dementia without behavioral disturbance (HCC)   Acute on chronic diastolic CHF (congestive heart failure) (HCC)   History of GI bleed   History of bradycardia   Moderate tricuspid regurgitation   PAH (pulmonary artery hypertension) ([place])   History of CVA (cerebrovascular accident)   LLL pneumonia   HCAP (healthcare-associated pneumonia)   Acute CHF (congestive heart failure) ([place])   Acute respiratory failure with hypoxia: initially requiring BiPAP with no prior history of O2 use but since has been weaned off. CTA chest negative for PE but shows left lower lobe pneumonia. Resolved   Left lower lobe pneumonia: possible HCAP. Continue on IV rocephin, azithromycin.  Continue w/ bronchodilators.Weaned off of supplemental oxygen. Encourage incentive spirometry   Leukocytosis: secondary to infection. Trending down Continue on IV abxs    Acute on chronic diastolic CHF: last echo in April 2021 showed EF 55%, moderate to severe tricuspid regurgitation. Continue on IV Lasix. Monitor I/Os. Cardio following    Chronic atrial fibrillation: w/ RVR intermittently so will continue metoprolol. IV digoxin x 1. Cardio consulted. Continue low-dose Eliquis 2.5 mg twice daily(dose was reduced following recent GI bleed in June 2021). Continue on tele   Dementia without behavioral disturbance: re-orient prn   History of GI bleed June 2021: was hospitalized in June 2021 with symptomatic anemia presenting with shortness of breath, hemoglobin 6.2 and received 2 units packed red blood cells.  Awaiting capsule endoscopy next month and is being followed by  GI.  History of bradycardia: had recent Holter monitor which revealed predominant sinus bradycardia with mean heart rate of 57 bpm. Minimum heart rate was 41 bpm. Considerations for future pacemaker placement outpatient as per cardio  Moderate tricuspid regurgitation: management as per cardio   Pulmonary artery hypertension: continue on home dose of sildenafil. Followed by pulmonology outpatient   History of CVA : continue on eliquis and statins  DM2: continue on SSI   Hypothyroidism: continue on home dose of levothyroxine  Depression: severity unknown. Continue on home dose of duloxetine   GERD: continue on home dose of PPI  BPH: continue on home dose of tamsulosin, finasteride  HLD: continue on statin   Hypokalemia: WNL today. Will continue to monitor    DVT prophylaxis:  eliquis  Code Status: full  Family Communication: discussed pt's care w/ pt's wife who is at bedside and answered her questions  Disposition Plan:  Likely d/c home w/ HH. Home health orders placed  Status is: Inpatient  Remains inpatient appropriate because:Hemodynamically unstable (a. fib w/ RVR), still requiring IV abxs   Dispo: The patient is from: Home              Anticipated d/c is to: Home w/ home health              Anticipated d/c date is: 2 days              Patient currently is not medically stable to d/c.     Consultants:      Procedures:    Antimicrobials: ceftriaxone, azithromycin     Subjective: Pt c/o fatigue   Objective: Vitals:  08/07/19 2034 08/07/19 2252 08/08/19 0605 08/08/19 0742  BP: 99/67 (!) 107/95 109/74 93/69  Pulse: 84  66 78  Resp:    18  Temp: 98.2 F (36.8 C)  98.1 F (36.7 C) 98 F (36.7 C)  TempSrc: Oral     SpO2: 96%  98% 95%  Weight:   82.2 kg   Height:        Intake/Output Summary (Last 24 hours) at 08/08/2019 0812 Last data filed at 08/08/2019 0625 Gross per 24 hour  Intake 454.37 ml  Output 1800 ml  Net -1345.63 ml   Filed  Weights   08/06/19 0020 08/08/19 0605  Weight: 95 kg 82.2 kg    Examination:  General exam: Appears calm and comfortable  Respiratory system: decreased breath sounds b/l. No rhonchi  Cardiovascular system: irregularly irregular. No  rubs, gallops or clicks.  Gastrointestinal system: Abdomen is obese, soft and nontender.  Hypoactive bowel sounds heard. Central nervous system: Alert and oriented. Moves all 4 extremities Psychiatry: Judgement and insight appear normal. Flat mood and affect     Data Reviewed: I have personally reviewed following labs and imaging studies  CBC: Recent Labs  Lab 08/06/19 0037 08/07/19 0530 08/08/19 0418  WBC 15.9* 14.3* 10.8*  NEUTROABS 13.5* 12.1*  --   HGB 10.2* 11.0* 11.3*  HCT 32.6* 33.4* 35.6*  MCV 85.3 82.3 85.2  PLT 194 203 497   Basic Metabolic Panel: Recent Labs  Lab 08/06/19 0037 08/07/19 0530 08/08/19 0418  NA 135 139 139  K 4.4 3.3* 3.7  CL 96* 99 99  CO2 28 30 29   GLUCOSE 149* 211* 175*  BUN 16 26* 28*  CREATININE 1.00 1.10 1.20  CALCIUM 9.1 8.8* 8.7*   GFR: Estimated Creatinine Clearance: 47.5 mL/min (by C-G formula based on SCr of 1.2 mg/dL). Liver Function Tests: Recent Labs  Lab 08/06/19 0037  AST 45*  ALT 19  ALKPHOS 99  BILITOT 2.0*  PROT 8.8*  ALBUMIN 4.4   No results for input(s): LIPASE, AMYLASE in the last 168 hours. No results for input(s): AMMONIA in the last 168 hours. Coagulation Profile: No results for input(s): INR, PROTIME in the last 168 hours. Cardiac Enzymes: No results for input(s): CKTOTAL, CKMB, CKMBINDEX, TROPONINI in the last 168 hours. BNP (last 3 results) No results for input(s): PROBNP in the last 8760 hours. HbA1C: No results for input(s): HGBA1C in the last 72 hours. CBG: No results for input(s): GLUCAP in the last 168 hours. Lipid Profile: No results for input(s): CHOL, HDL, LDLCALC, TRIG, CHOLHDL, LDLDIRECT in the last 72 hours. Thyroid Function Tests: No results for  input(s): TSH, T4TOTAL, FREET4, T3FREE, THYROIDAB in the last 72 hours. Anemia Panel: No results for input(s): VITAMINB12, FOLATE, FERRITIN, TIBC, IRON, RETICCTPCT in the last 72 hours. Sepsis Labs: No results for input(s): PROCALCITON, LATICACIDVEN in the last 168 hours.  Recent Results (from the past 240 hour(s))  Culture, blood (routine x 2) Call MD if unable to obtain prior to antibiotics being given     Status: None (Preliminary result)   Collection Time: 08/06/19  5:38 AM   Specimen: BLOOD  Result Value Ref Range Status   Specimen Description BLOOD LEFT HAND  Final   Special Requests BOTTLES DRAWN AEROBIC AND ANAEROBIC [place]  Final   Culture   Final    NO GROWTH 2 DAYS Performed at [facility], [address], [place], [place] [address]    Report Status PENDING  Incomplete  SARS Coronavirus  2 by RT PCR (hospital order, performed in [facility] hospital lab) Nasopharyngeal Nasopharyngeal Swab     Status: None   Collection Time: 08/06/19  5:38 AM   Specimen: Nasopharyngeal Swab  Result Value Ref Range Status   SARS Coronavirus 2 NEGATIVE NEGATIVE Final    Comment: (NOTE) SARS-CoV-2 target nucleic acids are NOT DETECTED.  The SARS-CoV-2 RNA is generally detectable in upper and lower respiratory specimens during the acute phase of infection. The lowest concentration of SARS-CoV-2 viral copies this assay can detect is 250 copies / mL. A negative result does not preclude SARS-CoV-2 infection and should not be used as the sole basis for treatment or other patient management decisions.  A negative result may occur with improper specimen collection / handling, submission of specimen other than nasopharyngeal swab, presence of viral mutation(s) within the areas targeted by this assay, and inadequate number of viral copies (<250 copies / mL). A negative result must be combined with clinical observations, patient history, and epidemiological information.  Fact Sheet for  Patients:   StrictlyIdeas.no  Fact Sheet for Healthcare Providers: BankingDealers.co.za  This test is not yet approved or  cleared by the [place] FDA and has been authorized for detection and/or diagnosis of SARS-CoV-2 by FDA under an Emergency Use Authorization (EUA).  This EUA will remain in effect (meaning this test can be used) for the duration of the COVID-19 declaration under Section 564(b)(1) of the Act, 21 U.S.C. section 360bbb-3(b)(1), unless the authorization is terminated or revoked sooner.  Performed at [facility], [address] [place], [place] [address]          Radiology Studies: No results found.      Scheduled Meds: . apixaban  2.5 mg Oral BID  . atorvastatin  40 mg Oral Daily  . DULoxetine  60 mg Oral Daily  . finasteride  5 mg Oral Daily  . furosemide  40 mg Intravenous Q12H  . levothyroxine  112 mcg Oral Q0600  . metoprolol tartrate  25 mg Oral BID  . pantoprazole  40 mg Oral Daily  . sildenafil  20 mg Oral TID  . sodium chloride flush  3 mL Intravenous Q12H  . tamsulosin  0.4 mg Oral Daily   Continuous Infusions: . sodium chloride 250 mL (08/08/19 0522)  . azithromycin 500 mg (08/08/19 1027)  . cefTRIAXone (ROCEPHIN)  IV 2 g (08/08/19 0523)     LOS: 2 days    Time spent: 33 mins     Wyvonnia Dusky, MD [facility] Pager [phone]  If 7PM-7AM, please contact night-coverage [email or web address] 08/08/2019, 8:12 AM

## 2019-08-09 DIAGNOSIS — F0390 Unspecified dementia without behavioral disturbance: Secondary | ICD-10-CM

## 2019-08-09 DIAGNOSIS — J189 Pneumonia, unspecified organism: Secondary | ICD-10-CM

## 2019-08-09 LAB — PHOSPHORUS: Phosphorus: 4.2 mg/dL (ref 2.5–4.6)

## 2019-08-09 LAB — BASIC METABOLIC PANEL
Anion gap: 6 (ref 5–15)
BUN: 29 mg/dL — ABNORMAL HIGH (ref 8–23)
CO2: 32 mmol/L (ref 22–32)
Calcium: 8.6 mg/dL — ABNORMAL LOW (ref 8.9–10.3)
Chloride: 99 mmol/L (ref 98–111)
Creatinine, Ser: 1.15 mg/dL (ref 0.61–1.24)
GFR calc Af Amer: >60 mL/min (ref >60)
GFR calc non Af Amer: 60 mL/min — ABNORMAL LOW (ref >60)
Glucose, Bld: 198 mg/dL — ABNORMAL HIGH (ref 70–99)
Potassium: 3.6 mmol/L (ref 3.5–5.1)
Sodium: 137 mmol/L (ref 135–145)

## 2019-08-09 LAB — CBC
HCT: 35.3 % — ABNORMAL LOW (ref 39.0–52.0)
Hemoglobin: 11.7 g/dL — ABNORMAL LOW (ref 13.0–17.0)
MCH: 27 pg (ref 26.0–34.0)
MCHC: 33.1 g/dL (ref 30.0–36.0)
MCV: 81.5 fL (ref 80.0–100.0)
Platelets: 215 10*3/uL (ref 150–400)
RBC: 4.33 MIL/uL (ref 4.22–5.81)
RDW: 25 % — ABNORMAL HIGH (ref 11.5–15.5)
WBC: 9 10*3/uL (ref 4.0–10.5)
nRBC: 0 % (ref 0.0–0.2)

## 2019-08-09 LAB — GLUCOSE, CAPILLARY
Glucose-Capillary: 196 mg/dL — ABNORMAL HIGH (ref 70–99)
Glucose-Capillary: 240 mg/dL — ABNORMAL HIGH (ref 70–99)
Glucose-Capillary: 175 mg/dL — ABNORMAL HIGH (ref 70–99)
Glucose-Capillary: 159 mg/dL — ABNORMAL HIGH (ref 70–99)

## 2019-08-09 LAB — MAGNESIUM: Magnesium: 1.9 mg/dL (ref 1.7–2.4)

## 2019-08-09 MED ORDER — IPRATROPIUM BROMIDE 0.02 % IN SOLN
0.5000 mg | Freq: Three times a day (TID) | RESPIRATORY_TRACT | Status: DC
  Administered 2019-08-09 – 2019-08-11 (×7): 0.5 mg via RESPIRATORY_TRACT
  Filled 2019-08-09 (×7): qty 2.5

## 2019-08-09 MED ORDER — METOPROLOL TARTRATE 25 MG PO TABS
12.5000 mg | ORAL_TABLET | Freq: Two times a day (BID) | ORAL | Status: DC
  Administered 2019-08-09 – 2019-08-11 (×5): 12.5 mg via ORAL
  Filled 2019-08-09 (×4): qty 1

## 2019-08-09 MED ORDER — FUROSEMIDE 10 MG/ML IJ SOLN
40.0000 mg | Freq: Every day | INTRAMUSCULAR | Status: DC
  Administered 2019-08-10: 40 mg via INTRAVENOUS
  Filled 2019-08-09: qty 4

## 2019-08-09 MED ORDER — DONEPEZIL HCL 5 MG PO TABS: 5.0000 mg | ORAL_TABLET | Freq: Every day | ORAL | Status: DC

## 2019-08-09 MED ORDER — IPRATROPIUM-ALBUTEROL 0.5-2.5 (3) MG/3ML IN SOLN
3.0000 mL | Freq: Four times a day (QID) | RESPIRATORY_TRACT | Status: DC | PRN
  Filled 2019-08-09: qty 3

## 2019-08-09 MED ORDER — AZITHROMYCIN 250 MG PO TABS
500.0000 mg | ORAL_TABLET | Freq: Every day | ORAL | Status: AC
  Administered 2019-08-10 – 2019-08-11 (×2): 500 mg via ORAL
  Filled 2019-08-09 (×2): qty 2

## 2019-08-09 MED ORDER — DOCUSATE SODIUM 100 MG PO CAPS
200.0000 mg | ORAL_CAPSULE | Freq: Two times a day (BID) | ORAL | Status: DC
  Administered 2019-08-09 – 2019-08-10 (×3): 200 mg via ORAL
  Filled 2019-08-09 (×3): qty 2

## 2019-08-09 MED ORDER — DONEPEZIL HCL 5 MG PO TABS
5.0000 mg | ORAL_TABLET | Freq: Every day | ORAL | Status: DC
  Administered 2019-08-09 – 2019-08-11 (×3): 5 mg via ORAL
  Filled 2019-08-09 (×3): qty 1

## 2019-08-09 NOTE — TOC Progression Note (Signed)
Transition of Care [facility]) - Progression Note    Patient Details  Name: CHASIN FINDLING MRN: [id number] Date of Birth: 1940-06-16  [facility] CM/SW Contact  Shelbie Ammons, RN Phone Number: 08/09/2019, 9:09 AM  Clinical Narrative:   RNCM spoke with patient's son by telephone to discuss the need for home health. Son reports that he has recently had home health and in fact thinks they may still be coming. Son reports that he is in the process of calling some of the personal care agencies. He reports patient has wheel chair and walker at home but does think he would benefit from a 3N1. Son denies any other needs at this time.  RNCM reached out to [facility] with [employer] and they do still have patient open for RN and PT.  RNCM reached out to [facility] with [employer] and he will provide 3N1.     Expected Discharge Plan: [place] Barriers to Discharge: Continued Medical Work up  Expected Discharge Plan and Services Expected Discharge Plan: [place] In-house Referral: Clinical Social Work     Living arrangements for the past 2 months: Single Family Home                                       Social Determinants of Health (SDOH) Interventions    Readmission Risk Interventions No flowsheet data found.

## 2019-08-09 NOTE — Progress Notes (Addendum)
Since beginning of shift pt has been pulling monitoring equipment off and trying to get out of bed. Pt son in room now with pt. Expressed concerns about patient not getting Aricept. After talking with Hassan Rowan, NP pt is not getting medication at this time due to bradycardia in the ED. Explained to pt's son. NEW order received to give pt Melatonin 5mg  PO to help pt rest.

## 2019-08-09 NOTE — Progress Notes (Signed)
Pt is resting and appears to be sleeping.

## 2019-08-09 NOTE — Progress Notes (Signed)
PROGRESS NOTE    Derek Shelton  JSE:[id number] DOB: July 12, 1940 DOA: 08/06/2019 PCP: Gladstone Lighter, MD    Assessment & Plan:   Principal Problem:   Acute respiratory failure with hypoxia ([place]) Active Problems:   GI bleeding   Type 2 diabetes mellitus without complication (HCC)   OSA (obstructive sleep apnea)   Atrial fibrillation, chronic (HCC)   Dementia without behavioral disturbance (HCC)   Acute on chronic diastolic CHF (congestive heart failure) (HCC)   History of GI bleed   History of bradycardia   Moderate tricuspid regurgitation   PAH (pulmonary artery hypertension) ([place])   History of CVA (cerebrovascular accident)   LLL pneumonia   HCAP (healthcare-associated pneumonia)   Acute CHF (congestive heart failure) ([place])   Acute respiratory failure with hypoxia: initially requiring BiPAP with no prior history of O2 use but since has been weaned off. CTA chest negative for PE but shows left lower lobe pneumonia. Resolved   Left lower lobe pneumonia: possible HCAP. Continue on IV rocephin, azithromycin.  Continue w/ bronchodilators.Weaned off of supplemental oxygen. Encourage incentive spirometry   Leukocytosis: resolved   Acute on chronic diastolic CHF: last echo in April 2021 showed EF 55%, moderate to severe tricuspid regurgitation. Continue on IV Lasix. Monitor I/Os. Cardio following    Chronic atrial fibrillation: w/ RVR intermittently so will continue metoprolol. IV digoxin prn for HR>120 as per cardio. Continue low-dose Eliquis 2.5 mg twice daily(dose was reduced following recent GI bleed in June 2021). Continue on tele. Cardio recs apprec   Dementia without behavioral disturbance: re-orient prn. Restarted aricept   History of GI bleed June 2021: was hospitalized in June 2021 with symptomatic anemia presenting with shortness of breath, hemoglobin 6.2 and received 2 units packed red blood cells.  Awaiting capsule endoscopy next month and is being followed by  GI.  History of bradycardia: had recent Holter monitor which revealed predominant sinus bradycardia with mean heart rate of 57 bpm. Minimum heart rate was 41 bpm. Considerations for future pacemaker placement outpatient as per cardio  Moderate tricuspid regurgitation: management as per cardio   Pulmonary artery hypertension: continue on home dose of sildenafil. Followed by pulmonology outpatient   History of CVA: continue on eliquis and statins  DM2: continue on SSI   Hypothyroidism: continue on home dose of levothyroxine  Depression: severity unknown. Continue on home dose of duloxetine   GERD: continue on home dose of PPI  BPH: continue on home dose of tamsulosin, finasteride  HLD: continue on statin   Hypokalemia: WNL today. Will continue to monitor    DVT prophylaxis:  eliquis  Code Status: full  Family Communication: discussed pt's care w/ pt's son who is at bedside and answered his questions  Disposition Plan:  Likely d/c home w/ HH. Home health orders placed  Status is: Inpatient  Remains inpatient appropriate because: difficulty ambulating will try to see if PT/OT can see pt again    Dispo: The patient is from: Home              Anticipated d/c is to: Home w/ home health              Anticipated d/c date is: 1 day               Patient currently is not medically stable to d/c.     Consultants:      Procedures:    Antimicrobials: ceftriaxone, azithromycin     Subjective: Pt c/o malaise  Objective: Vitals:   08/08/19 1936 08/08/19 1955 08/08/19 2118 08/09/19 0436  BP:   115/76 109/60  Pulse:   63 78  Resp:    20  Temp: 98.4 F (36.9 C)  98 F (36.7 C) 98.3 F (36.8 C)  TempSrc: Oral  Oral Oral  SpO2:  94% 97% 92%  Weight:      Height:        Intake/Output Summary (Last 24 hours) at 08/09/2019 0742 Last data filed at 08/09/2019 0015 Gross per 24 hour  Intake 360 ml  Output 2400 ml  Net -2040 ml   Filed Weights   08/06/19 0020  08/08/19 0605  Weight: 95 kg 82.2 kg    Examination:  General exam: Appears calm and comfortable  Respiratory system: diminished breath sounds b/l. No rales Cardiovascular system: irregularly irregular. No  rubs, gallops or clicks.  Gastrointestinal system: Abdomen is obese, soft and nontender.  Normal bowel sounds heard. Central nervous system: Alert and oriented. Moves all 4 extremities Psychiatry: Judgement and insight appear normal. Flat mood and affect     Data Reviewed: I have personally reviewed following labs and imaging studies  CBC: Recent Labs  Lab 08/06/19 0037 08/07/19 0530 08/08/19 0418 08/09/19 0318  WBC 15.9* 14.3* 10.8* 9.0  NEUTROABS 13.5* 12.1*  --   --   HGB 10.2* 11.0* 11.3* 11.7*  HCT 32.6* 33.4* 35.6* 35.3*  MCV 85.3 82.3 85.2 81.5  PLT 194 203 218 789   Basic Metabolic Panel: Recent Labs  Lab 08/06/19 0037 08/07/19 0530 08/08/19 0418 08/09/19 0318  NA 135 139 139 137  K 4.4 3.3* 3.7 3.6  CL 96* 99 99 99  CO2 28 30 29  32  GLUCOSE 149* 211* 175* 198*  BUN 16 26* 28* 29*  CREATININE 1.00 1.10 1.20 1.15  CALCIUM 9.1 8.8* 8.7* 8.6*  MG  --   --   --  1.9  PHOS  --   --   --  4.2   GFR: Estimated Creatinine Clearance: 49.6 mL/min (by C-G formula based on SCr of 1.15 mg/dL). Liver Function Tests: Recent Labs  Lab 08/06/19 0037  AST 45*  ALT 19  ALKPHOS 99  BILITOT 2.0*  PROT 8.8*  ALBUMIN 4.4   No results for input(s): LIPASE, AMYLASE in the last 168 hours. No results for input(s): AMMONIA in the last 168 hours. Coagulation Profile: No results for input(s): INR, PROTIME in the last 168 hours. Cardiac Enzymes: No results for input(s): CKTOTAL, CKMB, CKMBINDEX, TROPONINI in the last 168 hours. BNP (last 3 results) No results for input(s): PROBNP in the last 8760 hours. HbA1C: No results for input(s): HGBA1C in the last 72 hours. CBG: Recent Labs  Lab 08/08/19 0847 08/08/19 1132 08/08/19 1608 08/08/19 2105  GLUCAP 182* 172*  143* 194*   Lipid Profile: No results for input(s): CHOL, HDL, LDLCALC, TRIG, CHOLHDL, LDLDIRECT in the last 72 hours. Thyroid Function Tests: No results for input(s): TSH, T4TOTAL, FREET4, T3FREE, THYROIDAB in the last 72 hours. Anemia Panel: No results for input(s): VITAMINB12, FOLATE, FERRITIN, TIBC, IRON, RETICCTPCT in the last 72 hours. Sepsis Labs: No results for input(s): PROCALCITON, LATICACIDVEN in the last 168 hours.  Recent Results (from the past 240 hour(s))  Culture, blood (routine x 2) Call MD if unable to obtain prior to antibiotics being given     Status: None (Preliminary result)   Collection Time: 08/06/19  5:38 AM   Specimen: BLOOD  Result Value Ref Range Status  Specimen Description BLOOD LEFT HAND  Final   Special Requests BOTTLES DRAWN AEROBIC AND ANAEROBIC [place]  Final   Culture   Final    NO GROWTH 3 DAYS Performed at [facility], [address] [place], [place] [address]    Report Status PENDING  Incomplete  SARS Coronavirus 2 by RT PCR (hospital order, performed in [facility] hospital lab) Nasopharyngeal Nasopharyngeal Swab     Status: None   Collection Time: 08/06/19  5:38 AM   Specimen: Nasopharyngeal Swab  Result Value Ref Range Status   SARS Coronavirus 2 NEGATIVE NEGATIVE Final    Comment: (NOTE) SARS-CoV-2 target nucleic acids are NOT DETECTED.  The SARS-CoV-2 RNA is generally detectable in upper and lower respiratory specimens during the acute phase of infection. The lowest concentration of SARS-CoV-2 viral copies this assay can detect is 250 copies / mL. A negative result does not preclude SARS-CoV-2 infection and should not be used as the sole basis for treatment or other patient management decisions.  A negative result may occur with improper specimen collection / handling, submission of specimen other than nasopharyngeal swab, presence of viral mutation(s) within the areas targeted by this assay, and inadequate number of viral  copies (<250 copies / mL). A negative result must be combined with clinical observations, patient history, and epidemiological information.  Fact Sheet for Patients:   StrictlyIdeas.no  Fact Sheet for Healthcare Providers: BankingDealers.co.za  This test is not yet approved or  cleared by the [place] FDA and has been authorized for detection and/or diagnosis of SARS-CoV-2 by FDA under an Emergency Use Authorization (EUA).  This EUA will remain in effect (meaning this test can be used) for the duration of the COVID-19 declaration under Section 564(b)(1) of the Act, 21 U.S.C. section 360bbb-3(b)(1), unless the authorization is terminated or revoked sooner.  Performed at [facility], [address] [place], [place] [address]          Radiology Studies: No results found.      Scheduled Meds: . apixaban  2.5 mg Oral BID  . atorvastatin  40 mg Oral Daily  . DULoxetine  60 mg Oral Daily  . finasteride  5 mg Oral Daily  . furosemide  40 mg Intravenous Q12H  . insulin aspart  0-15 Units Subcutaneous TID WC  . ipratropium  0.5 mg Nebulization TID  . levothyroxine  112 mcg Oral Q0600  . melatonin  5 mg Oral QHS  . metoprolol tartrate  25 mg Oral BID  . pantoprazole  40 mg Oral Daily  . sildenafil  20 mg Oral TID  . sodium chloride flush  3 mL Intravenous Q12H  . tamsulosin  0.4 mg Oral Daily   Continuous Infusions: . sodium chloride 250 mL (08/09/19 0622)  . azithromycin 500 mg (08/09/19 0702)  . cefTRIAXone (ROCEPHIN)  IV 2 g (08/09/19 0625)     LOS: 3 days    Time spent: 30 mins     Wyvonnia Dusky, MD [facility] Pager [phone]  If 7PM-7AM, please contact night-coverage [email or web address] 08/09/2019, 7:42 AM

## 2019-08-09 NOTE — Progress Notes (Signed)
OT Cancellation Note  Patient Details Name: Derek Shelton MRN: [id number] DOB: [birth year]   Cancelled Treatment:    Reason Eval/Treat Not Completed: Patient at procedure or test/ unavailable. OT attempted to see pt for tx session this date. Upon arrival to pt room, pt with RT for breathing treatment. Will re-attempt at a later date/time as available and pt medically appropriate for therapy session.   Shara Blazing, M.S., OTR/L Ascom: [phone] 08/09/19, 2:30 PM

## 2019-08-09 NOTE — Progress Notes (Signed)
Patient ambulated with walker with 1 assist in hallway. Patients HR elevated into the 150's, complaining of dizziness. This RN and NT assisted patient back to bed. VSS, patient made comfortable in bed. MD made aware.

## 2019-08-09 NOTE — Care Management Important Message (Signed)
Important Message  Patient Details  Name: Derek Shelton MRN: [id number] Date of Birth: 12/19/1940   Medicare Important Message Given:  Yes     Dannette Barbara 08/09/2019, 11:23 AM

## 2019-08-09 NOTE — Progress Notes (Addendum)
Pt and wife was offered interpreter but refused at this time. Will continue to monitor.  Update 0435: Pt wife called staff and reported that pt oxygen went down. On assessment pt asleep but notice that pt oxygen went down to 84 % RA, but only stays for a seconds and goes up to 93-95%. Encourage pt and wife to use incentive spirometer. Pt achieved 400 ml x 10 attempts and oxygen stayed at 95 %. Will notify incoming shift. Will continue to monitor.

## 2019-08-09 NOTE — Plan of Care (Signed)
  Problem: Health Behavior/Discharge Planning: Goal: Ability to manage health-related needs will improve Outcome: Progressing   Problem: Clinical Measurements: Goal: Respiratory complications will improve Outcome: Progressing Goal: Cardiovascular complication will be avoided Outcome: Progressing   Problem: Activity: Goal: Risk for activity intolerance will decrease Outcome: Progressing   Problem: Safety: Goal: Ability to remain free from injury will improve Outcome: Progressing   Problem: Activity: Goal: Ability to tolerate increased activity will improve Outcome: Progressing

## 2019-08-09 NOTE — Progress Notes (Signed)
[facility]    SUBJECTIVE: The patient reports feeling fine without chest pain or shortness of breath. The patient's family at bedside and on the phone voice concern about the patient's dementia that has worsened since being in the hospital, and state that would like the patient to be back on Aricept.    Vitals:   08/08/19 2118 08/09/19 0436 08/09/19 0740 08/09/19 0805  BP: 115/76 109/60  101/62  Pulse: 63 78  96  Resp:  20  20  Temp: 98 F (36.7 C) 98.3 F (36.8 C)  97.8 F (36.6 C)  TempSrc: Oral Oral  Oral  SpO2: 97% 92% 97% 98%  Weight:      Height:         Intake/Output Summary (Last 24 hours) at 08/09/2019 0847 Last data filed at 08/09/2019 9371 Gross per 24 hour  Intake 820 ml  Output 2800 ml  Net -1980 ml      PHYSICAL EXAM  General: Well developed, well nourished, elderly gentleman, lying nearly supine in bed, in no acute distress, appears comfortable. HEENT:  Normocephalic and atramatic Neck:   No JVD.  Lungs: normal effort of breathing on room air, diminished breath sounds left lower lobe Heart: irregular irregular . Normal S1 and S2 without gallops or murmurs.  Abdomen: no obvious distention Msk:  Back normal, gait not assessed. No obvious deformities. Extremities: No clubbing, cyanosis or edema.   Neuro: Alert and oriented X 3. Psych:  Good affect, responds appropriately  LABS: Basic Metabolic Panel: Recent Labs    08/08/19 0418 08/09/19 0318  NA 139 137  K 3.7 3.6  CL 99 99  CO2 29 32  GLUCOSE 175* 198*  BUN 28* 29*  CREATININE 1.20 1.15  CALCIUM 8.7* 8.6*  MG  --  1.9  PHOS  --  4.2   Liver Function Tests: No results for input(s): AST, ALT, ALKPHOS, BILITOT, PROT, ALBUMIN in the last 72 hours. No results for input(s): LIPASE, AMYLASE in the last 72 hours. CBC: Recent Labs    08/07/19 0530 08/07/19 0530 08/08/19 0418 08/09/19 0318  WBC 14.3*   < > 10.8* 9.0  NEUTROABS 12.1*  --   --   --   HGB 11.0*   < > 11.3* 11.7*  HCT 33.4*    < > 35.6* 35.3*  MCV 82.3   < > 85.2 81.5  PLT 203   < > 218 215   < > = values in this interval not displayed.   Cardiac Enzymes: No results for input(s): CKTOTAL, CKMB, CKMBINDEX, TROPONINI in the last 72 hours. BNP: Invalid input(s): POCBNP D-Dimer: No results for input(s): DDIMER in the last 72 hours. Hemoglobin A1C: No results for input(s): HGBA1C in the last 72 hours. Fasting Lipid Panel: No results for input(s): CHOL, HDL, LDLCALC, TRIG, CHOLHDL, LDLDIRECT in the last 72 hours. Thyroid Function Tests: No results for input(s): TSH, T4TOTAL, T3FREE, THYROIDAB in the last 72 hours.  Invalid input(s): FREET3 Anemia Panel: No results for input(s): VITAMINB12, FOLATE, FERRITIN, TIBC, IRON, RETICCTPCT in the last 72 hours.  No results found.   Echo 04/29/2019 revealed normal left ventricular function, with LVEF greater than 55%, moderate to severe tricuspid regurgitation, and moderate pulmonary hypertension with estimated RVSP of 64 mmHg.  TELEMETRY: atrial fibrillation, rates 90-110s  ASSESSMENT AND PLAN:  Principal Problem:   Acute respiratory failure with hypoxia (HCC) Active Problems:   GI bleeding   Type 2 diabetes mellitus without complication (HCC)   OSA (obstructive  sleep apnea)   Atrial fibrillation, chronic (HCC)   Dementia without behavioral disturbance (HCC)   Acute on chronic diastolic CHF (congestive heart failure) (HCC)   History of GI bleed   History of bradycardia   Moderate tricuspid regurgitation   PAH (pulmonary artery hypertension) ([place])   History of CVA (cerebrovascular accident)   LLL pneumonia   HCAP (healthcare-associated pneumonia)   Acute CHF (congestive heart failure) ([place])    1. Atrial fibrillation with RVR, with known persistent atrial fibrillation, on low dose Eliquis for stroke prevention, developed rapid ventricular rate in the setting of pneumonia with improvement of rate with one dose of IV digoxin. Rate has remained adequately  controlled in the setting of pneumonia while on metoprolol tartrate 25 mg BID only. 2. Acute on chronic diastolic CHF, improving with IV Lasix 3. Acute respiratory failure with hypoxia, secondary to LLL Pneumonia 4. History of tachybradycardia syndrome with recent Holter monitor revealing mean heart rate of 57 bpm. Review of previous telemetry strips during last admission in 06/2019 revealed 4 and 6 second pauses while patient was on metoprolol and digoxin. Now on metoprolol tartrate 25 mg BID for rate control. No significant bradycardia or pauses observed during this admission.  Pacemaker has been deferred, but will be discussed further as outpatient when pneumonia is resolved.  5. COPD, followed by pulmonary 6. Pulmonary hypertension, followed by pulmonary, on sildenafil 7. History of GI bleed in 06/2019, hemoglobin and hematocrit currently stable while on low dose Eliquis 8. Dementia, patient able to answer some questions, but relies on family members for most of history. Dementia reportedly worse while in hospital and with underlying pneumonia.  Recommendations: 1. Reduce metoprolol tartrate to 12.5 mg BID, restart Aricept per family wishes 2. Continue low dose Eliquis 2.5 mg BID for stroke prevention 3. Continue atorvastatin 40 mg daily 4. Treatment of pneumonia per hospitalist 5. Defer aggressive treatment of tachycardia episodes due to history of bradycardia and pauses 6. Recommend discharging with Lasix 40 mg once daily 7. Recommend discharge today from cardiovascular perspective with follow up with Dr. Saralyn Pilar.  Clabe Seal, PA-C 08/09/2019 8:47 AM Sign off for now; please call with questions. The patient was also evaluated by Dr. Saralyn Pilar and the plan was made in collaboration with him.

## 2019-08-10 LAB — BASIC METABOLIC PANEL
Anion gap: 9 (ref 5–15)
BUN: 23 mg/dL (ref 8–23)
CO2: 30 mmol/L (ref 22–32)
Calcium: 8.6 mg/dL — ABNORMAL LOW (ref 8.9–10.3)
Chloride: 97 mmol/L — ABNORMAL LOW (ref 98–111)
Creatinine, Ser: 1.16 mg/dL (ref 0.61–1.24)
GFR calc Af Amer: >60 mL/min (ref >60)
GFR calc non Af Amer: 59 mL/min — ABNORMAL LOW (ref >60)
Glucose, Bld: 166 mg/dL — ABNORMAL HIGH (ref 70–99)
Potassium: 3.4 mmol/L — ABNORMAL LOW (ref 3.5–5.1)
Sodium: 136 mmol/L (ref 135–145)

## 2019-08-10 LAB — GLUCOSE, CAPILLARY
Glucose-Capillary: 175 mg/dL — ABNORMAL HIGH (ref 70–99)
Glucose-Capillary: 250 mg/dL — ABNORMAL HIGH (ref 70–99)
Glucose-Capillary: 177 mg/dL — ABNORMAL HIGH (ref 70–99)
Glucose-Capillary: 151 mg/dL — ABNORMAL HIGH (ref 70–99)

## 2019-08-10 LAB — MAGNESIUM: Magnesium: 2 mg/dL (ref 1.7–2.4)

## 2019-08-10 LAB — CBC
HCT: 36.5 % — ABNORMAL LOW (ref 39.0–52.0)
Hemoglobin: 12.1 g/dL — ABNORMAL LOW (ref 13.0–17.0)
MCH: 27.3 pg (ref 26.0–34.0)
MCHC: 33.2 g/dL (ref 30.0–36.0)
MCV: 82.2 fL (ref 80.0–100.0)
Platelets: 205 10*3/uL (ref 150–400)
RBC: 4.44 MIL/uL (ref 4.22–5.81)
RDW: 24.6 % — ABNORMAL HIGH (ref 11.5–15.5)
WBC: 8 10*3/uL (ref 4.0–10.5)
nRBC: 0 % (ref 0.0–0.2)

## 2019-08-10 LAB — PHOSPHORUS: Phosphorus: 4.2 mg/dL (ref 2.5–4.6)

## 2019-08-10 MED ORDER — POTASSIUM CHLORIDE CRYS ER 20 MEQ PO TBCR
20.0000 meq | EXTENDED_RELEASE_TABLET | Freq: Once | ORAL | Status: AC
  Administered 2019-08-10: 20 meq via ORAL
  Filled 2019-08-10: qty 1

## 2019-08-10 MED ORDER — DRONABINOL 2.5 MG PO CAPS
2.5000 mg | ORAL_CAPSULE | Freq: Every day | ORAL | Status: DC
  Administered 2019-08-11: 2.5 mg via ORAL
  Filled 2019-08-10: qty 1

## 2019-08-10 NOTE — Progress Notes (Signed)
PROGRESS NOTE    Derek Shelton  RSW:[id number] DOB: 07-May-1940 DOA: 08/06/2019 PCP: Gladstone Lighter, MD    Assessment & Plan:   Principal Problem:   Acute respiratory failure with hypoxia ([place]) Active Problems:   GI bleeding   Type 2 diabetes mellitus without complication (HCC)   OSA (obstructive sleep apnea)   Atrial fibrillation, chronic (HCC)   Dementia without behavioral disturbance (HCC)   Acute on chronic diastolic CHF (congestive heart failure) (HCC)   History of GI bleed   History of bradycardia   Moderate tricuspid regurgitation   PAH (pulmonary artery hypertension) ([place])   History of CVA (cerebrovascular accident)   LLL pneumonia   HCAP (healthcare-associated pneumonia)   Acute CHF (congestive heart failure) ([place])   Acute respiratory failure with hypoxia: initially requiring BiPAP with no prior history of O2 use but since has been weaned off. CTA chest negative for PE but shows left lower lobe pneumonia. Resolved   Left lower lobe pneumonia: possible HCAP. Continue on IV rocephin, azithromycin.  Continue w/ bronchodilators.Weaned off of supplemental oxygen. Encourage incentive spirometry   Leukocytosis: resolved   Acute on chronic diastolic CHF: last echo in April 2021 showed EF 55%, moderate to severe tricuspid regurgitation. Cardio following. Improved   Chronic atrial fibrillation: w/ RVR intermittently especially w/ walking. Will continue metoprolol. IV digoxin prn for HR>120 as per cardio. Continue low-dose Eliquis 2.5 mg twice daily(dose was reduced following recent GI bleed in June 2021). Continue on tele. Cardio recs apprec   Dementia without behavioral disturbance: re-orient prn. Continue aricept   History of GI bleed June 2021: was hospitalized in June 2021 with symptomatic anemia presenting with shortness of breath, hemoglobin 6.2 and received 2 units packed red blood cells.  Awaiting capsule endoscopy next month and is being followed by  GI.  History of bradycardia: had recent Holter monitor which revealed predominant sinus bradycardia with mean heart rate of 57 bpm. Minimum heart rate was 41 bpm. Considerations for future pacemaker placement outpatient as per cardio  Moderate tricuspid regurgitation: management as per cardio   Pulmonary artery hypertension: continue on home dose of sildenafil. Followed by pulmonology outpatient   History of CVA: continue on eliquis and statins  DM2: continue on SSI   Hypothyroidism: continue on home dose of levothyroxine  Depression: severity unknown. Continue on home dose of duloxetine   GERD: continue on home dose of PPI  BPH: continue on home dose of tamsulosin, finasteride  HLD: continue on statin   Hypokalemia: KCl repleted. Will continue to monitor    DVT prophylaxis:  eliquis  Code Status: full  Family Communication: discussed pt's care w/ pt's son who is at bedside and answered his questions as well as pt's other son, Dr. Linus Mako, via phone daily  Disposition Plan:  Likely d/c home w/ [facility].   Status is: Inpatient  Remains inpatient appropriate because:  Labile HR, intermittent a. fib w/ RVR   Dispo: The patient is from: Home              Anticipated d/c is to: Home w/ home health              Anticipated d/c date is: 2-3 days              Patient currently is not medically stable to d/c.   Consultants:      Procedures:    Antimicrobials: ceftriaxone, azithromycin     Subjective: Pt c/o fatigue.  Objective: Vitals:   08/10/19  0502 08/10/19 0517 08/10/19 0619 08/10/19 0730  BP: 92/71  118/73 114/82  Pulse: (!) 112 71  76  Resp: 20 19 20 19   Temp: 98.6 F (37 C)   98.6 F (37 C)  TempSrc: Oral     SpO2: 96% 94%  95%  Weight:      Height:        Intake/Output Summary (Last 24 hours) at 08/10/2019 0807 Last data filed at 08/10/2019 0700 Gross per 24 hour  Intake 755.5 ml  Output 750 ml  Net 5.5 ml   Filed Weights   08/06/19 0020  08/08/19 0605 08/10/19 0500  Weight: 95 kg 82.2 kg 82.6 kg    Examination:  General exam: Appears calm and comfortable  Respiratory system: decreased breath sounds b/l. No rales, rhonchi or wheezes Cardiovascular system: irregularly irregular. No  rubs, gallops or clicks.  Gastrointestinal system: Abdomen is obese, soft and nontender.  Hypoactive bowel sounds heard. Central nervous system: Awake. Moves all 4 extremities Psychiatry: Judgement and insight appear abnormal. Flat mood and affect     Data Reviewed: I have personally reviewed following labs and imaging studies  CBC: Recent Labs  Lab 08/06/19 0037 08/07/19 0530 08/08/19 0418 08/09/19 0318 08/10/19 0317  WBC 15.9* 14.3* 10.8* 9.0 8.0  NEUTROABS 13.5* 12.1*  --   --   --   HGB 10.2* 11.0* 11.3* 11.7* 12.1*  HCT 32.6* 33.4* 35.6* 35.3* 36.5*  MCV 85.3 82.3 85.2 81.5 82.2  PLT 194 203 218 215 974   Basic Metabolic Panel: Recent Labs  Lab 08/06/19 0037 08/07/19 0530 08/08/19 0418 08/09/19 0318 08/10/19 0317  NA 135 139 139 137 136  K 4.4 3.3* 3.7 3.6 3.4*  CL 96* 99 99 99 97*  CO2 28 30 29  32 30  GLUCOSE 149* 211* 175* 198* 166*  BUN 16 26* 28* 29* 23  CREATININE 1.00 1.10 1.20 1.15 1.16  CALCIUM 9.1 8.8* 8.7* 8.6* 8.6*  MG  --   --   --  1.9 2.0  PHOS  --   --   --  4.2 4.2   GFR: Estimated Creatinine Clearance: 49.3 mL/min (by C-G formula based on SCr of 1.16 mg/dL). Liver Function Tests: Recent Labs  Lab 08/06/19 0037  AST 45*  ALT 19  ALKPHOS 99  BILITOT 2.0*  PROT 8.8*  ALBUMIN 4.4   No results for input(s): LIPASE, AMYLASE in the last 168 hours. No results for input(s): AMMONIA in the last 168 hours. Coagulation Profile: No results for input(s): INR, PROTIME in the last 168 hours. Cardiac Enzymes: No results for input(s): CKTOTAL, CKMB, CKMBINDEX, TROPONINI in the last 168 hours. BNP (last 3 results) No results for input(s): PROBNP in the last 8760 hours. HbA1C: No results for  input(s): HGBA1C in the last 72 hours. CBG: Recent Labs  Lab 08/09/19 0807 08/09/19 1134 08/09/19 1619 08/09/19 2052 08/10/19 0731  GLUCAP 196* 240* 175* 159* 175*   Lipid Profile: No results for input(s): CHOL, HDL, LDLCALC, TRIG, CHOLHDL, LDLDIRECT in the last 72 hours. Thyroid Function Tests: No results for input(s): TSH, T4TOTAL, FREET4, T3FREE, THYROIDAB in the last 72 hours. Anemia Panel: No results for input(s): VITAMINB12, FOLATE, FERRITIN, TIBC, IRON, RETICCTPCT in the last 72 hours. Sepsis Labs: No results for input(s): PROCALCITON, LATICACIDVEN in the last 168 hours.  Recent Results (from the past 240 hour(s))  Culture, blood (routine x 2) Call MD if unable to obtain prior to antibiotics being given     Status:  None (Preliminary result)   Collection Time: 08/06/19  5:38 AM   Specimen: BLOOD  Result Value Ref Range Status   Specimen Description BLOOD LEFT HAND  Final   Special Requests BOTTLES DRAWN AEROBIC AND ANAEROBIC [place]  Final   Culture   Final    NO GROWTH 4 DAYS Performed at [facility], [address], [place], [place] [address]    Report Status PENDING  Incomplete  SARS Coronavirus 2 by RT PCR (hospital order, performed in [place] hospital lab) Nasopharyngeal Nasopharyngeal Swab     Status: None   Collection Time: 08/06/19  5:38 AM   Specimen: Nasopharyngeal Swab  Result Value Ref Range Status   SARS Coronavirus 2 NEGATIVE NEGATIVE Final    Comment: (NOTE) SARS-CoV-2 target nucleic acids are NOT DETECTED.  The SARS-CoV-2 RNA is generally detectable in upper and lower respiratory specimens during the acute phase of infection. The lowest concentration of SARS-CoV-2 viral copies this assay can detect is 250 copies / mL. A negative result does not preclude SARS-CoV-2 infection and should not be used as the sole basis for treatment or other patient management decisions.  A negative result may occur with improper specimen collection /  handling, submission of specimen other than nasopharyngeal swab, presence of viral mutation(s) within the areas targeted by this assay, and inadequate number of viral copies (<250 copies / mL). A negative result must be combined with clinical observations, patient history, and epidemiological information.  Fact Sheet for Patients:   StrictlyIdeas.no  Fact Sheet for Healthcare Providers: BankingDealers.co.za  This test is not yet approved or  cleared by the [place] FDA and has been authorized for detection and/or diagnosis of SARS-CoV-2 by FDA under an Emergency Use Authorization (EUA).  This EUA will remain in effect (meaning this test can be used) for the duration of the COVID-19 declaration under Section 564(b)(1) of the Act, 21 U.S.C. section 360bbb-3(b)(1), unless the authorization is terminated or revoked sooner.  Performed at [facility], [address], [place], [place] [address]          Radiology Studies: No results found.      Scheduled Meds: . apixaban  2.5 mg Oral BID  . atorvastatin  40 mg Oral Daily  . azithromycin  500 mg Oral Daily  . docusate sodium  200 mg Oral BID  . donepezil  5 mg Oral Daily  . DULoxetine  60 mg Oral Daily  . finasteride  5 mg Oral Daily  . furosemide  40 mg Intravenous Daily  . insulin aspart  0-15 Units Subcutaneous TID WC  . ipratropium  0.5 mg Nebulization TID  . levothyroxine  112 mcg Oral Q0600  . melatonin  5 mg Oral QHS  . metoprolol tartrate  12.5 mg Oral BID  . pantoprazole  40 mg Oral Daily  . sildenafil  20 mg Oral TID  . sodium chloride flush  3 mL Intravenous Q12H  . tamsulosin  0.4 mg Oral Daily   Continuous Infusions: . sodium chloride 250 mL (08/09/19 0622)  . cefTRIAXone (ROCEPHIN)  IV 2 g (08/10/19 0616)     LOS: 4 days    Time spent: 33 mins     Wyvonnia Dusky, MD [facility] Pager [phone]  If 7PM-7AM, please  contact night-coverage [email or web address] 08/10/2019, 8:07 AM

## 2019-08-10 NOTE — Progress Notes (Signed)
[facility]  SUBJECTIVE: Shin laying in bed, denies chest pain or shortness of breath   Vitals:   08/10/19 0517 08/10/19 0619 08/10/19 0730 08/10/19 0817  BP:  118/73 114/82   Pulse: 71  76   Resp: 19 20 19    Temp:   98.6 F (37 C)   TempSrc:      SpO2: 94%  95% 97%  Weight:      Height:         Intake/Output Summary (Last 24 hours) at 08/10/2019 0827 Last data filed at 08/10/2019 0700 Gross per 24 hour  Intake 755.5 ml  Output 750 ml  Net 5.5 ml      PHYSICAL EXAM  General: Well developed, well nourished, in no acute distress HEENT:  Normocephalic and atramatic Neck:  No JVD.  Lungs: Clear bilaterally to auscultation and percussion. Heart: HRRR . Normal S1 and S2 without gallops or murmurs.  Abdomen: Bowel sounds are positive, abdomen soft and non-tender  Msk:  Back normal, normal gait. Normal strength and tone for age. Extremities: No clubbing, cyanosis or edema.   Neuro: Alert and oriented X 3. Psych:  Good affect, responds appropriately   LABS: Basic Metabolic Panel: Recent Labs    08/09/19 0318 08/10/19 0317  NA 137 136  K 3.6 3.4*  CL 99 97*  CO2 32 30  GLUCOSE 198* 166*  BUN 29* 23  CREATININE 1.15 1.16  CALCIUM 8.6* 8.6*  MG 1.9 2.0  PHOS 4.2 4.2   Liver Function Tests: No results for input(s): AST, ALT, ALKPHOS, BILITOT, PROT, ALBUMIN in the last 72 hours. No results for input(s): LIPASE, AMYLASE in the last 72 hours. CBC: Recent Labs    08/09/19 0318 08/10/19 0317  WBC 9.0 8.0  HGB 11.7* 12.1*  HCT 35.3* 36.5*  MCV 81.5 82.2  PLT 215 205   Cardiac Enzymes: No results for input(s): CKTOTAL, CKMB, CKMBINDEX, TROPONINI in the last 72 hours. BNP: Invalid input(s): POCBNP D-Dimer: No results for input(s): DDIMER in the last 72 hours. Hemoglobin A1C: No results for input(s): HGBA1C in the last 72 hours. Fasting Lipid Panel: No results for input(s): CHOL, HDL, LDLCALC, TRIG, CHOLHDL, LDLDIRECT in the last 72 hours. Thyroid  Function Tests: No results for input(s): TSH, T4TOTAL, T3FREE, THYROIDAB in the last 72 hours.  Invalid input(s): FREET3 Anemia Panel: No results for input(s): VITAMINB12, FOLATE, FERRITIN, TIBC, IRON, RETICCTPCT in the last 72 hours.  No results found.   Echo LVEF greater than 55% with moderate to severe TR, RVSP P 64 mmHg by 2D echocardiogram 04/29/2019  TELEMETRY: Atrial fibrillation 90 to 100 bpm:  ASSESSMENT AND PLAN:  Principal Problem:   Acute respiratory failure with hypoxia (HCC) Active Problems:   GI bleeding   Type 2 diabetes mellitus without complication (HCC)   OSA (obstructive sleep apnea)   Atrial fibrillation, chronic (HCC)   Dementia without behavioral disturbance (HCC)   Acute on chronic diastolic CHF (congestive heart failure) (HCC)   History of GI bleed   History of bradycardia   Moderate tricuspid regurgitation   PAH (pulmonary artery hypertension) ([place])   History of CVA (cerebrovascular accident)   LLL pneumonia   HCAP (healthcare-associated pneumonia)   Acute CHF (congestive heart failure) ([place])    1.  Atrial fibrillation with RVR, on low-dose Eliquis for stroke prevention, rate appears to be better controlled, on metoprolol tartrate 12.5 mg twice daily, with infrequent doses of IV digoxin 2.  Tachybradycardia syndrome, with atrial fibrillation with RVR alternating  with sinus bradycardia and sinus pauses, currently appears clinically stable, in consider for permanent pacemaker, currently deferred due to going pneumonia, with additional concerns about dementia 3.  Acute respiratory failure with hypoxia, secondary to left lower lobe pneumonia 4.  Acute on chronic diastolic congestive heart failure, improved on IV Lasix 5.  COPD 6.  Pulmonary hypertension 7.  History of GI bleed 06/2019, hemoglobin and hematocrit stable on low-dose Eliquis 8.  Dementia, exacerbated by current hospitalization  Recommendations  1.  Continue current medications 2.   Continue low-dose Eliquis for stroke prevention 3.  Continue metoprolol 12.5 mg twice daily 4.  Continue antibiotics for underlying pneumonia 5.  Defer permanent pacemaker implantation at this time 6.  Follow-up in 1 week after discharge   Isaias Cowman, MD, PhD, [facility] 08/10/2019 8:27 AM

## 2019-08-10 NOTE — Progress Notes (Signed)
   08/10/19 0502  Assess: MEWS Score  Temp 98.6 F (37 C)  BP 92/71  Pulse Rate (!) 112  ECG Heart Rate (!) 112  Resp 20  SpO2 96 %  O2 Device Room Air  Assess: MEWS Score  MEWS Temp 0  MEWS Systolic 1  MEWS Pulse 2  MEWS RR 0  MEWS LOC 0  MEWS Score 3  MEWS Score Color Yellow  Assess: if the MEWS score is Yellow or Red  Were vital signs taken at a resting state? Yes  Focused Assessment Documented focused assessment  Early Detection of Sepsis Score *See Row Information* Medium  MEWS guidelines implemented *See Row Information* No, previously yellow, continue vital signs every 4 hours  Treat  MEWS Interventions Administered prn meds/treatments  Take Vital Signs  Increase Vital Sign Frequency  Yellow: Q 2hr X 2 then Q 4hr X 2, if remains yellow, continue Q 4hrs  Escalate  MEWS: Escalate Yellow: discuss with charge nurse/RN and consider discussing with provider and RRT  Notify: Charge Nurse/RN  Name of Charge Nurse/RN Notified Alisa, RN  Date Charge Nurse/RN Notified 08/10/19  Time Charge Nurse/RN Notified [phone]  Document  Progress note created (see row info) Yes

## 2019-08-10 NOTE — Progress Notes (Signed)
Patient ambulated with walker, 1 assist around nurses station. Pts HR elevated into the 150's-160's upon ambulating, patient voiced that he felt SOB. Patient appears to be more lethargic this afternoon while laying in bed, needing repeated attempts to wake up and administer medications. MD made aware. No new orders at this time.

## 2019-08-11 LAB — BASIC METABOLIC PANEL
Anion gap: 5 (ref 5–15)
BUN: 19 mg/dL (ref 8–23)
CO2: 30 mmol/L (ref 22–32)
Calcium: 8.5 mg/dL — ABNORMAL LOW (ref 8.9–10.3)
Chloride: 102 mmol/L (ref 98–111)
Creatinine, Ser: 1.16 mg/dL (ref 0.61–1.24)
GFR calc Af Amer: >60 mL/min (ref >60)
GFR calc non Af Amer: 59 mL/min — ABNORMAL LOW (ref >60)
Glucose, Bld: 162 mg/dL — ABNORMAL HIGH (ref 70–99)
Potassium: 3.8 mmol/L (ref 3.5–5.1)
Sodium: 137 mmol/L (ref 135–145)

## 2019-08-11 LAB — CULTURE, BLOOD (ROUTINE X 2): Culture: NO GROWTH

## 2019-08-11 LAB — GLUCOSE, CAPILLARY
Glucose-Capillary: 153 mg/dL — ABNORMAL HIGH (ref 70–99)
Glucose-Capillary: 185 mg/dL — ABNORMAL HIGH (ref 70–99)

## 2019-08-11 LAB — CBC
HCT: 37.9 % — ABNORMAL LOW (ref 39.0–52.0)
Hemoglobin: 11.8 g/dL — ABNORMAL LOW (ref 13.0–17.0)
MCH: 26.6 pg (ref 26.0–34.0)
MCHC: 31.1 g/dL (ref 30.0–36.0)
MCV: 85.6 fL (ref 80.0–100.0)
Platelets: 218 10*3/uL (ref 150–400)
RBC: 4.43 MIL/uL (ref 4.22–5.81)
RDW: 24 % — ABNORMAL HIGH (ref 11.5–15.5)
WBC: 10.1 10*3/uL (ref 4.0–10.5)
nRBC: 0 % (ref 0.0–0.2)

## 2019-08-11 LAB — MAGNESIUM: Magnesium: 2.1 mg/dL (ref 1.7–2.4)

## 2019-08-11 LAB — PHOSPHORUS: Phosphorus: 3.2 mg/dL (ref 2.5–4.6)

## 2019-08-11 MED ORDER — METOPROLOL TARTRATE 25 MG PO TABS: 25.0000 mg | ORAL_TABLET | Freq: Two times a day (BID) | ORAL | Status: DC

## 2019-08-11 NOTE — Discharge Summary (Signed)
Physician Discharge Summary  Derek Shelton:[id number] DOB: 04/05/1940 DOA: 08/06/2019  PCP: Gladstone Lighter, MD  Admit date: 08/06/2019 Discharge date: 08/11/2019  Admitted From: home Disposition:  Home w/ home health   Recommendations for Outpatient Follow-up:  1. Follow up with PCP in 1-2 weeks 2. F/u cardio, Dr. Saralyn Pilar, in 2 weeks   Home Health: yes Equipment/Devices: 3n1  Discharge Condition: stable CODE STATUS: full  Diet recommendation: Heart Healthy / Carb Modified  Brief/Interim Summary: HPI was taken from Dr. Damita Dunnings: Derek Shelton is a 79 y.o. male with medical history significant for  COPD, diabetes, diastolic CHF EF 87% on 08/30/7670,, moderate to severe tricuspid regurgitation and moderate PAH followed by pulmonology, hypothyroidism, history of CVA, persistent A. fib on low-dose Eliquis due to recent history of GI bleed, and dementia, who presents to the emergency room with dyspnea on exertion that became acutely worse in the past several hours..  Patient was seen by his cardiologist yesterday as a follow-up from recent Holter monitoring that showed mostly sinus bradycardia with mean heart rate 57, lowest heart rate 41.   patient has been having shortness of breath, dyspnea on exertion and lower extremity edema, for the past few months.  Most of the history taken from son at bedside who states that earlier in the morning, patient developed a bout of coughing and progressively became more dyspneic.  Patient has had no chest pain, fever or chills, nausea vomiting.  ED Course: On arrival, T 99.2, BP 163/71, HR 77, RR 24 with O2 sat 91% on room air.  Troponin 18, BNP 260.  Chest x-ray mild vascular congestion.  EKG showed junctional rhythm.  WBC 16,000, Hb 10.2, up from 8.  Mildly elevated LFTs with AST 45, bili 2.  Venous blood gas pH 7.48 PCO2 40.  Patient subsequently had CTA chest negative for acute PE but showing left lower lobe pneumonia.  Hospital Course from Dr. Lenise Herald 7/14-7/18/21: Pt presented w/ pneumonia and was treated w/ IV abxs, bronchodilators, incentive spirometry and supplemental oxygen. Pt was able to be weaned from supplemental oxygen prior to d/c. Pt completed the course of abxs while inpatient. Of note, PT/OT saw the pt and recommended home health. Home health was set up prior to d/c by CM. Also, pt had a. fib w/ RVR intermittently throughout hospital stay that was controlled w/ metoprolol & prn digoxin as per cardio. Pt will f/u w/ cardio, Dr. Saralyn Pilar, in 1 week after d/c. For more information, please see previous progress notes   Discharge Diagnoses:  Principal Problem:   Acute respiratory failure with hypoxia ([place]) Active Problems:   GI bleeding   Type 2 diabetes mellitus without complication (HCC)   OSA (obstructive sleep apnea)   Atrial fibrillation, chronic (HCC)   Dementia without behavioral disturbance (HCC)   Acute on chronic diastolic CHF (congestive heart failure) (HCC)   History of GI bleed   History of bradycardia   Moderate tricuspid regurgitation   PAH (pulmonary artery hypertension) ([place])   History of CVA (cerebrovascular accident)   LLL pneumonia   HCAP (healthcare-associated pneumonia)   Acute CHF (congestive heart failure) (HCC)  Acute respiratory failure with hypoxia: initially requiring BiPAP with no prior history of O2 use but since has been weaned off. CTA chest negative for PE but shows left lower lobe pneumonia. Resolved   Left lower lobe pneumonia: possible HCAP. Completed abx course.  Continue w/ bronchodilators.Weaned off of supplemental oxygen. Encourage incentive spirometry   Leukocytosis: resolved  Acute on chronic diastolic CHF: last echo in April 2021 showed EF 55%, moderate to severe tricuspid regurgitation. Cardio following. Much improved  Chronic atrial fibrillation: w/ RVR intermittently especially w/ walking. Will continue metoprolol. IV digoxin prn for HR>120 as per cardio. Continue  low-dose Eliquis 2.5 mg twice daily(dose was reduced following recent GI bleed in June 2021). Continue on tele. Cardio recs apprec   Dementia without behavioral disturbance: re-orient prn. Continue aricept   History of GI bleedJune 2021: was hospitalized in June 2021with symptomatic anemia presenting with shortness of breath, hemoglobin 6.2 and received 2 units packed red blood cells. Awaiting capsule endoscopy next month and is being followed by GI.  History of bradycardia: had recent Holter monitor which revealed predominant sinus bradycardia with mean heart rate of 57 bpm. Minimum heart rate was 41 bpm. Considerations for future pacemaker placement outpatient as per cardio  Moderate tricuspid regurgitation: management as per cardio   Pulmonary artery hypertension: continue on home dose of sildenafil. Followed by pulmonology outpatient   History of CVA: continue on eliquis and statins  DM2: continue on SSI   Hypothyroidism: continue on home dose of levothyroxine  Depression: severity unknown. Continue on home dose of duloxetine   GERD: continue on home dose of PPI  BPH: continue on home dose of tamsulosin, finasteride  HLD: continue on statin   Hypokalemia: KCl repleted. Will continue to monitor    Discharge Instructions  Discharge Instructions    Diet - low sodium heart healthy   Complete by: As directed    Diet Carb Modified   Complete by: As directed    Discharge instructions   Complete by: As directed    F/u PCP in 1 week. F/u cardio in 1-2 weeks. Continue to hold digoxin, lasix until you see your cardiologist as an outpatient   Increase activity slowly   Complete by: As directed      Allergies as of 08/11/2019      Reactions   Carvedilol Shortness Of Breath   Other reaction(s): Asthma, Shortness of Breath   Penicillins Shortness Of Breath   Other reaction(s): Other (See Comments) Other reaction(s): RASH Other reaction(s): RASH Other  reaction(s): RASH   Aspirin    Other reaction(s): Other (See Comments) On eliquis already- high risk of bleeding   Aricept [donepezil Hcl] Other (See Comments)   Medication cause significant bradycardia   Catapres [clonidine Hcl]       Medication List    STOP taking these medications   esomeprazole 40 MG capsule Commonly known as: [place] these medications   apixaban 2.5 MG Tabs tablet Commonly known as: ELIQUIS Take 1 tablet (2.5 mg total) by mouth 2 (two) times daily.   atorvastatin 40 MG tablet Commonly known as: LIPITOR Take 1 tablet (40 mg total) by mouth daily at 6 PM.   budesonide-formoterol 160-4.5 MCG/ACT inhaler Commonly known as: SYMBICORT Inhale 2 puffs into the lungs 2 (two) times daily.   digoxin 0.125 MG tablet Commonly known as: LANOXIN Take 1 tablet (0.125 mg total) by mouth daily. What changed:   when to take this  additional instructions   donepezil 5 MG tablet Commonly known as: ARICEPT Take 5 mg by mouth at bedtime.   DULoxetine 60 MG capsule Commonly known as: CYMBALTA Take 60 mg by mouth daily.   ergocalciferol 1.25 MG (50000 UT) capsule Commonly known as: VITAMIN D2 Take 50,000 Units by mouth once a week.   ferrous sulfate 325 (65 FE)  MG EC tablet Take 1 tablet (325 mg total) by mouth 2 (two) times daily. What changed: when to take this   finasteride 5 MG tablet Commonly known as: PROSCAR Take 1 tablet (5 mg total) by mouth daily.   furosemide 20 MG tablet Commonly known as: LASIX Take 40 mg by mouth daily.   ipratropium-albuterol 0.5-2.5 (3) MG/3ML Soln Commonly known as: DUONEB Inhale 3 mLs into the lungs every 6 (six) hours as needed for shortness of breath.   latanoprost 0.005 % ophthalmic solution Commonly known as: XALATAN latanoprost 0.005 % eye drops  INSTILL ONE DROP TO BOTH EYES AT BEDTIME.   levothyroxine 112 MCG tablet Commonly known as: SYNTHROID Take 112 mcg by mouth daily before breakfast.    melatonin 3 MG Tabs tablet Take 3 mg by mouth at bedtime as needed (sleep).   metoprolol tartrate 25 MG tablet Commonly known as: LOPRESSOR Take 25 mg by mouth 2 (two) times daily.   montelukast 10 MG tablet Commonly known as: SINGULAIR Take 10 mg by mouth daily.   omega-3 acid ethyl esters 1 g capsule Commonly known as: LOVAZA Take 1 g by mouth daily.   pantoprazole 40 MG tablet Commonly known as: PROTONIX Take 40 mg by mouth daily.   potassium chloride SA 20 MEQ tablet Commonly known as: KLOR-CON Take 10 mEq by mouth 3 (three) times a week. Take 1/2 tablets (10MEQ) by mouth twice daily for 3 days as directed Mon., Wed., Fri.   sildenafil 20 MG tablet Commonly known as: REVATIO Take 20 mg by mouth 3 (three) times daily.   tamsulosin 0.4 MG Caps capsule Commonly known as: FLOMAX Take 0.4 mg by mouth daily.   tiotropium 18 MCG inhalation capsule Commonly known as: SPIRIVA Place 18 mcg into inhaler and inhale daily.   Tyler Aas FlexTouch 100 UNIT/ML FlexTouch Pen Generic drug: insulin degludec Inject 30 Units into the skin at bedtime.            Durable Medical Equipment  (From admission, onward)         Start     Ordered   08/07/19 1725  For home use only DME 3 n 1  Once        08/07/19 1724          Follow-up Information    [facility] Follow up on 08/27/2019.   Specialty: Cardiology Why: at 11:00am. Enter through the [place] entrance Contact information: [place] [phone]       Gladstone Lighter, MD Follow up in 2 week(s).   Specialty: Internal Medicine Contact information: [place] [address] [phone]        Isaias Cowman, MD Follow up in 1 week(s).   Specialty: Cardiology Contact information: [facility] [place] [address] [phone]               Allergies  Allergen Reactions  . Carvedilol Shortness Of Breath    Other reaction(s): Asthma, Shortness of Breath  . Penicillins Shortness Of Breath    Other reaction(s): Other (See Comments) Other reaction(s): RASH Other reaction(s): RASH Other reaction(s): RASH   . Aspirin     Other reaction(s): Other (See Comments) On eliquis already- high risk of bleeding  . Aricept [Donepezil Hcl] Other (See Comments)    Medication cause significant bradycardia  . Catapres [Clonidine Hcl]     Consultations:  Cardio  Procedures/Studies: CT Head Wo Contrast  Result Date: 07/16/2019 CLINICAL DATA:  Head trauma headache EXAM: CT HEAD WITHOUT CONTRAST TECHNIQUE: Contiguous axial images were obtained from the base of the skull through the vertex without intravenous contrast. COMPARISON:  CT brain 12/03/2018, MRI 12/06/2017, CT 08/05/2004 FINDINGS: Brain: No acute territorial infarction, hemorrhage, or new intracranial mass. Left middle cranial fossa meningioma again noted. Atrophy and mild chronic small vessel ischemic change of the white matter. Stable ventricle size. Vascular: No hyperdense vessels.  Carotid vascular calcification Skull: No fracture. Heterogeneous lucency at the left central skull base, likely due to bony remodeling from meningioma. Sinuses/Orbits: Opacified right maxillary sinus. Mucosal thickening in the ethmoid sinuses. Other: None IMPRESSION: 1. No CT evidence for acute intracranial abnormality. Atrophy and chronic small vessel ischemic change of the white matter 2. 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 Electronically Signed   By: Donavan Foil M.D.   On: 07/16/2019 21:31   CT Angio Chest PE W and/or Wo Contrast  Result Date: 08/06/2019 CLINICAL DATA:  Shortness of breath for 1 day. EXAM: CT ANGIOGRAPHY CHEST WITH CONTRAST TECHNIQUE: Multidetector CT imaging of the chest was performed using the standard protocol  during bolus administration of intravenous contrast. Multiplanar CT image reconstructions and MIPs were obtained to evaluate the vascular anatomy. CONTRAST:  34mL OMNIPAQUE IOHEXOL 350 MG/ML SOLN COMPARISON:  Chest x-ray from earlier today FINDINGS: Cardiovascular: Enlarged heart. No pericardial effusion. Limited assessment of the pulmonary arteries due to motion primarily. No visible pulmonary embolism. Diffuse atherosclerotic calcification of the aorta and multifocal coronary atherosclerosis. Mediastinum/Nodes: Negative for adenopathy or mass. Lungs/Pleura: Left lower lobe consolidation. Milder airspace disease in the superior segment right lower lobe. No edema, effusion, or pneumothorax. Motion artifact. Tracheomalacia. Upper Abdomen: No acute finding Musculoskeletal: Spondylosis with multilevel thoracic ankylosis. Review of the MIP images confirms the above findings. IMPRESSION: 1. Left lower lobe pneumonia. 2. Cardiomegaly without failure. 3. As permitted by moderate respiratory motion, no evidence of pulmonary embolism. Electronically Signed   By: Monte Fantasia M.D.   On: 08/06/2019 04:57   DG Chest Port 1 View  Result Date: 08/06/2019 CLINICAL DATA:  79 year old male with dyspnea. EXAM: PORTABLE CHEST 1 VIEW COMPARISON:  Chest radiograph dated 07/06/2019. FINDINGS: There is cardiomegaly with mild vascular congestion. No edema. No focal consolidation, pleural effusion, or pneumothorax. Atherosclerotic calcification of the aorta. No acute osseous pathology. Old healed right clavicular fracture. IMPRESSION: Cardiomegaly with mild vascular congestion. No focal consolidation. Electronically Signed   By: Anner Crete M.D.   On: 08/06/2019 00:50       Subjective: Pt c/o fatigue   Discharge Exam: Vitals:   08/11/19 0738 08/11/19 1137  BP: 100/67 (!) 109/58  Pulse: 65 71  Resp: 19 18  Temp: 99 F (37.2 C) 98.4 F (36.9 C)  SpO2: 98% 97%   Vitals:   08/11/19 0454 08/11/19 0719 08/11/19  0738 08/11/19 1137  BP: (!) 118/94  100/67 (!) 109/58  Pulse: 70  65 71  Resp:   19 18  Temp:   99 F (37.2 C) 98.4 F (36.9 C)  TempSrc:   Oral   SpO2:  94% 98% 97%  Weight: 82.7 kg     Height:        General: Pt is alert, awake, not in acute distress Cardiovascular: irregularly irregular, no rubs, no gallops Respiratory: diminished breath sounds b/l.  Abdominal: Soft, NT, ND, bowel sounds + Extremities:  no cyanosis  The results of significant diagnostics from this hospitalization (including imaging, microbiology, ancillary and laboratory) are listed below for reference.     Microbiology: Recent Results (from the past 240 hour(s))  Culture, blood (routine x 2) Call MD if unable to obtain prior to antibiotics being given     Status: None   Collection Time: 08/06/19  5:38 AM   Specimen: BLOOD  Result Value Ref Range Status   Specimen Description BLOOD LEFT HAND  Final   Special Requests BOTTLES DRAWN AEROBIC AND ANAEROBIC [place]  Final   Culture   Final    NO GROWTH 5 DAYS Performed at [facility], [address] [place], [place] [address]    Report Status 08/11/2019 FINAL  Final  SARS Coronavirus 2 by RT PCR (hospital order, performed in [facility] hospital lab) Nasopharyngeal Nasopharyngeal Swab     Status: None   Collection Time: 08/06/19  5:38 AM   Specimen: Nasopharyngeal Swab  Result Value Ref Range Status   SARS Coronavirus 2 NEGATIVE NEGATIVE Final    Comment: (NOTE) SARS-CoV-2 target nucleic acids are NOT DETECTED.  The SARS-CoV-2 RNA is generally detectable in upper and lower respiratory specimens during the acute phase of infection. The lowest concentration of SARS-CoV-2 viral copies this assay can detect is 250 copies / mL. A negative result does not preclude SARS-CoV-2 infection and should not be used as the sole basis for treatment or other patient management decisions.  A negative result may occur with improper specimen collection /  handling, submission of specimen other than nasopharyngeal swab, presence of viral mutation(s) within the areas targeted by this assay, and inadequate number of viral copies (<250 copies / mL). A negative result must be combined with clinical observations, patient history, and epidemiological information.  Fact Sheet for Patients:   StrictlyIdeas.no  Fact Sheet for Healthcare Providers: BankingDealers.co.za  This test is not yet approved or  cleared by the [place] FDA and has been authorized for detection and/or diagnosis of SARS-CoV-2 by FDA under an Emergency Use Authorization (EUA).  This EUA will remain in effect (meaning this test can be used) for the duration of the COVID-19 declaration under Section 564(b)(1) of the Act, 21 U.S.C. section 360bbb-3(b)(1), unless the authorization is terminated or revoked sooner.  Performed at [facility], [address] [place], [place] [address]      Labs: BNP (last 3 results) Recent Labs    07/06/19 1142 08/06/19 0037  BNP 397.0* 211.9*   Basic Metabolic Panel: Recent Labs  Lab 08/07/19 0530 08/08/19 0418 08/09/19 0318 08/10/19 0317 08/11/19 0425  NA 139 139 137 136 137  K 3.3* 3.7 3.6 3.4* 3.8  CL 99 99 99 97* 102  CO2 30 29 32 30 30  GLUCOSE 211* 175* 198* 166* 162*  BUN 26* 28* 29* 23 19  CREATININE 1.10 1.20 1.15 1.16 1.16  CALCIUM 8.8* 8.7* 8.6* 8.6* 8.5*  MG  --   --  1.9 2.0 2.1  PHOS  --   --  4.2 4.2 3.2   Liver Function Tests: Recent Labs  Lab 08/06/19 0037  AST 45*  ALT 19  ALKPHOS 99  BILITOT 2.0*  PROT 8.8*  ALBUMIN 4.4   No results for input(s): LIPASE, AMYLASE in the last 168 hours. No results for input(s): AMMONIA in the last 168 hours. CBC: Recent Labs  Lab 08/06/19 0037 08/06/19 0037 08/07/19 0530 08/08/19 0418 08/09/19 0318 08/10/19 0317 08/11/19 0425  WBC 15.9*   < >  14.3* 10.8* 9.0 8.0 10.1  NEUTROABS 13.5*  --   12.1*  --   --   --   --   HGB 10.2*   < > 11.0* 11.3* 11.7* 12.1* 11.8*  HCT 32.6*   < > 33.4* 35.6* 35.3* 36.5* 37.9*  MCV 85.3   < > 82.3 85.2 81.5 82.2 85.6  PLT 194   < > 203 218 215 205 218   < > = values in this interval not displayed.   Cardiac Enzymes: No results for input(s): CKTOTAL, CKMB, CKMBINDEX, TROPONINI in the last 168 hours. BNP: Invalid input(s): POCBNP CBG: Recent Labs  Lab 08/10/19 1134 08/10/19 1621 08/10/19 2051 08/11/19 0741 08/11/19 1136  GLUCAP 250* 177* 151* 153* 185*   D-Dimer No results for input(s): DDIMER in the last 72 hours. Hgb A1c No results for input(s): HGBA1C in the last 72 hours. Lipid Profile No results for input(s): CHOL, HDL, LDLCALC, TRIG, CHOLHDL, LDLDIRECT in the last 72 hours. Thyroid function studies No results for input(s): TSH, T4TOTAL, T3FREE, THYROIDAB in the last 72 hours.  Invalid input(s): FREET3 Anemia work up No results for input(s): VITAMINB12, FOLATE, FERRITIN, TIBC, IRON, RETICCTPCT in the last 72 hours. Urinalysis    Component Value Date/Time   COLORURINE YELLOW (A) 04/06/2019 1954   APPEARANCEUR CLEAR (A) 04/06/2019 1954   APPEARANCEUR Clear 07/17/2018 1601   LABSPEC 1.012 04/06/2019 1954   LABSPEC 1.004 05/06/2014 2100   PHURINE 5.0 04/06/2019 [place] NEGATIVE 04/06/2019 1954   GLUCOSEU Negative 05/06/2014 2100   HGBUR NEGATIVE 04/06/2019 [place] NEGATIVE 04/06/2019 1954   BILIRUBINUR Negative 07/17/2018 1601   BILIRUBINUR Negative 05/06/2014 2100   KETONESUR NEGATIVE 04/06/2019 [place] NEGATIVE 04/06/2019 1954   NITRITE NEGATIVE 04/06/2019 1954   LEUKOCYTESUR NEGATIVE 04/06/2019 1954   LEUKOCYTESUR Negative 05/06/2014 2100   Sepsis Labs Invalid input(s): PROCALCITONIN,  WBC,  LACTICIDVEN Microbiology Recent Results (from the past 240 hour(s))  Culture, blood (routine x 2) Call MD if unable to obtain prior to antibiotics being given     Status: None   Collection Time:  08/06/19  5:38 AM   Specimen: BLOOD  Result Value Ref Range Status   Specimen Description BLOOD LEFT HAND  Final   Special Requests BOTTLES DRAWN AEROBIC AND ANAEROBIC [place]  Final   Culture   Final    NO GROWTH 5 DAYS Performed at [facility], [address] [place], [place] [address]    Report Status 08/11/2019 FINAL  Final  SARS Coronavirus 2 by RT PCR (hospital order, performed in [facility] hospital lab) Nasopharyngeal Nasopharyngeal Swab     Status: None   Collection Time: 08/06/19  5:38 AM   Specimen: Nasopharyngeal Swab  Result Value Ref Range Status   SARS Coronavirus 2 NEGATIVE NEGATIVE Final    Comment: (NOTE) SARS-CoV-2 target nucleic acids are NOT DETECTED.  The SARS-CoV-2 RNA is generally detectable in upper and lower respiratory specimens during the acute phase of infection. The lowest concentration of SARS-CoV-2 viral copies this assay can detect is 250 copies / mL. A negative result does not preclude SARS-CoV-2 infection and should not be used as the sole basis for treatment or other patient management decisions.  A negative result may occur with improper specimen collection / handling, submission of specimen other than nasopharyngeal swab, presence of viral mutation(s) within the areas targeted by this assay, and inadequate number of viral copies (<250 copies / mL). A negative result must  be combined with clinical observations, patient history, and epidemiological information.  Fact Sheet for Patients:   StrictlyIdeas.no  Fact Sheet for Healthcare Providers: BankingDealers.co.za  This test is not yet approved or  cleared by the [place] FDA and has been authorized for detection and/or diagnosis of SARS-CoV-2 by FDA under an Emergency Use Authorization (EUA).  This EUA will remain in effect (meaning this test can be used) for the duration of the COVID-19 declaration under Section 564(b)(1) of the  Act, 21 U.S.C. section 360bbb-3(b)(1), unless the authorization is terminated or revoked sooner.  Performed at [facility], [address] [place], [place] [address]      Time coordinating discharge: Over 30 minutes  SIGNED:   Wyvonnia Dusky, MD  [facility] 08/11/2019, 12:15 PM Pager   If 7PM-7AM, please contact night-coverage [email or web address]

## 2019-08-11 NOTE — Plan of Care (Signed)
  Problem: Clinical Measurements: Goal: Respiratory complications will improve Outcome: Progressing Goal: Cardiovascular complication will be avoided Outcome: Progressing   Problem: Elimination: Goal: Will not experience complications related to bowel motility Outcome: Progressing   Problem: Safety: Goal: Ability to remain free from injury will improve Outcome: Progressing   

## 2019-08-11 NOTE — Progress Notes (Signed)
[facility]  SUBJECTIVE: Patient laying in bed, reports feeling better overall   Vitals:   08/11/19 0428 08/11/19 0454 08/11/19 0719 08/11/19 0738  BP: (!) 170/139 (!) 118/94  100/67  Pulse: 83 70  65  Resp: 18   19  Temp: 98.2 F (36.8 C)   99 F (37.2 C)  TempSrc: Oral   Oral  SpO2: 96%  94% 98%  Weight:  82.7 kg    Height:         Intake/Output Summary (Last 24 hours) at 08/11/2019 0902 Last data filed at 08/10/2019 2229 Gross per 24 hour  Intake 360 ml  Output 1350 ml  Net -990 ml      PHYSICAL EXAM  General: Well developed, well nourished, in no acute distress HEENT:  Normocephalic and atramatic Neck:  No JVD.  Lungs: Clear bilaterally to auscultation and percussion. Heart: Irregular irregular rhythm. Normal S1 and S2 without gallops or murmurs.  Abdomen: Bowel sounds are positive, abdomen soft and non-tender  Msk:  Back normal, normal gait. Normal strength and tone for age. Extremities: No clubbing, cyanosis or edema.   Neuro: Alert and oriented X 3. Psych:  Good affect, responds appropriately   LABS: Basic Metabolic Panel: Recent Labs    08/10/19 0317 08/11/19 0425  NA 136 137  K 3.4* 3.8  CL 97* 102  CO2 30 30  GLUCOSE 166* 162*  BUN 23 19  CREATININE 1.16 1.16  CALCIUM 8.6* 8.5*  MG 2.0 2.1  PHOS 4.2 3.2   Liver Function Tests: No results for input(s): AST, ALT, ALKPHOS, BILITOT, PROT, ALBUMIN in the last 72 hours. No results for input(s): LIPASE, AMYLASE in the last 72 hours. CBC: Recent Labs    08/10/19 0317 08/11/19 0425  WBC 8.0 10.1  HGB 12.1* 11.8*  HCT 36.5* 37.9*  MCV 82.2 85.6  PLT 205 218   Cardiac Enzymes: No results for input(s): CKTOTAL, CKMB, CKMBINDEX, TROPONINI in the last 72 hours. BNP: Invalid input(s): POCBNP D-Dimer: No results for input(s): DDIMER in the last 72 hours. Hemoglobin A1C: No results for input(s): HGBA1C in the last 72 hours. Fasting Lipid Panel: No results for input(s): CHOL, HDL, LDLCALC,  TRIG, CHOLHDL, LDLDIRECT in the last 72 hours. Thyroid Function Tests: No results for input(s): TSH, T4TOTAL, T3FREE, THYROIDAB in the last 72 hours.  Invalid input(s): FREET3 Anemia Panel: No results for input(s): VITAMINB12, FOLATE, FERRITIN, TIBC, IRON, RETICCTPCT in the last 72 hours.  No results found.   Echo LVEF greater than 55%, moderate severe TR, RVSP 64 mmHg 04/29/2019  TELEMETRY: Atrial fibrillation at 90 to 100 bpm:  ASSESSMENT AND PLAN:  Principal Problem:   Acute respiratory failure with hypoxia (HCC) Active Problems:   GI bleeding   Type 2 diabetes mellitus without complication (HCC)   OSA (obstructive sleep apnea)   Atrial fibrillation, chronic (HCC)   Dementia without behavioral disturbance (HCC)   Acute on chronic diastolic CHF (congestive heart failure) (HCC)   History of GI bleed   History of bradycardia   Moderate tricuspid regurgitation   PAH (pulmonary artery hypertension) ([place])   History of CVA (cerebrovascular accident)   LLL pneumonia   HCAP (healthcare-associated pneumonia)   Acute CHF (congestive heart failure) ([place])    1.  Atrial fibrillation with RVR, on low-dose Eliquis for stroke prevention, overall better controlled, however continues to have episodes of RVR especially with exertion, currently on metoprolol tartrate 12.5 mg twice daily 2.  Tachybradycardia syndrome, with atrial fibrillation with RVR  alternating with sinus bradycardia and intermittent sinus pauses, currently appears stable, being considered for permanent pacemaker, currently deferred due to underlying pneumonia, and concerns about dementia 3.  Acute respiratory failure with hypoxia, secondary to left lower lobe pneumonia 4.  Acute on chronic diastolic congestive heart failure, improved on IV Lasix 5.  COPD 6.  Pulmonary hypertension secondary to COPD 7.  History of GI bleed 06/2019, hemoglobin hematocrit currently stable on low-dose Eliquis 8.  Dementia  Recommendations  1.   Agree with current therapy 2.  Continue low-dose Eliquis for stroke prevention 3.  Uptitrate metoprolol 25 mg twice daily for better rate control 4.  Continue antibiotics for underlying pneumonia 5.  Defer permanent pacemaker implantation at this time 6.  Follow-up as outpatient in 1 week after discharge   Isaias Cowman, MD, PhD, [facility] 08/11/2019 9:02 AM

## 2019-08-11 NOTE — Progress Notes (Signed)
Patient, patients wife, and family member educated on discharge instructions and verbalized understanding. Patient will be driven home by family. Patient provided with bedside commode to take home.

## 2019-08-20 IMAGING — CR DG CHEST 2V
1 series · 2 of 2 positions shown · non-contrast
Comparison: 12/02/2017.

CLINICAL DATA: Wheezing.

EXAM:
CHEST - 2 VIEW

[Series 1: dg chest 2 view · 0.14mm/px · 2 of 2 slices shown]
[im 1/2]
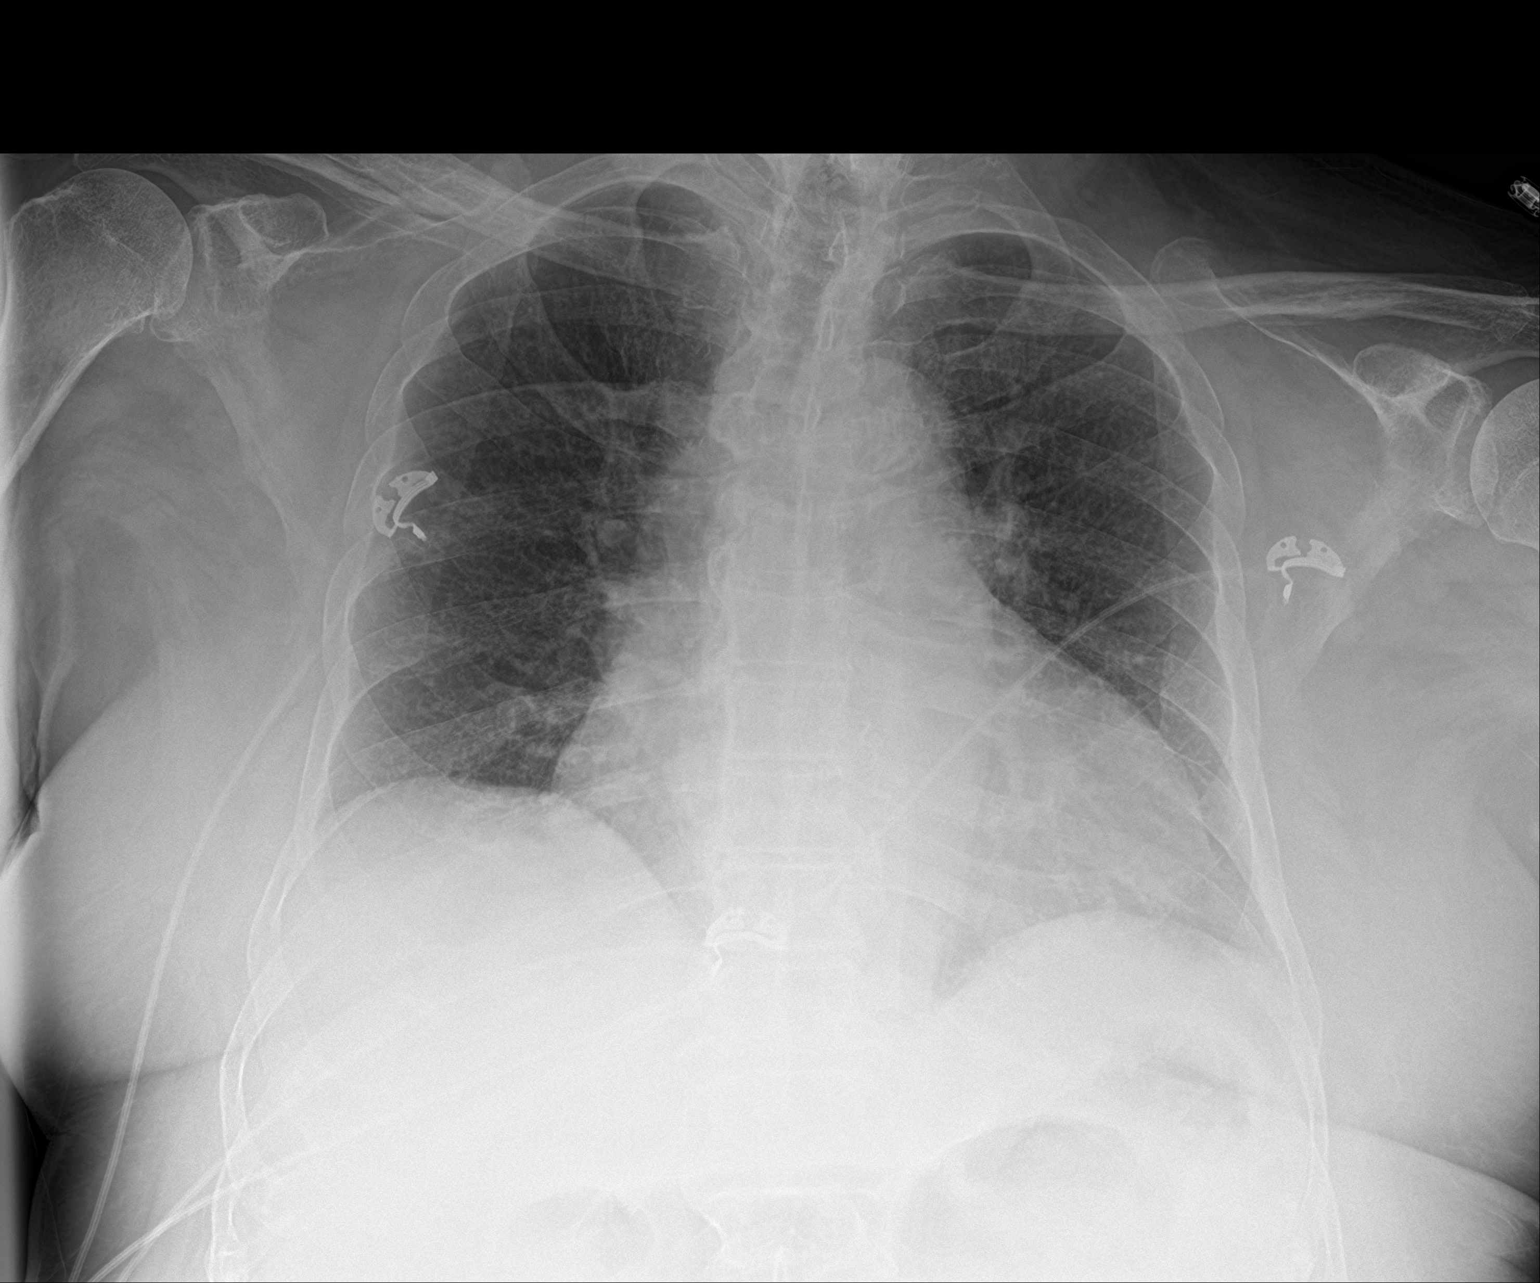
[im 2/2]
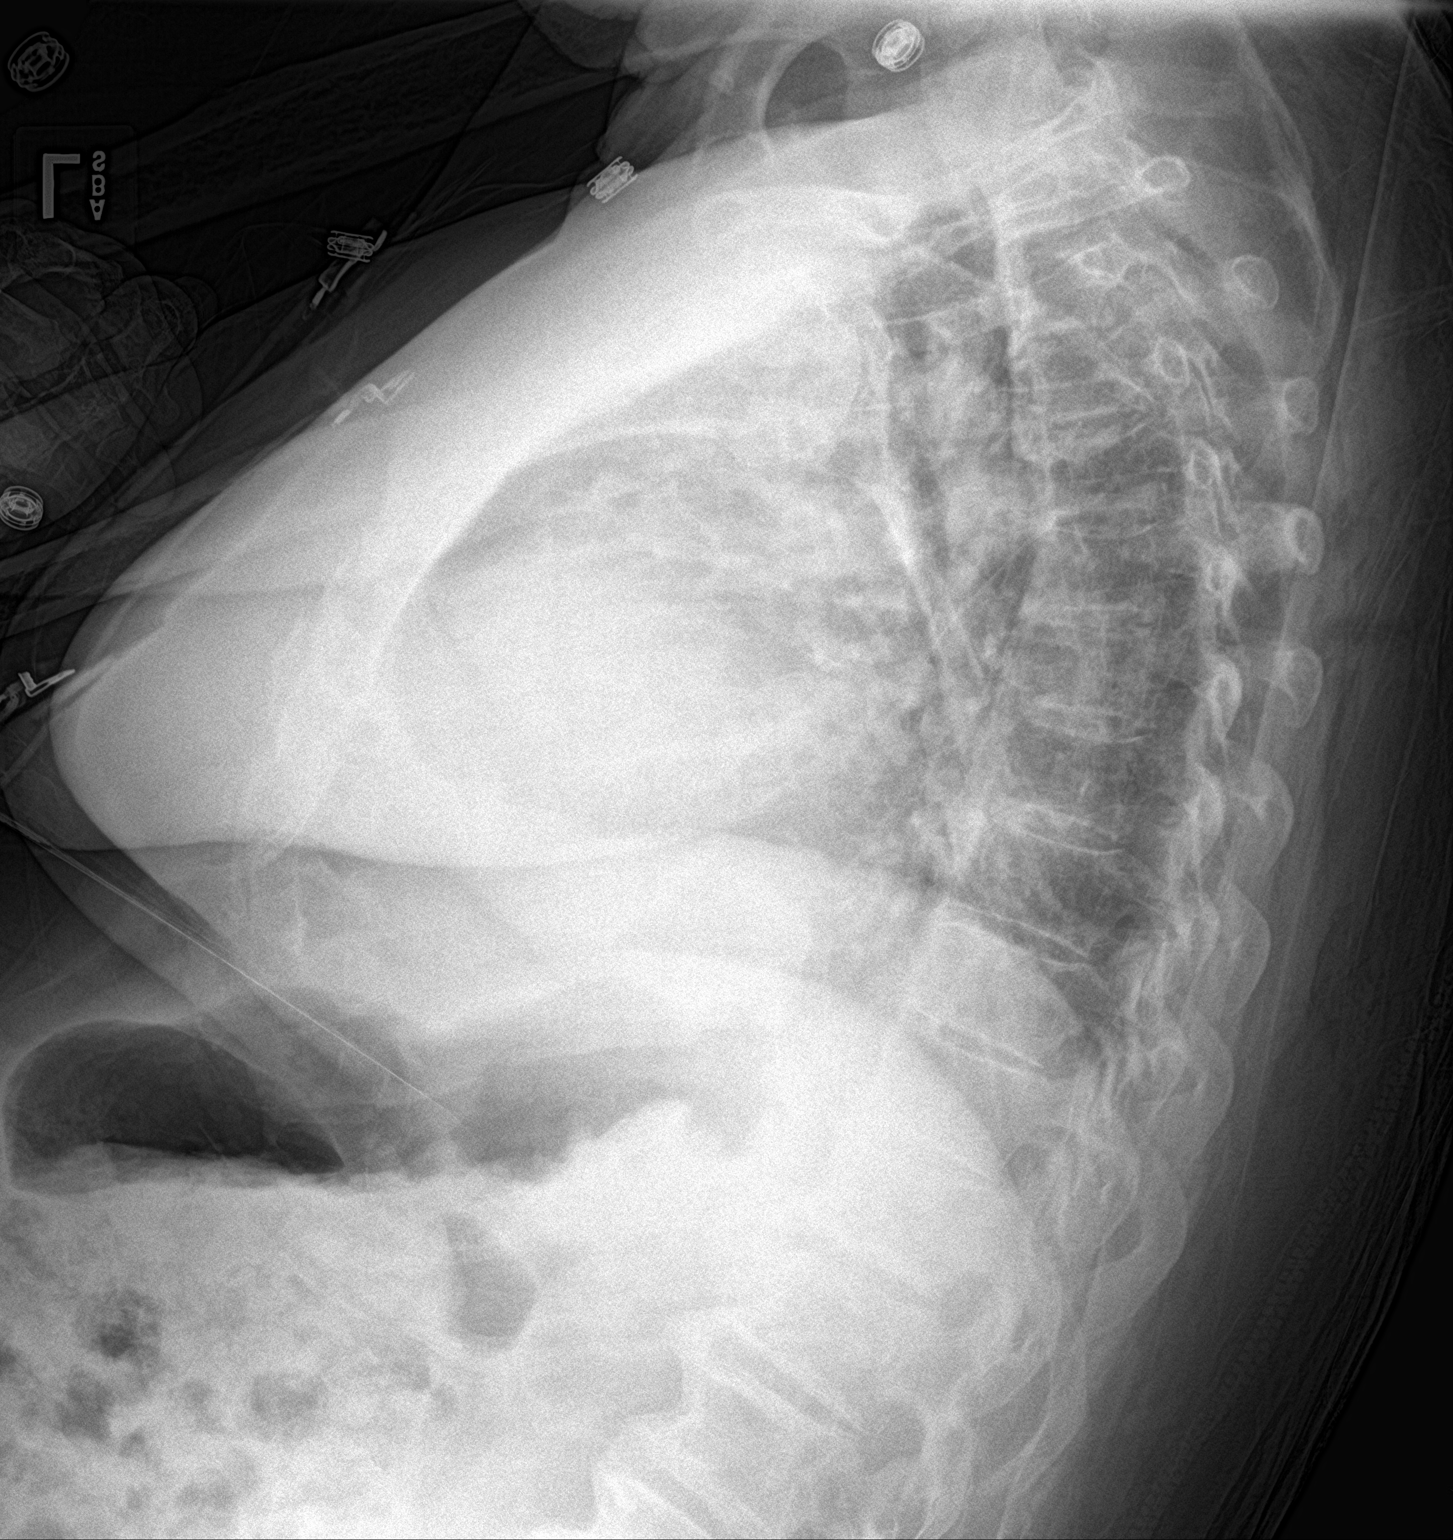

[2 of 2 positions shown; findings below may reference images not displayed]

FINDINGS: Mediastinum hilar structures normal. Cardiomegaly with normal
pulmonary vascularity. No focal alveolar infiltrate. Low lung
volumes. No pleural effusion or pneumothorax. Degenerative change
thoracic spine.
IMPRESSION: 1. Severe cardiomegaly. Normal pulmonary vascularity. No focal
infiltrate noted.

2.  Mild basilar atelectasis.

## 2019-08-20 IMAGING — CT CT HEAD W/O CM
3 series · 15 of 47 positions shown, 18 images · non-contrast
Comparison: 12/02/2017 brain MR and head CT.

CLINICAL DATA: 77-year-old male with acute infarct posterior limb
right internal capsule. Subsequent encounter.

EXAM:
CT HEAD WITHOUT CONTRAST
TECHNIQUE: Contiguous axial images were obtained from the base of the skull
through the vertex without intravenous contrast.

[Series 2: head wo · axial · 0.40mm/px · z∈[-156,-31]mm · 9 of 31 slices shown, 12 images]
[im 3/31  brain]
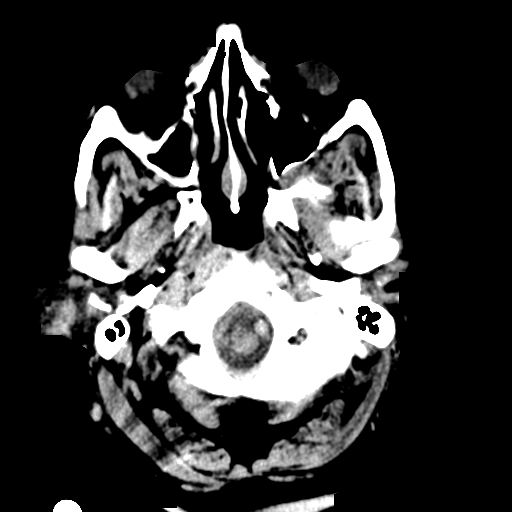
[im 3/31  bone]
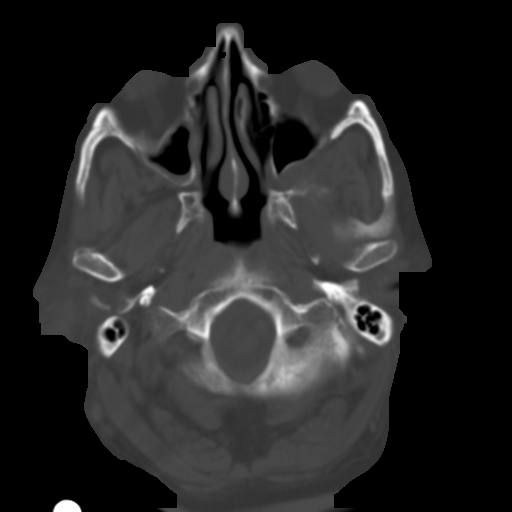
[im 6/31  brain]
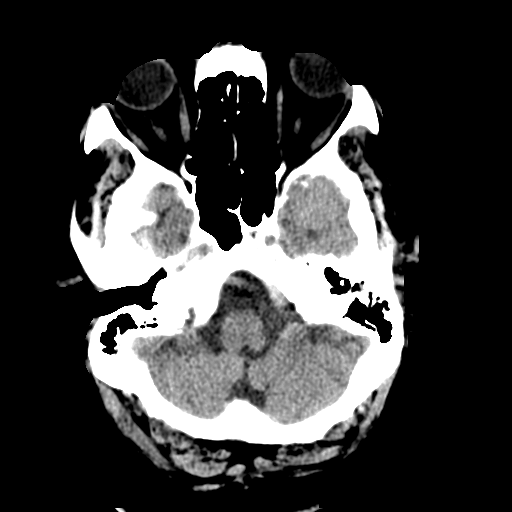
[im 9/31  brain]
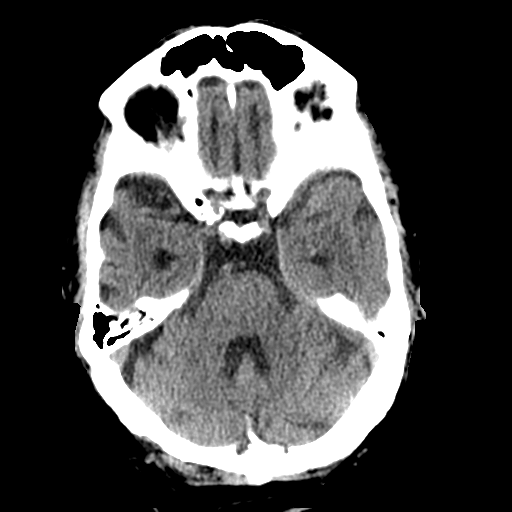
[im 12/31  brain]
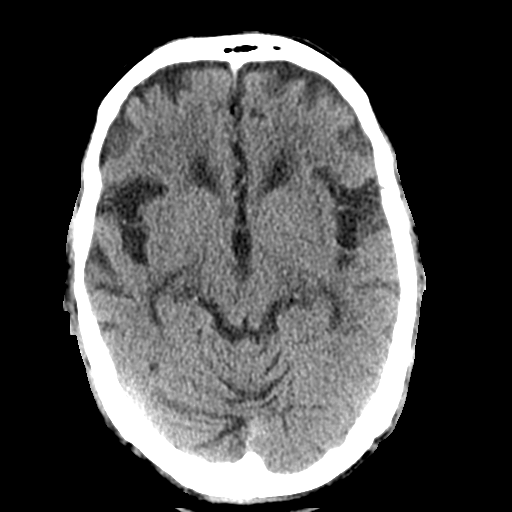
[im 16/31  brain]
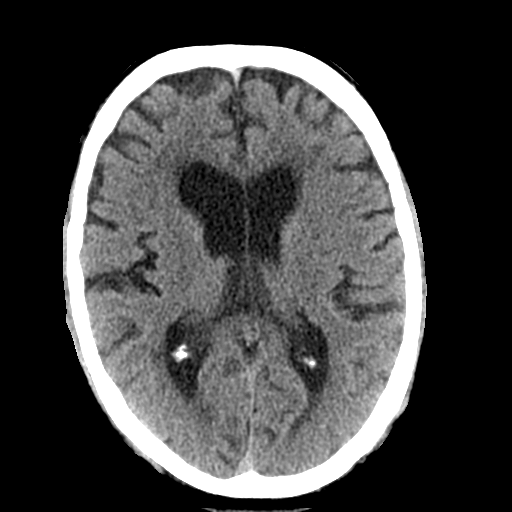
[im 16/31  bone]
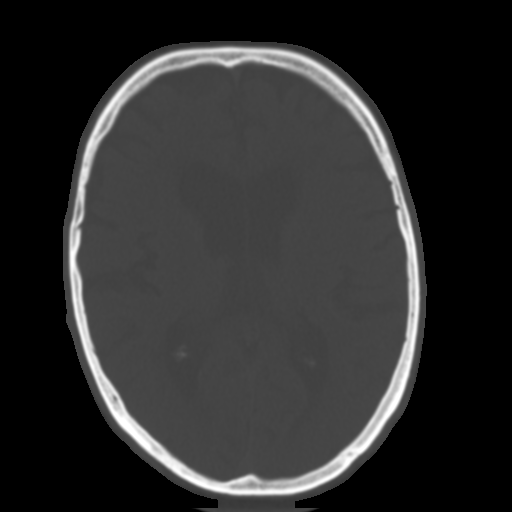
[im 19/31  brain]
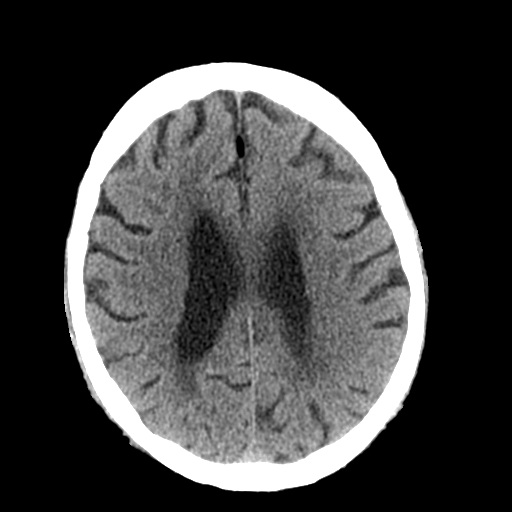
[im 22/31  brain]
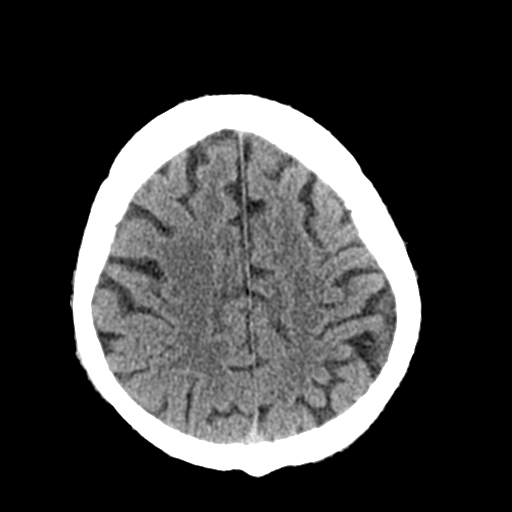
[im 25/31  brain]
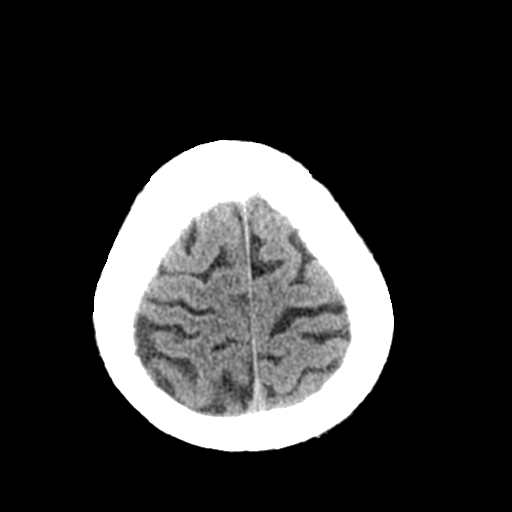
[im 28/31  brain]
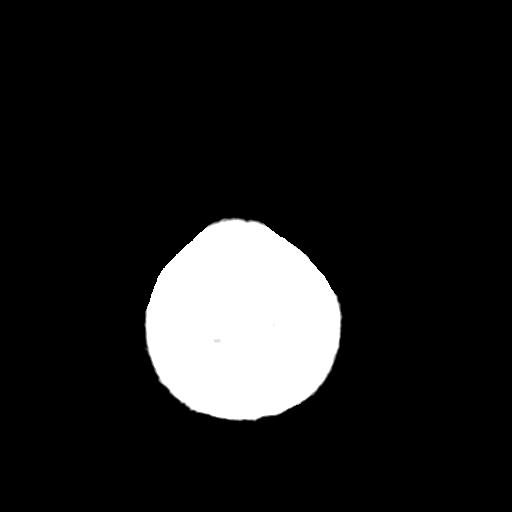
[im 28/31  bone]
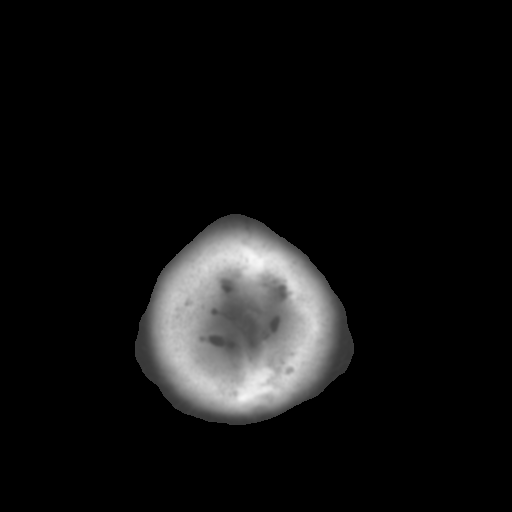

[Series 4: coronal soft tissue · coronal · 0.31mm/px · 3 of 68 slices shown]
[im 23/68  brain]
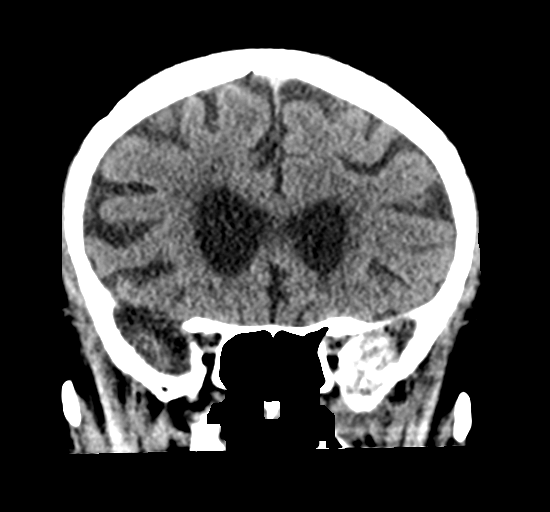
[im 30/68  brain]
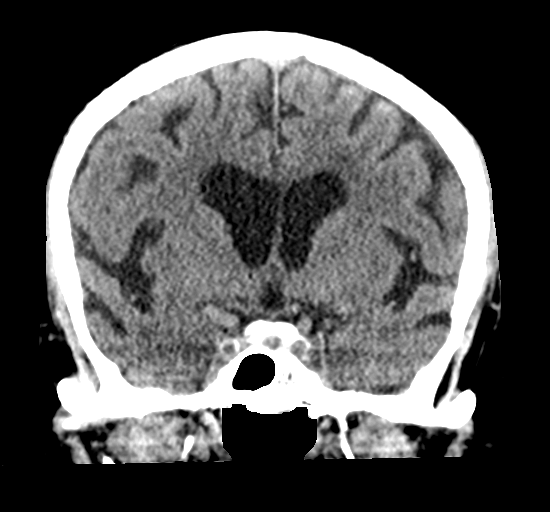
[im 38/68  brain]
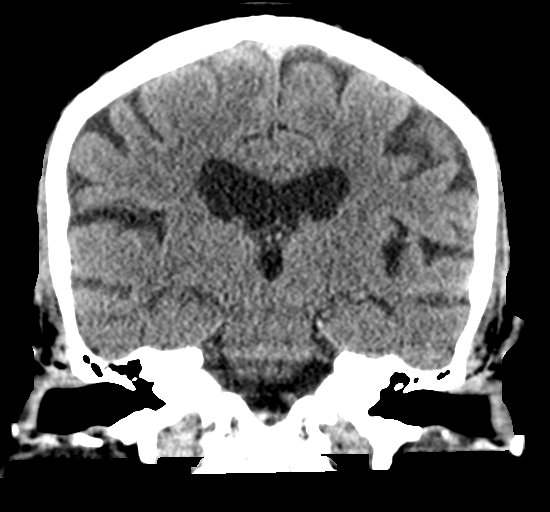

[Series 5: sagittal soft tissue · sagittal · 0.32mm/px · 3 of 57 slices shown]
[im 19/57  brain]
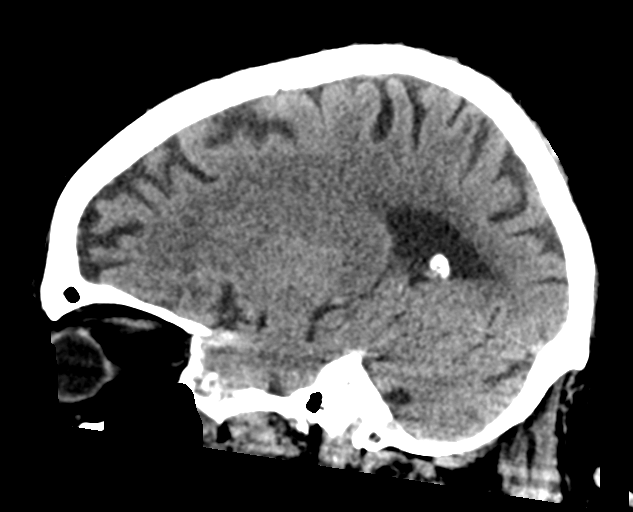
[im 29/57  brain]
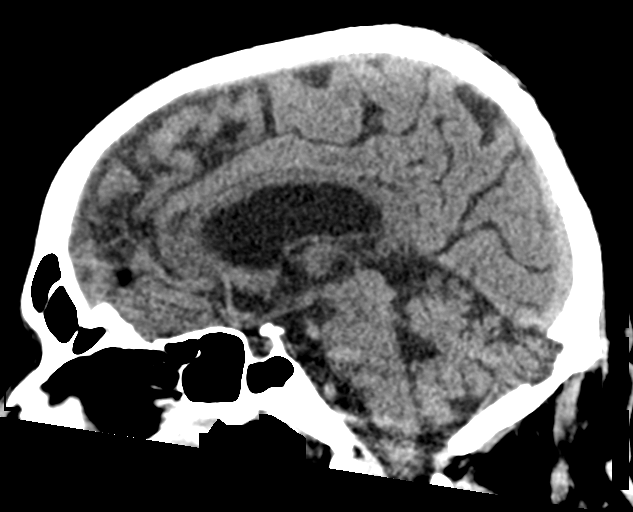
[im 38/57  brain]
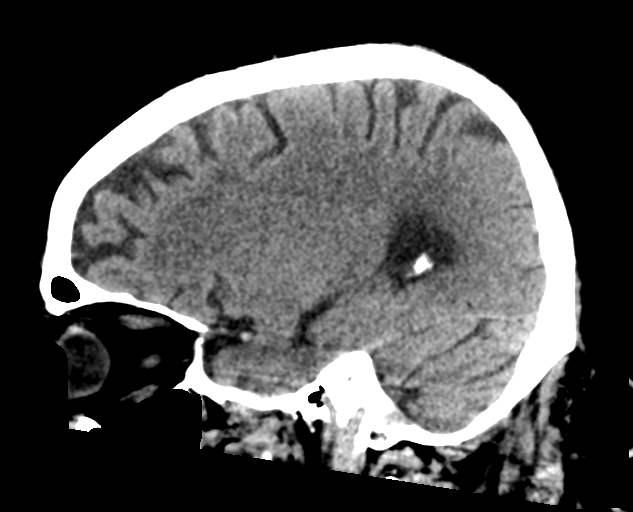

[15 of 47 positions shown; findings below may reference images not displayed]

FINDINGS: Brain: Acute nonhemorrhagic infarct posterior limb right internal
capsule. Chronic microvascular changes. No intracranial hemorrhage.
2.4 cm anterior left middle cranial fossa meningioma displaces
adjacent brain without associated vasogenic edema.

Vascular: Vascular calcifications.

Skull: No skull fracture detected. Slight irregularity left greater
wing of sphenoid adjacent to meningioma.

Sinuses/Orbits: Remote right inferior orbital floor fracture.
Mucosal thickening right maxillary sinus.

Other: Mastoid air cells and middle ear cavities are clear.
IMPRESSION: 1. Acute nonhemorrhagic infarct posterior limb right internal
capsule.
2. Chronic microvascular changes.
3. 2.4 cm anterior left middle cranial fossa meningioma displaces
adjacent brain without associated vasogenic edema.
4. Remote right inferior orbital floor fracture.
5. Mucosal thickening right maxillary sinus.

## 2019-08-27 ENCOUNTER — Encounter: Payer: Self-pay | Admitting: Family

## 2019-08-27 ENCOUNTER — Ambulatory Visit: Payer: Medicare Other | Attending: Family | Admitting: Family

## 2019-08-27 ENCOUNTER — Other Ambulatory Visit: Payer: Self-pay

## 2019-08-27 VITALS — BP 108/71 | HR 69 | Resp 18 | Ht 65.0 in | Wt 187.2 lb

## 2019-08-27 DIAGNOSIS — F0390 Unspecified dementia without behavioral disturbance: Secondary | ICD-10-CM | POA: Diagnosis not present

## 2019-08-27 DIAGNOSIS — E785 Hyperlipidemia, unspecified: Secondary | ICD-10-CM | POA: Insufficient documentation

## 2019-08-27 DIAGNOSIS — I272 Pulmonary hypertension, unspecified: Secondary | ICD-10-CM | POA: Diagnosis not present

## 2019-08-27 DIAGNOSIS — I2510 Atherosclerotic heart disease of native coronary artery without angina pectoris: Secondary | ICD-10-CM | POA: Diagnosis not present

## 2019-08-27 DIAGNOSIS — Z8673 Personal history of transient ischemic attack (TIA), and cerebral infarction without residual deficits: Secondary | ICD-10-CM | POA: Diagnosis not present

## 2019-08-27 DIAGNOSIS — E039 Hypothyroidism, unspecified: Secondary | ICD-10-CM | POA: Insufficient documentation

## 2019-08-27 DIAGNOSIS — Z87891 Personal history of nicotine dependence: Secondary | ICD-10-CM | POA: Insufficient documentation

## 2019-08-27 DIAGNOSIS — I480 Paroxysmal atrial fibrillation: Secondary | ICD-10-CM | POA: Diagnosis not present

## 2019-08-27 DIAGNOSIS — I110 Hypertensive heart disease with heart failure: Secondary | ICD-10-CM | POA: Diagnosis not present

## 2019-08-27 DIAGNOSIS — Z7901 Long term (current) use of anticoagulants: Secondary | ICD-10-CM | POA: Insufficient documentation

## 2019-08-27 DIAGNOSIS — Z79899 Other long term (current) drug therapy: Secondary | ICD-10-CM | POA: Diagnosis not present

## 2019-08-27 DIAGNOSIS — E119 Type 2 diabetes mellitus without complications: Secondary | ICD-10-CM | POA: Insufficient documentation

## 2019-08-27 DIAGNOSIS — I10 Essential (primary) hypertension: Secondary | ICD-10-CM

## 2019-08-27 DIAGNOSIS — J45909 Unspecified asthma, uncomplicated: Secondary | ICD-10-CM | POA: Insufficient documentation

## 2019-08-27 DIAGNOSIS — J449 Chronic obstructive pulmonary disease, unspecified: Secondary | ICD-10-CM | POA: Insufficient documentation

## 2019-08-27 DIAGNOSIS — I5032 Chronic diastolic (congestive) heart failure: Secondary | ICD-10-CM | POA: Insufficient documentation

## 2019-08-27 DIAGNOSIS — G479 Sleep disorder, unspecified: Secondary | ICD-10-CM | POA: Diagnosis not present

## 2019-08-27 DIAGNOSIS — K219 Gastro-esophageal reflux disease without esophagitis: Secondary | ICD-10-CM | POA: Insufficient documentation

## 2019-08-27 DIAGNOSIS — Z794 Long term (current) use of insulin: Secondary | ICD-10-CM | POA: Diagnosis not present

## 2019-08-27 LAB — GLUCOSE, CAPILLARY: Glucose-Capillary: 170 mg/dL — ABNORMAL HIGH (ref 70–99)

## 2019-08-27 NOTE — Patient Instructions (Addendum)
Continue weighing daily and call for an overnight weight gain of > 2 pounds or a weekly weight gain of >5 pounds.   Call us in the future if you'd like to schedule another appointment 

## 2019-08-27 NOTE — Progress Notes (Signed)
Patient ID: Derek Shelton, male    DOB: December 04, 1940, 79 y.o.   MRN: [id number]  Interpreter present during the entire visit  HPI  Derek Shelton is a 79 y/o male with a history of asthma, CAD, DM, hyperlipidemia, HTN, stroke, thyroid disease, sleep apnea, dementia, GERD, COPD, anemia, previous tobacco use and chronic heart failure.   Echo report from 04/29/19 reviewed and showed an EF of >55% along with moderate/severe TR, moderate pulmonary HTN and mild LAE/RAE.   Admitted 08/06/19 due to acute respiratory failure. Cardiology consult obtained. Initially needed bipap but then able to be weaned off of it. Chest CTA negative for PE but did show pneumonia. Given antibiotics.  Discharged after 5 days.   He presents today for a chief complaint of an initial visit. History is entirely obtained by the son as patient has dementia and is unable to answer the majority of the questions. Son denies that patient has any difficulty sleeping, dizziness, swelling in legs/ abdomen, cough, shortness of breath or weight gain. Does feel like patient has some difficulty sleeping.   Discussion has been had about a pacemaker for his atrial fibrillation but family is currently holding off on that right now.   Past Medical History:  Diagnosis Date  . Anemia   . Asthma   . CHF (congestive heart failure) ([place])   . COPD (chronic obstructive pulmonary disease) ([place])   . Coronary artery disease   . Dementia ([place])    Per son's report  . Diabetes mellitus without complication ([place])   . Dysrhythmia    Atrial Fibrillation  . GERD (gastroesophageal reflux disease)   . GI bleed   . Hyperlipidemia   . Hypertension   . Hypothyroidism   . Shortness of breath dyspnea   . Sleep apnea   . Stroke ([place])   . Thyroid disease    Past Surgical History:  Procedure Laterality Date  . CARDIAC CATHETERIZATION    . COLONOSCOPY N/A 08/10/2014   Procedure: COLONOSCOPY;  Surgeon: Manya Silvas, MD;  Location: [facility] ENDOSCOPY;   Service: Endoscopy;  Laterality: N/A;  . COLONOSCOPY WITH PROPOFOL N/A 04/05/2016   Procedure: COLONOSCOPY WITH PROPOFOL;  Surgeon: Lollie Sails, MD;  Location: [facility] ENDOSCOPY;  Service: Endoscopy;  Laterality: N/A;  . COLONOSCOPY WITH PROPOFOL N/A 07/10/2019   Procedure: COLONOSCOPY WITH PROPOFOL;  Surgeon: Lucilla Lame, MD;  Location: [facility] ENDOSCOPY;  Service: Endoscopy;  Laterality: N/A;  . ESOPHAGOGASTRODUODENOSCOPY N/A 08/08/2014   Procedure: ESOPHAGOGASTRODUODENOSCOPY (EGD);  Surgeon: Manya Silvas, MD;  Location[facility] ENDOSCOPY;  Service: Endoscopy;  Laterality: N/A;  plan for early afternoon  . ESOPHAGOGASTRODUODENOSCOPY (EGD) WITH PROPOFOL N/A 07/10/2019   Procedure: ESOPHAGOGASTRODUODENOSCOPY (EGD) WITH PROPOFOL;  Surgeon: Lucilla Lame, MD;  Location: [facility] ENDOSCOPY;  Service: Endoscopy;  Laterality: N/A;  . EYE SURGERY    . GIVENS CAPSULE STUDY N/A 08/12/2014   Procedure: GIVENS CAPSULE STUDY;  Surgeon: Manya Silvas, MD;  Location: [facility] ENDOSCOPY;  Service: Endoscopy;  Laterality: N/A;  . rectal fistula N/A    Family History  Problem Relation Age of Onset  . Diabetes Mother    Social History   Tobacco Use  . Smoking status: Former Smoker    Quit date: 2000    Years since quitting: 21.6  . Smokeless tobacco: Former Systems developer    Types: Chew  Substance Use Topics  . Alcohol use: No   Allergies  Allergen Reactions  . Carvedilol Shortness Of Breath    Other reaction(s): Asthma, Shortness  of Breath  . Penicillins Shortness Of Breath    Other reaction(s): Other (See Comments) Other reaction(s): RASH Other reaction(s): RASH Other reaction(s): RASH   . Aspirin     Other reaction(s): Other (See Comments) On eliquis already- high risk of bleeding  . Aricept [Donepezil Hcl] Other (See Comments)    Medication cause significant bradycardia  . Catapres [Clonidine Hcl]    Prior to Admission medications   Medication Sig Start Date End Date Taking? Authorizing Provider   apixaban (ELIQUIS) 2.5 MG TABS tablet Take 1 tablet (2.5 mg total) by mouth 2 (two) times daily. 07/10/19  Yes Lorella Nimrod, MD  atorvastatin (LIPITOR) 40 MG tablet Take 1 tablet (40 mg total) by mouth daily at 6 PM. 12/03/17  Yes Demetrios Loll, MD  budesonide-formoterol [facility]) 160-4.5 MCG/ACT inhaler Inhale 2 puffs into the lungs 2 (two) times daily.   Yes [provider]  digoxin (LANOXIN) 0.125 MG tablet Take 1 tablet (0.125 mg total) by mouth daily. Patient taking differently: Take 0.125 mg by mouth. TAKE ONCE A DAY EVERY OTHER DAY. 12/08/17  Yes Sudini, Alveta Heimlich, MD  donepezil (ARICEPT) 5 MG tablet Take 5 mg by mouth at bedtime.   Yes [provider]  DULoxetine (CYMBALTA) 60 MG capsule Take 60 mg by mouth daily.   Yes [provider]  ergocalciferol (VITAMIN D2) 1.25 MG (50000 UT) capsule Take 50,000 Units by mouth once a week.   Yes [provider]  ferrous sulfate 325 (65 FE) MG EC tablet Take 1 tablet (325 mg total) by mouth 2 (two) times daily. Patient taking differently: Take 325 mg by mouth daily.  07/11/19 07/10/20 Yes Lorella Nimrod, MD  finasteride (PROSCAR) 5 MG tablet Take 1 tablet (5 mg total) by mouth daily. 07/18/18  Yes Stoioff, Ronda Fairly, MD  furosemide (LASIX) 20 MG tablet Take 40 mg by mouth daily.    Yes [provider]  insulin degludec (TRESIBA FLEXTOUCH) 100 UNIT/ML SOPN FlexTouch Pen Inject 30 Units into the skin at bedtime.    Yes [provider]  ipratropium-albuterol (DUONEB) 0.5-2.5 (3) MG/3ML SOLN Inhale 3 mLs into the lungs every 6 (six) hours as needed for shortness of breath. 01/22/17  Yes [provider]  latanoprost (XALATAN) 0.005 % ophthalmic solution latanoprost 0.005 % eye drops  INSTILL ONE DROP TO BOTH EYES AT BEDTIME.   Yes [provider]  levothyroxine (SYNTHROID, LEVOTHROID) 112 MCG tablet Take 112 mcg by mouth daily before breakfast.    Yes [provider]  Melatonin 3 MG  TABS Take 3 mg by mouth at bedtime as needed (sleep).    Yes [provider]  metoprolol tartrate (LOPRESSOR) 25 MG tablet Take 25 mg by mouth 2 (two) times daily.    Yes [provider]  montelukast (SINGULAIR) 10 MG tablet Take 10 mg by mouth daily.    Yes [provider]  omega-3 acid ethyl esters (LOVAZA) 1 G capsule Take 1 g by mouth daily.    Yes [provider]  pantoprazole (PROTONIX) 40 MG tablet Take 40 mg by mouth daily. 06/04/19  Yes [provider]  potassium chloride SA (K-DUR,KLOR-CON) 20 MEQ tablet Take 10 mEq by mouth 3 (three) times a week. Take 1/2 tablets (10MEQ) by mouth twice daily for 3 days as directed Mon., Wed., Fri. 12/01/17  Yes [provider]  sildenafil (REVATIO) 20 MG tablet Take 20 mg by mouth 3 (three) times daily. 05/09/19  Yes [provider]  tamsulosin (  FLOMAX) 0.4 MG CAPS capsule Take 0.4 mg by mouth daily. 05/07/19  Yes [provider]  tiotropium (SPIRIVA) 18 MCG inhalation capsule Place 18 mcg into inhaler and inhale daily.   Yes [provider]    Review of Systems  Unable to perform ROS: Dementia    Vitals:   08/27/19 1125  BP: 108/71  Pulse: 69  Resp: 18  SpO2: 96%  Weight: 187 lb 4 oz (84.9 kg)  Height: 5\' 5"  (1.651 m)   Wt Readings from Last 3 Encounters:  08/27/19 187 lb 4 oz (84.9 kg)  08/11/19 182 lb 6.2 oz (82.7 kg)  07/16/19 208 lb (94.3 kg)   Lab Results  Component Value Date   CREATININE 1.16 08/11/2019   CREATININE 1.16 08/10/2019   CREATININE 1.15 08/09/2019    Physical Exam Vitals and nursing note reviewed.  Constitutional:      Appearance: Normal appearance.  HENT:     Head: Normocephalic and atraumatic.  Cardiovascular:     Rate and Rhythm: Normal rate and regular rhythm.  Pulmonary:     Effort: Pulmonary effort is normal. No respiratory distress.     Breath sounds: No wheezing or rales.  Abdominal:     General: There is no  distension.     Palpations: Abdomen is soft.     Tenderness: There is no abdominal tenderness.  Musculoskeletal:        General: No tenderness.     Cervical back: Normal range of motion and neck supple.     Right lower leg: No edema.     Left lower leg: No edema.  Skin:    General: Skin is warm and dry.  Neurological:     Mental Status: He is alert. Mental status is at baseline.  Psychiatric:        Mood and Affect: Mood normal.        Cognition and Memory: Cognition is impaired.    Assessment & Plan:  1: Chronic heart failure with preserved ejection fraction- - NYHA class I - euvolemic today - weighing daily; advised to call for an overnight weight gain of >2 pounds or a weekly weight gain of >5 pounds - not using much salt although doesn't always eat a whole lot; reviewed the importance of closely following a low sodium diet - BNP 08/06/19 was 260.4  2: HTN- - BP looks good today - saw PCP Tressia Miners) 08/14/19 - BMP 08/11/19 reviewed and showed sodium 137, potassium 3.8, creatinine 1.16 and GFR 59  3: Paroxysmal atrial fibrillation- - saw cardiology ([place]) 08/26/19 - discussion had about pacemaker but family is interested at this tima  4: DM- - nonfasting glucose in clinic today was 170   Patient did not bring his medications nor a list. Each medication was verbally reviewed with the patient and he was encouraged to bring the bottles to every visit to confirm accuracy of list.  Due to HF stability, will not make a return appointment at this time. Son was advised that he could call back at anytime to schedule another appointment. Son was comfortable with this plan.

## 2019-09-02 ENCOUNTER — Encounter: Payer: Self-pay | Admitting: Gastroenterology

## 2019-09-02 ENCOUNTER — Other Ambulatory Visit: Payer: Self-pay

## 2019-09-02 ENCOUNTER — Other Ambulatory Visit: Payer: Self-pay | Admitting: Urology

## 2019-09-02 ENCOUNTER — Ambulatory Visit (INDEPENDENT_AMBULATORY_CARE_PROVIDER_SITE_OTHER): Payer: Medicare Other | Admitting: Gastroenterology

## 2019-09-02 VITALS — BP 114/73 | HR 123 | Temp 96.5°F | Ht 65.0 in | Wt 193.6 lb

## 2019-09-02 DIAGNOSIS — K922 Gastrointestinal hemorrhage, unspecified: Secondary | ICD-10-CM

## 2019-09-02 NOTE — Progress Notes (Signed)
Primary Care Physician: Gladstone Lighter, MD  Primary Gastroenterologist:  Dr. Lucilla Lame  Chief Complaint  Patient presents with  . Hospitalization Follow-up    HPI: Derek Shelton is a 79 y.o. male here for follow-up after being in the splint for anemia.  The patient underwent an EGD and colonoscopy which did not show any cause for his anemia.  The patient was then recommended to undergo a capsule endoscopy but started to have symptomatic bradycardia and was transferred to the ICU.  He was then discharged home with follow-up with me today.  There was a report that the patient did have a capsule endoscopy in 2016 with a report of an AVM. The patient comes in quite tired today and his son states that he did not sleep much last night because his activation was broken.  The patient's most recent hemoglobin was close to 12.  There is no sign of any black stools or bloody stools and no signs of symptomatic anemia.  Past Medical History:  Diagnosis Date  . Anemia   . Asthma   . CHF (congestive heart failure) ([place])   . COPD (chronic obstructive pulmonary disease) ([place])   . Coronary artery disease   . Dementia ([place])    Per son's report  . Diabetes mellitus without complication ([place])   . Dysrhythmia    Atrial Fibrillation  . GERD (gastroesophageal reflux disease)   . GI bleed   . Hyperlipidemia   . Hypertension   . Hypothyroidism   . Shortness of breath dyspnea   . Sleep apnea   . Stroke ([place])   . Thyroid disease     Current Outpatient Medications  Medication Sig Dispense Refill  . ACCU-CHEK GUIDE test strip E11.9   TO CHECK BLOOD GLUCOSE TWICE A DAY    . apixaban (ELIQUIS) 2.5 MG TABS tablet Take 1 tablet (2.5 mg total) by mouth 2 (two) times daily. 60 tablet 1  . atorvastatin (LIPITOR) 40 MG tablet Take 1 tablet (40 mg total) by mouth daily at 6 PM. 30 tablet 1  . BD PEN NEEDLE NANO 2ND GEN 32G X 4 MM MISC USE NIGHTLY WITH TRESIBA    . budesonide-formoterol (SYMBICORT)  160-4.5 MCG/ACT inhaler Inhale 2 puffs into the lungs 2 (two) times daily.    . digoxin (LANOXIN) 0.125 MG tablet Take 1 tablet (0.125 mg total) by mouth daily. (Patient taking differently: Take 0.125 mg by mouth. TAKE ONCE A DAY EVERY OTHER DAY.)    . donepezil (ARICEPT) 5 MG tablet Take 5 mg by mouth at bedtime.    . DULoxetine (CYMBALTA) 60 MG capsule Take 60 mg by mouth daily.    . ergocalciferol (VITAMIN D2) 1.25 MG (50000 UT) capsule Take 50,000 Units by mouth once a week.    . ferrous sulfate 325 (65 FE) MG EC tablet Take 1 tablet (325 mg total) by mouth 2 (two) times daily. (Patient taking differently: Take 325 mg by mouth daily. ) 60 tablet 3  . finasteride (PROSCAR) 5 MG tablet Take 1 tablet (5 mg total) by mouth daily. 90 tablet 3  . furosemide (LASIX) 20 MG tablet Take 40 mg by mouth daily.     . insulin degludec (TRESIBA FLEXTOUCH) 100 UNIT/ML SOPN FlexTouch Pen Inject 30 Units into the skin at bedtime.     Marland Kitchen ipratropium-albuterol (DUONEB) 0.5-2.5 (3) MG/3ML SOLN Inhale 3 mLs into the lungs every 6 (six) hours as needed for shortness of breath.  3  . latanoprost (  XALATAN) 0.005 % ophthalmic solution latanoprost 0.005 % eye drops  INSTILL ONE DROP TO BOTH EYES AT BEDTIME.    Marland Kitchen levothyroxine (SYNTHROID, LEVOTHROID) 112 MCG tablet Take 112 mcg by mouth daily before breakfast.     . Melatonin 3 MG TABS Take 3 mg by mouth at bedtime as needed (sleep).     . metoprolol tartrate (LOPRESSOR) 25 MG tablet Take 25 mg by mouth 2 (two) times daily.     . montelukast (SINGULAIR) 10 MG tablet Take 10 mg by mouth daily.     Marland Kitchen omega-3 acid ethyl esters (LOVAZA) 1 G capsule Take 1 g by mouth daily.     . pantoprazole (PROTONIX) 40 MG tablet Take 40 mg by mouth daily.    . potassium chloride SA (K-DUR,KLOR-CON) 20 MEQ tablet Take 10 mEq by mouth 3 (three) times a week. Take 1/2 tablets (10MEQ) by mouth twice daily for 3 days as directed Mon., Wed., Fri.    . sildenafil (REVATIO) 20 MG tablet Take 20  mg by mouth 3 (three) times daily.    . tamsulosin (FLOMAX) 0.4 MG CAPS capsule Take 0.4 mg by mouth daily.    Marland Kitchen tiotropium (SPIRIVA) 18 MCG inhalation capsule Place 18 mcg into inhaler and inhale daily.     No current facility-administered medications for this visit.    Allergies as of 09/02/2019 - Review Complete 09/02/2019  Allergen Reaction Noted  . Carvedilol Shortness Of Breath 05/02/2014  . Penicillins Shortness Of Breath 12/10/2012  . Aspirin  05/07/2019  . Aricept [donepezil hcl] Other (See Comments) 07/09/2019  . Catapres [clonidine hcl]  04/04/2016    ROS:  General: Negative for anorexia, weight loss, fever, chills, fatigue, weakness. ENT: Negative for hoarseness, difficulty swallowing , nasal congestion. CV: Negative for chest pain, angina, palpitations, dyspnea on exertion, peripheral edema.  Respiratory: Negative for dyspnea at rest, dyspnea on exertion, cough, sputum, wheezing.  GI: See history of present illness. GU:  Negative for dysuria, hematuria, urinary incontinence, urinary frequency, nocturnal urination.  Endo: Negative for unusual weight change.    Physical Examination:   BP 114/73   Pulse (!) 123   Temp (!) 96.5 F (35.8 C) (Temporal)   Ht 5\' 5"  (1.651 m)   Wt 193 lb 9.6 oz (87.8 kg)   BMI 32.22 kg/m   General: Well-nourished, well-developed in no acute distress.  Eyes: No icterus. Conjunctivae pink. Extremities: No lower extremity edema. No clubbing or deformities. Neuro: Lethargic.  Grossly intact. Skin: Warm and dry, no jaundice.    Labs:    Imaging Studies: CT Angio Chest PE W and/or Wo Contrast  Result Date: 08/06/2019 CLINICAL DATA:  Shortness of breath for 1 day. EXAM: CT ANGIOGRAPHY CHEST WITH CONTRAST TECHNIQUE: Multidetector CT imaging of the chest was performed using the standard protocol during bolus administration of intravenous contrast. Multiplanar CT image reconstructions and MIPs were obtained to evaluate the vascular anatomy.  CONTRAST:  48mL OMNIPAQUE IOHEXOL 350 MG/ML SOLN COMPARISON:  Chest x-ray from earlier today FINDINGS: Cardiovascular: Enlarged heart. No pericardial effusion. Limited assessment of the pulmonary arteries due to motion primarily. No visible pulmonary embolism. Diffuse atherosclerotic calcification of the aorta and multifocal coronary atherosclerosis. Mediastinum/Nodes: Negative for adenopathy or mass. Lungs/Pleura: Left lower lobe consolidation. Milder airspace disease in the superior segment right lower lobe. No edema, effusion, or pneumothorax. Motion artifact. Tracheomalacia. Upper Abdomen: No acute finding Musculoskeletal: Spondylosis with multilevel thoracic ankylosis. Review of the MIP images confirms the above findings. IMPRESSION: 1. Left lower  lobe pneumonia. 2. Cardiomegaly without failure. 3. As permitted by moderate respiratory motion, no evidence of pulmonary embolism. Electronically Signed   By: Monte Fantasia M.D.   On: 08/06/2019 04:57   DG Chest Port 1 View  Result Date: 08/06/2019 CLINICAL DATA:  79 year old male with dyspnea. EXAM: PORTABLE CHEST 1 VIEW COMPARISON:  Chest radiograph dated 07/06/2019. FINDINGS: There is cardiomegaly with mild vascular congestion. No edema. No focal consolidation, pleural effusion, or pneumothorax. Atherosclerotic calcification of the aorta. No acute osseous pathology. Old healed right clavicular fracture. IMPRESSION: Cardiomegaly with mild vascular congestion. No focal consolidation. Electronically Signed   By: Anner Crete M.D.   On: 08/06/2019 00:50    Assessment and Plan:   ARPAN ESKELSON is a 79 y.o. y/o male who comes in today after being discharged in the hospital with symptomatic anemia.  The patient had an EGD and colonoscopy without any source of the bleeding seen.  The patient has been doing well and has a good hemoglobin.  Due to the patient's age and dementia I do not think pursuing any further tests at this time would be advisable.  The  patient's son and wife agree with not doing anything aggressive on him at the present time.  The patient should continue to bleed or his hemoglobin to drop then a capsule endoscopy can be considered.     Lucilla Lame, MD. Marval Regal    Note: This dictation was prepared with Dragon dictation along with smaller phrase technology. Any transcriptional errors that result from this process are unintentional.

## 2019-09-09 ENCOUNTER — Other Ambulatory Visit: Payer: Medicare Other

## 2019-09-14 ENCOUNTER — Other Ambulatory Visit: Payer: Self-pay | Admitting: Urology

## 2019-11-10 ENCOUNTER — Other Ambulatory Visit: Payer: Self-pay

## 2019-11-10 ENCOUNTER — Emergency Department: Payer: Medicare Other

## 2019-11-10 ENCOUNTER — Emergency Department
Admission: EM | Admit: 2019-11-10 | Discharge: 2019-11-10 | Disposition: A | Discharge status: Home / Self Care | Payer: Medicare Other | Attending: Emergency Medicine | Admitting: Emergency Medicine

## 2019-11-10 DIAGNOSIS — W19XXXA Unspecified fall, initial encounter: Secondary | ICD-10-CM

## 2019-11-10 DIAGNOSIS — Z87891 Personal history of nicotine dependence: Secondary | ICD-10-CM | POA: Insufficient documentation

## 2019-11-10 DIAGNOSIS — I2510 Atherosclerotic heart disease of native coronary artery without angina pectoris: Secondary | ICD-10-CM | POA: Diagnosis not present

## 2019-11-10 DIAGNOSIS — Z79890 Hormone replacement therapy (postmenopausal): Secondary | ICD-10-CM | POA: Insufficient documentation

## 2019-11-10 DIAGNOSIS — I5033 Acute on chronic diastolic (congestive) heart failure: Secondary | ICD-10-CM | POA: Insufficient documentation

## 2019-11-10 DIAGNOSIS — Z79899 Other long term (current) drug therapy: Secondary | ICD-10-CM | POA: Insufficient documentation

## 2019-11-10 DIAGNOSIS — E119 Type 2 diabetes mellitus without complications: Secondary | ICD-10-CM | POA: Diagnosis not present

## 2019-11-10 DIAGNOSIS — Z7901 Long term (current) use of anticoagulants: Secondary | ICD-10-CM | POA: Diagnosis not present

## 2019-11-10 DIAGNOSIS — Z794 Long term (current) use of insulin: Secondary | ICD-10-CM | POA: Insufficient documentation

## 2019-11-10 DIAGNOSIS — F0390 Unspecified dementia without behavioral disturbance: Secondary | ICD-10-CM | POA: Diagnosis not present

## 2019-11-10 DIAGNOSIS — I110 Hypertensive heart disease with heart failure: Secondary | ICD-10-CM | POA: Diagnosis not present

## 2019-11-10 DIAGNOSIS — J45909 Unspecified asthma, uncomplicated: Secondary | ICD-10-CM | POA: Diagnosis not present

## 2019-11-10 DIAGNOSIS — E039 Hypothyroidism, unspecified: Secondary | ICD-10-CM | POA: Diagnosis not present

## 2019-11-10 DIAGNOSIS — W07XXXA Fall from chair, initial encounter: Secondary | ICD-10-CM | POA: Insufficient documentation

## 2019-11-10 DIAGNOSIS — S0990XA Unspecified injury of head, initial encounter: Secondary | ICD-10-CM | POA: Insufficient documentation

## 2019-11-10 NOTE — Discharge Instructions (Addendum)
Follow-up with your regular doctor as needed.  Return emergency department if worsening.

## 2019-11-10 NOTE — ED Provider Notes (Signed)
[facility] Emergency Department Provider Note  ____________________________________________   First MD Initiated Contact with Patient 11/10/19 1922     (approximate)  I have reviewed the triage vital signs and the nursing notes.   HISTORY  Chief Complaint Fall    HPI Derek Shelton is a 79 y.o. male presents emergency department with his son.  Son states that he was trying to sit in a regular chair instead of his recliner and fell out of the chair.  He states his father has a history of sick sinus syndrome and severe dementia.  Is currently taking Eliquis so they are concerned of a head bleed.  They state other than that he does not have any other injuries.   Past Medical History:  Diagnosis Date  . Anemia   . Asthma   . CHF (congestive heart failure) ([place])   . COPD (chronic obstructive pulmonary disease) ([place])   . Coronary artery disease   . Dementia ([place])    Per son's report  . Diabetes mellitus without complication ([place])   . Dysrhythmia    Atrial Fibrillation  . GERD (gastroesophageal reflux disease)   . GI bleed   . Hyperlipidemia   . Hypertension   . Hypothyroidism   . Shortness of breath dyspnea   . Sleep apnea   . Stroke ([place])   . Thyroid disease     Patient Active Problem List   Diagnosis Date Noted  . Acute respiratory failure with hypoxia ([place]) 08/06/2019  . Acute on chronic diastolic CHF (congestive heart failure) ([place]) 08/06/2019  . History of GI bleed 08/06/2019  . History of bradycardia 08/06/2019  . Moderate tricuspid regurgitation 08/06/2019  . PAH (pulmonary artery hypertension) ([place]) 08/06/2019  . History of CVA (cerebrovascular accident) 08/06/2019  . LLL pneumonia 08/06/2019  . HCAP (healthcare-associated pneumonia) 08/06/2019  . Acute CHF (congestive heart failure) ([place]) 08/06/2019  . Dementia without behavioral disturbance ([place])   . Symptomatic anemia 07/06/2019  . Hypokalemia 04/02/2019  . Proteinuria  03/04/2019  . Retention of urine 03/04/2019  . Stage 3b chronic kidney disease ([place]) 03/04/2019  . Type II or unspecified type diabetes mellitus with renal manifestations, not stated as uncontrolled 03/04/2019  . Chronic kidney disease, stage 2 (mild) 01/04/2019  . 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 BPH (benign prostatic hyperplasia) 07/18/2018  . Degeneration of lumbar intervertebral disc 07/17/2018  . Osteoarthritis of knee 07/17/2018  . Spinal stenosis of lumbar region 07/17/2018  . Acute CVA (cerebrovascular accident) ([place]) 12/02/2017  . Acute ischemic stroke ([place]) 12/02/2017  . Acute metabolic encephalopathy due to hypoglycemia 11/29/2017  . Essential hypertension 11/29/2017  . Syncope and collapse 11/28/2017  . Atrial fibrillation, chronic ([place]) 05/22/2017  . Chest pain 02/21/2017  . AKI (acute kidney injury) ([place]) 02/21/2017  . SOB (shortness of breath) 02/08/2017  . Atrial fibrillation with RVR ([place]) 02/08/2017  . Hyperlipidemia 09/23/2014  . GI bleeding 08/06/2014  . Anemia 08/06/2014  . Bradycardia 08/06/2014  . Atrial fibrillation ([place]) 08/06/2014  . Hyponatremia 08/06/2014  . Chronic diastolic heart failure ([place]) 08/06/2014  . OSA (obstructive sleep apnea) 08/06/2014  . Memory loss or impairment 05/29/2014  . Current smoker 09/19/2013  . Asthma 11/29/2012  . Dizziness 11/15/2012  . Other dyspnea and respiratory abnormality 11/15/2012  . Pain in joint involving lower leg 01/05/2011  . Type 2 diabetes mellitus without complication ([place]) 08/67/6195  . 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 Chronic rhinitis 08/11/2010  . Coronary artery  disease 08/11/2010  . Esophageal reflux 08/11/2010  . Personal history of arthritis 08/11/2010  . Tear film insufficiency 06/07/2010  . 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 Keratoconjunctivitis sicca ([place]) 03/10/2010  . Lens replaced 03/10/2010  . Pterygium 03/10/2010  . Gastro-esophageal reflux  disease with esophagitis 01/30/2006    Past Surgical History:  Procedure Laterality Date  . CARDIAC CATHETERIZATION    . COLONOSCOPY N/A 08/10/2014   Procedure: COLONOSCOPY;  Surgeon: Manya Silvas, MD;  Location[facility] ENDOSCOPY;  Service: Endoscopy;  Laterality: N/A;  . COLONOSCOPY WITH PROPOFOL N/A 04/05/2016   Procedure: COLONOSCOPY WITH PROPOFOL;  Surgeon: Lollie Sails, MD;  Location[facility] ENDOSCOPY;  Service: Endoscopy;  Laterality: N/A;  . COLONOSCOPY WITH PROPOFOL N/A 07/10/2019   Procedure: COLONOSCOPY WITH PROPOFOL;  Surgeon: Lucilla Lame, MD;  Location[facility] ENDOSCOPY;  Service: Endoscopy;  Laterality: N/A;  . ESOPHAGOGASTRODUODENOSCOPY N/A 08/08/2014   Procedure: ESOPHAGOGASTRODUODENOSCOPY (EGD);  Surgeon: Manya Silvas, MD;  Location: [facility] ENDOSCOPY;  Service: Endoscopy;  Laterality: N/A;  plan for early afternoon  . ESOPHAGOGASTRODUODENOSCOPY (EGD) WITH PROPOFOL N/A 07/10/2019   Procedure: ESOPHAGOGASTRODUODENOSCOPY (EGD) WITH PROPOFOL;  Surgeon: Lucilla Lame, MD;  Location[facility]COPY;  Service: Endoscopy;  Laterality: N/A;  . EYE SURGERY    . GIVENS CAPSULE STUDY N/A 08/12/2014   Procedure: GIVENS CAPSULE STUDY;  Surgeon: Manya Silvas, MD;  Location: [facility] ENDOSCOPY;  Service: Endoscopy;  Laterality: N/A;  . rectal fistula N/A     Prior to Admission medications   Medication Sig Start Date End Date Taking? Authorizing Provider  ACCU-CHEK GUIDE test strip E11.9   TO CHECK BLOOD GLUCOSE TWICE A DAY 08/06/19   [provider]  apixaban (ELIQUIS) 2.5 MG TABS tablet Take 1 tablet (2.5 mg total) by mouth 2 (two) times daily. 07/10/19   Lorella Nimrod, MD  atorvastatin (LIPITOR) 40 MG tablet Take 1 tablet (40 mg total) by mouth daily at 6 PM. 12/03/17   Demetrios Loll, MD  BD PEN NEEDLE NANO 2ND GEN 32G X 4 MM MISC USE NIGHTLY WITH TRESIBA 08/28/19   [provider]  budesonide-formoterol (SYMBICORT) 160-4.5 MCG/ACT inhaler Inhale 2 puffs into the lungs 2 (two)  times daily.    [provider]  digoxin (LANOXIN) 0.125 MG tablet Take 1 tablet (0.125 mg total) by mouth daily. Patient taking differently: Take 0.125 mg by mouth. TAKE ONCE A DAY EVERY OTHER DAY. 12/08/17   Hillary Bow, MD  donepezil (ARICEPT) 5 MG tablet Take 5 mg by mouth at bedtime.    [provider]  DULoxetine (CYMBALTA) 60 MG capsule Take 60 mg by mouth daily.    [provider]  ergocalciferol (VITAMIN D2) 1.25 MG (50000 UT) capsule Take 50,000 Units by mouth once a week.    [provider]  ferrous sulfate 325 (65 FE) MG EC tablet Take 1 tablet (325 mg total) by mouth 2 (two) times daily. Patient taking differently: Take 325 mg by mouth daily.  07/11/19 07/10/20  Lorella Nimrod, MD  finasteride (PROSCAR) 5 MG tablet Take 1 tablet (5 mg total) by mouth daily. 07/18/18   Stoioff, Ronda Fairly, MD  furosemide (LASIX) 20 MG tablet Take 40 mg by mouth daily.     [provider]  insulin degludec (TRESIBA FLEXTOUCH) 100 UNIT/ML SOPN FlexTouch Pen Inject 30 Units into the skin at bedtime.     [provider]  ipratropium-albuterol (DUONEB) 0.5-2.5 (3) MG/3ML SOLN Inhale 3 mLs into the lungs every 6 (six)  hours as needed for shortness of breath. 01/22/17   [provider]  latanoprost (XALATAN) 0.005 % ophthalmic solution latanoprost 0.005 % eye drops  INSTILL ONE DROP TO BOTH EYES AT BEDTIME.    [provider]  levothyroxine (SYNTHROID, LEVOTHROID) 112 MCG tablet Take 112 mcg by mouth daily before breakfast.     [provider]  Melatonin 3 MG TABS Take 3 mg by mouth at bedtime as needed (sleep).     [provider]  metoprolol tartrate (LOPRESSOR) 25 MG tablet Take 25 mg by mouth 2 (two) times daily.     [provider]  montelukast (SINGULAIR) 10 MG tablet Take 10 mg by mouth daily.     [provider]  omega-3 acid ethyl esters (LOVAZA) 1 G capsule Take 1 g by mouth daily.     [provider]  pantoprazole (PROTONIX) 40 MG tablet Take 40 mg by mouth daily. 06/04/19   [provider]  potassium chloride SA (K-DUR,KLOR-CON) 20 MEQ tablet Take 10 mEq by mouth 3 (three) times a week. Take 1/2 tablets (10MEQ) by mouth twice daily for 3 days as directed Mon., Wed., Fri. 12/01/17   [provider]  sildenafil (REVATIO) 20 MG tablet Take 20 mg by mouth 3 (three) times daily. 05/09/19   [provider]  tamsulosin (FLOMAX) 0.4 MG CAPS capsule Take 0.4 mg by mouth daily. 05/07/19   [provider]  tiotropium (SPIRIVA) 18 MCG inhalation capsule Place 18 mcg into inhaler and inhale daily.    [provider]    Allergies Carvedilol, Penicillins, Aspirin, Aricept [donepezil hcl], and Catapres [clonidine hcl]  Family History  Problem Relation Age of Onset  . Diabetes Mother     Social History Social History   Tobacco Use  . Smoking status: Former Smoker    Quit date: 2000    Years since quitting: 21.8  . Smokeless tobacco: Former Systems developer    Types: Secondary school teacher  . Vaping Use: Never used  Substance Use Topics  . Alcohol use: No  . Drug use: No    Review of Systems  Constitutional: No fever/chills Eyes: No visual changes. ENT: No sore throat. Respiratory: Denies cough Cardiovascular: Denies chest pain Genitourinary: Negative for dysuria. Musculoskeletal: Negative for back pain. Skin: Negative for rash. Psychiatric: no mood changes,     ____________________________________________   PHYSICAL EXAM:  VITAL SIGNS: ED Triage Vitals  Enc Vitals Group     BP 11/10/19 1745 117/80     Pulse Rate 11/10/19 1742 76     Resp 11/10/19 1742 18     Temp 11/10/19 1742 98 F (36.7 C)     Temp Source 11/10/19 1742 Oral     SpO2 11/10/19 1742 96 %     Weight 11/10/19 1742 194 lb 0.1 oz (88 kg)     Height 11/10/19 1742 5\' 5"  (1.651 m)     Head Circumference --      Peak Flow --      Pain Score 11/10/19 1742 8     Pain  Loc --      Pain Edu? --      Excl. in [place]? --     Constitutional: Alert and oriented. Well appearing and in no acute distress.  Exam is limited due to his dementia Eyes: Conjunctivae are normal.  Head: Atraumatic.   Nose: No congestion/rhinnorhea. Mouth/Throat: Mucous membranes are moist.   Neck:  supple no lymphadenopathy noted Cardiovascular: Normal rate,  regular rhythm. Heart sounds are normal Respiratory: Normal respiratory effort.  No retractions, lungs c t a  GU: deferred Musculoskeletal: FROM all extremities, warm and well perfused, patient sitting in wheelchair, C-spine mildly tender Neurologic:  Normal speech and language, patient at baseline per family.  Skin:  Skin is warm, dry and intact. No rash noted. Psychiatric: Mood and affect are normal. Speech and behavior are normal.  ____________________________________________   LABS (all labs ordered are listed, but only abnormal results are displayed)  Labs Reviewed - No data to display ____________________________________________   ____________________________________________  RADIOLOGY  CT of the head and C-spine are both negative for any acute abnormalities  ____________________________________________   PROCEDURES  Procedure(s) performed: No  Procedures    ____________________________________________   INITIAL IMPRESSION / ASSESSMENT AND PLAN / ED COURSE  Pertinent labs & imaging results that were available during my care of the patient were reviewed by me and considered in my medical decision making (see chart for details).   Patient is an 79 year old male with history of dementia and sick sinus syndrome that presents emergency department after a fall.  See HPI.  Physical exam shows patient to appear stable.  CTs were performed from triage and are both negative.  I do feel the patient is stable to return home.  Long discussion with his son on whether there are any other injuries.  They say no.  Talked  to both sons 1 via phone.  They state all they wanted was a CT scan to make sure he did not have a head bleed due to his recent fall and use of Eliquis.  Patient was discharged in the care of his son.  He is to return if worsening.     Derek Shelton was evaluated in Emergency Department on 11/10/2019 for the symptoms described in the history of present illness. He was evaluated in the context of the global COVID-19 pandemic, which necessitated consideration that the patient might be at risk for infection with the SARS-CoV-2 virus that causes COVID-19. Institutional protocols and algorithms that pertain to the evaluation of patients at risk for COVID-19 are in a state of rapid change based on information released by regulatory bodies including the CDC and federal and state organizations. These policies and algorithms were followed during the patient's care in the ED.    As part of my medical decision making, I reviewed the following data within the [place] History obtained from family, Nursing notes reviewed and incorporated, Old chart reviewed, Radiograph reviewed , Notes from prior ED visits and [place] Controlled Substance Database  ____________________________________________   FINAL CLINICAL IMPRESSION(S) / ED DIAGNOSES  Final diagnoses:  Fall, initial encounter  Minor head injury, initial encounter      NEW MEDICATIONS STARTED DURING THIS VISIT:  New Prescriptions   No medications on file     Note:  This document was prepared using Dragon voice recognition software and may include unintentional dictation errors.    Versie Starks, PA-C 11/10/19 1941    Arta Silence, MD 11/10/19 2352

## 2019-11-10 NOTE — ED Triage Notes (Signed)
Pt comes pov from home after dozing off and falling out of his chair. On eliquis. Hit head. C/o neck pain. Pt AO at baseline due to dementia. Son with pt.

## 2019-11-10 NOTE — ED Notes (Signed)
Son with pt. As per Son, pt was in a chair trying to move and fell. Pt was complaining of right hand pain and neck pain.  Provider at bedside.

## 2020-01-14 ENCOUNTER — Inpatient Hospital Stay
Admission: EM | Admit: 2020-01-14 | Discharge: 2020-01-21 | DRG: 177 | Disposition: A | Discharge status: Home Health | Payer: Medicare Other | Attending: Internal Medicine | Admitting: Internal Medicine

## 2020-01-14 ENCOUNTER — Emergency Department: Payer: Medicare Other

## 2020-01-14 ENCOUNTER — Other Ambulatory Visit: Payer: Self-pay

## 2020-01-14 DIAGNOSIS — Z23 Encounter for immunization: Secondary | ICD-10-CM

## 2020-01-14 DIAGNOSIS — I5032 Chronic diastolic (congestive) heart failure: Secondary | ICD-10-CM | POA: Diagnosis present

## 2020-01-14 DIAGNOSIS — I2510 Atherosclerotic heart disease of native coronary artery without angina pectoris: Secondary | ICD-10-CM | POA: Diagnosis present

## 2020-01-14 DIAGNOSIS — U071 COVID-19: Principal | ICD-10-CM

## 2020-01-14 DIAGNOSIS — Z833 Family history of diabetes mellitus: Secondary | ICD-10-CM

## 2020-01-14 DIAGNOSIS — Z880 Allergy status to penicillin: Secondary | ICD-10-CM

## 2020-01-14 DIAGNOSIS — T380X5A Adverse effect of glucocorticoids and synthetic analogues, initial encounter: Secondary | ICD-10-CM | POA: Diagnosis not present

## 2020-01-14 DIAGNOSIS — Z888 Allergy status to other drugs, medicaments and biological substances status: Secondary | ICD-10-CM

## 2020-01-14 DIAGNOSIS — J440 Chronic obstructive pulmonary disease with acute lower respiratory infection: Secondary | ICD-10-CM | POA: Diagnosis not present

## 2020-01-14 DIAGNOSIS — I2721 Secondary pulmonary arterial hypertension: Secondary | ICD-10-CM | POA: Diagnosis present

## 2020-01-14 DIAGNOSIS — I10 Essential (primary) hypertension: Secondary | ICD-10-CM | POA: Diagnosis present

## 2020-01-14 DIAGNOSIS — N1831 Chronic kidney disease, stage 3a: Secondary | ICD-10-CM | POA: Diagnosis present

## 2020-01-14 DIAGNOSIS — D631 Anemia in chronic kidney disease: Secondary | ICD-10-CM | POA: Diagnosis present

## 2020-01-14 DIAGNOSIS — J441 Chronic obstructive pulmonary disease with (acute) exacerbation: Secondary | ICD-10-CM | POA: Diagnosis not present

## 2020-01-14 DIAGNOSIS — K5900 Constipation, unspecified: Secondary | ICD-10-CM | POA: Diagnosis not present

## 2020-01-14 DIAGNOSIS — E1129 Type 2 diabetes mellitus with other diabetic kidney complication: Secondary | ICD-10-CM | POA: Diagnosis present

## 2020-01-14 DIAGNOSIS — D649 Anemia, unspecified: Secondary | ICD-10-CM | POA: Diagnosis present

## 2020-01-14 DIAGNOSIS — Z7951 Long term (current) use of inhaled steroids: Secondary | ICD-10-CM

## 2020-01-14 DIAGNOSIS — R195 Other fecal abnormalities: Secondary | ICD-10-CM | POA: Diagnosis not present

## 2020-01-14 DIAGNOSIS — K219 Gastro-esophageal reflux disease without esophagitis: Secondary | ICD-10-CM | POA: Diagnosis present

## 2020-01-14 DIAGNOSIS — Z7901 Long term (current) use of anticoagulants: Secondary | ICD-10-CM

## 2020-01-14 DIAGNOSIS — Z8673 Personal history of transient ischemic attack (TIA), and cerebral infarction without residual deficits: Secondary | ICD-10-CM | POA: Diagnosis not present

## 2020-01-14 DIAGNOSIS — I130 Hypertensive heart and chronic kidney disease with heart failure and stage 1 through stage 4 chronic kidney disease, or unspecified chronic kidney disease: Secondary | ICD-10-CM | POA: Diagnosis present

## 2020-01-14 DIAGNOSIS — E1122 Type 2 diabetes mellitus with diabetic chronic kidney disease: Secondary | ICD-10-CM | POA: Diagnosis present

## 2020-01-14 DIAGNOSIS — J1282 Pneumonia due to coronavirus disease 2019: Secondary | ICD-10-CM | POA: Diagnosis present

## 2020-01-14 DIAGNOSIS — Z9119 Patient's noncompliance with other medical treatment and regimen: Secondary | ICD-10-CM

## 2020-01-14 DIAGNOSIS — E785 Hyperlipidemia, unspecified: Secondary | ICD-10-CM | POA: Diagnosis present

## 2020-01-14 DIAGNOSIS — J449 Chronic obstructive pulmonary disease, unspecified: Secondary | ICD-10-CM | POA: Diagnosis not present

## 2020-01-14 DIAGNOSIS — E669 Obesity, unspecified: Secondary | ICD-10-CM | POA: Diagnosis present

## 2020-01-14 DIAGNOSIS — I4891 Unspecified atrial fibrillation: Secondary | ICD-10-CM | POA: Diagnosis present

## 2020-01-14 DIAGNOSIS — G4733 Obstructive sleep apnea (adult) (pediatric): Secondary | ICD-10-CM | POA: Diagnosis present

## 2020-01-14 DIAGNOSIS — E039 Hypothyroidism, unspecified: Secondary | ICD-10-CM | POA: Diagnosis present

## 2020-01-14 DIAGNOSIS — Z794 Long term (current) use of insulin: Secondary | ICD-10-CM

## 2020-01-14 DIAGNOSIS — I480 Paroxysmal atrial fibrillation: Secondary | ICD-10-CM | POA: Diagnosis present

## 2020-01-14 DIAGNOSIS — Z886 Allergy status to analgesic agent status: Secondary | ICD-10-CM

## 2020-01-14 DIAGNOSIS — D509 Iron deficiency anemia, unspecified: Secondary | ICD-10-CM | POA: Diagnosis present

## 2020-01-14 DIAGNOSIS — J069 Acute upper respiratory infection, unspecified: Secondary | ICD-10-CM

## 2020-01-14 DIAGNOSIS — Z79899 Other long term (current) drug therapy: Secondary | ICD-10-CM

## 2020-01-14 DIAGNOSIS — Z8719 Personal history of other diseases of the digestive system: Secondary | ICD-10-CM

## 2020-01-14 DIAGNOSIS — Z79890 Hormone replacement therapy (postmenopausal): Secondary | ICD-10-CM

## 2020-01-14 DIAGNOSIS — N179 Acute kidney failure, unspecified: Secondary | ICD-10-CM | POA: Diagnosis not present

## 2020-01-14 DIAGNOSIS — N400 Enlarged prostate without lower urinary tract symptoms: Secondary | ICD-10-CM | POA: Diagnosis present

## 2020-01-14 DIAGNOSIS — Z87891 Personal history of nicotine dependence: Secondary | ICD-10-CM

## 2020-01-14 DIAGNOSIS — J988 Other specified respiratory disorders: Secondary | ICD-10-CM | POA: Diagnosis present

## 2020-01-14 DIAGNOSIS — E1165 Type 2 diabetes mellitus with hyperglycemia: Secondary | ICD-10-CM | POA: Diagnosis not present

## 2020-01-14 DIAGNOSIS — J189 Pneumonia, unspecified organism: Secondary | ICD-10-CM

## 2020-01-14 DIAGNOSIS — F0390 Unspecified dementia without behavioral disturbance: Secondary | ICD-10-CM | POA: Diagnosis present

## 2020-01-14 DIAGNOSIS — I509 Heart failure, unspecified: Secondary | ICD-10-CM

## 2020-01-14 DIAGNOSIS — Z6834 Body mass index (BMI) 34.0-34.9, adult: Secondary | ICD-10-CM | POA: Diagnosis not present

## 2020-01-14 DIAGNOSIS — R5381 Other malaise: Secondary | ICD-10-CM

## 2020-01-14 DIAGNOSIS — R778 Other specified abnormalities of plasma proteins: Secondary | ICD-10-CM | POA: Diagnosis present

## 2020-01-14 DIAGNOSIS — E119 Type 2 diabetes mellitus without complications: Secondary | ICD-10-CM

## 2020-01-14 DIAGNOSIS — T454X5A Adverse effect of iron and its compounds, initial encounter: Secondary | ICD-10-CM | POA: Diagnosis present

## 2020-01-14 LAB — CBC WITH DIFFERENTIAL/PLATELET
Abs Immature Granulocytes: 0.03 10*3/uL (ref 0.00–0.07)
Basophils Absolute: 0 10*3/uL (ref 0.0–0.1)
Basophils Relative: 0 %
Eosinophils Absolute: 0.1 10*3/uL (ref 0.0–0.5)
Eosinophils Relative: 1 %
HCT: 31.9 % — ABNORMAL LOW (ref 39.0–52.0)
Hemoglobin: 9.8 g/dL — ABNORMAL LOW (ref 13.0–17.0)
Immature Granulocytes: 0 %
Lymphocytes Relative: 6 %
Lymphs Abs: 0.4 10*3/uL — ABNORMAL LOW (ref 0.7–4.0)
MCH: 27.8 pg (ref 26.0–34.0)
MCHC: 30.7 g/dL (ref 30.0–36.0)
MCV: 90.4 fL (ref 80.0–100.0)
Monocytes Absolute: 0.8 10*3/uL (ref 0.1–1.0)
Monocytes Relative: 12 %
Neutro Abs: 5.6 10*3/uL (ref 1.7–7.7)
Neutrophils Relative %: 81 %
Platelets: 157 10*3/uL (ref 150–400)
RBC: 3.53 MIL/uL — ABNORMAL LOW (ref 4.22–5.81)
RDW: 15.5 % (ref 11.5–15.5)
WBC: 6.9 10*3/uL (ref 4.0–10.5)
nRBC: 0 % (ref 0.0–0.2)

## 2020-01-14 LAB — COMPREHENSIVE METABOLIC PANEL
ALT: 18 U/L (ref 0–44)
AST: 30 U/L (ref 15–41)
Albumin: 3.9 g/dL (ref 3.5–5.0)
Alkaline Phosphatase: 95 U/L (ref 38–126)
Anion gap: 11 (ref 5–15)
BUN: 38 mg/dL — ABNORMAL HIGH (ref 8–23)
CO2: 29 mmol/L (ref 22–32)
Calcium: 8.9 mg/dL (ref 8.9–10.3)
Chloride: 98 mmol/L (ref 98–111)
Creatinine, Ser: 1.33 mg/dL — ABNORMAL HIGH (ref 0.61–1.24)
GFR, Estimated: 54 mL/min — ABNORMAL LOW (ref >60)
Glucose, Bld: 108 mg/dL — ABNORMAL HIGH (ref 70–99)
Potassium: 3.6 mmol/L (ref 3.5–5.1)
Sodium: 138 mmol/L (ref 135–145)
Total Bilirubin: 0.9 mg/dL (ref 0.3–1.2)
Total Protein: 8.3 g/dL — ABNORMAL HIGH (ref 6.5–8.1)

## 2020-01-14 LAB — LACTATE DEHYDROGENASE: LDH: 203 U/L — ABNORMAL HIGH (ref 98–192)

## 2020-01-14 LAB — PROCALCITONIN: Procalcitonin: 0.41 ng/mL

## 2020-01-14 LAB — FIBRIN DERIVATIVES D-DIMER (ARMC ONLY): Fibrin derivatives D-dimer (ARMC): 721.09 ng/mL (FEU) — ABNORMAL HIGH (ref 0.00–499.00)

## 2020-01-14 LAB — BRAIN NATRIURETIC PEPTIDE: B Natriuretic Peptide: 301.2 pg/mL — ABNORMAL HIGH (ref 0.0–100.0)

## 2020-01-14 LAB — DIGOXIN LEVEL: Digoxin Level: <0.2 ng/mL — ABNORMAL LOW (ref 0.8–2.0)

## 2020-01-14 LAB — FIBRINOGEN: Fibrinogen: 374 mg/dL (ref 210–475)

## 2020-01-14 LAB — RESP PANEL BY RT-PCR (FLU A&B, COVID) ARPGX2
Influenza A by PCR: NEGATIVE
Influenza B by PCR: NEGATIVE
SARS Coronavirus 2 by RT PCR: POSITIVE — AB

## 2020-01-14 LAB — TROPONIN I (HIGH SENSITIVITY)
Troponin I (High Sensitivity): 24 ng/L — ABNORMAL HIGH (ref <18)
Troponin I (High Sensitivity): 28 ng/L — ABNORMAL HIGH (ref <18)

## 2020-01-14 LAB — CBG MONITORING, ED: Glucose-Capillary: 119 mg/dL — ABNORMAL HIGH (ref 70–99)

## 2020-01-14 LAB — TRIGLYCERIDES: Triglycerides: 32 mg/dL (ref <150)

## 2020-01-14 LAB — FERRITIN: Ferritin: 21 ng/mL — ABNORMAL LOW (ref 24–336)

## 2020-01-14 MED ORDER — ONDANSETRON HCL 4 MG PO TABS: 4.0000 mg | ORAL_TABLET | Freq: Four times a day (QID) | ORAL | Status: DC | PRN

## 2020-01-14 MED ORDER — PANTOPRAZOLE SODIUM 40 MG PO TBEC
40.0000 mg | DELAYED_RELEASE_TABLET | Freq: Every day | ORAL | Status: DC
  Administered 2020-01-15 – 2020-01-21 (×7): 40 mg via ORAL
  Filled 2020-01-14 (×7): qty 1

## 2020-01-14 MED ORDER — POTASSIUM CHLORIDE ER 20 MEQ PO TBCR: 1.0000 | EXTENDED_RELEASE_TABLET | Freq: Every day | ORAL | Status: DC

## 2020-01-14 MED ORDER — SODIUM CHLORIDE 0.9 % IV SOLN
200.0000 mg | Freq: Once | INTRAVENOUS | Status: AC
  Administered 2020-01-15: 01:00:00 200 mg via INTRAVENOUS
  Filled 2020-01-14: qty 200

## 2020-01-14 MED ORDER — TIOTROPIUM BROMIDE MONOHYDRATE 18 MCG IN CAPS
18.0000 ug | ORAL_CAPSULE | Freq: Every day | RESPIRATORY_TRACT | Status: DC
  Administered 2020-01-15 – 2020-01-20 (×6): 18 ug via RESPIRATORY_TRACT
  Filled 2020-01-14 (×2): qty 5

## 2020-01-14 MED ORDER — ONDANSETRON HCL 4 MG/2ML IJ SOLN: 4.0000 mg | Freq: Four times a day (QID) | INTRAMUSCULAR | Status: DC | PRN

## 2020-01-14 MED ORDER — INSULIN ASPART 100 UNIT/ML ~~LOC~~ SOLN
0.0000 [IU] | Freq: Every day | SUBCUTANEOUS | Status: DC
  Administered 2020-01-18: 02:00:00 3 [IU] via SUBCUTANEOUS
  Administered 2020-01-18 – 2020-01-19 (×2): 2 [IU] via SUBCUTANEOUS
  Filled 2020-01-14 (×2): qty 1

## 2020-01-14 MED ORDER — INSULIN ASPART 100 UNIT/ML ~~LOC~~ SOLN
0.0000 [IU] | Freq: Three times a day (TID) | SUBCUTANEOUS | Status: DC
  Administered 2020-01-15: 13:00:00 3 [IU] via SUBCUTANEOUS
  Administered 2020-01-15: 17:00:00 6 [IU] via SUBCUTANEOUS
  Administered 2020-01-16: 13:00:00 3 [IU] via SUBCUTANEOUS
  Administered 2020-01-16: 17:00:00 2 [IU] via SUBCUTANEOUS
  Administered 2020-01-16: 09:00:00 3 [IU] via SUBCUTANEOUS
  Administered 2020-01-17: 13:00:00 1 [IU] via SUBCUTANEOUS
  Administered 2020-01-17: 17:00:00 3 [IU] via SUBCUTANEOUS
  Administered 2020-01-18: 09:00:00 2 [IU] via SUBCUTANEOUS
  Administered 2020-01-18: 16:00:00 3 [IU] via SUBCUTANEOUS
  Administered 2020-01-18: 13:00:00 1 [IU] via SUBCUTANEOUS
  Administered 2020-01-19: 18:00:00 3 [IU] via SUBCUTANEOUS
  Administered 2020-01-19: 10:00:00 2 [IU] via SUBCUTANEOUS
  Administered 2020-01-20: 08:00:00 1 [IU] via SUBCUTANEOUS
  Administered 2020-01-20: 17:00:00 3 [IU] via SUBCUTANEOUS
  Administered 2020-01-20: 12:00:00 1 [IU] via SUBCUTANEOUS
  Filled 2020-01-14 (×16): qty 1

## 2020-01-14 MED ORDER — ACETAMINOPHEN 325 MG PO TABS
650.0000 mg | ORAL_TABLET | Freq: Four times a day (QID) | ORAL | Status: DC | PRN
  Administered 2020-01-14 – 2020-01-16 (×3): 650 mg via ORAL
  Filled 2020-01-14 (×3): qty 2

## 2020-01-14 MED ORDER — METOPROLOL TARTRATE 5 MG/5ML IV SOLN
2.5000 mg | INTRAVENOUS | Status: DC | PRN
  Administered 2020-01-15: 17:00:00 2.5 mg via INTRAVENOUS
  Filled 2020-01-14: qty 5

## 2020-01-14 MED ORDER — METHYLPREDNISOLONE SODIUM SUCC 125 MG IJ SOLR
90.0000 mg | Freq: Two times a day (BID) | INTRAMUSCULAR | Status: DC
  Administered 2020-01-15 (×2): 90 mg via INTRAVENOUS
  Filled 2020-01-14 (×2): qty 2

## 2020-01-14 MED ORDER — METOPROLOL TARTRATE 25 MG PO TABS
25.0000 mg | ORAL_TABLET | Freq: Two times a day (BID) | ORAL | Status: DC
  Administered 2020-01-15 – 2020-01-18 (×6): 25 mg via ORAL
  Filled 2020-01-14 (×6): qty 1

## 2020-01-14 MED ORDER — MOMETASONE FURO-FORMOTEROL FUM 200-5 MCG/ACT IN AERO
2.0000 | INHALATION_SPRAY | Freq: Two times a day (BID) | RESPIRATORY_TRACT | Status: DC
  Administered 2020-01-15 – 2020-01-21 (×13): 2 via RESPIRATORY_TRACT
  Filled 2020-01-14: qty 8.8

## 2020-01-14 MED ORDER — HYDROCOD POLST-CPM POLST ER 10-8 MG/5ML PO SUER
5.0000 mL | Freq: Two times a day (BID) | ORAL | Status: DC | PRN
  Administered 2020-01-18: 5 mL via ORAL
  Filled 2020-01-14: qty 5

## 2020-01-14 MED ORDER — SODIUM CHLORIDE 0.9 % IV SOLN
500.0000 mg | INTRAVENOUS | Status: DC
  Administered 2020-01-14: 500 mg via INTRAVENOUS
  Filled 2020-01-14 (×2): qty 500

## 2020-01-14 MED ORDER — SODIUM CHLORIDE 0.9 % IV SOLN
250.0000 mL | INTRAVENOUS | Status: DC | PRN
  Administered 2020-01-15: 10:00:00 250 mL via INTRAVENOUS

## 2020-01-14 MED ORDER — SODIUM CHLORIDE 0.9 % IV SOLN
100.0000 mg | Freq: Every day | INTRAVENOUS | Status: DC
  Administered 2020-01-15: 10:00:00 100 mg via INTRAVENOUS
  Filled 2020-01-14: qty 20
  Filled 2020-01-14: qty 100

## 2020-01-14 MED ORDER — MONTELUKAST SODIUM 10 MG PO TABS
10.0000 mg | ORAL_TABLET | Freq: Every day | ORAL | Status: DC
  Administered 2020-01-15 – 2020-01-20 (×6): 10 mg via ORAL
  Filled 2020-01-14 (×7): qty 1

## 2020-01-14 MED ORDER — SODIUM CHLORIDE 0.9 % IV SOLN
2.0000 g | INTRAVENOUS | Status: DC
  Administered 2020-01-14: 2 g via INTRAVENOUS
  Filled 2020-01-14 (×2): qty 20

## 2020-01-14 MED ORDER — APIXABAN 5 MG PO TABS
5.0000 mg | ORAL_TABLET | Freq: Two times a day (BID) | ORAL | Status: DC
  Administered 2020-01-15 (×2): 5 mg via ORAL
  Filled 2020-01-14 (×2): qty 1

## 2020-01-14 MED ORDER — METHYLPREDNISOLONE SODIUM SUCC 125 MG IJ SOLR
125.0000 mg | INTRAMUSCULAR | Status: AC
  Administered 2020-01-14: 125 mg via INTRAVENOUS
  Filled 2020-01-14: qty 2

## 2020-01-14 MED ORDER — LINAGLIPTIN 5 MG PO TABS
5.0000 mg | ORAL_TABLET | Freq: Every day | ORAL | Status: DC
  Administered 2020-01-15 – 2020-01-21 (×7): 5 mg via ORAL
  Filled 2020-01-14 (×8): qty 1

## 2020-01-14 MED ORDER — IPRATROPIUM-ALBUTEROL 20-100 MCG/ACT IN AERS
1.0000 | INHALATION_SPRAY | Freq: Four times a day (QID) | RESPIRATORY_TRACT | Status: DC
  Administered 2020-01-15 – 2020-01-21 (×24): 1 via RESPIRATORY_TRACT
  Filled 2020-01-14: qty 4

## 2020-01-14 MED ORDER — PREDNISONE 50 MG PO TABS: 50.0000 mg | ORAL_TABLET | Freq: Every day | ORAL | Status: DC

## 2020-01-14 MED ORDER — GUAIFENESIN-DM 100-10 MG/5ML PO SYRP
10.0000 mL | ORAL_SOLUTION | ORAL | Status: DC | PRN
  Administered 2020-01-14 – 2020-01-19 (×2): 10 mL via ORAL
  Filled 2020-01-14 (×3): qty 10

## 2020-01-14 NOTE — ED Notes (Signed)
Date and time results received: 01/14/20 1933   Test: COVID  Critical Value: POSITIVE  Name of Provider Notified: Joni Fears, MD   Orders Received? Or Actions Taken?: MD Aware

## 2020-01-14 NOTE — ED Provider Notes (Signed)
[facility] Emergency Department Provider Note  ____________________________________________  Time seen: Approximately 8:41 PM  I have reviewed the triage vital signs and the nursing notes.   HISTORY  Chief Complaint Shortness of Breath    HPI Derek Shelton is a 79 y.o. male with a history of CHF COPD diabetes hypertension GERD dementia who is brought to the ED by his son due to productive cough, fever, shortness of breath for the past 3 days, worsening, constant.  No aggravating alleviating factors.  He normally walks short distances within the home, but today he was too weak to walk even a step or 2 from bed to wheelchair.  No falls.  No vomiting or diarrhea.  He has had decreased p.o. intake.  He had to [employer] vaccines, no booster yet.  Son reports that on pulse oximeter at home, room air oxygen saturation was 85% when trying to walk.   Past Medical History:  Diagnosis Date  . Anemia   . Asthma   . CHF (congestive heart failure) ([place])   . COPD (chronic obstructive pulmonary disease) ([place])   . Coronary artery disease   . Dementia ([place])    Per son's report  . Diabetes mellitus without complication ([place])   . Dysrhythmia    Atrial Fibrillation  . GERD (gastroesophageal reflux disease)   . GI bleed   . Hyperlipidemia   . Hypertension   . Hypothyroidism   . Shortness of breath dyspnea   . Sleep apnea   . Stroke ([place])   . Thyroid disease      Patient Active Problem List   Diagnosis Date Noted  . Acute respiratory failure with hypoxia ([place]) 08/06/2019  . Acute on chronic diastolic CHF (congestive heart failure) ([place]) 08/06/2019  . History of GI bleed 08/06/2019  . History of bradycardia 08/06/2019  . Moderate tricuspid regurgitation 08/06/2019  . PAH (pulmonary artery hypertension) ([place]) 08/06/2019  . History of CVA (cerebrovascular accident) 08/06/2019  . LLL pneumonia 08/06/2019  . HCAP (healthcare-associated pneumonia) 08/06/2019  .  Acute CHF (congestive heart failure) ([place]) 08/06/2019  . Dementia without behavioral disturbance ([place])   . Symptomatic anemia 07/06/2019  . Hypokalemia 04/02/2019  . Proteinuria 03/04/2019  . Retention of urine 03/04/2019  . Stage 3b chronic kidney disease ([place]) 03/04/2019  . Type II or unspecified type diabetes mellitus with renal manifestations, not stated as uncontrolled 03/04/2019  . Chronic kidney disease, stage 2 (mild) 01/04/2019  . 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 BPH (benign prostatic hyperplasia) 07/18/2018  . Degeneration of lumbar intervertebral disc 07/17/2018  . Osteoarthritis of knee 07/17/2018  . Spinal stenosis of lumbar region 07/17/2018  . Acute CVA (cerebrovascular accident) ([place]) 12/02/2017  . Acute ischemic stroke ([place]) 12/02/2017  . Acute metabolic encephalopathy due to hypoglycemia 11/29/2017  . Essential hypertension 11/29/2017  . Syncope and collapse 11/28/2017  . Atrial fibrillation, chronic ([place]) 05/22/2017  . Chest pain 02/21/2017  . AKI (acute kidney injury) ([place]) 02/21/2017  . SOB (shortness of breath) 02/08/2017  . Atrial fibrillation with RVR ([place]) 02/08/2017  . Hyperlipidemia 09/23/2014  . GI bleeding 08/06/2014  . Anemia 08/06/2014  . Bradycardia 08/06/2014  . Atrial fibrillation ([place]) 08/06/2014  . Hyponatremia 08/06/2014  . Chronic diastolic heart failure ([place]) 08/06/2014  . OSA (obstructive sleep apnea) 08/06/2014  . Memory loss or impairment 05/29/2014  . Current smoker 09/19/2013  . Asthma 11/29/2012  . Dizziness 11/15/2012  . Other dyspnea and respiratory abnormality 11/15/2012  .  Pain in joint involving lower leg 01/05/2011  . Type 2 diabetes mellitus without complication ([place]) 26/71/2458  . 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 Chronic rhinitis 08/11/2010  . Coronary artery disease 08/11/2010  . Esophageal reflux 08/11/2010  . Personal history of arthritis 08/11/2010  . Tear film insufficiency 06/07/2010  .  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 Keratoconjunctivitis sicca ([place]) 03/10/2010  . Lens replaced 03/10/2010  . Pterygium 03/10/2010  . Gastro-esophageal reflux disease with esophagitis 01/30/2006     Past Surgical History:  Procedure Laterality Date  . CARDIAC CATHETERIZATION    . COLONOSCOPY N/A 08/10/2014   Procedure: COLONOSCOPY;  Surgeon: Manya Silvas, MD;  Location: [facility] ENDOSCOPY;  Service: Endoscopy;  Laterality: N/A;  . COLONOSCOPY WITH PROPOFOL N/A 04/05/2016   Procedure: COLONOSCOPY WITH PROPOFOL;  Surgeon: Lollie Sails, MD;  Location: [facility] ENDOSCOPY;  Service: Endoscopy;  Laterality: N/A;  . COLONOSCOPY WITH PROPOFOL N/A 07/10/2019   Procedure: COLONOSCOPY WITH PROPOFOL;  Surgeon: Lucilla Lame, MD;  Location: [facility] ENDOSCOPY;  Service: Endoscopy;  Laterality: N/A;  . ESOPHAGOGASTRODUODENOSCOPY N/A 08/08/2014   Procedure: ESOPHAGOGASTRODUODENOSCOPY (EGD);  Surgeon: Manya Silvas, MD;  Location: [facility] ENDOSCOPY;  Service: Endoscopy;  Laterality: N/A;  plan for early afternoon  . ESOPHAGOGASTRODUODENOSCOPY (EGD) WITH PROPOFOL N/A 07/10/2019   Procedure: ESOPHAGOGASTRODUODENOSCOPY (EGD) WITH PROPOFOL;  Surgeon: Lucilla Lame, MD;  Location: [facility] ENDOSCOPY;  Service: Endoscopy;  Laterality: N/A;  . EYE SURGERY    . GIVENS CAPSULE STUDY N/A 08/12/2014   Procedure: GIVENS CAPSULE STUDY;  Surgeon: Manya Silvas, MD;  Location: [facility] ENDOSCOPY;  Service: Endoscopy;  Laterality: N/A;  . rectal fistula N/A      Prior to Admission medications   Medication Sig Start Date End Date Taking? Authorizing Provider  ACCU-CHEK GUIDE test strip E11.9   TO CHECK BLOOD GLUCOSE TWICE A DAY 08/06/19   [provider]  apixaban (ELIQUIS) 2.5 MG TABS tablet Take 1 tablet (2.5 mg total) by mouth 2 (two) times daily. 07/10/19   Lorella Nimrod, MD  atorvastatin (LIPITOR) 40 MG tablet Take 1 tablet (40 mg total) by mouth daily at 6 PM. 12/03/17   Demetrios Loll, MD  BD PEN  NEEDLE NANO 2ND GEN 32G X 4 MM MISC USE NIGHTLY WITH TRESIBA 08/28/19   [provider]  budesonide-formoterol (SYMBICORT) 160-4.5 MCG/ACT inhaler Inhale 2 puffs into the lungs 2 (two) times daily.    [provider]  digoxin (LANOXIN) 0.125 MG tablet Take 1 tablet (0.125 mg total) by mouth daily. Patient taking differently: Take 0.125 mg by mouth. TAKE ONCE A DAY EVERY OTHER DAY. 12/08/17   Hillary Bow, MD  donepezil (ARICEPT) 5 MG tablet Take 5 mg by mouth at bedtime.    [provider]  DULoxetine (CYMBALTA) 60 MG capsule Take 60 mg by mouth daily.    [provider]  ergocalciferol (VITAMIN D2) 1.25 MG (50000 UT) capsule Take 50,000 Units by mouth once a week.    [provider]  ferrous sulfate 325 (65 FE) MG EC tablet Take 1 tablet (325 mg total) by mouth 2 (two) times daily. Patient taking differently: Take 325 mg by mouth daily.  07/11/19 07/10/20  Lorella Nimrod, MD  finasteride (PROSCAR) 5 MG tablet Take 1 tablet (5 mg total) by mouth daily. 07/18/18   Stoioff, Ronda Fairly, MD  furosemide (LASIX) 20 MG tablet Take 40 mg by mouth daily.     [provider]  insulin degludec (TRESIBA FLEXTOUCH) 100 UNIT/ML SOPN  FlexTouch Pen Inject 30 Units into the skin at bedtime.     [provider]  ipratropium-albuterol (DUONEB) 0.5-2.5 (3) MG/3ML SOLN Inhale 3 mLs into the lungs every 6 (six) hours as needed for shortness of breath. 01/22/17   [provider]  latanoprost (XALATAN) 0.005 % ophthalmic solution latanoprost 0.005 % eye drops  INSTILL ONE DROP TO BOTH EYES AT BEDTIME.    [provider]  levothyroxine (SYNTHROID, LEVOTHROID) 112 MCG tablet Take 112 mcg by mouth daily before breakfast.     [provider]  Melatonin 3 MG TABS Take 3 mg by mouth at bedtime as needed (sleep).     [provider]  metoprolol tartrate (LOPRESSOR) 25 MG tablet Take 25 mg by mouth 2 (two) times daily.     [provider]  montelukast (SINGULAIR) 10 MG tablet Take 10 mg by mouth daily.     [provider]  omega-3 acid ethyl esters (LOVAZA) 1 G capsule Take 1 g by mouth daily.     [provider]  pantoprazole (PROTONIX) 40 MG tablet Take 40 mg by mouth daily. 06/04/19   [provider]  potassium chloride SA (K-DUR,KLOR-CON) 20 MEQ tablet Take 10 mEq by mouth 3 (three) times a week. Take 1/2 tablets ( ) by mouth twice daily for 3 days as directed Mon., Wed., Fri. 12/01/17   [provider]  sildenafil (REVATIO) 20 MG tablet Take 20 mg by mouth 3 (three) times daily. 05/09/19   [provider]  tamsulosin (FLOMAX) 0.4 MG CAPS capsule Take 0.4 mg by mouth daily. 05/07/19   [provider]  tiotropium (SPIRIVA) 18 MCG inhalation capsule Place 18 mcg into inhaler and inhale daily.    [provider]     Allergies Carvedilol, Penicillins, Aspirin, Aricept [donepezil hcl], and Catapres [clonidine hcl]   Family History  Problem Relation Age of Onset  . Diabetes Mother     Social History Social History   Tobacco Use  . Smoking status: Former Smoker    Quit date: 2000    Years since quitting: 21.9  . Smokeless tobacco: Former Neurosurgeon    Types: Engineer, drilling  . Vaping Use: Never used  Substance Use Topics  . Alcohol use: No  . Drug use: No    Review of Systems  Constitutional: Positive fever and chills.  ENT:   No sore throat. No rhinorrhea. Cardiovascular:   No chest pain or syncope. Respiratory:   Positive shortness of breath nonproductive cough. Gastrointestinal:   Negative for abdominal pain, vomiting and diarrhea.  Musculoskeletal:   Negative for focal pain or swelling All other systems reviewed and are negative except as documented above in ROS and HPI.  ____________________________________________   PHYSICAL EXAM:  VITAL SIGNS: ED Triage Vitals  Enc Vitals Group     BP 01/14/20 1803 109/80     Pulse Rate  01/14/20 1803 (!) 137     Resp 01/14/20 1803 (!) 22     Temp 01/14/20 1803 (!) 100.6 F (38.1 C)     Temp Source 01/14/20 1803 Oral     SpO2 01/14/20 1803 100 %     Weight 01/14/20 1804 194 lb 0.1 oz (88 kg)     Height 01/14/20 1804 5\' 5"  (1.651 m)     Head Circumference --      Peak Flow --      Pain Score 01/14/20 1803 2     Pain Loc --  Pain Edu? --      Excl. in [place]? --     Vital signs reviewed, nursing assessments reviewed.   Constitutional:   Awake alert and oriented. Eyes:   Conjunctivae are injected and inflamed bilaterally. EOMI. PERRL. ENT      Head:   Normocephalic and atraumatic.      Nose: Normal.      Mouth/Throat: Dry mucous membranes      Neck:   No meningismus. Full ROM. Hematological/Lymphatic/Immunilogical:   No cervical lymphadenopathy. Cardiovascular:   Irregularly irregular rhythm, heart rate 120-140. Symmetric bilateral radial and DP pulses.  No murmurs. Cap refill less than 2 seconds. Respiratory:   Normal respiratory effort without tachypnea/retractions. Breath sounds are clear and equal bilaterally. No wheezes/rales/rhonchi. Gastrointestinal:   Soft and nontender. Non distended. There is no CVA tenderness.  No rebound, rigidity, or guarding.  Musculoskeletal:   Normal range of motion in all extremities. No joint effusions.  No lower extremity tenderness.  Trace bilateral peripheral edema, symmetric.  Symmetric calf circumference. Neurologic:   Normal speech and language.  Motor grossly intact. No acute focal neurologic deficits are appreciated.  Skin:    Skin is warm, dry and intact. No rash noted.  No petechiae, purpura, or bullae.  ____________________________________________    LABS (pertinent positives/negatives) (all labs ordered are listed, but only abnormal results are displayed) Labs Reviewed  RESP PANEL BY RT-PCR (FLU A&B, COVID) ARPGX2 - Abnormal; Notable for the following components:      Result Value   SARS Coronavirus 2 by RT PCR  POSITIVE (*)    All other components within normal limits  COMPREHENSIVE METABOLIC PANEL - Abnormal; Notable for the following components:   Glucose, Bld 108 (*)    BUN 38 (*)    Creatinine, Ser 1.33 (*)    Total Protein 8.3 (*)    GFR, Estimated 54 (*)    All other components within normal limits  CBC WITH DIFFERENTIAL/PLATELET - Abnormal; Notable for the following components:   RBC 3.53 (*)    Hemoglobin 9.8 (*)    HCT 31.9 (*)    Lymphs Abs 0.4 (*)    All other components within normal limits  BRAIN NATRIURETIC PEPTIDE - Abnormal; Notable for the following components:   B Natriuretic Peptide 301.2 (*)    All other components within normal limits  TROPONIN I (HIGH SENSITIVITY) - Abnormal; Notable for the following components:   Troponin I (High Sensitivity) 24 (*)    All other components within normal limits  PROCALCITONIN  TROPONIN I (HIGH SENSITIVITY)   ____________________________________________   EKG  Interpreted by me Atrial fibrillation with RVR, rate of 128.  Normal axis.  Right bundle branch block.  No acute ischemic changes.  ____________________________________________    RADIOLOGY  DG Chest 2 View  Result Date: 01/14/2020 CLINICAL DATA:  Cough, difficulty breathing EXAM: CHEST - 2 VIEW COMPARISON:  08/06/2019 FINDINGS: Frontal and lateral enlargement the cardiac silhouette views of the chest demonstrate marked. There is mild central vascular congestion without airspace disease, effusion, or pneumothorax. Chronic elevation of the right hemidiaphragm. IMPRESSION: Enlarged cardiac silhouette with mild central vascular congestion. No acute airspace disease. Electronically Signed   By: Randa Ngo M.D.   On: 01/14/2020 18:19    ____________________________________________   PROCEDURES Procedures  ____________________________________________  DIFFERENTIAL DIAGNOSIS   Pneumonia, pleural effusion, pulmonary edema, COVID-19, influenza, viral  bronchitis  CLINICAL IMPRESSION / ASSESSMENT AND PLAN / ED COURSE  Medications ordered in the ED: Medications -  No data to display  Pertinent labs & imaging results that were available during my care of the patient were reviewed by me and considered in my medical decision making (see chart for details).  CANDELARIO BELONE was evaluated in Emergency Department on 01/14/2020 for the symptoms described in the history of present illness. He was evaluated in the context of the global COVID-19 pandemic, which necessitated consideration that the patient might be at risk for infection with the SARS-CoV-2 virus that causes COVID-19. Institutional protocols and algorithms that pertain to the evaluation of patients at risk for COVID-19 are in a state of rapid change based on information released by regulatory bodies including the CDC and federal and state organizations. These policies and algorithms were followed during the patient's care in the ED.   Patient presents with fevers chills shortness of breath and cough.  Chest x-ray image viewed by me, appears unremarkable without effusion edema or infiltrate.  Radiology report agrees that it is unremarkable.  Vital signs show tachycardia related to atrial fibrillation, low-grade fever, tachypnea.  Blood pressure is normal, oxygenation is normal at rest sitting upright.  Presentation highly likely to be viral.  Children at home recently had Covid as well per the son.  Patient's Covid test is positive.  He is not septic, but does have uncontrolled atrial fibrillation, acute deterioration of his functional status.  With available support at home, he will be unable to manage his ADLs in the setting of this illness, and likely experiencing hypoxia with standing.  I will give Solu-Medrol, ceftriaxone and azithromycin for pneumonia coverage, remdesivir and plan to hospitalize.      ____________________________________________   FINAL CLINICAL IMPRESSION(S) / ED  DIAGNOSES    Final diagnoses:  COVID-19 virus infection  Chronic congestive heart failure, unspecified heart failure type ([place])  Chronic obstructive pulmonary disease, unspecified COPD type ([place])  Type 2 diabetes mellitus without complication, with long-term current use of insulin [facility])     ED Discharge Orders    None      Portions of this note were generated with dragon dictation software. Dictation errors may occur despite best attempts at proofreading.   Carrie Mew, MD 01/14/20 2048

## 2020-01-14 NOTE — Consult Note (Signed)
Remdesivir - Pharmacy Brief Note   O:  ALT: 18 CXR: "Enlarged cardiac silhouette with mild central vascular congestion. No acute airspace disease" SpO2: 96-98% on room air   A/P:  12/21 SARS-CoV-2 PCR (+)  Remdesivir 200 mg IVPB once followed by 100 mg IVPB daily x 4 days.   Benita Gutter 01/14/2020 8:59 PM

## 2020-01-14 NOTE — H&P (Signed)
History and Physical    Derek Shelton TIW:[id number] DOB: 10-17-1940 DOA: 01/14/2020  PCP: Gladstone Lighter, MD   Patient coming from: Home   Chief Complaint: Cough, SOB, fevers, loss of appetite, lethargy   HPI: Derek Shelton is a 79 y.o. male with medical history significant for chronic diastolic CHF, pulmonary arterial hypertension, history of CVA, diabetes, atrial fibrillation on Eliquis, chronic kidney disease stage IIIa, chronic anemia, dementia, hypothyroidism, and CAD, now presenting to the emergency department with 3 days of progressive cough, shortness of breath, fevers, lethargy, and loss of appetite.  Household contacts have tested positive for COVID-19.  Patient has not had any chest pain, abdominal pain, diarrhea, or vomiting.  No recent leg swelling or tenderness.  No hemoptysis.  Patient's son at the bedside assists with the history and notes that the patient has been monitoring her O2 saturations at home and has been dropping into the mid 80s with minimal exertion.  ED Course: Upon arrival to the ED, patient is found to be febrile to 38.1 C, saturating mid 90s while at rest, mildly tachypneic, tachycardic as high as 150 in atrial fibrillation, and with blood pressure 118/90.  EKG features atrial fibrillation with RVR, rate 128.  Chest x-ray notable for cardiomegaly and mild vascular congestion.  Chemistry panel with creatinine 1.33.  CBC notable for hemoglobin of 9.8, similar to last month.  High-sensitivity troponin is slightly elevated and BNP is 300.  Blood cultures were ordered from the emergency department and the patient was started on remdesivir, Solu-Medrol, Rocephin, and azithromycin.  Review of Systems:  All other systems reviewed and apart from HPI, are negative.  Past Medical History:  Diagnosis Date  . Anemia   . Asthma   . CHF (congestive heart failure) ([place])   . COPD (chronic obstructive pulmonary disease) ([place])   . Coronary artery disease   . Dementia  ([place])    Per son's report  . Diabetes mellitus without complication ([place])   . Dysrhythmia    Atrial Fibrillation  . GERD (gastroesophageal reflux disease)   . GI bleed   . Hyperlipidemia   . Hypertension   . Hypothyroidism   . Shortness of breath dyspnea   . Sleep apnea   . Stroke ([place])   . Thyroid disease     Past Surgical History:  Procedure Laterality Date  . CARDIAC CATHETERIZATION    . COLONOSCOPY N/A 08/10/2014   Procedure: COLONOSCOPY;  Surgeon: Manya Silvas, MD;  Location: [facility] ENDOSCOPY;  Service: Endoscopy;  Laterality: N/A;  . COLONOSCOPY WITH PROPOFOL N/A 04/05/2016   Procedure: COLONOSCOPY WITH PROPOFOL;  Surgeon: Lollie Sails, MD;  Location: [facility] ENDOSCOPY;  Service: Endoscopy;  Laterality: N/A;  . COLONOSCOPY WITH PROPOFOL N/A 07/10/2019   Procedure: COLONOSCOPY WITH PROPOFOL;  Surgeon: Lucilla Lame, MD;  Location: [facility] ENDOSCOPY;  Service: Endoscopy;  Laterality: N/A;  . ESOPHAGOGASTRODUODENOSCOPY N/A 08/08/2014   Procedure: ESOPHAGOGASTRODUODENOSCOPY (EGD);  Surgeon: Manya Silvas, MD;  Location: [facility] ENDOSCOPY;  Service: Endoscopy;  Laterality: N/A;  plan for early afternoon  . ESOPHAGOGASTRODUODENOSCOPY (EGD) WITH PROPOFOL N/A 07/10/2019   Procedure: ESOPHAGOGASTRODUODENOSCOPY (EGD) WITH PROPOFOL;  Surgeon: Lucilla Lame, MD;  Location: [facility] ENDOSCOPY;  Service: Endoscopy;  Laterality: N/A;  . EYE SURGERY    . GIVENS CAPSULE STUDY N/A 08/12/2014   Procedure: GIVENS CAPSULE STUDY;  Surgeon: Manya Silvas, MD;  Location: [facility] ENDOSCOPY;  Service: Endoscopy;  Laterality: N/A;  . rectal fistula N/A     Social History:  reports that he quit smoking about 21 years ago. He has quit using smokeless tobacco.  His smokeless tobacco use included chew. He reports that he does not drink alcohol and does not use drugs.  Allergies  Allergen Reactions  . Carvedilol Shortness Of Breath    Other reaction(s): Asthma, Shortness of Breath  . Penicillins Shortness Of  Breath    Other reaction(s): Other (See Comments) Other reaction(s): RASH Other reaction(s): RASH Other reaction(s): RASH   . Aspirin     Other reaction(s): Other (See Comments) On eliquis already- high risk of bleeding  . Aricept [Donepezil Hcl] Other (See Comments)    Medication cause significant bradycardia  . Catapres [Clonidine Hcl]     Family History  Problem Relation Age of Onset  . Diabetes Mother      Prior to Admission medications   Medication Sig Start Date End Date Taking? Authorizing Provider  ACCU-CHEK GUIDE test strip E11.9   TO CHECK BLOOD GLUCOSE TWICE A DAY 08/06/19   [provider]  atorvastatin (LIPITOR) 40 MG tablet Take 1 tablet (40 mg total) by mouth daily at 6 PM. 12/03/17   Demetrios Loll, MD  BD PEN NEEDLE NANO 2ND GEN 32G X 4 MM MISC USE NIGHTLY WITH TRESIBA 08/28/19   [provider]  budesonide-formoterol (SYMBICORT) 160-4.5 MCG/ACT inhaler Inhale 2 puffs into the lungs 2 (two) times daily.    [provider]  digoxin (LANOXIN) 0.125 MG tablet Take 1 tablet (0.125 mg total) by mouth daily. Patient taking differently: Take 0.125 mg by mouth. TAKE ONCE A DAY EVERY OTHER DAY. 12/08/17   Hillary Bow, MD  donepezil (ARICEPT) 5 MG tablet Take 5 mg by mouth at bedtime.    [provider]  DULoxetine (CYMBALTA) 60 MG capsule Take 60 mg by mouth daily.    [provider]  ELIQUIS 5 MG TABS tablet Take 5 mg by mouth 2 (two) times daily. 12/09/19   [provider]  ergocalciferol (VITAMIN D2) 1.25 MG (50000 UT) capsule Take 50,000 Units by mouth once a week.    [provider]  ferrous sulfate 325 (65 FE) MG EC tablet Take 1 tablet (325 mg total) by mouth 2 (two) times daily. Patient taking differently: Take 325 mg by mouth daily.  07/11/19 07/10/20  Lorella Nimrod, MD  finasteride (PROSCAR) 5 MG tablet Take 1 tablet (5 mg total) by mouth daily. 07/18/18   Stoioff, Ronda Fairly, MD  furosemide (LASIX) 20 MG tablet  Take 40 mg by mouth daily.     [provider]  insulin degludec (TRESIBA FLEXTOUCH) 100 UNIT/ML SOPN FlexTouch Pen Inject 30 Units into the skin at bedtime.     [provider]  ipratropium-albuterol (DUONEB) 0.5-2.5 (3) MG/3ML SOLN Inhale 3 mLs into the lungs every 6 (six) hours as needed for shortness of breath. 01/22/17   [provider]  latanoprost (XALATAN) 0.005 % ophthalmic solution latanoprost 0.005 % eye drops  INSTILL ONE DROP TO BOTH EYES AT BEDTIME.    [provider]  levothyroxine (SYNTHROID, LEVOTHROID) 112 MCG tablet Take 112 mcg by mouth daily before breakfast.     [provider]  loratadine (CLARITIN) 10 MG tablet Take 10 mg by mouth daily. 12/17/19   [provider]  Melatonin 3 MG TABS Take 3 mg by mouth at bedtime as needed (sleep).     [provider]  metoprolol tartrate (LOPRESSOR) 25 MG tablet Take 25 mg by mouth 2 (two) times daily.  [provider]  montelukast (SINGULAIR) 10 MG tablet Take 10 mg by mouth daily.     [provider]  omega-3 acid ethyl esters (LOVAZA) 1 G capsule Take 1 g by mouth daily.     [provider]  pantoprazole (PROTONIX) 40 MG tablet Take 40 mg by mouth daily. 06/04/19   [provider]  Potassium Chloride ER 20 MEQ TBCR Take 1 tablet by mouth daily. 12/17/19   [provider]  potassium chloride SA (K-DUR,KLOR-CON) 20 MEQ tablet Take 10 mEq by mouth 3 (three) times a week. Take 1/2 tablets (10MEQ) by mouth twice daily for 3 days as directed Mon., Wed., Fri. 12/01/17   [provider]  sildenafil (REVATIO) 20 MG tablet Take 20 mg by mouth 3 (three) times daily. 05/09/19   [provider]  tamsulosin (FLOMAX) 0.4 MG CAPS capsule Take 0.4 mg by mouth daily. 05/07/19   [provider]  tiotropium (SPIRIVA) 18 MCG inhalation capsule Place 18 mcg into inhaler and inhale daily.    [provider]  torsemide  (DEMADEX) 20 MG tablet Take 20 mg by mouth 2 (two) times daily. 01/09/20   [provider]    Physical Exam: Vitals:   01/14/20 1804 01/14/20 1900 01/14/20 1930 01/14/20 2000  BP:  124/86 (!) 117/102 (!) 118/92  Pulse:  (!) 150 (!) 138 (!) 120  Resp:  (!) 25 18 20   Temp:      TempSrc:      SpO2:  98% 95% 96%  Weight: 88 kg     Height: 5\' 5"  (1.651 m)       Constitutional: NAD, calm  Eyes: PERTLA, lids normal. Left conjunctival injection.  ENMT: Mucous membranes are moist. Posterior pharynx clear of any exudate or lesions.   Neck: normal, supple, no masses, no thyromegaly Respiratory: Mild tachypnea, no wheezing. No pallor or cyanosis.  Cardiovascular: Rate ~120 and irregularly irregular. No extremity edema.   Abdomen: No distension, no tenderness, soft. Bowel sounds active.  Musculoskeletal: no clubbing / cyanosis. No joint deformity upper and lower extremities.   Skin: Warm, dry, well-perfused. Poor turgor.  Neurologic: CN 2-12 grossly intact. Sensation intact. Moving all extremities.  Psychiatric: Alert and oriented to person, place, and situation. Calm and cooperative.    Labs and Imaging on Admission: I have personally reviewed following labs and imaging studies  CBC: Recent Labs  Lab 01/14/20 1829  WBC 6.9  NEUTROABS 5.6  HGB 9.8*  HCT 31.9*  MCV 90.4  PLT [id number]   Basic Metabolic Panel: Recent Labs  Lab 01/14/20 1829  NA 138  K 3.6  CL 98  CO2 29  GLUCOSE 108*  BUN 38*  CREATININE 1.33*  CALCIUM 8.9   GFR: Estimated Creatinine Clearance: 44.4 mL/min (A) (by C-G formula based on SCr of 1.33 mg/dL (H)). Liver Function Tests: Recent Labs  Lab 01/14/20 1829  AST 30  ALT 18  ALKPHOS 95  BILITOT 0.9  PROT 8.3*  ALBUMIN 3.9   No results for input(s): LIPASE, AMYLASE in the last 168 hours. No results for input(s): AMMONIA in the last 168 hours. Coagulation Profile: No results for input(s): INR, PROTIME in the last 168 hours. Cardiac  Enzymes: No results for input(s): CKTOTAL, CKMB, CKMBINDEX, TROPONINI in the last 168 hours. BNP (last 3 results) No results for input(s): PROBNP in the last 8760 hours. HbA1C: No results for input(s): HGBA1C in the last 72 hours. CBG: No results for input(s): GLUCAP in the last  168 hours. Lipid Profile: Recent Labs    01/14/20 1933  TRIG 32   Thyroid Function Tests: No results for input(s): TSH, T4TOTAL, FREET4, T3FREE, THYROIDAB in the last 72 hours. Anemia Panel: No results for input(s): VITAMINB12, FOLATE, FERRITIN, TIBC, IRON, RETICCTPCT in the last 72 hours. Urine analysis:    Component Value Date/Time   COLORURINE YELLOW (A) 04/06/2019 1954   APPEARANCEUR CLEAR (A) 04/06/2019 1954   APPEARANCEUR Clear 07/17/2018 1601   LABSPEC 1.012 04/06/2019 1954   LABSPEC 1.004 05/06/2014 2100   PHURINE 5.0 04/06/2019 [place] NEGATIVE 04/06/2019 1954   GLUCOSEU Negative 05/06/2014 2100   HGBUR NEGATIVE 04/06/2019 [place] NEGATIVE 04/06/2019 1954   BILIRUBINUR Negative 07/17/2018 1601   BILIRUBINUR Negative 05/06/2014 2100   KETONESUR NEGATIVE 04/06/2019 [place] NEGATIVE 04/06/2019 1954   NITRITE NEGATIVE 04/06/2019 1954   LEUKOCYTESUR NEGATIVE 04/06/2019 1954   LEUKOCYTESUR Negative 05/06/2014 2100   Sepsis Labs: @LABRCNTIP (procalcitonin:4,lacticidven:4) ) Recent Results (from the past 240 hour(s))  Resp Panel by RT-PCR (Flu A&B, Covid) Nasopharyngeal Swab     Status: Abnormal   Collection Time: 01/14/20  6:41 PM   Specimen: Nasopharyngeal Swab; Nasopharyngeal(NP) swabs in vial transport medium  Result Value Ref Range Status   SARS Coronavirus 2 by RT PCR POSITIVE (A) NEGATIVE Final    Comment: RESULT CALLED TO, READ BACK BY AND VERIFIED WITH: CANDICE KIO @1934  ON 01/14/20 SK[facility] (NOTE) SARS-CoV-2 target nucleic acids are DETECTED.  The SARS-CoV-2 RNA is generally detectable in upper respiratory specimens during the acute phase of infection.  Positive results are indicative of the presence of the identified virus, but do not rule out bacterial infection or co-infection with other pathogens not detected by the test. Clinical correlation with patient history and other diagnostic information is necessary to determine patient infection status. The expected result is Negative.  Fact Sheet for Patients: EntrepreneurPulse.com.au  Fact Sheet for Healthcare Providers: IncredibleEmployment.be  This test is not yet approved or cleared by the [place] FDA and  has been authorized for detection and/or diagnosis of SARS-CoV-2 by FDA under an Emergency Use Authorization (EUA).  This EUA will remain in effect (meaning this test can be  used) for the duration of  the COVID-19 declaration under Section 564(b)(1) of the Act, 21 U.S.C. section 360bbb-3(b)(1), unless the authorization is terminated or revoked sooner.     Influenza A by PCR NEGATIVE NEGATIVE Final   Influenza B by PCR NEGATIVE NEGATIVE Final    Comment: (NOTE) The Xpert Xpress SARS-CoV-2/FLU/RSV plus assay is intended as an aid in the diagnosis of influenza from Nasopharyngeal swab specimens and should not be used as a sole basis for treatment. Nasal washings and aspirates are unacceptable for Xpert Xpress SARS-CoV-2/FLU/RSV testing.  Fact Sheet for Patients: EntrepreneurPulse.com.au  Fact Sheet for Healthcare Providers: IncredibleEmployment.be  This test is not yet approved or cleared by the [place] FDA and has been authorized for detection and/or diagnosis of SARS-CoV-2 by FDA under an Emergency Use Authorization (EUA). This EUA will remain in effect (meaning this test can be used) for the duration of the COVID-19 declaration under Section 564(b)(1) of the Act, 21 U.S.C. section 360bbb-3(b)(1), unless the authorization is terminated or revoked.  Performed at [facility],  [address], [place], [place] [address]      Radiological Exams on Admission: DG Chest 2 View  Result Date: 01/14/2020 CLINICAL DATA:  Cough, difficulty breathing EXAM: CHEST - 2 VIEW COMPARISON:  08/06/2019  FINDINGS: Frontal and lateral enlargement the cardiac silhouette views of the chest demonstrate marked. There is mild central vascular congestion without airspace disease, effusion, or pneumothorax. Chronic elevation of the right hemidiaphragm. IMPRESSION: Enlarged cardiac silhouette with mild central vascular congestion. No acute airspace disease. Electronically Signed   By: Randa Ngo M.D.   On: 01/14/2020 18:19    EKG: Independently reviewed. Atrial fibrillation with RVR, rate 128, PVCs, RBBB.   Assessment/Plan   1. COVID-19 with acute respiratory disease; possible bacterial PNA; acute hypoxic respiratory failure  - Patient with COVID-positive contacts at home presents with 3 days of progressive cough, SOB, fevers, lethargy, and loss of appetite, was saturating 85% at home and is found to be COVID positive with tachypnea, tachycardia, and fevers  - He was started on remdesivir, Solu-Medrol, Rocephin, and azithromycin in ED - Continue remdesivir and Solu-Medrol, continue Rocephin and azithromycin, continue supplemental O2 as needed, trend markers and follow cultures and clinical course   2. Atrial fibrillation with RVR  - Rates 120-140 in ED with stable BP  - Pharmacy medication-reconciliation pending, son at bedside confirms pt taking metoprolol and Eliquis  - Continue oral metoprolol, use IV Lopressor as needed for rapid rate  - CHADS-VASc is 55 (age x2, CVA x2, DM, HTN, CHF), continue Eliquis    3. COPD  - No wheezing on admission  - Continue inhalers   4. Chronic diastolic CHF; PAH   - Appears compensated, had not been eating or drinking much last 2-3 days, mild congestion noted on CXR likely related to rapid HR  - Pharmacy medication-reconciliation pending, plan to  SLIV and follow weight and I/Os for now    5. CKD IIIa  - SCr is 1.33, similar to priors  - Renally-dose medications, monitor     6. Insulin-dependent DM  - A1c was 6.1% in October 2021  - He has not been eating much for the past 2-3 days and glucose was low at home on day of admission  - Check CBGs, use linagliptin and low-intensity SSI for now    7. CAD; elevated troponin  - No anginal complaints, slight troponin elevation likely related to rapid rate and acute respiratory illness rather than ACS  - Continue cardiac monitoring, check a second troponin, continue Eliquis and beta-blocker   8. Hypothyroidism   - Plan to continue Synthroid pending pharmacy medication-reconciliation    9. Dementia  - Calm in ED  - Delirium precautions, follow-up pharmacy medication-reconciliation    DVT prophylaxis: Eliquis  Code Status: Full  Family Communication: One son updated at bedside and another by phone  Disposition Plan:  Patient is from: Home  Anticipated d/c is to: TBD Anticipated d/c date is: 01/18/20 Patient currently: Pending improvement in respiratory status, HR, and fatigue  Consults called: None  Admission status: Inpatient     Vianne Bulls, MD [facility]  01/14/2020, 9:56 PM

## 2020-01-14 NOTE — ED Notes (Signed)
1 set of blood cultures sent to lab, lactic and blue top.

## 2020-01-14 NOTE — ED Triage Notes (Signed)
Pt to ED via POV w/ son.  Son reports patient has had a productive cough, fevers, and shortness of breath x3 days. For the last 2 days he has been worsening, pt has sleep apnea and does not wear his cpap so hasn't been sleeping well. Pt has had decreased PO intake, decreased fluid intake. Unknown if fevers at home.   Pt takes lasix for chf. Son reports cbg was low earlier- pt type 2 dm.

## 2020-01-15 DIAGNOSIS — U071 COVID-19: Secondary | ICD-10-CM | POA: Diagnosis not present

## 2020-01-15 DIAGNOSIS — I5032 Chronic diastolic (congestive) heart failure: Secondary | ICD-10-CM | POA: Diagnosis not present

## 2020-01-15 DIAGNOSIS — I4891 Unspecified atrial fibrillation: Secondary | ICD-10-CM | POA: Diagnosis not present

## 2020-01-15 DIAGNOSIS — N1831 Chronic kidney disease, stage 3a: Secondary | ICD-10-CM | POA: Diagnosis not present

## 2020-01-15 LAB — COMPREHENSIVE METABOLIC PANEL
ALT: 17 U/L (ref 0–44)
AST: 34 U/L (ref 15–41)
Albumin: 3.5 g/dL (ref 3.5–5.0)
Alkaline Phosphatase: 88 U/L (ref 38–126)
Anion gap: 11 (ref 5–15)
BUN: 43 mg/dL — ABNORMAL HIGH (ref 8–23)
CO2: 25 mmol/L (ref 22–32)
Calcium: 9 mg/dL (ref 8.9–10.3)
Chloride: 99 mmol/L (ref 98–111)
Creatinine, Ser: 1.4 mg/dL — ABNORMAL HIGH (ref 0.61–1.24)
GFR, Estimated: 50 mL/min — ABNORMAL LOW (ref >60)
Glucose, Bld: 116 mg/dL — ABNORMAL HIGH (ref 70–99)
Potassium: 4.1 mmol/L (ref 3.5–5.1)
Sodium: 135 mmol/L (ref 135–145)
Total Bilirubin: 0.7 mg/dL (ref 0.3–1.2)
Total Protein: 7.8 g/dL (ref 6.5–8.1)

## 2020-01-15 LAB — PHOSPHORUS: Phosphorus: 5.2 mg/dL — ABNORMAL HIGH (ref 2.5–4.6)

## 2020-01-15 LAB — GLUCOSE, CAPILLARY
Glucose-Capillary: 115 mg/dL — ABNORMAL HIGH (ref 70–99)
Glucose-Capillary: 204 mg/dL — ABNORMAL HIGH (ref 70–99)
Glucose-Capillary: 209 mg/dL — ABNORMAL HIGH (ref 70–99)
Glucose-Capillary: 207 mg/dL — ABNORMAL HIGH (ref 70–99)
Glucose-Capillary: 199 mg/dL — ABNORMAL HIGH (ref 70–99)

## 2020-01-15 LAB — CBC WITH DIFFERENTIAL/PLATELET
Abs Immature Granulocytes: 0.03 10*3/uL (ref 0.00–0.07)
Basophils Absolute: 0 10*3/uL (ref 0.0–0.1)
Basophils Relative: 0 %
Eosinophils Absolute: 0 10*3/uL (ref 0.0–0.5)
Eosinophils Relative: 0 %
HCT: 30.3 % — ABNORMAL LOW (ref 39.0–52.0)
Hemoglobin: 9.6 g/dL — ABNORMAL LOW (ref 13.0–17.0)
Immature Granulocytes: 1 %
Lymphocytes Relative: 7 %
Lymphs Abs: 0.3 10*3/uL — ABNORMAL LOW (ref 0.7–4.0)
MCH: 28 pg (ref 26.0–34.0)
MCHC: 31.7 g/dL (ref 30.0–36.0)
MCV: 88.3 fL (ref 80.0–100.0)
Monocytes Absolute: 0.1 10*3/uL (ref 0.1–1.0)
Monocytes Relative: 3 %
Neutro Abs: 3.9 10*3/uL (ref 1.7–7.7)
Neutrophils Relative %: 89 %
Platelets: 144 10*3/uL — ABNORMAL LOW (ref 150–400)
RBC: 3.43 MIL/uL — ABNORMAL LOW (ref 4.22–5.81)
RDW: 15.7 % — ABNORMAL HIGH (ref 11.5–15.5)
WBC: 4.4 10*3/uL (ref 4.0–10.5)
nRBC: 0 % (ref 0.0–0.2)

## 2020-01-15 LAB — C-REACTIVE PROTEIN
CRP: 1.3 mg/dL — ABNORMAL HIGH (ref <1.0)
CRP: 2.3 mg/dL — ABNORMAL HIGH (ref <1.0)

## 2020-01-15 LAB — FERRITIN: Ferritin: 26 ng/mL (ref 24–336)

## 2020-01-15 LAB — MAGNESIUM: Magnesium: 2.3 mg/dL (ref 1.7–2.4)

## 2020-01-15 LAB — PROCALCITONIN: Procalcitonin: 0.39 ng/mL

## 2020-01-15 LAB — FIBRIN DERIVATIVES D-DIMER (ARMC ONLY): Fibrin derivatives D-dimer (ARMC): 687.35 ng/mL (FEU) — ABNORMAL HIGH (ref 0.00–499.00)

## 2020-01-15 MED ORDER — FINASTERIDE 5 MG PO TABS
5.0000 mg | ORAL_TABLET | Freq: Every day | ORAL | Status: DC
  Administered 2020-01-15 – 2020-01-21 (×7): 5 mg via ORAL
  Filled 2020-01-15 (×7): qty 1

## 2020-01-15 MED ORDER — DILTIAZEM HCL 25 MG/5ML IV SOLN
15.0000 mg | Freq: Once | INTRAVENOUS | Status: AC
  Administered 2020-01-15: 20:00:00 15 mg via INTRAVENOUS
  Filled 2020-01-15: qty 5

## 2020-01-15 MED ORDER — TORSEMIDE 20 MG PO TABS
40.0000 mg | ORAL_TABLET | Freq: Every day | ORAL | Status: DC
  Administered 2020-01-15: 13:00:00 40 mg via ORAL
  Filled 2020-01-15: qty 2

## 2020-01-15 MED ORDER — INSULIN GLARGINE 100 UNIT/ML ~~LOC~~ SOLN
10.0000 [IU] | Freq: Every day | SUBCUTANEOUS | Status: DC
  Administered 2020-01-15 – 2020-01-18 (×3): 10 [IU] via SUBCUTANEOUS
  Filled 2020-01-15 (×3): qty 0.1

## 2020-01-15 MED ORDER — TAMSULOSIN HCL 0.4 MG PO CAPS
0.4000 mg | ORAL_CAPSULE | Freq: Every day | ORAL | Status: DC
  Administered 2020-01-15 – 2020-01-21 (×7): 0.4 mg via ORAL
  Filled 2020-01-15 (×7): qty 1

## 2020-01-15 MED ORDER — POTASSIUM CHLORIDE CRYS ER 20 MEQ PO TBCR
20.0000 meq | EXTENDED_RELEASE_TABLET | Freq: Every day | ORAL | Status: DC
  Administered 2020-01-16 – 2020-01-20 (×5): 20 meq via ORAL
  Filled 2020-01-15 (×5): qty 1

## 2020-01-15 MED ORDER — INSULIN GLARGINE 100 UNIT/ML ~~LOC~~ SOLN
15.0000 [IU] | Freq: Every day | SUBCUTANEOUS | Status: DC
  Filled 2020-01-15: qty 0.15

## 2020-01-15 MED ORDER — LEVOTHYROXINE SODIUM 112 MCG PO TABS
112.0000 ug | ORAL_TABLET | Freq: Every day | ORAL | Status: DC
  Administered 2020-01-17 – 2020-01-21 (×5): 112 ug via ORAL
  Filled 2020-01-15 (×6): qty 1

## 2020-01-15 MED ORDER — ATORVASTATIN CALCIUM 20 MG PO TABS
40.0000 mg | ORAL_TABLET | Freq: Every day | ORAL | Status: DC
  Administered 2020-01-15 – 2020-01-20 (×6): 40 mg via ORAL
  Filled 2020-01-15 (×6): qty 2

## 2020-01-15 MED ORDER — SILDENAFIL CITRATE 20 MG PO TABS
20.0000 mg | ORAL_TABLET | Freq: Three times a day (TID) | ORAL | Status: DC
  Administered 2020-01-15 – 2020-01-21 (×16): 20 mg via ORAL
  Filled 2020-01-15 (×20): qty 1

## 2020-01-15 MED ORDER — FERROUS SULFATE 325 (65 FE) MG PO TABS
325.0000 mg | ORAL_TABLET | Freq: Every day | ORAL | Status: DC
Start: 2020-01-16 — End: 2020-01-21
  Administered 2020-01-16 – 2020-01-21 (×6): 325 mg via ORAL
  Filled 2020-01-15 (×6): qty 1

## 2020-01-15 MED ORDER — DONEPEZIL HCL 5 MG PO TABS
5.0000 mg | ORAL_TABLET | Freq: Every day | ORAL | Status: DC
  Administered 2020-01-15 – 2020-01-20 (×5): 5 mg via ORAL
  Filled 2020-01-15 (×8): qty 1

## 2020-01-15 MED ORDER — DULOXETINE HCL 30 MG PO CPEP
60.0000 mg | ORAL_CAPSULE | Freq: Every day | ORAL | Status: DC
  Administered 2020-01-15 – 2020-01-21 (×7): 60 mg via ORAL
  Filled 2020-01-15 (×7): qty 2

## 2020-01-15 NOTE — Progress Notes (Addendum)
Progress Note    Derek Shelton  [id number] DOB: 1940-02-13  DOA: 01/14/2020 PCP: Gladstone Lighter, MD      Brief Narrative:    Medical records reviewed and are as summarized below:  Derek Shelton is a 79 y.o. male with medical history significant for chronic diastolic CHF, pulmonary arterial hypertension, history of CVA, diabetes, atrial fibrillation on Eliquis, chronic kidney disease stage IIIa, chronic anemia, dementia, hypothyroidism, and CAD, now presenting to the emergency department with 3 days of progressive cough, shortness of breath, fevers, lethargy, and loss of appetite.  Household contacts have tested positive for COVID-19.       Assessment/Plan:   Principal Problem:   Acute respiratory disease due to COVID-19 virus Active Problems:   Chronic diastolic heart failure (HCC)   Atrial fibrillation with RVR (HCC)   Coronary artery disease   Essential hypertension   Normocytic anemia   Dementia without behavioral disturbance (HCC)   History of CVA (cerebrovascular accident)   Chronic kidney disease, stage 3a ([place])   Type II diabetes mellitus with renal manifestations (HCC)   COPD (chronic obstructive pulmonary disease) (HCC)   Hypothyroidism   Body mass index is 32.28 kg/m.  (Obesity)    COVID-19 with acute respiratory disease and likely underlying pneumonia; acute hypoxic respiratory failure   Hypoxia is improving and he is tolerating room air. Continue IV remdesivir and IV steroids.   Doubt bacterial pneumonia at this time.  Discontinue IV antibiotics for now. Follow-up blood cultures.  Repeat chest x-ray tomorrow.  Atrial fibrillation with RVR  Continue metoprolol and Eliquis. - CHADS-VASc is 84 (age x2, CVA x2, DM, HTN, CHF)  COPD  Stable.  Continue bronchodilators  Chronic diastolic CHF; PAH   Resume home torsemide.  Monitor BMP daily weight.   CKD IIIa  Creatinine is stable.  Continue to monitor.   Insulin-dependent DM  -  A1c was 6.1% in October 2021  He takes [place] at home but this is nonformulary so it will be replaced with Lantus. Check glucose levels closely and adjust insulin therapy as needed.  Anticipate hyperglycemia because of treatment with steroids.  CAD; elevated troponin  Mildly elevated troponin is likely due to demand ischemia. Continue metoprolol and Eliquis.  Hypothyroidism   Continue Synthroid    Dementia  Continue supportive care   Plan of care was discussed with his son, Deatra Canter, at the bedside.  He gave me a list of patient's home medications.  We went through this list one by one and home medications were confirmed and continued.  He had one of his brothers on speaker phone who also confirmed the home medications.  All his questions were answered.   Plan of care was also discussed with Dr. Deboraha Sprang, his other son, over the phone.  He wanted to know whether patient could have IV  Regeneron infusion in the hospital.  I explained that patient cannot have IV Regeneron as an inpatient per hospital policy.  He requested that patient be discharged home tomorrow, if he is doing better, so that he can have IV Regeneron as an outpatient.      Diet Order            Diet heart healthy/carb modified Room service appropriate? Yes; Fluid consistency: Thin  Diet effective now                    Consultants:  None  Procedures:  None    Medications:   .  apixaban  5 mg Oral BID  . atorvastatin  40 mg Oral q1800  . donepezil  5 mg Oral QHS  . DULoxetine  60 mg Oral Daily  . [START ON 01/16/2020] ferrous sulfate  325 mg Oral Daily  . finasteride  5 mg Oral Daily  . insulin aspart  0-5 Units Subcutaneous QHS  . insulin aspart  0-9 Units Subcutaneous TID WC  . insulin glargine  15 Units Subcutaneous QHS  . Ipratropium-Albuterol  1 puff Inhalation Q6H  . [START ON 01/16/2020] levothyroxine  112 mcg Oral Q0600  . linagliptin  5 mg Oral Daily  . methylPREDNISolone (SOLU-MEDROL)  injection  90 mg Intravenous Q12H   Followed by  . [START ON 01/18/2020] predniSONE  50 mg Oral Daily  . metoprolol tartrate  25 mg Oral BID  . mometasone-formoterol  2 puff Inhalation BID  . montelukast  10 mg Oral QHS  . pantoprazole  40 mg Oral Daily  . [START ON 01/16/2020] potassium chloride SA  20 mEq Oral Daily  . sildenafil  20 mg Oral TID  . tamsulosin  0.4 mg Oral Daily  . tiotropium  18 mcg Inhalation Daily  . torsemide  40 mg Oral Daily   Continuous Infusions: . sodium chloride 250 mL (01/15/20 1020)  . remdesivir 100 mg in NS 100 mL 100 mg (01/15/20 1021)     Anti-infectives (From admission, onward)   Start     Dose/Rate Route Frequency Ordered Stop   01/15/20 1000  remdesivir 100 mg in sodium chloride 0.9 % 100 mL IVPB       "Followed by" Linked Group Details   100 mg 200 mL/hr over 30 Minutes Intravenous Daily 01/14/20 2101 01/19/20 0959   01/14/20 2200  remdesivir 200 mg in sodium chloride 0.9% 250 mL IVPB       "Followed by" Linked Group Details   200 mg 580 mL/hr over 30 Minutes Intravenous Once 01/14/20 2101 01/15/20 0106   01/14/20 2045  cefTRIAXone (ROCEPHIN) 2 g in sodium chloride 0.9 % 100 mL IVPB  Status:  Discontinued        2 g 200 mL/hr over 30 Minutes Intravenous Every 24 hours 01/14/20 2044 01/15/20 1542   01/14/20 2045  azithromycin (ZITHROMAX) 500 mg in sodium chloride 0.9 % 250 mL IVPB  Status:  Discontinued        500 mg 250 mL/hr over 60 Minutes Intravenous Every 24 hours 01/14/20 2044 01/15/20 1542             Family Communication/Anticipated D/C date and plan/Code Status   DVT prophylaxis:  apixaban (ELIQUIS) tablet 5 mg     Code Status: Full Code  Family Communication: Plan discussed with Deatra Canter, his son, at the bedside Disposition Plan:    Status is: Inpatient  Remains inpatient appropriate because:IV treatments appropriate due to intensity of illness or inability to take PO and Inpatient level of care appropriate due to  severity of illness   Dispo: The patient is from: Home              Anticipated d/c is to: Home              Anticipated d/c date is: > 3 days              Patient currently is not medically stable to d/c.           Subjective:   Interval events noted.  Patient is confused and unable to provide any  history.  His son, Deatra Canter, is at the bedside.  Objective:    Vitals:   01/15/20 0723 01/15/20 0830 01/15/20 0951 01/15/20 1201  BP:  (!) 128/93 (!) 131/97 116/85  Pulse:  (!) 56 (!) 110 (!) 103  Resp:  19 16 18   Temp: 97.8 F (36.6 C)  98.2 F (36.8 C) 98.1 F (36.7 C)  TempSrc: Axillary  Oral   SpO2:  93% 97% 97%  Weight:      Height:       No data found.  No intake or output data in the 24 hours ending 01/15/20 1543 Filed Weights   01/14/20 1804  Weight: 88 kg    Exam:  GEN: NAD SKIN: No rash EYES: No pallor or icterus ENT: MMM CV: RRR PULM: CTA B ABD: soft, obese, NT, +BS CNS: Alert but confused EXT: B/l leg pitting edema. No tenderness   Data Reviewed:   I have personally reviewed following labs and imaging studies:  Labs: Labs show the following:   Basic Metabolic Panel: Recent Labs  Lab 01/14/20 1829 01/15/20 0437  NA 138 135  K 3.6 4.1  CL 98 99  CO2 29 25  GLUCOSE 108* 116*  BUN 38* 43*  CREATININE 1.33* 1.40*  CALCIUM 8.9 9.0  MG  --  2.3  PHOS  --  5.2*   GFR Estimated Creatinine Clearance: 42.2 mL/min (A) (by C-G formula based on SCr of 1.4 mg/dL (H)). Liver Function Tests: Recent Labs  Lab 01/14/20 1829 01/15/20 0437  AST 30 34  ALT 18 17  ALKPHOS 95 88  BILITOT 0.9 0.7  PROT 8.3* 7.8  ALBUMIN 3.9 3.5   No results for input(s): LIPASE, AMYLASE in the last 168 hours. No results for input(s): AMMONIA in the last 168 hours. Coagulation profile No results for input(s): INR, PROTIME in the last 168 hours.  CBC: Recent Labs  Lab 01/14/20 1829 01/15/20 0437  WBC 6.9 4.4  NEUTROABS 5.6 3.9  HGB 9.8* 9.6*  HCT  31.9* 30.3*  MCV 90.4 88.3  PLT 157 144*   Cardiac Enzymes: No results for input(s): CKTOTAL, CKMB, CKMBINDEX, TROPONINI in the last 168 hours. BNP (last 3 results) No results for input(s): PROBNP in the last 8760 hours. CBG: Recent Labs  Lab 01/14/20 2352 01/15/20 0957 01/15/20 1159  GLUCAP 119* 115* 204*   D-Dimer: No results for input(s): DDIMER in the last 72 hours. Hgb A1c: No results for input(s): HGBA1C in the last 72 hours. Lipid Profile: Recent Labs    01/14/20 1933  TRIG 32   Thyroid function studies: No results for input(s): TSH, T4TOTAL, T3FREE, THYROIDAB in the last 72 hours.  Invalid input(s): FREET3 Anemia work up: Recent Labs    01/14/20 1933 01/15/20 0437  FERRITIN 21* 26   Sepsis Labs: Recent Labs  Lab 01/14/20 1829 01/14/20 1929 01/15/20 0437  PROCALCITON  --  0.41 0.39  WBC 6.9  --  4.4    Microbiology Recent Results (from the past 240 hour(s))  Resp Panel by RT-PCR (Flu A&B, Covid) Nasopharyngeal Swab     Status: Abnormal   Collection Time: 01/14/20  6:41 PM   Specimen: Nasopharyngeal Swab; Nasopharyngeal(NP) swabs in vial transport medium  Result Value Ref Range Status   SARS Coronavirus 2 by RT PCR POSITIVE (A) NEGATIVE Final    Comment: RESULT CALLED TO, READ BACK BY AND VERIFIED WITH: CANDICE KIO @1934  ON 01/14/20 SKL (NOTE) SARS-CoV-2 target nucleic acids are DETECTED.  The SARS-CoV-2 RNA  is generally detectable in upper respiratory specimens during the acute phase of infection. Positive results are indicative of the presence of the identified virus, but do not rule out bacterial infection or co-infection with other pathogens not detected by the test. Clinical correlation with patient history and other diagnostic information is necessary to determine patient infection status. The expected result is Negative.  Fact Sheet for Patients: EntrepreneurPulse.com.au  Fact Sheet for Healthcare  Providers: IncredibleEmployment.be  This test is not yet approved or cleared by the [place] FDA and  has been authorized for detection and/or diagnosis of SARS-CoV-2 by FDA under an Emergency Use Authorization (EUA).  This EUA will remain in effect (meaning this test can be  used) for the duration of  the COVID-19 declaration under Section 564(b)(1) of the Act, 21 U.S.C. section 360bbb-3(b)(1), unless the authorization is terminated or revoked sooner.     Influenza A by PCR NEGATIVE NEGATIVE Final   Influenza B by PCR NEGATIVE NEGATIVE Final    Comment: (NOTE) The Xpert Xpress SARS-CoV-2/FLU/RSV plus assay is intended as an aid in the diagnosis of influenza from Nasopharyngeal swab specimens and should not be used as a sole basis for treatment. Nasal washings and aspirates are unacceptable for Xpert Xpress SARS-CoV-2/FLU/RSV testing.  Fact Sheet for Patients: EntrepreneurPulse.com.au  Fact Sheet for Healthcare Providers: IncredibleEmployment.be  This test is not yet approved or cleared by the [place] FDA and has been authorized for detection and/or diagnosis of SARS-CoV-2 by FDA under an Emergency Use Authorization (EUA). This EUA will remain in effect (meaning this test can be used) for the duration of the COVID-19 declaration under Section 564(b)(1) of the Act, 21 U.S.C. section 360bbb-3(b)(1), unless the authorization is terminated or revoked.  Performed at [facility], [address] [place], [place] [address]   CULTURE, BLOOD (ROUTINE X 2) w Reflex to ID Panel     Status: None (Preliminary result)   Collection Time: 01/14/20  9:37 PM   Specimen: BLOOD  Result Value Ref Range Status   Specimen Description BLOOD BLOOD RIGHT FOREARM  Final   Special Requests   Final    BOTTLES DRAWN AEROBIC AND ANAEROBIC Blood Culture adequate volume   Culture   Final    NO GROWTH < 12 HOURS Performed at  [facility], [address] [place], [place] [address]    Report Status PENDING  Incomplete  CULTURE, BLOOD (ROUTINE X 2) w Reflex to ID Panel     Status: None (Preliminary result)   Collection Time: 01/14/20  9:37 PM   Specimen: BLOOD  Result Value Ref Range Status   Specimen Description BLOOD RIGHT ASSIST CONTROL  Final   Special Requests   Final    BOTTLES DRAWN AEROBIC AND ANAEROBIC Blood Culture adequate volume   Culture   Final    NO GROWTH < 12 HOURS Performed at [facility], [address] [place],  [address]    Report Status PENDING  Incomplete    Procedures and diagnostic studies:  DG Chest 2 View  Result Date: 01/14/2020 CLINICAL DATA:  Cough, difficulty breathing EXAM: CHEST - 2 VIEW COMPARISON:  08/06/2019 FINDINGS: Frontal and lateral enlargement the cardiac silhouette views of the chest demonstrate marked. There is mild central vascular congestion without airspace disease, effusion, or pneumothorax. Chronic elevation of the right hemidiaphragm. IMPRESSION: Enlarged cardiac silhouette with mild central vascular congestion. No acute airspace disease. Electronically Signed   By: Randa Ngo M.D.   On: 01/14/2020  18:19               LOS: 1 day   Amrit Cress  [facility]   Pager on [email or web address]. If 7PM-7AM, please contact night-coverage at [email or web address]     01/15/2020, 3:43 PM

## 2020-01-15 NOTE — Progress Notes (Signed)
Patient HR 135 2.5mg  IV metop administered around 1700. HR sustaining in 130s despite metop. Dr. Mal [name] notified.

## 2020-01-15 NOTE — TOC Initial Note (Signed)
Transition of Care [facility]) - Initial/Assessment Note    Patient Details  Name: Derek Shelton MRN: [id number] Date of Birth: [birth year]  [facility] CM/SW Contact:    Eileen Stanford, LCSW Phone Number: 01/15/2020, 12:37 PM  Clinical Narrative:       Pt lives home with spouse. Pt's son's take pt to his doctor appointments. Pt sees Dr Tressia Miners at [facility]. [facility] [address]. Pt currently not getting any home services but has had [facility] in the past per son, Raye Sorrow. Pt's son Raye Sorrow is a Microbiologist. Pt's son states they need HH this time when pt d/c. Pt's son aware it will be difficult to find agency and does not have a preference. Pt's son states he is upset that MD has not called him for update. Message sent to MD.           Expected Discharge Plan: [place] Barriers to Discharge: Continued Medical Work up   Patient Goals and CMS Choice Patient states their goals for this hospitalization and ongoing recovery are:: for pt to get better   Choice offered to / list presented to : Adult Children  Expected Discharge Plan and Services Expected Discharge Plan: [place] In-house Referral: Clinical Social Work   Post Acute Care Choice: [place] arrangements for the past 2 months: [place]                                      Prior Living Arrangements/Services Living arrangements for the past 2 months: Single Family Home Lives with:: Spouse Patient language and need for interpreter reviewed:: Yes Do you feel safe going back to the place where you live?: Yes      Need for Family Participation in Patient Care: Yes (Comment) Care giver support system in place?: Yes (comment)   Criminal Activity/Legal Involvement Pertinent to Current Situation/Hospitalization: No - Comment as needed  Activities of Daily Living Home Assistive Devices/Equipment: Walker (specify type) ADL Screening (condition at time of  admission) Patient's cognitive ability adequate to safely complete daily activities?: Yes Is the patient deaf or have difficulty hearing?: No Does the patient have difficulty seeing, even when wearing glasses/contacts?: No Does the patient have difficulty concentrating, remembering, or making decisions?: Yes Patient able to express need for assistance with ADLs?: No Does the patient have difficulty dressing or bathing?: Yes Independently performs ADLs?: No Communication: Needs assistance Is this a change from baseline?: Pre-admission baseline Dressing (OT): Needs assistance Is this a change from baseline?: Pre-admission baseline Grooming: Needs assistance Is this a change from baseline?: Pre-admission baseline Feeding: Independent Bathing: Needs assistance Is this a change from baseline?: Pre-admission baseline Toileting: Needs assistance Is this a change from baseline?: Pre-admission baseline In/Out Bed: Needs assistance Is this a change from baseline?: Pre-admission baseline Walks in Home: Independent with device (comment) Does the patient have difficulty walking or climbing stairs?: Yes Weakness of Legs: Both Weakness of Arms/Hands: None  Permission Sought/Granted Permission sought to share information with : Family Supports    Share Information with NAME: badar     Permission granted to share info w Relationship: son     Emotional Assessment Appearance:: Appears stated age Attitude/Demeanor/Rapport: Unable to Assess Affect (typically observed): Unable to Assess   Alcohol / Substance Use: Not Applicable Psych Involvement: No (comment)  Admission diagnosis:  Type 2  diabetes mellitus without complication, with long-term current use of insulin (HCC) [E11.9, Z79.4] Chronic obstructive pulmonary disease, unspecified COPD type ([place]) [J44.9] Chronic congestive heart failure, unspecified heart failure type ([place]) [I50.9] COVID-19 virus infection [U07.1] Acute respiratory  disease due to COVID-19 virus [U07.1, J06.9] Patient Active Problem List   Diagnosis Date Noted   Acute respiratory disease due to COVID-19 virus 01/14/2020   COPD (chronic obstructive pulmonary disease) ([place])    Hypothyroidism    Acute respiratory failure with hypoxia ([place]) 08/06/2019   Acute on chronic diastolic CHF (congestive heart failure) ([place]) 08/06/2019   History of GI bleed 08/06/2019   History of bradycardia 08/06/2019   Moderate tricuspid regurgitation 08/06/2019   PAH (pulmonary artery hypertension) ([place]) 08/06/2019   History of CVA (cerebrovascular accident) 08/06/2019   LLL pneumonia 08/06/2019   HCAP (healthcare-associated pneumonia) 08/06/2019   Acute CHF (congestive heart failure) ([place]) 08/06/2019   Dementia without behavioral disturbance (HCC)    Normocytic anemia 07/06/2019   Hypokalemia 04/02/2019   Proteinuria 03/04/2019   Retention of urine 03/04/2019   Chronic kidney disease, stage 3a ([place]) 03/04/2019   Type II diabetes mellitus with renal manifestations ([place]) 03/04/2019   Chronic kidney disease, stage 2 (mild) 01/04/2019   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   BPH (benign prostatic hyperplasia) 07/18/2018   Degeneration of lumbar intervertebral disc 07/17/2018   Osteoarthritis of knee 07/17/2018   Spinal stenosis of lumbar region 07/17/2018   Acute CVA (cerebrovascular accident) ([place]) 12/02/2017   Acute ischemic stroke ([place]) 69/62/9528   Acute metabolic encephalopathy due to hypoglycemia 11/29/2017   Essential hypertension 11/29/2017   Syncope and collapse 11/28/2017   Atrial fibrillation, chronic ([place]) 05/22/2017   Chest pain 02/21/2017   AKI (acute kidney injury) ([place]) 02/21/2017   SOB (shortness of breath) 02/08/2017   Atrial fibrillation with RVR ([place]) 02/08/2017   Hyperlipidemia 09/23/2014   GI bleeding 08/06/2014   Anemia 08/06/2014   Bradycardia 08/06/2014   Atrial fibrillation ([place])  08/06/2014   Hyponatremia 08/06/2014   Chronic diastolic heart failure ([place]) 08/06/2014   OSA (obstructive sleep apnea) 08/06/2014   Memory loss or impairment 05/29/2014   Current smoker 09/19/2013   Asthma 11/29/2012   Dizziness 11/15/2012   Other dyspnea and respiratory abnormality 11/15/2012   Pain in joint involving lower leg 01/05/2011   Type 2 diabetes mellitus without complication ([place]) 41/32/4401   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   Chronic rhinitis 08/11/2010   Coronary artery disease 08/11/2010   Esophageal reflux 08/11/2010   Personal history of arthritis 08/11/2010   Tear film insufficiency 06/07/2010   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   Keratoconjunctivitis sicca ([place]) 03/10/2010   Lens replaced 03/10/2010   Pterygium 03/10/2010   Gastro-esophageal reflux disease with esophagitis 01/30/2006   PCP:  Gladstone Lighter, MD Pharmacy:   [id number] [address], [place] [address] [place] [address] Phone: [phone] Fax: [phone]     Social Determinants of Health (SDOH) Interventions    Readmission Risk Interventions Readmission Risk Prevention Plan 01/15/2020  Transportation Screening Complete  Medication Review ([place]) Complete  PCP or Specialist appointment within 3-5 days of discharge Complete  HRI or [place] Complete  SW Recovery Care/Counseling Consult Complete  Palliative Care Screening Not Applicable  Skilled Nursing Facility Not Applicable  Some recent data might be hidden

## 2020-01-16 ENCOUNTER — Inpatient Hospital Stay: Payer: Medicare Other

## 2020-01-16 DIAGNOSIS — I5032 Chronic diastolic (congestive) heart failure: Secondary | ICD-10-CM | POA: Diagnosis not present

## 2020-01-16 DIAGNOSIS — U071 COVID-19: Secondary | ICD-10-CM | POA: Diagnosis not present

## 2020-01-16 DIAGNOSIS — I4891 Unspecified atrial fibrillation: Secondary | ICD-10-CM | POA: Diagnosis not present

## 2020-01-16 DIAGNOSIS — N1831 Chronic kidney disease, stage 3a: Secondary | ICD-10-CM | POA: Diagnosis not present

## 2020-01-16 LAB — CBC WITH DIFFERENTIAL/PLATELET
Abs Immature Granulocytes: 0.04 10*3/uL (ref 0.00–0.07)
Basophils Absolute: 0 10*3/uL (ref 0.0–0.1)
Basophils Relative: 0 %
Eosinophils Absolute: 0 10*3/uL (ref 0.0–0.5)
Eosinophils Relative: 0 %
HCT: 30.9 % — ABNORMAL LOW (ref 39.0–52.0)
Hemoglobin: 9.8 g/dL — ABNORMAL LOW (ref 13.0–17.0)
Immature Granulocytes: 1 %
Lymphocytes Relative: 13 %
Lymphs Abs: 0.6 10*3/uL — ABNORMAL LOW (ref 0.7–4.0)
MCH: 27.9 pg (ref 26.0–34.0)
MCHC: 31.7 g/dL (ref 30.0–36.0)
MCV: 88 fL (ref 80.0–100.0)
Monocytes Absolute: 0.5 10*3/uL (ref 0.1–1.0)
Monocytes Relative: 11 %
Neutro Abs: 3.4 10*3/uL (ref 1.7–7.7)
Neutrophils Relative %: 75 %
Platelets: 168 10*3/uL (ref 150–400)
RBC: 3.51 MIL/uL — ABNORMAL LOW (ref 4.22–5.81)
RDW: 15.5 % (ref 11.5–15.5)
WBC: 4.4 10*3/uL (ref 4.0–10.5)
nRBC: 0 % (ref 0.0–0.2)

## 2020-01-16 LAB — COMPREHENSIVE METABOLIC PANEL
ALT: 20 U/L (ref 0–44)
AST: 39 U/L (ref 15–41)
Albumin: 3.5 g/dL (ref 3.5–5.0)
Alkaline Phosphatase: 87 U/L (ref 38–126)
Anion gap: 13 (ref 5–15)
BUN: 61 mg/dL — ABNORMAL HIGH (ref 8–23)
CO2: 26 mmol/L (ref 22–32)
Calcium: 8.7 mg/dL — ABNORMAL LOW (ref 8.9–10.3)
Chloride: 98 mmol/L (ref 98–111)
Creatinine, Ser: 1.78 mg/dL — ABNORMAL HIGH (ref 0.61–1.24)
GFR, Estimated: 38 mL/min — ABNORMAL LOW (ref >60)
Glucose, Bld: 201 mg/dL — ABNORMAL HIGH (ref 70–99)
Potassium: 3.8 mmol/L (ref 3.5–5.1)
Sodium: 137 mmol/L (ref 135–145)
Total Bilirubin: 0.8 mg/dL (ref 0.3–1.2)
Total Protein: 7.6 g/dL (ref 6.5–8.1)

## 2020-01-16 LAB — GLUCOSE, CAPILLARY
Glucose-Capillary: 246 mg/dL — ABNORMAL HIGH (ref 70–99)
Glucose-Capillary: 206 mg/dL — ABNORMAL HIGH (ref 70–99)
Glucose-Capillary: 158 mg/dL — ABNORMAL HIGH (ref 70–99)
Glucose-Capillary: 160 mg/dL — ABNORMAL HIGH (ref 70–99)

## 2020-01-16 LAB — FIBRIN DERIVATIVES D-DIMER (ARMC ONLY): Fibrin derivatives D-dimer (ARMC): 584.59 ng/mL (FEU) — ABNORMAL HIGH (ref 0.00–499.00)

## 2020-01-16 LAB — FERRITIN: Ferritin: 34 ng/mL (ref 24–336)

## 2020-01-16 LAB — BRAIN NATRIURETIC PEPTIDE: B Natriuretic Peptide: 386.5 pg/mL — ABNORMAL HIGH (ref 0.0–100.0)

## 2020-01-16 LAB — C-REACTIVE PROTEIN: CRP: 3.4 mg/dL — ABNORMAL HIGH (ref <1.0)

## 2020-01-16 LAB — PROCALCITONIN: Procalcitonin: 0.46 ng/mL

## 2020-01-16 MED ORDER — FAMOTIDINE IN NACL 20-0.9 MG/50ML-% IV SOLN
20.0000 mg | Freq: Once | INTRAVENOUS | Status: DC | PRN
  Filled 2020-01-16: qty 50

## 2020-01-16 MED ORDER — EPINEPHRINE 0.3 MG/0.3ML IJ SOAJ
0.3000 mg | Freq: Once | INTRAMUSCULAR | Status: DC | PRN
  Filled 2020-01-16: qty 0.3

## 2020-01-16 MED ORDER — DILTIAZEM HCL-DEXTROSE 125-5 MG/125ML-% IV SOLN (PREMIX)
5.0000 mg/h | INTRAVENOUS | Status: DC
  Administered 2020-01-16 – 2020-01-17 (×2): 5 mg/h via INTRAVENOUS
  Administered 2020-01-18: 20:00:00 12.5 mg/h via INTRAVENOUS
  Administered 2020-01-18: 03:00:00 10 mg/h via INTRAVENOUS
  Administered 2020-01-19: 04:00:00 12.5 mg/h via INTRAVENOUS
  Administered 2020-01-19 (×2): 5 mg/h via INTRAVENOUS
  Filled 2020-01-16 (×6): qty 125

## 2020-01-16 MED ORDER — APIXABAN 2.5 MG PO TABS
2.5000 mg | ORAL_TABLET | Freq: Two times a day (BID) | ORAL | Status: DC
  Administered 2020-01-16 – 2020-01-18 (×5): 2.5 mg via ORAL
  Filled 2020-01-16 (×5): qty 1

## 2020-01-16 MED ORDER — SODIUM CHLORIDE 0.9 % IV SOLN
INTRAVENOUS | Status: DC | PRN
  Administered 2020-01-18: 09:00:00 250 mL via INTRAVENOUS

## 2020-01-16 MED ORDER — DIPHENHYDRAMINE HCL 50 MG/ML IJ SOLN: 50.0000 mg | Freq: Once | INTRAMUSCULAR | Status: DC | PRN

## 2020-01-16 MED ORDER — ALBUTEROL SULFATE HFA 108 (90 BASE) MCG/ACT IN AERS
2.0000 | INHALATION_SPRAY | Freq: Once | RESPIRATORY_TRACT | Status: DC | PRN
  Filled 2020-01-16: qty 6.7

## 2020-01-16 MED ORDER — METHYLPREDNISOLONE SODIUM SUCC 125 MG IJ SOLR: 125.0000 mg | Freq: Once | INTRAMUSCULAR | Status: DC | PRN

## 2020-01-16 MED ORDER — ALBUTEROL SULFATE HFA 108 (90 BASE) MCG/ACT IN AERS: 2.0000 | INHALATION_SPRAY | Freq: Four times a day (QID) | RESPIRATORY_TRACT | Status: DC

## 2020-01-16 MED ORDER — SODIUM CHLORIDE 0.9 % IV SOLN
Freq: Once | INTRAVENOUS | Status: AC
  Filled 2020-01-16: qty 20

## 2020-01-16 MED ORDER — ALBUTEROL SULFATE HFA 108 (90 BASE) MCG/ACT IN AERS: 2.0000 | INHALATION_SPRAY | Freq: Four times a day (QID) | RESPIRATORY_TRACT | Status: DC | PRN

## 2020-01-16 MED ORDER — LACTATED RINGERS IV SOLN: INTRAVENOUS | Status: AC

## 2020-01-16 NOTE — Progress Notes (Addendum)
Progress Note    Derek Shelton  [id number] DOB: 1940-11-04  DOA: 01/14/2020 PCP: Gladstone Lighter, MD      Brief Narrative:    Medical records reviewed and are as summarized below:  Derek Shelton is a 79 y.o. male with medical history significant for chronic diastolic CHF, pulmonary arterial hypertension, history of CVA, diabetes, atrial fibrillation on Eliquis, chronic kidney disease stage IIIa, chronic anemia, dementia, hypothyroidism, and CAD, now presenting to the emergency department with 3 days of progressive cough, shortness of breath, fevers, lethargy, and loss of appetite.  Household contacts have tested positive for COVID-19.       Assessment/Plan:   Principal Problem:   Acute respiratory disease due to COVID-19 virus Active Problems:   Chronic diastolic heart failure (HCC)   Atrial fibrillation with RVR (HCC)   Coronary artery disease   Essential hypertension   Normocytic anemia   Dementia without behavioral disturbance (HCC)   History of CVA (cerebrovascular accident)   Chronic kidney disease, stage 3a ([place])   Type II diabetes mellitus with renal manifestations (HCC)   COPD (chronic obstructive pulmonary disease) (HCC)   Hypothyroidism   Body mass index is 32.33 kg/m.  (Obesity)    COVID-19 with acute respiratory disease and likely underlying pneumonia;   Patient is not hypoxic.  There is no documented hypoxia in the hospital.  Dr. Linus Mako, his son, said that hypoxia from home was likely from his obstructive sleep apnea.  He said patient is nonadherent to CPAP and sometimes he becomes hypoxic from not wearing his CPAP. There is no indication for steroids at this time.  Dr. Linus Mako (his son Ebony Hail intensivist) said he preferred that patient (his dad) be given monoclonal antibody infusion because he thinks he is likely to benefit from the monoclonal antibody infusion rather than IV remdesivir.  Patient will be given bamlanivimab and  etesevimab infusion.  Discontinue IV remdesivir for now.   Atrial fibrillation with RVR  He was given IV metoprolol bolus and IV Cardizem bolus rapid A. fib yesterday. Heart rate is still uncontrolled.  Heart rate was in the 140s at some point.   Start IV Cardizem infusion. Continue metoprolol and Eliquis. - CHADS-VASc is 11 (age x2, CVA x2, DM, HTN, CHF)  COPD  Stable.  Continue bronchodilators  Chronic diastolic CHF; PAH   Hold torsemide.  Continue sildenafil.   AKI on CKD IIIa  Creatinine has gone from 1.4-1.78.  Hold torsemide and hydrate gently with Ringer's lactate at 50 cc/h for 12 hours.  Monitor BMP.  Insulin-dependent DM  - A1c was 6.1% in October 2021  Continue Lantus and NovoLog as needed for hyperglycemia.    CAD; elevated troponin  Mildly elevated troponin is likely due to demand ischemia. Continue metoprolol and Eliquis.  Hypothyroidism   Continue Synthroid    Dementia  Continue supportive care  History of stroke   Plan of care was discussed with his sons Deatra Canter at the bedside and Dr. Linus Mako over the phone)      Diet Order            Diet heart healthy/carb modified Room service appropriate? Yes; Fluid consistency: Thin  Diet effective now                    Consultants:  None  Procedures:  None    Medications:   . apixaban  2.5 mg Oral BID  . atorvastatin  40 mg Oral q1800  .  donepezil  5 mg Oral QHS  . DULoxetine  60 mg Oral Daily  . ferrous sulfate  325 mg Oral Daily  . finasteride  5 mg Oral Daily  . insulin aspart  0-5 Units Subcutaneous QHS  . insulin aspart  0-9 Units Subcutaneous TID WC  . insulin glargine  10 Units Subcutaneous QHS  . Ipratropium-Albuterol  1 puff Inhalation Q6H  . levothyroxine  112 mcg Oral Q0600  . linagliptin  5 mg Oral Daily  . metoprolol tartrate  25 mg Oral BID  . mometasone-formoterol  2 puff Inhalation BID  . montelukast  10 mg Oral QHS  . pantoprazole  40 mg Oral Daily  .  potassium chloride SA  20 mEq Oral Daily  . sildenafil  20 mg Oral TID  . tamsulosin  0.4 mg Oral Daily  . tiotropium  18 mcg Inhalation Daily   Continuous Infusions: . sodium chloride 250 mL (01/15/20 1020)  . sodium chloride    . diltiazem (CARDIZEM) infusion    . famotidine (PEPCID) IV    . lactated ringers 50 mL/hr at 01/16/20 0856     Anti-infectives (From admission, onward)   Start     Dose/Rate Route Frequency Ordered Stop   01/15/20 1000  remdesivir 100 mg in sodium chloride 0.9 % 100 mL IVPB  Status:  Discontinued       "Followed by" Linked Group Details   100 mg 200 mL/hr over 30 Minutes Intravenous Daily 01/14/20 2101 01/16/20 0807   01/14/20 2200  remdesivir 200 mg in sodium chloride 0.9% 250 mL IVPB       "Followed by" Linked Group Details   200 mg 580 mL/hr over 30 Minutes Intravenous Once 01/14/20 2101 01/15/20 0106   01/14/20 2045  cefTRIAXone (ROCEPHIN) 2 g in sodium chloride 0.9 % 100 mL IVPB  Status:  Discontinued        2 g 200 mL/hr over 30 Minutes Intravenous Every 24 hours 01/14/20 2044 01/15/20 1542   01/14/20 2045  azithromycin (ZITHROMAX) 500 mg in sodium chloride 0.9 % 250 mL IVPB  Status:  Discontinued        500 mg 250 mL/hr over 60 Minutes Intravenous Every 24 hours 01/14/20 2044 01/15/20 1542             Family Communication/Anticipated D/C date and plan/Code Status   DVT prophylaxis:  apixaban (ELIQUIS) tablet 2.5 mg     Code Status: Full Code  Family Communication: Plan discussed with Karie Mainlandli, his son, at the bedside.  Plan was also discussed with his other son, Dr. Rubin PayorSiddiqui Disposition Plan:    Status is: Inpatient  Remains inpatient appropriate because:IV treatments appropriate due to intensity of illness or inability to take PO and Inpatient level of care appropriate due to severity of illness   Dispo: The patient is from: Home              Anticipated d/c is to: Home              Anticipated d/c date is: 1 day               Patient currently is not medically stable to d/c.           Subjective:   Interval events noted.   He has no complaints.  No shortness of breath or chest pain. His son, Karie Mainlandli, is at the bedside.  Objective:    Vitals:   01/16/20 0823 01/16/20 1217 01/16/20 1257 01/16/20 1456  BP: 107/82 107/67 123/75 109/78  Pulse: 100 65 79 (!) 58  Resp: 18 18 18    Temp: 97.9 F (36.6 C) 98.1 F (36.7 C) 98.4 F (36.9 C) 97.6 F (36.4 C)  TempSrc: Oral Oral Oral Oral  SpO2: 97%  99% 99%  Weight:      Height:       No data found.   Intake/Output Summary (Last 24 hours) at 01/16/2020 1546 Last data filed at 01/16/2020 1418 Gross per 24 hour  Intake 697.35 ml  Output 1952 ml  Net -1254.65 ml   Filed Weights   01/14/20 1804 01/16/20 0321  Weight: 88 kg 88.1 kg    Exam:  GEN: NAD SKIN: Warm and dry EYES: No pallor or icterus ENT: MMM CV: RRR PULM: Bilateral expiratory wheezing.  No rales heard. ABD: soft, obese, NT, +BS CNS: AAO x 3, non focal EXT: Bilateral leg edema has improved.      Data Reviewed:   I have personally reviewed following labs and imaging studies:  Labs: Labs show the following:   Basic Metabolic Panel: Recent Labs  Lab 01/14/20 1829 01/15/20 0437 01/16/20 0403  NA 138 135 137  K 3.6 4.1 3.8  CL 98 99 98  CO2 29 25 26   GLUCOSE 108* 116* 201*  BUN 38* 43* 61*  CREATININE 1.33* 1.40* 1.78*  CALCIUM 8.9 9.0 8.7*  MG  --  2.3  --   PHOS  --  5.2*  --    GFR Estimated Creatinine Clearance: 33.2 mL/min (A) (by C-G formula based on SCr of 1.78 mg/dL (H)). Liver Function Tests: Recent Labs  Lab 01/14/20 1829 01/15/20 0437 01/16/20 0403  AST 30 34 39  ALT 18 17 20   ALKPHOS 95 88 87  BILITOT 0.9 0.7 0.8  PROT 8.3* 7.8 7.6  ALBUMIN 3.9 3.5 3.5   No results for input(s): LIPASE, AMYLASE in the last 168 hours. No results for input(s): AMMONIA in the last 168 hours. Coagulation profile No results for input(s): INR, PROTIME in the  last 168 hours.  CBC: Recent Labs  Lab 01/14/20 1829 01/15/20 0437 01/16/20 0403  WBC 6.9 4.4 4.4  NEUTROABS 5.6 3.9 3.4  HGB 9.8* 9.6* 9.8*  HCT 31.9* 30.3* 30.9*  MCV 90.4 88.3 88.0  PLT 157 144* 168   Cardiac Enzymes: No results for input(s): CKTOTAL, CKMB, CKMBINDEX, TROPONINI in the last 168 hours. BNP (last 3 results) No results for input(s): PROBNP in the last 8760 hours. CBG: Recent Labs  Lab 01/15/20 1654 01/15/20 2005 01/15/20 2159 01/16/20 0820 01/16/20 1214  GLUCAP 209* 207* 199* 246* 206*   D-Dimer: No results for input(s): DDIMER in the last 72 hours. Hgb A1c: No results for input(s): HGBA1C in the last 72 hours. Lipid Profile: Recent Labs    01/14/20 1933  TRIG 32   Thyroid function studies: No results for input(s): TSH, T4TOTAL, T3FREE, THYROIDAB in the last 72 hours.  Invalid input(s): FREET3 Anemia work up: Recent Labs    01/15/20 0437 01/16/20 0403  FERRITIN 26 34   Sepsis Labs: Recent Labs  Lab 01/14/20 1829 01/14/20 1929 01/15/20 0437 01/16/20 0403  PROCALCITON  --  0.41 0.39 0.46  WBC 6.9  --  4.4 4.4    Microbiology Recent Results (from the past 240 hour(s))  Resp Panel by RT-PCR (Flu A&B, Covid) Nasopharyngeal Swab     Status: Abnormal   Collection Time: 01/14/20  6:41 PM   Specimen: Nasopharyngeal Swab; Nasopharyngeal(NP) swabs in vial  transport medium  Result Value Ref Range Status   SARS Coronavirus 2 by RT PCR POSITIVE (A) NEGATIVE Final    Comment: RESULT CALLED TO, READ BACK BY AND VERIFIED WITH: CANDICE KIO @1934  ON 01/14/20 SKL (NOTE) SARS-CoV-2 target nucleic acids are DETECTED.  The SARS-CoV-2 RNA is generally detectable in upper respiratory specimens during the acute phase of infection. Positive results are indicative of the presence of the identified virus, but do not rule out bacterial infection or co-infection with other pathogens not detected by the test. Clinical correlation with patient history  and other diagnostic information is necessary to determine patient infection status. The expected result is Negative.  Fact Sheet for Patients: EntrepreneurPulse.com.au  Fact Sheet for Healthcare Providers: IncredibleEmployment.be  This test is not yet approved or cleared by the [place] FDA and  has been authorized for detection and/or diagnosis of SARS-CoV-2 by FDA under an Emergency Use Authorization (EUA).  This EUA will remain in effect (meaning this test can be  used) for the duration of  the COVID-19 declaration under Section 564(b)(1) of the Act, 21 U.S.C. section 360bbb-3(b)(1), unless the authorization is terminated or revoked sooner.     Influenza A by PCR NEGATIVE NEGATIVE Final   Influenza B by PCR NEGATIVE NEGATIVE Final    Comment: (NOTE) The Xpert Xpress SARS-CoV-2/FLU/RSV plus assay is intended as an aid in the diagnosis of influenza from Nasopharyngeal swab specimens and should not be used as a sole basis for treatment. Nasal washings and aspirates are unacceptable for Xpert Xpress SARS-CoV-2/FLU/RSV testing.  Fact Sheet for Patients: EntrepreneurPulse.com.au  Fact Sheet for Healthcare Providers: IncredibleEmployment.be  This test is not yet approved or cleared by the [place] FDA and has been authorized for detection and/or diagnosis of SARS-CoV-2 by FDA under an Emergency Use Authorization (EUA). This EUA will remain in effect (meaning this test can be used) for the duration of the COVID-19 declaration under Section 564(b)(1) of the Act, 21 U.S.C. section 360bbb-3(b)(1), unless the authorization is terminated or revoked.  Performed at [facility], [address] [place], [place] [address]   CULTURE, BLOOD (ROUTINE X 2) w Reflex to ID Panel     Status: None (Preliminary result)   Collection Time: 01/14/20  9:37 PM   Specimen: BLOOD  Result Value Ref Range  Status   Specimen Description BLOOD BLOOD RIGHT FOREARM  Final   Special Requests   Final    BOTTLES DRAWN AEROBIC AND ANAEROBIC Blood Culture adequate volume   Culture   Final    NO GROWTH 2 DAYS Performed at [facility], [address] [place], [place] [address]    Report Status PENDING  Incomplete  CULTURE, BLOOD (ROUTINE X 2) w Reflex to ID Panel     Status: None (Preliminary result)   Collection Time: 01/14/20  9:37 PM   Specimen: BLOOD  Result Value Ref Range Status   Specimen Description BLOOD RIGHT ASSIST CONTROL  Final   Special Requests   Final    BOTTLES DRAWN AEROBIC AND ANAEROBIC Blood Culture adequate volume   Culture   Final    NO GROWTH 2 DAYS Performed at [facility], [address] [place],  [address]    Report Status PENDING  Incomplete    Procedures and diagnostic studies:  DG Chest 2 View  Result Date: 01/14/2020 CLINICAL DATA:  Cough, difficulty breathing EXAM: CHEST - 2 VIEW COMPARISON:  08/06/2019 FINDINGS: Frontal and lateral enlargement the cardiac silhouette views of  the chest demonstrate marked. There is mild central vascular congestion without airspace disease, effusion, or pneumothorax. Chronic elevation of the right hemidiaphragm. IMPRESSION: Enlarged cardiac silhouette with mild central vascular congestion. No acute airspace disease. Electronically Signed   By: Randa Ngo M.D.   On: 01/14/2020 18:19   DG Chest Port 1 View  Result Date: 01/16/2020 CLINICAL DATA:  Pneumonia EXAM: PORTABLE CHEST 1 VIEW COMPARISON:  Two days ago FINDINGS: Mild patchy haziness over both lungs. Cardiopericardial enlargement and vascular pedicle widening. No visible effusion or pneumothorax. IMPRESSION: 1. Hazy pulmonary opacity correlating with history of pneumonia. 2. Cardiomegaly. Electronically Signed   By: Monte Fantasia M.D.   On: 01/16/2020 05:58               LOS: 2 days   Bo Rogue  [facility]   Pager  on [email or web address]. If 7PM-7AM, please contact night-coverage at [email or web address]     01/16/2020, 3:46 PM

## 2020-01-17 DIAGNOSIS — I4891 Unspecified atrial fibrillation: Secondary | ICD-10-CM | POA: Diagnosis not present

## 2020-01-17 DIAGNOSIS — I5032 Chronic diastolic (congestive) heart failure: Secondary | ICD-10-CM | POA: Diagnosis not present

## 2020-01-17 DIAGNOSIS — Z8673 Personal history of transient ischemic attack (TIA), and cerebral infarction without residual deficits: Secondary | ICD-10-CM

## 2020-01-17 DIAGNOSIS — N179 Acute kidney failure, unspecified: Secondary | ICD-10-CM

## 2020-01-17 DIAGNOSIS — U071 COVID-19: Secondary | ICD-10-CM | POA: Diagnosis not present

## 2020-01-17 LAB — GLUCOSE, CAPILLARY
Glucose-Capillary: 108 mg/dL — ABNORMAL HIGH (ref 70–99)
Glucose-Capillary: 124 mg/dL — ABNORMAL HIGH (ref 70–99)
Glucose-Capillary: 204 mg/dL — ABNORMAL HIGH (ref 70–99)
Glucose-Capillary: 251 mg/dL — ABNORMAL HIGH (ref 70–99)

## 2020-01-17 LAB — COMPREHENSIVE METABOLIC PANEL
ALT: 19 U/L (ref 0–44)
AST: 32 U/L (ref 15–41)
Albumin: 3.1 g/dL — ABNORMAL LOW (ref 3.5–5.0)
Alkaline Phosphatase: 69 U/L (ref 38–126)
Anion gap: 9 (ref 5–15)
BUN: 77 mg/dL — ABNORMAL HIGH (ref 8–23)
CO2: 26 mmol/L (ref 22–32)
Calcium: 8.2 mg/dL — ABNORMAL LOW (ref 8.9–10.3)
Chloride: 97 mmol/L — ABNORMAL LOW (ref 98–111)
Creatinine, Ser: 1.75 mg/dL — ABNORMAL HIGH (ref 0.61–1.24)
GFR, Estimated: 39 mL/min — ABNORMAL LOW (ref >60)
Glucose, Bld: 151 mg/dL — ABNORMAL HIGH (ref 70–99)
Potassium: 4 mmol/L (ref 3.5–5.1)
Sodium: 132 mmol/L — ABNORMAL LOW (ref 135–145)
Total Bilirubin: 0.7 mg/dL (ref 0.3–1.2)
Total Protein: 6.8 g/dL (ref 6.5–8.1)

## 2020-01-17 LAB — CBC WITH DIFFERENTIAL/PLATELET
Abs Immature Granulocytes: 0.06 10*3/uL (ref 0.00–0.07)
Basophils Absolute: 0 10*3/uL (ref 0.0–0.1)
Basophils Relative: 0 %
Eosinophils Absolute: 0 10*3/uL (ref 0.0–0.5)
Eosinophils Relative: 0 %
HCT: 27.3 % — ABNORMAL LOW (ref 39.0–52.0)
Hemoglobin: 8.6 g/dL — ABNORMAL LOW (ref 13.0–17.0)
Immature Granulocytes: 1 %
Lymphocytes Relative: 16 %
Lymphs Abs: 1.1 10*3/uL (ref 0.7–4.0)
MCH: 27.7 pg (ref 26.0–34.0)
MCHC: 31.5 g/dL (ref 30.0–36.0)
MCV: 88.1 fL (ref 80.0–100.0)
Monocytes Absolute: 0.8 10*3/uL (ref 0.1–1.0)
Monocytes Relative: 12 %
Neutro Abs: 5 10*3/uL (ref 1.7–7.7)
Neutrophils Relative %: 71 %
Platelets: 162 10*3/uL (ref 150–400)
RBC: 3.1 MIL/uL — ABNORMAL LOW (ref 4.22–5.81)
RDW: 15.4 % (ref 11.5–15.5)
WBC: 7 10*3/uL (ref 4.0–10.5)
nRBC: 0 % (ref 0.0–0.2)

## 2020-01-17 LAB — C-REACTIVE PROTEIN: CRP: 1.5 mg/dL — ABNORMAL HIGH (ref <1.0)

## 2020-01-17 LAB — FERRITIN: Ferritin: 29 ng/mL (ref 24–336)

## 2020-01-17 LAB — FIBRIN DERIVATIVES D-DIMER (ARMC ONLY): Fibrin derivatives D-dimer (ARMC): 420.69 ng/mL (FEU) (ref 0.00–499.00)

## 2020-01-17 MED ORDER — LACTATED RINGERS IV SOLN: INTRAVENOUS | Status: DC

## 2020-01-17 MED ORDER — SODIUM CHLORIDE 0.9 % IV SOLN
100.0000 mg | Freq: Every day | INTRAVENOUS | Status: AC
  Administered 2020-01-17 – 2020-01-19 (×3): 100 mg via INTRAVENOUS
  Filled 2020-01-17: qty 20
  Filled 2020-01-17: qty 100
  Filled 2020-01-17: qty 20

## 2020-01-17 MED ORDER — PREDNISONE 20 MG PO TABS
40.0000 mg | ORAL_TABLET | Freq: Every day | ORAL | Status: DC
  Administered 2020-01-17 – 2020-01-19 (×3): 40 mg via ORAL
  Filled 2020-01-17 (×3): qty 2

## 2020-01-17 NOTE — TOC Progression Note (Signed)
Transition of Care [facility]) - Progression Note    Patient Details  Name: AYRON FILLINGER MRN: [id number] Date of Birth: 1940-02-13  [facility] of [facility]) CM/SW Contact  Anselm Pancoast, RN Phone Number: 01/17/2020, 12:15 PM  Clinical Narrative:    Spoke to son regarding Important Message. Son verbalized understanding and reports concerns regarding catheter leaking last night and patient having incontinence of bowels. Son states his father is very private and his mother typically provides all the care he needs at home.    Expected Discharge Plan: [place] to Discharge: Continued Medical Work up  Expected Discharge Plan and Services Expected Discharge Plan: [place] In-house Referral: Clinical Social Work   Post Acute Care Choice: [place] arrangements for the past 2 months: [place] Determinants of Health (SDOH) Interventions    Readmission Risk Interventions Readmission Risk Prevention Plan 01/15/2020  Transportation Screening Complete  Medication Review Press photographer) Complete  PCP or Specialist appointment within 3-5 days of discharge Complete  HRI or [place] Complete  SW Recovery Care/Counseling Consult Complete  [place] Not Applicable  Some recent data might be hidden

## 2020-01-17 NOTE — Consult Note (Signed)
Pulmonary Medicine          Date: 01/17/2020,   MRN# [id number] Derek Shelton 1940/03/20     AdmissionWeight: 88 kg                 CurrentWeight: 91.6 kg   Referring physician: Dr Mal [name]   CHIEF COMPLAINT:   Acute on chronic hypoxemic respiratory failure due to [name] is a 79 y.o. male with medical history significant for chronic diastolic CHF, pulmonary arterial hypertension, history of CVA, diabetes, atrial fibrillation on Eliquis, chronic kidney disease stage IIIa, chronic anemia, dementia, hypothyroidism, and CAD, now presenting to the emergency department with 3 days of progressive cough, shortness of breath, fevers, lethargy, and loss of appetite.  Household contacts have tested positive for COVID-19.  Patient has not had any chest pain, abdominal pain, diarrhea, or vomiting.  No recent leg swelling or tenderness.  No hemoptysis.  Patient's son at the bedside assists with the history and notes that the patient has been monitoring her O2 saturations at home and has been dropping into the mid 80s with minimal exertion.  Upon arrival to the ED, patient is found to be febrile to 38.1 C, saturating mid 90s while at rest, mildly tachypneic, tachycardic as high as 150 in atrial fibrillation, and with blood pressure 118/90.  EKG features atrial fibrillation with RVR, rate 128.  Chest x-ray notable for cardiomegaly and mild vascular congestion.  Chemistry panel with creatinine 1.33.  CBC notable for hemoglobin of 9.8, similar to last month.  High-sensitivity troponin is slightly elevated and BNP is 300.  Blood cultures were ordered from the emergency department and the patient was started on remdesivir, Solu-Medrol, Rocephin, and azithromycin. Due to patient complex and comorbid status family requested pulmonary consultation for additional input.     PAST MEDICAL HISTORY   Past Medical History:  Diagnosis Date  . Anemia   .  Asthma   . CHF (congestive heart failure) ([place])   . COPD (chronic obstructive pulmonary disease) ([place])   . Coronary artery disease   . Dementia ([place])    Per son's report  . Diabetes mellitus without complication ([place])   . Dysrhythmia    Atrial Fibrillation  . GERD (gastroesophageal reflux disease)   . GI bleed   . Hyperlipidemia   . Hypertension   . Hypothyroidism   . Shortness of breath dyspnea   . Sleep apnea   . Stroke ([place])   . Thyroid disease      SURGICAL HISTORY   Past Surgical History:  Procedure Laterality Date  . CARDIAC CATHETERIZATION    . COLONOSCOPY N/A 08/10/2014   Procedure: COLONOSCOPY;  Surgeon: Manya Silvas, MD;  Location: [facility] ENDOSCOPY;  Service: Endoscopy;  Laterality: N/A;  . COLONOSCOPY WITH PROPOFOL N/A 04/05/2016   Procedure: COLONOSCOPY WITH PROPOFOL;  Surgeon: Lollie Sails, MD;  Location: [facility] ENDOSCOPY;  Service: Endoscopy;  Laterality: N/A;  . COLONOSCOPY WITH PROPOFOL N/A 07/10/2019   Procedure: COLONOSCOPY WITH PROPOFOL;  Surgeon: Lucilla Lame, MD;  Location: [facility] ENDOSCOPY;  Service: Endoscopy;  Laterality: N/A;  . ESOPHAGOGASTRODUODENOSCOPY N/A 08/08/2014   Procedure: ESOPHAGOGASTRODUODENOSCOPY (EGD);  Surgeon: Manya Silvas, MD;  Location: [facility] ENDOSCOPY;  Service: Endoscopy;  Laterality: N/A;  plan for early afternoon  . ESOPHAGOGASTRODUODENOSCOPY (EGD) WITH PROPOFOL N/A 07/10/2019   Procedure: ESOPHAGOGASTRODUODENOSCOPY (EGD) WITH PROPOFOL;  Surgeon: Lucilla Lame, MD;  Location: [facility] ENDOSCOPY;  Service: Endoscopy;  Laterality: N/A;  . EYE SURGERY    . GIVENS CAPSULE STUDY N/A 08/12/2014   Procedure: GIVENS CAPSULE STUDY;  Surgeon: Manya Silvas, MD;  Location: [facility] ENDOSCOPY;  Service: Endoscopy;  Laterality: N/A;  . rectal fistula N/A      FAMILY HISTORY   Family History  Problem Relation Age of Onset  . Diabetes Mother      SOCIAL HISTORY   Social History   Tobacco Use  . Smoking status: Former Smoker    Quit  date: 2000    Years since quitting: 21.9  . Smokeless tobacco: Former Systems developer    Types: Secondary school teacher  . Vaping Use: Never used  Substance Use Topics  . Alcohol use: No  . Drug use: No     MEDICATIONS    Home Medication:    Current Medication:  Current Facility-Administered Medications:  .  0.9 %  sodium chloride infusion, 250 mL, Intravenous, PRN, Opyd, Ilene Qua, MD, Last Rate: 10 mL/hr at 01/15/20 1020, 250 mL at 01/15/20 1020 .  0.9 %  sodium chloride infusion, , Intravenous, PRN, Jennye Boroughs, MD .  acetaminophen (TYLENOL) tablet 650 mg, 650 mg, Oral, Q6H PRN, Opyd, Ilene Qua, MD, 650 mg at 01/16/20 2120 .  apixaban (ELIQUIS) tablet 2.5 mg, 2.5 mg, Oral, BID, Benn Moulder, RPH, 2.5 mg at 01/17/20 0844 .  atorvastatin (LIPITOR) tablet 40 mg, 40 mg, Oral, q1800, Jennye Boroughs, MD, 40 mg at 01/16/20 2109 .  chlorpheniramine-HYDROcodone (TUSSIONEX) 10-8 MG/5ML suspension 5 mL, 5 mL, Oral, Q12H PRN, Opyd, Ilene Qua, MD .  diltiazem (CARDIZEM) 125 mg in dextrose 5% 125 mL (1 mg/mL) infusion, 5-15 mg/hr, Intravenous, Titrated, Jennye Boroughs, MD, Last Rate: 5 mL/hr at 01/17/20 0854, 5 mg/hr at 01/17/20 0854 .  diphenhydrAMINE (BENADRYL) injection 50 mg, 50 mg, Intravenous, Once PRN, Jennye Boroughs, MD .  donepezil (ARICEPT) tablet 5 mg, 5 mg, Oral, QHS, Jennye Boroughs, MD, 5 mg at 01/16/20 2108 .  DULoxetine (CYMBALTA) DR capsule 60 mg, 60 mg, Oral, Daily, Jennye Boroughs, MD, 60 mg at 01/17/20 0842 .  EPINEPHrine (EPI-PEN) injection 0.3 mg, 0.3 mg, Intramuscular, Once PRN, Jennye Boroughs, MD .  famotidine (PEPCID) IVPB 20 mg premix, 20 mg, Intravenous, Once PRN, Jennye Boroughs, MD .  ferrous sulfate tablet 325 mg, 325 mg, Oral, Daily, Jennye Boroughs, MD, 325 mg at 01/17/20 0843 .  finasteride (PROSCAR) tablet 5 mg, 5 mg, Oral, Daily, Jennye Boroughs, MD, 5 mg at 01/17/20 0842 .  guaiFENesin-dextromethorphan (ROBITUSSIN DM) 100-10 MG/5ML syrup 10 mL, 10 mL, Oral, Q4H PRN,  Opyd, Ilene Qua, MD, 10 mL at 01/14/20 2225 .  insulin aspart (novoLOG) injection 0-5 Units, 0-5 Units, Subcutaneous, QHS, Opyd, Timothy S, MD .  insulin aspart (novoLOG) injection 0-9 Units, 0-9 Units, Subcutaneous, TID WC, Opyd, Ilene Qua, MD, 1 Units at 01/17/20 1251 .  insulin glargine (LANTUS) injection 10 Units, 10 Units, Subcutaneous, QHS, Jennye Boroughs, MD, 10 Units at 01/16/20 2110 .  Ipratropium-Albuterol (COMBIVENT) respimat 1 puff, 1 puff, Inhalation, Q6H, Opyd, Ilene Qua, MD, 1 puff at 01/17/20 1516 .  lactated ringers infusion, , Intravenous, Continuous, Jennye Boroughs, MD, Last Rate: 50 mL/hr at 01/17/20 1252, New Bag at 01/17/20 1252 .  levothyroxine (SYNTHROID) tablet 112 mcg, 112 mcg, Oral, Q0600, Jennye Boroughs, MD, 112 mcg at 01/17/20 [phone] .  linagliptin (TRADJENTA) tablet 5 mg, 5 mg, Oral, Daily, Opyd, Ilene Qua, MD, 5 mg at 01/17/20 0844 .  metoprolol tartrate (LOPRESSOR) injection 2.5  mg, 2.5 mg, Intravenous, Q2H PRN, Opyd, Ilene Qua, MD, 2.5 mg at 01/15/20 1705 .  metoprolol tartrate (LOPRESSOR) tablet 25 mg, 25 mg, Oral, BID, Opyd, Ilene Qua, MD, 25 mg at 01/17/20 0844 .  mometasone-formoterol (DULERA) 200-5 MCG/ACT inhaler 2 puff, 2 puff, Inhalation, BID, Opyd, Ilene Qua, MD, 2 puff at 01/17/20 0845 .  montelukast (SINGULAIR) tablet 10 mg, 10 mg, Oral, QHS, Opyd, Ilene Qua, MD, 10 mg at 01/16/20 2111 .  ondansetron (ZOFRAN) tablet 4 mg, 4 mg, Oral, Q6H PRN **OR** ondansetron (ZOFRAN) injection 4 mg, 4 mg, Intravenous, Q6H PRN, Opyd, Timothy S, MD .  pantoprazole (PROTONIX) EC tablet 40 mg, 40 mg, Oral, Daily, Opyd, Ilene Qua, MD, 40 mg at 01/17/20 0845 .  potassium chloride SA (KLOR-CON) CR tablet 20 mEq, 20 mEq, Oral, Daily, Jennye Boroughs, MD, 20 mEq at 01/17/20 0843 .  predniSONE (DELTASONE) tablet 40 mg, 40 mg, Oral, Daily, Jennye Boroughs, MD, 40 mg at 01/17/20 1255 .  remdesivir 100 mg in sodium chloride 0.9 % 100 mL IVPB, 100 mg, Intravenous, Daily, Jennye Boroughs,  MD, Last Rate: 200 mL/hr at 01/17/20 1256, 100 mg at 01/17/20 1256 .  sildenafil (REVATIO) tablet 20 mg, 20 mg, Oral, TID, Jennye Boroughs, MD, 20 mg at 01/17/20 0843 .  tamsulosin (FLOMAX) capsule 0.4 mg, 0.4 mg, Oral, Daily, Jennye Boroughs, MD, 0.4 mg at 01/17/20 0843 .  tiotropium (SPIRIVA) inhalation capsule ([facility] use ONLY) 18 mcg, 18 mcg, Inhalation, Daily, Opyd, Ilene Qua, MD, 18 mcg at 01/17/20 0846    ALLERGIES   Carvedilol, Penicillins, Aspirin, Aricept [donepezil hcl], and Catapres [clonidine hcl]     REVIEW OF SYSTEMS    Review of Systems:  Gen:  Denies  fever, sweats, chills weigh loss  HEENT: Denies blurred vision, double vision, ear pain, eye pain, hearing loss, nose bleeds, sore throat Cardiac:  No dizziness, chest pain or heaviness, chest tightness,edema Resp:   Denies cough or sputum porduction, shortness of breath,wheezing, hemoptysis,  Gi: Denies swallowing difficulty, stomach pain, nausea or vomiting, diarrhea, constipation, bowel incontinence Gu:  Denies bladder incontinence, burning urine Ext:   Denies Joint pain, stiffness or swelling Skin: Denies  skin rash, easy bruising or bleeding or hives Endoc:  Denies polyuria, polydipsia , polyphagia or weight change Psych:   Denies depression, insomnia or hallucinations   Other:  All other systems negative   VS: BP 106/75 (BP Location: Left Arm)   Pulse (!) 120   Temp 98.7 F (37.1 C) (Oral)   Resp 20   Ht 5\' 5"  (1.651 m)   Wt 91.6 kg   SpO2 98%   BMI 33.61 kg/m      PHYSICAL EXAM    GENERAL:NAD, no fevers, chills, no weakness no fatigue HEAD: Normocephalic, atraumatic.  EYES: Pupils equal, round, reactive to light. Extraocular muscles intact. No scleral icterus.  MOUTH: Moist mucosal membrane. Dentition intact. No abscess noted.  EAR, NOSE, THROAT: Clear without exudates. No external lesions.  NECK: Supple. No thyromegaly. No nodules. No JVD.  PULMONARY: Mild rhonchi CARDIOVASCULAR: S1 and S2.  Regular rate and rhythm. No murmurs, rubs, or gallops. No edema. Pedal pulses 2+ bilaterally.  GASTROINTESTINAL: Soft, nontender, nondistended. No masses. Positive bowel sounds. No hepatosplenomegaly.  MUSCULOSKELETAL: No swelling, clubbing, or edema. Range of motion full in all extremities.  NEUROLOGIC: Cranial nerves II through XII are intact. No gross focal neurological deficits. Sensation intact. Reflexes intact.  SKIN: No ulceration, lesions, rashes, or cyanosis. Skin warm and dry. Turgor intact.  PSYCHIATRIC: Mood, affect within normal limits. The patient is awake, alert and oriented x 3. Insight, judgment intact.       IMAGING    DG Chest 2 View  Result Date: 01/14/2020 CLINICAL DATA:  Cough, difficulty breathing EXAM: CHEST - 2 VIEW COMPARISON:  08/06/2019 FINDINGS: Frontal and lateral enlargement the cardiac silhouette views of the chest demonstrate marked. There is mild central vascular congestion without airspace disease, effusion, or pneumothorax. Chronic elevation of the right hemidiaphragm. IMPRESSION: Enlarged cardiac silhouette with mild central vascular congestion. No acute airspace disease. Electronically Signed   By: Randa Ngo M.D.   On: 01/14/2020 18:19   DG Chest Port 1 View  Result Date: 01/16/2020 CLINICAL DATA:  Pneumonia EXAM: PORTABLE CHEST 1 VIEW COMPARISON:  Two days ago FINDINGS: Mild patchy haziness over both lungs. Cardiopericardial enlargement and vascular pedicle widening. No visible effusion or pneumothorax. IMPRESSION: 1. Hazy pulmonary opacity correlating with history of pneumonia. 2. Cardiomegaly. Electronically Signed   By: Monte Fantasia M.D.   On: 01/16/2020 05:58      ASSESSMENT/PLAN   Acute on chronic hypoxemic respiratory failure Acute COVID19 infection -Remdesevir antiviral - pharmacy protocol 5 d -vitamin C -zinc -pred 40 daily  -Self prone not necessary for this patient -encourage to use IS-patient able to take 600cc tidal  volume -s/p MAB infusion  -d/c hepatotoxic medications while on remdesevir -supportive care with ICU telemetry monitoring -PT/OT when possible -procalcitonin, CRP and ferritin trending -discussed care plan with both sons today they are agreeable to care plan and thankful for care   Additional chronic comorbid conditions  COPD- dulera and comvent plus steroids  Pulmonary hypertension - Sildenafil TID OSA- patient does not wish to use CPAP device  Obesity -improved CKD- followed outpatient by [facility]- hx of blood transfusion  Deconditioning - pt/ot      Thank you for allowing me to participate in the care of this patient.   Patient/Family are satisfied with care plan and all questions have been answered.  This document was prepared using Dragon voice recognition software and may include unintentional dictation errors.     Ottie Glazier, M.D.  Division of [facility]

## 2020-01-17 NOTE — Care Management Important Message (Addendum)
Important Message  Patient Details  Name: Derek Shelton MRN: [id number] Date of Birth: 03/12/1940   Medicare Important Message Given:  Yes  Provided IM verbally to son, Garvey Westcott.    Anselm Pancoast, RN 01/17/2020, 12:32 PM

## 2020-01-17 NOTE — Progress Notes (Addendum)
Progress Note    Derek Shelton  [id number] DOB: 04-02-1940  DOA: 01/14/2020 PCP: Gladstone Lighter, MD      Brief Narrative:    Medical records reviewed and are as summarized below:  Derek Shelton is a 79 y.o. male with medical history significant for chronic diastolic CHF, pulmonary arterial hypertension, history of CVA, diabetes, atrial fibrillation on Eliquis, chronic kidney disease stage IIIa, chronic anemia, dementia, hypothyroidism, and CAD, now presenting to the emergency department with 3 days of progressive cough, shortness of breath, fevers, lethargy, and loss of appetite.  Household contacts have tested positive for COVID-19.       Assessment/Plan:   Principal Problem:   Acute respiratory disease due to COVID-19 virus Active Problems:   Chronic diastolic heart failure (HCC)   Atrial fibrillation with RVR (HCC)   AKI (acute kidney injury) ([place])   Coronary artery disease   Essential hypertension   Normocytic anemia   Dementia without behavioral disturbance (HCC)   History of CVA (cerebrovascular accident)   Chronic kidney disease, stage 3a ([place])   Type II diabetes mellitus with renal manifestations (HCC)   COPD (chronic obstructive pulmonary disease) (HCC)   Hypothyroidism   Body mass index is 33.61 kg/m.  (Obesity)    COVID-19 pneumonia:  Patient is not hypoxic.  There is no documented hypoxia in the hospital.  Dr. Linus Mako, his son, said that hypoxia from home was likely from his obstructive sleep apnea.  He said patient is nonadherent to CPAP and sometimes he becomes hypoxic from not wearing his CPAP.  His son (Dr. Linus Mako, an intensivist) requested that patient will be restarted on IV remdesivir on steroids.  He also requested pulmonology consult.  Patient sees Dr. Lanney Gins, pulmonologist, as an outpatient. Dr. Lanney Gins has been consulted per family's request.  He has been restarted on IV remdesivir. Start prednisone because of COPD  flare.  S/p with bamlanivimab and etesevimab infusion on 01/16/2020.    Atrial fibrillation with RVR  Continue IV Cardizem infusion and taper off as able. Continue metoprolol and Eliquis. - CHADS-VASc is 55 (age x2, CVA x2, DM, HTN, CHF)  COPD  with mild exacerbation. Have been started on prednisone.  Continue bronchodilators.  Chronic diastolic CHF; PAH   Hold torsemide.  Continue sildenafil.   AKI on CKD IIIa  No significant change in creatinine.  It is only slightly down from 1.78-1.75.  Continue to hold torsemide.  Restart Ringer's lactate infusion at 50 cc/h for 12 hours.   Insulin-dependent DM  - A1c was 6.1% in October 2021  Continue Lantus and NovoLog as needed for hyperglycemia.    CAD; elevated troponin  Mildly elevated troponin is likely due to demand ischemia. Continue metoprolol and Eliquis.  Hypothyroidism   Continue Synthroid    Dementia  Continue supportive care  History of stroke On Eliquis   Plan of care was discussed with his sons Deatra Canter at the bedside and Dr. Linus Mako over the phone, via video call)      Diet Order            Diet heart healthy/carb modified Room service appropriate? Yes; Fluid consistency: Thin  Diet effective now                    Consultants:  None  Procedures:  None    Medications:   . apixaban  2.5 mg Oral BID  . atorvastatin  40 mg Oral q1800  . donepezil  5  mg Oral QHS  . DULoxetine  60 mg Oral Daily  . ferrous sulfate  325 mg Oral Daily  . finasteride  5 mg Oral Daily  . insulin aspart  0-5 Units Subcutaneous QHS  . insulin aspart  0-9 Units Subcutaneous TID WC  . insulin glargine  10 Units Subcutaneous QHS  . Ipratropium-Albuterol  1 puff Inhalation Q6H  . levothyroxine  112 mcg Oral Q0600  . linagliptin  5 mg Oral Daily  . metoprolol tartrate  25 mg Oral BID  . mometasone-formoterol  2 puff Inhalation BID  . montelukast  10 mg Oral QHS  . pantoprazole  40 mg Oral Daily  . potassium  chloride SA  20 mEq Oral Daily  . predniSONE  40 mg Oral Daily  . sildenafil  20 mg Oral TID  . tamsulosin  0.4 mg Oral Daily  . tiotropium  18 mcg Inhalation Daily   Continuous Infusions: . sodium chloride 250 mL (01/15/20 1020)  . sodium chloride    . diltiazem (CARDIZEM) infusion 5 mg/hr (01/17/20 0854)  . famotidine (PEPCID) IV    . lactated ringers 50 mL/hr at 01/17/20 1252  . remdesivir 100 mg in NS 100 mL 100 mg (01/17/20 1256)     Anti-infectives (From admission, onward)   Start     Dose/Rate Route Frequency Ordered Stop   01/17/20 1215  remdesivir 100 mg in sodium chloride 0.9 % 100 mL IVPB        100 mg 200 mL/hr over 30 Minutes Intravenous Daily 01/17/20 1126 01/20/20 0959   01/15/20 1000  remdesivir 100 mg in sodium chloride 0.9 % 100 mL IVPB  Status:  Discontinued       "Followed by" Linked Group Details   100 mg 200 mL/hr over 30 Minutes Intravenous Daily 01/14/20 2101 01/16/20 0807   01/14/20 2200  remdesivir 200 mg in sodium chloride 0.9% 250 mL IVPB       "Followed by" Linked Group Details   200 mg 580 mL/hr over 30 Minutes Intravenous Once 01/14/20 2101 01/15/20 0106   01/14/20 2045  cefTRIAXone (ROCEPHIN) 2 g in sodium chloride 0.9 % 100 mL IVPB  Status:  Discontinued        2 g 200 mL/hr over 30 Minutes Intravenous Every 24 hours 01/14/20 2044 01/15/20 1542   01/14/20 2045  azithromycin (ZITHROMAX) 500 mg in sodium chloride 0.9 % 250 mL IVPB  Status:  Discontinued        500 mg 250 mL/hr over 60 Minutes Intravenous Every 24 hours 01/14/20 2044 01/15/20 1542             Family Communication/Anticipated D/C date and plan/Code Status   DVT prophylaxis:  apixaban (ELIQUIS) tablet 2.5 mg     Code Status: Full Code  Family Communication: Plan discussed with Deatra Canter, his son, at the bedside.  Plan was also discussed with his other son, Dr. Linus Mako Disposition Plan:    Status is: Inpatient  Remains inpatient appropriate because:IV treatments  appropriate due to intensity of illness or inability to take PO and Inpatient level of care appropriate due to severity of illness   Dispo: The patient is from: Home              Anticipated d/c is to: Home              Anticipated d/c date is: 1 day              Patient currently is not medically  stable to d/c.           Subjective:   Interval events noted. C/o cough, was initially productive of bloody sputum.  However, sputum is no longer bloody.  His son,, Deatra Canter, was at the bedside.  Objective:    Vitals:   01/17/20 0456 01/17/20 0557 01/17/20 0840 01/17/20 1255  BP: 120/87  113/81 116/71  Pulse: 83  71 78  Resp:   18 18  Temp: (!) 97.5 F (36.4 C)  97.9 F (36.6 C) 98.7 F (37.1 C)  TempSrc:   Oral Oral  SpO2: 96%  99% 99%  Weight:  91.6 kg    Height:       No data found.   Intake/Output Summary (Last 24 hours) at 01/17/2020 1410 Last data filed at 01/17/2020 1300 Gross per 24 hour  Intake 727.7 ml  Output 2050 ml  Net -1322.3 ml   Filed Weights   01/14/20 1804 01/16/20 0321 01/17/20 0557  Weight: 88 kg 88.1 kg 91.6 kg    Exam:  GEN: NAD SKIN: Warm and dry EYES: No pallor or icterus ENT: MMM CV: RRR PULM: Bilateral expiratory wheezing.  No rales heard. ABD: soft, obese, NT, +BS CNS: AAO x 3, non focal EXT: No edema or tenderness      Data Reviewed:   I have personally reviewed following labs and imaging studies:  Labs: Labs show the following:   Basic Metabolic Panel: Recent Labs  Lab 01/14/20 1829 01/15/20 0437 01/16/20 0403 01/17/20 0419  NA 138 135 137 132*  K 3.6 4.1 3.8 4.0  CL 98 99 98 97*  CO2 29 25 26 26   GLUCOSE 108* 116* 201* 151*  BUN 38* 43* 61* 77*  CREATININE 1.33* 1.40* 1.78* 1.75*  CALCIUM 8.9 9.0 8.7* 8.2*  MG  --  2.3  --   --   PHOS  --  5.2*  --   --    GFR Estimated Creatinine Clearance: 34.4 mL/min (A) (by C-G formula based on SCr of 1.75 mg/dL (H)). Liver Function Tests: Recent Labs  Lab  01/14/20 1829 01/15/20 0437 01/16/20 0403 01/17/20 0419  AST 30 34 39 32  ALT 18 17 20 19   ALKPHOS 95 88 87 69  BILITOT 0.9 0.7 0.8 0.7  PROT 8.3* 7.8 7.6 6.8  ALBUMIN 3.9 3.5 3.5 3.1*   No results for input(s): LIPASE, AMYLASE in the last 168 hours. No results for input(s): AMMONIA in the last 168 hours. Coagulation profile No results for input(s): INR, PROTIME in the last 168 hours.  CBC: Recent Labs  Lab 01/14/20 1829 01/15/20 0437 01/16/20 0403 01/17/20 0419  WBC 6.9 4.4 4.4 7.0  NEUTROABS 5.6 3.9 3.4 5.0  HGB 9.8* 9.6* 9.8* 8.6*  HCT 31.9* 30.3* 30.9* 27.3*  MCV 90.4 88.3 88.0 88.1  PLT 157 144* 168 162   Cardiac Enzymes: No results for input(s): CKTOTAL, CKMB, CKMBINDEX, TROPONINI in the last 168 hours. BNP (last 3 results) No results for input(s): PROBNP in the last 8760 hours. CBG: Recent Labs  Lab 01/16/20 1214 01/16/20 1641 01/16/20 1934 01/17/20 0827 01/17/20 1250  GLUCAP 206* 158* 160* 108* 124*   D-Dimer: No results for input(s): DDIMER in the last 72 hours. Hgb A1c: No results for input(s): HGBA1C in the last 72 hours. Lipid Profile: Recent Labs    01/14/20 1933  TRIG 32   Thyroid function studies: No results for input(s): TSH, T4TOTAL, T3FREE, THYROIDAB in the last 72 hours.  Invalid input(s): FREET3  Anemia work up: Recent Labs    01/16/20 0403 01/17/20 0419  FERRITIN 34 29   Sepsis Labs: Recent Labs  Lab 01/14/20 1829 01/14/20 1929 01/15/20 0437 01/16/20 0403 01/17/20 0419  PROCALCITON  --  0.41 0.39 0.46  --   WBC 6.9  --  4.4 4.4 7.0    Microbiology Recent Results (from the past 240 hour(s))  Resp Panel by RT-PCR (Flu A&B, Covid) Nasopharyngeal Swab     Status: Abnormal   Collection Time: 01/14/20  6:41 PM   Specimen: Nasopharyngeal Swab; Nasopharyngeal(NP) swabs in vial transport medium  Result Value Ref Range Status   SARS Coronavirus 2 by RT PCR POSITIVE (A) NEGATIVE Final    Comment: RESULT CALLED TO, READ BACK  BY AND VERIFIED WITH: CANDICE KIO @1934  ON 01/14/20 SKL (NOTE) SARS-CoV-2 target nucleic acids are DETECTED.  The SARS-CoV-2 RNA is generally detectable in upper respiratory specimens during the acute phase of infection. Positive results are indicative of the presence of the identified virus, but do not rule out bacterial infection or co-infection with other pathogens not detected by the test. Clinical correlation with patient history and other diagnostic information is necessary to determine patient infection status. The expected result is Negative.  Fact Sheet for Patients: EntrepreneurPulse.com.au  Fact Sheet for Healthcare Providers: IncredibleEmployment.be  This test is not yet approved or cleared by the [place] FDA and  has been authorized for detection and/or diagnosis of SARS-CoV-2 by FDA under an Emergency Use Authorization (EUA).  This EUA will remain in effect (meaning this test can be  used) for the duration of  the COVID-19 declaration under Section 564(b)(1) of the Act, 21 U.S.C. section 360bbb-3(b)(1), unless the authorization is terminated or revoked sooner.     Influenza A by PCR NEGATIVE NEGATIVE Final   Influenza B by PCR NEGATIVE NEGATIVE Final    Comment: (NOTE) The Xpert Xpress SARS-CoV-2/FLU/RSV plus assay is intended as an aid in the diagnosis of influenza from Nasopharyngeal swab specimens and should not be used as a sole basis for treatment. Nasal washings and aspirates are unacceptable for Xpert Xpress SARS-CoV-2/FLU/RSV testing.  Fact Sheet for Patients: EntrepreneurPulse.com.au  Fact Sheet for Healthcare Providers: IncredibleEmployment.be  This test is not yet approved or cleared by the [place] FDA and has been authorized for detection and/or diagnosis of SARS-CoV-2 by FDA under an Emergency Use Authorization (EUA). This EUA will remain in effect (meaning this  test can be used) for the duration of the COVID-19 declaration under Section 564(b)(1) of the Act, 21 U.S.C. section 360bbb-3(b)(1), unless the authorization is terminated or revoked.  Performed at [facility], [address] [place], [place] [address]   CULTURE, BLOOD (ROUTINE X 2) w Reflex to ID Panel     Status: None (Preliminary result)   Collection Time: 01/14/20  9:37 PM   Specimen: BLOOD  Result Value Ref Range Status   Specimen Description BLOOD BLOOD RIGHT FOREARM  Final   Special Requests   Final    BOTTLES DRAWN AEROBIC AND ANAEROBIC Blood Culture adequate volume   Culture   Final    NO GROWTH 3 DAYS Performed at [facility], [address] [place], [place] [address]    Report Status PENDING  Incomplete  CULTURE, BLOOD (ROUTINE X 2) w Reflex to ID Panel     Status: None (Preliminary result)   Collection Time: 01/14/20  9:37 PM   Specimen: BLOOD  Result Value Ref Range Status   Specimen Description  BLOOD RIGHT ASSIST CONTROL  Final   Special Requests   Final    BOTTLES DRAWN AEROBIC AND ANAEROBIC Blood Culture adequate volume   Culture   Final    NO GROWTH 3 DAYS Performed at [facility], [address], [place],  [address]    Report Status PENDING  Incomplete    Procedures and diagnostic studies:  DG Chest Port 1 View  Result Date: 01/16/2020 CLINICAL DATA:  Pneumonia EXAM: PORTABLE CHEST 1 VIEW COMPARISON:  Two days ago FINDINGS: Mild patchy haziness over both lungs. Cardiopericardial enlargement and vascular pedicle widening. No visible effusion or pneumothorax. IMPRESSION: 1. Hazy pulmonary opacity correlating with history of pneumonia. 2. Cardiomegaly. Electronically Signed   By: Monte Fantasia M.D.   On: 01/16/2020 05:58               LOS: 3 days   Ellasyn Swilling  [facility]   Pager on [email or web address]. If 7PM-7AM, please contact night-coverage at [email or web address]     01/17/2020, 2:10 PM

## 2020-01-17 NOTE — Consult Note (Signed)
Remdesivir - Pharmacy Brief Note   O:  ALT: 19 CXR: "Enlarged cardiac silhouette with mild central vascular congestion. No acute airspace disease" SpO2: 96-99% on room air   A/P:  12/21 SARS-CoV-2 PCR (+)  Remdesivir 200 mg IVPB once followed by 100 mg IVPB daily x 4 days. Pt received Loading dose and first maintenance dose of remdesivir. Remdesivir was stopped and pt received bamlanivimab/etesevimab on 12/23. Will resume with remaining three 100mg  doses.  Benn Moulder, PharmD Pharmacy Resident  01/17/2020 11:30 AM

## 2020-01-18 DIAGNOSIS — U071 COVID-19: Secondary | ICD-10-CM | POA: Diagnosis not present

## 2020-01-18 DIAGNOSIS — N179 Acute kidney failure, unspecified: Secondary | ICD-10-CM | POA: Diagnosis not present

## 2020-01-18 DIAGNOSIS — I5032 Chronic diastolic (congestive) heart failure: Secondary | ICD-10-CM | POA: Diagnosis not present

## 2020-01-18 DIAGNOSIS — I4891 Unspecified atrial fibrillation: Secondary | ICD-10-CM | POA: Diagnosis not present

## 2020-01-18 LAB — CBC WITH DIFFERENTIAL/PLATELET
Abs Immature Granulocytes: 0.04 10*3/uL (ref 0.00–0.07)
Basophils Absolute: 0 10*3/uL (ref 0.0–0.1)
Basophils Relative: 0 %
Eosinophils Absolute: 0 10*3/uL (ref 0.0–0.5)
Eosinophils Relative: 0 %
HCT: 26.4 % — ABNORMAL LOW (ref 39.0–52.0)
Hemoglobin: 8.5 g/dL — ABNORMAL LOW (ref 13.0–17.0)
Immature Granulocytes: 1 %
Lymphocytes Relative: 11 %
Lymphs Abs: 0.7 10*3/uL (ref 0.7–4.0)
MCH: 28 pg (ref 26.0–34.0)
MCHC: 32.2 g/dL (ref 30.0–36.0)
MCV: 86.8 fL (ref 80.0–100.0)
Monocytes Absolute: 0.5 10*3/uL (ref 0.1–1.0)
Monocytes Relative: 7 %
Neutro Abs: 5.2 10*3/uL (ref 1.7–7.7)
Neutrophils Relative %: 81 %
Platelets: 153 10*3/uL (ref 150–400)
RBC: 3.04 MIL/uL — ABNORMAL LOW (ref 4.22–5.81)
RDW: 15.3 % (ref 11.5–15.5)
WBC: 6.4 10*3/uL (ref 4.0–10.5)
nRBC: 0 % (ref 0.0–0.2)

## 2020-01-18 LAB — FERRITIN: Ferritin: 28 ng/mL (ref 24–336)

## 2020-01-18 LAB — COMPREHENSIVE METABOLIC PANEL
ALT: 20 U/L (ref 0–44)
AST: 32 U/L (ref 15–41)
Albumin: 3.3 g/dL — ABNORMAL LOW (ref 3.5–5.0)
Alkaline Phosphatase: 66 U/L (ref 38–126)
Anion gap: 9 (ref 5–15)
BUN: 67 mg/dL — ABNORMAL HIGH (ref 8–23)
CO2: 26 mmol/L (ref 22–32)
Calcium: 8.4 mg/dL — ABNORMAL LOW (ref 8.9–10.3)
Chloride: 97 mmol/L — ABNORMAL LOW (ref 98–111)
Creatinine, Ser: 1.33 mg/dL — ABNORMAL HIGH (ref 0.61–1.24)
GFR, Estimated: 54 mL/min — ABNORMAL LOW (ref >60)
Glucose, Bld: 243 mg/dL — ABNORMAL HIGH (ref 70–99)
Potassium: 4.5 mmol/L (ref 3.5–5.1)
Sodium: 132 mmol/L — ABNORMAL LOW (ref 135–145)
Total Bilirubin: 0.5 mg/dL (ref 0.3–1.2)
Total Protein: 6.8 g/dL (ref 6.5–8.1)

## 2020-01-18 LAB — GLUCOSE, CAPILLARY
Glucose-Capillary: 296 mg/dL — ABNORMAL HIGH (ref 70–99)
Glucose-Capillary: 170 mg/dL — ABNORMAL HIGH (ref 70–99)
Glucose-Capillary: 131 mg/dL — ABNORMAL HIGH (ref 70–99)
Glucose-Capillary: 221 mg/dL — ABNORMAL HIGH (ref 70–99)
Glucose-Capillary: 230 mg/dL — ABNORMAL HIGH (ref 70–99)

## 2020-01-18 LAB — C-REACTIVE PROTEIN: CRP: 0.9 mg/dL (ref <1.0)

## 2020-01-18 LAB — FIBRIN DERIVATIVES D-DIMER (ARMC ONLY): Fibrin derivatives D-dimer (ARMC): 321.38 ng/mL (FEU) (ref 0.00–499.00)

## 2020-01-18 MED ORDER — INSULIN GLARGINE 100 UNIT/ML ~~LOC~~ SOLN
15.0000 [IU] | Freq: Every day | SUBCUTANEOUS | Status: DC
  Administered 2020-01-18 – 2020-01-20 (×3): 15 [IU] via SUBCUTANEOUS
  Filled 2020-01-18 (×4): qty 0.15

## 2020-01-18 MED ORDER — POLYETHYLENE GLYCOL 3350 17 G PO PACK
17.0000 g | PACK | Freq: Every day | ORAL | Status: DC
  Administered 2020-01-19 – 2020-01-20 (×2): 17 g via ORAL
  Filled 2020-01-18 (×2): qty 1

## 2020-01-18 MED ORDER — METOPROLOL TARTRATE 50 MG PO TABS
50.0000 mg | ORAL_TABLET | Freq: Two times a day (BID) | ORAL | Status: DC
  Administered 2020-01-18 – 2020-01-21 (×7): 50 mg via ORAL
  Filled 2020-01-18 (×7): qty 1

## 2020-01-18 MED ORDER — APIXABAN 5 MG PO TABS
5.0000 mg | ORAL_TABLET | Freq: Two times a day (BID) | ORAL | Status: DC
  Administered 2020-01-18 – 2020-01-20 (×4): 5 mg via ORAL
  Filled 2020-01-18 (×4): qty 1

## 2020-01-18 MED ORDER — TORSEMIDE 20 MG PO TABS: 20.0000 mg | ORAL_TABLET | Freq: Every day | ORAL | Status: DC

## 2020-01-18 NOTE — Plan of Care (Signed)

## 2020-01-18 NOTE — Consult Note (Signed)
Pulmonary Medicine          Date: 01/18/2020,   MRN# [id number] Derek Shelton 22-Jan-1941     AdmissionWeight: 88 kg                 CurrentWeight: 91.3 kg   Referring physician: Dr Myriam Forehand   CHIEF COMPLAINT:   Acute on chronic hypoxemic respiratory failure due to COVID19   HISTORY OF PRESENT ILLNESS   Derek Shelton is a 79 y.o. male with medical history significant for chronic diastolic CHF, pulmonary arterial hypertension, history of CVA, diabetes, atrial fibrillation on Eliquis, chronic kidney disease stage IIIa, chronic anemia, dementia, hypothyroidism, and CAD, now presenting to the emergency department with 3 days of progressive cough, shortness of breath, fevers, lethargy, and loss of appetite.  Household contacts have tested positive for COVID-19.  Patient has not had any chest pain, abdominal pain, diarrhea, or vomiting.  No recent leg swelling or tenderness.  No hemoptysis.  Patient's son at the bedside assists with the history and notes that the patient has been monitoring her O2 saturations at home and has been dropping into the mid 80s with minimal exertion.  Upon arrival to the ED, patient is found to be febrile to 38.1 C, saturating mid 90s while at rest, mildly tachypneic, tachycardic as high as 150 in atrial fibrillation, and with blood pressure 118/90.  EKG features atrial fibrillation with RVR, rate 128.  Chest x-ray notable for cardiomegaly and mild vascular congestion.  Chemistry panel with creatinine 1.33.  CBC notable for hemoglobin of 9.8, similar to last month.  High-sensitivity troponin is slightly elevated and BNP is 300.  Blood cultures were ordered from the emergency department and the patient was started on remdesivir, Solu-Medrol, Rocephin, and azithromycin. Due to patient complex and comorbid status family requested pulmonary consultation for additional input.   01/18/20- patient improved.  He is on room air.  Son at bedside.  For PT/OT.   PAST  MEDICAL HISTORY   Past Medical History:  Diagnosis Date  . Anemia   . Asthma   . CHF (congestive heart failure) (HCC)   . COPD (chronic obstructive pulmonary disease) (HCC)   . Coronary artery disease   . Dementia (HCC)    Per son's report  . Diabetes mellitus without complication (HCC)   . Dysrhythmia    Atrial Fibrillation  . GERD (gastroesophageal reflux disease)   . GI bleed   . Hyperlipidemia   . Hypertension   . Hypothyroidism   . Shortness of breath dyspnea   . Sleep apnea   . Stroke (HCC)   . Thyroid disease      SURGICAL HISTORY   Past Surgical History:  Procedure Laterality Date  . CARDIAC CATHETERIZATION    . COLONOSCOPY N/A 08/10/2014   Procedure: COLONOSCOPY;  Surgeon: Scot Jun, MD;  Location: [facility] ENDOSCOPY;  Service: Endoscopy;  Laterality: N/A;  . COLONOSCOPY WITH PROPOFOL N/A 04/05/2016   Procedure: COLONOSCOPY WITH PROPOFOL;  Surgeon: Christena Deem, MD;  Location: [facility] ENDOSCOPY;  Service: Endoscopy;  Laterality: N/A;  . COLONOSCOPY WITH PROPOFOL N/A 07/10/2019   Procedure: COLONOSCOPY WITH PROPOFOL;  Surgeon: Midge Minium, MD;  Location: [facility] ENDOSCOPY;  Service: Endoscopy;  Laterality: N/A;  . ESOPHAGOGASTRODUODENOSCOPY N/A 08/08/2014   Procedure: ESOPHAGOGASTRODUODENOSCOPY (EGD);  Surgeon: Scot Jun, MD;  Location: [facility] ENDOSCOPY;  Service: Endoscopy;  Laterality: N/A;  plan for early afternoon  . ESOPHAGOGASTRODUODENOSCOPY (EGD) WITH PROPOFOL N/A 07/10/2019   Procedure: ESOPHAGOGASTRODUODENOSCOPY (  EGD) WITH PROPOFOL;  Surgeon: Lucilla Lame, MD;  Location: [facility] ENDOSCOPY;  Service: Endoscopy;  Laterality: N/A;  . EYE SURGERY    . GIVENS CAPSULE STUDY N/A 08/12/2014   Procedure: GIVENS CAPSULE STUDY;  Surgeon: Manya Silvas, MD;  Location[facility] END[facility]COPY;  Service: Endoscopy;  Laterality: N/A;  . rectal fistula N/A      FAMILY HISTORY   Family History  Problem Relation Age of Onset  . Diabetes Mother      SOCIAL HISTORY    Social History   Tobacco Use  . Smoking status: Former Smoker    Quit date: 2000    Years since quitting: 21.9  . Smokeless tobacco: Former Systems developer    Types: Secondary school teacher  . Vaping Use: Never used  Substance Use Topics  . Alcohol use: No  . Drug use: No     MEDICATIONS    Home Medication:    Current Medication:  Current Facility-Administered Medications:  .  0.9 %  sodium chloride infusion, 250 mL, Intravenous, PRN, Opyd, Ilene Qua, MD, Last Rate: 10 mL/hr at 01/15/20 1020, 250 mL at 01/15/20 1020 .  0.9 %  sodium chloride infusion, , Intravenous, PRN, Jennye Boroughs, MD, Last Rate: 10 mL/hr at 01/18/20 0856, 250 mL at 01/18/20 0856 .  acetaminophen (TYLENOL) tablet 650 mg, 650 mg, Oral, Q6H PRN, Opyd, Ilene Qua, MD, 650 mg at 01/16/20 2120 .  apixaban (ELIQUIS) tablet 5 mg, 5 mg, Oral, BID, Jennye Boroughs, MD .  atorvastatin (LIPITOR) tablet 40 mg, 40 mg, Oral, q1800, Jennye Boroughs, MD, 40 mg at 01/18/20 0153 .  chlorpheniramine-HYDROcodone (TUSSIONEX) 10-8 MG/5ML suspension 5 mL, 5 mL, Oral, Q12H PRN, Opyd, Ilene Qua, MD, 5 mL at 01/18/20 1313 .  diltiazem (CARDIZEM) 125 mg in dextrose 5% 125 mL (1 mg/mL) infusion, 5-15 mg/hr, Intravenous, Titrated, Jennye Boroughs, MD, Last Rate: 12.5 mL/hr at 01/18/20 1313, 12.5 mg/hr at 01/18/20 1313 .  donepezil (ARICEPT) tablet 5 mg, 5 mg, Oral, QHS, Jennye Boroughs, MD, 5 mg at 01/16/20 2108 .  DULoxetine (CYMBALTA) DR capsule 60 mg, 60 mg, Oral, Daily, Jennye Boroughs, MD, 60 mg at 01/18/20 0900 .  EPINEPHrine (EPI-PEN) injection 0.3 mg, 0.3 mg, Intramuscular, Once PRN, Jennye Boroughs, MD .  ferrous sulfate tablet 325 mg, 325 mg, Oral, Daily, Jennye Boroughs, MD, 325 mg at 01/18/20 0901 .  finasteride (PROSCAR) tablet 5 mg, 5 mg, Oral, Daily, Jennye Boroughs, MD, 5 mg at 01/18/20 0906 .  guaiFENesin-dextromethorphan (ROBITUSSIN DM) 100-10 MG/5ML syrup 10 mL, 10 mL, Oral, Q4H PRN, Opyd, Ilene Qua, MD, 10 mL at 01/14/20 2225 .  insulin  aspart (novoLOG) injection 0-5 Units, 0-5 Units, Subcutaneous, QHS, Opyd, Ilene Qua, MD, 3 Units at 01/18/20 0152 .  insulin aspart (novoLOG) injection 0-9 Units, 0-9 Units, Subcutaneous, TID WC, Opyd, Ilene Qua, MD, 1 Units at 01/18/20 1312 .  insulin glargine (LANTUS) injection 15 Units, 15 Units, Subcutaneous, QHS, Jennye Boroughs, MD .  Ipratropium-Albuterol (COMBIVENT) respimat 1 puff, 1 puff, Inhalation, Q6H, Opyd, Ilene Qua, MD, 1 puff at 01/18/20 1418 .  levothyroxine (SYNTHROID) tablet 112 mcg, 112 mcg, Oral, Q0600, Jennye Boroughs, MD, 112 mcg at 01/18/20 0646 .  linagliptin (TRADJENTA) tablet 5 mg, 5 mg, Oral, Daily, Opyd, Ilene Qua, MD, 5 mg at 01/18/20 0901 .  metoprolol tartrate (LOPRESSOR) injection 2.5 mg, 2.5 mg, Intravenous, Q2H PRN, Opyd, Ilene Qua, MD, 2.5 mg at 01/15/20 1705 .  metoprolol tartrate (LOPRESSOR) tablet 50 mg, 50 mg, Oral, BID,  Jennye Boroughs, MD, 50 mg at 01/18/20 0901 .  mometasone-formoterol (DULERA) 200-5 MCG/ACT inhaler 2 puff, 2 puff, Inhalation, BID, Opyd, Ilene Qua, MD, 2 puff at 01/18/20 0903 .  montelukast (SINGULAIR) tablet 10 mg, 10 mg, Oral, QHS, Opyd, Ilene Qua, MD, 10 mg at 01/18/20 0154 .  ondansetron (ZOFRAN) tablet 4 mg, 4 mg, Oral, Q6H PRN **OR** ondansetron (ZOFRAN) injection 4 mg, 4 mg, Intravenous, Q6H PRN, Opyd, Timothy S, MD .  pantoprazole (PROTONIX) EC tablet 40 mg, 40 mg, Oral, Daily, Opyd, Ilene Qua, MD, 40 mg at 01/18/20 0904 .  potassium chloride SA (KLOR-CON) CR tablet 20 mEq, 20 mEq, Oral, Daily, Jennye Boroughs, MD, 20 mEq at 01/18/20 0903 .  predniSONE (DELTASONE) tablet 40 mg, 40 mg, Oral, Daily, Jennye Boroughs, MD, 40 mg at 01/18/20 0900 .  remdesivir 100 mg in sodium chloride 0.9 % 100 mL IVPB, 100 mg, Intravenous, Daily, Jennye Boroughs, MD, Last Rate: 200 mL/hr at 01/18/20 0857, 100 mg at 01/18/20 0857 .  sildenafil (REVATIO) tablet 20 mg, 20 mg, Oral, TID, Jennye Boroughs, MD, 20 mg at 01/18/20 0859 .  tamsulosin (FLOMAX) capsule  0.4 mg, 0.4 mg, Oral, Daily, Jennye Boroughs, MD, 0.4 mg at 01/18/20 0901 .  tiotropium (SPIRIVA) inhalation capsule ([facility] use ONLY) 18 mcg, 18 mcg, Inhalation, Daily, Opyd, Ilene Qua, MD, 18 mcg at 01/18/20 0905 .  [START ON 01/19/2020] torsemide (DEMADEX) tablet 20 mg, 20 mg, Oral, Daily, Jennye Boroughs, MD    ALLERGIES   Carvedilol, Penicillins, Aspirin, Aricept [donepezil hcl], and Catapres [clonidine hcl]     REVIEW OF SYSTEMS    Review of Systems:  Gen:  Denies  fever, sweats, chills weigh loss  HEENT: Denies blurred vision, double vision, ear pain, eye pain, hearing loss, nose bleeds, sore throat Cardiac:  No dizziness, chest pain or heaviness, chest tightness,edema Resp:   Denies cough or sputum porduction, shortness of breath,wheezing, hemoptysis,  Gi: Denies swallowing difficulty, stomach pain, nausea or vomiting, diarrhea, constipation, bowel incontinence Gu:  Denies bladder incontinence, burning urine Ext:   Denies Joint pain, stiffness or swelling Skin: Denies  skin rash, easy bruising or bleeding or hives Endoc:  Denies polyuria, polydipsia , polyphagia or weight change Psych:   Denies depression, insomnia or hallucinations   Other:  All other systems negative   VS: BP 107/77 (BP Location: Left Arm)   Pulse (!) 103   Temp 97.7 F (36.5 C) (Oral)   Resp 20   Ht 5\' 5"  (1.651 m)   Wt 91.3 kg   SpO2 98%   BMI 33.48 kg/m      PHYSICAL EXAM    GENERAL:NAD, no fevers, chills, no weakness no fatigue HEAD: Normocephalic, atraumatic.  EYES: Pupils equal, round, reactive to light. Extraocular muscles intact. No scleral icterus.  MOUTH: Moist mucosal membrane. Dentition intact. No abscess noted.  EAR, NOSE, THROAT: Clear without exudates. No external lesions.  NECK: Supple. No thyromegaly. No nodules. No JVD.  PULMONARY: Mild rhonchi CARDIOVASCULAR: S1 and S2. Regular rate and rhythm. No murmurs, rubs, or gallops. No edema. Pedal pulses 2+ bilaterally.   GASTROINTESTINAL: Soft, nontender, nondistended. No masses. Positive bowel sounds. No hepatosplenomegaly.  MUSCULOSKELETAL: No swelling, clubbing, or edema. Range of motion full in all extremities.  NEUROLOGIC: Cranial nerves II through XII are intact. No gross focal neurological deficits. Sensation intact. Reflexes intact.  SKIN: No ulceration, lesions, rashes, or cyanosis. Skin warm and dry. Turgor intact.  PSYCHIATRIC: Mood, affect within normal limits. The patient is awake,  alert and oriented x 3. Insight, judgment intact.       IMAGING    DG Chest 2 View  Result Date: 01/14/2020 CLINICAL DATA:  Cough, difficulty breathing EXAM: CHEST - 2 VIEW COMPARISON:  08/06/2019 FINDINGS: Frontal and lateral enlargement the cardiac silhouette views of the chest demonstrate marked. There is mild central vascular congestion without airspace disease, effusion, or pneumothorax. Chronic elevation of the right hemidiaphragm. IMPRESSION: Enlarged cardiac silhouette with mild central vascular congestion. No acute airspace disease. Electronically Signed   By: Randa Ngo M.D.   On: 01/14/2020 18:19   DG Chest Port 1 View  Result Date: 01/16/2020 CLINICAL DATA:  Pneumonia EXAM: PORTABLE CHEST 1 VIEW COMPARISON:  Two days ago FINDINGS: Mild patchy haziness over both lungs. Cardiopericardial enlargement and vascular pedicle widening. No visible effusion or pneumothorax. IMPRESSION: 1. Hazy pulmonary opacity correlating with history of pneumonia. 2. Cardiomegaly. Electronically Signed   By: Monte Fantasia M.D.   On: 01/16/2020 05:58      ASSESSMENT/PLAN   Acute on chronic hypoxemic respiratory failure Acute COVID19 infection -Remdesevir antiviral - pharmacy protocol 5 d -vitamin C -zinc -pred 40 daily  -Self prone not necessary for this patient -encourage to use IS-patient able to take 600cc tidal volume -s/p MAB infusion  -d/c hepatotoxic medications while on remdesevir -supportive care with  ICU telemetry monitoring -PT/OT when possible -procalcitonin, CRP and ferritin trending -discussed care plan with both sons today they are agreeable to care plan and thankful for care   Additional chronic comorbid conditions  COPD- dulera and comvent plus steroids  Pulmonary hypertension - Sildenafil TID OSA- patient does not wish to use CPAP device  Obesity -improved CKD- followed outpatient by [place]- hx of blood transfusion  Deconditioning - pt/ot      Thank you for allowing me to participate in the care of this patient.   Patient/Family are satisfied with care plan and all questions have been answered.  This document was prepared using Dragon voice recognition software and may include unintentional dictation errors.     Ottie Glazier, M.D.  Division of [place]

## 2020-01-18 NOTE — Progress Notes (Addendum)
Progress Note    YVAN JUHNKE  [id number] DOB: 10/10/1940  DOA: 01/14/2020 PCP: Gladstone Lighter, MD      Brief Narrative:    Medical records reviewed and are as summarized below:  Derek Shelton is a 79 y.o. male with medical history significant for chronic diastolic CHF, pulmonary arterial hypertension, history of CVA, diabetes, atrial fibrillation on Eliquis, chronic kidney disease stage IIIa, chronic anemia, dementia, hypothyroidism, and CAD, now presenting to the emergency department with 3 days of progressive cough, shortness of breath, fevers, lethargy, and loss of appetite.  Household contacts have tested positive for COVID-19.       Assessment/Plan:   Principal Problem:   Acute respiratory disease due to COVID-19 virus Active Problems:   Chronic diastolic heart failure (HCC)   Atrial fibrillation with RVR (HCC)   AKI (acute kidney injury) ([place])   Coronary artery disease   Essential hypertension   Normocytic anemia   Dementia without behavioral disturbance (HCC)   History of CVA (cerebrovascular accident)   Chronic kidney disease, stage 3a ([place])   Type II diabetes mellitus with renal manifestations (HCC)   COPD (chronic obstructive pulmonary disease) (HCC)   Hypothyroidism   Body mass index is 33.48 kg/m.  (Obesity)    COVID-19 pneumonia:  He is tolerating room air.  S/p with bamlanivimab and etesevimab infusion on 01/16/2020. Continue IV remdesivir and prednisone He has been evaluated by the pulmonologist  Atrial fibrillation with RVR  Continue IV Cardizem infusion and taper off as able Increase metoprolol from 25 mg twice daily to 50 mg twice daily for adequate rate control.  Continue Eliquis. - CHADS-VASc is 65 (age x2, CVA x2, DM, HTN, CHF)  COPD  with mild exacerbation. Continue prednisone and bronchodilators  Chronic diastolic CHF; PAH   Resume torsemide tomorrow.  Continue sildenafil.   AKI on CKD IIIa  Creatinine is  down to baseline.  Baseline is around 1.3.  IV fluids has been discontinued.  Insulin-dependent DM  - A1c was 6.1% in October 2021  Increase Lantus from 10 units to 15 units nightly.  Continue NovoLog as needed for hyperglycemia.    CAD; elevated troponin  Mildly elevated troponin is likely due to demand ischemia. Continue metoprolol and Eliquis.  Hypothyroidism   Continue Synthroid    Dementia  Continue supportive care  History of stroke On Eliquis   Plan of care was discussed with his son, Dr. Linus Mako, over the phone.       Diet Order            Diet heart healthy/carb modified Room service appropriate? Yes; Fluid consistency: Thin  Diet effective now                    Consultants:  None  Procedures:  None    Medications:   . apixaban  5 mg Oral BID  . atorvastatin  40 mg Oral q1800  . donepezil  5 mg Oral QHS  . DULoxetine  60 mg Oral Daily  . ferrous sulfate  325 mg Oral Daily  . finasteride  5 mg Oral Daily  . insulin aspart  0-5 Units Subcutaneous QHS  . insulin aspart  0-9 Units Subcutaneous TID WC  . insulin glargine  15 Units Subcutaneous QHS  . Ipratropium-Albuterol  1 puff Inhalation Q6H  . levothyroxine  112 mcg Oral Q0600  . linagliptin  5 mg Oral Daily  . metoprolol tartrate  50 mg Oral BID  .  mometasone-formoterol  2 puff Inhalation BID  . montelukast  10 mg Oral QHS  . pantoprazole  40 mg Oral Daily  . potassium chloride SA  20 mEq Oral Daily  . predniSONE  40 mg Oral Daily  . sildenafil  20 mg Oral TID  . tamsulosin  0.4 mg Oral Daily  . tiotropium  18 mcg Inhalation Daily  . [START ON 01/19/2020] torsemide  20 mg Oral Daily   Continuous Infusions: . sodium chloride 250 mL (01/15/20 1020)  . sodium chloride 250 mL (01/18/20 0856)  . diltiazem (CARDIZEM) infusion 10 mg/hr (01/18/20 0240)  . remdesivir 100 mg in NS 100 mL 100 mg (01/18/20 0857)     Anti-infectives (From admission, onward)   Start     Dose/Rate Route  Frequency Ordered Stop   01/17/20 1215  remdesivir 100 mg in sodium chloride 0.9 % 100 mL IVPB        100 mg 200 mL/hr over 30 Minutes Intravenous Daily 01/17/20 1126 01/20/20 0959   01/15/20 1000  remdesivir 100 mg in sodium chloride 0.9 % 100 mL IVPB  Status:  Discontinued       "Followed by" Linked Group Details   100 mg 200 mL/hr over 30 Minutes Intravenous Daily 01/14/20 2101 01/16/20 0807   01/14/20 2200  remdesivir 200 mg in sodium chloride 0.9% 250 mL IVPB       "Followed by" Linked Group Details   200 mg 580 mL/hr over 30 Minutes Intravenous Once 01/14/20 2101 01/15/20 0106   01/14/20 2045  cefTRIAXone (ROCEPHIN) 2 g in sodium chloride 0.9 % 100 mL IVPB  Status:  Discontinued        2 g 200 mL/hr over 30 Minutes Intravenous Every 24 hours 01/14/20 2044 01/15/20 1542   01/14/20 2045  azithromycin (ZITHROMAX) 500 mg in sodium chloride 0.9 % 250 mL IVPB  Status:  Discontinued        500 mg 250 mL/hr over 60 Minutes Intravenous Every 24 hours 01/14/20 2044 01/15/20 1542             Family Communication/Anticipated D/C date and plan/Code Status   DVT prophylaxis:  apixaban (ELIQUIS) tablet 5 mg     Code Status: Full Code  Family Communication: Plan discussed with his son, Dr. Linus Mako Disposition Plan:    Status is: Inpatient  Remains inpatient appropriate because:IV treatments appropriate due to intensity of illness or inability to take PO and Inpatient level of care appropriate due to severity of illness   Dispo: The patient is from: Home              Anticipated d/c is to: Home              Anticipated d/c date is: 1 day              Patient currently is not medically stable to d/c.           Subjective:   Interval events noted. C/o cough.  No shortness of breath or chest pain.  Objective:    Vitals:   01/18/20 0154 01/18/20 0251 01/18/20 0848 01/18/20 1234  BP: 123/84 113/90 (!) 126/95 107/77  Pulse: (!) 121 (!) 107 100 (!) 103  Resp:  17  16 20   Temp:  (!) 97.3 F (36.3 C) 98 F (36.7 C) 97.7 F (36.5 C)  TempSrc:   Oral Oral  SpO2:  96% 99% 98%  Weight:  91.3 kg    Height:  No data found.   Intake/Output Summary (Last 24 hours) at 01/18/2020 1304 Last data filed at 01/18/2020 0400 Gross per 24 hour  Intake 687.58 ml  Output 1300 ml  Net -612.42 ml   Filed Weights   01/16/20 0321 01/17/20 0557 01/18/20 0251  Weight: 88.1 kg 91.6 kg 91.3 kg    Exam:   GEN: NAD SKIN: Warm and dry EYES: No pallor or icterus ENT: MMM CV: Irregular rate and rhythm, tachycardic PULM: No wheezing or rales heard ABD: soft, obese, NT, +BS CNS: AAO x 3, non focal EXT: No edema or tenderness        Data Reviewed:   I have personally reviewed following labs and imaging studies:  Labs: Labs show the following:   Basic Metabolic Panel: Recent Labs  Lab 01/14/20 1829 01/15/20 0437 01/16/20 0403 01/17/20 0419 01/18/20 0415  NA 138 135 137 132* 132*  K 3.6 4.1 3.8 4.0 4.5  CL 98 99 98 97* 97*  CO2 29 25 26 26 26   GLUCOSE 108* 116* 201* 151* 243*  BUN 38* 43* 61* 77* 67*  CREATININE 1.33* 1.40* 1.78* 1.75* 1.33*  CALCIUM 8.9 9.0 8.7* 8.2* 8.4*  MG  --  2.3  --   --   --   PHOS  --  5.2*  --   --   --    GFR Estimated Creatinine Clearance: 45.2 mL/min (A) (by C-G formula based on SCr of 1.33 mg/dL (H)). Liver Function Tests: Recent Labs  Lab 01/14/20 1829 01/15/20 0437 01/16/20 0403 01/17/20 0419 01/18/20 0415  AST 30 34 39 32 32  ALT 18 17 20 19 20   ALKPHOS 95 88 87 69 66  BILITOT 0.9 0.7 0.8 0.7 0.5  PROT 8.3* 7.8 7.6 6.8 6.8  ALBUMIN 3.9 3.5 3.5 3.1* 3.3*   No results for input(s): LIPASE, AMYLASE in the last 168 hours. No results for input(s): AMMONIA in the last 168 hours. Coagulation profile No results for input(s): INR, PROTIME in the last 168 hours.  CBC: Recent Labs  Lab 01/14/20 1829 01/15/20 0437 01/16/20 0403 01/17/20 0419 01/18/20 0415  WBC 6.9 4.4 4.4 7.0 6.4   NEUTROABS 5.6 3.9 3.4 5.0 5.2  HGB 9.8* 9.6* 9.8* 8.6* 8.5*  HCT 31.9* 30.3* 30.9* 27.3* 26.4*  MCV 90.4 88.3 88.0 88.1 86.8  PLT 157 144* 168 162 153   Cardiac Enzymes: No results for input(s): CKTOTAL, CKMB, CKMBINDEX, TROPONINI in the last 168 hours. BNP (last 3 results) No results for input(s): PROBNP in the last 8760 hours. CBG: Recent Labs  Lab 01/17/20 1711 01/17/20 1934 01/18/20 0151 01/18/20 0847 01/18/20 1231  GLUCAP 204* 251* 296* 170* 131*   D-Dimer: No results for input(s): DDIMER in the last 72 hours. Hgb A1c: No results for input(s): HGBA1C in the last 72 hours. Lipid Profile: No results for input(s): CHOL, HDL, LDLCALC, TRIG, CHOLHDL, LDLDIRECT in the last 72 hours. Thyroid function studies: No results for input(s): TSH, T4TOTAL, T3FREE, THYROIDAB in the last 72 hours.  Invalid input(s): FREET3 Anemia work up: Recent Labs    01/17/20 0419 01/18/20 0415  FERRITIN 29 28   Sepsis Labs: Recent Labs  Lab 01/14/20 1929 01/15/20 0437 01/16/20 0403 01/17/20 0419 01/18/20 0415  PROCALCITON 0.41 0.39 0.46  --   --   WBC  --  4.4 4.4 7.0 6.4    Microbiology Recent Results (from the past 240 hour(s))  Resp Panel by RT-PCR (Flu A&B, Covid) Nasopharyngeal Swab  Status: Abnormal   Collection Time: 01/14/20  6:41 PM   Specimen: Nasopharyngeal Swab; Nasopharyngeal(NP) swabs in vial transport medium  Result Value Ref Range Status   SARS Coronavirus 2 by RT PCR POSITIVE (A) NEGATIVE Final    Comment: RESULT CALLED TO, READ BACK BY AND VERIFIED WITH: CANDICE KIO @1934  ON 01/14/20 SKL (NOTE) SARS-CoV-2 target nucleic acids are DETECTED.  The SARS-CoV-2 RNA is generally detectable in upper respiratory specimens during the acute phase of infection. Positive results are indicative of the presence of the identified virus, but do not rule out bacterial infection or co-infection with other pathogens not detected by the test. Clinical correlation with patient  history and other diagnostic information is necessary to determine patient infection status. The expected result is Negative.  Fact Sheet for Patients: EntrepreneurPulse.com.au  Fact Sheet for Healthcare Providers: IncredibleEmployment.be  This test is not yet approved or cleared by the [place] FDA and  has been authorized for detection and/or diagnosis of SARS-CoV-2 by FDA under an Emergency Use Authorization (EUA).  This EUA will remain in effect (meaning this test can be  used) for the duration of  the COVID-19 declaration under Section 564(b)(1) of the Act, 21 U.S.C. section 360bbb-3(b)(1), unless the authorization is terminated or revoked sooner.     Influenza A by PCR NEGATIVE NEGATIVE Final   Influenza B by PCR NEGATIVE NEGATIVE Final    Comment: (NOTE) The Xpert Xpress SARS-CoV-2/FLU/RSV plus assay is intended as an aid in the diagnosis of influenza from Nasopharyngeal swab specimens and should not be used as a sole basis for treatment. Nasal washings and aspirates are unacceptable for Xpert Xpress SARS-CoV-2/FLU/RSV testing.  Fact Sheet for Patients: EntrepreneurPulse.com.au  Fact Sheet for Healthcare Providers: IncredibleEmployment.be  This test is not yet approved or cleared by the [place] FDA and has been authorized for detection and/or diagnosis of SARS-CoV-2 by FDA under an Emergency Use Authorization (EUA). This EUA will remain in effect (meaning this test can be used) for the duration of the COVID-19 declaration under Section 564(b)(1) of the Act, 21 U.S.C. section 360bbb-3(b)(1), unless the authorization is terminated or revoked.  Performed at [facility], [address] [place], [place] [address]   CULTURE, BLOOD (ROUTINE X 2) w Reflex to ID Panel     Status: None (Preliminary result)   Collection Time: 01/14/20  9:37 PM   Specimen: BLOOD  Result Value Ref  Range Status   Specimen Description BLOOD BLOOD RIGHT FOREARM  Final   Special Requests   Final    BOTTLES DRAWN AEROBIC AND ANAEROBIC Blood Culture adequate volume   Culture   Final    NO GROWTH 4 DAYS Performed at [facility], [address] [place], [place] [address]    Report Status PENDING  Incomplete  CULTURE, BLOOD (ROUTINE X 2) w Reflex to ID Panel     Status: None (Preliminary result)   Collection Time: 01/14/20  9:37 PM   Specimen: BLOOD  Result Value Ref Range Status   Specimen Description BLOOD RIGHT ASSIST CONTROL  Final   Special Requests   Final    BOTTLES DRAWN AEROBIC AND ANAEROBIC Blood Culture adequate volume   Culture   Final    NO GROWTH 4 DAYS Performed at [facility], [address] [place], [place] [address]    Report Status PENDING  Incomplete    Procedures and diagnostic studies:  No results found.  LOS: 4 days   Shatonia Hoots  [facility]   Pager on [email or web address]. If 7PM-7AM, please contact night-coverage at [email or web address]     01/18/2020, 1:04 PM

## 2020-01-19 DIAGNOSIS — I4891 Unspecified atrial fibrillation: Secondary | ICD-10-CM | POA: Diagnosis not present

## 2020-01-19 DIAGNOSIS — U071 COVID-19: Secondary | ICD-10-CM | POA: Diagnosis not present

## 2020-01-19 DIAGNOSIS — N179 Acute kidney failure, unspecified: Secondary | ICD-10-CM | POA: Diagnosis not present

## 2020-01-19 DIAGNOSIS — I5032 Chronic diastolic (congestive) heart failure: Secondary | ICD-10-CM | POA: Diagnosis not present

## 2020-01-19 LAB — CBC WITH DIFFERENTIAL/PLATELET
Abs Immature Granulocytes: 0.04 10*3/uL (ref 0.00–0.07)
Basophils Absolute: 0 10*3/uL (ref 0.0–0.1)
Basophils Relative: 0 %
Eosinophils Absolute: 0 10*3/uL (ref 0.0–0.5)
Eosinophils Relative: 0 %
HCT: 25.5 % — ABNORMAL LOW (ref 39.0–52.0)
Hemoglobin: 8 g/dL — ABNORMAL LOW (ref 13.0–17.0)
Immature Granulocytes: 1 %
Lymphocytes Relative: 12 %
Lymphs Abs: 0.9 10*3/uL (ref 0.7–4.0)
MCH: 28 pg (ref 26.0–34.0)
MCHC: 31.4 g/dL (ref 30.0–36.0)
MCV: 89.2 fL (ref 80.0–100.0)
Monocytes Absolute: 0.8 10*3/uL (ref 0.1–1.0)
Monocytes Relative: 11 %
Neutro Abs: 5.4 10*3/uL (ref 1.7–7.7)
Neutrophils Relative %: 76 %
Platelets: 164 10*3/uL (ref 150–400)
RBC: 2.86 MIL/uL — ABNORMAL LOW (ref 4.22–5.81)
RDW: 15.2 % (ref 11.5–15.5)
WBC: 7.1 10*3/uL (ref 4.0–10.5)
nRBC: 0 % (ref 0.0–0.2)

## 2020-01-19 LAB — COMPREHENSIVE METABOLIC PANEL
ALT: 20 U/L (ref 0–44)
AST: 29 U/L (ref 15–41)
Albumin: 3.3 g/dL — ABNORMAL LOW (ref 3.5–5.0)
Alkaline Phosphatase: 62 U/L (ref 38–126)
Anion gap: 7 (ref 5–15)
BUN: 67 mg/dL — ABNORMAL HIGH (ref 8–23)
CO2: 26 mmol/L (ref 22–32)
Calcium: 8.6 mg/dL — ABNORMAL LOW (ref 8.9–10.3)
Chloride: 101 mmol/L (ref 98–111)
Creatinine, Ser: 1.46 mg/dL — ABNORMAL HIGH (ref 0.61–1.24)
GFR, Estimated: 48 mL/min — ABNORMAL LOW (ref >60)
Glucose, Bld: 210 mg/dL — ABNORMAL HIGH (ref 70–99)
Potassium: 4.8 mmol/L (ref 3.5–5.1)
Sodium: 134 mmol/L — ABNORMAL LOW (ref 135–145)
Total Bilirubin: 0.8 mg/dL (ref 0.3–1.2)
Total Protein: 7 g/dL (ref 6.5–8.1)

## 2020-01-19 LAB — GLUCOSE, CAPILLARY
Glucose-Capillary: 165 mg/dL — ABNORMAL HIGH (ref 70–99)
Glucose-Capillary: 106 mg/dL — ABNORMAL HIGH (ref 70–99)
Glucose-Capillary: 237 mg/dL — ABNORMAL HIGH (ref 70–99)
Glucose-Capillary: 236 mg/dL — ABNORMAL HIGH (ref 70–99)

## 2020-01-19 LAB — FIBRIN DERIVATIVES D-DIMER (ARMC ONLY): Fibrin derivatives D-dimer (ARMC): 253.19 ng/mL (FEU) (ref 0.00–499.00)

## 2020-01-19 LAB — CULTURE, BLOOD (ROUTINE X 2)
Culture: NO GROWTH
Culture: NO GROWTH
Special Requests: ADEQUATE
Special Requests: ADEQUATE

## 2020-01-19 LAB — MAGNESIUM: Magnesium: 2.9 mg/dL — ABNORMAL HIGH (ref 1.7–2.4)

## 2020-01-19 LAB — FERRITIN: Ferritin: 25 ng/mL (ref 24–336)

## 2020-01-19 LAB — C-REACTIVE PROTEIN: CRP: 0.8 mg/dL (ref <1.0)

## 2020-01-19 MED ORDER — TORSEMIDE 20 MG PO TABS
10.0000 mg | ORAL_TABLET | Freq: Every day | ORAL | Status: DC
  Administered 2020-01-19: 10:00:00 10 mg via ORAL
  Filled 2020-01-19: qty 1

## 2020-01-19 MED ORDER — PREDNISONE 20 MG PO TABS
30.0000 mg | ORAL_TABLET | Freq: Every day | ORAL | Status: DC
  Administered 2020-01-20 – 2020-01-21 (×2): 30 mg via ORAL
  Filled 2020-01-19 (×2): qty 1

## 2020-01-19 NOTE — Evaluation (Signed)
Occupational Therapy Evaluation Patient Details Name: Derek Shelton MRN: [id number] DOB: Apr 11, 1940 Today's Date: 01/19/2020    History of Present Illness  Pt is an 79 y/o M admitted on 01/14/20 after presenting to ED with 3 day progressive cough, SOB, fever, lethargy & loss of appetite. Household contacts tested (+) for [place]. Pt being treated for COVID 19 with acute respiratory disease, possible bacterial PNA, and acute hypoxic respiratory failure.  PMH: chronic diastolic CHF, pulmonary arterial HTN, CVA, diabetes, a-fib on eliquis, CKD stage 3a, chronic anemia, dementia, hypothyroidism, CAD   Clinical Impression   Mr Nyce was seen for OT/PT co-evaluation this date. Due to language barrier, [employer] Interpreter was attempted to use during conversation (attempted x2 to use video and audio, technical difficulties prohibiting use of interpreter t/o and pt communicating with limited english and object presentation). Per chart review, PTA pt required assist for ADLs and RW use. Pt lives c wife who provides assistance. Pt presents to acute OT demonstrating impaired ADL performance and functional mobility 2/2 decreased LB access, functional strength/balance deficits, and decreased activity tolerance.  Pt currently requires CGA + RW for toilet t/f, perihygiene, and hand washing standing sink side. MAX A don B socks seated EOB. SpO2 100% on RA. Pt would benefit from skilled OT to address noted impairments and functional limitations (see below for any additional details) in order to maximize safety and independence while minimizing falls risk and caregiver burden. Upon hospital discharge, recommend HHOT to maximize pt safety and return to functional independence during meaningful occupations of daily life.     Follow Up Recommendations  Home health OT    Equipment Recommendations  None recommended by OT    Recommendations for Other Services       Precautions / Restrictions  Precautions Precautions: Fall Restrictions Weight Bearing Restrictions: No      Mobility Bed Mobility Overal bed mobility: Needs Assistance Bed Mobility: Supine to Sit     Supine to sit: Supervision;HOB elevated          Transfers Overall transfer level: Needs assistance Equipment used: Rolling walker (2 wheeled) Transfers: Sit to/from Stand Sit to Stand: Min guard         General transfer comment: pt pulls to stand with BUE on RW vs pushing with 1UE to stand    Balance Overall balance assessment: Needs assistance Sitting-balance support: Feet supported;No upper extremity supported Sitting balance-Leahy Scale: Good Sitting balance - Comments: supervision sitting EOB & on toilet                                   ADL either performed or assessed with clinical judgement   ADL Overall ADL's : Needs assistance/impaired                                       General ADL Comments: CGA + RW for toilet t/f, perihygiene, and hand washing standing sink side. MAX A don B socks seated EOB                  Pertinent Vitals/Pain Pain Assessment: Faces Faces Pain Scale: No hurt     Hand Dominance     Extremity/Trunk Assessment Upper Extremity Assessment Upper Extremity Assessment: Generalized weakness   Lower Extremity Assessment Lower Extremity Assessment: Generalized weakness       Communication  Communication Communication: Prefers language other than English (urdu)   Cognition Arousal/Alertness: Awake/alert Behavior During Therapy: WFL for tasks assessed/performed Overall Cognitive Status: Within Functional Limits for tasks assessed                                     General Comments  on room air, SPO2 = 100%, HR = 114 bpm    Exercises Exercises: Other exercises Other Exercises Other Exercises: Pt educated re: OT role, DME recs, d/c recs, falls prevention Other Exercises: LBD, toileting, sup>sit,  sit<>stand, sitting/standing balance/tolerance   Shoulder Instructions      Home Living Family/patient expects to be discharged to:: Private residence Living Arrangements: Spouse/significant other Available Help at Discharge: Family;Available 24 hours/day Type of Home: House Home Access: Stairs to enter [employer] of Steps: 4 STE Entrance Stairs-Rails: Left Home Layout: One level               Home Equipment: Walker - 2 wheels;Wheelchair - manual;Cane - single point   Additional Comments: information gathered per chart review       Prior Functioning/Environment Level of Independence: Needs assistance  Gait / Transfers Assistance Needed: Per chart, pt intermittently using RW at home. ADL's / Homemaking Assistance Needed: Per chart review, pt requires assist with most ADL management including bathing, dressing, and toileting. Wife provides assist            OT Problem List: Decreased strength;Decreased range of motion;Decreased activity tolerance;Impaired balance (sitting and/or standing)      OT Treatment/Interventions: Self-care/ADL training;Therapeutic exercise;Energy conservation;DME and/or AE instruction;Therapeutic activities;Patient/family education;Balance training    OT Goals(Current goals can be found in the care plan section) Acute Rehab OT Goals Patient Stated Goal: none stated OT Goal Formulation: With patient Time For Goal Achievement: 02/02/20 Potential to Achieve Goals: Good ADL Goals Pt Will Perform Grooming: with modified independence;standing Pt Will Perform Lower Body Dressing: with min assist;sit to/from stand;with caregiver independent in assisting (c LRAD PRN) Pt Will Transfer to Toilet: with modified independence;ambulating;regular height toilet (c LRAD PRN)  OT Frequency: Min 1X/week           Co-evaluation PT/OT/SLP Co-Evaluation/Treatment: Yes Reason for Co-Treatment: Necessary to address cognition/behavior during functional  activity;To address functional/ADL transfers PT goals addressed during session: Mobility/safety with mobility OT goals addressed during session: ADL's and self-care      AM-PAC OT "6 Clicks" Daily Activity     Outcome Measure Help from another person eating meals?: None Help from another person taking care of personal grooming?: A Little Help from another person toileting, which includes using toliet, bedpan, or urinal?: A Little Help from another person bathing (including washing, rinsing, drying)?: A Little Help from another person to put on and taking off regular upper body clothing?: None Help from another person to put on and taking off regular lower body clothing?: A Lot 6 Click Score: 19   End of Session Equipment Utilized During Treatment: Rolling walker Nurse Communication: Mobility status  Activity Tolerance: Patient tolerated treatment well Patient left: in chair;with call bell/phone within reach;with chair alarm set  OT Visit Diagnosis: Other abnormalities of gait and mobility (R26.89)                Time: [id number] OT Time Calculation (min): 38 min Charges:  OT General Charges $OT Visit: 1 Visit OT Evaluation $OT Eval Moderate Complexity: 1 Mod OT Treatments $Self Care/Home Management : 8-22  mins  Dessie Coma, M.S. OTR/L  01/19/20, 1:33 PM  ascom [phone]

## 2020-01-19 NOTE — Progress Notes (Addendum)
Progress Note    Derek Shelton  [id number] DOB: 1940/05/11  DOA: 01/14/2020 PCP: Gladstone Lighter, MD      Brief Narrative:    Medical records reviewed and are as summarized below:  Derek Shelton is a 79 y.o. male with medical history significant for chronic diastolic CHF, pulmonary arterial hypertension, history of CVA, diabetes, atrial fibrillation on Eliquis, chronic kidney disease stage IIIa, chronic anemia, dementia, hypothyroidism, and CAD, now presenting to the emergency department with 3 days of progressive cough, shortness of breath, fevers, lethargy, and loss of appetite.  Household contacts have tested positive for COVID-19.   Reportedly, he was hypoxic at home but he was not hypoxic in the hospital.  His son said that he has intermittent hypoxia from not using CPAP at home and he attributed hypoxia at home to not using the CPAP.  Initial chest x-ray did not show any evidence of pneumonia.  His son requested that patient be treated with monoclonal antibody infusion so he was given bamlanivimab and etesevimab infusion.  He had been started on IV dexamethasone and IV remdesivir when he was initially admitted but this was discontinued.  However, repeat chest x-ray showed changes suggestive of COVID-19 pneumonia.  His son requested treatment with IV remdesivir and steroids which was done.  He was also seen in consultation by the pulmonologist.  He has CKD stage IIIa.  He developed acute kidney injury so torsemide was discontinued and he was given IV fluids.  Creatinine has improved close to baseline.  He also had mild COPD exacerbation that was treated with bronchodilators and prednisone.    Assessment/Plan:   Principal Problem:   Acute respiratory disease due to COVID-19 virus Active Problems:   Chronic diastolic heart failure (HCC)   Atrial fibrillation with RVR (HCC)   AKI (acute kidney injury) ([place])   Coronary artery disease   Essential hypertension    Normocytic anemia   Dementia without behavioral disturbance (HCC)   History of CVA (cerebrovascular accident)   Chronic kidney disease, stage 3a ([place])   Type II diabetes mellitus with renal manifestations (HCC)   COPD (chronic obstructive pulmonary disease) (HCC)   Hypothyroidism   Body mass index is 34.36 kg/m.  (Obesity)    COVID-19 pneumonia:  He is tolerating room air.  S/p with bamlanivimab and etesevimab infusion on 01/16/2020. Continue prednisone and IV remdesivir.  Plan to complete IV remdesivir tomorrow. Follow-up with pulmonologist  Atrial fibrillation with RVR  He still on IV Cardizem infusion at 12.5 mg/h.  Taper off as able. Continue metoprolol at 50 mg twice daily.  Continue Eliquis. - CHADS-VASc is 100 (age x2, CVA x2, DM, HTN, CHF)  COPD  with mild exacerbation. Continue prednisone and bronchodilators  Chronic diastolic CHF; PAH   Resume torsemide at 10 mg daily given recent AKI (he takes 40 mg of torsemide at home).  Continue sildenafil.   AKI on CKD IIIa  Creatinine is stable.  Monitor BMP.  Insulin-dependent DM  - A1c was 6.1% in October 2021  Continue Lantus at 15 units nightly.  NovoLog as needed for hyperglycemia.    CAD; elevated troponin  Mildly elevated troponin is likely due to demand ischemia. Continue metoprolol and Eliquis.  Hypothyroidism   Continue Synthroid    Dementia  Continue supportive care  History of stroke On Eliquis   I called his son, Dr. Linus Mako, but there was no response.  I left a voice message.      Diet  Order            Diet heart healthy/carb modified Room service appropriate? Yes; Fluid consistency: Thin  Diet effective now                    Consultants:  None  Procedures:  None    Medications:   . apixaban  5 mg Oral BID  . atorvastatin  40 mg Oral q1800  . donepezil  5 mg Oral QHS  . DULoxetine  60 mg Oral Daily  . ferrous sulfate  325 mg Oral Daily  . finasteride  5 mg Oral  Daily  . insulin aspart  0-5 Units Subcutaneous QHS  . insulin aspart  0-9 Units Subcutaneous TID WC  . insulin glargine  15 Units Subcutaneous QHS  . Ipratropium-Albuterol  1 puff Inhalation Q6H  . levothyroxine  112 mcg Oral Q0600  . linagliptin  5 mg Oral Daily  . metoprolol tartrate  50 mg Oral BID  . mometasone-formoterol  2 puff Inhalation BID  . montelukast  10 mg Oral QHS  . pantoprazole  40 mg Oral Daily  . polyethylene glycol  17 g Oral Daily  . potassium chloride SA  20 mEq Oral Daily  . predniSONE  40 mg Oral Daily  . sildenafil  20 mg Oral TID  . tamsulosin  0.4 mg Oral Daily  . tiotropium  18 mcg Inhalation Daily  . torsemide  10 mg Oral Daily   Continuous Infusions: . sodium chloride 250 mL (01/15/20 1020)  . sodium chloride Stopped (01/18/20 1317)  . diltiazem (CARDIZEM) infusion 10 mg/hr (01/19/20 1011)     Anti-infectives (From admission, onward)   Start     Dose/Rate Route Frequency Ordered Stop   01/17/20 1215  remdesivir 100 mg in sodium chloride 0.9 % 100 mL IVPB        100 mg 200 mL/hr over 30 Minutes Intravenous Daily 01/17/20 1126 01/19/20 1030   01/15/20 1000  remdesivir 100 mg in sodium chloride 0.9 % 100 mL IVPB  Status:  Discontinued       "Followed by" Linked Group Details   100 mg 200 mL/hr over 30 Minutes Intravenous Daily 01/14/20 2101 01/16/20 0807   01/14/20 2200  remdesivir 200 mg in sodium chloride 0.9% 250 mL IVPB       "Followed by" Linked Group Details   200 mg 580 mL/hr over 30 Minutes Intravenous Once 01/14/20 2101 01/15/20 0106   01/14/20 2045  cefTRIAXone (ROCEPHIN) 2 g in sodium chloride 0.9 % 100 mL IVPB  Status:  Discontinued        2 g 200 mL/hr over 30 Minutes Intravenous Every 24 hours 01/14/20 2044 01/15/20 1542   01/14/20 2045  azithromycin (ZITHROMAX) 500 mg in sodium chloride 0.9 % 250 mL IVPB  Status:  Discontinued        500 mg 250 mL/hr over 60 Minutes Intravenous Every 24 hours 01/14/20 2044 01/15/20 1542              Family Communication/Anticipated D/C date and plan/Code Status   DVT prophylaxis:  apixaban (ELIQUIS) tablet 5 mg     Code Status: Full Code  Family Communication: None Disposition Plan:    Status is: Inpatient  Remains inpatient appropriate because:IV treatments appropriate due to intensity of illness or inability to take PO and Inpatient level of care appropriate due to severity of illness   Dispo: The patient is from: Home  Anticipated d/c is to: Home              Anticipated d/c date is: 1 day              Patient currently is not medically stable to d/c.           Subjective:   Interval events noted.  He complains of cough and he feels that mucus is stuck in his throat.  No shortness of breath or chest pain.  Objective:    Vitals:   01/19/20 0009 01/19/20 0407 01/19/20 0426 01/19/20 0946  BP: (!) 128/99 129/69  124/73  Pulse: 95 (!) 106  74  Resp: 16 16  18   Temp: 98.2 F (36.8 C) 97.8 F (36.6 C)  98.4 F (36.9 C)  TempSrc: Oral Oral    SpO2: 98% 97%  97%  Weight:   93.7 kg   Height:       No data found.   Intake/Output Summary (Last 24 hours) at 01/19/2020 1354 Last data filed at 01/19/2020 0504 Gross per 24 hour  Intake 563.17 ml  Output 650 ml  Net -86.83 ml   Filed Weights   01/17/20 0557 01/18/20 0251 01/19/20 0426  Weight: 91.6 kg 91.3 kg 93.7 kg    Exam:   GEN: NAD SKIN: Warm and dry EYES: No pallor or icterus ENT: MMM CV: Irregular rate and rhythm PULM: CTA B ABD: soft, obese, NT, +BS CNS: AAO x 3, non focal EXT: No edema or tenderness         Data Reviewed:   I have personally reviewed following labs and imaging studies:  Labs: Labs show the following:   Basic Metabolic Panel: Recent Labs  Lab 01/15/20 0437 01/16/20 0403 01/17/20 0419 01/18/20 0415 01/19/20 0317  NA 135 137 132* 132* 134*  K 4.1 3.8 4.0 4.5 4.8  CL 99 98 97* 97* 101  CO2 25 26 26 26 26   GLUCOSE 116*  201* 151* 243* 210*  BUN 43* 61* 77* 67* 67*  CREATININE 1.40* 1.78* 1.75* 1.33* 1.46*  CALCIUM 9.0 8.7* 8.2* 8.4* 8.6*  MG 2.3  --   --   --   --   PHOS 5.2*  --   --   --   --    GFR Estimated Creatinine Clearance: 41.8 mL/min (A) (by C-G formula based on SCr of 1.46 mg/dL (H)). Liver Function Tests: Recent Labs  Lab 01/15/20 0437 01/16/20 0403 01/17/20 0419 01/18/20 0415 01/19/20 0317  AST 34 39 32 32 29  ALT 17 20 19 20 20   ALKPHOS 88 87 69 66 62  BILITOT 0.7 0.8 0.7 0.5 0.8  PROT 7.8 7.6 6.8 6.8 7.0  ALBUMIN 3.5 3.5 3.1* 3.3* 3.3*   No results for input(s): LIPASE, AMYLASE in the last 168 hours. No results for input(s): AMMONIA in the last 168 hours. Coagulation profile No results for input(s): INR, PROTIME in the last 168 hours.  CBC: Recent Labs  Lab 01/15/20 0437 01/16/20 0403 01/17/20 0419 01/18/20 0415 01/19/20 0317  WBC 4.4 4.4 7.0 6.4 7.1  NEUTROABS 3.9 3.4 5.0 5.2 5.4  HGB 9.6* 9.8* 8.6* 8.5* 8.0*  HCT 30.3* 30.9* 27.3* 26.4* 25.5*  MCV 88.3 88.0 88.1 86.8 89.2  PLT 144* 168 162 153 164   Cardiac Enzymes: No results for input(s): CKTOTAL, CKMB, CKMBINDEX, TROPONINI in the last 168 hours. BNP (last 3 results) No results for input(s): PROBNP in the last 8760 hours. CBG: Recent Labs  Lab 01/18/20 1231 01/18/20 1540 01/18/20 2133 01/19/20 0944 01/19/20 1251  GLUCAP 131* 221* 230* 165* 106*   D-Dimer: No results for input(s): DDIMER in the last 72 hours. Hgb A1c: No results for input(s): HGBA1C in the last 72 hours. Lipid Profile: No results for input(s): CHOL, HDL, LDLCALC, TRIG, CHOLHDL, LDLDIRECT in the last 72 hours. Thyroid function studies: No results for input(s): TSH, T4TOTAL, T3FREE, THYROIDAB in the last 72 hours.  Invalid input(s): FREET3 Anemia work up: Recent Labs    01/18/20 0415 01/19/20 0317  FERRITIN 28 25   Sepsis Labs: Recent Labs  Lab 01/14/20 1929 01/15/20 0437 01/16/20 0403 01/17/20 0419 01/18/20 0415  01/19/20 0317  PROCALCITON 0.41 0.39 0.46  --   --   --   WBC  --  4.4 4.4 7.0 6.4 7.1    Microbiology Recent Results (from the past 240 hour(s))  Resp Panel by RT-PCR (Flu A&B, Covid) Nasopharyngeal Swab     Status: Abnormal   Collection Time: 01/14/20  6:41 PM   Specimen: Nasopharyngeal Swab; Nasopharyngeal(NP) swabs in vial transport medium  Result Value Ref Range Status   SARS Coronavirus 2 by RT PCR POSITIVE (A) NEGATIVE Final    Comment: RESULT CALLED TO, READ BACK BY AND VERIFIED WITH: CANDICE KIO @1934  ON 01/14/20 SKL (NOTE) SARS-CoV-2 target nucleic acids are DETECTED.  The SARS-CoV-2 RNA is generally detectable in upper respiratory specimens during the acute phase of infection. Positive results are indicative of the presence of the identified virus, but do not rule out bacterial infection or co-infection with other pathogens not detected by the test. Clinical correlation with patient history and other diagnostic information is necessary to determine patient infection status. The expected result is Negative.  Fact Sheet for Patients: EntrepreneurPulse.com.au  Fact Sheet for Healthcare Providers: IncredibleEmployment.be  This test is not yet approved or cleared by the [place] FDA and  has been authorized for detection and/or diagnosis of SARS-CoV-2 by FDA under an Emergency Use Authorization (EUA).  This EUA will remain in effect (meaning this test can be  used) for the duration of  the COVID-19 declaration under Section 564(b)(1) of the Act, 21 U.S.C. section 360bbb-3(b)(1), unless the authorization is terminated or revoked sooner.     Influenza A by PCR NEGATIVE NEGATIVE Final   Influenza B by PCR NEGATIVE NEGATIVE Final    Comment: (NOTE) The Xpert Xpress SARS-CoV-2/FLU/RSV plus assay is intended as an aid in the diagnosis of influenza from Nasopharyngeal swab specimens and should not be used as a sole basis for  treatment. Nasal washings and aspirates are unacceptable for Xpert Xpress SARS-CoV-2/FLU/RSV testing.  Fact Sheet for Patients: EntrepreneurPulse.com.au  Fact Sheet for Healthcare Providers: IncredibleEmployment.be  This test is not yet approved or cleared by the [place] FDA and has been authorized for detection and/or diagnosis of SARS-CoV-2 by FDA under an Emergency Use Authorization (EUA). This EUA will remain in effect (meaning this test can be used) for the duration of the COVID-19 declaration under Section 564(b)(1) of the Act, 21 U.S.C. section 360bbb-3(b)(1), unless the authorization is terminated or revoked.  Performed at [facility], [address] [place], [place] [address]   CULTURE, BLOOD (ROUTINE X 2) w Reflex to ID Panel     Status: None   Collection Time: 01/14/20  9:37 PM   Specimen: BLOOD  Result Value Ref Range Status   Specimen Description BLOOD BLOOD RIGHT FOREARM  Final   Special Requests   Final  BOTTLES DRAWN AEROBIC AND ANAEROBIC Blood Culture adequate volume   Culture   Final    NO GROWTH 5 DAYS Performed at [facility], [address] [place], [place] [address]    Report Status 01/19/2020 FINAL  Final  CULTURE, BLOOD (ROUTINE X 2) w Reflex to ID Panel     Status: None   Collection Time: 01/14/20  9:37 PM   Specimen: BLOOD  Result Value Ref Range Status   Specimen Description BLOOD RIGHT ASSIST CONTROL  Final   Special Requests   Final    BOTTLES DRAWN AEROBIC AND ANAEROBIC Blood Culture adequate volume   Culture   Final    NO GROWTH 5 DAYS Performed at [facility], [address], [place], [place] [address]    Report Status 01/19/2020 FINAL  Final    Procedures and diagnostic studies:  No results found.             LOS: 5 days   Donicia Druck  [facility]   Pager on [email or web address]. If 7PM-7AM, please contact night-coverage at  [email or web address]     01/19/2020, 1:54 PM

## 2020-01-19 NOTE — Progress Notes (Signed)
Pulmonary Medicine          Date: 01/19/2020,   MRN# [id number] Derek Shelton January 31, 1940     AdmissionWeight: 88 kg                 CurrentWeight: 93.7 kg   Referring physician: Dr Mal [name]   CHIEF COMPLAINT:   Acute on chronic hypoxemic respiratory failure due to Derek Shelton is a 79 y.o. male with medical history significant for chronic diastolic CHF, pulmonary arterial hypertension, history of CVA, diabetes, atrial fibrillation on Eliquis, chronic kidney disease stage IIIa, chronic anemia, dementia, hypothyroidism, and CAD, now presenting to the emergency department with 3 days of progressive cough, shortness of breath, fevers, lethargy, and loss of appetite.  Household contacts have tested positive for COVID-19.  Patient has not had any chest pain, abdominal pain, diarrhea, or vomiting.  No recent leg swelling or tenderness.  No hemoptysis.  Patient's son at the bedside assists with the history and notes that the patient has been monitoring her O2 saturations at home and has been dropping into the mid 80s with minimal exertion.  Upon arrival to the ED, patient is found to be febrile to 38.1 C, saturating mid 90s while at rest, mildly tachypneic, tachycardic as high as 150 in atrial fibrillation, and with blood pressure 118/90.  EKG features atrial fibrillation with RVR, rate 128.  Chest x-ray notable for cardiomegaly and mild vascular congestion.  Chemistry panel with creatinine 1.33.  CBC notable for hemoglobin of 9.8, similar to last month.  High-sensitivity troponin is slightly elevated and BNP is 300.  Blood cultures were ordered from the emergency department and the patient was started on remdesivir, Solu-Medrol, Rocephin, and azithromycin. Due to patient complex and comorbid status family requested pulmonary consultation for additional input.   01/18/20- patient improved.  He is on room air.  Son at bedside.  For PT/OT.    01/19/20- patient is doing well.  He was able to walk in room with physical therapy.  I spke to son today and went over his care plan and current condition. Anticipate d/c after end of Vecluri protocol - today is day 4. CBC and BMP stable.   PAST MEDICAL HISTORY   Past Medical History:  Diagnosis Date  . Anemia   . Asthma   . CHF (congestive heart failure) ([place])   . COPD (chronic obstructive pulmonary disease) ([place])   . Coronary artery disease   . Dementia ([place])    Per son's report  . Diabetes mellitus without complication ([place])   . Dysrhythmia    Atrial Fibrillation  . GERD (gastroesophageal reflux disease)   . GI bleed   . Hyperlipidemia   . Hypertension   . Hypothyroidism   . Shortness of breath dyspnea   . Sleep apnea   . Stroke ([place])   . Thyroid disease      SURGICAL HISTORY   Past Surgical History:  Procedure Laterality Date  . CARDIAC CATHETERIZATION    . COLONOSCOPY N/A 08/10/2014   Procedure: COLONOSCOPY;  Surgeon: Manya Silvas, MD;  Location: [facility] ENDOSCOPY;  Service: Endoscopy;  Laterality: N/A;  . COLONOSCOPY WITH PROPOFOL N/A 04/05/2016   Procedure: COLONOSCOPY WITH PROPOFOL;  Surgeon: Lollie Sails, MD;  Location: [facility] END[facility]COPY;  Service: Endoscopy;  Laterality: N/A;  . COLONOSCOPY WITH PROPOFOL N/A 07/10/2019   Procedure: COLONOSCOPY WITH PROPOFOL;  Surgeon: Lucilla Lame, MD;  Location: [facility] ENDOSCOPY;  Service: Endoscopy;  Laterality: N/A;  . ESOPHAGOGASTRODUODENOSCOPY N/A 08/08/2014   Procedure: ESOPHAGOGASTRODUODENOSCOPY (EGD);  Surgeon: Manya Silvas, MD;  Location: [facility] ENDOSCOPY;  Service: Endoscopy;  Laterality: N/A;  plan for early afternoon  . ESOPHAGOGASTRODUODENOSCOPY (EGD) WITH PROPOFOL N/A 07/10/2019   Procedure: ESOPHAGOGASTRODUODENOSCOPY (EGD) WITH PROPOFOL;  Surgeon: Lucilla Lame, MD;  Location: [facility] ENDOSCOPY;  Service: Endoscopy;  Laterality: N/A;  . EYE SURGERY    . GIVENS CAPSULE STUDY N/A 08/12/2014   Procedure: GIVENS CAPSULE  STUDY;  Surgeon: Manya Silvas, MD;  Location: [facility] ENDOSCOPY;  Service: Endoscopy;  Laterality: N/A;  . rectal fistula N/A      FAMILY HISTORY   Family History  Problem Relation Age of Onset  . Diabetes Mother      SOCIAL HISTORY   Social History   Tobacco Use  . Smoking status: Former Smoker    Quit date: 2000    Years since quitting: 22.0  . Smokeless tobacco: Former Systems developer    Types: Secondary school teacher  . Vaping Use: Never used  Substance Use Topics  . Alcohol use: No  . Drug use: No     MEDICATIONS    Home Medication:    Current Medication:  Current Facility-Administered Medications:  .  0.9 %  sodium chloride infusion, 250 mL, Intravenous, PRN, Opyd, Ilene Qua, MD, Last Rate: 10 mL/hr at 01/15/20 1020, 250 mL at 01/15/20 1020 .  0.9 %  sodium chloride infusion, , Intravenous, PRN, Jennye Boroughs, MD, Stopped at 01/18/20 1317 .  acetaminophen (TYLENOL) tablet 650 mg, 650 mg, Oral, Q6H PRN, Opyd, Ilene Qua, MD, 650 mg at 01/16/20 2120 .  apixaban (ELIQUIS) tablet 5 mg, 5 mg, Oral, BID, Jennye Boroughs, MD, 5 mg at 01/19/20 0953 .  atorvastatin (LIPITOR) tablet 40 mg, 40 mg, Oral, q1800, Jennye Boroughs, MD, 40 mg at 01/18/20 2148 .  chlorpheniramine-HYDROcodone (TUSSIONEX) 10-8 MG/5ML suspension 5 mL, 5 mL, Oral, Q12H PRN, Opyd, Ilene Qua, MD, 5 mL at 01/18/20 1313 .  diltiazem (CARDIZEM) 125 mg in dextrose 5% 125 mL (1 mg/mL) infusion, 5-15 mg/hr, Intravenous, Titrated, Jennye Boroughs, MD, Last Rate: 10 mL/hr at 01/19/20 1011, 10 mg/hr at 01/19/20 1011 .  donepezil (ARICEPT) tablet 5 mg, 5 mg, Oral, QHS, Jennye Boroughs, MD, 5 mg at 01/18/20 2148 .  DULoxetine (CYMBALTA) DR capsule 60 mg, 60 mg, Oral, Daily, Jennye Boroughs, MD, 60 mg at 01/19/20 0952 .  EPINEPHrine (EPI-PEN) injection 0.3 mg, 0.3 mg, Intramuscular, Once PRN, Jennye Boroughs, MD .  ferrous sulfate tablet 325 mg, 325 mg, Oral, Daily, Jennye Boroughs, MD, 325 mg at 01/19/20 0953 .  finasteride (PROSCAR)  tablet 5 mg, 5 mg, Oral, Daily, Jennye Boroughs, MD, 5 mg at 01/19/20 0952 .  guaiFENesin-dextromethorphan (ROBITUSSIN DM) 100-10 MG/5ML syrup 10 mL, 10 mL, Oral, Q4H PRN, Opyd, Ilene Qua, MD, 10 mL at 01/19/20 1000 .  insulin aspart (novoLOG) injection 0-5 Units, 0-5 Units, Subcutaneous, QHS, Opyd, Ilene Qua, MD, 2 Units at 01/18/20 2149 .  insulin aspart (novoLOG) injection 0-9 Units, 0-9 Units, Subcutaneous, TID WC, Opyd, Ilene Qua, MD, 2 Units at 01/19/20 0954 .  insulin glargine (LANTUS) injection 15 Units, 15 Units, Subcutaneous, QHS, Jennye Boroughs, MD, 15 Units at 01/18/20 2149 .  Ipratropium-Albuterol (COMBIVENT) respimat 1 puff, 1 puff, Inhalation, Q6H, Opyd, Ilene Qua, MD, 1 puff at 01/19/20 0954 .  levothyroxine (SYNTHROID) tablet 112 mcg, 112 mcg, Oral, Q0600, Jennye Boroughs, MD, 112 mcg at 01/19/20 0532 .  linagliptin (TRADJENTA)  tablet 5 mg, 5 mg, Oral, Daily, Opyd, Ilene Qua, MD, 5 mg at 01/19/20 1001 .  metoprolol tartrate (LOPRESSOR) injection 2.5 mg, 2.5 mg, Intravenous, Q2H PRN, Opyd, Ilene Qua, MD, 2.5 mg at 01/15/20 1705 .  metoprolol tartrate (LOPRESSOR) tablet 50 mg, 50 mg, Oral, BID, Jennye Boroughs, MD, 50 mg at 01/19/20 0953 .  mometasone-formoterol (DULERA) 200-5 MCG/ACT inhaler 2 puff, 2 puff, Inhalation, BID, Opyd, Ilene Qua, MD, 2 puff at 01/19/20 1001 .  montelukast (SINGULAIR) tablet 10 mg, 10 mg, Oral, QHS, Opyd, Ilene Qua, MD, 10 mg at 01/18/20 2148 .  ondansetron (ZOFRAN) tablet 4 mg, 4 mg, Oral, Q6H PRN **OR** ondansetron (ZOFRAN) injection 4 mg, 4 mg, Intravenous, Q6H PRN, Opyd, Timothy S, MD .  pantoprazole (PROTONIX) EC tablet 40 mg, 40 mg, Oral, Daily, Opyd, Ilene Qua, MD, 40 mg at 01/19/20 0953 .  polyethylene glycol (MIRALAX / GLYCOLAX) packet 17 g, 17 g, Oral, Daily, [place], Bing Neighbors, NP, 17 g at 01/19/20 0957 .  potassium chloride SA (KLOR-CON) CR tablet 20 mEq, 20 mEq, Oral, Daily, Jennye Boroughs, MD, 20 mEq at 01/19/20 0953 .  predniSONE (DELTASONE)  tablet 40 mg, 40 mg, Oral, Daily, Jennye Boroughs, MD, 40 mg at 01/19/20 0952 .  sildenafil (REVATIO) tablet 20 mg, 20 mg, Oral, TID, Jennye Boroughs, MD, 20 mg at 01/19/20 1004 .  tamsulosin (FLOMAX) capsule 0.4 mg, 0.4 mg, Oral, Daily, Jennye Boroughs, MD, 0.4 mg at 01/19/20 0953 .  tiotropium (SPIRIVA) inhalation capsule ([facility] use ONLY) 18 mcg, 18 mcg, Inhalation, Daily, Opyd, Ilene Qua, MD, 18 mcg at 01/19/20 1003 .  torsemide (DEMADEX) tablet 10 mg, 10 mg, Oral, Daily, Jennye Boroughs, MD, 10 mg at 01/19/20 [id number]    ALLERGIES   Carvedilol, Penicillins, Aspirin, Aricept [donepezil hcl], and Catapres [clonidine hcl]     REVIEW OF SYSTEMS    Review of Systems:  Gen:  Denies  fever, sweats, chills weigh loss  HEENT: Denies blurred vision, double vision, ear pain, eye pain, hearing loss, nose bleeds, sore throat Cardiac:  No dizziness, chest pain or heaviness, chest tightness,edema Resp:   Denies cough or sputum porduction, shortness of breath,wheezing, hemoptysis,  Gi: Denies swallowing difficulty, stomach pain, nausea or vomiting, diarrhea, constipation, bowel incontinence Gu:  Denies bladder incontinence, burning urine Ext:   Denies Joint pain, stiffness or swelling Skin: Denies  skin rash, easy bruising or bleeding or hives Endoc:  Denies polyuria, polydipsia , polyphagia or weight change Psych:   Denies depression, insomnia or hallucinations   Other:  All other systems negative   VS: BP 124/73 (BP Location: Left Arm)   Pulse 74   Temp 98.4 F (36.9 C)   Resp 18   Ht 5\' 5"  (1.651 m)   Wt 93.7 kg   SpO2 97%   BMI 34.36 kg/m      PHYSICAL EXAM    GENERAL:NAD, no fevers, chills, no weakness no fatigue HEAD: Normocephalic, atraumatic.  EYES: Pupils equal, round, reactive to light. Extraocular muscles intact. No scleral icterus.  MOUTH: Moist mucosal membrane. Dentition intact. No abscess noted.  EAR, NOSE, THROAT: Clear without exudates. No external lesions.  NECK:  Supple. No thyromegaly. No nodules. No JVD.  PULMONARY: Mild rhonchi CARDIOVASCULAR: S1 and S2. Regular rate and rhythm. No murmurs, rubs, or gallops. No edema. Pedal pulses 2+ bilaterally.  GASTROINTESTINAL: Soft, nontender, nondistended. No masses. Positive bowel sounds. No hepatosplenomegaly.  MUSCULOSKELETAL: No swelling, clubbing, or edema. Range of motion full in all extremities.  NEUROLOGIC:  Cranial nerves II through XII are intact. No gross focal neurological deficits. Sensation intact. Reflexes intact.  SKIN: No ulceration, lesions, rashes, or cyanosis. Skin warm and dry. Turgor intact.  PSYCHIATRIC: Mood, affect within normal limits. The patient is awake, alert and oriented x 3. Insight, judgment intact.       IMAGING    DG Chest 2 View  Result Date: 01/14/2020 CLINICAL DATA:  Cough, difficulty breathing EXAM: CHEST - 2 VIEW COMPARISON:  08/06/2019 FINDINGS: Frontal and lateral enlargement the cardiac silhouette views of the chest demonstrate marked. There is mild central vascular congestion without airspace disease, effusion, or pneumothorax. Chronic elevation of the right hemidiaphragm. IMPRESSION: Enlarged cardiac silhouette with mild central vascular congestion. No acute airspace disease. Electronically Signed   By: Randa Ngo M.D.   On: 01/14/2020 18:19   DG Chest Port 1 View  Result Date: 01/16/2020 CLINICAL DATA:  Pneumonia EXAM: PORTABLE CHEST 1 VIEW COMPARISON:  Two days ago FINDINGS: Mild patchy haziness over both lungs. Cardiopericardial enlargement and vascular pedicle widening. No visible effusion or pneumothorax. IMPRESSION: 1. Hazy pulmonary opacity correlating with history of pneumonia. 2. Cardiomegaly. Electronically Signed   By: Monte Fantasia M.D.   On: 01/16/2020 05:58      ASSESSMENT/PLAN   Acute on chronic hypoxemic respiratory failure Acute COVID19 infection -Remdesevir antiviral - pharmacy protocol 5 d -vitamin C -zinc -pred 40 daily  >>>01/19/20-decreased to 30mg  -Self prone not necessary for this patient -encourage to use IS-patient able to take 600cc tidal volume>>>79ml 01/19/20 -s/p MAB infusion  -d/c hepatotoxic medications while on remdesevir -supportive care with ICU telemetry monitoring -PT/OT -12/26- patient did well during ambulation challenge -procalcitonin, CRP and ferritin trending -discussed care plan with both sons today they are agreeable to care plan and thankful for care   Additional chronic comorbid conditions  COPD- dulera and comvent plus steroids  Pulmonary hypertension - Sildenafil TID OSA- patient does not wish to use CPAP device  Obesity -improved CKD- followed outpatient by [facility]- hx of blood transfusion  Deconditioning - pt/ot      Thank you for allowing me to participate in the care of this patient.   Patient/Family are satisfied with care plan and all questions have been answered.  This document was prepared using Dragon voice recognition software and may include unintentional dictation errors.     Ottie Glazier, M.D.  Division of [place]

## 2020-01-19 NOTE — Evaluation (Signed)
Physical Therapy Evaluation Patient Details Name: Derek Shelton MRN: [id number] DOB: Feb 16, 1940 Today's Date: 01/19/2020   History of Present Illness  Pt is an 79 y/o M admitted on 01/14/20 after presenting to ED with 3 day progressive cough, SOB, fever, lethargy & loss of appetite. Household contacts tested (+) for [place]. Pt being treated for COVID 19 with acute respiratory disease, possible bacterial PNA, and acute hypoxic respiratory failure.  PMH: chronic diastolic CHF, pulmonary arterial HTN, CVA, diabetes, a-fib on eliquis, CKD stage 3a, chronic anemia, dementia, hypothyroidism, CAD  Clinical Impression  Pt seen for co-tx with OT on this date. Attempted to use stratus interpreter x 2 (video & audio) but due to technical difficulties interpreter services terminated the sessions, therefore communicated with pt via gestures. Pt completes supine>sit with supervision & hospital bed features, sit<>stand with CGA & ambulates bed>bathroom>recliner with RW & CGA. Pt requires extended time sitting on toilet but no LOB noted. At end of session pt left in recliner. Pt would benefit from continued skilled PT treatment to focus on strengthening, progress gait distances, & for balance training.     Follow Up Recommendations Home health PT;Supervision for mobility/OOB    Equipment Recommendations  None recommended by PT (pt already has RW)    Recommendations for Other Services       Precautions / Restrictions Precautions Precautions: Fall Restrictions Weight Bearing Restrictions: No      Mobility  Bed Mobility Overal bed mobility: Needs Assistance Bed Mobility: Supine to Sit     Supine to sit: Supervision;HOB elevated          Transfers Overall transfer level: Needs assistance Equipment used: Rolling walker (2 wheeled) Transfers: Sit to/from Stand Sit to Stand: Min guard         General transfer comment: pt pulls to stand with BUE on RW vs pushing with 1UE to  stand  Ambulation/Gait Ambulation/Gait assistance: Min guard Gait Distance (Feet): 10 Feet Assistive device: Rolling walker (2 wheeled)       General Gait Details: forward trunk lean on RW  Stairs            Wheelchair Mobility    Modified Rankin (Stroke Patients Only)       Balance Overall balance assessment: Needs assistance Sitting-balance support: Feet supported;No upper extremity supported Sitting balance-Leahy Scale: Good Sitting balance - Comments: supervision sitting EOB & on toilet                                     Pertinent Vitals/Pain Pain Assessment: Faces Faces Pain Scale: No hurt    Home Living Family/patient expects to be discharged to:: Private residence Living Arrangements: Spouse/significant other Available Help at Discharge: Family;Available 24 hours/day Type of Home: House Home Access: Stairs to enter Entrance Stairs-Rails: Left Entrance Stairs-Number of Steps: 4 STE Home Layout: One level Home Equipment: Walker - 2 wheels;Wheelchair - manual;Cane - single point Additional Comments: information gathered per chart review     Prior Function Level of Independence: Needs assistance   Gait / Transfers Assistance Needed: Per chart, pt intermittently using RW at home.           Hand Dominance        Extremity/Trunk Assessment                Communication   Communication: Prefers language other than English (Urdu)  Cognition Arousal/Alertness: Awake/alert Behavior During Therapy: [facility]  for tasks assessed/performed Overall Cognitive Status: Within Functional Limits for tasks assessed                                        General Comments General comments (skin integrity, edema, etc.): on room air, SPO2 = 100%, HR = 114 bpm    Exercises     Assessment/Plan    PT Assessment Patient needs continued PT services  PT Problem List Decreased strength;Decreased mobility;Decreased safety  awareness;Decreased coordination;Decreased knowledge of precautions;Decreased activity tolerance;Decreased balance;Decreased knowledge of use of DME;Cardiopulmonary status limiting activity;Obesity       PT Treatment Interventions DME instruction;Therapeutic activities;Gait training;Patient/family education;Stair training;Balance training;Functional mobility training;Neuromuscular re-education;Manual techniques;Therapeutic exercise    PT Goals (Current goals can be found in the Care Plan section)  Acute Rehab PT Goals Patient Stated Goal: none stated PT Goal Formulation: With patient Time For Goal Achievement: 02/02/20    Frequency Min 2X/week   Barriers to discharge        Co-evaluation               AM-PAC PT "6 Clicks" Mobility  Outcome Measure Help needed turning from your back to your side while in a flat bed without using bedrails?: A Little Help needed moving from lying on your back to sitting on the side of a flat bed without using bedrails?: A Little Help needed moving to and from a bed to a chair (including a wheelchair)?: A Little Help needed standing up from a chair using your arms (e.g., wheelchair or bedside chair)?: A Little Help needed to walk in hospital room?: A Little Help needed climbing 3-5 steps with a railing? : A Little 6 Click Score: 18    End of Session   Activity Tolerance: Patient tolerated treatment well Patient left: in chair;with chair alarm set;with call bell/phone within reach   PT Visit Diagnosis: Muscle weakness (generalized) (M62.81);Unsteadiness on feet (R26.81)    Time: 1125-1200 PT Time Calculation (min) (ACUTE ONLY): 35 min   Charges:   PT Evaluation $PT Eval Low Complexity: 1 Low PT Treatments $Therapeutic Activity: 8-22 mins        Lavone Nian, PT, DPT 01/19/20, 12:54 PM   Waunita Schooner 01/19/2020, 12:52 PM

## 2020-01-20 DIAGNOSIS — I5032 Chronic diastolic (congestive) heart failure: Secondary | ICD-10-CM | POA: Diagnosis not present

## 2020-01-20 DIAGNOSIS — I4891 Unspecified atrial fibrillation: Secondary | ICD-10-CM | POA: Diagnosis not present

## 2020-01-20 DIAGNOSIS — N179 Acute kidney failure, unspecified: Secondary | ICD-10-CM | POA: Diagnosis not present

## 2020-01-20 DIAGNOSIS — U071 COVID-19: Secondary | ICD-10-CM | POA: Diagnosis not present

## 2020-01-20 LAB — BASIC METABOLIC PANEL
Anion gap: 7 (ref 5–15)
BUN: 69 mg/dL — ABNORMAL HIGH (ref 8–23)
CO2: 27 mmol/L (ref 22–32)
Calcium: 8.8 mg/dL — ABNORMAL LOW (ref 8.9–10.3)
Chloride: 101 mmol/L (ref 98–111)
Creatinine, Ser: 1.52 mg/dL — ABNORMAL HIGH (ref 0.61–1.24)
GFR, Estimated: 46 mL/min — ABNORMAL LOW (ref >60)
Glucose, Bld: 159 mg/dL — ABNORMAL HIGH (ref 70–99)
Potassium: 5 mmol/L (ref 3.5–5.1)
Sodium: 135 mmol/L (ref 135–145)

## 2020-01-20 LAB — CBC WITH DIFFERENTIAL/PLATELET
Abs Immature Granulocytes: 0.07 10*3/uL (ref 0.00–0.07)
Basophils Absolute: 0 10*3/uL (ref 0.0–0.1)
Basophils Relative: 0 %
Eosinophils Absolute: 0 10*3/uL (ref 0.0–0.5)
Eosinophils Relative: 0 %
HCT: 24.6 % — ABNORMAL LOW (ref 39.0–52.0)
Hemoglobin: 7.6 g/dL — ABNORMAL LOW (ref 13.0–17.0)
Immature Granulocytes: 1 %
Lymphocytes Relative: 12 %
Lymphs Abs: 0.8 10*3/uL (ref 0.7–4.0)
MCH: 27.5 pg (ref 26.0–34.0)
MCHC: 30.9 g/dL (ref 30.0–36.0)
MCV: 89.1 fL (ref 80.0–100.0)
Monocytes Absolute: 0.6 10*3/uL (ref 0.1–1.0)
Monocytes Relative: 9 %
Neutro Abs: 5.4 10*3/uL (ref 1.7–7.7)
Neutrophils Relative %: 78 %
Platelets: 167 10*3/uL (ref 150–400)
RBC: 2.76 MIL/uL — ABNORMAL LOW (ref 4.22–5.81)
RDW: 15.5 % (ref 11.5–15.5)
WBC: 6.9 10*3/uL (ref 4.0–10.5)
nRBC: 0 % (ref 0.0–0.2)

## 2020-01-20 LAB — GLUCOSE, CAPILLARY
Glucose-Capillary: 128 mg/dL — ABNORMAL HIGH (ref 70–99)
Glucose-Capillary: 150 mg/dL — ABNORMAL HIGH (ref 70–99)
Glucose-Capillary: 214 mg/dL — ABNORMAL HIGH (ref 70–99)
Glucose-Capillary: 197 mg/dL — ABNORMAL HIGH (ref 70–99)

## 2020-01-20 LAB — PREPARE RBC (CROSSMATCH)

## 2020-01-20 MED ORDER — APIXABAN 2.5 MG PO TABS
2.5000 mg | ORAL_TABLET | Freq: Two times a day (BID) | ORAL | Status: DC
Start: 2020-01-20 — End: 2020-01-21
  Administered 2020-01-20 – 2020-01-21 (×2): 2.5 mg via ORAL
  Filled 2020-01-20 (×2): qty 1

## 2020-01-20 MED ORDER — SODIUM CHLORIDE 0.9% IV SOLUTION: Freq: Once | INTRAVENOUS | Status: AC

## 2020-01-20 MED ORDER — DILTIAZEM HCL ER COATED BEADS 120 MG PO CP24
120.0000 mg | ORAL_CAPSULE | Freq: Every day | ORAL | Status: DC
  Administered 2020-01-20 – 2020-01-21 (×2): 120 mg via ORAL
  Filled 2020-01-20 (×2): qty 1

## 2020-01-20 MED ORDER — FUROSEMIDE 10 MG/ML IJ SOLN
20.0000 mg | Freq: Once | INTRAMUSCULAR | Status: AC
  Administered 2020-01-20: 12:00:00 20 mg via INTRAVENOUS
  Filled 2020-01-20: qty 2

## 2020-01-20 NOTE — Progress Notes (Signed)
Progress Note    Derek Shelton  [id number] DOB: November 08, 1940  DOA: 01/14/2020 PCP: Gladstone Lighter, MD      Brief Narrative:    Medical records reviewed and are as summarized below:  Derek Shelton is a 79 y.o. male with medical history significant for chronic diastolic CHF, pulmonary arterial hypertension, history of CVA, diabetes, atrial fibrillation on Eliquis, chronic kidney disease stage IIIa, chronic anemia, dementia, hypothyroidism, and CAD, now presenting to the emergency department with 3 days of progressive cough, shortness of breath, fevers, lethargy, and loss of appetite.  Household contacts have tested positive for COVID-19.   Reportedly, he was hypoxic at home but he was not hypoxic in the hospital.  His son said that he has intermittent hypoxia from not using CPAP at home and he attributed hypoxia at home to not using the CPAP.  Initial chest x-ray did not show any evidence of pneumonia.  His son requested that patient be treated with monoclonal antibody infusion so he was given bamlanivimab and etesevimab infusion.  He had been started on IV dexamethasone and IV remdesivir when he was initially admitted but this was discontinued.  However, repeat chest x-ray showed changes suggestive of COVID-19 pneumonia.  His son requested treatment with IV remdesivir and steroids which was done.  He was also seen in consultation by the pulmonologist.  He has CKD stage IIIa.  He developed acute kidney injury so torsemide was discontinued and he was given IV fluids.  Creatinine has improved close to baseline.  He also had mild COPD exacerbation that was treated with bronchodilators and prednisone.    Assessment/Plan:   Principal Problem:   Acute respiratory disease due to COVID-19 virus Active Problems:   Chronic diastolic heart failure (HCC)   Atrial fibrillation with RVR (HCC)   AKI (acute kidney injury) ([place])   Coronary artery disease   Essential hypertension    Normocytic anemia   Dementia without behavioral disturbance (HCC)   History of CVA (cerebrovascular accident)   Chronic kidney disease, stage 3a ([place])   Type II diabetes mellitus with renal manifestations (HCC)   COPD (chronic obstructive pulmonary disease) (HCC)   Hypothyroidism   Body mass index is 33.68 kg/m.  (Obesity)    COVID-19 pneumonia:  He is tolerating room air.  S/p with bamlanivimab and etesevimab infusion on 01/16/2020. Completed IV remdesivir on 01/20/2020. Continue prednisone. Follow-up with pulmonologist  Atrial fibrillation with RVR  He is requiring IV Cardizem infusion but he has been tapered down to 5 mg/h.  Continue to taper off Cardizem infusion.  Start oral Cardizem for adequate rate control. Continue metoprolol and Eliquis. - CHADS-VASc is 77 (age x2, CVA x2, DM, HTN, CHF)   Worsening anemia in a patient with iron deficiency anemia/anemia of chronic disease Hemoglobin has dropped from 9.8 (on admission) to 7.6.  Transfused with 1 unit of packed red blood cells.  Monitor H&H.  This was discussed with the patient and his son, Dr. Linus Mako, and they are agreeable to blood transfusion.   COPD  with mild exacerbation. Continue prednisone and bronchodilators  Chronic diastolic CHF; [place]   Patient will be given 1 dose of IV Lasix (20 mg) posttransfusion to avoid fluid overload.  Hold torsemide for now.  Continue sildenafil.   AKI on CKD IIIa  Creatinine is trending up slowly.  Continue to monitor BMP.  Insulin-dependent DM  - A1c was 6.1% in October 2021  Continue Lantus at 15 units nightly.  NovoLog  as needed for hyperglycemia.    CAD; elevated troponin  Mildly elevated troponin is likely due to demand ischemia. Continue metoprolol and Eliquis.  Hypothyroidism   Continue Synthroid    Dementia  Continue supportive care  History of stroke On Eliquis  Plan to discharge home tomorrow.  Plan of care was discussed with her son, Dr.  Linus Mako.    Diet Order            Diet heart healthy/carb modified Room service appropriate? Yes; Fluid consistency: Thin  Diet effective now                    Consultants:  None  Procedures:  None    Medications:   . sodium chloride   Intravenous Once  . apixaban  2.5 mg Oral BID  . atorvastatin  40 mg Oral q1800  . diltiazem  120 mg Oral Daily  . donepezil  5 mg Oral QHS  . DULoxetine  60 mg Oral Daily  . ferrous sulfate  325 mg Oral Daily  . finasteride  5 mg Oral Daily  . furosemide  20 mg Intravenous Once  . insulin aspart  0-5 Units Subcutaneous QHS  . insulin aspart  0-9 Units Subcutaneous TID WC  . insulin glargine  15 Units Subcutaneous QHS  . Ipratropium-Albuterol  1 puff Inhalation Q6H  . levothyroxine  112 mcg Oral Q0600  . linagliptin  5 mg Oral Daily  . metoprolol tartrate  50 mg Oral BID  . mometasone-formoterol  2 puff Inhalation BID  . montelukast  10 mg Oral QHS  . pantoprazole  40 mg Oral Daily  . polyethylene glycol  17 g Oral Daily  . predniSONE  30 mg Oral Daily  . sildenafil  20 mg Oral TID  . tamsulosin  0.4 mg Oral Daily  . tiotropium  18 mcg Inhalation Daily   Continuous Infusions: . sodium chloride 250 mL (01/15/20 1020)  . sodium chloride Stopped (01/18/20 1317)  . diltiazem (CARDIZEM) infusion 5 mg/hr (01/20/20 [id number])     Anti-infectives (From admission, onward)   Start     Dose/Rate Route Frequency Ordered Stop   01/17/20 1215  remdesivir 100 mg in sodium chloride 0.9 % 100 mL IVPB        100 mg 200 mL/hr over 30 Minutes Intravenous Daily 01/17/20 1126 01/20/20 0724   01/15/20 1000  remdesivir 100 mg in sodium chloride 0.9 % 100 mL IVPB  Status:  Discontinued       "Followed by" Linked Group Details   100 mg 200 mL/hr over 30 Minutes Intravenous Daily 01/14/20 2101 01/16/20 0807   01/14/20 2200  remdesivir 200 mg in sodium chloride 0.9% 250 mL IVPB       "Followed by" Linked Group Details   200 mg 580 mL/hr over 30  Minutes Intravenous Once 01/14/20 2101 01/15/20 0106   01/14/20 2045  cefTRIAXone (ROCEPHIN) 2 g in sodium chloride 0.9 % 100 mL IVPB  Status:  Discontinued        2 g 200 mL/hr over 30 Minutes Intravenous Every 24 hours 01/14/20 2044 01/15/20 1542   01/14/20 2045  azithromycin (ZITHROMAX) 500 mg in sodium chloride 0.9 % 250 mL IVPB  Status:  Discontinued        500 mg 250 mL/hr over 60 Minutes Intravenous Every 24 hours 01/14/20 2044 01/15/20 1542             Family Communication/Anticipated D/C date and plan/Code Status  DVT prophylaxis:  apixaban (ELIQUIS) tablet 2.5 mg     Code Status: Full Code  Family Communication: Discussed with his son Disposition Plan:    Status is: Inpatient  Remains inpatient appropriate because:IV treatments appropriate due to intensity of illness or inability to take PO and Inpatient level of care appropriate due to severity of illness   Dispo: The patient is from: Home              Anticipated d/c is to: Home              Anticipated d/c date is: 1 day              Patient currently is not medically stable to d/c.           Subjective:   Interval events noted.c/o generalized weakness.  No shortness of breath or chest pain.  Objective:    Vitals:   01/19/20 0946 01/19/20 2007 01/20/20 0437 01/20/20 0814  BP: 124/73 101/77 112/82 118/89  Pulse: 74 100 (!) 101 98  Resp: 18 20 20 19   Temp: 98.4 F (36.9 C) 97.6 F (36.4 C) (!) 97.5 F (36.4 C) 97.8 F (36.6 C)  TempSrc:  Oral Oral Oral  SpO2: 97% 100% 98% 98%  Weight:   91.8 kg   Height:       No data found.   Intake/Output Summary (Last 24 hours) at 01/20/2020 1151 Last data filed at 01/20/2020 0439 Gross per 24 hour  Intake --  Output 2200 ml  Net -2200 ml   Filed Weights   01/18/20 0251 01/19/20 0426 01/20/20 0437  Weight: 91.3 kg 93.7 kg 91.8 kg    Exam:   GEN: NAD SKIN: Warm and dry EYES: Pale but anicteric ENT: MMM CV: Irregular rate and  rhythm, tachycardic PULM: No wheezing or rales. ABD: soft, ND, NT, +BS CNS: AAO x 2 (person and place), non focal EXT: No edema or tenderness        Data Reviewed:   I have personally reviewed following labs and imaging studies:  Labs: Labs show the following:   Basic Metabolic Panel: Recent Labs  Lab 01/15/20 0437 01/16/20 0403 01/17/20 0419 01/18/20 0415 01/19/20 0317 01/20/20 0340  NA 135 137 132* 132* 134* 135  K 4.1 3.8 4.0 4.5 4.8 5.0  CL 99 98 97* 97* 101 101  CO2 25 26 26 26 26 27   GLUCOSE 116* 201* 151* 243* 210* 159*  BUN 43* 61* 77* 67* 67* 69*  CREATININE 1.40* 1.78* 1.75* 1.33* 1.46* 1.52*  CALCIUM 9.0 8.7* 8.2* 8.4* 8.6* 8.8*  MG 2.3  --   --   --  2.9*  --   PHOS 5.2*  --   --   --   --   --    GFR Estimated Creatinine Clearance: 39.7 mL/min (A) (by C-G formula based on SCr of 1.52 mg/dL (H)). Liver Function Tests: Recent Labs  Lab 01/15/20 0437 01/16/20 0403 01/17/20 0419 01/18/20 0415 01/19/20 0317  AST 34 39 32 32 29  ALT 17 20 19 20 20   ALKPHOS 88 87 69 66 62  BILITOT 0.7 0.8 0.7 0.5 0.8  PROT 7.8 7.6 6.8 6.8 7.0  ALBUMIN 3.5 3.5 3.1* 3.3* 3.3*   No results for input(s): LIPASE, AMYLASE in the last 168 hours. No results for input(s): AMMONIA in the last 168 hours. Coagulation profile No results for input(s): INR, PROTIME in the last 168 hours.  CBC: Recent Labs  Lab  01/16/20 0403 01/17/20 0419 01/18/20 0415 01/19/20 0317 01/20/20 0340  WBC 4.4 7.0 6.4 7.1 6.9  NEUTROABS 3.4 5.0 5.2 5.4 5.4  HGB 9.8* 8.6* 8.5* 8.0* 7.6*  HCT 30.9* 27.3* 26.4* 25.5* 24.6*  MCV 88.0 88.1 86.8 89.2 89.1  PLT 168 162 153 164 167   Cardiac Enzymes: No results for input(s): CKTOTAL, CKMB, CKMBINDEX, TROPONINI in the last 168 hours. BNP (last 3 results) No results for input(s): PROBNP in the last 8760 hours. CBG: Recent Labs  Lab 01/19/20 0944 01/19/20 1251 01/19/20 1750 01/19/20 2011 01/20/20 0816  GLUCAP 165* 106* 237* 236* 128*    D-Dimer: No results for input(s): DDIMER in the last 72 hours. Hgb A1c: No results for input(s): HGBA1C in the last 72 hours. Lipid Profile: No results for input(s): CHOL, HDL, LDLCALC, TRIG, CHOLHDL, LDLDIRECT in the last 72 hours. Thyroid function studies: No results for input(s): TSH, T4TOTAL, T3FREE, THYROIDAB in the last 72 hours.  Invalid input(s): FREET3 Anemia work up: Recent Labs    01/18/20 0415 01/19/20 0317  FERRITIN 28 25   Sepsis Labs: Recent Labs  Lab 01/14/20 1929 01/15/20 0437 01/16/20 0403 01/17/20 0419 01/18/20 0415 01/19/20 0317 01/20/20 0340  PROCALCITON 0.41 0.39 0.46  --   --   --   --   WBC  --  4.4 4.4 7.0 6.4 7.1 6.9    Microbiology Recent Results (from the past 240 hour(s))  Resp Panel by RT-PCR (Flu A&B, Covid) Nasopharyngeal Swab     Status: Abnormal   Collection Time: 01/14/20  6:41 PM   Specimen: Nasopharyngeal Swab; Nasopharyngeal(NP) swabs in vial transport medium  Result Value Ref Range Status   SARS Coronavirus 2 by RT PCR POSITIVE (A) NEGATIVE Final    Comment: RESULT CALLED TO, READ BACK BY AND VERIFIED WITH: CANDICE KIO @1934  ON 01/14/20 SKL (NOTE) SARS-CoV-2 target nucleic acids are DETECTED.  The SARS-CoV-2 RNA is generally detectable in upper respiratory specimens during the acute phase of infection. Positive results are indicative of the presence of the identified virus, but do not rule out bacterial infection or co-infection with other pathogens not detected by the test. Clinical correlation with patient history and other diagnostic information is necessary to determine patient infection status. The expected result is Negative.  Fact Sheet for Patients: EntrepreneurPulse.com.au  Fact Sheet for Healthcare Providers: IncredibleEmployment.be  This test is not yet approved or cleared by the [place] FDA and  has been authorized for detection and/or diagnosis of SARS-CoV-2  by FDA under an Emergency Use Authorization (EUA).  This EUA will remain in effect (meaning this test can be  used) for the duration of  the COVID-19 declaration under Section 564(b)(1) of the Act, 21 U.S.C. section 360bbb-3(b)(1), unless the authorization is terminated or revoked sooner.     Influenza A by PCR NEGATIVE NEGATIVE Final   Influenza B by PCR NEGATIVE NEGATIVE Final    Comment: (NOTE) The Xpert Xpress SARS-CoV-2/FLU/RSV plus assay is intended as an aid in the diagnosis of influenza from Nasopharyngeal swab specimens and should not be used as a sole basis for treatment. Nasal washings and aspirates are unacceptable for Xpert Xpress SARS-CoV-2/FLU/RSV testing.  Fact Sheet for Patients: EntrepreneurPulse.com.au  Fact Sheet for Healthcare Providers: IncredibleEmployment.be  This test is not yet approved or cleared by the [place] FDA and has been authorized for detection and/or diagnosis of SARS-CoV-2 by FDA under an Emergency Use Authorization (EUA). This EUA will remain in effect (meaning this test can be  used) for the duration of the COVID-19 declaration under Section 564(b)(1) of the Act, 21 U.S.C. section 360bbb-3(b)(1), unless the authorization is terminated or revoked.  Performed at [facility], [address] [place], [place] [address]   CULTURE, BLOOD (ROUTINE X 2) w Reflex to ID Panel     Status: None   Collection Time: 01/14/20  9:37 PM   Specimen: BLOOD  Result Value Ref Range Status   Specimen Description BLOOD BLOOD RIGHT FOREARM  Final   Special Requests   Final    BOTTLES DRAWN AEROBIC AND ANAEROBIC Blood Culture adequate volume   Culture   Final    NO GROWTH 5 DAYS Performed at [facility], [address] [place], [place] [address]    Report Status 01/19/2020 FINAL  Final  CULTURE, BLOOD (ROUTINE X 2) w Reflex to ID Panel     Status: None   Collection Time: 01/14/20  9:37 PM    Specimen: BLOOD  Result Value Ref Range Status   Specimen Description BLOOD RIGHT ASSIST CONTROL  Final   Special Requests   Final    BOTTLES DRAWN AEROBIC AND ANAEROBIC Blood Culture adequate volume   Culture   Final    NO GROWTH 5 DAYS Performed at [facility], [address] [place], [place] [address]    Report Status 01/19/2020 FINAL  Final    Procedures and diagnostic studies:  No results found.             LOS: 6 days   Lizabeth Fellner  [facility]   Pager on [email or web address]. If 7PM-7AM, please contact night-coverage at [email or web address]     01/20/2020, 11:51 AM

## 2020-01-20 NOTE — Care Management Important Message (Signed)
Important Message  Patient Details  Name: Derek Shelton MRN: [id number] Date of Birth: [birth year]   Medicare Important Message Given:  Yes  Reviewed with son, [name].  Copy of Medicare IM sent securely to his attention at [email or web address].    Johnell Comings 01/20/2020, 1:24 PM

## 2020-01-20 NOTE — Progress Notes (Signed)
Blood transfusion completed without any reaction. Will continue to monitor. 

## 2020-01-20 NOTE — Progress Notes (Signed)
Pulmonary Medicine          Date: 01/20/2020,   MRN# [id number] Derek Shelton May 08, 1940     AdmissionWeight: 88 kg                 CurrentWeight: 91.8 kg   Referring physician: Dr Mal [name]   CHIEF COMPLAINT:   Acute on chronic hypoxemic respiratory failure due to [place] is a 79 y.o. male with medical history significant for chronic diastolic CHF, pulmonary arterial hypertension, history of CVA, diabetes, atrial fibrillation on Eliquis, chronic kidney disease stage IIIa, chronic anemia, dementia, hypothyroidism, and CAD, now presenting to the emergency department with 3 days of progressive cough, shortness of breath, fevers, lethargy, and loss of appetite.  Household contacts have tested positive for COVID-19.  Patient has not had any chest pain, abdominal pain, diarrhea, or vomiting.  No recent leg swelling or tenderness.  No hemoptysis.  Patient's son at the bedside assists with the history and notes that the patient has been monitoring her O2 saturations at home and has been dropping into the mid 80s with minimal exertion.  Upon arrival to the ED, patient is found to be febrile to 38.1 C, saturating mid 90s while at rest, mildly tachypneic, tachycardic as high as 150 in atrial fibrillation, and with blood pressure 118/90.  EKG features atrial fibrillation with RVR, rate 128.  Chest x-ray notable for cardiomegaly and mild vascular congestion.  Chemistry panel with creatinine 1.33.  CBC notable for hemoglobin of 9.8, similar to last month.  High-sensitivity troponin is slightly elevated and BNP is 300.  Blood cultures were ordered from the emergency department and the patient was started on remdesivir, Solu-Medrol, Rocephin, and azithromycin. Due to patient complex and comorbid status family requested pulmonary consultation for additional input.   01/18/20- patient improved.  He is on room air.  Son at bedside.  For PT/OT.    01/19/20- patient is doing well.  He was able to walk in room with physical therapy.  I spke to son today and went over his care plan and current condition. Anticipate d/c after end of Vecluri protocol - today is day 4. CBC and BMP stable.   01/20/20- patient improved. He is medically optmized for DC. May dc home in am. Typical prednisone taper and follow up in pulm clinic within 7d  PAST MEDICAL HISTORY   Past Medical History:  Diagnosis Date  . Anemia   . Asthma   . CHF (congestive heart failure) ([place])   . COPD (chronic obstructive pulmonary disease) ([place])   . Coronary artery disease   . Dementia ([place])    Per son's report  . Diabetes mellitus without complication ([place])   . Dysrhythmia    Atrial Fibrillation  . GERD (gastroesophageal reflux disease)   . GI bleed   . Hyperlipidemia   . Hypertension   . Hypothyroidism   . Shortness of breath dyspnea   . Sleep apnea   . Stroke ([place])   . Thyroid disease      SURGICAL HISTORY   Past Surgical History:  Procedure Laterality Date  . CARDIAC CATHETERIZATION    . COLONOSCOPY N/A 08/10/2014   Procedure: COLONOSCOPY;  Surgeon: Manya Silvas, MD;  Location: [facility] ENDOSCOPY;  Service: Endoscopy;  Laterality: N/A;  . COLONOSCOPY WITH PROPOFOL N/A 04/05/2016   Procedure: COLONOSCOPY WITH PROPOFOL;  Surgeon: Lollie Sails, MD;  Location: [facility] ENDOSCOPY;  Service:  Endoscopy;  Laterality: N/A;  . COLONOSCOPY WITH PROPOFOL N/A 07/10/2019   Procedure: COLONOSCOPY WITH PROPOFOL;  Surgeon: Lucilla Lame, MD;  Location: [facility] ENDOSCOPY;  Service: Endoscopy;  Laterality: N/A;  . ESOPHAGOGASTRODUODENOSCOPY N/A 08/08/2014   Procedure: ESOPHAGOGASTRODUODENOSCOPY (EGD);  Surgeon: Manya Silvas, MD;  Location: [facility] ENDOSCOPY;  Service: Endoscopy;  Laterality: N/A;  plan for early afternoon  . ESOPHAGOGASTRODUODENOSCOPY (EGD) WITH PROPOFOL N/A 07/10/2019   Procedure: ESOPHAGOGASTRODUODENOSCOPY (EGD) WITH PROPOFOL;  Surgeon: Lucilla Lame, MD;   Location: [facility] ENDOSCOPY;  Service: Endoscopy;  Laterality: N/A;  . EYE SURGERY    . GIVENS CAPSULE STUDY N/A 08/12/2014   Procedure: GIVENS CAPSULE STUDY;  Surgeon: Manya Silvas, MD;  Location: [facility] ENDOSCOPY;  Service: Endoscopy;  Laterality: N/A;  . rectal fistula N/A      FAMILY HISTORY   Family History  Problem Relation Age of Onset  . Diabetes Mother      SOCIAL HISTORY   Social History   Tobacco Use  . Smoking status: Former Smoker    Quit date: 2000    Years since quitting: 22.0  . Smokeless tobacco: Former Systems developer    Types: Secondary school teacher  . Vaping Use: Never used  Substance Use Topics  . Alcohol use: No  . Drug use: No     MEDICATIONS    Home Medication:    Current Medication:  Current Facility-Administered Medications:  .  0.9 %  sodium chloride infusion (Manually program via Guardrails IV Fluids), , Intravenous, Once, Jennye Boroughs, MD .  0.9 %  sodium chloride infusion, 250 mL, Intravenous, PRN, Opyd, Ilene Qua, MD, Last Rate: 10 mL/hr at 01/15/20 1020, 250 mL at 01/15/20 1020 .  0.9 %  sodium chloride infusion, , Intravenous, PRN, Jennye Boroughs, MD, Stopped at 01/18/20 1317 .  acetaminophen (TYLENOL) tablet 650 mg, 650 mg, Oral, Q6H PRN, Opyd, Ilene Qua, MD, 650 mg at 01/16/20 2120 .  apixaban (ELIQUIS) tablet 2.5 mg, 2.5 mg, Oral, BID, Oswald Hillock, RPH .  atorvastatin (LIPITOR) tablet 40 mg, 40 mg, Oral, q1800, Jennye Boroughs, MD, 40 mg at 01/19/20 2223 .  chlorpheniramine-HYDROcodone (TUSSIONEX) 10-8 MG/5ML suspension 5 mL, 5 mL, Oral, Q12H PRN, Opyd, Ilene Qua, MD, 5 mL at 01/18/20 1313 .  diltiazem (CARDIZEM CD) 24 hr capsule 120 mg, 120 mg, Oral, Daily, Jennye Boroughs, MD .  diltiazem (CARDIZEM) 125 mg in dextrose 5% 125 mL (1 mg/mL) infusion, 5-15 mg/hr, Intravenous, Titrated, Jennye Boroughs, MD, Last Rate: 5 mL/hr at 01/20/20 0833, 5 mg/hr at 01/20/20 0833 .  donepezil (ARICEPT) tablet 5 mg, 5 mg, Oral, QHS, Jennye Boroughs, MD, 5 mg at  01/19/20 2222 .  DULoxetine (CYMBALTA) DR capsule 60 mg, 60 mg, Oral, Daily, Jennye Boroughs, MD, 60 mg at 01/20/20 0818 .  EPINEPHrine (EPI-PEN) injection 0.3 mg, 0.3 mg, Intramuscular, Once PRN, Jennye Boroughs, MD .  ferrous sulfate tablet 325 mg, 325 mg, Oral, Daily, Jennye Boroughs, MD, 325 mg at 01/20/20 0818 .  finasteride (PROSCAR) tablet 5 mg, 5 mg, Oral, Daily, Jennye Boroughs, MD, 5 mg at 01/20/20 0818 .  furosemide (LASIX) injection 20 mg, 20 mg, Intravenous, Once, Jennye Boroughs, MD .  guaiFENesin-dextromethorphan (ROBITUSSIN DM) 100-10 MG/5ML syrup 10 mL, 10 mL, Oral, Q4H PRN, Opyd, Ilene Qua, MD, 10 mL at 01/19/20 1000 .  insulin aspart (novoLOG) injection 0-5 Units, 0-5 Units, Subcutaneous, QHS, Opyd, Ilene Qua, MD, 2 Units at 01/19/20 2221 .  insulin aspart (novoLOG) injection 0-9 Units, 0-9 Units, Subcutaneous,  TID WC, Opyd, Ilene Qua, MD, 1 Units at 01/20/20 [phone] .  insulin glargine (LANTUS) injection 15 Units, 15 Units, Subcutaneous, QHS, Jennye Boroughs, MD, 15 Units at 01/19/20 2222 .  Ipratropium-Albuterol (COMBIVENT) respimat 1 puff, 1 puff, Inhalation, Q6H, Opyd, Ilene Qua, MD, 1 puff at 01/20/20 0821 .  levothyroxine (SYNTHROID) tablet 112 mcg, 112 mcg, Oral, Q0600, Jennye Boroughs, MD, 112 mcg at 01/20/20 [id number] .  linagliptin (TRADJENTA) tablet 5 mg, 5 mg, Oral, Daily, Opyd, Ilene Qua, MD, 5 mg at 01/20/20 0818 .  metoprolol tartrate (LOPRESSOR) injection 2.5 mg, 2.5 mg, Intravenous, Q2H PRN, Opyd, Ilene Qua, MD, 2.5 mg at 01/15/20 1705 .  metoprolol tartrate (LOPRESSOR) tablet 50 mg, 50 mg, Oral, BID, Jennye Boroughs, MD, 50 mg at 01/20/20 0818 .  mometasone-formoterol (DULERA) 200-5 MCG/ACT inhaler 2 puff, 2 puff, Inhalation, BID, Opyd, Ilene Qua, MD, 2 puff at 01/20/20 0826 .  montelukast (SINGULAIR) tablet 10 mg, 10 mg, Oral, QHS, Opyd, Ilene Qua, MD, 10 mg at 01/19/20 2223 .  ondansetron (ZOFRAN) tablet 4 mg, 4 mg, Oral, Q6H PRN **OR** ondansetron (ZOFRAN) injection 4 mg, 4  mg, Intravenous, Q6H PRN, Opyd, Timothy S, MD .  pantoprazole (PROTONIX) EC tablet 40 mg, 40 mg, Oral, Daily, Opyd, Ilene Qua, MD, 40 mg at 01/20/20 0819 .  polyethylene glycol (MIRALAX / GLYCOLAX) packet 17 g, 17 g, Oral, Daily, [place], Bing Neighbors, NP, 17 g at 01/20/20 0826 .  predniSONE (DELTASONE) tablet 30 mg, 30 mg, Oral, Daily, Pervis Macintyre, MD, 30 mg at 01/20/20 0818 .  sildenafil (REVATIO) tablet 20 mg, 20 mg, Oral, TID, Jennye Boroughs, MD, 20 mg at 01/20/20 0826 .  tamsulosin (FLOMAX) capsule 0.4 mg, 0.4 mg, Oral, Daily, Jennye Boroughs, MD, 0.4 mg at 01/20/20 0818 .  tiotropium (SPIRIVA) inhalation capsule ([facility] use ONLY) 18 mcg, 18 mcg, Inhalation, Daily, Opyd, Ilene Qua, MD, 18 mcg at 01/20/20 0827    ALLERGIES   Carvedilol, Penicillins, Aspirin, Aricept [donepezil hcl], and Catapres [clonidine hcl]     REVIEW OF SYSTEMS    Review of Systems:  Gen:  Denies  fever, sweats, chills weigh loss  HEENT: Denies blurred vision, double vision, ear pain, eye pain, hearing loss, nose bleeds, sore throat Cardiac:  No dizziness, chest pain or heaviness, chest tightness,edema Resp:   Denies cough or sputum porduction, shortness of breath,wheezing, hemoptysis,  Gi: Denies swallowing difficulty, stomach pain, nausea or vomiting, diarrhea, constipation, bowel incontinence Gu:  Denies bladder incontinence, burning urine Ext:   Denies Joint pain, stiffness or swelling Skin: Denies  skin rash, easy bruising or bleeding or hives Endoc:  Denies polyuria, polydipsia , polyphagia or weight change Psych:   Denies depression, insomnia or hallucinations   Other:  All other systems negative   VS: BP 118/89 (BP Location: Right Arm)   Pulse 98   Temp 97.8 F (36.6 C) (Oral)   Resp 19   Ht 5\' 5"  (1.651 m)   Wt 91.8 kg   SpO2 98%   BMI 33.68 kg/m      PHYSICAL EXAM    GENERAL:NAD, no fevers, chills, no weakness no fatigue HEAD: Normocephalic, atraumatic.  EYES: Pupils equal,  round, reactive to light. Extraocular muscles intact. No scleral icterus.  MOUTH: Moist mucosal membrane. Dentition intact. No abscess noted.  EAR, NOSE, THROAT: Clear without exudates. No external lesions.  NECK: Supple. No thyromegaly. No nodules. No JVD.  PULMONARY: Mild rhonchi CARDIOVASCULAR: S1 and S2. Regular rate and rhythm. No murmurs, rubs, or gallops.  No edema. Pedal pulses 2+ bilaterally.  GASTROINTESTINAL: Soft, nontender, nondistended. No masses. Positive bowel sounds. No hepatosplenomegaly.  MUSCULOSKELETAL: No swelling, clubbing, or edema. Range of motion full in all extremities.  NEUROLOGIC: Cranial nerves II through XII are intact. No gross focal neurological deficits. Sensation intact. Reflexes intact.  SKIN: No ulceration, lesions, rashes, or cyanosis. Skin warm and dry. Turgor intact.  PSYCHIATRIC: Mood, affect within normal limits. The patient is awake, alert and oriented x 3. Insight, judgment intact.       IMAGING    DG Chest 2 View  Result Date: 01/14/2020 CLINICAL DATA:  Cough, difficulty breathing EXAM: CHEST - 2 VIEW COMPARISON:  08/06/2019 FINDINGS: Frontal and lateral enlargement the cardiac silhouette views of the chest demonstrate marked. There is mild central vascular congestion without airspace disease, effusion, or pneumothorax. Chronic elevation of the right hemidiaphragm. IMPRESSION: Enlarged cardiac silhouette with mild central vascular congestion. No acute airspace disease. Electronically Signed   By: Sharlet Salina M.D.   On: 01/14/2020 18:19   DG Chest Port 1 View  Result Date: 01/16/2020 CLINICAL DATA:  Pneumonia EXAM: PORTABLE CHEST 1 VIEW COMPARISON:  Two days ago FINDINGS: Mild patchy haziness over both lungs. Cardiopericardial enlargement and vascular pedicle widening. No visible effusion or pneumothorax. IMPRESSION: 1. Hazy pulmonary opacity correlating with history of pneumonia. 2. Cardiomegaly. Electronically Signed   By: Marnee Spring M.D.    On: 01/16/2020 05:58      ASSESSMENT/PLAN   Acute on chronic hypoxemic respiratory failure Acute COVID19 infection -Remdesevir antiviral - pharmacy protocol 5 d -vitamin C -zinc -pred 40 daily >>>01/19/20-decreased to 30mg  -Self prone not necessary for this patient -encourage to use IS-patient able to take 600cc tidal volume>>>768ml 01/19/20 -s/p MAB infusion  -d/c hepatotoxic medications while on remdesevir -supportive care with ICU telemetry monitoring -PT/OT -12/26- patient did well during ambulation challenge -procalcitonin, CRP and ferritin trending -discussed care plan with both sons today they are agreeable to care plan and thankful for care 12/27- patient optimized for d/c home in am    Additional chronic comorbid conditions  COPD- dulera and comvent plus steroids  Pulmonary hypertension - Sildenafil TID OSA- patient does not wish to use CPAP device  Obesity -improved CKD- followed outpatient by [facility]- hx of blood transfusion  Deconditioning - pt/ot      Thank you for allowing me to participate in the care of this patient.   Patient/Family are satisfied with care plan and all questions have been answered.  This document was prepared using Dragon voice recognition software and may include unintentional dictation errors.     1/28, M.D.  Division of Pulmonary & Critical Care Medicine  [facility]

## 2020-01-21 DIAGNOSIS — I4891 Unspecified atrial fibrillation: Secondary | ICD-10-CM | POA: Diagnosis not present

## 2020-01-21 DIAGNOSIS — I10 Essential (primary) hypertension: Secondary | ICD-10-CM

## 2020-01-21 DIAGNOSIS — U071 COVID-19: Secondary | ICD-10-CM | POA: Diagnosis not present

## 2020-01-21 DIAGNOSIS — I5032 Chronic diastolic (congestive) heart failure: Secondary | ICD-10-CM | POA: Diagnosis not present

## 2020-01-21 DIAGNOSIS — N179 Acute kidney failure, unspecified: Secondary | ICD-10-CM | POA: Diagnosis not present

## 2020-01-21 LAB — TYPE AND SCREEN
ABO/RH(D): A POS
Antibody Screen: NEGATIVE
Unit division: 0

## 2020-01-21 LAB — CBC WITH DIFFERENTIAL/PLATELET
Abs Immature Granulocytes: 0.11 10*3/uL — ABNORMAL HIGH (ref 0.00–0.07)
Basophils Absolute: 0 10*3/uL (ref 0.0–0.1)
Basophils Relative: 0 %
Eosinophils Absolute: 0 10*3/uL (ref 0.0–0.5)
Eosinophils Relative: 0 %
HCT: 26.9 % — ABNORMAL LOW (ref 39.0–52.0)
Hemoglobin: 8.6 g/dL — ABNORMAL LOW (ref 13.0–17.0)
Immature Granulocytes: 1 %
Lymphocytes Relative: 13 %
Lymphs Abs: 1 10*3/uL (ref 0.7–4.0)
MCH: 28.4 pg (ref 26.0–34.0)
MCHC: 32 g/dL (ref 30.0–36.0)
MCV: 88.8 fL (ref 80.0–100.0)
Monocytes Absolute: 0.7 10*3/uL (ref 0.1–1.0)
Monocytes Relative: 9 %
Neutro Abs: 5.9 10*3/uL (ref 1.7–7.7)
Neutrophils Relative %: 77 %
Platelets: 173 10*3/uL (ref 150–400)
RBC: 3.03 MIL/uL — ABNORMAL LOW (ref 4.22–5.81)
RDW: 15.3 % (ref 11.5–15.5)
WBC: 7.7 10*3/uL (ref 4.0–10.5)
nRBC: 0.4 % — ABNORMAL HIGH (ref 0.0–0.2)

## 2020-01-21 LAB — BASIC METABOLIC PANEL
Anion gap: 9 (ref 5–15)
BUN: 66 mg/dL — ABNORMAL HIGH (ref 8–23)
CO2: 26 mmol/L (ref 22–32)
Calcium: 8.7 mg/dL — ABNORMAL LOW (ref 8.9–10.3)
Chloride: 103 mmol/L (ref 98–111)
Creatinine, Ser: 1.38 mg/dL — ABNORMAL HIGH (ref 0.61–1.24)
GFR, Estimated: 51 mL/min — ABNORMAL LOW (ref >60)
Glucose, Bld: 130 mg/dL — ABNORMAL HIGH (ref 70–99)
Potassium: 4.6 mmol/L (ref 3.5–5.1)
Sodium: 138 mmol/L (ref 135–145)

## 2020-01-21 LAB — MAGNESIUM: Magnesium: 2.9 mg/dL — ABNORMAL HIGH (ref 1.7–2.4)

## 2020-01-21 LAB — GLUCOSE, CAPILLARY
Glucose-Capillary: 121 mg/dL — ABNORMAL HIGH (ref 70–99)
Glucose-Capillary: 160 mg/dL — ABNORMAL HIGH (ref 70–99)

## 2020-01-21 LAB — HEMOGLOBIN AND HEMATOCRIT, BLOOD
HCT: 27.8 % — ABNORMAL LOW (ref 39.0–52.0)
Hemoglobin: 9.1 g/dL — ABNORMAL LOW (ref 13.0–17.0)

## 2020-01-21 LAB — BPAM RBC
Blood Product Expiration Date: 202112282359
ISSUE DATE / TIME: 202112271639
Unit Type and Rh: 9500

## 2020-01-21 MED ORDER — DILTIAZEM HCL ER COATED BEADS 180 MG PO CP24: 180.0000 mg | ORAL_CAPSULE | Freq: Every day | ORAL | 11 refills | Status: AC

## 2020-01-21 MED ORDER — TORSEMIDE 20 MG PO TABS: 20.0000 mg | ORAL_TABLET | Freq: Every day | ORAL | Status: AC | PRN

## 2020-01-21 MED ORDER — METOPROLOL TARTRATE 50 MG PO TABS: 50.0000 mg | ORAL_TABLET | Freq: Two times a day (BID) | ORAL | 0 refills | Status: AC

## 2020-01-21 NOTE — Discharge Summary (Signed)
Physician Discharge Summary  Derek Shelton [id number] DOB: [birth year] DOA: 01/14/2020  PCP: Gladstone Lighter, MD  Admit date: 01/14/2020 Discharge date: 01/21/2020  Discharge disposition: Home with home health therapy   Recommendations for Outpatient Follow-Up:   Follow-up with PCP in 1 week   Discharge Diagnosis:   Principal Problem:   Acute respiratory disease due to COVID-19 virus Active Problems:   Chronic diastolic heart failure (HCC)   Atrial fibrillation with RVR ([place])   AKI (acute kidney injury) ([place])   Coronary artery disease   Essential hypertension   Normocytic anemia   Dementia without behavioral disturbance ([place])   History of CVA (cerebrovascular accident)   Chronic kidney disease, stage 3a ([place])   Type II diabetes mellitus with renal manifestations ([place])   COPD (chronic obstructive pulmonary disease) ([place])   Hypothyroidism    Discharge Condition: Stable.  Diet recommendation:  Diet Order            Diet - low sodium heart healthy           Diet heart healthy/carb modified Room service appropriate? Yes; Fluid consistency: Thin  Diet effective now                   Code Status: Full Code     Hospital Course:   Mr. Derek Shelton is a 79 y.o. male with medical history significant forchronic diastolic CHF, pulmonary arterial hypertension, history of CVA, diabetes, atrial fibrillation on Eliquis, chronic kidney disease stage IIIa, chronic anemia, dementia, hypothyroidism, and CAD, now presenting to the emergency department with 3 days of progressive cough, shortness of breath, fevers, lethargy, and loss of appetite. Household contacts have tested positive for COVID-19.   Reportedly, he was hypoxic at home but he was not hypoxic in the hospital.  His son said that he has intermittent hypoxia from not using CPAP at home and he attributed hypoxia at home to not using the CPAP.  Initial chest x-ray did not show any evidence of pneumonia.   His son requested that patient be treated with monoclonal antibody infusion so he was given bamlanivimab and etesevimab infusion.  He had been started on IV dexamethasone and IV remdesivir when he was initially admitted but these were discontinued.  However, repeat chest x-ray showed changes suggestive of COVID-19 pneumonia.  His son requested treatment with IV remdesivir and steroids which was done.  He was also seen in consultation by the pulmonologist per son's request.  He has CKD stage IIIa.  He developed acute kidney injury so torsemide was discontinued and he was given IV fluids.  Creatinine has improved close to baseline.  Resume torsemide at discharge but only to be used as needed for swelling to decrease the risk of AKI from overdiuresis. He also had mild COPD exacerbation that was treated with bronchodilators and prednisone.   He paroxysmal atrial fibrillation and he developed atrial fibrillation with RVR requiring IV Cardizem infusion for rate control.  He was on metoprolol 25 mg twice daily prior to admission but this was increased to 50 mg twice daily for adequate rate control.  Oral Cardizem was also prescribed for rate control of atrial fibrillation.   He has chronic iron deficiency anemia and anemia of chronic disease.  Hemoglobin dropped from 9.8 on admission to 7.6.  He was transfused with 1 unit of packed red blood cells.  After blood transfusion, nurse reported that patient had passed dark stools.  It is worth noting that previously, patient was  constipated so he was given MiraLAX to help with constipation.  H&H was repeated.  However, repeat hemoglobin was 9.1 (up from 8.6) on the day of discharge.  It is suspected that dark stools may be from ferrous sulfate pills.  He is on Protonix from home which has been continued.  Outpatient follow-up with gastroenterologist was recommended for further evaluation.   He was seen by PT and OT who recommended home health therapy.  His condition  has improved and he is deemed stable for discharge to home today.  Discharge plan was discussed in detail with his son, Dr. Linus Mako, and he agreed with the plan.  Medical Consultants:    Pulmonologist   Discharge Exam:    Vitals:   01/21/20 0500 01/21/20 0600 01/21/20 0927 01/21/20 1236  BP:   (!) 114/94 (!) 132/99  Pulse:   (!) 112 99  Resp: 13 11 20 20   Temp:   97.9 F (36.6 C) 97.7 F (36.5 C)  TempSrc:   Oral Oral  SpO2:   100% 98%  Weight:      Height:         GEN: NAD SKIN: Warm and dry EYES: No pallor or icterus ENT: MMM CV: Irregular rate and rhythm PULM: CTA B ABD: soft, obese, NT, +BS CNS: AAO x 3, non focal EXT: No edema or tenderness   The results of significant diagnostics from this hospitalization (including imaging, microbiology, ancillary and laboratory) are listed below for reference.     Procedures and Diagnostic Studies:   DG Chest 2 View  Result Date: 01/14/2020 CLINICAL DATA:  Cough, difficulty breathing EXAM: CHEST - 2 VIEW COMPARISON:  08/06/2019 FINDINGS: Frontal and lateral enlargement the cardiac silhouette views of the chest demonstrate marked. There is mild central vascular congestion without airspace disease, effusion, or pneumothorax. Chronic elevation of the right hemidiaphragm. IMPRESSION: Enlarged cardiac silhouette with mild central vascular congestion. No acute airspace disease. Electronically Signed   By: Randa Ngo M.D.   On: 01/14/2020 18:19     Labs:   Basic Metabolic Panel: Recent Labs  Lab 01/15/20 0437 01/16/20 0403 01/17/20 0419 01/18/20 0415 01/19/20 0317 01/20/20 0340 01/21/20 0413  NA 135   < > 132* 132* 134* 135 138  K 4.1   < > 4.0 4.5 4.8 5.0 4.6  CL 99   < > 97* 97* 101 101 103  CO2 25   < > 26 26 26 27 26   GLUCOSE 116*   < > 151* 243* 210* 159* 130*  BUN 43*   < > 77* 67* 67* 69* 66*  CREATININE 1.40*   < > 1.75* 1.33* 1.46* 1.52* 1.38*  CALCIUM 9.0   < > 8.2* 8.4* 8.6* 8.8* 8.7*  MG 2.3  --    --   --  2.9*  --  2.9*  PHOS 5.2*  --   --   --   --   --   --    < > = values in this interval not displayed.   GFR Estimated Creatinine Clearance: 43.8 mL/min (A) (by C-G formula based on SCr of 1.38 mg/dL (H)). Liver Function Tests: Recent Labs  Lab 01/15/20 0437 01/16/20 0403 01/17/20 0419 01/18/20 0415 01/19/20 0317  AST 34 39 32 32 29  ALT 17 20 19 20 20   ALKPHOS 88 87 69 66 62  BILITOT 0.7 0.8 0.7 0.5 0.8  PROT 7.8 7.6 6.8 6.8 7.0  ALBUMIN 3.5 3.5 3.1* 3.3* 3.3*   No  results for input(s): LIPASE, AMYLASE in the last 168 hours. No results for input(s): AMMONIA in the last 168 hours. Coagulation profile No results for input(s): INR, PROTIME in the last 168 hours.  CBC: Recent Labs  Lab 01/17/20 0419 01/18/20 0415 01/19/20 0317 01/20/20 0340 01/21/20 0413 01/21/20 0938  WBC 7.0 6.4 7.1 6.9 7.7  --   NEUTROABS 5.0 5.2 5.4 5.4 5.9  --   HGB 8.6* 8.5* 8.0* 7.6* 8.6* 9.1*  HCT 27.3* 26.4* 25.5* 24.6* 26.9* 27.8*  MCV 88.1 86.8 89.2 89.1 88.8  --   PLT 162 153 164 167 173  --    Cardiac Enzymes: No results for input(s): CKTOTAL, CKMB, CKMBINDEX, TROPONINI in the last 168 hours. BNP: Invalid input(s): POCBNP CBG: Recent Labs  Lab 01/20/20 1203 01/20/20 1700 01/20/20 1950 01/21/20 0922 01/21/20 1233  GLUCAP 150* 214* 197* 121* 160*   D-Dimer No results for input(s): DDIMER in the last 72 hours. Hgb A1c No results for input(s): HGBA1C in the last 72 hours. Lipid Profile No results for input(s): CHOL, HDL, LDLCALC, TRIG, CHOLHDL, LDLDIRECT in the last 72 hours. Thyroid function studies No results for input(s): TSH, T4TOTAL, T3FREE, THYROIDAB in the last 72 hours.  Invalid input(s): FREET3 Anemia work up Recent Labs    01/19/20 0317  FERRITIN 25   Microbiology Recent Results (from the past 240 hour(s))  Resp Panel by RT-PCR (Flu A&B, Covid) Nasopharyngeal Swab     Status: Abnormal   Collection Time: 01/14/20  6:41 PM   Specimen: Nasopharyngeal  Swab; Nasopharyngeal(NP) swabs in vial transport medium  Result Value Ref Range Status   SARS Coronavirus 2 by RT PCR POSITIVE (A) NEGATIVE Final    Comment: RESULT CALLED TO, READ BACK BY AND VERIFIED WITH: CANDICE KIO @1934  ON 01/14/20 SKL (NOTE) SARS-CoV-2 target nucleic acids are DETECTED.  The SARS-CoV-2 RNA is generally detectable in upper respiratory specimens during the acute phase of infection. Positive results are indicative of the presence of the identified virus, but do not rule out bacterial infection or co-infection with other pathogens not detected by the test. Clinical correlation with patient history and other diagnostic information is necessary to determine patient infection status. The expected result is Negative.  Fact Sheet for Patients: EntrepreneurPulse.com.au  Fact Sheet for Healthcare Providers: IncredibleEmployment.be  This test is not yet approved or cleared by the [place] FDA and  has been authorized for detection and/or diagnosis of SARS-CoV-2 by FDA under an Emergency Use Authorization (EUA).  This EUA will remain in effect (meaning this test can be  used) for the duration of  the COVID-19 declaration under Section 564(b)(1) of the Act, 21 U.S.C. section 360bbb-3(b)(1), unless the authorization is terminated or revoked sooner.     Influenza A by PCR NEGATIVE NEGATIVE Final   Influenza B by PCR NEGATIVE NEGATIVE Final    Comment: (NOTE) The Xpert Xpress SARS-CoV-2/FLU/RSV plus assay is intended as an aid in the diagnosis of influenza from Nasopharyngeal swab specimens and should not be used as a sole basis for treatment. Nasal washings and aspirates are unacceptable for Xpert Xpress SARS-CoV-2/FLU/RSV testing.  Fact Sheet for Patients: EntrepreneurPulse.com.au  Fact Sheet for Healthcare Providers: IncredibleEmployment.be  This test is not yet approved or cleared by  the [place] FDA and has been authorized for detection and/or diagnosis of SARS-CoV-2 by FDA under an Emergency Use Authorization (EUA). This EUA will remain in effect (meaning this test can be used) for the duration of the COVID-19 declaration under  Section 564(b)(1) of the Act, 21 U.S.C. section 360bbb-3(b)(1), unless the authorization is terminated or revoked.  Performed at [facility], [address] [place], [place] [address]   CULTURE, BLOOD (ROUTINE X 2) w Reflex to ID Panel     Status: None   Collection Time: 01/14/20  9:37 PM   Specimen: BLOOD  Result Value Ref Range Status   Specimen Description BLOOD BLOOD RIGHT FOREARM  Final   Special Requests   Final    BOTTLES DRAWN AEROBIC AND ANAEROBIC Blood Culture adequate volume   Culture   Final    NO GROWTH 5 DAYS Performed at [facility], [address] [place], [place] [address]    Report Status 01/19/2020 FINAL  Final  CULTURE, BLOOD (ROUTINE X 2) w Reflex to ID Panel     Status: None   Collection Time: 01/14/20  9:37 PM   Specimen: BLOOD  Result Value Ref Range Status   Specimen Description BLOOD RIGHT ASSIST CONTROL  Final   Special Requests   Final    BOTTLES DRAWN AEROBIC AND ANAEROBIC Blood Culture adequate volume   Culture   Final    NO GROWTH 5 DAYS Performed at [facility], [address], [place], [place] [address]    Report Status 01/19/2020 FINAL  Final     Discharge Instructions:   Discharge Instructions    Diet - low sodium heart healthy   Complete by: As directed    Discharge instructions   Complete by: As directed    Isolate at home for 4 days.  ?   Person Under Monitoring Name: DIONTAE ROUTE  Location: [address] [place] [address]   Infection Prevention Recommendations for Individuals Confirmed to have, or Being Evaluated for, 2019 Novel Coronavirus (COVID-19) Infection Who Receive Care at Home  Individuals who are confirmed to  have, or are being evaluated for, COVID-19 should follow the prevention steps below until a healthcare provider or local or state health department says they can return to normal activities.  Stay home except to get medical care You should restrict activities outside your home, except for getting medical care. Do not go to work, school, or public areas, and do not use public transportation or taxis.  Call ahead before visiting your doctor Before your medical appointment, call the healthcare provider and tell them that you have, or are being evaluated for, COVID-19 infection. This will help the healthcare provider's office take steps to keep other people from getting infected. Ask your healthcare provider to call the local or state health department.  Monitor your symptoms Seek prompt medical attention if your illness is worsening (e.g., difficulty breathing). Before going to your medical appointment, call the healthcare provider and tell them that you have, or are being evaluated for, COVID-19 infection. Ask your healthcare provider to call the local or state health department.  Wear a facemask You should wear a facemask that covers your nose and mouth when you are in the same room with other people and when you visit a healthcare provider. People who live with or visit you should also wear a facemask while they are in the same room with you.  Separate yourself from other people in your home As much as possible, you should stay in a different room from other people in your home. Also, you should use a separate bathroom, if available.  Avoid sharing household items You should not share dishes, drinking glasses, cups, eating utensils, towels, bedding,  or other items with other people in your home. After using these items, you should wash them thoroughly with soap and water.  Cover your coughs and sneezes Cover your mouth and nose with a tissue when you cough or sneeze, or you can cough  or sneeze into your sleeve. Throw used tissues in a lined trash can, and immediately wash your hands with soap and water for at least 20 seconds or use an alcohol-based hand rub.  Wash your [facility] your hands often and thoroughly with soap and water for at least 20 seconds. You can use an alcohol-based hand sanitizer if soap and water are not available and if your hands are not visibly dirty. Avoid touching your eyes, nose, and mouth with unwashed hands.   Prevention Steps for Caregivers and Household Members of Individuals Confirmed to have, or Being Evaluated for, COVID-19 Infection Being Cared for in the Home  If you live with, or provide care at home for, a person confirmed to have, or being evaluated for, COVID-19 infection please follow these guidelines to prevent infection:  Follow healthcare provider's instructions Make sure that you understand and can help the patient follow any healthcare provider instructions for all care.  Provide for the patient's basic needs You should help the patient with basic needs in the home and provide support for getting groceries, prescriptions, and other personal needs.  Monitor the patient's symptoms If they are getting sicker, call his or her medical provider and tell them that the patient has, or is being evaluated for, COVID-19 infection. This will help the healthcare provider's office take steps to keep other people from getting infected. Ask the healthcare provider to call the local or state health department.  Limit the number of people who have contact with the patient If possible, have only one caregiver for the patient. Other household members should stay in another home or place of residence. If this is not possible, they should stay in another room, or be separated from the patient as much as possible. Use a separate bathroom, if available. Restrict visitors who do not have an essential need to be in the home.  Keep older  adults, very young children, and other sick people away from the patient Keep older adults, very young children, and those who have compromised immune systems or chronic health conditions away from the patient. This includes people with chronic heart, lung, or kidney conditions, diabetes, and cancer.  Ensure good ventilation Make sure that shared spaces in the home have good air flow, such as from an air conditioner or an opened window, weather permitting.  Wash your hands often Wash your hands often and thoroughly with soap and water for at least 20 seconds. You can use an alcohol based hand sanitizer if soap and water are not available and if your hands are not visibly dirty. Avoid touching your eyes, nose, and mouth with unwashed hands. Use disposable paper towels to dry your hands. If not available, use dedicated cloth towels and replace them when they become wet.  Wear a facemask and gloves Wear a disposable facemask at all times in the room and gloves when you touch or have contact with the patient's blood, body fluids, and/or secretions or excretions, such as sweat, saliva, sputum, nasal mucus, vomit, urine, or feces.  Ensure the mask fits over your nose and mouth tightly, and do not touch it during use. Throw out disposable facemasks and gloves after using them. Do not reuse. Wash your hands  immediately after removing your facemask and gloves. If your personal clothing becomes contaminated, carefully remove clothing and launder. Wash your hands after handling contaminated clothing. Place all used disposable facemasks, gloves, and other waste in a lined container before disposing them with other household waste. Remove gloves and wash your hands immediately after handling these items.  Do not share dishes, glasses, or other household items with the patient Avoid sharing household items. You should not share dishes, drinking glasses, cups, eating utensils, towels, bedding, or other items  with a patient who is confirmed to have, or being evaluated for, COVID-19 infection. After the person uses these items, you should wash them thoroughly with soap and water.  Wash laundry thoroughly Immediately remove and wash clothes or bedding that have blood, body fluids, and/or secretions or excretions, such as sweat, saliva, sputum, nasal mucus, vomit, urine, or feces, on them. Wear gloves when handling laundry from the patient. Read and follow directions on labels of laundry or clothing items and detergent. In general, wash and dry with the warmest temperatures recommended on the label.  Clean all areas the individual has used often Clean all touchable surfaces, such as counters, tabletops, doorknobs, bathroom fixtures, toilets, phones, keyboards, tablets, and bedside tables, every day. Also, clean any surfaces that may have blood, body fluids, and/or secretions or excretions on them. Wear gloves when cleaning surfaces the patient has come in contact with. Use a diluted bleach solution (e.g., dilute bleach with 1 part bleach and 10 parts water) or a household disinfectant with a label that says EPA-registered for coronaviruses. To make a bleach solution at home, add 1 tablespoon of bleach to 1 quart (4 cups) of water. For a larger supply, add  cup of bleach to 1 gallon (16 cups) of water. Read labels of cleaning products and follow recommendations provided on product labels. Labels contain instructions for safe and effective use of the cleaning product including precautions you should take when applying the product, such as wearing gloves or eye protection and making sure you have good ventilation during use of the product. Remove gloves and wash hands immediately after cleaning.  Monitor yourself for signs and symptoms of illness Caregivers and household members are considered close contacts, should monitor their health, and will be asked to limit movement outside of the home to the extent  possible. Follow the monitoring steps for close contacts listed on the symptom monitoring form.   ? If you have additional questions, contact your local health department or call the epidemiologist on call at [phone] (available 24/7). ? This guidance is subject to change. For the most up-to-date guidance from [facility], please refer to their website: YouBlogs.pl   Face-to-face encounter (required for Medicare/Medicaid patients)   Complete by: As directed    I Walters certify that this patient is under my care and that I, or a nurse practitioner or physician's assistant working with me, had a face-to-face encounter that meets the physician face-to-face encounter requirements with this patient on 01/21/2020. The encounter with the patient was in whole, or in part for the following medical condition(s) which is the primary reason for home health care (List medical condition): COVID-19 Pneumonia, CHF, COPD, debility   The encounter with the patient was in whole, or in part, for the following medical condition, which is the primary reason for home health care: COVID-19 Pneumonia, CHF, COPD, debility   I certify that, based on my findings, the following services are medically necessary home health services: Physical therapy  Reason for Medically Necessary Home Health Services: Therapy- Personnel officer, Public librarian   My clinical findings support the need for the above services: Unable to leave home safely without assistance and/or assistive device   Further, I certify that my clinical findings support that this patient is homebound due to: Unable to leave home safely without assistance   Home Health   Complete by: As directed    To provide the following care/treatments:  PT OT     Increase activity slowly   Complete by: As directed      Allergies as of 01/21/2020      Reactions   Carvedilol Shortness Of  Breath   Other reaction(s): Asthma, Shortness of Breath   Penicillins Shortness Of Breath   Other reaction(s): Other (See Comments) Other reaction(s): RASH Other reaction(s): RASH Other reaction(s): RASH   Aspirin    Other reaction(s): Other (See Comments) On eliquis already- high risk of bleeding   Aricept [donepezil Hcl] Other (See Comments)   Medication cause significant bradycardia   Catapres [clonidine Hcl]       Medication List    STOP taking these medications   furosemide 20 MG tablet Commonly known as: LASIX   montelukast 10 MG tablet Commonly known as: SINGULAIR   Potassium Chloride ER 20 MEQ Tbcr   potassium chloride SA 20 MEQ tablet Commonly known as: KLOR-CON     TAKE these medications   Accu-Chek Guide test strip Generic drug: glucose blood E11.9   TO CHECK BLOOD GLUCOSE TWICE A DAY   atorvastatin 40 MG tablet Commonly known as: LIPITOR Take 1 tablet (40 mg total) by mouth daily at 6 PM.   BD Pen Needle Nano 2nd Gen 32G X 4 MM Misc Generic drug: Insulin Pen Needle USE NIGHTLY WITH TRESIBA   budesonide 0.5 MG/2ML nebulizer solution Commonly known as: PULMICORT Inhale 0.5 mg into the lungs 2 (two) times daily. For 10 days   budesonide-formoterol 160-4.5 MCG/ACT inhaler Commonly known as: SYMBICORT Inhale 2 puffs into the lungs 2 (two) times daily.   diltiazem 180 MG 24 hr capsule Commonly known as: Cardizem CD Take 1 capsule (180 mg total) by mouth daily.   donepezil 5 MG tablet Commonly known as: ARICEPT Take 5 mg by mouth at bedtime.   DULoxetine 60 MG capsule Commonly known as: CYMBALTA Take 60 mg by mouth daily.   Eliquis 5 MG Tabs tablet Generic drug: apixaban Take 5 mg by mouth 2 (two) times daily.   ergocalciferol 1.25 MG (50000 UT) capsule Commonly known as: VITAMIN D2 Take 50,000 Units by mouth once a week.   ferrous sulfate 325 (65 FE) MG tablet Take 325 mg by mouth daily.   formoterol 20 MCG/2ML nebulizer  solution Commonly known as: PERFOROMIST Inhale 2 mLs into the lungs 2 (two) times daily.   insulin degludec 100 UNIT/ML FlexTouch Pen Commonly known as: TRESIBA Inject 30 Units into the skin at bedtime.   ipratropium-albuterol 0.5-2.5 (3) MG/3ML Soln Commonly known as: DUONEB Inhale 3 mLs into the lungs every 6 (six) hours as needed for shortness of breath.   latanoprost 0.005 % ophthalmic solution Commonly known as: XALATAN latanoprost 0.005 % eye drops  INSTILL ONE DROP TO BOTH EYES AT BEDTIME.   levothyroxine 112 MCG tablet Commonly known as: SYNTHROID Take 112 mcg by mouth daily before breakfast.   loratadine 10 MG tablet Commonly known as: CLARITIN Take 10 mg by mouth daily.   melatonin 3 MG Tabs tablet Take  3 mg by mouth at bedtime as needed (sleep).   metoprolol tartrate 50 MG tablet Commonly known as: LOPRESSOR Take 1 tablet (50 mg total) by mouth 2 (two) times daily. What changed:   medication strength  how much to take   omega-3 acid ethyl esters 1 g capsule Commonly known as: LOVAZA Take 1 g by mouth daily.   pantoprazole 40 MG tablet Commonly known as: PROTONIX Take 40 mg by mouth daily.   sildenafil 20 MG tablet Commonly known as: REVATIO Take 20 mg by mouth 3 (three) times daily.   tamsulosin 0.4 MG Caps capsule Commonly known as: FLOMAX Take 0.4 mg by mouth daily.   tiotropium 18 MCG inhalation capsule Commonly known as: SPIRIVA Place 18 mcg into inhaler and inhale daily.   torsemide 20 MG tablet Commonly known as: DEMADEX Take 1 tablet (20 mg total) by mouth daily as needed (for swelling). What changed:   when to take this  reasons to take this         Time coordinating discharge:34 minutes  Signed:  Jennye Boroughs  [facility] 01/21/2020, 12:47 PM   Pager on [email or web address]. If 7PM-7AM, please contact night-coverage at [email or web address]

## 2020-01-21 NOTE — TOC Progression Note (Signed)
Transition of Care [facility]) - Progression Note    Patient Details  Name: Derek Shelton MRN: [id number] Date of Birth: [birth year]  Transition of [facility]) CM/SW Contact  Maree Krabbe, LCSW Phone Number: 01/21/2020, 1:25 PM  Clinical Narrative:   HH arranged through [facility]. Pt's son is trying to identify someone to come pick pt up.    Expected Discharge Plan: Home w Home Health Services Barriers to Discharge: Continued Medical Work up  Expected Discharge Plan and Services Expected Discharge Plan: Home w Home Health Services In-house Referral: Clinical Social Work   Post Acute Care Choice: Home Health Living arrangements for the past 2 months: Single Family Home Expected Discharge Date: 01/21/20                                     Social Determinants of Health (SDOH) Interventions    Readmission Risk Interventions Readmission Risk Prevention Plan 01/15/2020  Transportation Screening Complete  Medication Review [employer]) Complete  PCP or Specialist appointment within 3-5 days of discharge Complete  HRI or Home Care Consult Complete  SW Recovery Care/Counseling Consult Complete  Palliative Care Screening Not Applicable  Skilled Nursing Facility Not Applicable  Some recent data might be hidden

## 2020-01-22 ENCOUNTER — Other Ambulatory Visit: Payer: Self-pay | Admitting: Urology

## 2020-02-06 ENCOUNTER — Other Ambulatory Visit: Payer: Self-pay

## 2020-02-06 ENCOUNTER — Emergency Department: Payer: Medicare Other

## 2020-02-06 DIAGNOSIS — I2510 Atherosclerotic heart disease of native coronary artery without angina pectoris: Secondary | ICD-10-CM | POA: Insufficient documentation

## 2020-02-06 DIAGNOSIS — N1831 Chronic kidney disease, stage 3a: Secondary | ICD-10-CM | POA: Insufficient documentation

## 2020-02-06 DIAGNOSIS — Z7901 Long term (current) use of anticoagulants: Secondary | ICD-10-CM | POA: Insufficient documentation

## 2020-02-06 DIAGNOSIS — Z794 Long term (current) use of insulin: Secondary | ICD-10-CM | POA: Insufficient documentation

## 2020-02-06 DIAGNOSIS — R079 Chest pain, unspecified: Secondary | ICD-10-CM | POA: Insufficient documentation

## 2020-02-06 DIAGNOSIS — Z87891 Personal history of nicotine dependence: Secondary | ICD-10-CM | POA: Insufficient documentation

## 2020-02-06 DIAGNOSIS — R609 Edema, unspecified: Secondary | ICD-10-CM | POA: Diagnosis present

## 2020-02-06 DIAGNOSIS — E039 Hypothyroidism, unspecified: Secondary | ICD-10-CM | POA: Diagnosis not present

## 2020-02-06 DIAGNOSIS — I130 Hypertensive heart and chronic kidney disease with heart failure and stage 1 through stage 4 chronic kidney disease, or unspecified chronic kidney disease: Secondary | ICD-10-CM | POA: Insufficient documentation

## 2020-02-06 DIAGNOSIS — J449 Chronic obstructive pulmonary disease, unspecified: Secondary | ICD-10-CM | POA: Diagnosis not present

## 2020-02-06 DIAGNOSIS — F0390 Unspecified dementia without behavioral disturbance: Secondary | ICD-10-CM | POA: Insufficient documentation

## 2020-02-06 DIAGNOSIS — E1122 Type 2 diabetes mellitus with diabetic chronic kidney disease: Secondary | ICD-10-CM | POA: Insufficient documentation

## 2020-02-06 DIAGNOSIS — Z79899 Other long term (current) drug therapy: Secondary | ICD-10-CM | POA: Insufficient documentation

## 2020-02-06 DIAGNOSIS — Z8616 Personal history of COVID-19: Secondary | ICD-10-CM | POA: Insufficient documentation

## 2020-02-06 DIAGNOSIS — J45909 Unspecified asthma, uncomplicated: Secondary | ICD-10-CM | POA: Diagnosis not present

## 2020-02-06 DIAGNOSIS — I5033 Acute on chronic diastolic (congestive) heart failure: Secondary | ICD-10-CM | POA: Diagnosis not present

## 2020-02-06 LAB — BASIC METABOLIC PANEL
Anion gap: 14 (ref 5–15)
BUN: 50 mg/dL — ABNORMAL HIGH (ref 8–23)
CO2: 26 mmol/L (ref 22–32)
Calcium: 8.5 mg/dL — ABNORMAL LOW (ref 8.9–10.3)
Chloride: 95 mmol/L — ABNORMAL LOW (ref 98–111)
Creatinine, Ser: 1.84 mg/dL — ABNORMAL HIGH (ref 0.61–1.24)
GFR, Estimated: 36 mL/min — ABNORMAL LOW (ref >60)
Glucose, Bld: 118 mg/dL — ABNORMAL HIGH (ref 70–99)
Potassium: 3.4 mmol/L — ABNORMAL LOW (ref 3.5–5.1)
Sodium: 135 mmol/L (ref 135–145)

## 2020-02-06 LAB — CBC
HCT: 26.6 % — ABNORMAL LOW (ref 39.0–52.0)
Hemoglobin: 8 g/dL — ABNORMAL LOW (ref 13.0–17.0)
MCH: 26.8 pg (ref 26.0–34.0)
MCHC: 30.1 g/dL (ref 30.0–36.0)
MCV: 89.3 fL (ref 80.0–100.0)
Platelets: 184 10*3/uL (ref 150–400)
RBC: 2.98 MIL/uL — ABNORMAL LOW (ref 4.22–5.81)
RDW: 15.8 % — ABNORMAL HIGH (ref 11.5–15.5)
WBC: 6 10*3/uL (ref 4.0–10.5)
nRBC: 0 % (ref 0.0–0.2)

## 2020-02-06 LAB — TROPONIN I (HIGH SENSITIVITY)
Troponin I (High Sensitivity): 18 ng/L — ABNORMAL HIGH (ref <18)
Troponin I (High Sensitivity): 18 ng/L — ABNORMAL HIGH (ref <18)

## 2020-02-06 NOTE — ED Triage Notes (Signed)
Pt comes pov with son with chest pain and feet swelling. Central chest pain starting about an hour ago. Swelling has been for 3 days.

## 2020-02-07 ENCOUNTER — Emergency Department
Admission: EM | Admit: 2020-02-07 | Discharge: 2020-02-07 | Disposition: A | Discharge status: Home / Self Care | Payer: Medicare Other | Attending: Emergency Medicine | Admitting: Emergency Medicine

## 2020-02-07 DIAGNOSIS — Z8679 Personal history of other diseases of the circulatory system: Secondary | ICD-10-CM

## 2020-02-07 DIAGNOSIS — R079 Chest pain, unspecified: Secondary | ICD-10-CM

## 2020-02-07 DIAGNOSIS — R600 Localized edema: Secondary | ICD-10-CM

## 2020-02-07 MED ORDER — METOLAZONE 2.5 MG PO TABS
2.5000 mg | ORAL_TABLET | Freq: Every day | ORAL | 0 refills | Status: AC
Start: 2020-02-07 — End: 2020-09-20

## 2020-02-07 NOTE — Discharge Instructions (Signed)
Please start taking the metolazone every day instead of every other day for the next 5 days and monitor total liquid input

## 2020-02-07 NOTE — ED Provider Notes (Signed)
[facility] Emergency Department Provider Note   ____________________________________________   Event Date/Time   First MD Initiated Contact with Patient 02/07/20 [id number]     (approximate)  I have reviewed the triage vital signs and the nursing notes.   HISTORY  Chief Complaint Chest Pain and Foot Swelling    HPI Derek Shelton is a 80 y.o. male with a past medical history of chronic anemia, CHF, COPD, and A. fib who presents for chest pain and bilateral lower extremity edema over the past 3 days.  States that the symptoms have been worsening over this time.  Denies any significant shortness of breath but does endorse central chest tightness that has improved during my evaluation.  Patient denies any specific exacerbating or relieving factors for this chest pain.  Patient currently denies any vision changes, tinnitus, difficulty speaking, facial droop, sore throat, shortness of breath, abdominal pain, nausea/vomiting/diarrhea, dysuria, or weakness/numbness/paresthesias in any extremity         Past Medical History:  Diagnosis Date  . Anemia   . Asthma   . CHF (congestive heart failure) (HCC)   . COPD (chronic obstructive pulmonary disease) (HCC)   . Coronary artery disease   . Dementia (HCC)    Per son's report  . Diabetes mellitus without complication (HCC)   . Dysrhythmia    Atrial Fibrillation  . GERD (gastroesophageal reflux disease)   . GI bleed   . Hyperlipidemia   . Hypertension   . Hypothyroidism   . Shortness of breath dyspnea   . Sleep apnea   . Stroke (HCC)   . Thyroid disease     Patient Active Problem List   Diagnosis Date Noted  . Acute respiratory disease due to COVID-19 virus 01/14/2020  . COPD (chronic obstructive pulmonary disease) (HCC)   . Hypothyroidism   . Acute respiratory failure with hypoxia (HCC) 08/06/2019  . Acute on chronic diastolic CHF (congestive heart failure) (HCC) 08/06/2019  . History of GI bleed  08/06/2019  . History of bradycardia 08/06/2019  . Moderate tricuspid regurgitation 08/06/2019  . PAH (pulmonary artery hypertension) (HCC) 08/06/2019  . History of CVA (cerebrovascular accident) 08/06/2019  . LLL pneumonia 08/06/2019  . HCAP (healthcare-associated pneumonia) 08/06/2019  . Acute CHF (congestive heart failure) (HCC) 08/06/2019  . Dementia without behavioral disturbance (HCC)   . Normocytic anemia 07/06/2019  . Hypokalemia 04/02/2019  . Proteinuria 03/04/2019  . Retention of urine 03/04/2019  . Chronic kidney disease, stage 3a (HCC) 03/04/2019  . Type II diabetes mellitus with renal manifestations (HCC) 03/04/2019  . Chronic kidney disease, stage 2 (mild) 01/04/2019  . 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 BPH (benign prostatic hyperplasia) 07/18/2018  . Degeneration of lumbar intervertebral disc 07/17/2018  . Osteoarthritis of knee 07/17/2018  . Spinal stenosis of lumbar region 07/17/2018  . Acute CVA (cerebrovascular accident) (HCC) 12/02/2017  . Acute ischemic stroke (HCC) 12/02/2017  . Acute metabolic encephalopathy due to hypoglycemia 11/29/2017  . Essential hypertension 11/29/2017  . Syncope and collapse 11/28/2017  . Atrial fibrillation, chronic (HCC) 05/22/2017  . Chest pain 02/21/2017  . AKI (acute kidney injury) (HCC) 02/21/2017  . SOB (shortness of breath) 02/08/2017  . Atrial fibrillation with RVR (HCC) 02/08/2017  . Hyperlipidemia 09/23/2014  . GI bleeding 08/06/2014  . Anemia 08/06/2014  . Bradycardia 08/06/2014  . Atrial fibrillation (HCC) 08/06/2014  . Hyponatremia 08/06/2014  . Chronic diastolic heart failure (HCC) 08/06/2014  . OSA (obstructive sleep apnea) 08/06/2014  .  Memory loss or impairment 05/29/2014  . Current smoker 09/19/2013  . Asthma 11/29/2012  . Dizziness 11/15/2012  . Other dyspnea and respiratory abnormality 11/15/2012  . Pain in joint involving lower leg 01/05/2011  . Type 2 diabetes mellitus without  complication ([place]) 0000000  . 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 Chronic rhinitis 08/11/2010  . Coronary artery disease 08/11/2010  . Esophageal reflux 08/11/2010  . Personal history of arthritis 08/11/2010  . Tear film insufficiency 06/07/2010  . 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 Lens replaced 03/10/2010  . Pterygium 03/10/2010  . Gastro-esophageal reflux disease with esophagitis 01/30/2006    Past Surgical History:  Procedure Laterality Date  . CARDIAC CATHETERIZATION    . COLONOSCOPY N/A 08/10/2014   Procedure: COLONOSCOPY;  Surgeon: Manya Silvas, MD;  Location: [facility] ENDOSCOPY;  Service: Endoscopy;  Laterality: N/A;  . COLONOSCOPY WITH PROPOFOL N/A 04/05/2016   Procedure: COLONOSCOPY WITH PROPOFOL;  Surgeon: Lollie Sails, MD;  Location: [facility] ENDOSCOPY;  Service: Endoscopy;  Laterality: N/A;  . COLONOSCOPY WITH PROPOFOL N/A 07/10/2019   Procedure: COLONOSCOPY WITH PROPOFOL;  Surgeon: Lucilla Lame, MD;  Location: [facility] ENDOSCOPY;  Service: Endoscopy;  Laterality: N/A;  . ESOPHAGOGASTRODUODENOSCOPY N/A 08/08/2014   Procedure: ESOPHAGOGASTRODUODENOSCOPY (EGD);  Surgeon: Manya Silvas, MD;  Location: [facility] ENDOSCOPY;  Service: Endoscopy;  Laterality: N/A;  plan for early afternoon  . ESOPHAGOGASTRODUODENOSCOPY (EGD) WITH PROPOFOL N/A 07/10/2019   Procedure: ESOPHAGOGASTRODUODENOSCOPY (EGD) WITH PROPOFOL;  Surgeon: Lucilla Lame, MD;  Location: [facility] ENDOSCOPY;  Service: Endoscopy;  Laterality: N/A;  . EYE SURGERY    . GIVENS CAPSULE STUDY N/A 08/12/2014   Procedure: GIVENS CAPSULE STUDY;  Surgeon: Manya Silvas, MD;  Location: [facility] ENDOSCOPY;  Service: Endoscopy;  Laterality: N/A;  . rectal fistula N/A     Prior to Admission medications   Medication Sig Start Date End Date Taking? Authorizing Provider  metolazone (ZAROXOLYN) 2.5 MG tablet Take 1 tablet (2.5 mg total) by mouth daily for 5 days. 02/07/20 02/12/20 Yes  Royetta Probus, Vista Lawman, MD  ACCU-CHEK GUIDE test strip E11.9   TO CHECK BLOOD GLUCOSE TWICE A DAY 08/06/19   [provider]  atorvastatin (LIPITOR) 40 MG tablet Take 1 tablet (40 mg total) by mouth daily at 6 PM. 12/03/17   Demetrios Loll, MD  BD PEN NEEDLE NANO 2ND GEN 32G X 4 MM MISC USE NIGHTLY WITH TRESIBA 08/28/19   [provider]  budesonide (PULMICORT) 0.5 MG/2ML nebulizer solution Inhale 0.5 mg into the lungs 2 (two) times daily. For 10 days 12/11/19   [provider]  budesonide-formoterol (SYMBICORT) 160-4.5 MCG/ACT inhaler Inhale 2 puffs into the lungs 2 (two) times daily.    [provider]  diltiazem (CARDIZEM CD) 180 MG 24 hr capsule Take 1 capsule (180 mg total) by mouth daily. 01/21/20 01/20/21  Jennye Boroughs, MD  donepezil (ARICEPT) 5 MG tablet Take 5 mg by mouth at bedtime.    [provider]  DULoxetine (CYMBALTA) 60 MG capsule Take 60 mg by mouth daily.    [provider]  ELIQUIS 5 MG TABS tablet Take 5 mg by mouth 2 (two) times daily. 12/09/19   [provider]  ergocalciferol (VITAMIN D2) 1.25 MG (50000 UT) capsule Take 50,000 Units by mouth once a week.    [provider]  ferrous sulfate 325 (65 FE) MG tablet Take 325 mg by mouth daily. 12/07/19   [provider]  formoterol (PERFOROMIST) 20 MCG/2ML  nebulizer solution Inhale 2 mLs into the lungs 2 (two) times daily. 12/16/19 12/15/20  [provider]  insulin degludec (TRESIBA) 100 UNIT/ML FlexTouch Pen Inject 30 Units into the skin at bedtime.     [provider]  ipratropium-albuterol (DUONEB) 0.5-2.5 (3) MG/3ML SOLN Inhale 3 mLs into the lungs every 6 (six) hours as needed for shortness of breath. 01/22/17   [provider]  latanoprost (XALATAN) 0.005 % ophthalmic solution latanoprost 0.005 % eye drops  INSTILL ONE DROP TO BOTH EYES AT BEDTIME.    [provider]  levothyroxine (SYNTHROID, LEVOTHROID) 112 MCG tablet  Take 112 mcg by mouth daily before breakfast.     [provider]  loratadine (CLARITIN) 10 MG tablet Take 10 mg by mouth daily. 12/17/19   [provider]  Melatonin 3 MG TABS Take 3 mg by mouth at bedtime as needed (sleep).     [provider]  metoprolol tartrate (LOPRESSOR) 50 MG tablet Take 1 tablet (50 mg total) by mouth 2 (two) times daily. 01/21/20   Jennye Boroughs, MD  omega-3 acid ethyl esters (LOVAZA) 1 G capsule Take 1 g by mouth daily.     [provider]  pantoprazole (PROTONIX) 40 MG tablet Take 40 mg by mouth daily. 06/04/19   [provider]  sildenafil (REVATIO) 20 MG tablet Take 20 mg by mouth 3 (three) times daily. 05/09/19   [provider]  tamsulosin (FLOMAX) 0.4 MG CAPS capsule Take 0.4 mg by mouth daily. 05/07/19   [provider]  tiotropium (SPIRIVA) 18 MCG inhalation capsule Place 18 mcg into inhaler and inhale daily.    [provider]  torsemide (DEMADEX) 20 MG tablet Take 1 tablet (20 mg total) by mouth daily as needed (for swelling). 01/21/20   Jennye Boroughs, MD    Allergies Carvedilol, Penicillins, Aspirin, Aricept [donepezil hcl], and Catapres [clonidine hcl]  Family History  Problem Relation Age of Onset  . Diabetes Mother     Social History Social History   Tobacco Use  . Smoking status: Former Smoker    Quit date: 2000    Years since quitting: 22.0  . Smokeless tobacco: Former Systems developer    Types: Secondary school teacher  . Vaping Use: Never used  Substance Use Topics  . Alcohol use: No  . Drug use: No    Review of Systems Constitutional: No fever/chills Eyes: No visual changes. ENT: No sore throat. Cardiovascular: Endorses chest pain. Respiratory: Denies shortness of breath. Gastrointestinal: No abdominal pain.  No nausea, no vomiting.  No diarrhea. Genitourinary: Negative for dysuria. Musculoskeletal: Negative for acute arthralgias Skin: Negative for rash. Neurological:  Negative for headaches, weakness/numbness/paresthesias in any extremity Psychiatric: Negative for suicidal ideation/homicidal ideation   ____________________________________________   PHYSICAL EXAM:  VITAL SIGNS: ED Triage Vitals  Enc Vitals Group     BP 02/06/20 1812 (!) 137/95     Pulse Rate 02/06/20 1812 (!) 117     Resp 02/06/20 1812 20     Temp 02/06/20 1812 97.8 F (36.6 C)     Temp Source 02/06/20 1812 Oral     SpO2 02/06/20 1812 98 %     Weight 02/06/20 1810 200 lb 9.9 oz (91 kg)     Height 02/06/20 1810 5\' 5"  (1.651 m)     Head Circumference --      Peak Flow --      Pain Score 02/06/20 1810 8     Pain Loc --  Pain Edu? --      Excl. in [place]? --    Constitutional: Alert and oriented. Well appearing and in no acute distress. Eyes: Conjunctivae are normal. PERRL. Head: Atraumatic. Nose: No congestion/rhinnorhea. Mouth/Throat: Mucous membranes are moist. Neck: No stridor Cardiovascular: Grossly normal heart sounds.  Good peripheral circulation. Respiratory: Normal respiratory effort.  No retractions. Gastrointestinal: Soft and nontender. No distention. Musculoskeletal: No obvious deformities Neurologic:  Normal speech and language. No gross focal neurologic deficits are appreciated. Skin:  Skin is warm and dry. No rash noted.  Bilateral lower extremity pitting edema Psychiatric: Mood and affect are normal. Speech and behavior are normal.  ____________________________________________   LABS (all labs ordered are listed, but only abnormal results are displayed)  Labs Reviewed  BASIC METABOLIC PANEL - Abnormal; Notable for the following components:      Result Value   Potassium 3.4 (*)    Chloride 95 (*)    Glucose, Bld 118 (*)    BUN 50 (*)    Creatinine, Ser 1.84 (*)    Calcium 8.5 (*)    GFR, Estimated 36 (*)    All other components within normal limits  CBC - Abnormal; Notable for the following components:   RBC 2.98 (*)    Hemoglobin 8.0 (*)     HCT 26.6 (*)    RDW 15.8 (*)    All other components within normal limits  TROPONIN I (HIGH SENSITIVITY) - Abnormal; Notable for the following components:   Troponin I (High Sensitivity) 18 (*)    All other components within normal limits  TROPONIN I (HIGH SENSITIVITY) - Abnormal; Notable for the following components:   Troponin I (High Sensitivity) 18 (*)    All other components within normal limits   ____________________________________________  EKG  ED ECG REPORT I, Naaman Plummer, the attending physician, personally viewed and interpreted this ECG.  Date: 02/06/2020 EKG Time: 1759 Rate: 100 Rhythm: normal sinus rhythm QRS Axis: normal Intervals: normal ST/T Wave abnormalities: normal Narrative Interpretation: no evidence of acute ischemia  ____________________________________________  RADIOLOGY  ED MD interpretation: 2 view x-ray of the chest shows cardiomegaly without any focal airspace disease, pulmonary edema, pneumonia, pneumothorax, or widened mediastinum  Official radiology report(s): DG Chest 2 View  Result Date: 02/06/2020 CLINICAL DATA:  Shortness of breath EXAM: CHEST - 2 VIEW COMPARISON:  01/16/2020 FINDINGS: Unchanged cardiomegaly. No focal airspace consolidation or pulmonary edema. No pleural effusion or pneumothorax. IMPRESSION: Cardiomegaly without focal airspace disease. Electronically Signed   By: Ulyses Jarred M.D.   On: 02/06/2020 18:54    ____________________________________________   PROCEDURES  Procedure(s) performed (including Critical Care):  Procedures   ____________________________________________   INITIAL IMPRESSION / ASSESSMENT AND PLAN / ED COURSE  As part of my medical decision making, I reviewed the following data within the [facility] notes reviewed and incorporated, Labs reviewed, EKG interpreted, Old chart reviewed, Radiograph reviewed and Notes from prior ED visits reviewed and incorporated       Denies dyspnea positive LE edema Denies Non adherence to medication regimen  Workup: ECG, CBC, BMP, Troponin, BNP, CXR Findings: EKG: No STEMI and no evidence of Brugadas sign, delta wave, epsilon wave, significantly prolonged QTc, or malignant arrhythmia. CXR: No evidence of acute pulmonary edema.  Cardiomegaly stable Based on history, exam and findings, presentation most consistent with acute on chronic heart failure. Low suspicion for PNA, ACS, tamponade, aortic dissection. Interventions: Oxygen, Diuresis  Reassessment: Symptoms improved in ED with oxygen and  diuresis  Disposition (Stable): Discharge      ____________________________________________   FINAL CLINICAL IMPRESSION(S) / ED DIAGNOSES  Final diagnoses:  Bilateral lower extremity edema  Nonspecific chest pain  History of CHF (congestive heart failure)     ED Discharge Orders         Ordered    metolazone (ZAROXOLYN) 2.5 MG tablet  Daily        02/07/20 0105           Note:  This document was prepared using Dragon voice recognition software and may include unintentional dictation errors.   Naaman Plummer, MD 02/07/20 0111

## 2020-03-18 ENCOUNTER — Ambulatory Visit
Admission: RE | Admit: 2020-03-18 | Discharge: 2020-03-18 | Disposition: A | Discharge status: Home / Self Care | Payer: Medicare Other | Source: Ambulatory Visit | Attending: Internal Medicine | Admitting: Internal Medicine

## 2020-03-18 ENCOUNTER — Other Ambulatory Visit: Payer: Self-pay

## 2020-03-18 DIAGNOSIS — D539 Nutritional anemia, unspecified: Secondary | ICD-10-CM | POA: Insufficient documentation

## 2020-03-18 DIAGNOSIS — Z79899 Other long term (current) drug therapy: Secondary | ICD-10-CM | POA: Insufficient documentation

## 2020-03-18 DIAGNOSIS — Z7901 Long term (current) use of anticoagulants: Secondary | ICD-10-CM | POA: Insufficient documentation

## 2020-03-18 DIAGNOSIS — I4891 Unspecified atrial fibrillation: Secondary | ICD-10-CM | POA: Insufficient documentation

## 2020-03-18 DIAGNOSIS — I509 Heart failure, unspecified: Secondary | ICD-10-CM | POA: Insufficient documentation

## 2020-03-18 DIAGNOSIS — J449 Chronic obstructive pulmonary disease, unspecified: Secondary | ICD-10-CM | POA: Diagnosis not present

## 2020-03-18 DIAGNOSIS — F0390 Unspecified dementia without behavioral disturbance: Secondary | ICD-10-CM | POA: Insufficient documentation

## 2020-03-18 LAB — PREPARE RBC (CROSSMATCH)

## 2020-03-18 LAB — HEMOGLOBIN AND HEMATOCRIT, BLOOD
HCT: 26.6 % — ABNORMAL LOW (ref 39.0–52.0)
Hemoglobin: 8.2 g/dL — ABNORMAL LOW (ref 13.0–17.0)

## 2020-03-18 MED ORDER — METOLAZONE 5 MG PO TABS
5.0000 mg | ORAL_TABLET | Freq: Once | ORAL | Status: AC
  Administered 2020-03-18: 5 mg via ORAL
  Filled 2020-03-18: qty 1

## 2020-03-18 MED ORDER — SODIUM CHLORIDE FLUSH 0.9 % IV SOLN
INTRAVENOUS | Status: AC
  Filled 2020-03-18: qty 10

## 2020-03-18 MED ORDER — SODIUM CHLORIDE (PF) 0.9 % IJ SOLN
INTRAMUSCULAR | Status: AC
  Filled 2020-03-18: qty 10

## 2020-03-18 MED ORDER — TORSEMIDE 20 MG PO TABS
20.0000 mg | ORAL_TABLET | Freq: Every day | ORAL | Status: DC
  Administered 2020-03-18: 20 mg via ORAL
  Filled 2020-03-18: qty 1

## 2020-03-18 MED ORDER — SODIUM CHLORIDE 0.9% IV SOLUTION: Freq: Once | INTRAVENOUS | Status: DC

## 2020-03-19 LAB — TYPE AND SCREEN
ABO/RH(D): A POS
Antibody Screen: NEGATIVE
Unit division: 0
Unit division: 0

## 2020-03-19 LAB — BPAM RBC
Blood Product Expiration Date: 202203232359
Blood Product Expiration Date: 202203232359
ISSUE DATE / TIME: 202202230929
ISSUE DATE / TIME: 202202231425
Unit Type and Rh: 6200
Unit Type and Rh: 6200

## 2020-03-21 LAB — PREPARE RBC (CROSSMATCH)

## 2020-05-28 DIAGNOSIS — I2510 Atherosclerotic heart disease of native coronary artery without angina pectoris: Secondary | ICD-10-CM | POA: Diagnosis not present

## 2020-05-28 DIAGNOSIS — I10 Essential (primary) hypertension: Secondary | ICD-10-CM | POA: Diagnosis not present

## 2020-05-28 DIAGNOSIS — J449 Chronic obstructive pulmonary disease, unspecified: Secondary | ICD-10-CM | POA: Diagnosis not present

## 2020-05-28 DIAGNOSIS — I2721 Secondary pulmonary arterial hypertension: Secondary | ICD-10-CM | POA: Diagnosis not present

## 2020-05-28 DIAGNOSIS — R0602 Shortness of breath: Secondary | ICD-10-CM | POA: Diagnosis not present

## 2020-05-28 DIAGNOSIS — Z87898 Personal history of other specified conditions: Secondary | ICD-10-CM | POA: Diagnosis not present

## 2020-05-28 DIAGNOSIS — I5032 Chronic diastolic (congestive) heart failure: Secondary | ICD-10-CM | POA: Diagnosis not present

## 2020-06-04 IMAGING — US ULTRASOUND ABDOMEN LIMITED
1 series · 14 of 24 positions shown · non-contrast
Comparison: Abdominal ultrasound 05/01/2017, CT abdomen pelvis
02/21/2017

CLINICAL DATA: Right upper quadrant pain for 20 years

EXAM:
ULTRASOUND ABDOMEN LIMITED RIGHT UPPER QUADRANT

[Series 1: ultrasound abdomen limited · 14 of 24 slices shown]
[im 1/24]
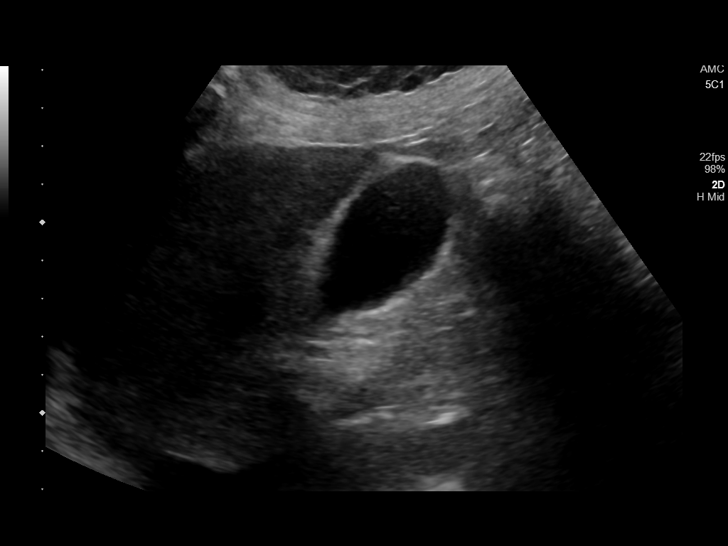
[im 3/24]
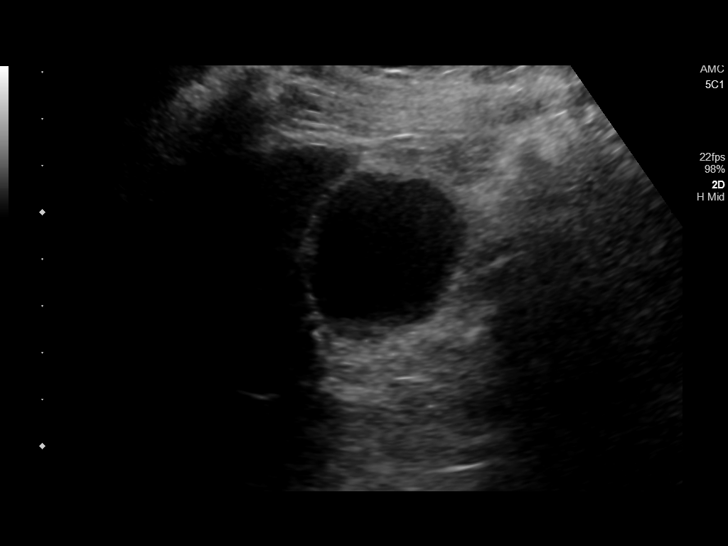
[im 5/24]
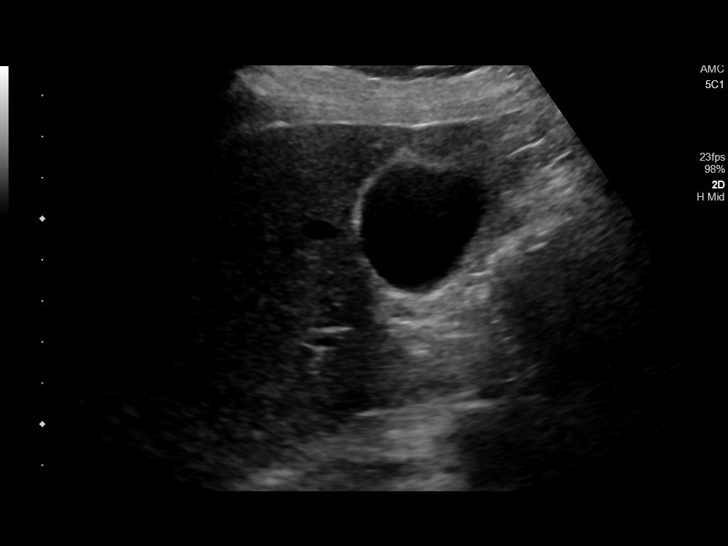
[im 7/24]
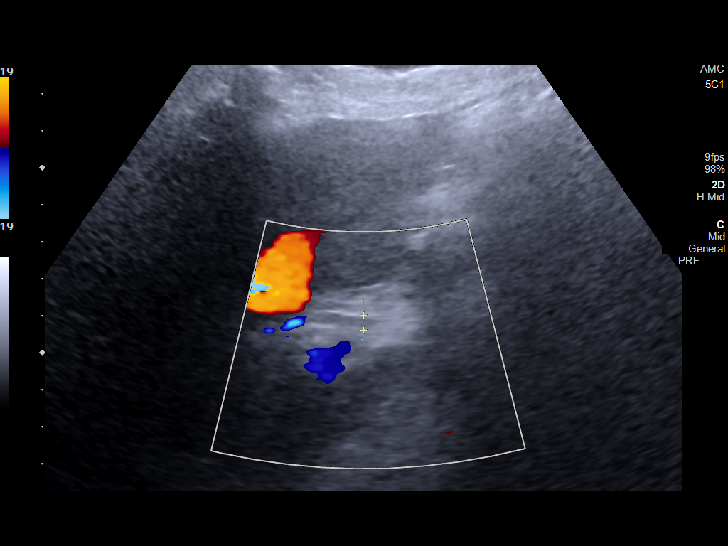
[im 8/24]
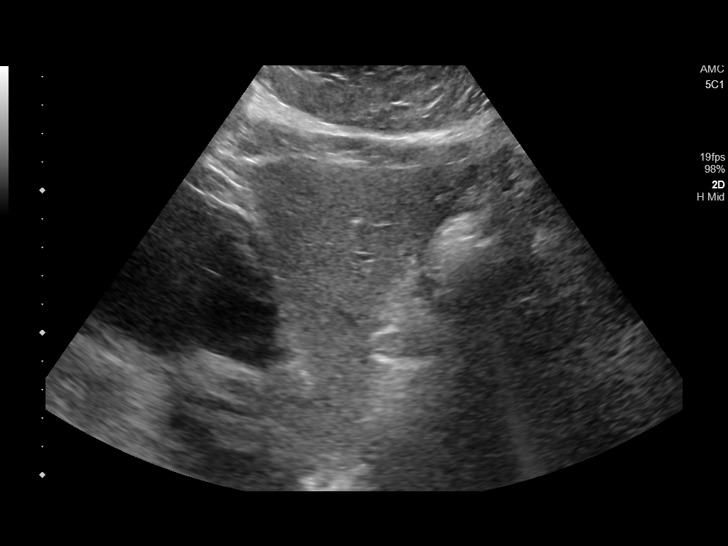
[im 10/24]
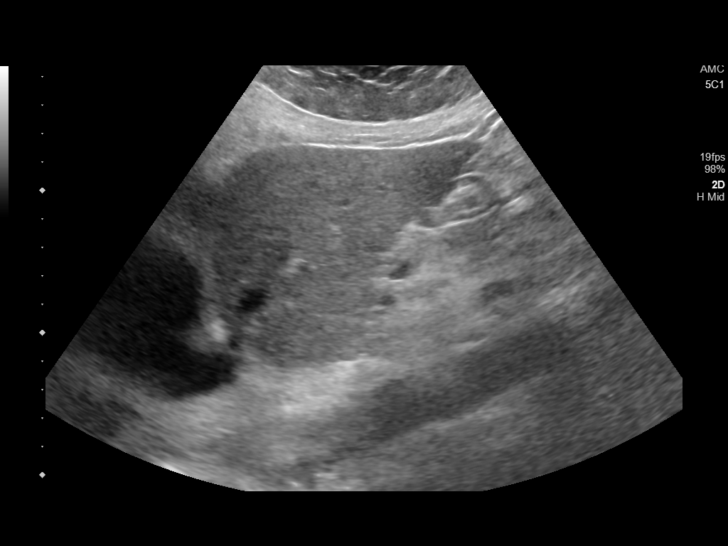
[im 12/24]
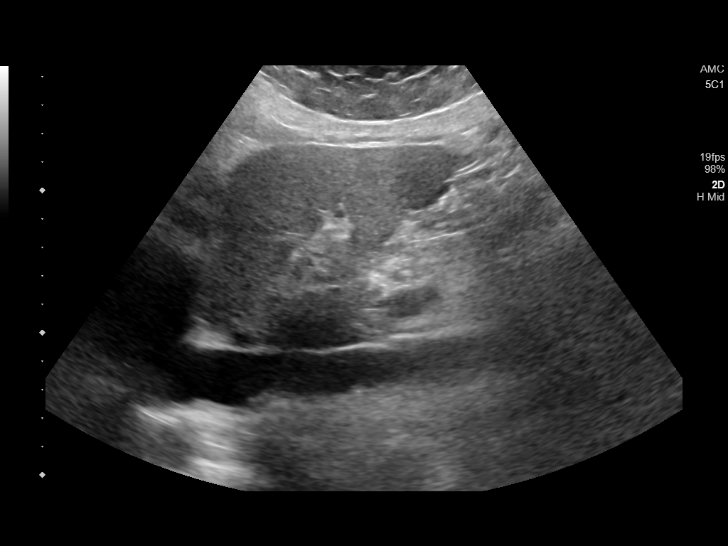
[im 13/24]
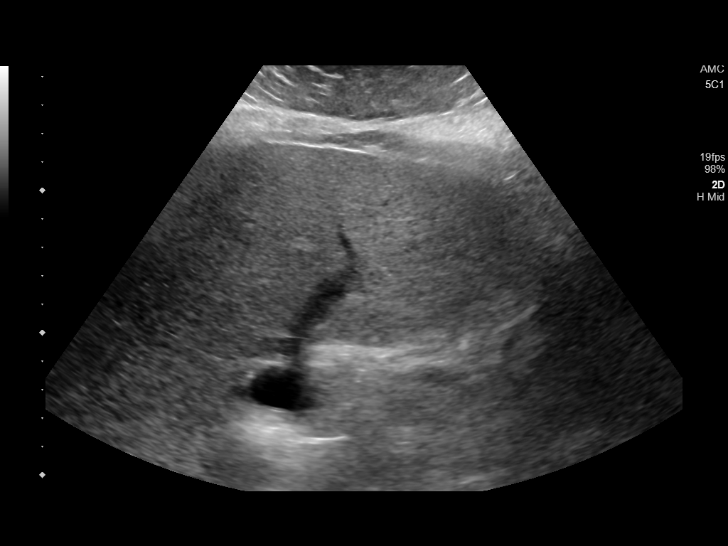
[im 15/24]
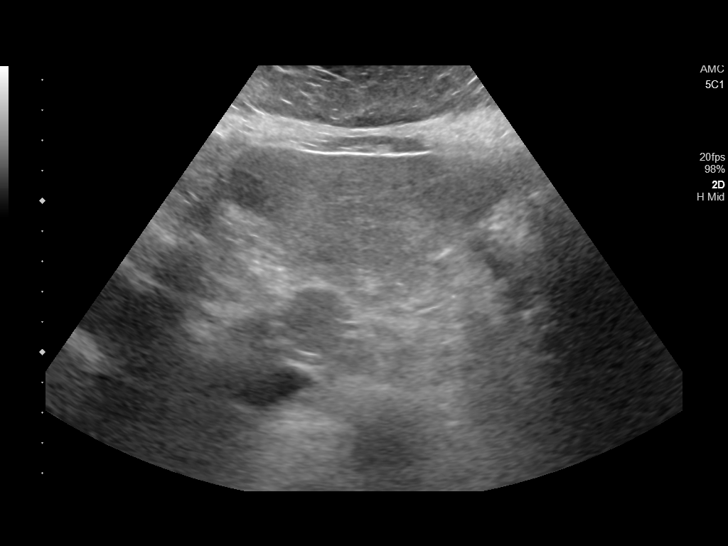
[im 17/24]
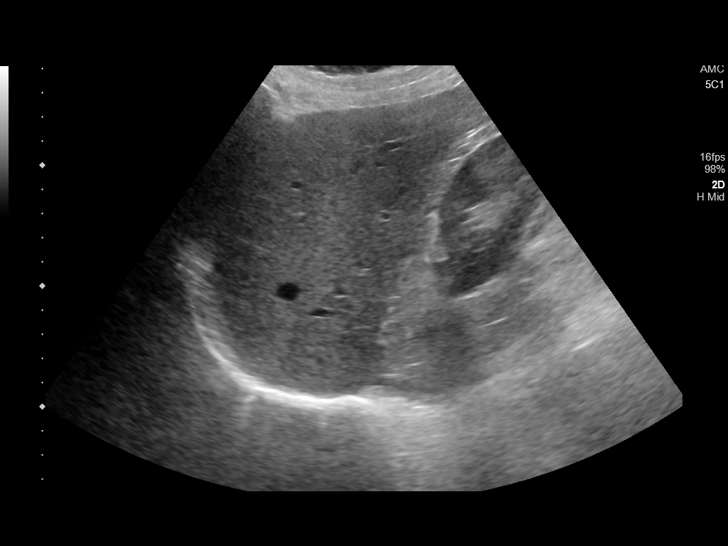
[im 19/24]
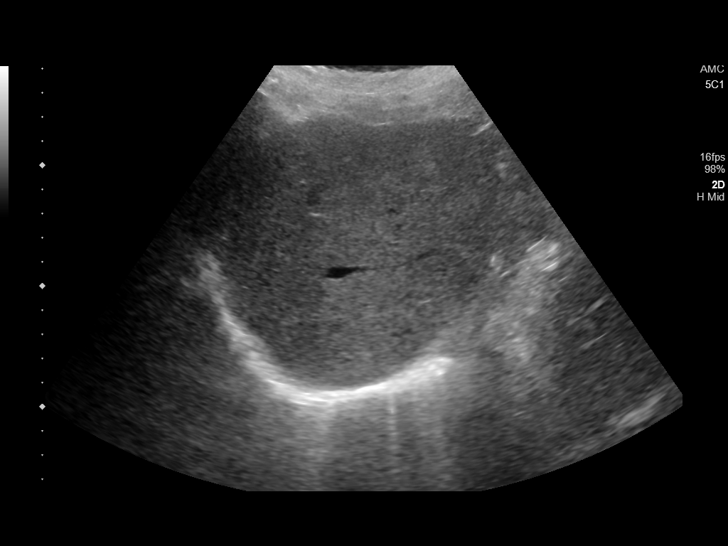
[im 20/24]
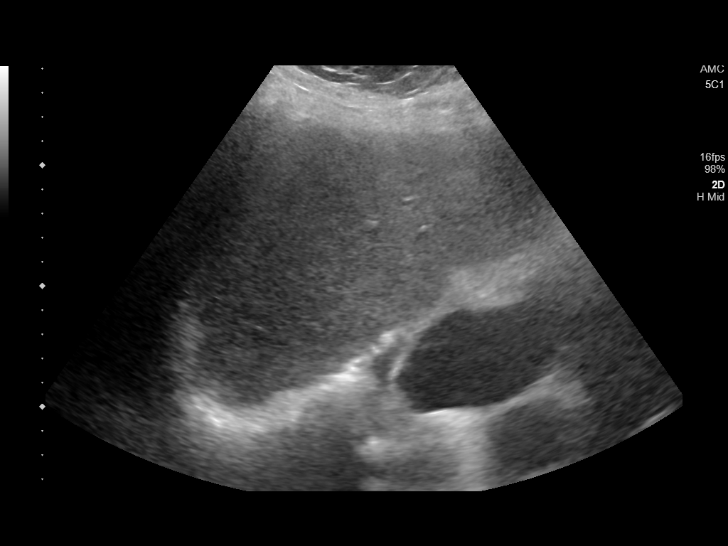
[im 22/24]
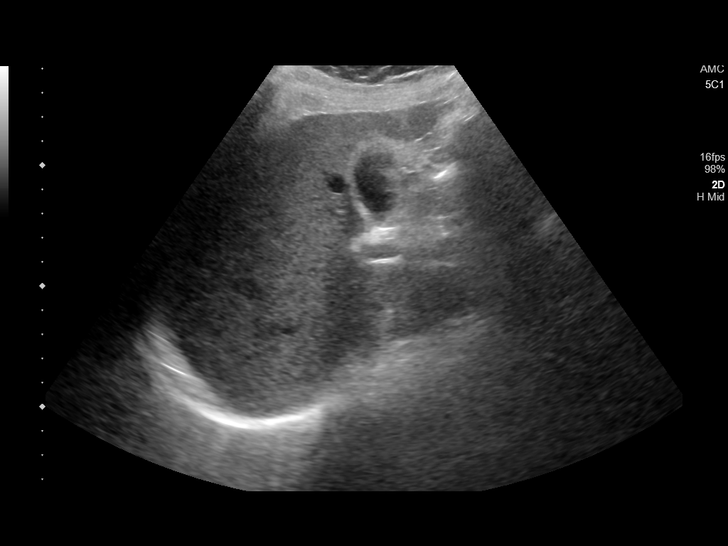
[im 24/24]
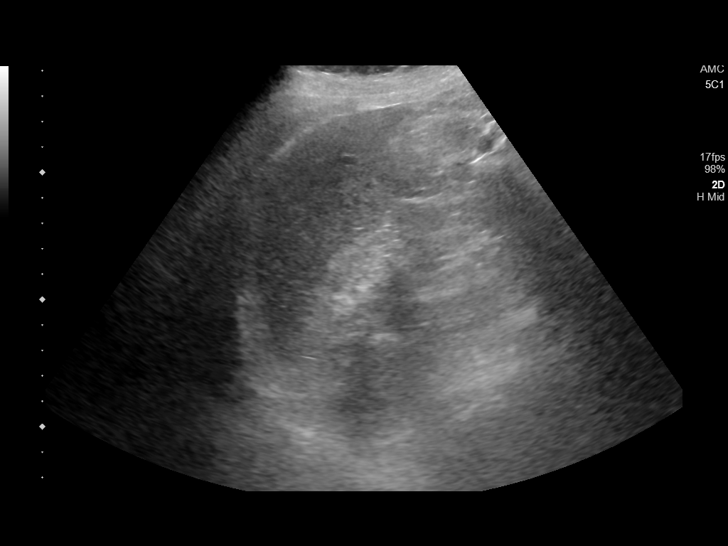

[14 of 24 positions shown; findings below may reference images not displayed]

FINDINGS: Gallbladder:

No gallstones or wall thickening visualized. No sonographic Murphy
sign noted by sonographer.

Common bile duct:

Diameter: 4 mm

Liver:

No focal lesion identified. The liver parenchymal echogenicity is
diffusely increased. Portal vein is patent on color Doppler imaging
with normal direction of blood flow towards the liver.

Other: None.
IMPRESSION: No acute finding in the right upper quadrant. Diffusely increased
liver echogenicity as can be seen in hepatic steatosis.

## 2020-06-09 DIAGNOSIS — E0821 Diabetes mellitus due to underlying condition with diabetic nephropathy: Secondary | ICD-10-CM | POA: Diagnosis not present

## 2020-06-09 DIAGNOSIS — R7989 Other specified abnormal findings of blood chemistry: Secondary | ICD-10-CM | POA: Diagnosis not present

## 2020-06-11 DIAGNOSIS — J0100 Acute maxillary sinusitis, unspecified: Secondary | ICD-10-CM | POA: Diagnosis not present

## 2020-06-11 DIAGNOSIS — I509 Heart failure, unspecified: Secondary | ICD-10-CM | POA: Diagnosis not present

## 2020-06-11 DIAGNOSIS — I482 Chronic atrial fibrillation, unspecified: Secondary | ICD-10-CM | POA: Diagnosis not present

## 2020-06-11 DIAGNOSIS — E0580 Other thyrotoxicosis without thyrotoxic crisis or storm: Secondary | ICD-10-CM | POA: Diagnosis not present

## 2020-07-15 ENCOUNTER — Other Ambulatory Visit: Payer: Self-pay

## 2020-07-15 ENCOUNTER — Inpatient Hospital Stay
Admission: EM | Admit: 2020-07-15 | Discharge: 2020-07-17 | DRG: 683 | Disposition: A | Discharge status: Home / Self Care | Payer: Medicare Other | Attending: Obstetrics and Gynecology | Admitting: Obstetrics and Gynecology

## 2020-07-15 ENCOUNTER — Inpatient Hospital Stay: Payer: Medicare Other

## 2020-07-15 ENCOUNTER — Encounter: Payer: Self-pay | Admitting: Emergency Medicine

## 2020-07-15 DIAGNOSIS — G8929 Other chronic pain: Secondary | ICD-10-CM | POA: Diagnosis present

## 2020-07-15 DIAGNOSIS — J441 Chronic obstructive pulmonary disease with (acute) exacerbation: Secondary | ICD-10-CM | POA: Diagnosis present

## 2020-07-15 DIAGNOSIS — I2510 Atherosclerotic heart disease of native coronary artery without angina pectoris: Secondary | ICD-10-CM | POA: Diagnosis present

## 2020-07-15 DIAGNOSIS — F419 Anxiety disorder, unspecified: Secondary | ICD-10-CM | POA: Diagnosis present

## 2020-07-15 DIAGNOSIS — I480 Paroxysmal atrial fibrillation: Secondary | ICD-10-CM | POA: Diagnosis not present

## 2020-07-15 DIAGNOSIS — I10 Essential (primary) hypertension: Secondary | ICD-10-CM

## 2020-07-15 DIAGNOSIS — Z880 Allergy status to penicillin: Secondary | ICD-10-CM | POA: Diagnosis not present

## 2020-07-15 DIAGNOSIS — F0390 Unspecified dementia without behavioral disturbance: Secondary | ICD-10-CM | POA: Diagnosis present

## 2020-07-15 DIAGNOSIS — N189 Chronic kidney disease, unspecified: Principal | ICD-10-CM

## 2020-07-15 DIAGNOSIS — Z7901 Long term (current) use of anticoagulants: Secondary | ICD-10-CM

## 2020-07-15 DIAGNOSIS — N401 Enlarged prostate with lower urinary tract symptoms: Secondary | ICD-10-CM

## 2020-07-15 DIAGNOSIS — Z833 Family history of diabetes mellitus: Secondary | ICD-10-CM

## 2020-07-15 DIAGNOSIS — D649 Anemia, unspecified: Secondary | ICD-10-CM | POA: Diagnosis not present

## 2020-07-15 DIAGNOSIS — E119 Type 2 diabetes mellitus without complications: Secondary | ICD-10-CM

## 2020-07-15 DIAGNOSIS — N1832 Chronic kidney disease, stage 3b: Secondary | ICD-10-CM | POA: Diagnosis present

## 2020-07-15 DIAGNOSIS — Z794 Long term (current) use of insulin: Secondary | ICD-10-CM | POA: Diagnosis not present

## 2020-07-15 DIAGNOSIS — Z888 Allergy status to other drugs, medicaments and biological substances status: Secondary | ICD-10-CM | POA: Diagnosis not present

## 2020-07-15 DIAGNOSIS — E876 Hypokalemia: Secondary | ICD-10-CM | POA: Diagnosis present

## 2020-07-15 DIAGNOSIS — E039 Hypothyroidism, unspecified: Secondary | ICD-10-CM | POA: Diagnosis present

## 2020-07-15 DIAGNOSIS — E860 Dehydration: Secondary | ICD-10-CM | POA: Diagnosis present

## 2020-07-15 DIAGNOSIS — E871 Hypo-osmolality and hyponatremia: Secondary | ICD-10-CM | POA: Diagnosis not present

## 2020-07-15 DIAGNOSIS — I130 Hypertensive heart and chronic kidney disease with heart failure and stage 1 through stage 4 chronic kidney disease, or unspecified chronic kidney disease: Secondary | ICD-10-CM | POA: Diagnosis present

## 2020-07-15 DIAGNOSIS — I4891 Unspecified atrial fibrillation: Secondary | ICD-10-CM | POA: Diagnosis present

## 2020-07-15 DIAGNOSIS — D631 Anemia in chronic kidney disease: Secondary | ICD-10-CM | POA: Diagnosis present

## 2020-07-15 DIAGNOSIS — E1122 Type 2 diabetes mellitus with diabetic chronic kidney disease: Secondary | ICD-10-CM | POA: Diagnosis present

## 2020-07-15 DIAGNOSIS — I5032 Chronic diastolic (congestive) heart failure: Secondary | ICD-10-CM | POA: Diagnosis present

## 2020-07-15 DIAGNOSIS — Z20822 Contact with and (suspected) exposure to covid-19: Secondary | ICD-10-CM | POA: Diagnosis present

## 2020-07-15 DIAGNOSIS — I5031 Acute diastolic (congestive) heart failure: Secondary | ICD-10-CM | POA: Diagnosis not present

## 2020-07-15 DIAGNOSIS — Z886 Allergy status to analgesic agent status: Secondary | ICD-10-CM | POA: Diagnosis not present

## 2020-07-15 DIAGNOSIS — R338 Other retention of urine: Secondary | ICD-10-CM | POA: Diagnosis present

## 2020-07-15 DIAGNOSIS — I129 Hypertensive chronic kidney disease with stage 1 through stage 4 chronic kidney disease, or unspecified chronic kidney disease: Secondary | ICD-10-CM | POA: Diagnosis not present

## 2020-07-15 DIAGNOSIS — N179 Acute kidney failure, unspecified: Secondary | ICD-10-CM | POA: Diagnosis not present

## 2020-07-15 DIAGNOSIS — K219 Gastro-esophageal reflux disease without esophagitis: Secondary | ICD-10-CM | POA: Diagnosis present

## 2020-07-15 DIAGNOSIS — E785 Hyperlipidemia, unspecified: Secondary | ICD-10-CM | POA: Diagnosis present

## 2020-07-15 DIAGNOSIS — N400 Enlarged prostate without lower urinary tract symptoms: Secondary | ICD-10-CM | POA: Diagnosis present

## 2020-07-15 DIAGNOSIS — R0600 Dyspnea, unspecified: Secondary | ICD-10-CM

## 2020-07-15 DIAGNOSIS — Z87891 Personal history of nicotine dependence: Secondary | ICD-10-CM

## 2020-07-15 DIAGNOSIS — R350 Frequency of micturition: Secondary | ICD-10-CM

## 2020-07-15 DIAGNOSIS — Z8673 Personal history of transient ischemic attack (TIA), and cerebral infarction without residual deficits: Secondary | ICD-10-CM

## 2020-07-15 LAB — RESP PANEL BY RT-PCR (FLU A&B, COVID) ARPGX2
Influenza A by PCR: NEGATIVE
Influenza B by PCR: NEGATIVE
SARS Coronavirus 2 by RT PCR: NEGATIVE

## 2020-07-15 LAB — RETICULOCYTES
Immature Retic Fract: 31.6 % — ABNORMAL HIGH (ref 2.3–15.9)
RBC.: 2.17 MIL/uL — ABNORMAL LOW (ref 4.22–5.81)
Retic Count, Absolute: 143 10*3/uL (ref 19.0–186.0)
Retic Ct Pct: 6.6 % — ABNORMAL HIGH (ref 0.4–3.1)

## 2020-07-15 LAB — BASIC METABOLIC PANEL
Anion gap: 9 (ref 5–15)
BUN: 62 mg/dL — ABNORMAL HIGH (ref 8–23)
CO2: 30 mmol/L (ref 22–32)
Calcium: 8.7 mg/dL — ABNORMAL LOW (ref 8.9–10.3)
Chloride: 91 mmol/L — ABNORMAL LOW (ref 98–111)
Creatinine, Ser: 2.78 mg/dL — ABNORMAL HIGH (ref 0.61–1.24)
GFR, Estimated: 22 mL/min — ABNORMAL LOW (ref >60)
Glucose, Bld: 245 mg/dL — ABNORMAL HIGH (ref 70–99)
Potassium: 4 mmol/L (ref 3.5–5.1)
Sodium: 130 mmol/L — ABNORMAL LOW (ref 135–145)

## 2020-07-15 LAB — PREPARE RBC (CROSSMATCH)

## 2020-07-15 LAB — FOLATE: Folate: 11.8 ng/mL (ref >5.9)

## 2020-07-15 LAB — CBC
HCT: 21.4 % — ABNORMAL LOW (ref 39.0–52.0)
Hemoglobin: 6.5 g/dL — ABNORMAL LOW (ref 13.0–17.0)
MCH: 28 pg (ref 26.0–34.0)
MCHC: 30.4 g/dL (ref 30.0–36.0)
MCV: 92.2 fL (ref 80.0–100.0)
Platelets: 173 10*3/uL (ref 150–400)
RBC: 2.32 MIL/uL — ABNORMAL LOW (ref 4.22–5.81)
RDW: 17.4 % — ABNORMAL HIGH (ref 11.5–15.5)
WBC: 8.3 10*3/uL (ref 4.0–10.5)
nRBC: 0 % (ref 0.0–0.2)

## 2020-07-15 LAB — IRON AND TIBC
Iron: 19 ug/dL — ABNORMAL LOW (ref 45–182)
Saturation Ratios: 4 % — ABNORMAL LOW (ref 17.9–39.5)
TIBC: 479 ug/dL — ABNORMAL HIGH (ref 250–450)
UIBC: 460 ug/dL

## 2020-07-15 LAB — FERRITIN: Ferritin: 15 ng/mL — ABNORMAL LOW (ref 24–336)

## 2020-07-15 LAB — CK: Total CK: 133 U/L (ref 49–397)

## 2020-07-15 LAB — CBG MONITORING, ED: Glucose-Capillary: 200 mg/dL — ABNORMAL HIGH (ref 70–99)

## 2020-07-15 LAB — MAGNESIUM: Magnesium: 2.4 mg/dL (ref 1.7–2.4)

## 2020-07-15 LAB — PROTIME-INR
INR: 1.6 — ABNORMAL HIGH (ref 0.8–1.2)
Prothrombin Time: 19.2 seconds — ABNORMAL HIGH (ref 11.4–15.2)

## 2020-07-15 LAB — OSMOLALITY: Osmolality: 304 mOsm/kg — ABNORMAL HIGH (ref 275–295)

## 2020-07-15 LAB — TSH: TSH: 7.182 u[IU]/mL — ABNORMAL HIGH (ref 0.350–4.500)

## 2020-07-15 MED ORDER — SODIUM CHLORIDE 0.9 % IV SOLN: 10.0000 mL/h | Freq: Once | INTRAVENOUS | Status: AC

## 2020-07-15 MED ORDER — METOPROLOL TARTRATE 50 MG PO TABS
50.0000 mg | ORAL_TABLET | Freq: Two times a day (BID) | ORAL | Status: DC
  Administered 2020-07-16 – 2020-07-17 (×3): 50 mg via ORAL
  Filled 2020-07-15 (×3): qty 1

## 2020-07-15 MED ORDER — SODIUM CHLORIDE 0.9 % IV BOLUS
1000.0000 mL | Freq: Once | INTRAVENOUS | Status: AC
  Administered 2020-07-15: 1000 mL via INTRAVENOUS

## 2020-07-15 MED ORDER — INSULIN GLARGINE 100 UNIT/ML ~~LOC~~ SOLN
8.0000 [IU] | Freq: Every day | SUBCUTANEOUS | Status: DC
  Administered 2020-07-15 – 2020-07-16 (×2): 8 [IU] via SUBCUTANEOUS
  Filled 2020-07-15 (×3): qty 0.08

## 2020-07-15 MED ORDER — ACETAMINOPHEN 650 MG RE SUPP: 650.0000 mg | Freq: Four times a day (QID) | RECTAL | Status: DC | PRN

## 2020-07-15 MED ORDER — LEVOTHYROXINE SODIUM 88 MCG PO TABS
88.0000 ug | ORAL_TABLET | Freq: Every day | ORAL | Status: DC
  Administered 2020-07-16 – 2020-07-17 (×2): 88 ug via ORAL
  Filled 2020-07-15 (×2): qty 1

## 2020-07-15 MED ORDER — TIOTROPIUM BROMIDE MONOHYDRATE 18 MCG IN CAPS
18.0000 ug | ORAL_CAPSULE | Freq: Every day | RESPIRATORY_TRACT | Status: DC
  Administered 2020-07-16 – 2020-07-17 (×2): 18 ug via RESPIRATORY_TRACT
  Filled 2020-07-15: qty 5

## 2020-07-15 MED ORDER — LATANOPROST 0.005 % OP SOLN
1.0000 [drp] | Freq: Every day | OPHTHALMIC | Status: DC
  Administered 2020-07-16 (×2): 1 [drp] via OPHTHALMIC
  Filled 2020-07-15: qty 2.5

## 2020-07-15 MED ORDER — PANTOPRAZOLE SODIUM 40 MG PO TBEC
40.0000 mg | DELAYED_RELEASE_TABLET | Freq: Every day | ORAL | Status: DC
  Administered 2020-07-16 – 2020-07-17 (×2): 40 mg via ORAL
  Filled 2020-07-15 (×2): qty 1

## 2020-07-15 MED ORDER — FLUTICASONE FUROATE-VILANTEROL 200-25 MCG/INH IN AEPB
1.0000 | INHALATION_SPRAY | Freq: Every day | RESPIRATORY_TRACT | Status: DC
  Administered 2020-07-16 – 2020-07-17 (×2): 1 via RESPIRATORY_TRACT
  Filled 2020-07-15: qty 28

## 2020-07-15 MED ORDER — INSULIN ASPART 100 UNIT/ML IJ SOLN
0.0000 [IU] | Freq: Three times a day (TID) | INTRAMUSCULAR | Status: DC
Start: 2020-07-16 — End: 2020-07-17
  Administered 2020-07-16: 2 [IU] via SUBCUTANEOUS
  Administered 2020-07-16: 1 [IU] via SUBCUTANEOUS
  Administered 2020-07-17: 3 [IU] via SUBCUTANEOUS
  Filled 2020-07-15 (×3): qty 1

## 2020-07-15 MED ORDER — APIXABAN 2.5 MG PO TABS
2.5000 mg | ORAL_TABLET | Freq: Two times a day (BID) | ORAL | Status: DC
  Administered 2020-07-16: 2.5 mg via ORAL
  Filled 2020-07-15: qty 1

## 2020-07-15 MED ORDER — TAMSULOSIN HCL 0.4 MG PO CAPS
0.4000 mg | ORAL_CAPSULE | Freq: Every day | ORAL | Status: DC
  Administered 2020-07-16 – 2020-07-17 (×2): 0.4 mg via ORAL
  Filled 2020-07-15 (×2): qty 1

## 2020-07-15 MED ORDER — ACETAMINOPHEN 325 MG PO TABS
650.0000 mg | ORAL_TABLET | Freq: Four times a day (QID) | ORAL | Status: DC | PRN
  Administered 2020-07-16: 650 mg via ORAL
  Filled 2020-07-15: qty 2

## 2020-07-15 NOTE — ED Triage Notes (Signed)
Pt comes into the ED via POV c/o urinary retention.  PT has only ouput 239ml in 20 days.  Pt has a h/o urinary retention and need for catheter.  Pt does have h/o dementia.  Denies any current pain and in NAD.

## 2020-07-15 NOTE — H&P (Signed)
History and Physical    PLEASE NOTE THAT [employer]AGON DICT[employer] SOFTWARE WAS USED IN THE CONSTRUCTION OF THIS NOTE.   Derek Shelton NWG:[id number] DOB: July 24, 1940 DOA: 07/15/2020  PCP: Gladstone Lighter, MD Patient coming from: home   I have personally briefly reviewed patient's old medical records in [place]  Chief Complaint: Diminished urine output  HPI: Derek Shelton is a 80 y.o. 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   Of note, patient speaks [name], requiring interpreter.   The following history is provided by the patient as well as by his son, Derek Shelton, in addition to my discussions with the emergency department physician and via chart review.  The patient presents with complaint of diminished urine output over the last 48 hours, over which time he reports less than 200 cc of urine output, representing a significant decline from his baseline urine output.  Denies any associated recent worsening of edema in the bilateral lower extremities.  He also denies any associated shortness of breath, orthopnea, or PND.  Not associate with any recent chest pain, palpitations, diaphoresis, dizziness, presyncope, or syncope.  Patient feels that his oral intake may have been slightly lower relative to baseline over the last several days, but denies any overt nausea, vomiting, or diarrhea.  He also denies any recent abdominal pain, melena, or hematochezia.  He also denies any recent gross hematuria.  Denies any recent dysuria.    The patient acknowledges a history of prior GI bleed in June 2021, at which time he underwent EGD and colonoscopy, but does not recall the results of these endoscopic studies.  He denies any subsequent gastrointestinal bleed, and specifically denies any recent melena or hematochezia.  In the setting of paroxysmal atrial fibrillation, he confirms that he is chronically anticoagulated on Eliquis.  Otherwise, denies any additional blood thinners as an outpatient, including no aspirin.  Denies any known history of chronic liver disease, and denies any history of alcohol abuse, or recent consumption of alcohol.  Per chart review, BUN range since 01/16/2020 has been 50-77. no recent trauma or travel.  Per chart review, the patient has a history of CKD 3B with baseline creatinine of 1.4-1.8, most recent prior serum creatinine data point noted to be 1.84 on 02/06/2020.  He also has a history of chronic anemia with baseline hemoglobin of 8-10, with most recent prior hemoglobin of 8.2 in February 2022.  He is on daily oral iron supplementation as an outpatient.  Additionally, per chart review, the patient has a history of chronic diastolic heart failure, with most recent echocardiogram performed in January 2019 showing a normal left ventricular cavity size, LVEF 65%, no evidence of focal wall motion abnormalities, and no evidence of significant valvular pathology, while the study was unable to evaluate diastolic function.  Outpatient diuretic regimen includes metolazone on Wednesday and Sunday.  He also has a history of hypothyroidism as well as BPH, for which he is on Flomax.  Denies any recent headache, neck stiffness, rhinitis, rhinorrhea, sore throat, sob, wheezing, cough, or rash.  No recent known COVID-19 exposures.     ED Course:  Vital signs in the ED were notable for the following: Temperature max 98.4, heart rate 59-63; blood pressure 113/64 -137/74; respiratory rate 17-18, oxygen saturation 98 to  100% on room air.  Labs were notable for the following: BMP was notable for the following: Sodium 130, which corrects approximately to 132 when taking into account concomitant hyperglycemia, and this is relative to most recent prior serum sodium level of 135 in January 2022, potassium 4.0, chloride 91, bicarbonate 30, BUN 62, creatinine 2.78, BUN to creatinine ratio 22.3, glucose 245.  CBC notable for white blood cell count of 8300, hemoglobin 6.5 associated with normocytic/normochromic findings as well as RDW 17.4, platelets 173.  Urinalysis with microscopy has been ordered, with result currently pending.  Screening nasopharyngeal COVID-19/influenza PCR were ordered in the ED this evening, with result currently pending.  While in the ED, the following were administered: Patient was typed and screened, with transfusion of 1 unit PRBC initiated.  He also received a 1 L normal saline bolus.  Subsequently, the patient was admitted to the mid telemetry floor for further evaluation and management of presenting AKI on CKD3b as well as acute on chronic anemia.     Review of Systems: As per HPI otherwise 10 point review of systems negative.   Past Medical History:  Diagnosis Date   Anemia    Asthma    CHF (congestive heart failure) (HCC)    COPD (chronic obstructive pulmonary disease) ([place])    Coronary artery disease    Dementia (HCC)    Per son's report   Diabetes mellitus without complication ([place])    Dysrhythmia    Atrial Fibrillation   GERD (gastroesophageal reflux disease)    GI bleed    Hyperlipidemia    Hypertension    Hypothyroidism    Shortness of breath dyspnea    Sleep apnea    Stroke [facility])    Thyroid disease     Past Surgical History:  Procedure Laterality Date   CARDIAC CATHETERIZATION     COLONOSCOPY N/A 08/10/2014   Procedure: COLONOSCOPY;  Surgeon: Manya Silvas, MD;  Location: [facility] ENDOSCOPY;  Service: Endoscopy;  Laterality: N/A;   COLONOSCOPY WITH PROPOFOL N/A  04/05/2016   Procedure: COLONOSCOPY WITH PROPOFOL;  Surgeon: Lollie Sails, MD;  Location[facility] ENDOSCOPY;  Service: Endoscopy;  Laterality: N/A;   COLONOSCOPY WITH PROPOFOL N/A 07/10/2019   Procedure: COLONOSCOPY WITH PROPOFOL;  Surgeon: Lucilla Lame, MD;  Location: [name] ENDOSCOPY;  Service: Endoscopy;  Laterality: N/A;   ESOPHAGOGASTRODUODENOSCOPY N/A 08/08/2014   Procedure: ESOPHAGOGASTRODUODENOSCOPY (EGD);  Surgeon: Manya Silvas, MD;  Location: [facility] ENDOSCOPY;  Service: Endoscopy;  Laterality: N/A;  plan for early afternoon   ESOPHAGOGASTRODUODENOSCOPY (EGD) WITH PROPOFOL N/A 07/10/2019   Procedure: ESOPHAGOGASTRODUODENOSCOPY (EGD) WITH PROPOFOL;  Surgeon: Lucilla Lame, MD;  Location: [facility] ENDOSCOPY;  Service: Endoscopy;  Laterality: N/A;   EYE SURGERY     GIVENS CAPSULE STUDY N/A 08/12/2014   Procedure: GIVENS CAPSULE STUDY;  Surgeon: Manya Silvas, MD;  Location: [facility] ENDOSCOPY;  Service: Endoscopy;  Laterality: N/A;   rectal fistula N/A     Social History:  reports that he has quit smoking. He has quit using smokeless tobacco.  His smokeless tobacco use included chew. He reports that he does not drink alcohol and does not use drugs.   Allergies  Allergen Reactions   Carvedilol Shortness Of Breath    Other reaction(s): Asthma, Shortness of Breath   Penicillins Shortness Of Breath    Other reaction(s): Other (  See Comments) Other reaction(s): RASH Other reaction(s): RASH Other reaction(s): RASH    Aspirin     Other reaction(s): Other (See Comments) On eliquis already- high risk of bleeding   Aricept [Donepezil Hcl] Other (See Comments)    Medication cause significant bradycardia   Catapres [Clonidine Hcl]     Family History  Problem Relation Age of Onset   Diabetes Mother     Family history reviewed and not pertinent    Prior to Admission medications   Medication Sig Start Date End Date Taking? Authorizing Provider  ACCU-CHEK GUIDE test strip E11.9   TO CHECK  BLOOD GLUCOSE TWICE A DAY 08/06/19  Yes [provider]  atorvastatin (LIPITOR) 40 MG tablet Take 1 tablet (40 mg total) by mouth daily at 6 PM. 12/03/17  Yes Demetrios Loll, MD  BD PEN NEEDLE NANO 2ND GEN 32G X 4 MM MISC USE NIGHTLY WITH TRESIBA 08/28/19  Yes [provider]  budesonide (PULMICORT) 0.5 MG/2ML nebulizer solution Inhale 0.5 mg into the lungs 2 (two) times daily. For 10 days 12/11/19  Yes [provider]  budesonide-formoterol (SYMBICORT) 160-4.5 MCG/ACT inhaler Inhale 2 puffs into the lungs 2 (two) times daily.   Yes [provider]  diltiazem (CARDIZEM CD) 180 MG 24 hr capsule Take 1 capsule (180 mg total) by mouth daily. 01/21/20 01/20/21 Yes Jennye Boroughs, MD  DULoxetine (CYMBALTA) 60 MG capsule Take 60 mg by mouth daily.   Yes [provider]  ELIQUIS 5 MG TABS tablet Take 5 mg by mouth 2 (two) times daily. 12/09/19  Yes [provider]  ergocalciferol (VITAMIN D2) 1.25 MG (50000 UT) capsule Take 50,000 Units by mouth once a week.   Yes [provider]  ferrous sulfate 325 (65 FE) MG tablet Take 325 mg by mouth daily. 12/07/19  Yes [provider]  formoterol (PERFOROMIST) 20 MCG/2ML nebulizer solution Inhale 2 mLs into the lungs 2 (two) times daily. 12/16/19 12/15/20 Yes [provider]  insulin degludec (TRESIBA) 100 UNIT/ML FlexTouch Pen Inject 30 Units into the skin at bedtime.    Yes [provider]  ipratropium-albuterol (DUONEB) 0.5-2.5 (3) MG/3ML SOLN Inhale 3 mLs into the lungs every 6 (six) hours as needed for shortness of breath. 01/22/17  Yes [provider]  latanoprost (XALATAN) 0.005 % ophthalmic solution latanoprost 0.005 % eye drops  INSTILL ONE DROP TO BOTH EYES AT BEDTIME.   Yes [provider]  levothyroxine (SYNTHROID) 88 MCG tablet Take 88 mcg by mouth every morning. 07/08/20  Yes [provider]  loratadine (CLARITIN) 10 MG tablet Take 10 mg by mouth  daily. 12/17/19  Yes [provider]  Melatonin 3 MG TABS Take 3 mg by mouth at bedtime as needed (sleep).    Yes [provider]  metoprolol tartrate (LOPRESSOR) 50 MG tablet Take 1 tablet (50 mg total) by mouth 2 (two) times daily. 01/21/20  Yes Jennye Boroughs, MD  NEURONTIN 100 MG capsule Take 100 mg by mouth 3 (three) times daily. 07/09/20  Yes [provider]  omega-3 acid ethyl esters (LOVAZA) 1 G capsule Take 1 g by mouth daily.    Yes [provider]  pantoprazole (PROTONIX) 40 MG tablet Take 40 mg by mouth daily. 06/04/19  Yes [provider]  Potassium Chloride ER 20 MEQ TBCR Take 1 tablet by mouth daily. 06/11/20  Yes [provider]  sildenafil (REVATIO) 20 MG tablet Take 20 mg by mouth 3 (three) times daily. 05/09/19  Yes [provider]  tamsulosin (FLOMAX) 0.4 MG CAPS capsule Take 0.4 mg by mouth daily. 05/07/19  Yes [provider]  tiotropium (SPIRIVA) 18 MCG inhalation capsule Place 18 mcg into inhaler and inhale daily.   Yes [provider]  torsemide (DEMADEX) 20 MG tablet Take 1 tablet (20 mg total) by mouth daily as needed (for swelling). 01/21/20  Yes Jennye Boroughs, MD  donepezil (ARICEPT) 5 MG tablet Take 5 mg by mouth at bedtime. Patient not taking: Reported on 03/18/2020    [provider]  metolazone (ZAROXOLYN) 2.5 MG tablet Take 1 tablet (2.5 mg total) by mouth daily for 5 days. 02/07/20 02/12/20  Naaman Plummer, MD     Objective    Physical Exam: Vitals:   07/15/20 1841 07/15/20 2030  BP: 137/74 113/64  Pulse: 63 (!) 59  Resp: 17 18  Temp: 98.4 F (36.9 C)   TempSrc: Oral   SpO2: 98% 100%  Weight: 86.2 kg   Height: 5\' 7"  (1.702 m)     General: appears to be stated age; alert, oriented Skin: warm, dry, no rash Head:  AT/[place] Mouth:  Oral mucosa membranes appear dry, normal dentition Neck: supple; trachea midline Heart:  RRR; did not appreciate any M/R/G Lungs: CTAB,  did not appreciate any wheezes, rales, or rhonchi Abdomen: + BS; soft, ND, NT Vascular: 2+ pedal pulses b/l; 2+ radial pulses b/l Extremities: no peripheral edema, no muscle wasting Neuro: strength and sensation intact in upper and lower extremities b/l    Labs on Admission: I have personally reviewed following labs and imaging studies  CBC: Recent Labs  Lab 07/15/20 1846  WBC 8.3  HGB 6.5*  HCT 21.4*  MCV 92.2  PLT 944   Basic Metabolic Panel: Recent Labs  Lab 07/15/20 1846  NA 130*  K 4.0  CL 91*  CO2 30  GLUCOSE 245*  BUN 62*  CREATININE 2.78*  CALCIUM 8.7*   GFR: Estimated Creatinine Clearance: 21.8 mL/min (A) (by C-G formula based on SCr of 2.78 mg/dL (H)). Liver Function Tests: No results for input(s): AST, ALT, ALKPHOS, BILITOT, PROT, ALBUMIN in the last 168 hours. No results for input(s): LIPASE, AMYLASE in the last 168 hours. No results for input(s): AMMONIA in the last 168 hours. Coagulation Profile: No results for input(s): INR, PROTIME in the last 168 hours. Cardiac Enzymes: No results for input(s): CKTOTAL, CKMB, CKMBINDEX, TROPONINI in the last 168 hours. BNP (last 3 results) No results for input(s): PROBNP in the last 8760 hours. HbA1C: No results for input(s): HGBA1C in the last 72 hours. CBG: No results for input(s): GLUCAP in the last 168 hours. Lipid Profile: No results for input(s): CHOL, HDL, LDLCALC, TRIG, CHOLHDL, LDLDIRECT in the last 72 hours. Thyroid Function Tests: No results for input(s): TSH, T4TOTAL, FREET4, T3FREE, THYROIDAB in the last 72 hours. Anemia Panel: No results for input(s): VITAMINB12, FOLATE, FERRITIN, TIBC, IRON, RETICCTPCT in the last 72 hours. Urine analysis:    Component Value Date/Time   COLORURINE YELLOW (A) 04/06/2019 1954   APPEARANCEUR CLEAR (A) 04/06/2019 1954   APPEARANCEUR Clear 07/17/2018 1601   LABSPEC 1.012 04/06/2019 1954   LABSPEC 1.004 05/06/2014 2100   PHURINE 5.0 04/06/2019 [place]  NEGATIVE 04/06/2019 1954   GLUCOSEU Negative 05/06/2014 2100   HGBUR NEGATIVE 04/06/2019 [place] NEGATIVE 04/06/2019 1954   BILIRUBINUR Negative 07/17/2018 1601   BILIRUBINUR Negative 05/06/2014 2100   [place] 04/06/2019 1954   PROTEINUR NEGATIVE 04/06/2019  [place] 04/06/2019 1954   LEUKOCYTESUR NEGATIVE 04/06/2019 1954   LEUKOCYTESUR Negative 05/06/2014 2100    Radiological Exams on Admission: No results found.    Assessment/Plan   Derek Shelton is a 80 y.o. male with medical history significant for CKD 3B with baseline creatinine 3.7-9.0, chronic diastolic heart failure, chronic anemia with baseline hemoglobin 8-10, type 2 diabetes mellitus, COPD, paroxysmal atrial fibrillation chronically anticoagulated on Eliquis, GERD, acquired hypothyroidism, BPH, who is admitted to [facility] on 07/15/2020 with acute kidney injury superimposed on stage IIIb chronic kidney disease after presenting from home to [facility] ED complaining of diminished urine output.    Principal Problem:   AKI (acute kidney injury) ([place]) Active Problems:   Atrial fibrillation (HCC)   Chronic diastolic heart failure (HCC)   Type 2 diabetes mellitus without complication (HCC)   Esophageal reflux   Essential hypertension   BPH (benign prostatic hyperplasia)   Acute on chronic anemia   Acute hyponatremia     #) Acute kidney injury superimposed on stage IIIb chronic kidney disease: In the context of a documented history of stage IIIb chronic kidney disease, with baseline creatinine range of 1.4-1.8, most recent prior serum creatinine data point noted to be 1.84 in January 2020, presenting serum creatinine noted to be 2.78, in the context of patient's presenting diminished urine output over the last 48 hours.  Etiology not entirely clear at this time, particularly in the setting of concomitant presenting acute on chronic anemia, as further detailed below.   Specifically, it is unclear at this time with the patient's AKI is as a consequence of primary acute on chronic anemia resulting in diminished oxygen delivery to the kidneys or if patient's acute on chronic anemia is as a consequence of patient's acute kidney injury as a primary pathology.  Patient appears mildly dehydrated at this time, including presence of dry oral mucosa membranes, with BUN to creatinine ratio suggestive of prerenal azotemia, which be as a prerenal consequence of diminished oxygen delivery capacity in the setting of acute on chronic anemia, as above.  However, in the setting of patient's poor recent decline in oral intake while continuing to take metolazone, suspect an element of primary intravascular depletion in the setting.  He is currently receiving transfusion 1 unit PBC, which was preceded by administration of 1 L of normal saline. Will closely monitor ensuing trend in renal function in response to these intravascular repletion measures, while further evaluating presenting acute on chronic anemia, as further detailed below.  Of note, no evidence to suggest need for urgent overnight hemodialysis, including no evidence of hyperkalemia: Acute volume overload, uremia, or anion gap metabolic acidosis.   Plan: Continue transfusion of 1 unit PRBC.  Monitor strict I's and O's and daily weights.  Tempt avoid nephrotoxic agents.  Included in this, will hold home gabapentin for now given renal clearance associate with this medication.  Follow-up result of urinalysis with microscopy.  Add on random urine sodium as well as random urine creatinine.  Check CPK.  Repeat BMP in the morning.  Check serum magnesium and phosphorus levels.  Renal ultrasound for parenchymal evaluation as well as evaluating for postrenal obstructive contributions, has been ordered for the morning.  Hold home metolazone for now.  Further evaluation management of presenting acute on chronic anemia, as further detailed  below.       #) Acute on chronic anemia: In the setting of a history of chronic anemia globin 8-10, with  most recent prior hemoglobin noted to be 8.2 in February 2020, presenting hemoglobin noted to be 6.5 associated with normocytic/normochromic findings as well as mildly elevated RDW.  As stated above, source of presenting anemia is unclear, as this may be as a consequence of acute kidney injury or the driving factor leading to acute exacerbation of patient's chronic kidney disease.  No evidence of overt bleeding at this time, and the patient denies any recent melena or hematochezia.  We will check fecal occult blood to further evaluate, while adding on the following laboratory studies to pretransfusion labs.  Patient receiving transfusion of 1 unit PRBC in the ED this evening.  Of note, he appears hemodynamically stable at this time, and otherwise asymptomatic aside from his presenting diminished urine output over the last 2 days.  In terms of etiology contributing to patient's chronic anemia, suspect contribution from anemia of chronic kidney disease in the setting of a history of stage IIIb CKD, although there may also be a contribution from chronic iron deficiency given that the patient is also on daily oral iron supplementation as an outpatient.  Of note, in the setting of paroxysmal atrial fibrillation, the patient is chronically anticoagulated on Eliquis, but otherwise on no blood thinners at home.  In the setting of the patient's age as well as renal function, it appears that a dose adjustment is warranted relative to his home Eliquis of 5 mg p.o. twice daily, as further quantified below.  Plan: Continue transfusion of 1 unit PBC.  Repeat H&H has been ordered for 1 AM on 07/16/2020.  Add on the following labs to pretransfusion specimen: Iron studies, MMA, folic acid level, reticulocyte count, and peripheral smear.  Check INR.  Monitor on telemetry and monitor continuous pulse oximetry.  Check fecal  occult blood.  Decrease home Eliquis to 2.5 mg p.o. twice daily, as further detailed above.  Repeat CBC in the morning.  Further evaluation management of AKI on CKD 3B, as above.       #) Acute hypoosmolar hyponatremia: Presenting serum sodium noted to correct to 132 when taking into account hyperglycemia, which is relative to most recent prior value 135 in January 2022.  In the setting of clinical evidence of intravascular depletion and concomitant hypochloremia, suspect contribution from increased renal losses as a consequence of metolazone.  Of note, patient also has a history of acquired hypothyroidism on Synthroid as an outpatient.  No evidence of overt volume overload at this time, although will closely monitor for ensuing development of evidence of such given a documented history of chronic diastolic heart failure, particularly given plan for completion of transfusion of 1 unit PRBC, as above.  Plan: Monitor strict I's and O's and daily weights.  Follow-up result of urinalysis.  Add on random urine sodium as well as urine osmolality.  Check TSH.  Check serum osmolality to confirm suspected hypoosmolar etiology.  Repeat CMP in the morning.  Hold home metolazone for now.  Check chest x-ray.  Follow for results of COVID-19 screen initiated in the ED this evening.  Check INR to evaluate hepatic synthetic function.        #) Type 2 diabetes mellitus: On Tresiba 30 units subcu nightly as an outpatient in the absence of any short acting insulin.  Presenting blood sugar noted to be 245, without evidence of dka. Will conservatively resume a portion of home basal insulin at this time in order to reduce risk for development of hypoglycemia.  Plan: Levemir 8 units subcu  nightly, with first dose now.  Accu-Cheks before every meal and at bedtime with low-dose high scale insulin.      #) Chronic diastolic heart failure: Documented history of such, with most recent echocardiogram occurring in January  2019, with results as further detailed above.  Outpatient diuretic regimen includes metolazone 2.5 mg p.o. on Wednesday and Sunday.  No clinical evidence to suggest acute volume overload at this time.  Rather, there is suspicion for intravascular relation in the setting of mild dehydration, as further detailed above.  Close monitoring for ensuing evidence of acute volume overload given plan for transfusion of 1 unit PRBC, as above.  Plan: Monitor strict I's and O's and daily weights.  Hold metolazone for now.  Add on serum magnesium level.  Repeat CMP in the morning.  Monitor on continuous pulse oximetry.  Monitor on telemetry.       #) COPD: Documented history of such, in the setting of patient being a former smoker: Outpatient respiratory regimen includes Symbicort as well as Spiriva.  No evidence to suggest acute COPD exacerbation at this time.  Plan: Continue with Symbicort and Spiriva.        #) Paroxysmal atrial fibrillation: Documented history of such. In the setting of a CHA2DS2-VASc score of 8, there is an indication for the patient to be on chronic anticoagulation for thromboembolic prophylaxis. Consistent with this, the patient is chronically anticoagulated on Eliquis, although a dose reduction from current 5 mg p.o. twice daily appears to be warranted in the setting of the patient's age and current renal function, as further detailed above.. Home AV nodal blocking regimen: Lopressor and diltiazem.  Most recent echocardiogram occurred in January 2019, as further detailed above. Appears to be in normal sinus rhythm at this time, although he appears borderline bradycardic with heart rates in the high 50s to low 60s.  In order to not further blood potential compensatory tachycardia in the setting of acute on chronic anemia as a means of increasing oxygen delivery capacity, particularly in the setting of current borderline bradycardia, will hold home diltiazem for now, with plan for  resumption of metoprolol tartrate in the morning, with close interval monitoring of patient's heart rate as well as blood pressure, particularly in response to transfusion of 1 unit PRBC.   Plan: monitor strict I's & O's and daily weights. Repeat BMP and CBC in the morning. Check serum magnesium level.  Resume home metoprolol titrate in the morning, holding home diltiazem for now, as further detailed above.  Continue Eliquis, but at reduced dose of 2.5 mg p.o. twice daily, as above.  Monitor on telemetry.      #) Acquired hypothyroidism: On Synthroid as an outpatient.  Particular in the setting of acute hyponatremia, will check TSH.  Plan: Check TSH, as above.  For now, continue current home dose of Synthroid.        #) Benign prostatic hyperplasia: Documented history of such, with the patient on Flomax as an outpatient.  Notable component of patient's history , particular in setting of acute kidney injury superimposed on stage IIIb chronic kidney disease.   Plan: Further evaluation management of presenting AKI on CKD 3B, including plan for renal ultrasound to occur in the morning.  Continue home Flomax.  Monitor strict I's and O's and daily weights.  Repeat CMP in the morning.       DVT prophylaxis: scd's plus Eliquis 2.5 mg PO BID (representing a reduction from home dose of 5 mg p.o. twice  daily, as further detailed above) Code Status: Full code Family Communication: case discussed with patient's son, Derek Shelton.  Disposition Plan: Per Rounding Team Consults called: none  Admission status: inpatient; med-tele.      Of note, this patient was added by me to the following Admit List/Treatment Team: [facility].      PLEASE NOTE THAT DRAGON DICTATION SOFTWARE WAS USED IN THE CONSTRUCTION OF THIS NOTE.   [facility] Pager [phone] From [place]  Otherwise, please contact night-coverage  [email or web address] Password [facility]   07/15/2020, 9:26 PM

## 2020-07-15 NOTE — Progress Notes (Signed)
Brief note regarding plan, with full H&P to follow:  80 year old male with history of stage IIIb chronic kidney disease with baseline creatinine 1.4-1.8 as well as chronic anemia with baseline hemoglobin of 8-10, who is admitted with acute kidney injury superimposed on stage IIIb CKD as well as acute on chronic anemia after presenting with complaint of diminished urine output over the last 48 hours. ED lab eval notable for creatinine of 2.78 as well as hemoglobin of 6.5.  Patient denies any recent melena or hematochezia.  Appears hemodynamically stable.  Is receiving transfusion of 1 unit PRBC. Will add on additional anemia labs, including iron studies, MMA, folic acid level, reticulocyte count, peripheral smear.  Additionally, we will check urinalysis with microscopy and monitor interval renal function trend in response to PRBC transfusion, as it is currently unclear if AKI is a consequence of diminished oxygen delivery capacity as a result of primary acute on chronic anemia versus presenting acute exacerbation of underlying anemia as a consequence of worsening renal function.     Babs Bertin, DO Hospitalist

## 2020-07-15 NOTE — ED Notes (Signed)
Reviewed pt's chart & results, acuity level changed

## 2020-07-15 NOTE — ED Provider Notes (Signed)
[facility] Emergency Department Provider Note   ____________________________________________   Event Date/Time   First MD Initiated Contact with Patient 07/15/20 2029     (approximate)  I have reviewed the triage vital signs and the nursing notes.   HISTORY  Chief Complaint Urinary Retention    HPI Derek Shelton is a 80 y.o. male with below stated past medical history who presents for decreased urination over the last 2 days.  Patient has a history of CKD but normally urinates fine.  Patient states that he just does not have the urge to urinate.  Patient has history of dementia and history obtained from son who is at bedside.  Denies patient having any vomiting/diarrhea, recent illness, complaints of abdominal pain, or worsening mental status         Past Medical History:  Diagnosis Date   Anemia    Asthma    CHF (congestive heart failure) (HCC)    COPD (chronic obstructive pulmonary disease) ([place])    Coronary artery disease    Dementia ([place])    Per son's report   Diabetes mellitus without complication ([place])    Dysrhythmia    Atrial Fibrillation   GERD (gastroesophageal reflux disease)    GI bleed    Hyperlipidemia    Hypertension    Hypothyroidism    Shortness of breath dyspnea    Sleep apnea    Stroke [facility])    Thyroid disease     Patient Active Problem List   Diagnosis Date Noted   Acute respiratory disease due to COVID-19 virus 01/14/2020   COPD (chronic obstructive pulmonary disease) (HCC)    Hypothyroidism    Acute respiratory failure with hypoxia ([place]) 08/06/2019   Acute on chronic diastolic CHF (congestive heart failure) ([place]) 08/06/2019   History of GI bleed 08/06/2019   History of bradycardia 08/06/2019   Moderate tricuspid regurgitation 08/06/2019   PAH (pulmonary artery hypertension) ([place]) 08/06/2019   History of CVA (cerebrovascular accident) 08/06/2019   LLL pneumonia 08/06/2019   HCAP (healthcare-associated  pneumonia) 08/06/2019   Acute CHF (congestive heart failure) ([place]) 08/06/2019   Dementia without behavioral disturbance ([place])    Normocytic anemia 07/06/2019   Hypokalemia 04/02/2019   Proteinuria 03/04/2019   Retention of urine 03/04/2019   Chronic kidney disease, stage 3a ([place]) 03/04/2019   Type II diabetes mellitus with renal manifestations ([place]) 03/04/2019   Chronic kidney disease, stage 2 (mild) 01/04/2019   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   BPH (benign prostatic hyperplasia) 07/18/2018   Degeneration of lumbar intervertebral disc 07/17/2018   Osteoarthritis of knee 07/17/2018   Spinal stenosis of lumbar region 07/17/2018   Acute CVA (cerebrovascular accident) ([place]) 12/02/2017   Acute ischemic stroke ([place]) 60/63/0160   Acute metabolic encephalopathy due to hypoglycemia 11/29/2017   Essential hypertension 11/29/2017   Syncope and collapse 11/28/2017   Atrial fibrillation, chronic ([place]) 05/22/2017   Chest pain 02/21/2017   AKI (acute kidney injury) ([place]) 02/21/2017   SOB (shortness of breath) 02/08/2017   Atrial fibrillation with RVR ([place]) 02/08/2017   Hyperlipidemia 09/23/2014   GI bleeding 08/06/2014   Anemia 08/06/2014   Bradycardia 08/06/2014   Atrial fibrillation ([place]) 08/06/2014   Hyponatremia 08/06/2014   Chronic diastolic heart failure ([place]) 08/06/2014   OSA (obstructive sleep apnea) 08/06/2014   Memory loss or impairment 05/29/2014   Current smoker 09/19/2013   Asthma 11/29/2012   Dizziness 11/15/2012   Other dyspnea and respiratory abnormality 11/15/2012   Pain  in joint involving lower leg 01/05/2011   Type 2 diabetes mellitus without complication ([place]) 69/62/9528   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   Chronic rhinitis 08/11/2010   Coronary artery disease 08/11/2010   Esophageal reflux 08/11/2010   Personal history of arthritis 08/11/2010   Tear film insufficiency 06/07/2010   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    Keratoconjunctivitis sicca ([place]) 03/10/2010   Lens replaced 03/10/2010   Pterygium 03/10/2010   Gastro-esophageal reflux disease with esophagitis 01/30/2006    Past Surgical History:  Procedure Laterality Date   CARDIAC CATHETERIZATION     COLONOSCOPY N/A 08/10/2014   Procedure: COLONOSCOPY;  Surgeon: Manya Silvas, MD;  Location: [place];  Service: Endoscopy;  Laterality: N/A;   COLONOSCOPY WITH PROPOFOL N/A 04/05/2016   Procedure: COLONOSCOPY WITH PROPOFOL;  Surgeon: Lollie Sails, MD;  Location: [facility] ENDOSCOPY;  Service: Endoscopy;  Laterality: N/A;   COLONOSCOPY WITH PROPOFOL N/A 07/10/2019   Procedure: COLONOSCOPY WITH PROPOFOL;  Surgeon: Lucilla Lame, MD;  Location: [facility] ENDOSCOPY;  Service: Endoscopy;  Laterality: N/A;   ESOPHAGOGASTRODUODENOSCOPY N/A 08/08/2014   Procedure: ESOPHAGOGASTRODUODENOSCOPY (EGD);  Surgeon: Manya Silvas, MD;  Location: [facility] ENDOSCOPY;  Service: Endoscopy;  Laterality: N/A;  plan for early afternoon   ESOPHAGOGASTRODUODENOSCOPY (EGD) WITH PROPOFOL N/A 07/10/2019   Procedure: ESOPHAGOGASTRODUODENOSCOPY (EGD) WITH PROPOFOL;  Surgeon: Lucilla Lame, MD;  Location: [facility] ENDOSCOPY;  Service: Endoscopy;  Laterality: N/A;   EYE SURGERY     GIVENS CAPSULE STUDY N/A 08/12/2014   Procedure: GIVENS CAPSULE STUDY;  Surgeon: Manya Silvas, MD;  Location: [facility] ENDOSCOPY;  Service: Endoscopy;  Laterality: N/A;   rectal fistula N/A     Prior to Admission medications   Medication Sig Start Date End Date Taking? Authorizing Provider  ACCU-CHEK GUIDE test strip E11.9   TO CHECK BLOOD GLUCOSE TWICE A DAY 08/06/19   [provider]  atorvastatin (LIPITOR) 40 MG tablet Take 1 tablet (40 mg total) by mouth daily at 6 PM. 12/03/17   Demetrios Loll, MD  BD PEN NEEDLE NANO 2ND GEN 32G X 4 MM MISC USE NIGHTLY WITH TRESIBA 08/28/19   [provider]  budesonide (PULMICORT) 0.5 MG/2ML nebulizer solution Inhale 0.5 mg into the lungs 2 (two) times daily. For  10 days 12/11/19   [provider]  budesonide-formoterol (SYMBICORT) 160-4.5 MCG/ACT inhaler Inhale 2 puffs into the lungs 2 (two) times daily.    [provider]  diltiazem (CARDIZEM CD) 180 MG 24 hr capsule Take 1 capsule (180 mg total) by mouth daily. 01/21/20 01/20/21  Jennye Boroughs, MD  donepezil (ARICEPT) 5 MG tablet Take 5 mg by mouth at bedtime. Patient not taking: Reported on 03/18/2020    [provider]  DULoxetine (CYMBALTA) 60 MG capsule Take 60 mg by mouth daily.    [provider]  ELIQUIS 5 MG TABS tablet Take 5 mg by mouth 2 (two) times daily. 12/09/19   [provider]  ergocalciferol (VITAMIN D2) 1.25 MG (50000 UT) capsule Take 50,000 Units by mouth once a week.    [provider]  ferrous sulfate 325 (65 FE) MG tablet Take 325 mg by mouth daily. 12/07/19   [provider]  formoterol (PERFOROMIST) 20 MCG/2ML nebulizer solution Inhale 2 mLs into the lungs 2 (two) times daily. 12/16/19 12/15/20  [provider]  insulin degludec (TRESIBA) 100 UNIT/ML FlexTouch Pen Inject 30 Units into the skin at bedtime.     [provider]  ipratropium-albuterol (DUONEB) 0.5-2.5 (3) MG/3ML SOLN  Inhale 3 mLs into the lungs every 6 (six) hours as needed for shortness of breath. 01/22/17   [provider]  latanoprost (XALATAN) 0.005 % ophthalmic solution latanoprost 0.005 % eye drops  INSTILL ONE DROP TO BOTH EYES AT BEDTIME.    [provider]  levothyroxine (SYNTHROID, LEVOTHROID) 112 MCG tablet Take 112 mcg by mouth daily before breakfast.     [provider]  loratadine (CLARITIN) 10 MG tablet Take 10 mg by mouth daily. 12/17/19   [provider]  Melatonin 3 MG TABS Take 3 mg by mouth at bedtime as needed (sleep).     [provider]  metolazone (ZAROXOLYN) 2.5 MG tablet Take 1 tablet (2.5 mg total) by mouth daily for 5 days. 02/07/20 02/12/20  Naaman Plummer, MD   metoprolol tartrate (LOPRESSOR) 50 MG tablet Take 1 tablet (50 mg total) by mouth 2 (two) times daily. 01/21/20   Jennye Boroughs, MD  omega-3 acid ethyl esters (LOVAZA) 1 G capsule Take 1 g by mouth daily.     [provider]  pantoprazole (PROTONIX) 40 MG tablet Take 40 mg by mouth daily. 06/04/19   [provider]  sildenafil (REVATIO) 20 MG tablet Take 20 mg by mouth 3 (three) times daily. 05/09/19   [provider]  tamsulosin (FLOMAX) 0.4 MG CAPS capsule Take 0.4 mg by mouth daily. 05/07/19   [provider]  tiotropium (SPIRIVA) 18 MCG inhalation capsule Place 18 mcg into inhaler and inhale daily.    [provider]  torsemide (DEMADEX) 20 MG tablet Take 1 tablet (20 mg total) by mouth daily as needed (for swelling). 01/21/20   Jennye Boroughs, MD    Allergies Carvedilol, Penicillins, Aspirin, Aricept [donepezil hcl], and Catapres [clonidine hcl]  Family History  Problem Relation Age of Onset   Diabetes Mother     Social History Social History   Tobacco Use   Smoking status: Former    Pack years: 0.00   Smokeless tobacco: Former    Types: Nurse, children's Use: Never used  Substance Use Topics   Alcohol use: No   Drug use: No    Review of Systems Unable to assess ____________________________________________   PHYSICAL EXAM:  VITAL SIGNS: ED Triage Vitals [07/15/20 1841]  Enc Vitals Group     BP 137/74     Pulse Rate 63     Resp 17     Temp 98.4 F (36.9 C)     Temp Source Oral     SpO2 98 %     Weight 190 lb (86.2 kg)     Height 5\' 7"  (1.702 m)     Head Circumference      Peak Flow      Pain Score 0     Pain Loc      Pain Edu?      Excl. in [place]?    Constitutional: Alert and disoriented. Well appearing and in no acute distress. Eyes: Conjunctivae are normal. PERRL. Head: Atraumatic. Nose: No congestion/rhinnorhea. Mouth/Throat: Mucous membranes are moist. Neck: No stridor Cardiovascular: Grossly  normal heart sounds.  Good peripheral circulation. Respiratory: Normal respiratory effort.  No retractions. Gastrointestinal: Soft and nontender. No distention. Musculoskeletal: No obvious deformities Neurologic:  No gross focal neurologic deficits are appreciated. Skin:  Skin is warm and dry. No rash noted. Psychiatric: Cooperative  ____________________________________________   LABS (all labs ordered are listed, but only abnormal results are displayed)  Labs Reviewed  BASIC  METABOLIC PANEL - Abnormal; Notable for the following components:      Result Value   Sodium 130 (*)    Chloride 91 (*)    Glucose, Bld 245 (*)    BUN 62 (*)    Creatinine, Ser 2.78 (*)    Calcium 8.7 (*)    GFR, Estimated 22 (*)    All other components within normal limits  CBC - Abnormal; Notable for the following components:   RBC 2.32 (*)    Hemoglobin 6.5 (*)    HCT 21.4 (*)    RDW 17.4 (*)    All other components within normal limits  RESP PANEL BY RT-PCR (FLU A&B, COVID) ARPGX2  URINALYSIS, COMPLETE (UACMP) WITH MICROSCOPIC  TYPE AND SCREEN  PREPARE RBC (CROSSMATCH)  TYPE AND SCREEN   PROCEDURES  Procedure(s) performed (including Critical Care):  .1-3 Lead EKG Interpretation  Date/Time: 07/15/2020 9:10 PM Performed by: Naaman Plummer, MD Authorized by: Naaman Plummer, MD     Interpretation: normal     ECG rate:  61   ECG rate assessment: normal     Rhythm: sinus rhythm     Ectopy: none     Conduction: normal     ____________________________________________   INITIAL IMPRESSION / ASSESSMENT AND PLAN / ED COURSE  As part of my medical decision making, I reviewed the following data within the [place] notes reviewed and incorporated, Labs reviewed, EKG interpreted, Old chart reviewed, Radiograph reviewed and Notes from prior ED visits reviewed and incorporated        This patient presents with decreased urination over the last 2 days. Suspect acute  on chronic renal failure. Doubt intrinsic renal dysfunction or obstructive nephropathy as patient's bladder scan was 47 cc. Considered alternate etiologies of the patients symptoms including infectious processes, severe metabolic derangements or electrolyte abnormalities, ACS, heart failure, and intracranial/central processes but think these are unlikely given the history and physical exam.  Ischemia is still in the differential as patient does have symptomatic anemia with hemoglobin of 6.5.  Plan: labs, 1L fluid resuscitation, PRBC transfusion, reassessment  Dispo: Admit to medicine  Patient's son, Mo, provides history for this patient and states that he can be reached at any time for translation for his father or mother. [phone]      ____________________________________________   FINAL CLINICAL IMPRESSION(S) / ED DIAGNOSES  Final diagnoses:  Acute renal failure superimposed on chronic kidney disease, unspecified CKD stage, unspecified acute renal failure type ([place])  Symptomatic anemia     ED Discharge Orders     None        Note:  This document was prepared using Dragon voice recognition software and may include unintentional dictation errors.    Naaman Plummer, MD 07/15/20 2110

## 2020-07-16 ENCOUNTER — Inpatient Hospital Stay: Payer: Medicare Other

## 2020-07-16 DIAGNOSIS — N179 Acute kidney failure, unspecified: Principal | ICD-10-CM

## 2020-07-16 LAB — URINALYSIS, COMPLETE (UACMP) WITH MICROSCOPIC
Bilirubin Urine: NEGATIVE
Glucose, UA: NEGATIVE mg/dL
Hgb urine dipstick: NEGATIVE
Ketones, ur: NEGATIVE mg/dL
Leukocytes,Ua: NEGATIVE
Nitrite: NEGATIVE
Protein, ur: NEGATIVE mg/dL
RBC / HPF: NONE SEEN RBC/hpf (ref 0–5)
Specific Gravity, Urine: 1.01 (ref 1.005–1.030)
pH: 6 (ref 5.0–8.0)

## 2020-07-16 LAB — COMPREHENSIVE METABOLIC PANEL
ALT: 14 U/L (ref 0–44)
AST: 25 U/L (ref 15–41)
Albumin: 3.2 g/dL — ABNORMAL LOW (ref 3.5–5.0)
Alkaline Phosphatase: 65 U/L (ref 38–126)
Anion gap: 12 (ref 5–15)
BUN: 64 mg/dL — ABNORMAL HIGH (ref 8–23)
CO2: 29 mmol/L (ref 22–32)
Calcium: 8.6 mg/dL — ABNORMAL LOW (ref 8.9–10.3)
Chloride: 92 mmol/L — ABNORMAL LOW (ref 98–111)
Creatinine, Ser: 2.57 mg/dL — ABNORMAL HIGH (ref 0.61–1.24)
GFR, Estimated: 24 mL/min — ABNORMAL LOW (ref >60)
Glucose, Bld: 100 mg/dL — ABNORMAL HIGH (ref 70–99)
Potassium: 3.6 mmol/L (ref 3.5–5.1)
Sodium: 133 mmol/L — ABNORMAL LOW (ref 135–145)
Total Bilirubin: 1.4 mg/dL — ABNORMAL HIGH (ref 0.3–1.2)
Total Protein: 7.1 g/dL (ref 6.5–8.1)

## 2020-07-16 LAB — CBC
HCT: 23.2 % — ABNORMAL LOW (ref 39.0–52.0)
Hemoglobin: 7.4 g/dL — ABNORMAL LOW (ref 13.0–17.0)
MCH: 28.8 pg (ref 26.0–34.0)
MCHC: 31.9 g/dL (ref 30.0–36.0)
MCV: 90.3 fL (ref 80.0–100.0)
Platelets: 174 10*3/uL (ref 150–400)
RBC: 2.57 MIL/uL — ABNORMAL LOW (ref 4.22–5.81)
RDW: 17.2 % — ABNORMAL HIGH (ref 11.5–15.5)
WBC: 7.6 10*3/uL (ref 4.0–10.5)
nRBC: 0 % (ref 0.0–0.2)

## 2020-07-16 LAB — GLUCOSE, CAPILLARY
Glucose-Capillary: 93 mg/dL (ref 70–99)
Glucose-Capillary: 149 mg/dL — ABNORMAL HIGH (ref 70–99)
Glucose-Capillary: 158 mg/dL — ABNORMAL HIGH (ref 70–99)
Glucose-Capillary: 114 mg/dL — ABNORMAL HIGH (ref 70–99)

## 2020-07-16 LAB — PATHOLOGIST SMEAR REVIEW

## 2020-07-16 LAB — PHOSPHORUS: Phosphorus: 5.2 mg/dL — ABNORMAL HIGH (ref 2.5–4.6)

## 2020-07-16 LAB — PROTIME-INR
INR: 1.5 — ABNORMAL HIGH (ref 0.8–1.2)
Prothrombin Time: 18.4 seconds — ABNORMAL HIGH (ref 11.4–15.2)

## 2020-07-16 LAB — BRAIN NATRIURETIC PEPTIDE: B Natriuretic Peptide: 210 pg/mL — ABNORMAL HIGH (ref 0.0–100.0)

## 2020-07-16 LAB — OSMOLALITY, URINE: Osmolality, Ur: 315 mOsm/kg (ref 300–900)

## 2020-07-16 LAB — CREATININE, URINE, RANDOM: Creatinine, Urine: 60 mg/dL

## 2020-07-16 LAB — MAGNESIUM: Magnesium: 2.3 mg/dL (ref 1.7–2.4)

## 2020-07-16 LAB — SODIUM, URINE, RANDOM: Sodium, Ur: 48 mmol/L

## 2020-07-16 MED ORDER — BUDESONIDE 0.5 MG/2ML IN SUSP
0.5000 mg | Freq: Two times a day (BID) | RESPIRATORY_TRACT | Status: DC
  Administered 2020-07-16 – 2020-07-17 (×2): 0.5 mg via RESPIRATORY_TRACT
  Filled 2020-07-16 (×2): qty 2

## 2020-07-16 MED ORDER — SODIUM CHLORIDE 0.9 % IV SOLN: INTRAVENOUS | Status: DC

## 2020-07-16 MED ORDER — PREDNISONE 20 MG PO TABS
40.0000 mg | ORAL_TABLET | Freq: Every day | ORAL | Status: DC
  Administered 2020-07-17: 40 mg via ORAL
  Filled 2020-07-16: qty 2

## 2020-07-16 MED ORDER — GABAPENTIN 100 MG PO CAPS
100.0000 mg | ORAL_CAPSULE | Freq: Three times a day (TID) | ORAL | Status: DC
  Administered 2020-07-16 – 2020-07-17 (×4): 100 mg via ORAL
  Filled 2020-07-16 (×4): qty 1

## 2020-07-16 MED ORDER — FUROSEMIDE 10 MG/ML IJ SOLN
80.0000 mg | Freq: Once | INTRAMUSCULAR | Status: AC
  Administered 2020-07-16: 80 mg via INTRAVENOUS
  Filled 2020-07-16: qty 8

## 2020-07-16 MED ORDER — NEPRO/CARBSTEADY PO LIQD
237.0000 mL | ORAL | Status: DC
  Administered 2020-07-16: 237 mL via ORAL

## 2020-07-16 MED ORDER — DILTIAZEM HCL ER COATED BEADS 180 MG PO CP24
180.0000 mg | ORAL_CAPSULE | Freq: Every day | ORAL | Status: DC
  Administered 2020-07-16 – 2020-07-17 (×2): 180 mg via ORAL
  Filled 2020-07-16 (×2): qty 1

## 2020-07-16 MED ORDER — IPRATROPIUM-ALBUTEROL 0.5-2.5 (3) MG/3ML IN SOLN
3.0000 mL | Freq: Once | RESPIRATORY_TRACT | Status: AC
  Administered 2020-07-16: 3 mL via RESPIRATORY_TRACT
  Filled 2020-07-16: qty 3

## 2020-07-16 MED ORDER — TORSEMIDE 20 MG PO TABS
20.0000 mg | ORAL_TABLET | Freq: Two times a day (BID) | ORAL | Status: DC
  Administered 2020-07-16 – 2020-07-17 (×2): 20 mg via ORAL
  Filled 2020-07-16 (×2): qty 1

## 2020-07-16 MED ORDER — POTASSIUM CHLORIDE CRYS ER 20 MEQ PO TBCR
20.0000 meq | EXTENDED_RELEASE_TABLET | Freq: Every day | ORAL | Status: DC
  Administered 2020-07-16 – 2020-07-17 (×2): 20 meq via ORAL
  Filled 2020-07-16 (×2): qty 1

## 2020-07-16 MED ORDER — IPRATROPIUM-ALBUTEROL 0.5-2.5 (3) MG/3ML IN SOLN
3.0000 mL | Freq: Four times a day (QID) | RESPIRATORY_TRACT | Status: DC
  Administered 2020-07-16 – 2020-07-17 (×4): 3 mL via RESPIRATORY_TRACT
  Filled 2020-07-16 (×3): qty 3

## 2020-07-16 NOTE — Progress Notes (Addendum)
PROGRESS NOTE    Derek Shelton  GYF:[id number] DOB: 02-27-1940 DOA: 07/15/2020 PCP: Gladstone Lighter, MD     Brief Narrative:   Patient presents with 48 hours decreased urine output. Found to have aki and acute on chronic anemia. Transfused 1 unit   Assessment & Plan:   Principal Problem:   AKI (acute kidney injury) ([place]) Active Problems:   Atrial fibrillation (HCC)   Chronic diastolic heart failure (HCC)   Type 2 diabetes mellitus without complication (HCC)   Esophageal reflux   Essential hypertension   BPH (benign prostatic hyperplasia)   Acute on chronic anemia   Acute hyponatremia  # Acute kidney injury # Chronic stage 3 b kidney disease Baseline creatinine mid-1s, here 2.78 in setting of decreased urine output. Bladder scan this morning 54. Received blood and fluids and creatinine improved to 2.57. - monitor I/Os - f/u urinalysis - f/u renal u/s - gentle fluids  # Dementia Wife says mental status is at baseline  # COPD exacerbation This morning no hypoxic but tachypnic, wheezing, thought perhaps fluid overload but no overt edema seen on cxr. Treated with iv lasix and breathing treatment. Improved this pm. Think more likely copd exacerbation.  - scheduled duonebs - add prednisone  # Acute on chronic anemia Baseline hgb 8-10, here 6.5 on presentation. Admitted last year for GIB, upper and lower endoscopy normal. Wife does report black stools. Transfused 1 unit with hgb 7.4 this morning - f/u fecal occult blood - hold eliquis - monitor for now  # Hyponatremia Mild, likely 2/2 renal dysfunction, improved to 133 with fluids - monitor  # T2DM On tresiba 30 nightly. Normal fasting glucose this morning - SSI, lantus 8 nightly.  # Chronic diastolic heart failure - home metoprolol - resume home torsemide 20 bid, kcl 20 qd  # Hypothyroid - home synthroid  # BPH Bladder scan wnl does not appear to be retaining - home flomax  # COPD - Home spiriva,  budesonide  # HTN Here normotensive - cont home dilt  # Chronic pain, anxiety - home duloxetine, gabapentin  DVT prophylaxis: SCDs Code Status: full Family Communication: wife updated @ bedside (using Database administrator)  Level of care: Med-Surg Status is: Inpatient  Remains inpatient appropriate because:Inpatient level of care appropriate due to severity of illness  Dispo: The patient is from: Home              Anticipated d/c is to: Home              Patient currently is not medically stable to d/c.   Difficult to place patient No        Consultants:  none  Procedures: none  Antimicrobials:  none    Subjective: This morning denies pain. Is tachypnic. No vomiting.  Objective: Vitals:   07/16/20 0133 07/16/20 0153 07/16/20 0420 07/16/20 0754  BP: 116/64 108/71 (!) 147/116 104/66  Pulse: 61 (!) 59 65 67  Resp: (!) 22 19 20 18   Temp: 97.8 F (36.6 C) 97.8 F (36.6 C) 97.8 F (36.6 C) 97.8 F (36.6 C)  TempSrc: Oral Oral Oral   SpO2: 99% 100% 99% 99%  Weight:      Height:        Intake/Output Summary (Last 24 hours) at 07/16/2020 0857 Last data filed at 07/16/2020 0420 Gross per 24 hour  Intake 275 ml  Output --  Net 275 ml   Filed Weights   07/15/20 1841  Weight: 86.2 kg  Examination:  General exam: Appears calm and comfortable  Respiratory system: tachypnic, retracing, wheezing.  Cardiovascular system: S1 & S2 heard, RRR. No JVD, murmurs, rubs, gallops or clicks. No pedal edema. Gastrointestinal system: Abdomen is obese, soft and nontender. No organomegaly or masses felt. Normal bowel sounds heard. Central nervous system: Alert and oriented. No focal neurological deficits. Extremities: Symmetric 5 x 5 power. Skin: No rashes, lesions or ulcers Psychiatry: alert    Data Reviewed: I have personally reviewed following labs and imaging studies  CBC: Recent Labs  Lab 07/15/20 1846 07/16/20 0730  WBC 8.3 7.6  HGB 6.5* 7.4*  HCT 21.4*  23.2*  MCV 92.2 90.3  PLT 173 737   Basic Metabolic Panel: Recent Labs  Lab 07/15/20 1846 07/16/20 0730  NA 130* 133*  K 4.0 3.6  CL 91* 92*  CO2 30 29  GLUCOSE 245* 100*  BUN 62* 64*  CREATININE 2.78* 2.57*  CALCIUM 8.7* 8.6*  MG 2.4 2.3  PHOS  --  5.2*   GFR: Estimated Creatinine Clearance: 23.6 mL/min (A) (by C-G formula based on SCr of 2.57 mg/dL (H)). Liver Function Tests: Recent Labs  Lab 07/16/20 0730  AST 25  ALT 14  ALKPHOS 65  BILITOT 1.4*  PROT 7.1  ALBUMIN 3.2*   No results for input(s): LIPASE, AMYLASE in the last 168 hours. No results for input(s): AMMONIA in the last 168 hours. Coagulation Profile: Recent Labs  Lab 07/15/20 2222  INR 1.6*   Cardiac Enzymes: Recent Labs  Lab 07/15/20 2127  CKTOTAL 133   BNP (last 3 results) No results for input(s): PROBNP in the last 8760 hours. HbA1C: No results for input(s): HGBA1C in the last 72 hours. CBG: Recent Labs  Lab 07/15/20 2313 07/16/20 0755  GLUCAP 200* 93   Lipid Profile: No results for input(s): CHOL, HDL, LDLCALC, TRIG, CHOLHDL, LDLDIRECT in the last 72 hours. Thyroid Function Tests: Recent Labs    07/15/20 2127  TSH 7.182*   Anemia Panel: Recent Labs    07/15/20 2126  FOLATE 11.8  FERRITIN 15*  TIBC 479*  IRON 19*  RETICCTPCT 6.6*   Urine analysis:    Component Value Date/Time   COLORURINE YELLOW (A) 04/06/2019 1954   APPEARANCEUR CLEAR (A) 04/06/2019 1954   APPEARANCEUR Clear 07/17/2018 1601   LABSPEC 1.012 04/06/2019 1954   LABSPEC 1.004 05/06/2014 2100   PHURINE 5.0 04/06/2019 [place] NEGATIVE 04/06/2019 1954   GLUCOSEU Negative 05/06/2014 2100   HGBUR NEGATIVE 04/06/2019 [place] NEGATIVE 04/06/2019 1954   BILIRUBINUR Negative 07/17/2018 1601   BILIRUBINUR Negative 05/06/2014 2100   KETONESUR NEGATIVE 04/06/2019 [place] NEGATIVE 04/06/2019 1954   NITRITE NEGATIVE 04/06/2019 1954   LEUKOCYTESUR NEGATIVE 04/06/2019 1954    LEUKOCYTESUR Negative 05/06/2014 2100   Sepsis Labs: @LABRCNTIP (procalcitonin:4,lacticidven:4)  ) Recent Results (from the past 240 hour(s))  Resp Panel by RT-PCR (Flu A&B, Covid) Nasopharyngeal Swab     Status: None   Collection Time: 07/15/20  9:24 PM   Specimen: Nasopharyngeal Swab; Nasopharyngeal(NP) swabs in vial transport medium  Result Value Ref Range Status   SARS Coronavirus 2 by RT PCR NEGATIVE NEGATIVE Final    Comment: (NOTE) SARS-CoV-2 target nucleic acids are NOT DETECTED.  The SARS-CoV-2 RNA is generally detectable in upper respiratory specimens during the acute phase of infection. The lowest concentration of SARS-CoV-2 viral copies this assay can detect is 138 copies/mL. A negative result does not preclude SARS-Cov-2 infection and should not be  used as the sole basis for treatment or other patient management decisions. A negative result may occur with  improper specimen collection/handling, submission of specimen other than nasopharyngeal swab, presence of viral mutation(s) within the areas targeted by this assay, and inadequate number of viral copies(<138 copies/mL). A negative result must be combined with clinical observations, patient history, and epidemiological information. The expected result is Negative.  Fact Sheet for Patients:  EntrepreneurPulse.com.au  Fact Sheet for Healthcare Providers:  IncredibleEmployment.be  This test is no t yet approved or cleared by the [place] FDA and  has been authorized for detection and/or diagnosis of SARS-CoV-2 by FDA under an Emergency Use Authorization (EUA). This EUA will remain  in effect (meaning this test can be used) for the duration of the COVID-19 declaration under Section 564(b)(1) of the Act, 21 U.S.C.section 360bbb-3(b)(1), unless the authorization is terminated  or revoked sooner.       Influenza A by PCR NEGATIVE NEGATIVE Final   Influenza B by PCR NEGATIVE  NEGATIVE Final    Comment: (NOTE) The Xpert Xpress SARS-CoV-2/FLU/RSV plus assay is intended as an aid in the diagnosis of influenza from Nasopharyngeal swab specimens and should not be used as a sole basis for treatment. Nasal washings and aspirates are unacceptable for Xpert Xpress SARS-CoV-2/FLU/RSV testing.  Fact Sheet for Patients: EntrepreneurPulse.com.au  Fact Sheet for Healthcare Providers: IncredibleEmployment.be  This test is not yet approved or cleared by the [place] FDA and has been authorized for detection and/or diagnosis of SARS-CoV-2 by FDA under an Emergency Use Authorization (EUA). This EUA will remain in effect (meaning this test can be used) for the duration of the COVID-19 declaration under Section 564(b)(1) of the Act, 21 U.S.C. section 360bbb-3(b)(1), unless the authorization is terminated or revoked.  Performed at [facility], [address], [place], [place] [address]          Radiology Studies: [facility] Chest [place] 1 View  Result Date: 07/15/2020 CLINICAL DATA:  80 year old male with urinary retention. EXAM: PORTABLE CHEST 1 VIEW COMPARISON:  Chest radiograph dated 03/08/2020. FINDINGS: There is cardiomegaly with mild vascular congestion. No focal consolidation, pleural effusion, or pneumothorax. Atherosclerotic calcification of the aorta. No acute osseous pathology. IMPRESSION: Cardiomegaly with mild vascular congestion. Electronically Signed   By: Anner Crete M.D.   On: 07/15/2020 22:54        Scheduled Meds:  apixaban  2.5 mg Oral BID   fluticasone furoate-vilanterol  1 puff Inhalation Daily   furosemide  80 mg Intravenous Once   insulin aspart  0-9 Units Subcutaneous TID WC   insulin glargine  8 Units Subcutaneous QHS   latanoprost  1 drop Both Eyes QHS   levothyroxine  88 mcg Oral Q0600   metoprolol tartrate  50 mg Oral BID   pantoprazole  40 mg Oral Daily   tamsulosin  0.4 mg Oral Daily    tiotropium  18 mcg Inhalation Daily   Continuous Infusions:   LOS: 1 day    Time spent: 35 min    Desma Maxim, MD [facility]   If 7PM-7AM, please contact night-coverage [email or web address] Password [facility] 07/16/2020, 8:57 AM

## 2020-07-16 NOTE — Progress Notes (Signed)
Initial Nutrition Assessment  DOCUMENTATION CODES:  Not applicable  INTERVENTION:  Continue current diet as ordered, encourage PO intake Nepro Shake po 1x/d, each supplement provides 425 kcal and 19 grams protein  NUTRITION DIAGNOSIS:  Inadequate oral intake related to decreased appetite as evidenced by per patient/family report.  GOAL:  Patient will meet greater than or equal to 90% of their needs  MONITOR:  PO intake, Supplement acceptance  REASON FOR ASSESSMENT:  Malnutrition Screening Tool    ASSESSMENT:  80 y.o. male with medical history significant for CKD3 (baseline creatinine 1.4-1.8), HTN, dementia, CHF, type 2 DM, COPD, atrial fibrillation, GERD, hypothyroidism, HLD, presented to ED from home due to diminished urine output.  Admitted with acute kidney injury superimposed on CKD3  Pt with other providers at two attempted visits today. Will defer interview to later date. Reviewed chart and noted 6.3% weight loss x 6 months which is not severe. No intake recorded yet this admission. Creatinine improved since admission. Will add nutrition supplement and monitor intake trends.  Nutritionally Relevant Medications: Scheduled Meds:  insulin aspart  0-9 Units Subcutaneous TID WC   insulin glargine  8 Units Subcutaneous QHS   pantoprazole  40 mg Oral Daily   potassium chloride SA  20 mEq Oral Daily   torsemide  20 mg Oral BID   Labs Reviewed: Na 133 / Chloride 92 BUN 64, creatinine 2.57 Phosphorus 5.2 SBG ranges from 93-200 mg/dL over the last 24 hours HgbA1c 8.5% (12/02/17)  NUTRITION - FOCUSED PHYSICAL EXAM: Defer to follow-up assessment  Diet Order:   Diet Order             Diet Carb Modified Fluid consistency: Thin; Room service appropriate? Yes  Diet effective now                  EDUCATION NEEDS:  No education needs have been identified at this time  Skin:  Skin Assessment: Reviewed RN Assessment  Last BM:  6/22  Height:  Ht Readings from Last 1  Encounters:  07/15/20 5\' 7"  (1.702 m)    Weight:  Wt Readings from Last 1 Encounters:  07/15/20 86.2 kg    Ideal Body Weight:  67.3 kg  BMI:  Body mass index is 29.76 kg/m.  Estimated Nutritional Needs:  Kcal:  1800-2000 kcal/d Protein:  90-100 g/d Fluid:  1.8-2 L/d   Ranell Patrick, RD, LDN Clinical Dietitian Pager on [place]

## 2020-07-16 NOTE — Plan of Care (Signed)

## 2020-07-17 ENCOUNTER — Inpatient Hospital Stay (HOSPITAL_COMMUNITY)
Admit: 2020-07-17 | Discharge: 2020-07-17 | Disposition: A | Discharge status: Home / Self Care | Payer: Medicare Other | Attending: Obstetrics and Gynecology | Admitting: Obstetrics and Gynecology

## 2020-07-17 DIAGNOSIS — I4891 Unspecified atrial fibrillation: Secondary | ICD-10-CM

## 2020-07-17 DIAGNOSIS — I5031 Acute diastolic (congestive) heart failure: Secondary | ICD-10-CM

## 2020-07-17 LAB — ECHOCARDIOGRAM COMPLETE
Height: 67 in
S' Lateral: 2.71 cm
Weight: 3040 oz

## 2020-07-17 LAB — BASIC METABOLIC PANEL
Anion gap: 7 (ref 5–15)
BUN: 65 mg/dL — ABNORMAL HIGH (ref 8–23)
CO2: 32 mmol/L (ref 22–32)
Calcium: 8.5 mg/dL — ABNORMAL LOW (ref 8.9–10.3)
Chloride: 95 mmol/L — ABNORMAL LOW (ref 98–111)
Creatinine, Ser: 1.89 mg/dL — ABNORMAL HIGH (ref 0.61–1.24)
GFR, Estimated: 35 mL/min — ABNORMAL LOW (ref >60)
Glucose, Bld: 80 mg/dL (ref 70–99)
Potassium: 3 mmol/L — ABNORMAL LOW (ref 3.5–5.1)
Sodium: 134 mmol/L — ABNORMAL LOW (ref 135–145)

## 2020-07-17 LAB — GLUCOSE, CAPILLARY
Glucose-Capillary: 71 mg/dL (ref 70–99)
Glucose-Capillary: 238 mg/dL — ABNORMAL HIGH (ref 70–99)

## 2020-07-17 LAB — BPAM RBC
Blood Product Expiration Date: 202207232359
ISSUE DATE / TIME: 202206230128
Unit Type and Rh: 6200

## 2020-07-17 LAB — CBC
HCT: 23.7 % — ABNORMAL LOW (ref 39.0–52.0)
Hemoglobin: 7.4 g/dL — ABNORMAL LOW (ref 13.0–17.0)
MCH: 28 pg (ref 26.0–34.0)
MCHC: 31.2 g/dL (ref 30.0–36.0)
MCV: 89.8 fL (ref 80.0–100.0)
Platelets: 190 10*3/uL (ref 150–400)
RBC: 2.64 MIL/uL — ABNORMAL LOW (ref 4.22–5.81)
RDW: 17.1 % — ABNORMAL HIGH (ref 11.5–15.5)
WBC: 7.7 10*3/uL (ref 4.0–10.5)
nRBC: 0 % (ref 0.0–0.2)

## 2020-07-17 LAB — TYPE AND SCREEN
ABO/RH(D): A POS
Antibody Screen: NEGATIVE
Unit division: 0

## 2020-07-17 MED ORDER — POTASSIUM CHLORIDE CRYS ER 20 MEQ PO TBCR
40.0000 meq | EXTENDED_RELEASE_TABLET | Freq: Once | ORAL | Status: AC
  Administered 2020-07-17: 40 meq via ORAL
  Filled 2020-07-17: qty 2

## 2020-07-17 MED ORDER — PREDNISONE 20 MG PO TABS: 40.0000 mg | ORAL_TABLET | Freq: Every day | ORAL | 0 refills | Status: AC

## 2020-07-17 MED ORDER — FUROSEMIDE 10 MG/ML IJ SOLN
80.0000 mg | Freq: Once | INTRAMUSCULAR | Status: AC
  Administered 2020-07-17: 80 mg via INTRAVENOUS
  Filled 2020-07-17: qty 8

## 2020-07-17 NOTE — TOC Progression Note (Signed)
Transition of Care [facility]) - Progression Note    Patient Details  Name: ARBEN PACKMAN MRN: [id number] Date of Birth: [birth year]  [facility]) CM/SW Contact  Anselm Pancoast, RN Phone Number: 07/17/2020, 2:18 PM  Clinical Narrative:    Call from [facility] confirmed patient is active with Hospice services.         Expected Discharge Plan and Services                                                 Social Determinants of Health (SDOH) Interventions    Readmission Risk Interventions Readmission Risk Prevention Plan 01/15/2020  Transportation Screening Complete  Medication Review ([place]) Complete  PCP or Specialist appointment within 3-5 days of discharge Complete  HRI or [place] Complete  SW Recovery Care/Counseling Consult Complete  [place] Not Applicable  Some recent data might be hidden

## 2020-07-17 NOTE — Progress Notes (Signed)
*  PRELIMINARY RESULTS* Echocardiogram 2D Echocardiogram has been performed.  Sherrie Sport 07/17/2020, 2:28 PM

## 2020-07-17 NOTE — TOC Progression Note (Signed)
Transition of Care [facility]) - Progression Note    Patient Details  Name: Derek Shelton MRN: [id number] Date of Birth: Oct 31, 1940  Transition of Care [facility]) CM/SW [place], RN Phone Number: 07/17/2020, 1:00 PM  Clinical Narrative:     Met with the patient and his spouse to discuss DC plan and needs He has everything he needs at home his wife stated, he has 3 in 88 and RW, transportation with their son, he can afford his medication       Expected Discharge Plan and Services           Expected Discharge Date: 07/17/20                                     Social Determinants of Health (SDOH) Interventions    Readmission Risk Interventions Readmission Risk Prevention Plan 01/15/2020  Transportation Screening Complete  Medication Review ([place]) Complete  PCP or Specialist appointment within 3-5 days of discharge Complete  HRI or [place] Complete  SW Recovery Care/Counseling Consult Complete  [place] Not Applicable  Some recent data might be hidden

## 2020-07-17 NOTE — Discharge Summary (Signed)
Derek Shelton RXV:[id number] DOB: 09-12-1940 DOA: 07/15/2020  PCP: Gladstone Lighter, MD  Admit date: 07/15/2020 Discharge date: 07/17/2020  Time spent: 35 minutes  Recommendations for Outpatient Follow-up:  Pcp f/u one week to check kidney function, electrolytes, and hemoglobin  If hemoglobin stable at f/u consider cautious re-start of eliquis, will need careful monitoring for GI bleeding and drop in hemoglobin Close cardiology f/u as well    Discharge Diagnoses:  Principal Problem:   AKI (acute kidney injury) ([place]) Active Problems:   Atrial fibrillation ([place])   Chronic diastolic heart failure ([place])   Type 2 diabetes mellitus without complication ([place])   Esophageal reflux   Essential hypertension   BPH (benign prostatic hyperplasia)   Acute on chronic anemia   Acute hyponatremia   Discharge Condition: fair  Diet recommendation: low sodium  Filed Weights   07/15/20 1841  Weight: 86.2 kg    History of present illness:   Derek Shelton is a 80 y.o. 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    Of note, patient speaks Derek Shelton, requiring interpreter.    The following history is provided by the patient as well as by his son, Derek Shelton, in addition to my discussions with the emergency department physician and via chart review.   The patient presents with complaint of diminished urine output over the last 48 hours, over which time he reports less than 200 cc of urine output, representing a significant decline  from his baseline urine output.  Denies any associated recent worsening of edema in the bilateral lower extremities.  He also denies any associated shortness of breath, orthopnea, or PND.  Not associate with any recent chest pain, palpitations, diaphoresis, dizziness, presyncope, or syncope.  Patient feels that his oral intake may have been slightly lower relative to baseline over the last several days, but denies any overt nausea, vomiting, or diarrhea.  He also denies any recent abdominal pain, melena, or hematochezia.  He also denies any recent gross hematuria.  Denies any recent dysuria.    The patient acknowledges a history of prior GI bleed in June 2021, at which time he underwent EGD and colonoscopy, but does not recall the results of these endoscopic studies.  He denies any subsequent gastrointestinal bleed, and specifically denies any recent melena or hematochezia.  In the setting of paroxysmal atrial fibrillation, he confirms that he is chronically anticoagulated on Eliquis.  Otherwise, denies any additional blood thinners as an outpatient, including no aspirin.  Denies any known history of chronic liver disease, and denies any history of alcohol abuse, or recent consumption of alcohol.  Per chart review, BUN range since 01/16/2020 has been 50-77. no recent trauma or travel.   Per chart review, the patient has a history of CKD 3B with baseline creatinine of 1.4-1.8, most recent prior serum creatinine data point noted to be 1.84 on 02/06/2020.  He also has a history of chronic anemia with baseline hemoglobin of 8-10, with most recent prior hemoglobin of 8.2 in February 2022.  He is on daily oral iron supplementation as an outpatient.   Additionally, per chart review, the patient  has a history of chronic diastolic heart failure, with most recent echocardiogram performed in January 2019 showing a normal left ventricular cavity size, LVEF 65%, no evidence of focal wall motion abnormalities, and no  evidence of significant valvular pathology, while the study was unable to evaluate diastolic function.  Outpatient diuretic regimen includes metolazone on Wednesday and Sunday.  He also has a history of hypothyroidism as well as BPH, for which he is on Flomax.   Denies any recent headache, neck stiffness, rhinitis, rhinorrhea, sore throat, sob, wheezing, cough, or rash.  No recent known COVID-19 exposures.  Hospital Course:  Patient presented with AKI. Initially thought to be secondary to dehydration and given gentle fluids but developed some wheeze and tachypnea with hydration so was given IV lasix and kidney function improved to baseline. Treated for mild copd exacerbation with prednisone and breathing further improved so will continue that at discharge. Hgb on presentation 6.5 from baseline 8s. Was treated for GI bleed last year. No report of hematochezia or melena, no stooling here to send for hemoccult. Was transfused one unit and hgb rose appropriately and was stable. Given the recent GI bleed and new anemia decision made to hold home eliquis until PCP f/u in 1 week for re-check of hgb, would cautiously re-start if hgb stable but would need close monitoring. Family aware of increased risk of stroke so this was a shared decision. Also mild hypokalemia here with lasix, will resume home potassium at discharge but will need close attention to this at f/u as well. Used urdu Optometrist when communicating with patient and his wife.   Procedures: none   Consultations: none  Discharge Exam: Vitals:   07/17/20 0836 07/17/20 0927  BP: 115/73 120/78  Pulse: (!) 126 82  Resp: 20 18  Temp: 98 F (36.7 C) 98.2 F (36.8 C)  SpO2: 100% 100%    General exam: Appears calm and comfortable Respiratory system: faint exp wheeze Cardiovascular system: S1 & S2 heard, RRR. No JVD, murmurs, rubs, gallops or clicks. No pedal edema. Gastrointestinal system: Abdomen is obese, soft and nontender. No organomegaly  or masses felt. Normal bowel sounds heard. Central nervous system: Alert and oriented. No focal neurological deficits. Extremities: Symmetric 5 x 5 power. Skin: No rashes, lesions or ulcers Psychiatry: alert    Discharge Instructions   Discharge Instructions     Diet - low sodium heart healthy   Complete by: As directed    Increase activity slowly   Complete by: As directed       Allergies as of 07/17/2020       Reactions   Carvedilol Shortness Of Breath   Other reaction(s): Asthma, Shortness of Breath   Penicillins Shortness Of Breath   Other reaction(s): Other (See Comments) Other reaction(s): RASH Other reaction(s): RASH Other reaction(s): RASH   Aspirin    Other reaction(s): Other (See Comments) On eliquis already- high risk of bleeding   Aricept [donepezil Hcl] Other (See Comments)   Medication cause significant bradycardia   Catapres [clonidine Hcl]         Medication List     STOP taking these medications    Eliquis 5 MG Tabs tablet Generic drug: apixaban       TAKE these medications    Accu-Chek Guide test strip Generic drug: glucose blood E11.9   TO CHECK BLOOD GLUCOSE TWICE A DAY   atorvastatin 40 MG tablet Commonly known as: LIPITOR Take 1 tablet (40 mg total) by mouth daily at 6 PM.  BD Pen Needle Nano 2nd Gen 32G X 4 MM Misc Generic drug: Insulin Pen Needle USE NIGHTLY WITH TRESIBA   budesonide 0.5 MG/2ML nebulizer solution Commonly known as: PULMICORT Inhale 0.5 mg into the lungs 2 (two) times daily. For 10 days   budesonide-formoterol 160-4.5 MCG/ACT inhaler Commonly known as: SYMBICORT Inhale 2 puffs into the lungs 2 (two) times daily.   diltiazem 180 MG 24 hr capsule Commonly known as: Cardizem CD Take 1 capsule (180 mg total) by mouth daily.   donepezil 5 MG tablet Commonly known as: ARICEPT Take 5 mg by mouth at bedtime.   DULoxetine 60 MG capsule Commonly known as: CYMBALTA Take 60 mg by mouth daily.    ergocalciferol 1.25 MG (50000 UT) capsule Commonly known as: VITAMIN D2 Take 50,000 Units by mouth once a week.   ferrous sulfate 325 (65 FE) MG tablet Take 325 mg by mouth daily.   formoterol 20 MCG/2ML nebulizer solution Commonly known as: PERFOROMIST Inhale 2 mLs into the lungs 2 (two) times daily.   insulin degludec 100 UNIT/ML FlexTouch Pen Commonly known as: TRESIBA Inject 30 Units into the skin at bedtime.   ipratropium-albuterol 0.5-2.5 (3) MG/3ML Soln Commonly known as: DUONEB Inhale 3 mLs into the lungs every 6 (six) hours as needed for shortness of breath.   latanoprost 0.005 % ophthalmic solution Commonly known as: XALATAN latanoprost 0.005 % eye drops  INSTILL ONE DROP TO BOTH EYES AT BEDTIME.   levothyroxine 88 MCG tablet Commonly known as: SYNTHROID Take 88 mcg by mouth every morning.   loratadine 10 MG tablet Commonly known as: CLARITIN Take 10 mg by mouth daily.   melatonin 3 MG Tabs tablet Take 3 mg by mouth at bedtime as needed (sleep).   metolazone 2.5 MG tablet Commonly known as: ZAROXOLYN Take 1 tablet (2.5 mg total) by mouth daily for 5 days. What changed:  when to take this additional instructions   metoprolol tartrate 50 MG tablet Commonly known as: LOPRESSOR Take 1 tablet (50 mg total) by mouth 2 (two) times daily.   Neurontin 100 MG capsule Generic drug: gabapentin Take 100 mg by mouth 3 (three) times daily.   omega-3 acid ethyl esters 1 g capsule Commonly known as: LOVAZA Take 1 g by mouth daily.   pantoprazole 40 MG tablet Commonly known as: PROTONIX Take 40 mg by mouth daily.   Potassium Chloride ER 20 MEQ Tbcr Take 1 tablet by mouth daily.   predniSONE 20 MG tablet Commonly known as: DELTASONE Take 2 tablets (40 mg total) by mouth daily with breakfast. Start taking on: July 18, 2020   sildenafil 20 MG tablet Commonly known as: REVATIO Take 20 mg by mouth 3 (three) times daily.   tamsulosin 0.4 MG Caps  capsule Commonly known as: FLOMAX Take 0.4 mg by mouth daily.   tiotropium 18 MCG inhalation capsule Commonly known as: SPIRIVA Place 18 mcg into inhaler and inhale daily.   torsemide 20 MG tablet Commonly known as: DEMADEX Take 1 tablet (20 mg total) by mouth daily as needed (for swelling).       Allergies  Allergen Reactions   Carvedilol Shortness Of Breath    Other reaction(s): Asthma, Shortness of Breath   Penicillins Shortness Of Breath    Other reaction(s): Other (See Comments) Other reaction(s): RASH Other reaction(s): RASH Other reaction(s): RASH    Aspirin     Other reaction(s): Other (See Comments) On eliquis already- high risk of bleeding   Aricept [Donepezil Hcl] Other (  See Comments)    Medication cause significant bradycardia   Catapres [Clonidine Hcl]       The results of significant diagnostics from this hospitalization (including imaging, microbiology, ancillary and laboratory) are listed below for reference.    Significant Diagnostic Studies: US RENAL  Result Date: 07/16/2020 CLINICAL DATA:  Acute kidney injury on chronic kidney disease stage III B, history COPD, CHF, coronary artery disease, dementia, diabetes mellitus, hypertension EXAM: RENAL / URINARY TRACT ULTRASOUND COMPLETE COMPARISON:  Ultrasound abdomen 05/01/2017 FINDINGS: Right Kidney: Renal measurements: 10.3 x 5.2 x 5.0 cm = volume: 140 mL. Normal cortical thickness and echogenicity. No mass or hydronephrosis. Degradation of image quality secondary to body habitus. Left Kidney: Renal measurements: 11.2 x 4.9 x 5.6 cm = volume: 162 mL. Normal cortical thickness and echogenicity. Exophytic cyst at lower pole 6.0 x 4.1 x 4.9 cm, simple features. Additional small cyst identified at mid upper kidney 4.3 x 2.6 x 3.6 cm. No additional mass or hydronephrosis. Bladder: Appears normal for degree of bladder distention. Other: N/A IMPRESSION: LEFT renal cysts. Remainder of exam unremarkable. Electronically  Signed   By: Lavonia Dana M.D.   On: 07/16/2020 14:45   DG Chest Port 1 View  Result Date: 07/16/2020 CLINICAL DATA:  Dyspnea EXAM: PORTABLE CHEST 1 VIEW COMPARISON:  07/15/2020 FINDINGS: Unchanged cardiomegaly. Unchanged pulmonary vascular prominence without overt edema. The visualized skeletal structures are unremarkable. IMPRESSION: Unchanged cardiomegaly. Unchanged pulmonary vascular prominence without overt edema. Electronically Signed   By: Eddie Candle M.D.   On: 07/16/2020 10:14   DG Chest Port 1 View  Result Date: 07/15/2020 CLINICAL DATA:  80 year old male with urinary retention. EXAM: PORTABLE CHEST 1 VIEW COMPARISON:  Chest radiograph dated 03/08/2020. FINDINGS: There is cardiomegaly with mild vascular congestion. No focal consolidation, pleural effusion, or pneumothorax. Atherosclerotic calcification of the aorta. No acute osseous pathology. IMPRESSION: Cardiomegaly with mild vascular congestion. Electronically Signed   By: Anner Crete M.D.   On: 07/15/2020 22:54    Microbiology: Recent Results (from the past 240 hour(s))  Resp Panel by RT-PCR (Flu A&B, Covid) Nasopharyngeal Swab     Status: None   Collection Time: 07/15/20  9:24 PM   Specimen: Nasopharyngeal Swab; Nasopharyngeal(NP) swabs in vial transport medium  Result Value Ref Range Status   SARS Coronavirus 2 by RT PCR NEGATIVE NEGATIVE Final    Comment: (NOTE) SARS-CoV-2 target nucleic acids are NOT DETECTED.  The SARS-CoV-2 RNA is generally detectable in upper respiratory specimens during the acute phase of infection. The lowest concentration of SARS-CoV-2 viral copies this assay can detect is 138 copies/mL. A negative result does not preclude SARS-Cov-2 infection and should not be used as the sole basis for treatment or other patient management decisions. A negative result may occur with  improper specimen collection/handling, submission of specimen other than nasopharyngeal swab, presence of viral mutation(s)  within the areas targeted by this assay, and inadequate number of viral copies(<138 copies/mL). A negative result must be combined with clinical observations, patient history, and epidemiological information. The expected result is Negative.  Fact Sheet for Patients:  EntrepreneurPulse.com.au  Fact Sheet for Healthcare Providers:  IncredibleEmployment.be  This test is no t yet approved or cleared by the [place] FDA and  has been authorized for detection and/or diagnosis of SARS-CoV-2 by FDA under an Emergency Use Authorization (EUA). This EUA will remain  in effect (meaning this test can be used) for the duration of the COVID-19 declaration under Section 564(b)(1) of the Act, 21  U.S.C.section 360bbb-3(b)(1), unless the authorization is terminated  or revoked sooner.       Influenza A by PCR NEGATIVE NEGATIVE Final   Influenza B by PCR NEGATIVE NEGATIVE Final    Comment: (NOTE) The Xpert Xpress SARS-CoV-2/FLU/RSV plus assay is intended as an aid in the diagnosis of influenza from Nasopharyngeal swab specimens and should not be used as a sole basis for treatment. Nasal washings and aspirates are unacceptable for Xpert Xpress SARS-CoV-2/FLU/RSV testing.  Fact Sheet for Patients: EntrepreneurPulse.com.au  Fact Sheet for Healthcare Providers: IncredibleEmployment.be  This test is not yet approved or cleared by the [place] FDA and has been authorized for detection and/or diagnosis of SARS-CoV-2 by FDA under an Emergency Use Authorization (EUA). This EUA will remain in effect (meaning this test can be used) for the duration of the COVID-19 declaration under Section 564(b)(1) of the Act, 21 U.S.C. section 360bbb-3(b)(1), unless the authorization is terminated or revoked.  Performed at [facility], [name] [place], [place] [address]      Labs: Basic Metabolic Panel: Recent  Labs  Lab 07/15/20 1846 07/16/20 0730 07/17/20 0434  NA 130* 133* 134*  K 4.0 3.6 3.0*  CL 91* 92* 95*  CO2 30 29 32  GLUCOSE 245* 100* 80  BUN 62* 64* 65*  CREATININE 2.78* 2.57* 1.89*  CALCIUM 8.7* 8.6* 8.5*  MG 2.4 2.3  --   PHOS  --  5.2*  --    Liver Function Tests: Recent Labs  Lab 07/16/20 0730  AST 25  ALT 14  ALKPHOS 65  BILITOT 1.4*  PROT 7.1  ALBUMIN 3.2*   No results for input(s): LIPASE, AMYLASE in the last 168 hours. No results for input(s): AMMONIA in the last 168 hours. CBC: Recent Labs  Lab 07/15/20 1846 07/16/20 0730 07/17/20 0434  WBC 8.3 7.6 7.7  HGB 6.5* 7.4* 7.4*  HCT 21.4* 23.2* 23.7*  MCV 92.2 90.3 89.8  PLT 173 174 190   Cardiac Enzymes: Recent Labs  Lab 07/15/20 2127  CKTOTAL 133   BNP: BNP (last 3 results) Recent Labs    01/14/20 1829 01/16/20 0403 07/16/20 0730  BNP 301.2* 386.5* 210.0*    ProBNP (last 3 results) No results for input(s): PROBNP in the last 8760 hours.  CBG: Recent Labs  Lab 07/16/20 1208 07/16/20 1605 07/16/20 2149 07/17/20 0759 07/17/20 1150  GLUCAP 149* 158* 114* 71 238*       Signed:  Desma Maxim MD.  [facility] 07/17/2020, 12:43 PM

## 2020-07-17 NOTE — Progress Notes (Signed)
VSS, IV cath. Removed site clean, dry and intact. Pt transported via w/c with all belongings down to personal car, family verbalized understanding of the all the information provided.

## 2020-07-18 LAB — METHYLMALONIC ACID, SERUM: Methylmalonic Acid, Quantitative: 897 nmol/L — ABNORMAL HIGH (ref 0–378)

## 2020-07-20 DIAGNOSIS — Z87898 Personal history of other specified conditions: Secondary | ICD-10-CM | POA: Diagnosis not present

## 2020-07-20 DIAGNOSIS — G4733 Obstructive sleep apnea (adult) (pediatric): Secondary | ICD-10-CM | POA: Diagnosis not present

## 2020-07-20 DIAGNOSIS — F17200 Nicotine dependence, unspecified, uncomplicated: Secondary | ICD-10-CM | POA: Diagnosis not present

## 2020-07-20 DIAGNOSIS — I10 Essential (primary) hypertension: Secondary | ICD-10-CM | POA: Diagnosis not present

## 2020-07-20 DIAGNOSIS — R001 Bradycardia, unspecified: Secondary | ICD-10-CM | POA: Diagnosis not present

## 2020-07-20 DIAGNOSIS — I071 Rheumatic tricuspid insufficiency: Secondary | ICD-10-CM | POA: Diagnosis not present

## 2020-07-20 DIAGNOSIS — I639 Cerebral infarction, unspecified: Secondary | ICD-10-CM | POA: Diagnosis not present

## 2020-07-20 DIAGNOSIS — I4819 Other persistent atrial fibrillation: Secondary | ICD-10-CM | POA: Diagnosis not present

## 2020-07-20 DIAGNOSIS — N182 Chronic kidney disease, stage 2 (mild): Secondary | ICD-10-CM | POA: Diagnosis not present

## 2020-07-20 DIAGNOSIS — N1832 Chronic kidney disease, stage 3b: Secondary | ICD-10-CM | POA: Diagnosis not present

## 2020-07-20 DIAGNOSIS — I2721 Secondary pulmonary arterial hypertension: Secondary | ICD-10-CM | POA: Diagnosis not present

## 2020-07-20 DIAGNOSIS — I5032 Chronic diastolic (congestive) heart failure: Secondary | ICD-10-CM | POA: Diagnosis not present

## 2020-07-21 DIAGNOSIS — I5032 Chronic diastolic (congestive) heart failure: Secondary | ICD-10-CM | POA: Diagnosis not present

## 2020-07-21 DIAGNOSIS — E1122 Type 2 diabetes mellitus with diabetic chronic kidney disease: Secondary | ICD-10-CM | POA: Diagnosis not present

## 2020-07-21 DIAGNOSIS — Z09 Encounter for follow-up examination after completed treatment for conditions other than malignant neoplasm: Secondary | ICD-10-CM | POA: Diagnosis not present

## 2020-07-21 DIAGNOSIS — G4733 Obstructive sleep apnea (adult) (pediatric): Secondary | ICD-10-CM | POA: Diagnosis not present

## 2020-07-21 DIAGNOSIS — N1832 Chronic kidney disease, stage 3b: Secondary | ICD-10-CM | POA: Diagnosis not present

## 2020-07-21 DIAGNOSIS — I130 Hypertensive heart and chronic kidney disease with heart failure and stage 1 through stage 4 chronic kidney disease, or unspecified chronic kidney disease: Secondary | ICD-10-CM | POA: Diagnosis not present

## 2020-08-13 DIAGNOSIS — I517 Cardiomegaly: Secondary | ICD-10-CM | POA: Diagnosis not present

## 2020-08-13 DIAGNOSIS — I5032 Chronic diastolic (congestive) heart failure: Secondary | ICD-10-CM | POA: Diagnosis not present

## 2020-08-13 DIAGNOSIS — I509 Heart failure, unspecified: Secondary | ICD-10-CM | POA: Diagnosis not present

## 2020-08-13 DIAGNOSIS — N1832 Chronic kidney disease, stage 3b: Secondary | ICD-10-CM | POA: Diagnosis not present

## 2020-08-13 DIAGNOSIS — J811 Chronic pulmonary edema: Secondary | ICD-10-CM | POA: Diagnosis not present

## 2020-08-13 DIAGNOSIS — J9811 Atelectasis: Secondary | ICD-10-CM | POA: Diagnosis not present

## 2020-08-13 DIAGNOSIS — I482 Chronic atrial fibrillation, unspecified: Secondary | ICD-10-CM | POA: Diagnosis not present

## 2020-08-24 DIAGNOSIS — E1140 Type 2 diabetes mellitus with diabetic neuropathy, unspecified: Secondary | ICD-10-CM | POA: Diagnosis not present

## 2020-08-24 DIAGNOSIS — Z794 Long term (current) use of insulin: Secondary | ICD-10-CM | POA: Diagnosis not present

## 2020-08-24 DIAGNOSIS — B351 Tinea unguium: Secondary | ICD-10-CM | POA: Diagnosis not present

## 2020-08-24 DIAGNOSIS — L600 Ingrowing nail: Secondary | ICD-10-CM | POA: Diagnosis not present

## 2020-09-20 ENCOUNTER — Emergency Department: Payer: Medicare Other

## 2020-09-20 ENCOUNTER — Other Ambulatory Visit: Payer: Self-pay

## 2020-09-20 ENCOUNTER — Inpatient Hospital Stay
Admission: EM | Admit: 2020-09-20 | Discharge: 2020-10-24 | DRG: 177 | Disposition: E | Payer: Medicare Other | Attending: Pulmonary Disease | Admitting: Pulmonary Disease

## 2020-09-20 DIAGNOSIS — Z886 Allergy status to analgesic agent status: Secondary | ICD-10-CM

## 2020-09-20 DIAGNOSIS — Z794 Long term (current) use of insulin: Secondary | ICD-10-CM

## 2020-09-20 DIAGNOSIS — I480 Paroxysmal atrial fibrillation: Secondary | ICD-10-CM | POA: Diagnosis present

## 2020-09-20 DIAGNOSIS — D631 Anemia in chronic kidney disease: Secondary | ICD-10-CM | POA: Diagnosis present

## 2020-09-20 DIAGNOSIS — R000 Tachycardia, unspecified: Secondary | ICD-10-CM | POA: Diagnosis present

## 2020-09-20 DIAGNOSIS — E1122 Type 2 diabetes mellitus with diabetic chronic kidney disease: Secondary | ICD-10-CM | POA: Diagnosis not present

## 2020-09-20 DIAGNOSIS — E1142 Type 2 diabetes mellitus with diabetic polyneuropathy: Secondary | ICD-10-CM | POA: Diagnosis present

## 2020-09-20 DIAGNOSIS — E039 Hypothyroidism, unspecified: Secondary | ICD-10-CM | POA: Diagnosis present

## 2020-09-20 DIAGNOSIS — N170 Acute kidney failure with tubular necrosis: Secondary | ICD-10-CM | POA: Diagnosis present

## 2020-09-20 DIAGNOSIS — J449 Chronic obstructive pulmonary disease, unspecified: Secondary | ICD-10-CM | POA: Diagnosis not present

## 2020-09-20 DIAGNOSIS — N1832 Chronic kidney disease, stage 3b: Secondary | ICD-10-CM | POA: Diagnosis present

## 2020-09-20 DIAGNOSIS — G4733 Obstructive sleep apnea (adult) (pediatric): Secondary | ICD-10-CM | POA: Diagnosis present

## 2020-09-20 DIAGNOSIS — Z720 Tobacco use: Secondary | ICD-10-CM

## 2020-09-20 DIAGNOSIS — Z888 Allergy status to other drugs, medicaments and biological substances status: Secondary | ICD-10-CM

## 2020-09-20 DIAGNOSIS — I130 Hypertensive heart and chronic kidney disease with heart failure and stage 1 through stage 4 chronic kidney disease, or unspecified chronic kidney disease: Secondary | ICD-10-CM | POA: Diagnosis not present

## 2020-09-20 DIAGNOSIS — E669 Obesity, unspecified: Secondary | ICD-10-CM | POA: Diagnosis present

## 2020-09-20 DIAGNOSIS — N179 Acute kidney failure, unspecified: Secondary | ICD-10-CM

## 2020-09-20 DIAGNOSIS — F0390 Unspecified dementia without behavioral disturbance: Secondary | ICD-10-CM | POA: Diagnosis present

## 2020-09-20 DIAGNOSIS — I482 Chronic atrial fibrillation, unspecified: Secondary | ICD-10-CM | POA: Diagnosis not present

## 2020-09-20 DIAGNOSIS — G9341 Metabolic encephalopathy: Secondary | ICD-10-CM | POA: Diagnosis not present

## 2020-09-20 DIAGNOSIS — J9621 Acute and chronic respiratory failure with hypoxia: Secondary | ICD-10-CM | POA: Diagnosis present

## 2020-09-20 DIAGNOSIS — I495 Sick sinus syndrome: Secondary | ICD-10-CM | POA: Diagnosis present

## 2020-09-20 DIAGNOSIS — D696 Thrombocytopenia, unspecified: Secondary | ICD-10-CM | POA: Diagnosis not present

## 2020-09-20 DIAGNOSIS — E875 Hyperkalemia: Secondary | ICD-10-CM | POA: Diagnosis not present

## 2020-09-20 DIAGNOSIS — Z6830 Body mass index (BMI) 30.0-30.9, adult: Secondary | ICD-10-CM

## 2020-09-20 DIAGNOSIS — N1831 Chronic kidney disease, stage 3a: Secondary | ICD-10-CM | POA: Diagnosis present

## 2020-09-20 DIAGNOSIS — J310 Chronic rhinitis: Secondary | ICD-10-CM | POA: Diagnosis present

## 2020-09-20 DIAGNOSIS — I468 Cardiac arrest due to other underlying condition: Secondary | ICD-10-CM | POA: Diagnosis not present

## 2020-09-20 DIAGNOSIS — I071 Rheumatic tricuspid insufficiency: Secondary | ICD-10-CM | POA: Diagnosis present

## 2020-09-20 DIAGNOSIS — Z887 Allergy status to serum and vaccine status: Secondary | ICD-10-CM | POA: Diagnosis not present

## 2020-09-20 DIAGNOSIS — J9600 Acute respiratory failure, unspecified whether with hypoxia or hypercapnia: Secondary | ICD-10-CM | POA: Diagnosis not present

## 2020-09-20 DIAGNOSIS — J9690 Respiratory failure, unspecified, unspecified whether with hypoxia or hypercapnia: Secondary | ICD-10-CM | POA: Diagnosis not present

## 2020-09-20 DIAGNOSIS — Z603 Acculturation difficulty: Secondary | ICD-10-CM | POA: Diagnosis present

## 2020-09-20 DIAGNOSIS — J441 Chronic obstructive pulmonary disease with (acute) exacerbation: Secondary | ICD-10-CM

## 2020-09-20 DIAGNOSIS — Z833 Family history of diabetes mellitus: Secondary | ICD-10-CM

## 2020-09-20 DIAGNOSIS — I4891 Unspecified atrial fibrillation: Secondary | ICD-10-CM | POA: Diagnosis not present

## 2020-09-20 DIAGNOSIS — J9611 Chronic respiratory failure with hypoxia: Secondary | ICD-10-CM | POA: Diagnosis not present

## 2020-09-20 DIAGNOSIS — Z79890 Hormone replacement therapy (postmenopausal): Secondary | ICD-10-CM

## 2020-09-20 DIAGNOSIS — Z7951 Long term (current) use of inhaled steroids: Secondary | ICD-10-CM

## 2020-09-20 DIAGNOSIS — E785 Hyperlipidemia, unspecified: Secondary | ICD-10-CM | POA: Diagnosis present

## 2020-09-20 DIAGNOSIS — G253 Myoclonus: Secondary | ICD-10-CM | POA: Diagnosis not present

## 2020-09-20 DIAGNOSIS — F05 Delirium due to known physiological condition: Secondary | ICD-10-CM | POA: Diagnosis present

## 2020-09-20 DIAGNOSIS — K219 Gastro-esophageal reflux disease without esophagitis: Secondary | ICD-10-CM | POA: Diagnosis present

## 2020-09-20 DIAGNOSIS — I2510 Atherosclerotic heart disease of native coronary artery without angina pectoris: Secondary | ICD-10-CM | POA: Diagnosis present

## 2020-09-20 DIAGNOSIS — I110 Hypertensive heart disease with heart failure: Secondary | ICD-10-CM | POA: Diagnosis not present

## 2020-09-20 DIAGNOSIS — J9811 Atelectasis: Secondary | ICD-10-CM | POA: Diagnosis not present

## 2020-09-20 DIAGNOSIS — D72819 Decreased white blood cell count, unspecified: Secondary | ICD-10-CM | POA: Diagnosis not present

## 2020-09-20 DIAGNOSIS — R0602 Shortness of breath: Secondary | ICD-10-CM | POA: Diagnosis present

## 2020-09-20 DIAGNOSIS — R34 Anuria and oliguria: Secondary | ICD-10-CM | POA: Diagnosis present

## 2020-09-20 DIAGNOSIS — Z79899 Other long term (current) drug therapy: Secondary | ICD-10-CM

## 2020-09-20 DIAGNOSIS — U071 COVID-19: Secondary | ICD-10-CM | POA: Diagnosis not present

## 2020-09-20 DIAGNOSIS — I517 Cardiomegaly: Secondary | ICD-10-CM | POA: Diagnosis not present

## 2020-09-20 DIAGNOSIS — R7989 Other specified abnormal findings of blood chemistry: Secondary | ICD-10-CM | POA: Diagnosis present

## 2020-09-20 DIAGNOSIS — R001 Bradycardia, unspecified: Secondary | ICD-10-CM | POA: Diagnosis present

## 2020-09-20 DIAGNOSIS — M48061 Spinal stenosis, lumbar region without neurogenic claudication: Secondary | ICD-10-CM | POA: Diagnosis present

## 2020-09-20 DIAGNOSIS — I959 Hypotension, unspecified: Secondary | ICD-10-CM | POA: Diagnosis present

## 2020-09-20 DIAGNOSIS — Z87891 Personal history of nicotine dependence: Secondary | ICD-10-CM

## 2020-09-20 DIAGNOSIS — E871 Hypo-osmolality and hyponatremia: Secondary | ICD-10-CM | POA: Diagnosis not present

## 2020-09-20 DIAGNOSIS — I4510 Unspecified right bundle-branch block: Secondary | ICD-10-CM | POA: Diagnosis present

## 2020-09-20 DIAGNOSIS — I10 Essential (primary) hypertension: Secondary | ICD-10-CM | POA: Diagnosis present

## 2020-09-20 DIAGNOSIS — I272 Pulmonary hypertension, unspecified: Secondary | ICD-10-CM | POA: Diagnosis not present

## 2020-09-20 DIAGNOSIS — I2721 Secondary pulmonary arterial hypertension: Secondary | ICD-10-CM | POA: Diagnosis not present

## 2020-09-20 DIAGNOSIS — K922 Gastrointestinal hemorrhage, unspecified: Secondary | ICD-10-CM | POA: Diagnosis not present

## 2020-09-20 DIAGNOSIS — Z7901 Long term (current) use of anticoagulants: Secondary | ICD-10-CM

## 2020-09-20 DIAGNOSIS — N400 Enlarged prostate without lower urinary tract symptoms: Secondary | ICD-10-CM | POA: Diagnosis present

## 2020-09-20 DIAGNOSIS — E119 Type 2 diabetes mellitus without complications: Secondary | ICD-10-CM

## 2020-09-20 DIAGNOSIS — E876 Hypokalemia: Secondary | ICD-10-CM | POA: Diagnosis present

## 2020-09-20 DIAGNOSIS — I5033 Acute on chronic diastolic (congestive) heart failure: Secondary | ICD-10-CM

## 2020-09-20 DIAGNOSIS — Z880 Allergy status to penicillin: Secondary | ICD-10-CM

## 2020-09-20 DIAGNOSIS — I509 Heart failure, unspecified: Secondary | ICD-10-CM | POA: Diagnosis not present

## 2020-09-20 DIAGNOSIS — J9620 Acute and chronic respiratory failure, unspecified whether with hypoxia or hypercapnia: Secondary | ICD-10-CM | POA: Diagnosis not present

## 2020-09-20 DIAGNOSIS — E878 Other disorders of electrolyte and fluid balance, not elsewhere classified: Secondary | ICD-10-CM | POA: Diagnosis present

## 2020-09-20 DIAGNOSIS — Z8673 Personal history of transient ischemic attack (TIA), and cerebral infarction without residual deficits: Secondary | ICD-10-CM

## 2020-09-20 LAB — COMPREHENSIVE METABOLIC PANEL
ALT: 20 U/L (ref 0–44)
AST: 35 U/L (ref 15–41)
Albumin: 3.2 g/dL — ABNORMAL LOW (ref 3.5–5.0)
Alkaline Phosphatase: 73 U/L (ref 38–126)
Anion gap: 9 (ref 5–15)
BUN: 69 mg/dL — ABNORMAL HIGH (ref 8–23)
CO2: 32 mmol/L (ref 22–32)
Calcium: 8.4 mg/dL — ABNORMAL LOW (ref 8.9–10.3)
Chloride: 94 mmol/L — ABNORMAL LOW (ref 98–111)
Creatinine, Ser: 1.75 mg/dL — ABNORMAL HIGH (ref 0.61–1.24)
GFR, Estimated: 39 mL/min — ABNORMAL LOW (ref >60)
Glucose, Bld: 148 mg/dL — ABNORMAL HIGH (ref 70–99)
Potassium: 3.2 mmol/L — ABNORMAL LOW (ref 3.5–5.1)
Sodium: 135 mmol/L (ref 135–145)
Total Bilirubin: 0.8 mg/dL (ref 0.3–1.2)
Total Protein: 6.9 g/dL (ref 6.5–8.1)

## 2020-09-20 LAB — CBC WITH DIFFERENTIAL/PLATELET
Abs Immature Granulocytes: 0.01 10*3/uL (ref 0.00–0.07)
Basophils Absolute: 0 10*3/uL (ref 0.0–0.1)
Basophils Relative: 1 %
Eosinophils Absolute: 0.2 10*3/uL (ref 0.0–0.5)
Eosinophils Relative: 5 %
HCT: 22.9 % — ABNORMAL LOW (ref 39.0–52.0)
Hemoglobin: 7 g/dL — ABNORMAL LOW (ref 13.0–17.0)
Immature Granulocytes: 0 %
Lymphocytes Relative: 21 %
Lymphs Abs: 0.7 10*3/uL (ref 0.7–4.0)
MCH: 28.1 pg (ref 26.0–34.0)
MCHC: 30.6 g/dL (ref 30.0–36.0)
MCV: 92 fL (ref 80.0–100.0)
Monocytes Absolute: 0.6 10*3/uL (ref 0.1–1.0)
Monocytes Relative: 16 %
Neutro Abs: 2 10*3/uL (ref 1.7–7.7)
Neutrophils Relative %: 57 %
Platelets: 131 10*3/uL — ABNORMAL LOW (ref 150–400)
RBC: 2.49 MIL/uL — ABNORMAL LOW (ref 4.22–5.81)
RDW: 15.9 % — ABNORMAL HIGH (ref 11.5–15.5)
WBC: 3.5 10*3/uL — ABNORMAL LOW (ref 4.0–10.5)
nRBC: 0 % (ref 0.0–0.2)

## 2020-09-20 LAB — RESP PANEL BY RT-PCR (FLU A&B, COVID) ARPGX2
Influenza A by PCR: NEGATIVE
Influenza B by PCR: NEGATIVE
SARS Coronavirus 2 by RT PCR: POSITIVE — AB

## 2020-09-20 LAB — PROTIME-INR
INR: 1.7 — ABNORMAL HIGH (ref 0.8–1.2)
Prothrombin Time: 20.2 seconds — ABNORMAL HIGH (ref 11.4–15.2)

## 2020-09-20 LAB — APTT: aPTT: 46 seconds — ABNORMAL HIGH (ref 24–36)

## 2020-09-20 LAB — LACTIC ACID, PLASMA: Lactic Acid, Venous: 0.9 mmol/L (ref 0.5–1.9)

## 2020-09-20 LAB — BRAIN NATRIURETIC PEPTIDE: B Natriuretic Peptide: 288.7 pg/mL — ABNORMAL HIGH (ref 0.0–100.0)

## 2020-09-20 MED ORDER — DILTIAZEM HCL-DEXTROSE 125-5 MG/125ML-% IV SOLN (PREMIX)
5.0000 mg/h | INTRAVENOUS | Status: DC
  Administered 2020-09-20: 5 mg/h via INTRAVENOUS
  Administered 2020-09-21: 15 mg/h via INTRAVENOUS
  Administered 2020-09-22: 5 mg/h via INTRAVENOUS
  Filled 2020-09-20 (×4): qty 125

## 2020-09-20 MED ORDER — DILTIAZEM HCL 25 MG/5ML IV SOLN
10.0000 mg | Freq: Once | INTRAVENOUS | Status: AC
  Administered 2020-09-20: 10 mg via INTRAVENOUS
  Filled 2020-09-20: qty 5

## 2020-09-20 MED ORDER — METHYLPREDNISOLONE SODIUM SUCC 125 MG IJ SOLR
125.0000 mg | Freq: Once | INTRAMUSCULAR | Status: AC
  Administered 2020-09-20: 125 mg via INTRAVENOUS
  Filled 2020-09-20: qty 2

## 2020-09-20 MED ORDER — FUROSEMIDE 10 MG/ML IJ SOLN
60.0000 mg | Freq: Once | INTRAMUSCULAR | Status: AC
  Administered 2020-09-20: 60 mg via INTRAVENOUS
  Filled 2020-09-20: qty 8

## 2020-09-20 MED ORDER — IPRATROPIUM-ALBUTEROL 0.5-2.5 (3) MG/3ML IN SOLN
6.0000 mL | Freq: Once | RESPIRATORY_TRACT | Status: AC
  Administered 2020-09-20: 6 mL via RESPIRATORY_TRACT
  Filled 2020-09-20: qty 6

## 2020-09-20 NOTE — ED Triage Notes (Signed)
Spoke to son who stated that pt wife gave home covid test and came back pos. Brought to ED d/t SOB and wheezing. Pt has prev hx of dementia, afib, Diabetes, and several lung d/o per son on the phone.

## 2020-09-20 NOTE — ED Triage Notes (Addendum)
Pt is covid positive, is shob. Pt appears uncomfortable, pt with ra pox of 86-88%, hr 110-120 with movement. Pt taken to room 1 and placed on oxgyen at 2lpm. Not able to obtain a blood pressure in triage room, will check blood pressure in room one.

## 2020-09-20 NOTE — ED Notes (Signed)
Verbal given to md about pos covid status.

## 2020-09-20 NOTE — ED Provider Notes (Signed)
[facility] Emergency Department Provider Note ____________________________________________   Event Date/Time   First MD Initiated Contact with Patient 09/03/2020 2100     (approximate)  I have reviewed the triage vital signs and the nursing notes.  HISTORY  Chief Complaint Shortness of Breath   HPI Derek Shelton is a 80 y.o. malewho presents to the ED for evaluation of shortness of breath.  Chart review indicates CKD, diastolic CHF, COPD, paroxysmal A. fib on Eliquis, DM, GERD and CAD.  Chronic respiratory failure on 2 L home O2.  Family dropped the patient off to the ED for evaluation of worsening shortness of breath and positive home COVID test.  History is difficult due to patient being demented and not speaking [place].  His son later calls in and provide some history over the phone, and later at the time of admission he does arrive in person.   Patient has reportedly been more short of breath over the past few days, there was positive home contacts with COVID earlier this week, and patient tested positive today for COVID-19.  History somewhat limited due to his disorientation, dementia and the language barrier  Past Medical History:  Diagnosis Date   Anemia    Asthma    CHF (congestive heart failure) (HCC)    COPD (chronic obstructive pulmonary disease) ([place])    Coronary artery disease    Dementia ([place])    Per son's report   Diabetes mellitus without complication ([place])    Dysrhythmia    Atrial Fibrillation   GERD (gastroesophageal reflux disease)    GI bleed    Hyperlipidemia    Hypertension    Hypothyroidism    Shortness of breath dyspnea    Sleep apnea    Stroke [facility])    Thyroid disease     Patient Active Problem List   Diagnosis Date Noted   Acute on chronic anemia 07/15/2020   Acute hyponatremia 07/15/2020   Acute respiratory disease due to COVID-19 virus 01/14/2020   COPD (chronic obstructive pulmonary disease) (HCC)     Hypothyroidism    Acute respiratory failure with hypoxia ([place]) 08/06/2019   Acute on chronic diastolic CHF (congestive heart failure) ([place]) 08/06/2019   History of GI bleed 08/06/2019   History of bradycardia 08/06/2019   Moderate tricuspid regurgitation 08/06/2019   PAH (pulmonary artery hypertension) ([place]) 08/06/2019   History of CVA (cerebrovascular accident) 08/06/2019   LLL pneumonia 08/06/2019   HCAP (healthcare-associated pneumonia) 08/06/2019   Acute CHF (congestive heart failure) ([place]) 08/06/2019   Dementia without behavioral disturbance ([place])    Normocytic anemia 07/06/2019   Hypokalemia 04/02/2019   Proteinuria 03/04/2019   Retention of urine 03/04/2019   Chronic kidney disease, stage 3a ([place]) 03/04/2019   Type II diabetes mellitus with renal manifestations ([place]) 03/04/2019   Chronic kidney disease, stage 2 (mild) 01/04/2019   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   BPH (benign prostatic hyperplasia) 07/18/2018   Degeneration of lumbar intervertebral disc 07/17/2018   Osteoarthritis of knee 07/17/2018   Spinal stenosis of lumbar region 07/17/2018   Acute CVA (cerebrovascular accident) ([place]) 12/02/2017   Acute ischemic stroke ([place]) [id number]   Acute metabolic encephalopathy due to hypoglycemia 11/29/2017   Essential hypertension 11/29/2017   Syncope and collapse 11/28/2017   Atrial fibrillation, chronic ([place]) 05/22/2017   Chest pain 02/21/2017   AKI (acute kidney injury) ([place]) 02/21/2017   SOB (shortness of breath) 02/08/2017   Atrial fibrillation with RVR ([place]) 02/08/2017  Hyperlipidemia 09/23/2014   GI bleeding 08/06/2014   Anemia 08/06/2014   Bradycardia 08/06/2014   Atrial fibrillation ([place]) 08/06/2014   Hyponatremia 08/06/2014   Chronic diastolic heart failure ([place]) 08/06/2014   OSA (obstructive sleep apnea) 08/06/2014   Memory loss or impairment 05/29/2014   Current smoker 09/19/2013   Asthma 11/29/2012   Dizziness 11/15/2012   Other  dyspnea and respiratory abnormality 11/15/2012   Pain in joint involving lower leg 01/05/2011   Type 2 diabetes mellitus without complication ([place]) 0000000   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   Chronic rhinitis 08/11/2010   Coronary artery disease 08/11/2010   Esophageal reflux 08/11/2010   Personal history of arthritis 08/11/2010   Tear film insufficiency 06/07/2010   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   Keratoconjunctivitis sicca ([place]) 03/10/2010   Lens replaced 03/10/2010   Pterygium 03/10/2010   Gastro-esophageal reflux disease with esophagitis 01/30/2006    Past Surgical History:  Procedure Laterality Date   CARDIAC CATHETERIZATION     COLONOSCOPY N/A 08/10/2014   Procedure: COLONOSCOPY;  Surgeon: Manya Silvas, MD;  Location: [facility];  Service: Endoscopy;  Laterality: N/A;   COLONOSCOPY WITH PROPOFOL N/A 04/05/2016   Procedure: COLONOSCOPY WITH PROPOFOL;  Surgeon: Lollie Sails, MD;  Location: [facility] ENDOSCOPY;  Service: Endoscopy;  Laterality: N/A;   COLONOSCOPY WITH PROPOFOL N/A 07/10/2019   Procedure: COLONOSCOPY WITH PROPOFOL;  Surgeon: Lucilla Lame, MD;  Location: [facility] ENDOSCOPY;  Service: Endoscopy;  Laterality: N/A;   ESOPHAGOGASTRODUODENOSCOPY N/A 08/08/2014   Procedure: ESOPHAGOGASTRODUODENOSCOPY (EGD);  Surgeon: Manya Silvas, MD;  Location: [facility] ENDOSCOPY;  Service: Endoscopy;  Laterality: N/A;  plan for early afternoon   ESOPHAGOGASTRODUODENOSCOPY (EGD) WITH PROPOFOL N/A 07/10/2019   Procedure: ESOPHAGOGASTRODUODENOSCOPY (EGD) WITH PROPOFOL;  Surgeon: Lucilla Lame, MD;  Location: [facility] ENDOSCOPY;  Service: Endoscopy;  Laterality: N/A;   EYE SURGERY     GIVENS CAPSULE STUDY N/A 08/12/2014   Procedure: GIVENS CAPSULE STUDY;  Surgeon: Manya Silvas, MD;  Location: [facility] ENDOSCOPY;  Service: Endoscopy;  Laterality: N/A;   rectal fistula N/A     Prior to Admission medications   Medication Sig Start Date End Date Taking? Authorizing Provider   ACCU-CHEK GUIDE test strip E11.9   TO CHECK BLOOD GLUCOSE TWICE A DAY 08/06/19  Yes [provider]  apixaban (ELIQUIS) 2.5 MG TABS tablet Take 5 mg by mouth 2 (two) times daily.   Yes [provider]  atorvastatin (LIPITOR) 40 MG tablet Take 1 tablet (40 mg total) by mouth daily at 6 PM. 12/03/17  Yes Demetrios Loll, MD  BD PEN NEEDLE NANO 2ND GEN 32G X 4 MM MISC USE NIGHTLY WITH TRESIBA 08/28/19  Yes [provider]  budesonide-formoterol (SYMBICORT) 160-4.5 MCG/ACT inhaler Inhale 2 puffs into the lungs 2 (two) times daily.   Yes [provider]  diltiazem (CARDIZEM CD) 180 MG 24 hr capsule Take 1 capsule (180 mg total) by mouth daily. 01/21/20 01/20/21 Yes Jennye Boroughs, MD  donepezil (ARICEPT) 5 MG tablet Take 5 mg by mouth at bedtime.   Yes [provider]  DULoxetine (CYMBALTA) 60 MG capsule Take 60 mg by mouth daily.   Yes [provider]  ergocalciferol (VITAMIN D2) 1.25 MG (50000 UT) capsule Take 50,000 Units by mouth once a week.   Yes [provider]  esomeprazole (NEXIUM) 40 MG capsule Take 40 mg by mouth daily at 12 noon.   Yes [provider]  ferrous sulfate 325 (65 FE) MG tablet Take 325 mg by  mouth daily. 12/07/19  Yes [provider]  finasteride (PROSCAR) 5 MG tablet Take 5 mg by mouth daily.   Yes [provider]  formoterol (PERFOROMIST) 20 MCG/2ML nebulizer solution Inhale 2 mLs into the lungs 2 (two) times daily. 12/16/19 12/15/20 Yes [provider]  insulin degludec (TRESIBA) 100 UNIT/ML FlexTouch Pen Inject 30 Units into the skin at bedtime.   Yes [provider]  ipratropium-albuterol (DUONEB) 0.5-2.5 (3) MG/3ML SOLN Inhale 3 mLs into the lungs every 6 (six) hours as needed for shortness of breath. 01/22/17  Yes [provider]  latanoprost (XALATAN) 0.005 % ophthalmic solution latanoprost 0.005 % eye drops  INSTILL ONE DROP TO BOTH EYES AT BEDTIME.   Yes  [provider]  levothyroxine (SYNTHROID) 88 MCG tablet Take 88 mcg by mouth every morning. 07/08/20  Yes [provider]  loratadine (CLARITIN) 10 MG tablet Take 10 mg by mouth daily. 12/17/19  Yes [provider]  Melatonin 3 MG TABS Take 3 mg by mouth at bedtime as needed (sleep).    Yes [provider]  metolazone (ZAROXOLYN) 2.5 MG tablet Take 1 tablet (2.5 mg total) by mouth daily for 5 days. Patient taking differently: Take 2.5 mg by mouth as directed. Take on weds and Sundays 02/07/20 08/25/2020 Yes Bradler, Vista Lawman, MD  metoprolol tartrate (LOPRESSOR) 50 MG tablet Take 1 tablet (50 mg total) by mouth 2 (two) times daily. Patient taking differently: Take 25 mg by mouth 2 (two) times daily. 01/21/20  Yes Jennye Boroughs, MD  NEURONTIN 100 MG capsule Take 100 mg by mouth 2 (two) times daily. 07/09/20  Yes [provider]  omega-3 acid ethyl esters (LOVAZA) 1 G capsule Take 1 g by mouth daily.    Yes [provider]  pantoprazole (PROTONIX) 40 MG tablet Take 40 mg by mouth daily. 06/04/19  Yes [provider]  Potassium Chloride ER 20 MEQ TBCR Take 1 tablet by mouth daily. 06/11/20  Yes [provider]  sildenafil (REVATIO) 20 MG tablet Take 20 mg by mouth 3 (three) times daily. 05/09/19  Yes [provider]  tamsulosin (FLOMAX) 0.4 MG CAPS capsule Take 0.4 mg by mouth daily. 05/07/19  Yes [provider]  tiotropium (SPIRIVA) 18 MCG inhalation capsule Place 18 mcg into inhaler and inhale daily.   Yes [provider]  torsemide (DEMADEX) 20 MG tablet Take 1 tablet (20 mg total) by mouth daily as needed (for swelling). 01/21/20  Yes Jennye Boroughs, MD  budesonide (PULMICORT) 0.5 MG/2ML nebulizer solution Inhale 0.5 mg into the lungs 2 (two) times daily. For 10 days Patient not taking: Reported on 08/28/2020 12/11/19   [provider]  predniSONE (DELTASONE) 20 MG tablet Take 2 tablets (40 mg total)  by mouth daily with breakfast. Patient not taking: No sig reported 07/18/20   Wouk, Ailene Rud, MD    Allergies Carvedilol, Penicillins, Aspirin, Aricept [donepezil hcl], and Catapres [clonidine hcl]  Family History  Problem Relation Age of Onset   Diabetes Mother     Social History Social History   Tobacco Use   Smoking status: Former   Smokeless tobacco: Former    Types: Nurse, children's Use: Never used  Substance Use Topics   Alcohol use: No   Drug use: No    Review of Systems  Unable to be accurately assessed due to patient's dementia ____________________________________________   PHYSICAL EXAM:  VITAL SIGNS: Vitals:   09/10/2020 2200 08/31/2020 2245  BP: 116/84 116/62  Pulse: (!) 135 (!) 131  Resp: 16 17  Temp: 98.4 F (36.9 C)   SpO2: 100% 100%     Constitutional: Alert .  Sitting upright in bed, tachypneic with audible wheezes.  No distress. Eyes: Conjunctivae are normal. PERRL. EOMI. Head: Atraumatic. Nose: No congestion/rhinnorhea. Mouth/Throat: Mucous membranes are dry.  Oropharynx non-erythematous. Neck: No stridor. No cervical spine tenderness to palpation. Cardiovascular: Tachycardic and irregular rhythm. Grossly normal heart sounds.  Good peripheral circulation. Respiratory: Tachypneic to the mid 20s, no further evidence of distress.  No retractions.  Coarse bibasilar breath sounds and diffuse wheezing throughout with slight decrease in air movement throughout. Gastrointestinal: Soft , nondistended, nontender to palpation. No CVA tenderness. Musculoskeletal: .  No joint effusions. No signs of acute trauma. Pitting edema symmetrically to bilateral lower extremities up to the knees Neurologic: No gross focal neurologic deficits are appreciated.  Skin:  Skin is warm, dry and intact. No rash noted. Psychiatric: Mood and affect are difficult to assess  ____________________________________________   LABS (all labs ordered are listed, but  only abnormal results are displayed)  Labs Reviewed  RESP PANEL BY RT-PCR (FLU A&B, COVID) ARPGX2 - Abnormal; Notable for the following components:      Result Value   SARS Coronavirus 2 by RT PCR POSITIVE (*)    All other components within normal limits  COMPREHENSIVE METABOLIC PANEL - Abnormal; Notable for the following components:   Potassium 3.2 (*)    Chloride 94 (*)    Glucose, Bld 148 (*)    BUN 69 (*)    Creatinine, Ser 1.75 (*)    Calcium 8.4 (*)    Albumin 3.2 (*)    GFR, Estimated 39 (*)    All other components within normal limits  CBC WITH DIFFERENTIAL/PLATELET - Abnormal; Notable for the following components:   WBC 3.5 (*)    RBC 2.49 (*)    Hemoglobin 7.0 (*)    HCT 22.9 (*)    RDW 15.9 (*)    Platelets 131 (*)    All other components within normal limits  PROTIME-INR - Abnormal; Notable for the following components:   Prothrombin Time 20.2 (*)    INR 1.7 (*)    All other components within normal limits  APTT - Abnormal; Notable for the following components:   aPTT 46 (*)    All other components within normal limits  BRAIN NATRIURETIC PEPTIDE - Abnormal; Notable for the following components:   B Natriuretic Peptide 288.7 (*)    All other components within normal limits  URINE CULTURE  CULTURE, BLOOD (ROUTINE X 2)  CULTURE, BLOOD (ROUTINE X 2)  LACTIC ACID, PLASMA  LACTIC ACID, PLASMA  URINALYSIS, COMPLETE (UACMP) WITH MICROSCOPIC   ____________________________________________  12 Lead EKG  A. fib with RVR, rate of 122 bpm.  Normal axis.  Right bundle.  No STEMI. ____________________________________________  RADIOLOGY  ED MD interpretation: 1 view CXR reviewed by me with cardiomegaly and pulmonary vascular congestion without clear infiltration and pleural effusions.  Official radiology report(s): DG Chest Port 1 View  Result Date: 09/03/2020 CLINICAL DATA:  CHF and COPD. EXAM: PORTABLE CHEST 1 VIEW COMPARISON:  Chest radiograph dated 07/16/2020.  FINDINGS: There is cardiomegaly with central vascular congestion. No focal consolidation, pleural effusion, or pneumothorax. Atherosclerotic calcification of the aorta. No acute pathology. IMPRESSION: Cardiomegaly with central vascular congestion similar to prior radiograph. Electronically Signed   By: Anner Crete M.D.   On: 09/11/2020 21:26  ____________________________________________   PROCEDURES and INTERVENTIONS  Procedure(s) performed (including Critical Care):  .1-3 Lead EKG Interpretation  Date/Time: 08/27/2020 11:28 PM Performed by: Vladimir Crofts, MD Authorized by: Vladimir Crofts, MD     Interpretation: abnormal     ECG rate:  131   ECG rate assessment: tachycardic     Rhythm: atrial fibrillation     Ectopy: none     Conduction: normal   .Critical Care  Date/Time: 08/24/2020 11:28 PM Performed by: Vladimir Crofts, MD Authorized by: Vladimir Crofts, MD   Critical care provider statement:    Critical care time (minutes):  45   Critical care was necessary to treat or prevent imminent or life-threatening deterioration of the following conditions:  Cardiac failure and respiratory failure   Critical care was time spent personally by me on the following activities:  Discussions with consultants, evaluation of patient's response to treatment, examination of patient, ordering and performing treatments and interventions, ordering and review of laboratory studies, ordering and review of radiographic studies, pulse oximetry, re-evaluation of patient's condition, obtaining history from patient or surrogate and review of old charts  Medications  diltiazem (CARDIZEM) 125 mg in dextrose 5% 125 mL (1 mg/mL) infusion (5 mg/hr Intravenous New Bag/Given 09/12/2020 2244)  ipratropium-albuterol (DUONEB) 0.5-2.5 (3) MG/3ML nebulizer solution 6 mL (6 mLs Nebulization Given 09/01/2020 2132)  methylPREDNISolone sodium succinate (SOLU-MEDROL) 125 mg/2 mL injection 125 mg (125 mg Intravenous Given 09/22/2020  2131)  diltiazem (CARDIZEM) injection 10 mg (10 mg Intravenous Given 09/01/2020 2240)  furosemide (LASIX) injection 60 mg (60 mg Intravenous Given 08/29/2020 2240)    ____________________________________________   MDM / ED COURSE   80 year old male presents to the ED short of breath with evidence of CHF and COPD exacerbations, as well as A. fib with RVR, precipitated by acute COVID-19 and requiring medical admission.  He is tachycardic in A. fib with RVR, but he medically stable and not hypoxic on his home O2.  Exam with evidence of volume overload and COPD exacerbations.  No evidence of CAP or sepsis.  He is COVID-positive.  Started diuresis with 60 mg of IV Lasix, provided steroids and breathing treatments for his COPD and COVID, as well as initiating diltiazem bolus and drip to improve rate control for his A. fib with RVR.  We will admit to medicine for further work-up and management.  Clinical Course as of 09/10/2020 2329  Nancy Fetter Sep 20, 2020  2145 Son called and provided some history.  [DS]    Clinical Course User Index [DS] Vladimir Crofts, MD    ____________________________________________   FINAL CLINICAL IMPRESSION(S) / ED DIAGNOSES  Final diagnoses:  Atrial fibrillation with RVR ([place])  COVID-19  SOB (shortness of breath)  COPD exacerbation ([place])  Acute on chronic diastolic congestive heart failure [facility])     ED Discharge Orders     None        Tierany Appleby   Note:  This document was prepared using Dragon voice recognition software and may include unintentional dictation errors.    Vladimir Crofts, MD 09/19/2020 [phone]

## 2020-09-20 NOTE — H&P (Signed)
[place]   PATIENT NAME: Derek Shelton    MR#:  [id number]  DATE OF BIRTH:  06-29-1940  DATE OF ADMISSION:  09/04/2020  PRIMARY CARE PHYSICIAN: Derek Lighter, MD   Patient is coming from: Home   REQUESTING/REFERRING PHYSICIAN: Vladimir Crofts, MD  CHIEF COMPLAINT:   Chief Complaint  Patient presents with   Shortness of Breath  The patient is Derek Shelton speaking only and translation was provided by his son who is a Marine scientist.  HISTORY OF PRESENT ILLNESS:  Derek Shelton is a 80 y.o. Derek Shelton male with medical history significant for asthma, COPD, CHF, coronary artery disease, type 2 diabetes mellitus, hypertension, dyslipidemia and hypothyroidism, who presented to the emergency room after having a positive COVID with home antigen test.  The patient has been having dyspnea with associated productive cough and wheezing lately with significant diminished appetite and tachycardia at 130-140 today per his son who provides the history over the phone.  He has been having nausea without vomiting or diarrhea.  No loss of taste or smell.  No dysuria, oliguria, urinary frequency or urgency or flank pain.  No chest pain or palpitations.  No bleeding diathesis.  ED Course: When he came to the ER heart rate was 120 and later 135 with respiratory to 28 and pulse oximetry of 88% on room air and her percent on 2 L of O2 by nasal cannula which is his baseline with otherwise normal vital signs.  Labs revealed hypokalemia of 3.2 and a BUN of 69 with creatinine 1.75 close to baseline with hypochloremia of 94 and albumin of 3.2.  BNP was 288.7 and CBC showed anemia close to his baseline as well as leukopenia and thrombocytopenia.  Influenza antigens came back negative and COVID-19 PCR came back positive.  INR is 1.7 and PT 20.2.  EKG as reviewed by me : showed atrial fibrillation with rapid ventricular sponsor of 122 with right bundle branch block Imaging: Portable chest ray showed cardiomegaly with  central vascular congestion similar to prior radiograph.  The patient was given 60 mg IV Lasix, 10 mg of IV Cardizem followed by IV Cardizem drip as well as DuoNeb's and 125 mg of IV Solu-Medrol.  He will be admitted to progressive unit bed for further evaluation and management PAST MEDICAL HISTORY:   Past Medical History:  Diagnosis Date   Anemia    Asthma    CHF (congestive heart failure) (HCC)    COPD (chronic obstructive pulmonary disease) ([place])    Coronary artery disease    Dementia ([place])    Per son's report   Diabetes mellitus without complication ([place])    Dysrhythmia    Atrial Fibrillation   GERD (gastroesophageal reflux disease)    GI bleed    Hyperlipidemia    Hypertension    Hypothyroidism    Shortness of breath dyspnea    Sleep apnea    Stroke [facility])    Thyroid disease     PAST SURGICAL HISTORY:   Past Surgical History:  Procedure Laterality Date   CARDIAC CATHETERIZATION     COLONOSCOPY N/A 08/10/2014   Procedure: COLONOSCOPY;  Surgeon: Manya Silvas, MD;  Location: [facility] ENDOSCOPY;  Service: Endoscopy;  Laterality: N/A;   COLONOSCOPY WITH PROPOFOL N/A 04/05/2016   Procedure: COLONOSCOPY WITH PROPOFOL;  Surgeon: Lollie Sails, MD;  Location: [facility] ENDOSCOPY;  Service: Endoscopy;  Laterality: N/A;   COLONOSCOPY WITH PROPOFOL N/A 07/10/2019   Procedure: COLONOSCOPY WITH PROPOFOL;  Surgeon: Lucilla Lame,  MD;  Location: [facility] ENDOSCOPY;  Service: Endoscopy;  Laterality: N/A;   ESOPHAGOGASTRODUODENOSCOPY N/A 08/08/2014   Procedure: ESOPHAGOGASTRODUODENOSCOPY (EGD);  Surgeon: Manya Silvas, MD;  Location: [facility] ENDOSCOPY;  Service: Endoscopy;  Laterality: N/A;  plan for early afternoon   ESOPHAGOGASTRODUODENOSCOPY (EGD) WITH PROPOFOL N/A 07/10/2019   Procedure: ESOPHAGOGASTRODUODENOSCOPY (EGD) WITH PROPOFOL;  Surgeon: Lucilla Lame, MD;  Location: [facility] ENDOSCOPY;  Service: Endoscopy;  Laterality: N/A;   EYE SURGERY     GIVENS CAPSULE STUDY N/A 08/12/2014   Procedure:  GIVENS CAPSULE STUDY;  Surgeon: Manya Silvas, MD;  Location: [facility] ENDOSCOPY;  Service: Endoscopy;  Laterality: N/A;   rectal fistula N/A     SOCIAL HISTORY:   Social History   Tobacco Use   Smoking status: Former   Smokeless tobacco: Former    Types: Chew  Substance Use Topics   Alcohol use: No    FAMILY HISTORY:   Family History  Problem Relation Age of Onset   Diabetes Mother     DRUG ALLERGIES:   Allergies  Allergen Reactions   Carvedilol Shortness Of Breath    Other reaction(s): Asthma, Shortness of Breath   Penicillins Shortness Of Breath    Other reaction(s): Other (See Comments) Other reaction(s): RASH Other reaction(s): RASH Other reaction(s): RASH    Aspirin     Other reaction(s): Other (See Comments) On eliquis already- high risk of bleeding   Aricept [Donepezil Hcl] Other (See Comments)    Medication cause significant bradycardia   Catapres [Clonidine Hcl]     REVIEW OF SYSTEMS:   ROS As per history of present illness. All pertinent systems were reviewed above. Constitutional, HEENT, cardiovascular, respiratory, GI, GU, musculoskeletal, neuro, psychiatric, endocrine, integumentary and hematologic systems were reviewed and are otherwise negative/unremarkable except for positive findings mentioned above in the HPI.   MEDICATIONS AT HOME:   Prior to Admission medications   Medication Sig Start Date End Date Taking? Authorizing Provider  ACCU-CHEK GUIDE test strip E11.9   TO CHECK BLOOD GLUCOSE TWICE A DAY 08/06/19  Yes [provider]  apixaban (ELIQUIS) 2.5 MG TABS tablet Take 5 mg by mouth 2 (two) times daily.   Yes [provider]  atorvastatin (LIPITOR) 40 MG tablet Take 1 tablet (40 mg total) by mouth daily at 6 PM. 12/03/17  Yes Demetrios Loll, MD  BD PEN NEEDLE NANO 2ND GEN 32G X 4 MM MISC USE NIGHTLY WITH TRESIBA 08/28/19  Yes [provider]  budesonide-formoterol (SYMBICORT) 160-4.5 MCG/ACT inhaler Inhale 2 puffs into  the lungs 2 (two) times daily.   Yes [provider]  diltiazem (CARDIZEM CD) 180 MG 24 hr capsule Take 1 capsule (180 mg total) by mouth daily. 01/21/20 01/20/21 Yes Jennye Boroughs, MD  donepezil (ARICEPT) 5 MG tablet Take 5 mg by mouth at bedtime.   Yes [provider]  DULoxetine (CYMBALTA) 60 MG capsule Take 60 mg by mouth daily.   Yes [provider]  ergocalciferol (VITAMIN D2) 1.25 MG (50000 UT) capsule Take 50,000 Units by mouth once a week.   Yes [provider]  esomeprazole (NEXIUM) 40 MG capsule Take 40 mg by mouth daily at 12 noon.   Yes [provider]  ferrous sulfate 325 (65 FE) MG tablet Take 325 mg by mouth daily. 12/07/19  Yes [provider]  finasteride (PROSCAR) 5 MG tablet Take 5 mg by mouth daily.   Yes [provider]  formoterol (PERFOROMIST) 20 MCG/2ML nebulizer solution Inhale 2 mLs into the  lungs 2 (two) times daily. 12/16/19 12/15/20 Yes [provider]  insulin degludec (TRESIBA) 100 UNIT/ML FlexTouch Pen Inject 30 Units into the skin at bedtime.   Yes [provider]  ipratropium-albuterol (DUONEB) 0.5-2.5 (3) MG/3ML SOLN Inhale 3 mLs into the lungs every 6 (six) hours as needed for shortness of breath. 01/22/17  Yes [provider]  latanoprost (XALATAN) 0.005 % ophthalmic solution latanoprost 0.005 % eye drops  INSTILL ONE DROP TO BOTH EYES AT BEDTIME.   Yes [provider]  levothyroxine (SYNTHROID) 88 MCG tablet Take 88 mcg by mouth every morning. 07/08/20  Yes [provider]  loratadine (CLARITIN) 10 MG tablet Take 10 mg by mouth daily. 12/17/19  Yes [provider]  Melatonin 3 MG TABS Take 3 mg by mouth at bedtime as needed (sleep).    Yes [provider]  metolazone (ZAROXOLYN) 2.5 MG tablet Take 1 tablet (2.5 mg total) by mouth daily for 5 days. Patient taking differently: Take 2.5 mg by mouth as directed. Take on weds and Sundays  02/07/20 08/31/2020 Yes Bradler, Vista Lawman, MD  metoprolol tartrate (LOPRESSOR) 50 MG tablet Take 1 tablet (50 mg total) by mouth 2 (two) times daily. Patient taking differently: Take 25 mg by mouth 2 (two) times daily. 01/21/20  Yes Jennye Boroughs, MD  NEURONTIN 100 MG capsule Take 100 mg by mouth 2 (two) times daily. 07/09/20  Yes [provider]  omega-3 acid ethyl esters (LOVAZA) 1 G capsule Take 1 g by mouth daily.    Yes [provider]  pantoprazole (PROTONIX) 40 MG tablet Take 40 mg by mouth daily. 06/04/19  Yes [provider]  Potassium Chloride ER 20 MEQ TBCR Take 1 tablet by mouth daily. 06/11/20  Yes [provider]  sildenafil (REVATIO) 20 MG tablet Take 20 mg by mouth 3 (three) times daily. 05/09/19  Yes [provider]  tamsulosin (FLOMAX) 0.4 MG CAPS capsule Take 0.4 mg by mouth daily. 05/07/19  Yes [provider]  tiotropium (SPIRIVA) 18 MCG inhalation capsule Place 18 mcg into inhaler and inhale daily.   Yes [provider]  torsemide (DEMADEX) 20 MG tablet Take 1 tablet (20 mg total) by mouth daily as needed (for swelling). 01/21/20  Yes Jennye Boroughs, MD  budesonide (PULMICORT) 0.5 MG/2ML nebulizer solution Inhale 0.5 mg into the lungs 2 (two) times daily. For 10 days Patient not taking: Reported on 08/27/2020 12/11/19   [provider]  predniSONE (DELTASONE) 20 MG tablet Take 2 tablets (40 mg total) by mouth daily with breakfast. Patient not taking: No sig reported 07/18/20   Wouk, Ailene Rud, MD      VITAL SIGNS:  Blood pressure 116/62, pulse (!) 131, temperature 98.4 F (36.9 C), temperature source Axillary, resp. rate 17, weight 90 kg, SpO2 100 %.  PHYSICAL EXAMINATION:  Physical Exam  GENERAL:  80 y.o.-year-old Derek Shelton male patient lying in the bed with mild respiratory distress.   EYES: Pupils equal, round, reactive to light and accommodation. No scleral icterus. Extraocular muscles intact.  HEENT:  Head atraumatic, normocephalic. Oropharynx and nasopharynx clear.  NECK:  Supple, no jugular venous distention. No thyroid enlargement, no tenderness.  LUNGS: Diminished bibasilar breath sounds with bibasal rales with scattered expiratory wheezes and diminished expiratory airflow. CARDIOVASCULAR: Regular rate and rhythm, S1, S2 normal. No murmurs, rubs, or gallops.  ABDOMEN: Soft, nondistended, nontender. Bowel sounds present. No organomegaly or mass.  EXTREMITIES: 2+ bilateral lower extremity pitting edema with no cyanosis, or  clubbing.  NEUROLOGIC: Cranial nerves II through XII are intact. Muscle strength 5/5 in all extremities. Sensation intact. Gait not checked.  PSYCHIATRIC: The patient is alert and oriented x 3.  Normal affect and good eye contact. SKIN: No obvious rash, lesion, or ulcer.   LABORATORY PANEL:   CBC Recent Labs  Lab 08/31/2020 2128  WBC 3.5*  HGB 7.0*  HCT 22.9*  PLT 131*   ------------------------------------------------------------------------------------------------------------------  Chemistries  Recent Labs  Lab 08/28/2020 2128  NA 135  K 3.2*  CL 94*  CO2 32  GLUCOSE 148*  BUN 69*  CREATININE 1.75*  CALCIUM 8.4*  AST 35  ALT 20  ALKPHOS 73  BILITOT 0.8   ------------------------------------------------------------------------------------------------------------------  Cardiac Enzymes No results for input(s): TROPONINI in the last 168 hours. ------------------------------------------------------------------------------------------------------------------  RADIOLOGY:  DG Chest Port 1 View  Result Date: 08/28/2020 CLINICAL DATA:  CHF and COPD. EXAM: PORTABLE CHEST 1 VIEW COMPARISON:  Chest radiograph dated 07/16/2020. FINDINGS: There is cardiomegaly with central vascular congestion. No focal consolidation, pleural effusion, or pneumothorax. Atherosclerotic calcification of the aorta. No acute pathology. IMPRESSION: Cardiomegaly with central  vascular congestion similar to prior radiograph. Electronically Signed   By: Anner Crete M.D.   On: 09/03/2020 21:26      IMPRESSION AND PLAN:  Active Problems:   * No active hospital problems. *  1.  Acute on chronic diastolic CHF.  Most recent EF more than 55% in June 2020 with moderate severe tricuspid regurgitation and moderate pulmonary hypertension. - The patient will be admitted to a progressive unit bed. - We will continue diuresis with IV Lasix. - We will follow serial troponins.  2.  Atrial fibrillation with Ventricular sponsor. - We will continue IV Cardizem drip. - Cardiology consult to be obtained. - I notified Dr. Corky Sox about the patient. - We will optimize potassium and magnesium. - We will continue his Eliquis  3.  COPD exacerbation. - The patient will be placed on scheduled Combivent and steroid therapy with IV Solu-Medrol. - We will place him on IV Rocephin given productive cough. - Mucolytic therapy will be provided. - We will check sputum culture. - We will hold off Symbicort.  4.  COVID-19 infection possibly contributing to COPD exacerbation. - The patient will be placed on IV remdesivir. - He will be on vitamin D3, vitamin C and zinc sulfate. - We will follow inflammatory markers.  5.  Hypokalemia. - Potassium will be replaced and magnesium level will be checked.  6.  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 - The patient will be placed on supplement coverage with NovoLog. - We will continue his basal coverage. - We will continue Neurontin.  7.  Dyslipidemia. - We will continue statin therapy.  8.  Dementia. - We will continue Aricept.  DVT prophylaxis: Eliquis.  Code Status: full code. Family Communication:  The plan of care was discussed in details with the patient (and family). I answered all questions. The patient agreed to proceed with the above mentioned plan. Further management will depend upon hospital  course. Disposition Plan: Back to previous home environment Consults called: Cardiology. All the records are reviewed and case discussed with ED provider.  Status is: Inpatient  Remains inpatient appropriate because:Ongoing diagnostic testing needed not appropriate for outpatient work up, Unsafe d/c plan, IV treatments appropriate due to intensity of illness or inability to take PO, and Inpatient level of care appropriate due to severity of illness  Dispo: The patient is from: Home  Anticipated d/c is to: Home              Patient currently is not medically stable to d/c.   Difficult to place patient No   TOTAL TIME TAKING CARE OF THIS PATIENT: 55 minutes.    Christel Mormon M.D on 08/27/2020 at 11:56 PM  [facility]   From 7 PM-7 AM, contact night-coverage [email or web address]  CC: Primary care physician; Derek Lighter, MD

## 2020-09-21 DIAGNOSIS — J9611 Chronic respiratory failure with hypoxia: Secondary | ICD-10-CM

## 2020-09-21 LAB — BASIC METABOLIC PANEL
Anion gap: 11 (ref 5–15)
BUN: 69 mg/dL — ABNORMAL HIGH (ref 8–23)
CO2: 30 mmol/L (ref 22–32)
Calcium: 8.3 mg/dL — ABNORMAL LOW (ref 8.9–10.3)
Chloride: 92 mmol/L — ABNORMAL LOW (ref 98–111)
Creatinine, Ser: 1.69 mg/dL — ABNORMAL HIGH (ref 0.61–1.24)
GFR, Estimated: 40 mL/min — ABNORMAL LOW (ref >60)
Glucose, Bld: 249 mg/dL — ABNORMAL HIGH (ref 70–99)
Potassium: 3 mmol/L — ABNORMAL LOW (ref 3.5–5.1)
Sodium: 133 mmol/L — ABNORMAL LOW (ref 135–145)

## 2020-09-21 LAB — BLOOD GAS, VENOUS
Acid-Base Excess: 6.6 mmol/L — ABNORMAL HIGH (ref 0.0–2.0)
Bicarbonate: 31.8 mmol/L — ABNORMAL HIGH (ref 20.0–28.0)
O2 Saturation: 96.2 %
Patient temperature: 37
pCO2, Ven: 49 mmHg (ref 44.0–60.0)
pH, Ven: 7.42 (ref 7.250–7.430)
pO2, Ven: 82 mmHg — ABNORMAL HIGH (ref 32.0–45.0)

## 2020-09-21 LAB — URINALYSIS, COMPLETE (UACMP) WITH MICROSCOPIC
Bilirubin Urine: NEGATIVE
Glucose, UA: NEGATIVE mg/dL
Hgb urine dipstick: NEGATIVE
Ketones, ur: NEGATIVE mg/dL
Leukocytes,Ua: NEGATIVE
Nitrite: NEGATIVE
Protein, ur: NEGATIVE mg/dL
Specific Gravity, Urine: 1.015 (ref 1.005–1.030)
Squamous Epithelial / HPF: NONE SEEN (ref 0–5)
pH: 5 (ref 5.0–8.0)

## 2020-09-21 LAB — CBC
HCT: 23 % — ABNORMAL LOW (ref 39.0–52.0)
Hemoglobin: 7 g/dL — ABNORMAL LOW (ref 13.0–17.0)
MCH: 28.1 pg (ref 26.0–34.0)
MCHC: 30.4 g/dL (ref 30.0–36.0)
MCV: 92.4 fL (ref 80.0–100.0)
Platelets: 128 10*3/uL — ABNORMAL LOW (ref 150–400)
RBC: 2.49 MIL/uL — ABNORMAL LOW (ref 4.22–5.81)
RDW: 15.6 % — ABNORMAL HIGH (ref 11.5–15.5)
WBC: 3.4 10*3/uL — ABNORMAL LOW (ref 4.0–10.5)
nRBC: 0 % (ref 0.0–0.2)

## 2020-09-21 LAB — D-DIMER, QUANTITATIVE: D-Dimer, Quant: 0.27 ug/mL-FEU (ref 0.00–0.50)

## 2020-09-21 LAB — MAGNESIUM: Magnesium: 2.3 mg/dL (ref 1.7–2.4)

## 2020-09-21 LAB — C-REACTIVE PROTEIN: CRP: 0.7 mg/dL (ref <1.0)

## 2020-09-21 LAB — FIBRINOGEN: Fibrinogen: 322 mg/dL (ref 210–475)

## 2020-09-21 LAB — FERRITIN: Ferritin: 22 ng/mL — ABNORMAL LOW (ref 24–336)

## 2020-09-21 LAB — GLUCOSE, CAPILLARY: Glucose-Capillary: 271 mg/dL — ABNORMAL HIGH (ref 70–99)

## 2020-09-21 LAB — LACTATE DEHYDROGENASE: LDH: 221 U/L — ABNORMAL HIGH (ref 98–192)

## 2020-09-21 LAB — PROCALCITONIN: Procalcitonin: <0.1 ng/mL

## 2020-09-21 LAB — LACTIC ACID, PLASMA: Lactic Acid, Venous: 1.2 mmol/L (ref 0.5–1.9)

## 2020-09-21 MED ORDER — DONEPEZIL HCL 5 MG PO TABS
5.0000 mg | ORAL_TABLET | Freq: Every day | ORAL | Status: DC
  Administered 2020-09-21 – 2020-09-28 (×8): 5 mg via ORAL
  Filled 2020-09-21 (×10): qty 1

## 2020-09-21 MED ORDER — ONDANSETRON HCL 4 MG/2ML IJ SOLN: 4.0000 mg | Freq: Four times a day (QID) | INTRAMUSCULAR | Status: DC | PRN

## 2020-09-21 MED ORDER — FUROSEMIDE 10 MG/ML IJ SOLN
40.0000 mg | Freq: Two times a day (BID) | INTRAMUSCULAR | Status: DC
  Administered 2020-09-21 – 2020-09-22 (×3): 40 mg via INTRAVENOUS
  Filled 2020-09-21 (×3): qty 4

## 2020-09-21 MED ORDER — ATORVASTATIN CALCIUM 20 MG PO TABS
40.0000 mg | ORAL_TABLET | Freq: Every day | ORAL | Status: DC
  Administered 2020-09-21 – 2020-09-28 (×7): 40 mg via ORAL
  Filled 2020-09-21 (×7): qty 2

## 2020-09-21 MED ORDER — ZINC SULFATE 220 (50 ZN) MG PO CAPS
220.0000 mg | ORAL_CAPSULE | Freq: Every day | ORAL | Status: DC
  Administered 2020-09-21 – 2020-09-29 (×8): 220 mg via ORAL
  Filled 2020-09-21 (×7): qty 1

## 2020-09-21 MED ORDER — LORATADINE 10 MG PO TABS
10.0000 mg | ORAL_TABLET | Freq: Every day | ORAL | Status: DC
  Administered 2020-09-21 – 2020-09-29 (×8): 10 mg via ORAL
  Filled 2020-09-21 (×8): qty 1

## 2020-09-21 MED ORDER — ENOXAPARIN SODIUM 40 MG/0.4ML IJ SOSY: 40.0000 mg | PREFILLED_SYRINGE | INTRAMUSCULAR | Status: DC

## 2020-09-21 MED ORDER — PANTOPRAZOLE SODIUM 40 MG PO TBEC: 40.0000 mg | DELAYED_RELEASE_TABLET | Freq: Every day | ORAL | Status: DC

## 2020-09-21 MED ORDER — SODIUM CHLORIDE 0.9 % IV SOLN
200.0000 mg | Freq: Once | INTRAVENOUS | Status: AC
  Administered 2020-09-21: 200 mg via INTRAVENOUS
  Filled 2020-09-21: qty 40

## 2020-09-21 MED ORDER — IPRATROPIUM-ALBUTEROL 20-100 MCG/ACT IN AERS
2.0000 | INHALATION_SPRAY | Freq: Four times a day (QID) | RESPIRATORY_TRACT | Status: DC
  Administered 2020-09-21 – 2020-09-29 (×29): 2 via RESPIRATORY_TRACT
  Filled 2020-09-21: qty 4

## 2020-09-21 MED ORDER — SODIUM CHLORIDE 0.9 % IV SOLN
100.0000 mg | Freq: Every day | INTRAVENOUS | Status: DC
  Administered 2020-09-22 – 2020-09-23 (×2): 100 mg via INTRAVENOUS
  Filled 2020-09-21 (×2): qty 100
  Filled 2020-09-21: qty 20

## 2020-09-21 MED ORDER — HYDROCOD POLST-CPM POLST ER 10-8 MG/5ML PO SUER: 5.0000 mL | Freq: Two times a day (BID) | ORAL | Status: DC | PRN

## 2020-09-21 MED ORDER — FINASTERIDE 5 MG PO TABS
5.0000 mg | ORAL_TABLET | Freq: Every day | ORAL | Status: DC
  Administered 2020-09-21 – 2020-09-29 (×8): 5 mg via ORAL
  Filled 2020-09-21 (×9): qty 1

## 2020-09-21 MED ORDER — TRAZODONE HCL 50 MG PO TABS: 25.0000 mg | ORAL_TABLET | Freq: Every evening | ORAL | Status: DC | PRN

## 2020-09-21 MED ORDER — LATANOPROST 0.005 % OP SOLN
1.0000 [drp] | Freq: Every day | OPHTHALMIC | Status: DC
  Administered 2020-09-21 – 2020-09-28 (×8): 1 [drp] via OPHTHALMIC
  Filled 2020-09-21: qty 2.5

## 2020-09-21 MED ORDER — APIXABAN 2.5 MG PO TABS
2.5000 mg | ORAL_TABLET | Freq: Two times a day (BID) | ORAL | Status: DC
  Administered 2020-09-21 – 2020-09-24 (×7): 2.5 mg via ORAL
  Filled 2020-09-21 (×7): qty 1

## 2020-09-21 MED ORDER — ONDANSETRON HCL 4 MG PO TABS: 4.0000 mg | ORAL_TABLET | Freq: Four times a day (QID) | ORAL | Status: DC | PRN

## 2020-09-21 MED ORDER — PANTOPRAZOLE SODIUM 40 MG PO TBEC
40.0000 mg | DELAYED_RELEASE_TABLET | Freq: Every day | ORAL | Status: DC
  Administered 2020-09-21 – 2020-09-29 (×8): 40 mg via ORAL
  Filled 2020-09-21 (×7): qty 1

## 2020-09-21 MED ORDER — OMEGA-3-ACID ETHYL ESTERS 1 G PO CAPS
1.0000 g | ORAL_CAPSULE | Freq: Every day | ORAL | Status: DC
  Administered 2020-09-21 – 2020-09-29 (×6): 1 g via ORAL
  Filled 2020-09-21 (×8): qty 1

## 2020-09-21 MED ORDER — DILTIAZEM HCL ER COATED BEADS 180 MG PO CP24: 180.0000 mg | ORAL_CAPSULE | Freq: Every day | ORAL | Status: DC

## 2020-09-21 MED ORDER — MELATONIN 5 MG PO TABS: 2.5000 mg | ORAL_TABLET | Freq: Every evening | ORAL | Status: DC | PRN

## 2020-09-21 MED ORDER — VITAMIN D (ERGOCALCIFEROL) 1.25 MG (50000 UNIT) PO CAPS
50000.0000 [IU] | ORAL_CAPSULE | ORAL | Status: DC
  Administered 2020-09-21 – 2020-09-28 (×2): 50000 [IU] via ORAL
  Filled 2020-09-21 (×2): qty 1

## 2020-09-21 MED ORDER — LEVOTHYROXINE SODIUM 88 MCG PO TABS
88.0000 ug | ORAL_TABLET | Freq: Every day | ORAL | Status: DC
  Administered 2020-09-21 – 2020-09-29 (×8): 88 ug via ORAL
  Filled 2020-09-21 (×11): qty 1

## 2020-09-21 MED ORDER — MOMETASONE FURO-FORMOTEROL FUM 200-5 MCG/ACT IN AERO
2.0000 | INHALATION_SPRAY | Freq: Two times a day (BID) | RESPIRATORY_TRACT | Status: DC
  Administered 2020-09-21 – 2020-09-29 (×14): 2 via RESPIRATORY_TRACT
  Filled 2020-09-21: qty 8.8

## 2020-09-21 MED ORDER — METHYLPREDNISOLONE SODIUM SUCC 40 MG IJ SOLR
40.0000 mg | Freq: Three times a day (TID) | INTRAMUSCULAR | Status: DC
  Administered 2020-09-21 – 2020-09-23 (×7): 40 mg via INTRAVENOUS
  Filled 2020-09-21 (×7): qty 1

## 2020-09-21 MED ORDER — IPRATROPIUM-ALBUTEROL 0.5-2.5 (3) MG/3ML IN SOLN
3.0000 mL | Freq: Four times a day (QID) | RESPIRATORY_TRACT | Status: DC | PRN
  Administered 2020-09-21: 3 mL via RESPIRATORY_TRACT
  Filled 2020-09-21: qty 3

## 2020-09-21 MED ORDER — DULOXETINE HCL 30 MG PO CPEP
60.0000 mg | ORAL_CAPSULE | Freq: Every day | ORAL | Status: DC
  Administered 2020-09-21 – 2020-09-29 (×8): 60 mg via ORAL
  Filled 2020-09-21 (×6): qty 2
  Filled 2020-09-21: qty 1
  Filled 2020-09-21: qty 2

## 2020-09-21 MED ORDER — VITAMIN D 25 MCG (1000 UNIT) PO TABS
1000.0000 [IU] | ORAL_TABLET | Freq: Every day | ORAL | Status: DC
  Administered 2020-09-21 – 2020-09-29 (×8): 1000 [IU] via ORAL
  Filled 2020-09-21 (×8): qty 1

## 2020-09-21 MED ORDER — GUAIFENESIN ER 600 MG PO TB12
600.0000 mg | ORAL_TABLET | Freq: Two times a day (BID) | ORAL | Status: DC
  Administered 2020-09-21 – 2020-09-26 (×10): 600 mg via ORAL
  Filled 2020-09-21 (×10): qty 1

## 2020-09-21 MED ORDER — TIOTROPIUM BROMIDE MONOHYDRATE 18 MCG IN CAPS
18.0000 ug | ORAL_CAPSULE | Freq: Every day | RESPIRATORY_TRACT | Status: DC
  Administered 2020-09-21 – 2020-09-29 (×6): 18 ug via RESPIRATORY_TRACT
  Filled 2020-09-21 (×2): qty 5

## 2020-09-21 MED ORDER — BUDESONIDE 0.5 MG/2ML IN SUSP: 0.5000 mg | Freq: Two times a day (BID) | RESPIRATORY_TRACT | Status: DC

## 2020-09-21 MED ORDER — SILDENAFIL CITRATE 20 MG PO TABS
20.0000 mg | ORAL_TABLET | Freq: Three times a day (TID) | ORAL | Status: DC
  Administered 2020-09-21 – 2020-09-29 (×21): 20 mg via ORAL
  Filled 2020-09-21 (×30): qty 1

## 2020-09-21 MED ORDER — PREDNISONE 20 MG PO TABS: 40.0000 mg | ORAL_TABLET | Freq: Every day | ORAL | Status: DC

## 2020-09-21 MED ORDER — ACETAMINOPHEN 650 MG RE SUPP: 650.0000 mg | Freq: Four times a day (QID) | RECTAL | Status: DC | PRN

## 2020-09-21 MED ORDER — POTASSIUM CHLORIDE CRYS ER 20 MEQ PO TBCR: EXTENDED_RELEASE_TABLET | Freq: Every day | ORAL | Status: DC

## 2020-09-21 MED ORDER — GUAIFENESIN-DM 100-10 MG/5ML PO SYRP: 10.0000 mL | ORAL_SOLUTION | ORAL | Status: DC | PRN

## 2020-09-21 MED ORDER — TAMSULOSIN HCL 0.4 MG PO CAPS
0.4000 mg | ORAL_CAPSULE | Freq: Every day | ORAL | Status: DC
  Administered 2020-09-21 – 2020-09-29 (×8): 0.4 mg via ORAL
  Filled 2020-09-21 (×7): qty 1

## 2020-09-21 MED ORDER — POTASSIUM CHLORIDE CRYS ER 20 MEQ PO TBCR
20.0000 meq | EXTENDED_RELEASE_TABLET | Freq: Every day | ORAL | Status: DC
  Administered 2020-09-22: 20 meq via ORAL
  Filled 2020-09-21: qty 2
  Filled 2020-09-21: qty 1

## 2020-09-21 MED ORDER — GABAPENTIN 100 MG PO CAPS
100.0000 mg | ORAL_CAPSULE | Freq: Two times a day (BID) | ORAL | Status: DC
  Administered 2020-09-21 – 2020-09-24 (×6): 100 mg via ORAL
  Filled 2020-09-21 (×7): qty 1

## 2020-09-21 MED ORDER — ASCORBIC ACID 500 MG PO TABS
500.0000 mg | ORAL_TABLET | Freq: Every day | ORAL | Status: DC
  Administered 2020-09-21 – 2020-09-29 (×8): 500 mg via ORAL
  Filled 2020-09-21 (×8): qty 1

## 2020-09-21 MED ORDER — MAGNESIUM HYDROXIDE 400 MG/5ML PO SUSP: 30.0000 mL | Freq: Every day | ORAL | Status: DC | PRN

## 2020-09-21 MED ORDER — ACETAMINOPHEN 325 MG PO TABS
650.0000 mg | ORAL_TABLET | Freq: Four times a day (QID) | ORAL | Status: DC | PRN
  Administered 2020-09-22 – 2020-09-29 (×5): 650 mg via ORAL
  Filled 2020-09-21 (×7): qty 2

## 2020-09-21 MED ORDER — METOLAZONE 2.5 MG PO TABS
2.5000 mg | ORAL_TABLET | ORAL | Status: DC
  Filled 2020-09-21: qty 1

## 2020-09-21 MED ORDER — CEFTRIAXONE SODIUM 1 G IJ SOLR
1.0000 g | INTRAMUSCULAR | Status: DC
  Administered 2020-09-21: 1 g via INTRAVENOUS
  Filled 2020-09-21: qty 10

## 2020-09-21 MED ORDER — METOPROLOL TARTRATE 25 MG PO TABS
25.0000 mg | ORAL_TABLET | Freq: Two times a day (BID) | ORAL | Status: DC
  Administered 2020-09-21 – 2020-09-24 (×8): 25 mg via ORAL
  Filled 2020-09-21 (×7): qty 1

## 2020-09-21 MED ORDER — LEVALBUTEROL TARTRATE 45 MCG/ACT IN AERO
2.0000 | INHALATION_SPRAY | RESPIRATORY_TRACT | Status: DC | PRN
  Administered 2020-09-22 – 2020-09-28 (×5): 2 via RESPIRATORY_TRACT
  Filled 2020-09-21 (×2): qty 15

## 2020-09-21 MED ORDER — POTASSIUM CHLORIDE CRYS ER 20 MEQ PO TBCR
40.0000 meq | EXTENDED_RELEASE_TABLET | ORAL | Status: AC
  Administered 2020-09-21 (×2): 40 meq via ORAL
  Filled 2020-09-21 (×2): qty 2

## 2020-09-21 MED ORDER — INSULIN GLARGINE-YFGN 100 UNIT/ML ~~LOC~~ SOLN
30.0000 [IU] | Freq: Every day | SUBCUTANEOUS | Status: DC
  Administered 2020-09-21 – 2020-09-23 (×4): 30 [IU] via SUBCUTANEOUS
  Filled 2020-09-21 (×6): qty 0.3

## 2020-09-21 NOTE — Consult Note (Signed)
[facility] Cardiology  CARDIOLOGY CONSULT NOTE  Patient ID: Derek Shelton MRN: [id number] DOB/AGE: Jul 01, 1940 80 y.o.  Admit date: 09/21/2020 Referring Physician Arbutus Ped Primary Physician [facility] Primary Cardiologist Parasachos Reason for Consultation atrial fibrillation  HPI: 80 year old Derek Shelton male referred for evaluation for atrial fibrillation and chronic diastolic congestive heart failure.  The patient has a history of tachybradycardia syndrome with atrial fibrillation history of sinus bradycardia.  The patient has had recent history of productive cough, wheezing and shortness of breath with positive home COVID antigen test and was brought to [facility] ED.  In the emergency room, patient noted to be tachycardic.  ECG revealing atrial fibrillation with right bundle blanch block, at a rate of 122 bpm.  The patient noted to be hypoxic with oximetry of 88% on room air and was placed on oxygen 2 L by nasal cannula.  Admission labs were notable for potassium 3.2, BUN and creatinine of 69 and 1.7 5, respectively.  BNP was 288.7.  Patient was anemic with hemoglobin hematocrit of 7.0 and 23.0, respectively.  The patient was treated with diltiazem bolus and drip for atrial fibrillation with rapid ventricular rate.  He is receiving intravenous steroids.  He is also being treated with intravenous remdesivir for COVID-19.  Review of systems complete and found to be negative unless listed above     Past Medical History:  Diagnosis Date   Anemia    Asthma    CHF (congestive heart failure) (HCC)    COPD (chronic obstructive pulmonary disease) ([place])    Coronary artery disease    Dementia ([place])    Per son's report   Diabetes mellitus without complication ([place])    Dysrhythmia    Atrial Fibrillation   GERD (gastroesophageal reflux disease)    GI bleed    Hyperlipidemia    Hypertension    Hypothyroidism    Shortness of breath dyspnea    Sleep apnea    Stroke [facility])    Thyroid disease     Past  Surgical History:  Procedure Laterality Date   CARDIAC CATHETERIZATION     COLONOSCOPY N/A 08/10/2014   Procedure: COLONOSCOPY;  Surgeon: Manya Silvas, MD;  Location: [facility] ENDOSCOPY;  Service: Endoscopy;  Laterality: N/A;   COLONOSCOPY WITH PROPOFOL N/A 04/05/2016   Procedure: COLONOSCOPY WITH PROPOFOL;  Surgeon: Lollie Sails, MD;  Location: [facility] ENDOSCOPY;  Service: Endoscopy;  Laterality: N/A;   COLONOSCOPY WITH PROPOFOL N/A 07/10/2019   Procedure: COLONOSCOPY WITH PROPOFOL;  Surgeon: Lucilla Lame, MD;  Location: [facility] ENDOSCOPY;  Service: Endoscopy;  Laterality: N/A;   ESOPHAGOGASTRODUODENOSCOPY N/A 08/08/2014   Procedure: ESOPHAGOGASTRODUODENOSCOPY (EGD);  Surgeon: Manya Silvas, MD;  Location: [facility] ENDOSCOPY;  Service: Endoscopy;  Laterality: N/A;  plan for early afternoon   ESOPHAGOGASTRODUODENOSCOPY (EGD) WITH PROPOFOL N/A 07/10/2019   Procedure: ESOPHAGOGASTRODUODENOSCOPY (EGD) WITH PROPOFOL;  Surgeon: Lucilla Lame, MD;  Location[facility] ENDOSCOPY;  Service: Endoscopy;  Laterality: N/A;   EYE SURGERY     GIVENS CAPSULE STUDY N/A 08/12/2014   Procedure: GIVENS CAPSULE STUDY;  Surgeon: Manya Silvas, MD;  Location: [facility] ENDOSCOPY;  Service: Endoscopy;  Laterality: N/A;   rectal fistula N/A     (Not in a hospital admission)  Social History   Socioeconomic History   Marital status: Married    Spouse name: Not on file   Number of children: Not on file   Years of education: Not on file   Highest education level: Not on file  Occupational History   Not on file  Tobacco Use   Smoking status: Former   Smokeless tobacco: Former    Types: Nurse, children's Use: Never used  Substance and Sexual Activity   Alcohol use: No   Drug use: No   Sexual activity: Not on file  Other Topics Concern   Not on file  Social History Narrative   Not on file   Social Determinants of Health   Financial Resource Strain: Not on file  Food Insecurity: Not on file  Transportation  Needs: Not on file  Physical Activity: Not on file  Stress: Not on file  Social Connections: Not on file  Intimate Partner Violence: Not on file    Family History  Problem Relation Age of Onset   Diabetes Mother       Review of systems complete and found to be negative unless listed above      PHYSICAL EXAM  General: Well developed, well nourished, in no acute distress HEENT:  Normocephalic and atramatic Neck:  No JVD.  Lungs: Clear bilaterally to auscultation and percussion. Heart: HRRR . Normal S1 and S2 without gallops or murmurs.  Abdomen: Bowel sounds are positive, abdomen soft and non-tender  Msk:  Back normal, normal gait. Normal strength and tone for age. Extremities: No clubbing, cyanosis or edema.   Neuro: Alert and oriented X 3. Psych:  Good affect, responds appropriately  Labs:   Lab Results  Component Value Date   WBC 3.4 (L) 09/21/2020   HGB 7.0 (L) 09/21/2020   HCT 23.0 (L) 09/21/2020   MCV 92.4 09/21/2020   PLT 128 (L) 09/21/2020    Recent Labs  Lab 09/19/2020 2128 09/21/20 0344  NA 135 133*  K 3.2* 3.0*  CL 94* 92*  CO2 32 30  BUN 69* 69*  CREATININE 1.75* 1.69*  CALCIUM 8.4* 8.3*  PROT 6.9  --   BILITOT 0.8  --   ALKPHOS 73  --   ALT 20  --   AST 35  --   GLUCOSE 148* 249*   Lab Results  Component Value Date   CKTOTAL 133 07/15/2020   CKMB 11.9 (H) 05/07/2014   TROPONINI <0.03 12/02/2017    Lab Results  Component Value Date   CHOL 159 12/03/2017   Lab Results  Component Value Date   HDL 32 (L) 12/03/2017   Lab Results  Component Value Date   LDLCALC 110 (H) 12/03/2017   Lab Results  Component Value Date   TRIG 32 01/14/2020   TRIG 84 12/03/2017   Lab Results  Component Value Date   CHOLHDL 5.0 12/03/2017   No results found for: LDLDIRECT    Radiology: [facility] Chest Port 1 View  Result Date: 09/06/2020 CLINICAL DATA:  CHF and COPD. EXAM: PORTABLE CHEST 1 VIEW COMPARISON:  Chest radiograph dated 07/16/2020. FINDINGS:  There is cardiomegaly with central vascular congestion. No focal consolidation, pleural effusion, or pneumothorax. Atherosclerotic calcification of the aorta. No acute pathology. IMPRESSION: Cardiomegaly with central vascular congestion similar to prior radiograph. Electronically Signed   By: Anner Crete M.D.   On: 09/12/2020 21:26    EKG: Atrial fibrillation with right bundle branch block at a rate of 122 bpm  ASSESSMENT AND PLAN:   1.  Respiratory failure, multifactorial, secondary to acute on chronic diastolic congestive heart failure, COPD exacerbation, with underlying COVID-19 infection 2.  Atrial fibrillation with rapid ventricular rate, compensatory for respiratory failure, COVID-19 infection and anemia, on diltiazem drip for rate control, and Eliquis for  stroke prevention 3.  Chronic diastolic congestive heart failure, LVEF greater than 55% by 2D echocardiogram 04/29/2019 4.  COPD exacerbation 5.  COVID-19 infection, on IV remdesivir 6.  Chronic kidney disease, BUN and creatinine 16 and 1.75 with GFR 39 7.  Anemia, hemoglobin adequate 7.0 and 23.0, respectively 8.  Pulmonary hypertension, on sildenafil  Recommendations  1.  Agree with overall current therapy 2.  Continue diuresis 3.  Carefully monitor renal status (patient experienced dehydration previously from overdiuresis) 4.  Continue diltiazem drip for rate control (carefully monitor rate, patient previous experienced iatrogenically induced bradycardia) 5.  Continue Eliquis for stroke prevention 6.  Further recommendations pending patient's initial clinical course  Signed: Isaias Cowman MD,PhD, [facility] 09/21/2020, 9:33 AM

## 2020-09-21 NOTE — Progress Notes (Signed)
PHARMACIST - PHYSICIAN COMMUNICATION  CONCERNING:  Enoxaparin (Lovenox) for DVT Prophylaxis    RECOMMENDATION: Patient was prescribed enoxaprin '40mg'$  q24 hours for VTE prophylaxis.   Filed Weights   08/31/2020 2245  Weight: 90 kg (198 lb 6.6 oz)    Body mass index is 31.08 kg/m.  Estimated Creatinine Clearance: 35.4 mL/min (A) (by C-G formula based on SCr of 1.75 mg/dL (H)).   Based on [place] patient is candidate for enoxaparin 0.'5mg'$ /kg TBW SQ every 24 hours based on BMI being >30.  DESCRIPTION: Pharmacy has adjusted enoxaparin dose per [facility] policy.  Patient is now receiving enoxaparin 0.5 mg/kg every 24 hours   Renda Rolls, PharmD, [facility] 09/21/2020 12:51 AM

## 2020-09-21 NOTE — ED Notes (Signed)
This RN on phone with pt's son for interpretation

## 2020-09-21 NOTE — ED Notes (Signed)
Pt given meal tray.

## 2020-09-21 NOTE — Progress Notes (Signed)
Remdesivir - Pharmacy Brief Note   O:  CXR: "No focal consolidation, pleural effusion, or pneumothorax." SpO2: 88-100% on 2 L/min    A/P:  Remdesivir 200 mg IVPB once followed by 100 mg IVPB daily x 4 days.   Renda Rolls, PharmD, [facility] 09/21/2020 12:53 AM

## 2020-09-21 NOTE — Progress Notes (Addendum)
PROGRESS NOTE    Derek Shelton   [id number]  DOB: 1940-07-10  PCP: Derek Lighter, MD    DOA: 09/04/2020 LOS: 1    Brief Narrative / Hospital Course to Date:   HPI per H&P: "80 y.o. Derek Shelton male with medical history significant for asthma, COPD, CHF, coronary artery disease, type 2 diabetes mellitus, hypertension, dyslipidemia and hypothyroidism, who presented to the emergency room after having a positive COVID with home antigen test.  The patient has been having dyspnea with associated productive cough and wheezing lately with significant diminished appetite and tachycardia at 130-140 today per his son who provides the history over the phone.  He has been having nausea without vomiting..."   Patient found to be in A-fib with RVR.  Chest xray consistent with central vascular congestion.  Covid-19 PCR positive.  Requiring baseline 2 L/min oxygen.  Admitted to [facility] service with cardiology consulted. On Cardizem drip.   Assessment & Plan   Principal Problem:   COVID-19 virus infection Active Problems:   Atrial fibrillation with RVR (HCC)   Acute on chronic diastolic CHF (congestive heart failure) (HCC)   Bradycardia   Type 2 diabetes mellitus without complication (HCC)   Essential hypertension   PAH (pulmonary artery hypertension) (HCC)   Chronic kidney disease, stage 3a (HCC)   COPD (chronic obstructive pulmonary disease) (HCC)   Chronic respiratory failure with hypoxia (HCC)   OSA (obstructive sleep apnea)   Esophageal reflux   Spinal stenosis of lumbar region   BPH (benign prostatic hyperplasia)   Dementia without behavioral disturbance (HCC)   History of CVA (cerebrovascular accident)   Acute on chronic diastolic CHF A-fib with RVR Hx of Bradycardia, intermittent Management per Cardiology On Cardizem gtt, IV Lasix Monitor on telemetry Replace K and Mg as needed Follow BMP, Mg Continue Eliquis and Lopressor Last TSH high, recheck with AM  labs  Covid-19 infection COPD with acute exacerbation Chronic respiratory failure with hypoxia  on 2 L/min O2 at baseline Continue remdesivir and IV steroids. D/C Rocephin since procal negative; monitor clinically Follow sputum culture Mucolytics, bronchodilators Follow inflammatory markers, CMP, CBC Vitamins D3, C, Zinc Supplement O2 to keep sats at or above 90%  Pulmonary Hypertension - continue sildenafil, supplement oxygen.  Type 2 Diabetes- continue basal insulin and sliding scale Novolog.  Monitor for steroid-induced hyperglycemia. Titrate insulin (140-180 inpatient goal).  Peripheral neuropathy - continue gabapentin  OSA - noncompliant with CPAP  Chronic rhinitis - continue Claritin  Hyperlipidemia - on statin  GERD - continue PPI  Dementia - continue Aricept --Delirium precautions:     -Lights and TV off, minimize interruptions at night    -Blinds open and lights on during day    -Glasses/hearing aid with patient    -Frequent reorientation    -PT/OT when able    -Avoid sedation medications/Beers list medications  BPH- continue home Flomax and Proscar  Hypothyroidism - continue Synthroid. TSH in June was elevated at 7.182 Check repeat TSH with AM labs.  Obesity: Body mass index is 31.08 kg/m.  Complicates overall care and prognosis.  Recommend lifestyle modifications including physical activity and diet for weight loss and overall long-term health.   DVT prophylaxis: apixaban (ELIQUIS) tablet 2.5 mg Start: 09/21/20 0800 apixaban (ELIQUIS) tablet 2.5 mg   Diet:  Diet Orders (From admission, onward)     Start     Ordered   09/21/20 1339  Diet heart healthy/carb modified Room service appropriate? Yes; Fluid consistency: Thin  Diet effective now  Comments: Patient request - No red meat, no pork. Chicken or fish only. Thanks!  Question Answer Comment  Diet-HS Snack? Nothing   Room service appropriate? Yes   Fluid consistency: Thin      09/21/20  1338              Code Status: Full Code   Subjective 09/21/20    Pt seen with wife at bedside with use of virtual interpreter ID# [phone].  Patient seem lethargic and has audible wheezing.  Endorses productive cough, and cough sounds quite wet during encounter.  No fever/chills, N/V/D.     Disposition Plan & Communication   Status is: Inpatient  Remains inpatient appropriate because:Inpatient level of care appropriate due to severity of illness  Dispo: The patient is from: Home              Anticipated d/c is to: Home              Patient currently is not medically stable to d/c.   Difficult to place patient No   Family Communication: wife at bedside on rounds. Spoke with patient's son Derek Shelton (intensivist) by phone this afternoon.    Consults, Procedures, Significant Events   Consultants:  Cardiology  Procedures:    Antimicrobials:  Anti-infectives (From admission, onward)    Start     Dose/Rate Route Frequency Ordered Stop   09/22/20 1000  remdesivir 100 mg in sodium chloride 0.9 % 100 mL IVPB       See Hyperspace for full Linked Orders Report.   100 mg 200 mL/hr over 30 Minutes Intravenous Daily 09/21/20 0036 09/26/20 0959   09/21/20 0300  cefTRIAXone (ROCEPHIN) 1 g in sodium chloride 0.9 % 100 mL IVPB  Status:  Discontinued        1 g 200 mL/hr over 30 Minutes Intravenous Every 24 hours 09/21/20 0033 09/21/20 1225   09/21/20 0200  remdesivir 200 mg in sodium chloride 0.9% 250 mL IVPB       See Hyperspace for full Linked Orders Report.   200 mg 580 mL/hr over 30 Minutes Intravenous Once 09/21/20 0036 09/21/20 0520         Micro    Objective   Vitals:   09/21/20 1425 09/21/20 1500 09/21/20 1550 09/21/20 1613  BP: 108/77   124/69  Pulse:  88 (!) 38 79  Resp:  '20 18 18  '$ Temp:      TempSrc:      SpO2:  100% 100% 100%  Weight:        Intake/Output Summary (Last 24 hours) at 09/21/2020 1921 Last data filed at 09/21/2020 0732 Gross per 24 hour   Intake 59.39 ml  Output --  Net 59.39 ml   Filed Weights   08/29/2020 2245  Weight: 90 kg    Physical Exam:  General exam: lethargic but responsive, no acute distress, obese HEENT: clear conjunctiva, anicteric sclera, moist mucus membranes, hearing grossly normal  Respiratory system: audibly wheezing, diffusely diminished with high-pitched expiratory wheezes, mildly increased respiratory effort, wet-sounding cough, sats in mid-upper 90's on 2 L/min [place] O2. Cardiovascular system: irregularly irregular, b/l lower extremities with trace-1+ edema.   Gastrointestinal system: soft, NT, ND, +bowel sounds. Central nervous system: no gross focal neurologic deficits, normal speech, exam limited by pt's clinical condition Skin: dry, intact, normal temperature Psychiatry: normal mood, flat affect  Labs   Data Reviewed: I have personally reviewed following labs and imaging studies  CBC: Recent Labs  Lab 09/15/2020 2128  09/21/20 0344  WBC 3.5* 3.4*  NEUTROABS 2.0  --   HGB 7.0* 7.0*  HCT 22.9* 23.0*  MCV 92.0 92.4  PLT 131* 0000000*   Basic Metabolic Panel: Recent Labs  Lab 09/15/2020 2128 09/21/20 0344 09/21/20 1305  NA 135 133*  --   K 3.2* 3.0*  --   CL 94* 92*  --   CO2 32 30  --   GLUCOSE 148* 249*  --   BUN 69* 69*  --   CREATININE 1.75* 1.69*  --   CALCIUM 8.4* 8.3*  --   MG  --   --  2.3   GFR: Estimated Creatinine Clearance: 36.7 mL/min (A) (by C-G formula based on SCr of 1.69 mg/dL (H)). Liver Function Tests: Recent Labs  Lab 08/27/2020 2128  AST 35  ALT 20  ALKPHOS 73  BILITOT 0.8  PROT 6.9  ALBUMIN 3.2*   No results for input(s): LIPASE, AMYLASE in the last 168 hours. No results for input(s): AMMONIA in the last 168 hours. Coagulation Profile: Recent Labs  Lab 09/03/2020 2128  INR 1.7*   Cardiac Enzymes: No results for input(s): CKTOTAL, CKMB, CKMBINDEX, TROPONINI in the last 168 hours. BNP (last 3 results) No results for input(s): PROBNP in the last 8760  hours. HbA1C: No results for input(s): HGBA1C in the last 72 hours. CBG: No results for input(s): GLUCAP in the last 168 hours. Lipid Profile: No results for input(s): CHOL, HDL, LDLCALC, TRIG, CHOLHDL, LDLDIRECT in the last 72 hours. Thyroid Function Tests: No results for input(s): TSH, T4TOTAL, FREET4, T3FREE, THYROIDAB in the last 72 hours. Anemia Panel: Recent Labs    09/21/20 0344  FERRITIN 22*   Sepsis Labs: Recent Labs  Lab 09/15/2020 2128 09/21/20 0344  PROCALCITON  --  <0.10  LATICACIDVEN 0.9 1.2    Recent Results (from the past 240 hour(s))  Blood Culture (routine x 2)     Status: None (Preliminary result)   Collection Time: 09/05/2020  9:28 PM   Specimen: BLOOD  Result Value Ref Range Status   Specimen Description BLOOD BLOOD RIGHT FOREARM  Final   Special Requests   Final    BOTTLES DRAWN AEROBIC AND ANAEROBIC Blood Culture results may not be optimal due to an inadequate volume of blood received in culture bottles   Culture   Final    NO GROWTH < 12 HOURS Performed at [facility], [address] [place], [place] [address]    Report Status PENDING  Incomplete  Blood Culture (routine x 2)     Status: None (Preliminary result)   Collection Time: 09/06/2020  9:28 PM   Specimen: BLOOD  Result Value Ref Range Status   Specimen Description BLOOD RIGHT ANTECUBITAL  Final   Special Requests   Final    BOTTLES DRAWN AEROBIC AND ANAEROBIC Blood Culture adequate volume   Culture   Final    NO GROWTH < 12 HOURS Performed at [facility], [address], [place], [place] [address]    Report Status PENDING  Incomplete  Resp Panel by RT-PCR (Flu A&B, Covid) Nasopharyngeal Swab     Status: Abnormal   Collection Time: 09/19/2020  9:28 PM   Specimen: Nasopharyngeal Swab; Nasopharyngeal(NP) swabs in vial transport medium  Result Value Ref Range Status   SARS Coronavirus 2 by RT PCR POSITIVE (A) NEGATIVE Final    Comment: RESULT CALLED TO, READ BACK BY AND  VERIFIED WITH: MELISSA HOUP'@2253'$  09/18/2020 RH (NOTE) SARS-CoV-2 target nucleic acids are  DETECTED.  The SARS-CoV-2 RNA is generally detectable in upper respiratory specimens during the acute phase of infection. Positive results are indicative of the presence of the identified virus, but do not rule out bacterial infection or co-infection with other pathogens not detected by the test. Clinical correlation with patient history and other diagnostic information is necessary to determine patient infection status. The expected result is Negative.  Fact Sheet for Patients: EntrepreneurPulse.com.au  Fact Sheet for Healthcare Providers: IncredibleEmployment.be  This test is not yet approved or cleared by the [place] FDA and  has been authorized for detection and/or diagnosis of SARS-CoV-2 by FDA under an Emergency Use Authorization (EUA).  This EUA will remain in effect (meaning this test can be used ) for the duration of  the COVID-19 declaration under Section 564(b)(1) of the Act, 21 U.S.C. section 360bbb-3(b)(1), unless the authorization is terminated or revoked sooner.     Influenza A by PCR NEGATIVE NEGATIVE Final   Influenza B by PCR NEGATIVE NEGATIVE Final    Comment: (NOTE) The Xpert Xpress SARS-CoV-2/FLU/RSV plus assay is intended as an aid in the diagnosis of influenza from Nasopharyngeal swab specimens and should not be used as a sole basis for treatment. Nasal washings and aspirates are unacceptable for Xpert Xpress SARS-CoV-2/FLU/RSV testing.  Fact Sheet for Patients: EntrepreneurPulse.com.au  Fact Sheet for Healthcare Providers: IncredibleEmployment.be  This test is not yet approved or cleared by the [place] FDA and has been authorized for detection and/or diagnosis of SARS-CoV-2 by FDA under an Emergency Use Authorization (EUA). This EUA will remain in effect (meaning this test can be  used) for the duration of the COVID-19 declaration under Section 564(b)(1) of the Act, 21 U.S.C. section 360bbb-3(b)(1), unless the authorization is terminated or revoked.  Performed at [facility], [address] [place], [place] [address]       Imaging Studies   DG Chest Lampeter 1 View  Result Date: 08/24/2020 CLINICAL DATA:  CHF and COPD. EXAM: PORTABLE CHEST 1 VIEW COMPARISON:  Chest radiograph dated 07/16/2020. FINDINGS: There is cardiomegaly with central vascular congestion. No focal consolidation, pleural effusion, or pneumothorax. Atherosclerotic calcification of the aorta. No acute pathology. IMPRESSION: Cardiomegaly with central vascular congestion similar to prior radiograph. Electronically Signed   By: Anner Crete M.D.   On: 09/03/2020 21:26     Medications   Scheduled Meds:  apixaban  2.5 mg Oral BID   vitamin C  500 mg Oral Daily   atorvastatin  40 mg Oral q1800   cholecalciferol  1,000 Units Oral Daily   donepezil  5 mg Oral QHS   DULoxetine  60 mg Oral Daily   finasteride  5 mg Oral Daily   furosemide  40 mg Intravenous Q12H   gabapentin  100 mg Oral BID   guaiFENesin  600 mg Oral BID   insulin glargine-yfgn  30 Units Subcutaneous QHS   Ipratropium-Albuterol  2 puff Inhalation QID   latanoprost  1 drop Both Eyes QHS   levothyroxine  88 mcg Oral Q0600   loratadine  10 mg Oral Daily   methylPREDNISolone (SOLU-MEDROL) injection  40 mg Intravenous Q8H   Followed by   Derrill Memo ON 09/24/2020] predniSONE  40 mg Oral Q breakfast   [START ON 09/23/2020] metolazone  2.5 mg Oral Once per day on Sun Wed   metoprolol tartrate  25 mg Oral BID   mometasone-formoterol  2 puff Inhalation BID   omega-3 acid ethyl esters  1 g Oral Daily  pantoprazole  40 mg Oral Daily   [START ON 09/22/2020] potassium chloride SA  20 mEq Oral Daily   sildenafil  20 mg Oral TID   tamsulosin  0.4 mg Oral Daily   tiotropium  18 mcg Inhalation Daily   Vitamin D (Ergocalciferol)   50,000 Units Oral Weekly   zinc sulfate  220 mg Oral Daily   Continuous Infusions:  diltiazem (CARDIZEM) infusion 15 mg/hr (09/21/20 1121)   [START ON 09/22/2020] remdesivir 100 mg in NS 100 mL         LOS: 1 day    Time spent: 40 minutes with > 50% spent at bedside and in coordination of care     Ezekiel Slocumb, DO [facility]  09/21/2020, 7:21 PM      If 7PM-7AM, please contact night-coverage. How to contact the [facility] Attending or Consulting provider [place] or covering provider during after hours [place], for this patient?    Check the care team in [facility] and look for a) attending/consulting [facility] provider listed and b) the [facility] team listed Log into [email or web address] and use [place]'s universal password to access. If you do not have the password, please contact the hospital operator. Locate the [facility] provider you are looking for under [facility] and page to a number that you can be directly reached. If you still have difficulty reaching the provider, please page the [facility] (Director on Call) for the Hospitalists listed on amion for assistance.

## 2020-09-21 NOTE — ED Notes (Signed)
Admitting Md messaged in regard to pt's HR running between 110-130s with dilt infusing. MD also made aware pt EKG rhythm showing brief pauses. No new orders at this time

## 2020-09-22 LAB — CBC WITH DIFFERENTIAL/PLATELET
Abs Immature Granulocytes: 0.03 10*3/uL (ref 0.00–0.07)
Basophils Absolute: 0 10*3/uL (ref 0.0–0.1)
Basophils Relative: 0 %
Eosinophils Absolute: 0 10*3/uL (ref 0.0–0.5)
Eosinophils Relative: 0 %
HCT: 21.9 % — ABNORMAL LOW (ref 39.0–52.0)
Hemoglobin: 6.6 g/dL — ABNORMAL LOW (ref 13.0–17.0)
Immature Granulocytes: 1 %
Lymphocytes Relative: 10 %
Lymphs Abs: 0.3 10*3/uL — ABNORMAL LOW (ref 0.7–4.0)
MCH: 27.4 pg (ref 26.0–34.0)
MCHC: 30.1 g/dL (ref 30.0–36.0)
MCV: 90.9 fL (ref 80.0–100.0)
Monocytes Absolute: 0.1 10*3/uL (ref 0.1–1.0)
Monocytes Relative: 5 %
Neutro Abs: 2.4 10*3/uL (ref 1.7–7.7)
Neutrophils Relative %: 84 %
Platelets: 131 10*3/uL — ABNORMAL LOW (ref 150–400)
RBC: 2.41 MIL/uL — ABNORMAL LOW (ref 4.22–5.81)
RDW: 15.8 % — ABNORMAL HIGH (ref 11.5–15.5)
WBC: 2.8 10*3/uL — ABNORMAL LOW (ref 4.0–10.5)
nRBC: 0 % (ref 0.0–0.2)

## 2020-09-22 LAB — MAGNESIUM: Magnesium: 2.3 mg/dL (ref 1.7–2.4)

## 2020-09-22 LAB — GLUCOSE, CAPILLARY
Glucose-Capillary: 232 mg/dL — ABNORMAL HIGH (ref 70–99)
Glucose-Capillary: 241 mg/dL — ABNORMAL HIGH (ref 70–99)
Glucose-Capillary: 186 mg/dL — ABNORMAL HIGH (ref 70–99)
Glucose-Capillary: 202 mg/dL — ABNORMAL HIGH (ref 70–99)

## 2020-09-22 LAB — COMPREHENSIVE METABOLIC PANEL
ALT: 18 U/L (ref 0–44)
AST: 33 U/L (ref 15–41)
Albumin: 3.1 g/dL — ABNORMAL LOW (ref 3.5–5.0)
Alkaline Phosphatase: 66 U/L (ref 38–126)
Anion gap: 10 (ref 5–15)
BUN: 77 mg/dL — ABNORMAL HIGH (ref 8–23)
CO2: 29 mmol/L (ref 22–32)
Calcium: 8.3 mg/dL — ABNORMAL LOW (ref 8.9–10.3)
Chloride: 94 mmol/L — ABNORMAL LOW (ref 98–111)
Creatinine, Ser: 2.11 mg/dL — ABNORMAL HIGH (ref 0.61–1.24)
GFR, Estimated: 31 mL/min — ABNORMAL LOW (ref >60)
Glucose, Bld: 230 mg/dL — ABNORMAL HIGH (ref 70–99)
Potassium: 4 mmol/L (ref 3.5–5.1)
Sodium: 133 mmol/L — ABNORMAL LOW (ref 135–145)
Total Bilirubin: 0.8 mg/dL (ref 0.3–1.2)
Total Protein: 6.6 g/dL (ref 6.5–8.1)

## 2020-09-22 LAB — FERRITIN: Ferritin: 19 ng/mL — ABNORMAL LOW (ref 24–336)

## 2020-09-22 LAB — TSH: TSH: 0.71 u[IU]/mL (ref 0.350–4.500)

## 2020-09-22 LAB — D-DIMER, QUANTITATIVE: D-Dimer, Quant: <0.27 ug/mL-FEU (ref 0.00–0.50)

## 2020-09-22 LAB — PREPARE RBC (CROSSMATCH)

## 2020-09-22 LAB — HEMOGLOBIN AND HEMATOCRIT, BLOOD
HCT: 24.6 % — ABNORMAL LOW (ref 39.0–52.0)
Hemoglobin: 7.9 g/dL — ABNORMAL LOW (ref 13.0–17.0)

## 2020-09-22 LAB — C-REACTIVE PROTEIN: CRP: 0.6 mg/dL (ref <1.0)

## 2020-09-22 MED ORDER — DILTIAZEM HCL 60 MG PO TABS
60.0000 mg | ORAL_TABLET | Freq: Three times a day (TID) | ORAL | Status: DC
  Administered 2020-09-22 – 2020-09-24 (×7): 60 mg via ORAL
  Filled 2020-09-22 (×2): qty 2
  Filled 2020-09-22: qty 1
  Filled 2020-09-22: qty 2
  Filled 2020-09-22 (×6): qty 1
  Filled 2020-09-22: qty 2
  Filled 2020-09-22: qty 1
  Filled 2020-09-22 (×2): qty 2
  Filled 2020-09-22: qty 1
  Filled 2020-09-22: qty 2
  Filled 2020-09-22: qty 1

## 2020-09-22 MED ORDER — INSULIN ASPART 100 UNIT/ML IJ SOLN
4.0000 [IU] | Freq: Three times a day (TID) | INTRAMUSCULAR | Status: DC
  Administered 2020-09-23 (×3): 4 [IU] via SUBCUTANEOUS
  Filled 2020-09-22 (×3): qty 1

## 2020-09-22 MED ORDER — SODIUM CHLORIDE 0.9% IV SOLUTION: Freq: Once | INTRAVENOUS | Status: DC

## 2020-09-22 MED ORDER — FUROSEMIDE 10 MG/ML IJ SOLN: 40.0000 mg | Freq: Every day | INTRAMUSCULAR | Status: DC

## 2020-09-22 MED ORDER — INSULIN ASPART 100 UNIT/ML IJ SOLN
0.0000 [IU] | Freq: Three times a day (TID) | INTRAMUSCULAR | Status: DC
  Administered 2020-09-22: 2 [IU] via SUBCUTANEOUS
  Administered 2020-09-22 – 2020-09-23 (×5): 3 [IU] via SUBCUTANEOUS
  Administered 2020-09-24 – 2020-09-28 (×5): 1 [IU] via SUBCUTANEOUS
  Filled 2020-09-22 (×12): qty 1

## 2020-09-22 NOTE — Progress Notes (Signed)
[facility] Cardiology  SUBJECTIVE: Patient laying flat in bed, reports feeling better   Vitals:   09/22/20 0103 09/22/20 0121 09/22/20 0129 09/22/20 0436  BP:    112/86  Pulse:  69 71 95  Resp:   13 17  Temp:    97.8 F (36.6 C)  TempSrc:    Oral  SpO2:   99% 100%  Weight: 90 kg        Intake/Output Summary (Last 24 hours) at 09/22/2020 0827 Last data filed at 09/21/2020 2300 Gross per 24 hour  Intake 240 ml  Output --  Net 240 ml      PHYSICAL EXAM  General: Well developed, well nourished, in no acute distress HEENT:  Normocephalic and atramatic Neck:  No JVD.  Lungs: Clear bilaterally to auscultation and percussion. Heart: HRRR . Normal S1 and S2 without gallops or murmurs.  Abdomen: Bowel sounds are positive, abdomen soft and non-tender  Msk:  Back normal, normal gait. Normal strength and tone for age. Extremities: No clubbing, cyanosis or edema.   Neuro: Alert and oriented X 3. Psych:  Good affect, responds appropriately   LABS: Basic Metabolic Panel: Recent Labs    09/21/20 0344 09/21/20 1305 09/22/20 0500  NA 133*  --  133*  K 3.0*  --  4.0  CL 92*  --  94*  CO2 30  --  29  GLUCOSE 249*  --  230*  BUN 69*  --  77*  CREATININE 1.69*  --  2.11*  CALCIUM 8.3*  --  8.3*  MG  --  2.3 2.3   Liver Function Tests: Recent Labs    09/22/2020 2128 09/22/20 0500  AST 35 33  ALT 20 18  ALKPHOS 73 66  BILITOT 0.8 0.8  PROT 6.9 6.6  ALBUMIN 3.2* 3.1*   No results for input(s): LIPASE, AMYLASE in the last 72 hours. CBC: Recent Labs    08/31/2020 2128 09/21/20 0344 09/22/20 0500  WBC 3.5* 3.4* 2.8*  NEUTROABS 2.0  --  2.4  HGB 7.0* 7.0* 6.6*  HCT 22.9* 23.0* 21.9*  MCV 92.0 92.4 90.9  PLT 131* 128* 131*   Cardiac Enzymes: No results for input(s): CKTOTAL, CKMB, CKMBINDEX, TROPONINI in the last 72 hours. BNP: Invalid input(s): POCBNP D-Dimer: Recent Labs    09/21/20 0344 09/22/20 0500  DDIMER 0.27 <0.27   Hemoglobin A1C: No results for input(s):  HGBA1C in the last 72 hours. Fasting Lipid Panel: No results for input(s): CHOL, HDL, LDLCALC, TRIG, CHOLHDL, LDLDIRECT in the last 72 hours. Thyroid Function Tests: Recent Labs    09/22/20 0500  TSH 0.710   Anemia Panel: Recent Labs    09/22/20 0500  FERRITIN 62*    DG Chest Port 1 View  Result Date: 09/03/2020 CLINICAL DATA:  CHF and COPD. EXAM: PORTABLE CHEST 1 VIEW COMPARISON:  Chest radiograph dated 07/16/2020. FINDINGS: There is cardiomegaly with central vascular congestion. No focal consolidation, pleural effusion, or pneumothorax. Atherosclerotic calcification of the aorta. No acute pathology. IMPRESSION: Cardiomegaly with central vascular congestion similar to prior radiograph. Electronically Signed   By: Anner Crete M.D.   On: 09/04/2020 21:26     Echo LVEF greater than 55% by 2D echocardiogram 07/17/2020  TELEMETRY: Atrial fibrillation 90 to 100 bpm:  ASSESSMENT AND PLAN:  Principal Problem:   COVID-19 virus infection Active Problems:   Bradycardia   Type 2 diabetes mellitus without complication (HCC)   OSA (obstructive sleep apnea)   Atrial fibrillation with RVR ([place])  Esophageal reflux   Essential hypertension   Spinal stenosis of lumbar region   BPH (benign prostatic hyperplasia)   Dementia without behavioral disturbance (HCC)   Acute on chronic diastolic CHF (congestive heart failure) (HCC)   PAH (pulmonary artery hypertension) (HCC)   History of CVA (cerebrovascular accident)   Chronic kidney disease, stage 3a (HCC)   COPD (chronic obstructive pulmonary disease) (HCC)   Chronic respiratory failure with hypoxia ([place])    1.   Respiratory failure, multifactorial, secondary to acute on chronic diastolic congestive heart failure, COPD exacerbation, with underlying COVID-19 infection, slowly improving 2.  Atrial fibrillation with rapid ventricular rate, compensatory for respiratory failure, COVID-19 infection and anemia, on diltiazem drip for rate  control, and Eliquis for stroke prevention, rate improved, near baseline 9200 and BPM 3.  Chronic diastolic congestive heart failure, LVEF greater than 55% by 2D echocardiogram 07/17/2020 4.  COPD exacerbation 5.  COVID-19 infection, on IV remdesivir 6.  Chronic kidney disease, worsening BUN and creatinine 77 and 2.11 with GFR 31 7.  Anemia, hemoglobin adequate 7.0 and 23.0, respectively 8.  Pulmonary hypertension, on sildenafil   Recommendations   1.  Agree with overall current therapy 2.  Continue diuresis, decrease furosemide to 40 mg IV daily 3.  Carefully monitor renal status (patient experienced dehydration previously from overdiuresis) 4.  Transition diltiazem drip to p.o. diltiazem 5.  Continue Eliquis for stroke prevention      Isaias Cowman, MD, PhD, [facility] 09/22/2020 8:27 AM

## 2020-09-22 NOTE — Progress Notes (Signed)
Inpatient Diabetes Program Recommendations  AACE/ADA: New Consensus Statement on Inpatient Glycemic Control (2015)  Target Ranges:  Prepandial:   less than 140 mg/dL      Peak postprandial:   less than 180 mg/dL (1-2 hours)      Critically ill patients:  140 - 180 mg/dL  Results for ROCHELLE, WAJDA (MRN [id number]) as of 09/22/2020 13:52  Ref. Range 09/22/2020 08:54 09/22/2020 13:00  Glucose-Capillary Latest Ref Range: 70 - 99 mg/dL 232 (H) 241 (H)   Home DM Meds: Tresiba 30 units QHS  Current Orders: Semglee 30 units QHS   Novolog 0-9 units TID   MD- Note pt getting Solumedrol 40 mg Q8 hours  Please consider adding Novolog Meal Coverage while pt getting steroids:  Novolog 4 units TID with meals  Hold if pt eats <50% of meal, Hold if pt NPO    --Will follow patient during hospitalization--  Wyn Quaker RN, MSN, CDE Diabetes Coordinator Inpatient Glycemic Control Team Team Pager: [phone] (8a-5p)

## 2020-09-22 NOTE — Progress Notes (Signed)
Noted BP is a little soft when checking vitals for blood administration.   Cardiology and [facility] aware.    Will continue to monitor per Cardiology

## 2020-09-22 NOTE — Progress Notes (Signed)
PROGRESS NOTE    Derek Shelton   [id number]  DOB: [birth year]  PCP: Derek Lighter, MD    DOA: 09/08/2020 LOS: 2    Brief Narrative / Hospital Course to Date:   HPI per H&P: "80 y.o. Derek Shelton male with medical history significant for asthma, COPD, CHF, coronary artery disease, type 2 diabetes mellitus, hypertension, dyslipidemia and hypothyroidism, who presented to the emergency room after having a positive COVID with home antigen test.  The patient has been having dyspnea with associated productive cough and wheezing lately with significant diminished appetite and tachycardia at 130-140 today per his son who provides the history over the phone.  He has been having nausea without vomiting..."   Patient found to be in A-fib with RVR.  Chest xray consistent with central vascular congestion.  Covid-19 PCR positive.  Requiring baseline 2 L/min oxygen.  Admitted to [facility] service with cardiology consulted. On Cardizem drip.   Assessment & Plan   Principal Problem:   COVID-19 virus infection Active Problems:   Atrial fibrillation with RVR (HCC)   Acute on chronic diastolic CHF (congestive heart failure) (HCC)   Bradycardia   Type 2 diabetes mellitus without complication (HCC)   Essential hypertension   PAH (pulmonary artery hypertension) (HCC)   Chronic kidney disease, stage 3a (HCC)   COPD (chronic obstructive pulmonary disease) (HCC)   Chronic respiratory failure with hypoxia (HCC)   OSA (obstructive sleep apnea)   Esophageal reflux   Spinal stenosis of lumbar region   BPH (benign prostatic hyperplasia)   Dementia without behavioral disturbance (HCC)   History of CVA (cerebrovascular accident)   Acute on chronic diastolic CHF A-fib with RVR Hx of Bradycardia, intermittent Management per Cardiology Initially on Cardizem gtt - transitioned to PO today IV Lasix reduced from BID to daily given bump in Cr Monitor renal function and electrolytes Monitor on  telemetry Replace K and Mg as needed Follow BMP, Mg Continue Eliquis and Lopressor  Covid-19 infection COPD with acute exacerbation Chronic respiratory failure with hypoxia  on 2 L/min O2 at baseline Continue remdesivir and IV steroids. D/C Rocephin since procal negative; monitor clinically. Follow sputum culture Mucolytics, bronchodilators Follow inflammatory markers, CMP, CBC Vitamins D3, C, Zinc Supplement O2 to keep sats at or above 90%  Pulmonary Hypertension - continue sildenafil, supplement oxygen.  Type 2 Diabetes- continue basal insulin and sliding scale Novolog.  Monitor for steroid-induced hyperglycemia. Titrate insulin (140-180 inpatient goal).  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  Hbg 6.6 this AM.   Transfuse 1 units pRBC's.  Follow Hbg & transfuse if <7.0  Peripheral neuropathy - continue gabapentin  OSA - noncompliant with CPAP  Chronic rhinitis - continue Claritin  Hyperlipidemia - on statin  GERD - continue PPI  Dementia - continue Aricept --Delirium precautions:     -Lights and TV off, minimize interruptions at night    -Blinds open and lights on during day    -Glasses/hearing aid with patient    -Frequent reorientation    -PT/OT when able    -Avoid sedation medications/Beers list medications  BPH- continue home Flomax and Proscar  Hypothyroidism - continue Synthroid. TSH in June was elevated at 7.182 Check repeat TSH with AM labs.  Obesity: Body mass index is 31.08 kg/m.  Complicates overall care and prognosis.  Recommend lifestyle modifications including physical activity and diet for weight loss and overall long-term health.   DVT prophylaxis: apixaban (ELIQUIS) tablet 2.5 mg Start: 09/21/20 0800 apixaban (ELIQUIS)  tablet 2.5 mg   Diet:  Diet Orders (From admission, onward)     Start     Ordered   09/21/20 1339  Diet heart healthy/carb modified Room service appropriate? Yes; Fluid consistency: Thin   Diet effective now       Comments: Patient request - No red meat, no pork. Chicken or fish only. Thanks!  Question Answer Comment  Diet-HS Snack? Nothing   Room service appropriate? Yes   Fluid consistency: Thin      09/21/20 1338              Code Status: Full Code   Subjective 09/22/20    Pt seen with wife at bedside.  He continues to have wheezing, but stable o2 requirement on his baseline 2 L/min.  Wife reports he continues to have bad cough.  Eating well.  Heart rate under control now.  No acute events reported.    Disposition Plan & Communication   Status is: Inpatient  Remains inpatient appropriate because:Inpatient level of care appropriate due to severity of illness  Dispo: The patient is from: Home              Anticipated d/c is to: Home              Patient currently is not medically stable to d/c.   Difficult to place patient No   Family Communication: wife at bedside on rounds 8/29, 8/30. Spoke with patient's son Derek Shelton (intensivist) by phone 8/29.    Consults, Procedures, Significant Events   Consultants:  Cardiology  Procedures:    Antimicrobials:  Anti-infectives (From admission, onward)    Start     Dose/Rate Route Frequency Ordered Stop   09/22/20 1000  remdesivir 100 mg in sodium chloride 0.9 % 100 mL IVPB       See Hyperspace for full Linked Orders Report.   100 mg 200 mL/hr over 30 Minutes Intravenous Daily 09/21/20 0036 09/26/20 0959   09/21/20 0300  cefTRIAXone (ROCEPHIN) 1 g in sodium chloride 0.9 % 100 mL IVPB  Status:  Discontinued        1 g 200 mL/hr over 30 Minutes Intravenous Every 24 hours 09/21/20 0033 09/21/20 1225   09/21/20 0200  remdesivir 200 mg in sodium chloride 0.9% 250 mL IVPB       See Hyperspace for full Linked Orders Report.   200 mg 580 mL/hr over 30 Minutes Intravenous Once 09/21/20 0036 09/21/20 0520         Micro    Objective   Vitals:   09/22/20 1445 09/22/20 1513 09/22/20 1515 09/22/20 1530  BP:  94/72 (!) 87/69 (!) 89/63 (!) 87/68  Pulse: 89 (!) 25 69 75  Resp: '17 17 17 16  '$ Temp: 97.8 F (36.6 C) 97.8 F (36.6 C)    TempSrc: Oral Oral    SpO2: 99% 99% 95% 99%  Weight:        Intake/Output Summary (Last 24 hours) at 09/22/2020 1727 Last data filed at 09/22/2020 0851 Gross per 24 hour  Intake 480 ml  Output --  Net 480 ml   Filed Weights   09/03/2020 2245 09/22/20 0103  Weight: 90 kg 90 kg    Physical Exam:  General exam: sleeping comfortably, wakes to voice, no acute distress, obese Respiratory system: still has audible wheezing, diffusely diminished breath sounds with expiratory wheezes, normal respiratory effort, on 2 L/min [place] O2. Cardiovascular system: irregular rhythm, regular rate, b/l lower extremities with resolved edema.  Gastrointestinal system: soft, NT, ND Central nervous system: exam limited by pt's clinical condition, grossly non-focal exam   Labs   Data Reviewed: I have personally reviewed following labs and imaging studies  CBC: Recent Labs  Lab 09/07/2020 2128 09/21/20 0344 09/22/20 0500  WBC 3.5* 3.4* 2.8*  NEUTROABS 2.0  --  2.4  HGB 7.0* 7.0* 6.6*  HCT 22.9* 23.0* 21.9*  MCV 92.0 92.4 90.9  PLT 131* 128* [id number]*   Basic Metabolic Panel: Recent Labs  Lab 09/03/2020 2128 09/21/20 0344 09/21/20 1305 09/22/20 0500  NA 135 133*  --  133*  K 3.2* 3.0*  --  4.0  CL 94* 92*  --  94*  CO2 32 30  --  29  GLUCOSE 148* 249*  --  230*  BUN 69* 69*  --  77*  CREATININE 1.75* 1.69*  --  2.11*  CALCIUM 8.4* 8.3*  --  8.3*  MG  --   --  2.3 2.3   GFR: Estimated Creatinine Clearance: 29.4 mL/min (A) (by C-G formula based on SCr of 2.11 mg/dL (H)). Liver Function Tests: Recent Labs  Lab 09/12/2020 2128 09/22/20 0500  AST 35 33  ALT 20 18  ALKPHOS 73 66  BILITOT 0.8 0.8  PROT 6.9 6.6  ALBUMIN 3.2* 3.1*   No results for input(s): LIPASE, AMYLASE in the last 168 hours. No results for input(s): AMMONIA in the last 168 hours. Coagulation  Profile: Recent Labs  Lab 09/12/2020 2128  INR 1.7*   Cardiac Enzymes: No results for input(s): CKTOTAL, CKMB, CKMBINDEX, TROPONINI in the last 168 hours. BNP (last 3 results) No results for input(s): PROBNP in the last 8760 hours. HbA1C: No results for input(s): HGBA1C in the last 72 hours. CBG: Recent Labs  Lab 09/21/20 2154 09/22/20 0854 09/22/20 1300 09/22/20 1725  GLUCAP 271* 232* 241* 186*   Lipid Profile: No results for input(s): CHOL, HDL, LDLCALC, TRIG, CHOLHDL, LDLDIRECT in the last 72 hours. Thyroid Function Tests: Recent Labs    09/22/20 0500  TSH 0.710   Anemia Panel: Recent Labs    09/21/20 0344 09/22/20 0500  FERRITIN 22* 19*   Sepsis Labs: Recent Labs  Lab 09/06/2020 2128 09/21/20 0344  PROCALCITON  --  <0.10  LATICACIDVEN 0.9 1.2    Recent Results (from the past 240 hour(s))  Blood Culture (routine x 2)     Status: None (Preliminary result)   Collection Time: 09/04/2020  9:28 PM   Specimen: BLOOD  Result Value Ref Range Status   Specimen Description BLOOD BLOOD RIGHT FOREARM  Final   Special Requests   Final    BOTTLES DRAWN AEROBIC AND ANAEROBIC Blood Culture results may not be optimal due to an inadequate volume of blood received in culture bottles   Culture   Final    NO GROWTH 2 DAYS Performed at [facility], [address] [place], [place] [address]    Report Status PENDING  Incomplete  Blood Culture (routine x 2)     Status: None (Preliminary result)   Collection Time: 09/05/2020  9:28 PM   Specimen: BLOOD  Result Value Ref Range Status   Specimen Description BLOOD RIGHT ANTECUBITAL  Final   Special Requests   Final    BOTTLES DRAWN AEROBIC AND ANAEROBIC Blood Culture adequate volume   Culture   Final    NO GROWTH 2 DAYS Performed at [facility], [address] [place], [place] [address]    Report Status PENDING  Incomplete  Resp Panel by RT-PCR (Flu A&B, Covid) Nasopharyngeal Swab     Status: Abnormal    Collection Time: 09/16/2020  9:28 PM   Specimen: Nasopharyngeal Swab; Nasopharyngeal(NP) swabs in vial transport medium  Result Value Ref Range Status   SARS Coronavirus 2 by RT PCR POSITIVE (A) NEGATIVE Final    Comment: RESULT CALLED TO, READ BACK BY AND VERIFIED WITH: MELISSA HOUP'@[id number]'$  09/11/2020 RH (NOTE) SARS-CoV-2 target nucleic acids are DETECTED.  The SARS-CoV-2 RNA is generally detectable in upper respiratory specimens during the acute phase of infection. Positive results are indicative of the presence of the identified virus, but do not rule out bacterial infection or co-infection with other pathogens not detected by the test. Clinical correlation with patient history and other diagnostic information is necessary to determine patient infection status. The expected result is Negative.  Fact Sheet for Patients: EntrepreneurPulse.com.au  Fact Sheet for Healthcare Providers: IncredibleEmployment.be  This test is not yet approved or cleared by the [place] FDA and  has been authorized for detection and/or diagnosis of SARS-CoV-2 by FDA under an Emergency Use Authorization (EUA).  This EUA will remain in effect (meaning this test can be used ) for the duration of  the COVID-19 declaration under Section 564(b)(1) of the Act, 21 U.S.C. section 360bbb-3(b)(1), unless the authorization is terminated or revoked sooner.     Influenza A by PCR NEGATIVE NEGATIVE Final   Influenza B by PCR NEGATIVE NEGATIVE Final    Comment: (NOTE) The Xpert Xpress SARS-CoV-2/FLU/RSV plus assay is intended as an aid in the diagnosis of influenza from Nasopharyngeal swab specimens and should not be used as a sole basis for treatment. Nasal washings and aspirates are unacceptable for Xpert Xpress SARS-CoV-2/FLU/RSV testing.  Fact Sheet for Patients: EntrepreneurPulse.com.au  Fact Sheet for Healthcare  Providers: IncredibleEmployment.be  This test is not yet approved or cleared by the [place] FDA and has been authorized for detection and/or diagnosis of SARS-CoV-2 by FDA under an Emergency Use Authorization (EUA). This EUA will remain in effect (meaning this test can be used) for the duration of the COVID-19 declaration under Section 564(b)(1) of the Act, 21 U.S.C. section 360bbb-3(b)(1), unless the authorization is terminated or revoked.  Performed at [facility], [address] [place], [place] [address]       Imaging Studies   DG Chest [place] 1 View  Result Date: 08/27/2020 CLINICAL DATA:  CHF and COPD. EXAM: PORTABLE CHEST 1 VIEW COMPARISON:  Chest radiograph dated 07/16/2020. FINDINGS: There is cardiomegaly with central vascular congestion. No focal consolidation, pleural effusion, or pneumothorax. Atherosclerotic calcification of the aorta. No acute pathology. IMPRESSION: Cardiomegaly with central vascular congestion similar to prior radiograph. Electronically Signed   By: Anner Crete M.D.   On: 08/24/2020 21:26     Medications   Scheduled Meds:  sodium chloride   Intravenous Once   apixaban  2.5 mg Oral BID   vitamin C  500 mg Oral Daily   atorvastatin  40 mg Oral q1800   cholecalciferol  1,000 Units Oral Daily   diltiazem  60 mg Oral Q8H   donepezil  5 mg Oral QHS   DULoxetine  60 mg Oral Daily   finasteride  5 mg Oral Daily   [START ON 09/23/2020] furosemide  40 mg Intravenous Daily   gabapentin  100 mg Oral BID   guaiFENesin  600 mg Oral BID   insulin aspart  0-9 Units Subcutaneous TID WC   insulin glargine-yfgn  30 Units Subcutaneous  QHS   Ipratropium-Albuterol  2 puff Inhalation QID   latanoprost  1 drop Both Eyes QHS   levothyroxine  88 mcg Oral Q0600   loratadine  10 mg Oral Daily   methylPREDNISolone (SOLU-MEDROL) injection  40 mg Intravenous Q8H   Followed by   Derrill Memo ON 09/24/2020] predniSONE  40 mg Oral Q breakfast    [START ON 09/23/2020] metolazone  2.5 mg Oral Once per day on Sun Wed   metoprolol tartrate  25 mg Oral BID   mometasone-formoterol  2 puff Inhalation BID   omega-3 acid ethyl esters  1 g Oral Daily   pantoprazole  40 mg Oral Daily   potassium chloride SA  20 mEq Oral Daily   sildenafil  20 mg Oral TID   tamsulosin  0.4 mg Oral Daily   tiotropium  18 mcg Inhalation Daily   Vitamin D (Ergocalciferol)  50,000 Units Oral Weekly   zinc sulfate  220 mg Oral Daily   Continuous Infusions:  remdesivir 100 mg in NS 100 mL 100 mg (09/22/20 1038)       LOS: 2 days    Time spent: 30 minutes with > 50% spent at bedside and in coordination of care     Ezekiel Slocumb, DO [facility]  09/22/2020, 5:27 PM      If 7PM-7AM, please contact night-coverage. How to contact the [facility] Attending or Consulting provider [place] or covering provider during after hours [place], for this patient?    Check the care team in [facility] and look for a) attending/consulting [facility] provider listed and b) the [facility] team listed Log into [email or web address] and use 's universal password to access. If you do not have the password, please contact the hospital operator. Locate the [facility] provider you are looking for under [facility] and page to a number that you can be directly reached. If you still have difficulty reaching the provider, please page the [facility] (Director on Call) for the Hospitalists listed on amion for assistance.

## 2020-09-22 NOTE — Plan of Care (Signed)
  Problem: Education: Goal: Knowledge of General Education information will improve Description Including pain rating scale, medication(s)/side effects and non-pharmacologic comfort measures Outcome: Progressing   Problem: Health Behavior/Discharge Planning: Goal: Ability to manage health-related needs will improve Outcome: Progressing   

## 2020-09-23 LAB — CBC WITH DIFFERENTIAL/PLATELET
Abs Immature Granulocytes: 0.02 10*3/uL (ref 0.00–0.07)
Basophils Absolute: 0 10*3/uL (ref 0.0–0.1)
Basophils Relative: 0 %
Eosinophils Absolute: 0 10*3/uL (ref 0.0–0.5)
Eosinophils Relative: 0 %
HCT: 24.8 % — ABNORMAL LOW (ref 39.0–52.0)
Hemoglobin: 8.1 g/dL — ABNORMAL LOW (ref 13.0–17.0)
Immature Granulocytes: 0 %
Lymphocytes Relative: 7 %
Lymphs Abs: 0.4 10*3/uL — ABNORMAL LOW (ref 0.7–4.0)
MCH: 28.7 pg (ref 26.0–34.0)
MCHC: 32.7 g/dL (ref 30.0–36.0)
MCV: 87.9 fL (ref 80.0–100.0)
Monocytes Absolute: 0.2 10*3/uL (ref 0.1–1.0)
Monocytes Relative: 4 %
Neutro Abs: 4.8 10*3/uL (ref 1.7–7.7)
Neutrophils Relative %: 89 %
Platelets: 151 10*3/uL (ref 150–400)
RBC: 2.82 MIL/uL — ABNORMAL LOW (ref 4.22–5.81)
RDW: 15.8 % — ABNORMAL HIGH (ref 11.5–15.5)
WBC: 5.4 10*3/uL (ref 4.0–10.5)
nRBC: 0 % (ref 0.0–0.2)

## 2020-09-23 LAB — BPAM RBC
Blood Product Expiration Date: 202209052359
ISSUE DATE / TIME: 202208301451
Unit Type and Rh: 600

## 2020-09-23 LAB — URINE CULTURE: Culture: <10000 — AB

## 2020-09-23 LAB — D-DIMER, QUANTITATIVE: D-Dimer, Quant: <0.27 ug/mL-FEU (ref 0.00–0.50)

## 2020-09-23 LAB — COMPREHENSIVE METABOLIC PANEL
ALT: 19 U/L (ref 0–44)
AST: 30 U/L (ref 15–41)
Albumin: 3.3 g/dL — ABNORMAL LOW (ref 3.5–5.0)
Alkaline Phosphatase: 62 U/L (ref 38–126)
Anion gap: 11 (ref 5–15)
BUN: 91 mg/dL — ABNORMAL HIGH (ref 8–23)
CO2: 29 mmol/L (ref 22–32)
Calcium: 8.4 mg/dL — ABNORMAL LOW (ref 8.9–10.3)
Chloride: 93 mmol/L — ABNORMAL LOW (ref 98–111)
Creatinine, Ser: 2.66 mg/dL — ABNORMAL HIGH (ref 0.61–1.24)
GFR, Estimated: 23 mL/min — ABNORMAL LOW (ref >60)
Glucose, Bld: 161 mg/dL — ABNORMAL HIGH (ref 70–99)
Potassium: 4.9 mmol/L (ref 3.5–5.1)
Sodium: 133 mmol/L — ABNORMAL LOW (ref 135–145)
Total Bilirubin: 1.5 mg/dL — ABNORMAL HIGH (ref 0.3–1.2)
Total Protein: 6.9 g/dL (ref 6.5–8.1)

## 2020-09-23 LAB — TYPE AND SCREEN
ABO/RH(D): A POS
Antibody Screen: NEGATIVE
Unit division: 0

## 2020-09-23 LAB — HEMOGLOBIN A1C
Hgb A1c MFr Bld: 6.6 % — ABNORMAL HIGH (ref 4.8–5.6)
Mean Plasma Glucose: 143 mg/dL

## 2020-09-23 LAB — FERRITIN: Ferritin: 21 ng/mL — ABNORMAL LOW (ref 24–336)

## 2020-09-23 LAB — GLUCOSE, CAPILLARY: Glucose-Capillary: 199 mg/dL — ABNORMAL HIGH (ref 70–99)

## 2020-09-23 LAB — MAGNESIUM: Magnesium: 2.5 mg/dL — ABNORMAL HIGH (ref 1.7–2.4)

## 2020-09-23 LAB — C-REACTIVE PROTEIN: CRP: 0.6 mg/dL (ref <1.0)

## 2020-09-23 MED ORDER — DEXAMETHASONE SODIUM PHOSPHATE 10 MG/ML IJ SOLN
6.0000 mg | Freq: Every day | INTRAMUSCULAR | Status: DC
Start: 2020-09-23 — End: 2020-09-23
  Administered 2020-09-23: 6 mg via INTRAVENOUS
  Filled 2020-09-23: qty 1

## 2020-09-23 MED ORDER — AZITHROMYCIN 250 MG PO TABS
250.0000 mg | ORAL_TABLET | Freq: Every day | ORAL | Status: DC
  Administered 2020-09-24: 250 mg via ORAL
  Filled 2020-09-23 (×2): qty 1

## 2020-09-23 MED ORDER — AZITHROMYCIN 250 MG PO TABS
500.0000 mg | ORAL_TABLET | Freq: Every day | ORAL | Status: AC
  Administered 2020-09-23: 500 mg via ORAL
  Filled 2020-09-23: qty 2

## 2020-09-23 MED ORDER — METHYLPREDNISOLONE SODIUM SUCC 40 MG IJ SOLR
40.0000 mg | Freq: Two times a day (BID) | INTRAMUSCULAR | Status: DC
  Administered 2020-09-23 – 2020-09-25 (×4): 40 mg via INTRAVENOUS
  Filled 2020-09-23 (×4): qty 1

## 2020-09-23 NOTE — Evaluation (Signed)
Physical Therapy Evaluation Patient Details Name: Derek Shelton MRN: [id number] DOB: 1940-11-29 Today's Date: 09/23/2020   History of Present Illness  80 y/o male here with shortness of breath and + Covid test.  H/o COPD, CHF, athsma, diabetes, CAD, HTN and hypothyroidism.  Pt apparently had Covid 9 months ago as well.  Clinical Impression  Pt willing to participate with PT but functionally was quite limited.  He needed a lot of assist to get in/out of bed but once assist to EOB he was able to maintain sitting balance with only occasional min assist.  Multiple standing bouts at EOB, none of which lasted more than 30 seconds and required heavy assist to attain standing and constant assist to insure maintaining upright despite heavy walker use.  He showed some effort with trying to take side steps but was unable to unweight, quick to fatigue and sat rather abruptly with each attempt.  Family very motivated to try and take him home but per today's performance this will likely be very difficult.  PT recommending STR, family hoping to see enough improvement before d/c to be able to manage at home, where he will have 24/7 assist.    Follow Up Recommendations SNF;Supervision/Assistance - 24 hour (HHPT if family decides to take him home)    Equipment Recommendations  None recommended by PT (per conversation with son has all the equipement they need)    Recommendations for Other Services       Precautions / Restrictions Precautions Precautions: Fall Restrictions Weight Bearing Restrictions: No      Mobility  Bed Mobility Overal bed mobility: Needs Assistance Bed Mobility: Supine to Sit;Sit to Supine     Supine to sit: Mod assist Sit to supine: Max assist   General bed mobility comments: Pt showed some assist with getting to EOB but ultimately needed heavy assist from/back to supine.    Transfers Overall transfer level: Needs assistance Equipment used: Rolling walker (2  wheeled) Transfers: Sit to/from Stand Sit to Stand: Mod assist;Max assist         General transfer comment: multiple standing attempts with limited tolerance.  He needed considerable assist to attain and maintain standing. O2 appeared to stay in the high 80s/low 90s on 2L and HR increased from 100s to ~120.  On each attempt he displayed good effort but ultimately could not mantain very long and sat down with little warning.  Ambulation/Gait             General Gait Details: Attempted to do some limited side stepping with minimal success.  He showed some effort but was simply too weak to maintain balance and try to step/unweight even with considerable assist  Stairs            Wheelchair Mobility    Modified Rankin (Stroke Patients Only)       Balance                                             Pertinent Vitals/Pain Pain Assessment: No/denies pain    Home Living Family/patient expects to be discharged to:: Private residence Living Arrangements: Spouse/significant other Available Help at Discharge: Family;Available 24 hours/day Type of Home: House Home Access: Stairs to enter Entrance Stairs-Rails: Left Entrance Stairs-Number of Steps: 4 STE   Home Equipment: Walker - 2 wheels;Wheelchair - manual;Cane - single point  Prior Function Level of Independence: Needs assistance   Gait / Transfers Assistance Needed: Able to manage limited in home distances (<50 ft and generally not more than 30), someone with him essentially any time he is up  ADL's / Homemaking Assistance Needed: Wife and children provide assist with all ADLs        Hand Dominance        Extremity/Trunk Assessment   Upper Extremity Assessment Upper Extremity Assessment: Generalized weakness    Lower Extremity Assessment Lower Extremity Assessment: Generalized weakness       Communication   Communication: Prefers language other than English (Urdu, intially used  [employer] Interpeters 5-10 minutes, this did not work particularly well and wife suggested we call son Deatra Canter) who served as interp for the remainder of the session)  Cognition Arousal/Alertness: Lethargic Behavior During Therapy: Anxious;WFL for tasks assessed/performed Overall Cognitive Status: History of cognitive impairments - at baseline                                        General Comments General comments (skin integrity, edema, etc.): Pt willing to work with PT and showed some effort, but overall was functionally very limited.  He endorses feeling shakey and weak with sitting and standing    Exercises     Assessment/Plan    PT Assessment Patient needs continued PT services  PT Problem List Decreased strength;Decreased range of motion;Decreased activity tolerance;Decreased balance;Decreased mobility;Decreased coordination;Decreased cognition;Decreased knowledge of use of DME;Decreased safety awareness;Cardiopulmonary status limiting activity       PT Treatment Interventions DME instruction;Gait training;Functional mobility training;Therapeutic activities;Balance training;Stair training;Therapeutic exercise;Neuromuscular re-education;Patient/family education    PT Goals (Current goals can be found in the Care Plan section)  Acute Rehab PT Goals Patient Stated Goal: Family very motivated to take him home. PT Goal Formulation: With patient Time For Goal Achievement: 10/07/20 Potential to Achieve Goals: Fair    Frequency Min 2X/week   Barriers to discharge        Co-evaluation               AM-PAC PT "6 Clicks" Mobility  Outcome Measure Help needed turning from your back to your side while in a flat bed without using bedrails?: A Lot Help needed moving from lying on your back to sitting on the side of a flat bed without using bedrails?: A Lot Help needed moving to and from a bed to a chair (including a wheelchair)?: Total Help needed standing up from a  chair using your arms (e.g., wheelchair or bedside chair)?: Total Help needed to walk in hospital room?: Total Help needed climbing 3-5 steps with a railing? : Total 6 Click Score: 8    End of Session Equipment Utilized During Treatment: Oxygen Activity Tolerance: Patient limited by fatigue Patient left: with bed alarm set;with call bell/phone within reach;with family/visitor present   PT Visit Diagnosis: Muscle weakness (generalized) (M62.81);Difficulty in walking, not elsewhere classified (R26.2);Unsteadiness on feet (R26.81)    Time: 0925-1000 PT Time Calculation (min) (ACUTE ONLY): 35 min   Charges:   PT Evaluation $PT Eval Moderate Complexity: 1 Mod PT Treatments $Therapeutic Activity: 8-22 mins        Kreg Shropshire, DPT 09/23/2020, 11:50 AM

## 2020-09-23 NOTE — Progress Notes (Signed)
[facility] PROGRESS NOTE    Derek Shelton  [id number] DOB: [birth year] DOA: 09/19/2020 PCP: Gladstone Lighter, MD      Brief Narrative:  Derek Shelton is a 80 y.o. M with obesity, COPD, CHF, DM, CAD, HTN, and hypothyroidism who prseneted with positive home COVID test, tachycardia, cough, and wheezing.  In the ER, found to have tachycardia, A. fib with RVR, chest x-ray with congestion, COVID-positive.         Assessment & Plan:  Acute on chronic hypoxic respiratory failure On 2L home O2  COVID-19 - Continue remdesivir - Start steroids  Acute on chronic diastolic CHF - Hold home torsemide - Hold Lasix with worsening Cr   AKI on CKD IIIa Cr 1.5 at baseline, today up to 2.6 with diuresis - Hold Lasix  Atrial fibrillation with RVR Rate still  100s - Continue apixababn - Continue diltaizem - Continue metoprolol - Consult Cardiology  COPD with exacerbation Very wheezy and coughing and out of breathe - Start steroids -Continue scheduled bronchodilators - Start azithromycin -Continue spiriva  Diabetes Glucoses controlled -Continue Glargine -Continue S correctoins and mealtime  Other meds -Continue finasteride -Continue PPI -Continue duloxeitne -Continue gabapentin -Continue loratadine -Continue latanaprost  Dementia -Continue donepezil  Hypothyroidism - Continue levothyroxine   Acute on chronic anemia No bleeding.  Hgb transfused 1 unit yeserday, Hgb up to 8 and stable post-transfusikon            Disposition: Status is: Inpatient  Remains inpatient appropriate because:IV treatments appropriate due to intensity of illness or inability to take PO  Dispo: The patient is from: Home              Anticipated d/c is to: SNF              Patient currently is not medically stable to d/c.   Difficult to place patient No       Level of care: Progressive Cardiac       MDM: The below labs and imaging reports  were reviewed and summarized above.  Medication management as above.    DVT prophylaxis: apixaban (ELIQUIS) tablet 2.5 mg Start: 09/21/20 0800 apixaban (ELIQUIS) tablet 2.5 mg  Code Status: FULL Family Communication: wife, all history collected via urdu interpreter        Subjective: Still wheezing, no fever, no confusion, no vomiting.  Objective: Vitals:   09/22/20 2028 09/22/20 2333 09/23/20 0500 09/23/20 0508  BP: 127/78 107/65  128/87  Pulse: 98 (!) 108  (!) 104  Resp: '17 17  17  '$ Temp: 98.3 F (36.8 C) 98 F (36.7 C)  97.6 F (36.4 C)  TempSrc:      SpO2: 99% 97%  97%  Weight:   91 kg     Intake/Output Summary (Last 24 hours) at 09/23/2020 1655 Last data filed at 09/23/2020 1023 Gross per 24 hour  Intake 700 ml  Output 300 ml  Net 400 ml   Filed Weights   08/29/2020 2245 09/22/20 0103 09/23/20 0500  Weight: 90 kg 90 kg 91 kg    Examination: General appearance:  adult male, alert and in mild respiratory distress.   HEENT: Anicteric, conjunctiva pink, lids and lashes normal. No nasal deformity, discharge, epistaxis.  Lips moist, oropharynx tacky dry.   Skin: Warm and dry.  no jaundice.  No suspicious rashes or lesions. Cardiac: RRR, nl S1-S2, no murmurs appreciated.  Capillary refill is brisk no lower extremity edema Respiratory: Normal respiratory rate and rhythm.  Wheezing bilaterally. Abdomen: Abdomen soft.  no TTP. No ascites, distension, hepatosplenomegaly.   MSK: No deformities or effusions. Neuro: Awake sluggish.  EOMI, moves all extremities, generalized weakness. Speech seems fluent in Urdu.    Psych: Seems to make appropriate responses to wife to simple questions, keeps eyes closed, attention diminished, affect blunted, judgment insight appear impaired     Data Reviewed: I have personally reviewed following labs and imaging studies:  CBC: Recent Labs  Lab 08/27/2020 2128 09/21/20 0344 09/22/20 0500 09/22/20 1944 09/23/20 0448  WBC 3.5* 3.4* 2.8*   --  5.4  NEUTROABS 2.0  --  2.4  --  4.8  HGB 7.0* 7.0* 6.6* 7.9* 8.1*  HCT 22.9* 23.0* 21.9* 24.6* 24.8*  MCV 92.0 92.4 90.9  --  87.9  PLT 131* 128* 131*  --  [id number]   Basic Metabolic Panel: Recent Labs  Lab 09/04/2020 2128 09/21/20 0344 09/21/20 1305 09/22/20 0500 09/23/20 0448  NA 135 133*  --  133* 133*  K 3.2* 3.0*  --  4.0 4.9  CL 94* 92*  --  94* 93*  CO2 32 30  --  29 29  GLUCOSE 148* 249*  --  230* 161*  BUN 69* 69*  --  77* 91*  CREATININE 1.75* 1.69*  --  2.11* 2.66*  CALCIUM 8.4* 8.3*  --  8.3* 8.4*  MG  --   --  2.3 2.3 2.5*   GFR: Estimated Creatinine Clearance: 23.4 mL/min (A) (by C-G formula based on SCr of 2.66 mg/dL (H)). Liver Function Tests: Recent Labs  Lab 09/14/2020 2128 09/22/20 0500 09/23/20 0448  AST 35 33 30  ALT '20 18 19  '$ ALKPHOS 73 66 62  BILITOT 0.8 0.8 1.5*  PROT 6.9 6.6 6.9  ALBUMIN 3.2* 3.1* 3.3*   No results for input(s): LIPASE, AMYLASE in the last 168 hours. No results for input(s): AMMONIA in the last 168 hours. Coagulation Profile: Recent Labs  Lab 09/09/2020 2128  INR 1.7*   Cardiac Enzymes: No results for input(s): CKTOTAL, CKMB, CKMBINDEX, TROPONINI in the last 168 hours. BNP (last 3 results) No results for input(s): PROBNP in the last 8760 hours. HbA1C: Recent Labs    09/22/20 0500  HGBA1C 6.6*   CBG: Recent Labs  Lab 09/21/20 2154 09/22/20 0854 09/22/20 1300 09/22/20 1725 09/22/20 2039  GLUCAP 271* 232* 241* 186* 202*   Lipid Profile: No results for input(s): CHOL, HDL, LDLCALC, TRIG, CHOLHDL, LDLDIRECT in the last 72 hours. Thyroid Function Tests: Recent Labs    09/22/20 0500  TSH 0.710   Anemia Panel: Recent Labs    09/22/20 0500 09/23/20 0448  FERRITIN 19* 21*   Urine analysis:    Component Value Date/Time   COLORURINE YELLOW (A) 09/08/2020 2128   APPEARANCEUR CLEAR (A) 08/24/2020 2128   APPEARANCEUR Clear 07/17/2018 1601   LABSPEC 1.015 09/06/2020 2128   LABSPEC 1.004 05/06/2014 2100    PHURINE 5.0 09/06/2020 2128   GLUCOSEU NEGATIVE 09/11/2020 2128   GLUCOSEU Negative 05/06/2014 2100   HGBUR NEGATIVE 09/10/2020 2128   BILIRUBINUR NEGATIVE 09/08/2020 2128   BILIRUBINUR Negative 07/17/2018 1601   BILIRUBINUR Negative 05/06/2014 2100   KETONESUR NEGATIVE 08/31/2020 2128   PROTEINUR NEGATIVE 09/21/2020 2128   NITRITE NEGATIVE 09/13/2020 2128   LEUKOCYTESUR NEGATIVE 09/05/2020 2128   LEUKOCYTESUR Negative 05/06/2014 2100   Sepsis Labs: '@LABRCNTIP'$ (procalcitonin:4,lacticacidven:4)  ) Recent Results (from the past 240 hour(s))  Urine Culture     Status: Abnormal   Collection Time: 09/21/2020  9:28 PM   Specimen: Urine, Random  Result Value Ref Range Status   Specimen Description   Final    URINE, RANDOM Performed at [facility], [address] [place], [place] [address]    Special Requests   Final    NONE Performed at [facility], [address] [place], [place] [address]    Culture (A)  Final    <10,000 COLONIES/mL INSIGNIFICANT GROWTH Performed at [facility] [address] [place], [place] [address]    Report Status 09/23/2020 FINAL  Final  Blood Culture (routine x 2)     Status: None (Preliminary result)   Collection Time: 09/03/2020  9:28 PM   Specimen: BLOOD  Result Value Ref Range Status   Specimen Description BLOOD BLOOD RIGHT FOREARM  Final   Special Requests   Final    BOTTLES DRAWN AEROBIC AND ANAEROBIC Blood Culture results may not be optimal due to an inadequate volume of blood received in culture bottles   Culture   Final    NO GROWTH 3 DAYS Performed at [facility], [address], [place], [place] [address]    Report Status PENDING  Incomplete  Blood Culture (routine x 2)     Status: None (Preliminary result)   Collection Time: 09/22/2020  9:28 PM   Specimen: BLOOD  Result Value Ref Range Status   Specimen Description BLOOD RIGHT ANTECUBITAL  Final   Special Requests   Final    BOTTLES DRAWN AEROBIC  AND ANAEROBIC Blood Culture adequate volume   Culture   Final    NO GROWTH 3 DAYS Performed at [facility], [address] [place], [place] [address]    Report Status PENDING  Incomplete  Resp Panel by RT-PCR (Flu A&B, Covid) Nasopharyngeal Swab     Status: Abnormal   Collection Time: 08/24/2020  9:28 PM   Specimen: Nasopharyngeal Swab; Nasopharyngeal(NP) swabs in vial transport medium  Result Value Ref Range Status   SARS Coronavirus 2 by RT PCR POSITIVE (A) NEGATIVE Final    Comment: RESULT CALLED TO, READ BACK BY AND VERIFIED WITH: MELISSA HOUP'@2253'$  09/01/2020 RH (NOTE) SARS-CoV-2 target nucleic acids are DETECTED.  The SARS-CoV-2 RNA is generally detectable in upper respiratory specimens during the acute phase of infection. Positive results are indicative of the presence of the identified virus, but do not rule out bacterial infection or co-infection with other pathogens not detected by the test. Clinical correlation with patient history and other diagnostic information is necessary to determine patient infection status. The expected result is Negative.  Fact Sheet for Patients: EntrepreneurPulse.com.au  Fact Sheet for Healthcare Providers: IncredibleEmployment.be  This test is not yet approved or cleared by the [place] FDA and  has been authorized for detection and/or diagnosis of SARS-CoV-2 by FDA under an Emergency Use Authorization (EUA).  This EUA will remain in effect (meaning this test can be used ) for the duration of  the COVID-19 declaration under Section 564(b)(1) of the Act, 21 U.S.C. section 360bbb-3(b)(1), unless the authorization is terminated or revoked sooner.     Influenza A by PCR NEGATIVE NEGATIVE Final   Influenza B by PCR NEGATIVE NEGATIVE Final    Comment: (NOTE) The Xpert Xpress SARS-CoV-2/FLU/RSV plus assay is intended as an aid in the diagnosis of influenza from Nasopharyngeal swab specimens  and should not be used as a sole basis for treatment. Nasal washings and aspirates are unacceptable for Xpert Xpress SARS-CoV-2/FLU/RSV testing.  Fact Sheet for  Patients: EntrepreneurPulse.com.au  Fact Sheet for Healthcare Providers: IncredibleEmployment.be  This test is not yet approved or cleared by the [place] FDA and has been authorized for detection and/or diagnosis of SARS-CoV-2 by FDA under an Emergency Use Authorization (EUA). This EUA will remain in effect (meaning this test can be used) for the duration of the COVID-19 declaration under Section 564(b)(1) of the Act, 21 U.S.C. section 360bbb-3(b)(1), unless the authorization is terminated or revoked.  Performed at [facility], [address], [place], [place] [address]          Radiology Studies: No results found.      Scheduled Meds:  sodium chloride   Intravenous Once   apixaban  2.5 mg Oral BID   vitamin C  500 mg Oral Daily   atorvastatin  40 mg Oral q1800   cholecalciferol  1,000 Units Oral Daily   diltiazem  60 mg Oral Q8H   donepezil  5 mg Oral QHS   DULoxetine  60 mg Oral Daily   finasteride  5 mg Oral Daily   gabapentin  100 mg Oral BID   guaiFENesin  600 mg Oral BID   insulin aspart  0-9 Units Subcutaneous TID WC   insulin aspart  4 Units Subcutaneous TID WC   insulin glargine-yfgn  30 Units Subcutaneous QHS   Ipratropium-Albuterol  2 puff Inhalation QID   latanoprost  1 drop Both Eyes QHS   levothyroxine  88 mcg Oral Q0600   loratadine  10 mg Oral Daily   methylPREDNISolone (SOLU-MEDROL) injection  40 mg Intravenous Q12H   metoprolol tartrate  25 mg Oral BID   mometasone-formoterol  2 puff Inhalation BID   omega-3 acid ethyl esters  1 g Oral Daily   pantoprazole  40 mg Oral Daily   sildenafil  20 mg Oral TID   tamsulosin  0.4 mg Oral Daily   tiotropium  18 mcg Inhalation Daily   Vitamin D (Ergocalciferol)  50,000 Units Oral Weekly    zinc sulfate  220 mg Oral Daily   Continuous Infusions:  remdesivir 100 mg in NS 100 mL 100 mg (09/23/20 1023)     LOS: 3 days    Time spent: 35 minutes    Edwin Dada, MD [facility] 09/23/2020, 4:55 PM     Please page though [employer] or Epic secure chat:  For [employer], Adult nurse

## 2020-09-23 NOTE — Progress Notes (Signed)
[facility] Cardiology  SUBJECTIVE: Patient laying in flat in bed, denies chest pain, reports shortness of breath, on 2 L nasal cannula   Vitals:   09/22/20 2028 09/22/20 2333 09/23/20 0500 09/23/20 0508  BP: 127/78 107/65  128/87  Pulse: 98 (!) 108  (!) 104  Resp: '17 17  17  '$ Temp: 98.3 F (36.8 C) 98 F (36.7 C)  97.6 F (36.4 C)  TempSrc:      SpO2: 99% 97%  97%  Weight:   91 kg      Intake/Output Summary (Last 24 hours) at 09/23/2020 1004 Last data filed at 09/23/2020 0500 Gross per 24 hour  Intake 600 ml  Output 300 ml  Net 300 ml      PHYSICAL EXAM  General: Well developed, well nourished, in no acute distress HEENT:  Normocephalic and atramatic Neck:  No JVD.  Lungs: Clear bilaterally to auscultation and percussion. Heart: HRRR . Normal S1 and S2 without gallops or murmurs.  Abdomen: Bowel sounds are positive, abdomen soft and non-tender  Msk:  Back normal, normal gait. Normal strength and tone for age. Extremities: No clubbing, cyanosis or edema.   Neuro: Alert and oriented X 3. Psych:  Good affect, responds appropriately   LABS: Basic Metabolic Panel: Recent Labs    09/22/20 0500 09/23/20 0448  NA 133* 133*  K 4.0 4.9  CL 94* 93*  CO2 29 29  GLUCOSE 230* 161*  BUN 77* 91*  CREATININE 2.11* 2.66*  CALCIUM 8.3* 8.4*  MG 2.3 2.5*   Liver Function Tests: Recent Labs    09/22/20 0500 09/23/20 0448  AST 33 30  ALT 18 19  ALKPHOS 66 62  BILITOT 0.8 1.5*  PROT 6.6 6.9  ALBUMIN 3.1* 3.3*   No results for input(s): LIPASE, AMYLASE in the last 72 hours. CBC: Recent Labs    09/22/20 0500 09/22/20 1944 09/23/20 0448  WBC 2.8*  --  5.4  NEUTROABS 2.4  --  4.8  HGB 6.6* 7.9* 8.1*  HCT 21.9* 24.6* 24.8*  MCV 90.9  --  87.9  PLT 131*  --  151   Cardiac Enzymes: No results for input(s): CKTOTAL, CKMB, CKMBINDEX, TROPONINI in the last 72 hours. BNP: Invalid input(s): POCBNP D-Dimer: Recent Labs    09/22/20 0500 09/23/20 0448  DDIMER <0.27 <0.27    Hemoglobin A1C: Recent Labs    09/22/20 0500  HGBA1C 6.6*   Fasting Lipid Panel: No results for input(s): CHOL, HDL, LDLCALC, TRIG, CHOLHDL, LDLDIRECT in the last 72 hours. Thyroid Function Tests: Recent Labs    09/22/20 0500  TSH 0.710   Anemia Panel: Recent Labs    09/23/20 0448  FERRITIN 21*    No results found.   Echo LVEF greater than 55% by 2D echocardiogram 07/17/2020  TELEMETRY: Atrial fibrillation 102 bpm:  ASSESSMENT AND PLAN:  Principal Problem:   COVID-19 virus infection Active Problems:   Bradycardia   Type 2 diabetes mellitus without complication (HCC)   OSA (obstructive sleep apnea)   Atrial fibrillation with RVR (HCC)   Esophageal reflux   Essential hypertension   Spinal stenosis of lumbar region   BPH (benign prostatic hyperplasia)   Dementia without behavioral disturbance (HCC)   Acute on chronic diastolic CHF (congestive heart failure) (HCC)   PAH (pulmonary artery hypertension) ([place])   History of CVA (cerebrovascular accident)   Chronic kidney disease, stage 3a (HCC)   COPD (chronic obstructive pulmonary disease) (HCC)   Chronic respiratory failure with hypoxia ([place])  1. Respiratory failure, multifactorial, secondary to acute on chronic diastolic congestive heart failure, COPD exacerbation, with underlying COVID-19 infection, slowly improving, oxygen saturation 97% on 2 L nasal cannula 2.  Atrial fibrillation with rapid ventricular rate, compensatory for respiratory failure, COVID-19 infection and anemia, on p.o. metoprolol tartrate and diltiazem for rate control, and Eliquis for stroke prevention, rate improved, near baseline 100 bpm 3.  Chronic diastolic congestive heart failure, LVEF greater than 55% by 2D echocardiogram 07/17/2020 4.  COPD exacerbation 5.  COVID-19 infection, on IV remdesivir 6.  Chronic kidney disease, worsening BUN and creatinine 91 and 2.66 with GFR 23 7.  Anemia, hemoglobin adequate 7.0 and 23.0, respectively 8.   Pulmonary hypertension, on sildenafil   Recommendations   1.  Agree with overall current therapy 2.  Agree holding furosemide 3.  Continue metoprolol to tartrate 25 mg twice daily, diltiazem 60 mg every 8 hours for now, transition diltiazem to long-acting diltiazem 120-180 milligrams daily prior to discharge 4.  Continue low-dose Eliquis for stroke prevention   Isaias Cowman, MD, PhD, [facility] 09/23/2020 10:04 AM

## 2020-09-24 ENCOUNTER — Inpatient Hospital Stay: Payer: Medicare Other

## 2020-09-24 DIAGNOSIS — U071 COVID-19: Secondary | ICD-10-CM | POA: Diagnosis not present

## 2020-09-24 LAB — CBC WITH DIFFERENTIAL/PLATELET
Abs Immature Granulocytes: 0.05 10*3/uL (ref 0.00–0.07)
Basophils Absolute: 0 10*3/uL (ref 0.0–0.1)
Basophils Relative: 0 %
Eosinophils Absolute: 0 10*3/uL (ref 0.0–0.5)
Eosinophils Relative: 0 %
HCT: 24.3 % — ABNORMAL LOW (ref 39.0–52.0)
Hemoglobin: 7.8 g/dL — ABNORMAL LOW (ref 13.0–17.0)
Immature Granulocytes: 1 %
Lymphocytes Relative: 4 %
Lymphs Abs: 0.3 10*3/uL — ABNORMAL LOW (ref 0.7–4.0)
MCH: 28.4 pg (ref 26.0–34.0)
MCHC: 32.1 g/dL (ref 30.0–36.0)
MCV: 88.4 fL (ref 80.0–100.0)
Monocytes Absolute: 0.2 10*3/uL (ref 0.1–1.0)
Monocytes Relative: 3 %
Neutro Abs: 6.2 10*3/uL (ref 1.7–7.7)
Neutrophils Relative %: 92 %
Platelets: 153 10*3/uL (ref 150–400)
RBC: 2.75 MIL/uL — ABNORMAL LOW (ref 4.22–5.81)
RDW: 15.7 % — ABNORMAL HIGH (ref 11.5–15.5)
WBC: 6.8 10*3/uL (ref 4.0–10.5)
nRBC: 0 % (ref 0.0–0.2)

## 2020-09-24 LAB — MAGNESIUM
Magnesium: 2.5 mg/dL — ABNORMAL HIGH (ref 1.7–2.4)
Magnesium: 2.5 mg/dL — ABNORMAL HIGH (ref 1.7–2.4)

## 2020-09-24 LAB — BLOOD GAS, ARTERIAL
Acid-Base Excess: 3.2 mmol/L — ABNORMAL HIGH (ref 0.0–2.0)
Acid-Base Excess: 3.6 mmol/L — ABNORMAL HIGH (ref 0.0–2.0)
Allens test (pass/fail): POSITIVE — AB
Allens test (pass/fail): POSITIVE — AB
Bicarbonate: 29.3 mmol/L — ABNORMAL HIGH (ref 20.0–28.0)
Bicarbonate: 28.5 mmol/L — ABNORMAL HIGH (ref 20.0–28.0)
FIO2: 0.28
FIO2: 0.32
O2 Saturation: 95.3 %
O2 Saturation: 95.5 %
Patient temperature: 37
Patient temperature: 37
pCO2 arterial: 53 mmHg — ABNORMAL HIGH (ref 32.0–48.0)
pCO2 arterial: 44 mmHg (ref 32.0–48.0)
pH, Arterial: 7.35 (ref 7.350–7.450)
pH, Arterial: 7.42 (ref 7.350–7.450)
pO2, Arterial: 81 mmHg — ABNORMAL LOW (ref 83.0–108.0)
pO2, Arterial: 77 mmHg — ABNORMAL LOW (ref 83.0–108.0)

## 2020-09-24 LAB — COMPREHENSIVE METABOLIC PANEL
ALT: 21 U/L (ref 0–44)
AST: 30 U/L (ref 15–41)
Albumin: 3.2 g/dL — ABNORMAL LOW (ref 3.5–5.0)
Alkaline Phosphatase: 56 U/L (ref 38–126)
Anion gap: 13 (ref 5–15)
BUN: 113 mg/dL — ABNORMAL HIGH (ref 8–23)
CO2: 27 mmol/L (ref 22–32)
Calcium: 8.4 mg/dL — ABNORMAL LOW (ref 8.9–10.3)
Chloride: 90 mmol/L — ABNORMAL LOW (ref 98–111)
Creatinine, Ser: 2.86 mg/dL — ABNORMAL HIGH (ref 0.61–1.24)
GFR, Estimated: 21 mL/min — ABNORMAL LOW (ref >60)
Glucose, Bld: 124 mg/dL — ABNORMAL HIGH (ref 70–99)
Potassium: 5.4 mmol/L — ABNORMAL HIGH (ref 3.5–5.1)
Sodium: 130 mmol/L — ABNORMAL LOW (ref 135–145)
Total Bilirubin: 1 mg/dL (ref 0.3–1.2)
Total Protein: 6.6 g/dL (ref 6.5–8.1)

## 2020-09-24 LAB — CBC
HCT: 21.8 % — ABNORMAL LOW (ref 39.0–52.0)
Hemoglobin: 6.9 g/dL — ABNORMAL LOW (ref 13.0–17.0)
MCH: 29.1 pg (ref 26.0–34.0)
MCHC: 31.7 g/dL (ref 30.0–36.0)
MCV: 92 fL (ref 80.0–100.0)
Platelets: 125 10*3/uL — ABNORMAL LOW (ref 150–400)
RBC: 2.37 MIL/uL — ABNORMAL LOW (ref 4.22–5.81)
RDW: 15.6 % — ABNORMAL HIGH (ref 11.5–15.5)
WBC: 5.5 10*3/uL (ref 4.0–10.5)
nRBC: 0 % (ref 0.0–0.2)

## 2020-09-24 LAB — BASIC METABOLIC PANEL
Anion gap: 9 (ref 5–15)
BUN: 123 mg/dL — ABNORMAL HIGH (ref 8–23)
CO2: 29 mmol/L (ref 22–32)
Calcium: 8.1 mg/dL — ABNORMAL LOW (ref 8.9–10.3)
Chloride: 93 mmol/L — ABNORMAL LOW (ref 98–111)
Creatinine, Ser: 2.94 mg/dL — ABNORMAL HIGH (ref 0.61–1.24)
GFR, Estimated: 21 mL/min — ABNORMAL LOW (ref >60)
Glucose, Bld: 114 mg/dL — ABNORMAL HIGH (ref 70–99)
Potassium: 4.9 mmol/L (ref 3.5–5.1)
Sodium: 131 mmol/L — ABNORMAL LOW (ref 135–145)

## 2020-09-24 LAB — D-DIMER, QUANTITATIVE: D-Dimer, Quant: <0.27 ug/mL-FEU (ref 0.00–0.50)

## 2020-09-24 LAB — GLUCOSE, CAPILLARY
Glucose-Capillary: 82 mg/dL (ref 70–99)
Glucose-Capillary: 137 mg/dL — ABNORMAL HIGH (ref 70–99)
Glucose-Capillary: 125 mg/dL — ABNORMAL HIGH (ref 70–99)
Glucose-Capillary: 115 mg/dL — ABNORMAL HIGH (ref 70–99)
Glucose-Capillary: 104 mg/dL — ABNORMAL HIGH (ref 70–99)
Glucose-Capillary: 111 mg/dL — ABNORMAL HIGH (ref 70–99)

## 2020-09-24 LAB — MRSA NEXT GEN BY PCR, NASAL: MRSA by PCR Next Gen: NOT DETECTED

## 2020-09-24 LAB — BRAIN NATRIURETIC PEPTIDE: B Natriuretic Peptide: 280.1 pg/mL — ABNORMAL HIGH (ref 0.0–100.0)

## 2020-09-24 LAB — C-REACTIVE PROTEIN: CRP: 0.5 mg/dL (ref <1.0)

## 2020-09-24 LAB — FERRITIN: Ferritin: 24 ng/mL (ref 24–336)

## 2020-09-24 MED ORDER — BISACODYL 5 MG PO TBEC
5.0000 mg | DELAYED_RELEASE_TABLET | Freq: Once | ORAL | Status: AC
  Administered 2020-09-24: 5 mg via ORAL
  Filled 2020-09-24: qty 1

## 2020-09-24 MED ORDER — LACTATED RINGERS IV SOLN: INTRAVENOUS | Status: DC

## 2020-09-24 MED ORDER — SODIUM CHLORIDE 0.9% IV SOLUTION: Freq: Once | INTRAVENOUS | Status: AC

## 2020-09-24 MED ORDER — MIDAZOLAM HCL 2 MG/2ML IJ SOLN
INTRAMUSCULAR | Status: AC
  Administered 2020-09-24: 3 mg via INTRAVENOUS
  Filled 2020-09-24: qty 2

## 2020-09-24 MED ORDER — MIDAZOLAM HCL 2 MG/2ML IJ SOLN: 3.0000 mg | Freq: Once | INTRAMUSCULAR | Status: AC

## 2020-09-24 MED ORDER — NOREPINEPHRINE 4 MG/250ML-% IV SOLN
INTRAVENOUS | Status: AC
  Filled 2020-09-24: qty 250

## 2020-09-24 MED ORDER — CHLORHEXIDINE GLUCONATE CLOTH 2 % EX PADS
6.0000 | MEDICATED_PAD | Freq: Every day | CUTANEOUS | Status: DC
  Administered 2020-09-24 – 2020-09-29 (×6): 6 via TOPICAL

## 2020-09-24 MED ORDER — BISACODYL 5 MG PO TBEC: 5.0000 mg | DELAYED_RELEASE_TABLET | Freq: Every day | ORAL | Status: DC

## 2020-09-24 MED ORDER — INSULIN GLARGINE-YFGN 100 UNIT/ML ~~LOC~~ SOLN
24.0000 [IU] | Freq: Every day | SUBCUTANEOUS | Status: DC
  Administered 2020-09-25 – 2020-09-28 (×4): 24 [IU] via SUBCUTANEOUS
  Filled 2020-09-24 (×6): qty 0.24

## 2020-09-24 MED ORDER — MIDAZOLAM HCL 2 MG/2ML IJ SOLN
INTRAMUSCULAR | Status: AC
  Filled 2020-09-24: qty 2

## 2020-09-24 MED ORDER — SODIUM CHLORIDE 0.9 % IV SOLN: INTRAVENOUS | Status: AC

## 2020-09-24 MED ORDER — LACTULOSE 10 GM/15ML PO SOLN
20.0000 g | Freq: Once | ORAL | Status: AC
  Administered 2020-09-24: 20 g via ORAL
  Filled 2020-09-24: qty 30

## 2020-09-24 NOTE — Procedures (Signed)
Central Venous Catheter Insertion Procedure Note  Derek Shelton  PS:[id number]  11/16/1940  Date:09/24/20  Time:10:53 PM   Provider Performing:Jonae Renshaw A Ilena Dieckman   Procedure: Insertion of Non-tunneled Central Venous [phone]) with US guidance [id number])   Indication(s) Medication administration, Difficult access, and Hemodialysis  Consent Risks of the procedure as well as the alternatives and risks of each were explained to the patient and/or caregiver.  Consent for the procedure was obtained and is signed in the bedside chart  Anesthesia Topical only with 1% lidocaine   Timeout Verified patient identification, verified procedure, site/side was marked, verified correct patient position, special equipment/implants available, medications/allergies/relevant history reviewed, required imaging and test results available.  Sterile Technique Maximal sterile technique including full sterile barrier drape, hand hygiene, sterile gown, sterile gloves, mask, hair covering, sterile ultrasound probe cover (if used).  Procedure Description Area of catheter insertion was cleaned with chlorhexidine and draped in sterile fashion.  With real-time ultrasound guidance a central venous catheter was placed into the right femoral vein. Nonpulsatile blood flow and easy flushing noted in all ports.  The catheter was sutured in place and sterile dressing applied.  Complications/Tolerance None; patient tolerated the procedure well. Chest X-ray is ordered to verify placement for internal jugular or subclavian cannulation.   Chest x-ray is not ordered for femoral cannulation.  EBL Minimal  Specimen(s) None  Rufina Falco, DNP, CCRN, FNP-C, AGACNP-BC Acute Care Nurse Practitioner  [facility] Pager: [phone] [place] at [facility]

## 2020-09-24 NOTE — Progress Notes (Signed)
Patient has coarse crackles scattered throughout all lung fields. IVF infusing at 63m/hr. Order questioned. Awaiting further orders.

## 2020-09-24 NOTE — Progress Notes (Signed)
Patient has remained somnolent today despite fluids, bronchodilators, aggressive RT.  This evening, BP softer.  Discussed with CCM, will transfer to stepdown.

## 2020-09-24 NOTE — Plan of Care (Signed)
  Problem: Education: Goal: Knowledge of General Education information will improve Description: Including pain rating scale, medication(s)/side effects and non-pharmacologic comfort measures 09/24/2020 2358 by Patrecia Pace, RN Outcome: Not Progressing 09/24/2020 2358 by Patrecia Pace, RN Outcome: Progressing   Problem: Health Behavior/Discharge Planning: Goal: Ability to manage health-related needs will improve 09/24/2020 2358 by Patrecia Pace, RN Outcome: Not Progressing 09/24/2020 2358 by Patrecia Pace, RN Outcome: Progressing   Problem: Clinical Measurements: Goal: Ability to maintain clinical measurements within normal limits will improve 09/24/2020 2358 by Patrecia Pace, RN Outcome: Not Progressing 09/24/2020 2358 by Patrecia Pace, RN Outcome: Progressing Goal: Will remain free from infection 09/24/2020 2358 by Patrecia Pace, RN Outcome: Not Progressing 09/24/2020 2358 by Patrecia Pace, RN Outcome: Progressing Goal: Diagnostic test results will improve 09/24/2020 2358 by Patrecia Pace, RN Outcome: Not Progressing 09/24/2020 2358 by Patrecia Pace, RN Outcome: Progressing Goal: Respiratory complications will improve 09/24/2020 2358 by Patrecia Pace, RN Outcome: Not Progressing 09/24/2020 2358 by Patrecia Pace, RN Outcome: Progressing Goal: Cardiovascular complication will be avoided 09/24/2020 2358 by Patrecia Pace, RN Outcome: Not Progressing 09/24/2020 2358 by Patrecia Pace, RN Outcome: Progressing   Problem: Activity: Goal: Risk for activity intolerance will decrease 09/24/2020 2358 by Patrecia Pace, RN Outcome: Not Progressing 09/24/2020 2358 by Patrecia Pace, RN Outcome: Progressing   Problem: Nutrition: Goal: Adequate nutrition will be maintained 09/24/2020 2358 by Patrecia Pace, RN Outcome: Not Progressing 09/24/2020 2358 by Patrecia Pace, RN Outcome: Progressing   Problem: Coping: Goal: Level of anxiety will  decrease 09/24/2020 2358 by Patrecia Pace, RN Outcome: Not Progressing 09/24/2020 2358 by Patrecia Pace, RN Outcome: Progressing   Problem: Elimination: Goal: Will not experience complications related to bowel motility 09/24/2020 2358 by Patrecia Pace, RN Outcome: Not Progressing 09/24/2020 2358 by Patrecia Pace, RN Outcome: Progressing Goal: Will not experience complications related to urinary retention 09/24/2020 2358 by Patrecia Pace, RN Outcome: Not Progressing 09/24/2020 2358 by Patrecia Pace, RN Outcome: Progressing   Problem: Pain Managment: Goal: General experience of comfort will improve 09/24/2020 2358 by Patrecia Pace, RN Outcome: Not Progressing 09/24/2020 2358 by Patrecia Pace, RN Outcome: Progressing   Problem: Safety: Goal: Ability to remain free from injury will improve 09/24/2020 2358 by Patrecia Pace, RN Outcome: Not Progressing 09/24/2020 2358 by Patrecia Pace, RN Outcome: Progressing   Problem: Skin Integrity: Goal: Risk for impaired skin integrity will decrease 09/24/2020 2358 by Patrecia Pace, RN Outcome: Not Progressing 09/24/2020 2358 by Patrecia Pace, RN Outcome: Progressing

## 2020-09-24 NOTE — Consult Note (Signed)
NAME:  Derek Shelton, MRN:  [id number], DOB:  01-Feb-1940, LOS: 4 ADMISSION DATE:  08/30/2020, CONSULTATION DATE:  09/24/2020 REFERRING MD:  Dr. Loleta Books, CHIEF COMPLAINT:  Altered mental status, Acute on Chronic Hypoxic Respiratory Failure   Brief Pt Description / Synopsis:  80 year old male with a past medical history significant for COPD with FEV1 54% requiring 2 L home O2 at baseline, HFpEF, obesity, diabetes mellitus, coronary artery disease, hypertension, and hypothyroidism admitted with acute on chronic hypoxic respiratory failure in the setting of acute COPD exacerbation and COVID-19 infection.  Course complicated by Acute Metabolic Encephalopathy in setting of AKI and Uremia requiring Dialysis.  History of Present Illness:  Derek Shelton is a 80 year old male (Pakistani speaking only) with a past medical history significant for COPD with FEV1 54% requiring 2 L supplemental O2 at home baseline, HFpEF, coronary artery disease, diabetes mellitus type 2, hypertension, dyslipidemia, and hypothyroidism who presented to [facility] ED on 09/24/2020 shortness of breath, productive cough, and wheezing.  He also endorsed nausea and poor appetite.  He denied vomiting, diarrhea, chest pain, palpitations, loss of taste or smell, dysuria.  Of note he did test positive for COVID with a home antigen test.  ED course: Vital signs: Pulse 120, respiratory rate 28, SPO2 88% on room air Labs: BUN 69, creatinine 1.75, BNP 288.7 COVID-19 PCR positive EKG with atrial fib with RVR and right bundle branch block Chest x-ray with cardiomegaly and central vascular congestion  He was admitted by the hospitalist for further work-up and treatment of acute on chronic hypoxic respiratory failure in the setting of acute COPD repeat x-ray showed COVID-19 infection, and acute decompensated HFpEF.  Cardiology was consulted.  Hospital course: On 09/24/2020, creatinine worsened to which diuretics were held and was given IV fluids..  He  was also noted to be more somnolent/lethargic with soft blood pressures.  He is being transferred to the stepdown unit for further work-up and treatment.  PCCM is consulted for further assistance with management of Acute Metabolic Encephalopathy, suspect secondary to Uremia.  Discussed with Nephrology, will plan for placement of temporary dialysis catheter placement and Dialysis.  Pertinent  Medical History  COPD on 2 L supplemental oxygen at baseline HFpEF Coronary artery disease Hypertension Hyperlipidemia Obesity Sleep apnea Dementia GERD GI bleed Hypothyroidism   Micro Data:  08/30/2020: SARS-CoV-2 PCR>> positive 09/01/2020: Influenza PCR>> negative 09/18/2020: Blood culture x2>> no growth to date  Antimicrobials:  Ceftriaxone 8/29 x1 dose Azithromycin 8/31>> Remdesivir 8/29>>9/1  Significant Hospital Events: Including procedures, antibiotic start and stop dates in addition to other pertinent events   09/24/20: Creatinine worsening, diuretics held. Pt with lethargy, transfer to ICU.  High risk for intubation.  Nephrology consulted for severe uremia.  Plan to place temporary dialysis catheter in anticipation of need for dialysis.  Interim History / Subjective:  -Worsening creatinine, diuretics held today and IV fluids given -Lethargic/somnolent today, transfer to Stepdown with PCCM consultation -High risk for intubation -Suspect encephalopathy is secondary to uremia, discussed with nephrology, plan to place temporary dialysis catheter and initiate hemodialysis -Discussed with patient's son Derek Shelton, who gives consent for placement of dialysis catheter  Objective   Blood pressure 91/62, pulse (!) 102, temperature 98.5 F (36.9 C), temperature source Oral, resp. rate 16, weight 94.1 kg, SpO2 97 %.        Intake/Output Summary (Last 24 hours) at 09/24/2020 1648 Last data filed at 09/24/2020 0447 Gross per 24 hour  Intake 340 ml  Output 600 ml  Net -260  ml   Filed Weights    09/22/20 0103 09/23/20 0500 09/24/20 0447  Weight: 90 kg 91 kg 94.1 kg    Examination: General: Acute on chronically ill-appearing male, laying in bed, somnolent, in no acute distress HENT: Atraumatic, normocephalic, neck supple Lungs: Diminished breath sounds bilaterally with expiratory wheezing in bilateral upper fields, even, nonlabored Cardiovascular: Tachycardia, irregular irregular rhythm (A. fib on telemetry), Abdomen: Obese, soft, nontender, nondistended, no guarding rebound tenderness, bowel sounds positive x4 Extremities: Normal bulk and tone, no deformities Neuro: Somnolent, withdraws from pain, pupils PERRLA, action myoclonus of the face GU: External catheter in place  Resolved Hospital Problem list     Assessment & Plan:   Acute on Chronic Hypoxic Respiratory Failure in the setting of AECOPD, COVID-19 Infection, and suspected Acute Decompensated HFpEF PMHx of COPD on 2L supplemental O2 at baseline -Supplemental O2 as needed to maintain O2 sats >88% -High risk for intubation (avoid BiPAP in setting of altered mental status) -Follow intermittent Chest X-ray and ABG as needed -Diuresis as blood pressure and renal function permits ~ Lasix currently on hold due to worsening Creatinine and soft blood pressure -Remdesivir, plan for 5 days -IV Steroids -Continue scheduled and PRN bronchodilators via MDI -Continue Azithromycin -Follow inflammatory markers: Ferritin, D-dimer, CRP -Assess for possible tocilizumab  -Vitamin C, zinc  Acute on Chronic HFpEF Paroxsymal Atrial fibrillation A. Fib w/ RVR on admission -Continuous cardiac monitoring -Maintain MAP >65 -Vasopressors as needed to maintain MAP goal -Cardiology following, appreciate input -Diuresis as BP and renal function permits ~ Lasix currently on hold due to worsening Creatinine -Continue Apixaban, Cardizem, Metoprolol -Echocardiogram from 07/17/20 with LVEF >55%, unable to evaluate diastolic parameters, increased  pulmonary artery systolic pressure, and moderate to severe tricuspid valve regurgitation  AKI on CKD Stage IIIa (baseline Cr. 1.5) Hyperkalemia Hyponatremia -Monitor I&O's / urinary output -Follow BMP -Ensure adequate renal perfusion -Avoid nephrotoxic agents as able -Replace electrolytes as indicated -Nephrology consulted, appreciate input -Discussed with Dr. Candiss Norse, patient will need hemodialysis, plan to place temporary dialysis catheter  Acute Metabolic Encephalopathy, suspect secondary to Uremia -Provide supportive care -Avoid sedating meds able -ABG without significant Hypercapnia (pH 7.24/pCO2 44/pO2 77/Bicarb 28.5) -Discussed with nephrology, plan to start hemodialysis  Anemia of Chronic Disease -Monitor for S/Sx of bleeding -Trend CBC -Continue Eliquis for Anticoagulation/VTE Prophylaxis  -Transfuse for Hgb <7  Diabetes Mellitus -CBG's q4h; Target range of 140 to 180 -SSI -Follow ICU Hypo/Hyperglycemia protocol      Best Practice (right click and "Reselect all SmartList Selections" daily)   Diet/type: NPO DVT prophylaxis: DOAC GI prophylaxis: PPI Lines: N/A Foley:  N/A Code Status:  full code Last date of multidisciplinary goals of care discussion [N/A]   Updated pt's son Derek Shelton via telephone 09/24/20.  All questions answered.  Labs   CBC: Recent Labs  Lab 09/18/2020 2128 09/21/20 0344 09/22/20 0500 09/22/20 1944 09/23/20 0448 09/24/20 0431  WBC 3.5* 3.4* 2.8*  --  5.4 6.8  NEUTROABS 2.0  --  2.4  --  4.8 6.2  HGB 7.0* 7.0* 6.6* 7.9* 8.1* 7.8*  HCT 22.9* 23.0* 21.9* 24.6* 24.8* 24.3*  MCV 92.0 92.4 90.9  --  87.9 88.4  PLT 131* 128* 131*  --  151 0000000    Basic Metabolic Panel: Recent Labs  Lab 09/11/2020 2128 09/21/20 0344 09/21/20 1305 09/22/20 0500 09/23/20 0448 09/24/20 0431  NA 135 133*  --  133* 133* 130*  K 3.2* 3.0*  --  4.0 4.9 5.4*  CL  94* 92*  --  94* 93* 90*  CO2 32 30  --  '29 29 27  '$ GLUCOSE 148* 249*  --  230* 161*  124*  BUN 69* 69*  --  77* 91* 113*  CREATININE 1.75* 1.69*  --  2.11* 2.66* 2.86*  CALCIUM 8.4* 8.3*  --  8.3* 8.4* 8.4*  MG  --   --  2.3 2.3 2.5* 2.5*   GFR: Estimated Creatinine Clearance: 22.1 mL/min (A) (by C-G formula based on SCr of 2.86 mg/dL (H)). Recent Labs  Lab 09/03/2020 2128 09/21/20 0344 09/22/20 0500 09/23/20 0448 09/24/20 0431  PROCALCITON  --  <0.10  --   --   --   WBC 3.5* 3.4* 2.8* 5.4 6.8  LATICACIDVEN 0.9 1.2  --   --   --     Liver Function Tests: Recent Labs  Lab 09/06/2020 2128 09/22/20 0500 09/23/20 0448 09/24/20 0431  AST 35 33 30 30  ALT '20 18 19 21  '$ ALKPHOS 73 66 62 56  BILITOT 0.8 0.8 1.5* 1.0  PROT 6.9 6.6 6.9 6.6  ALBUMIN 3.2* 3.1* 3.3* 3.2*   No results for input(s): LIPASE, AMYLASE in the last 168 hours. No results for input(s): AMMONIA in the last 168 hours.  ABG    Component Value Date/Time   PHART 7.42 09/24/2020 1559   PCO2ART 44 09/24/2020 1559   PO2ART 77 (L) 09/24/2020 1559   HCO3 28.5 (H) 09/24/2020 1559   O2SAT 95.5 09/24/2020 1559     Coagulation Profile: Recent Labs  Lab 08/31/2020 2128  INR 1.7*    Cardiac Enzymes: No results for input(s): CKTOTAL, CKMB, CKMBINDEX, TROPONINI in the last 168 hours.  HbA1C: Hgb A1c MFr Bld  Date/Time Value Ref Range Status  09/22/2020 05:00 AM 6.6 (H) 4.8 - 5.6 % Final    Comment:    (NOTE)         Prediabetes: 5.7 - 6.4         Diabetes: >6.4         Glycemic control for adults with diabetes: <7.0   12/02/2017 12:14 PM 8.5 (H) 4.8 - 5.6 % Final    Comment:    (NOTE) Pre diabetes:          5.7%-6.4% Diabetes:              >6.4% Glycemic control for   <7.0% adults with diabetes     CBG: Recent Labs  Lab 09/22/20 2039 09/23/20 2148 09/24/20 0818 09/24/20 1205 09/24/20 1547  GLUCAP 202* 199* 82 137* 125*    Review of Systems:   Unable to assess due to AMS.   Past Medical History:  He,  has a past medical history of Anemia, Asthma, CHF (congestive heart  failure) ([place]), COPD (chronic obstructive pulmonary disease) ([place]), Coronary artery disease, Dementia ([place]), Diabetes mellitus without complication ([place]), Dysrhythmia, GERD (gastroesophageal reflux disease), GI bleed, Hyperlipidemia, Hypertension, Hypothyroidism, Shortness of breath dyspnea, Sleep apnea, Stroke ([place]), and Thyroid disease.   Surgical History:   Past Surgical History:  Procedure Laterality Date   CARDIAC CATHETERIZATION     COLONOSCOPY N/A 08/10/2014   Procedure: COLONOSCOPY;  Surgeon: Manya Silvas, MD;  Location: [facility] ENDOSCOPY;  Service: Endoscopy;  Laterality: N/A;   COLONOSCOPY WITH PROPOFOL N/A 04/05/2016   Procedure: COLONOSCOPY WITH PROPOFOL;  Surgeon: Lollie Sails, MD;  Location[facility] ENDOSCOPY;  Service: Endoscopy;  Laterality: N/A;   COLONOSCOPY WITH PROPOFOL N/A 07/10/2019   Procedure: COLONOSCOPY WITH PROPOFOL;  Surgeon:  Lucilla Lame, MD;  Location: [facility] ENDOSCOPY;  Service: Endoscopy;  Laterality: N/A;   ESOPHAGOGASTRODUODENOSCOPY N/A 08/08/2014   Procedure: ESOPHAGOGASTRODUODENOSCOPY (EGD);  Surgeon: Manya Silvas, MD;  Location: [facility] ENDOSCOPY;  Service: Endoscopy;  Laterality: N/A;  plan for early afternoon   ESOPHAGOGASTRODUODENOSCOPY (EGD) WITH PROPOFOL N/A 07/10/2019   Procedure: ESOPHAGOGASTRODUODENOSCOPY (EGD) WITH PROPOFOL;  Surgeon: Lucilla Lame, MD;  Location: [facility] ENDOSCOPY;  Service: Endoscopy;  Laterality: N/A;   EYE SURGERY     GIVENS CAPSULE STUDY N/A 08/12/2014   Procedure: GIVENS CAPSULE STUDY;  Surgeon: Manya Silvas, MD;  Location: [facility] ENDOSCOPY;  Service: Endoscopy;  Laterality: N/A;   rectal fistula N/A      Social History:   reports that he has quit smoking. He has quit using smokeless tobacco.  His smokeless tobacco use included chew. He reports that he does not drink alcohol and does not use drugs.   Family History:  His family history includes Diabetes in his mother.   Allergies Allergies  Allergen Reactions   Carvedilol  Shortness Of Breath    Other reaction(s): Asthma, Shortness of Breath   Penicillins Shortness Of Breath    Other reaction(s): Other (See Comments) Other reaction(s): RASH Other reaction(s): RASH Other reaction(s): RASH    Aspirin     Other reaction(s): Other (See Comments) On eliquis already- high risk of bleeding   Aricept [Donepezil Hcl] Other (See Comments)    Medication cause significant bradycardia   Catapres [Clonidine Hcl]      Home Medications  Prior to Admission medications   Medication Sig Start Date End Date Taking? Authorizing Provider  ACCU-CHEK GUIDE test strip E11.9   TO CHECK BLOOD GLUCOSE TWICE A DAY 08/06/19  Yes [provider]  apixaban (ELIQUIS) 2.5 MG TABS tablet Take 2.5 mg by mouth 2 (two) times daily.   Yes [provider]  atorvastatin (LIPITOR) 40 MG tablet Take 1 tablet (40 mg total) by mouth daily at 6 PM. 12/03/17  Yes Demetrios Loll, MD  BD PEN NEEDLE NANO 2ND GEN 32G X 4 MM MISC USE NIGHTLY WITH TRESIBA 08/28/19  Yes [provider]  budesonide-formoterol (SYMBICORT) 160-4.5 MCG/ACT inhaler Inhale 2 puffs into the lungs 2 (two) times daily.   Yes [provider]  diltiazem (CARDIZEM CD) 180 MG 24 hr capsule Take 1 capsule (180 mg total) by mouth daily. 01/21/20 01/20/21 Yes Jennye Boroughs, MD  donepezil (ARICEPT) 5 MG tablet Take 5 mg by mouth at bedtime.   Yes [provider]  DULoxetine (CYMBALTA) 60 MG capsule Take 60 mg by mouth daily.   Yes [provider]  ergocalciferol (VITAMIN D2) 1.25 MG (50000 UT) capsule Take 50,000 Units by mouth once a week.   Yes [provider]  esomeprazole (NEXIUM) 40 MG capsule Take 40 mg by mouth daily at 12 noon.   Yes [provider]  ferrous sulfate 325 (65 FE) MG tablet Take 325 mg by mouth daily. 12/07/19  Yes [provider]  finasteride (PROSCAR) 5 MG tablet Take 5 mg by mouth daily.   Yes [provider]  formoterol  (PERFOROMIST) 20 MCG/2ML nebulizer solution Inhale 2 mLs into the lungs 2 (two) times daily. 12/16/19 12/15/20 Yes [provider]  insulin degludec (TRESIBA) 100 UNIT/ML FlexTouch Pen Inject 30 Units into the skin at bedtime.   Yes [provider]  ipratropium-albuterol (DUONEB) 0.5-2.5 (3) MG/3ML SOLN Inhale 3 mLs into the lungs every 6 (six) hours as needed for shortness of breath.  01/22/17  Yes [provider]  latanoprost (XALATAN) 0.005 % ophthalmic solution latanoprost 0.005 % eye drops  INSTILL ONE DROP TO BOTH EYES AT BEDTIME.   Yes [provider]  levothyroxine (SYNTHROID) 88 MCG tablet Take 88 mcg by mouth every morning. 07/08/20  Yes [provider]  loratadine (CLARITIN) 10 MG tablet Take 10 mg by mouth daily. 12/17/19  Yes [provider]  Melatonin 3 MG TABS Take 3 mg by mouth at bedtime as needed (sleep).    Yes [provider]  metolazone (ZAROXOLYN) 2.5 MG tablet Take 1 tablet (2.5 mg total) by mouth daily for 5 days. Patient taking differently: Take 2.5 mg by mouth as directed. Take on weds and Sundays 02/07/20 09/23/2020 Yes Bradler, Vista Lawman, MD  metoprolol tartrate (LOPRESSOR) 50 MG tablet Take 1 tablet (50 mg total) by mouth 2 (two) times daily. Patient taking differently: Take 25 mg by mouth 2 (two) times daily. 01/21/20  Yes Jennye Boroughs, MD  NEURONTIN 100 MG capsule Take 100 mg by mouth 2 (two) times daily. 07/09/20  Yes [provider]  omega-3 acid ethyl esters (LOVAZA) 1 G capsule Take 1 g by mouth daily.    Yes [provider]  pantoprazole (PROTONIX) 40 MG tablet Take 40 mg by mouth daily. 06/04/19  Yes [provider]  Potassium Chloride ER 20 MEQ TBCR Take 1 tablet by mouth daily. 06/11/20  Yes [provider]  sildenafil (REVATIO) 20 MG tablet Take 20 mg by mouth 3 (three) times daily. 05/09/19  Yes [provider]  tamsulosin (FLOMAX) 0.4 MG CAPS capsule Take 0.4 mg  by mouth daily. 05/07/19  Yes [provider]  tiotropium (SPIRIVA) 18 MCG inhalation capsule Place 18 mcg into inhaler and inhale daily.   Yes [provider]  torsemide (DEMADEX) 20 MG tablet Take 1 tablet (20 mg total) by mouth daily as needed (for swelling). 01/21/20  Yes Jennye Boroughs, MD  budesonide (PULMICORT) 0.5 MG/2ML nebulizer solution Inhale 0.5 mg into the lungs 2 (two) times daily. For 10 days Patient not taking: Reported on 09/14/2020 12/11/19   [provider]  predniSONE (DELTASONE) 20 MG tablet Take 2 tablets (40 mg total) by mouth daily with breakfast. Patient not taking: No sig reported 07/18/20   Wouk, Ailene Rud, MD     Critical care time: 60 minutes     Darel Hong, AGACNP-BC [facility] [place] epic messenger for cross cover needs If after hours, please call E-link

## 2020-09-24 NOTE — Progress Notes (Signed)
On full assessment, left pupil pinpoint and right slightly larger and reactive. Unable to complete full neuro assessment due to AMS. Patient moves all extremities. Patient has wheezing and scattered rhonchi in all lung fields. Unable to administer RT therapies due to pt inability to follow simple commands. Nebs requested. Axillary temp 94.3, rectal probe inserted reading similar. Bair hugger applied. Smear of stool black and tarry in appearance. Stat labs drawn. Hgb 6.9. E. Ouma, NP made aware of assessment findings. Awaiting further orders.

## 2020-09-24 NOTE — Progress Notes (Signed)
[facility] PROGRESS NOTE    Derek Shelton  [id number] DOB: 03/11/1940 DOA: 09/23/2020 PCP: Gladstone Lighter, MD      Brief Narrative:  Derek Shelton is a 80 y.o. M with obesity, COPD FEV1 54% on 2L home O2, dCHF, DM, CAD, HTN, and hypothyroidism who presented with positive home COVID test, tachycardia, cough, and wheezing.  In the ER, found to have tachycardia, A. fib with RVR, chest x-ray with congestion, COVID-positive.         Assessment & Plan:  Acute on chronic hypoxic respiratory failure This is multifactorial from COPD, maybe CHF, possibly COVID.  On 2L home O2     COPD with exacerbation Clinically, this appears to be the main driver, has been very wheezy.  Today, more sleepy. ABG shows pH normal pCO 53  - Continue steroids - Continue scheduled and PRN bronchodilators - Continue azithromycin - Continue Spiriva   COVID-19 Inflammatory markers low, some infiltrates on CXR, hard to tell if these were COVID related.  Vaccinated.  - Continue remdesivir - Continue steroids  Acute metabolic encephalopathy At baseline the patient is interactive and appropriate with only mild memory impairment.  At present he has somnolent, poorly responsive.  Clinically this appears likely uremic, less likely hypercarbic.  No focal deficits that I see.   Acute on chronic diastolic CHF BNP elevated, seemed to have bilateral infiltrates on CXR on admission.  Cardiology were consulted, recommended diuresis.  Cr worsening, diuretics now held - Hold diuretics   AKI on CKD IIIa Cr 1.5 at baseline.  Cr up to 2.6 on 8/31 with diuresis.  Lasix stopped and today still up to 2.8.  BUN elevated - Start fluids   Paroxysmal atrial fibrillation A. fib with RVR on admission Rates low 90s overnight, 200s this morning. - Continue apixaban, diltiazem, metoprolol - Consult cardiology, appreciate cares - Keep mag greater than 2, potassium greater than  4  Diabetes Glucose somewhat low - Continue glargine, reduced dose - Beside scale corrections - Hold mealtime insulin  Other medications - Continue finasteride, PPI, duloxetine, gabapentin, loratadine,  Dementia - Continue donepezil  Hypothyroidism - Continue levothyroxine  Chronic anemia Hgb transfused 1 unit 8/30, hemoglobin stable posttransfusion without signs of clinical bleeding.           Disposition: Status is: Inpatient  Remains inpatient appropriate because:IV treatments appropriate due to intensity of illness or inability to take PO  Dispo: The patient is from: Home              Anticipated d/c is to: SNF              Patient currently is not medically stable to d/c.   Difficult to place patient No       Level of care: Progressive Cardiac       MDM: The below labs and imaging reports were reviewed and summarized above.  Medication management as above.    DVT prophylaxis: apixaban (ELIQUIS) tablet 2.5 mg Start: 09/21/20 0800 apixaban (ELIQUIS) tablet 2.5 mg  Code Status: FULL Family Communication: Son by phone        Subjective: No fever overnight.  Patient is more sleepy and somnolent today.  Still breathing fast, still wheezing, has no headache, chest pain, abdominal pain, no pain complaints.  No bleeding.  Objective: Vitals:   09/24/20 0434 09/24/20 0447 09/24/20 0816 09/24/20 1151  BP: 110/82  (!) 104/94 118/63  Pulse: 79  92 (!) 109  Resp: 16  16  Temp: 98.1 F (36.7 C)  99.1 F (37.3 C) 98.3 F (36.8 C)  TempSrc:   Oral Oral  SpO2: 97%  97% 97%  Weight:  94.1 kg      Intake/Output Summary (Last 24 hours) at 09/24/2020 1559 Last data filed at 09/24/2020 0447 Gross per 24 hour  Intake 340 ml  Output 600 ml  Net -260 ml   Filed Weights   09/22/20 0103 09/23/20 0500 09/24/20 0447  Weight: 90 kg 91 kg 94.1 kg    Examination: General appearance: Adult male, lying in bed, appears sleepy     HEENT: Anicteric,  conjunctive pink, lids and lashes normal.  No nasal deformity, discharge, or epistaxis.  Oropharynx tacky dry. Skin:  Cardiac: Tachycardic, regular, do not appreciate murmurs, no pitting lower extremity edema. Respiratory: Respiratory effort shallow, wheezing bilaterally, respiratory rate seems somewhat increased. Abdomen: Abdomen soft no tenderness palpation or guarding, no ascites or distention MSK:  Neuro: Awake but sluggish and sleepy, arouses only to touch.  Moves upper extremities against manipulation with good tone.  She appears generally weak. Psych: Sleepy, attention diminished.     Data Reviewed: I have personally reviewed following labs and imaging studies:  CBC: Recent Labs  Lab 09/08/2020 2128 09/21/20 0344 09/22/20 0500 09/22/20 1944 09/23/20 0448 09/24/20 0431  WBC 3.5* 3.4* 2.8*  --  5.4 6.8  NEUTROABS 2.0  --  2.4  --  4.8 6.2  HGB 7.0* 7.0* 6.6* 7.9* 8.1* 7.8*  HCT 22.9* 23.0* 21.9* 24.6* 24.8* 24.3*  MCV 92.0 92.4 90.9  --  87.9 88.4  PLT 131* 128* 131*  --  151 0000000   Basic Metabolic Panel: Recent Labs  Lab 09/11/2020 2128 09/21/20 0344 09/21/20 1305 09/22/20 0500 09/23/20 0448 09/24/20 0431  NA 135 133*  --  133* 133* 130*  K 3.2* 3.0*  --  4.0 4.9 5.4*  CL 94* 92*  --  94* 93* 90*  CO2 32 30  --  '29 29 27  '$ GLUCOSE 148* 249*  --  230* 161* 124*  BUN 69* 69*  --  77* 91* 113*  CREATININE 1.75* 1.69*  --  2.11* 2.66* 2.86*  CALCIUM 8.4* 8.3*  --  8.3* 8.4* 8.4*  MG  --   --  2.3 2.3 2.5* 2.5*   GFR: Estimated Creatinine Clearance: 22.1 mL/min (A) (by C-G formula based on SCr of 2.86 mg/dL (H)). Liver Function Tests: Recent Labs  Lab 09/09/2020 2128 09/22/20 0500 09/23/20 0448 09/24/20 0431  AST 35 33 30 30  ALT '20 18 19 21  '$ ALKPHOS 73 66 62 56  BILITOT 0.8 0.8 1.5* 1.0  PROT 6.9 6.6 6.9 6.6  ALBUMIN 3.2* 3.1* 3.3* 3.2*   No results for input(s): LIPASE, AMYLASE in the last 168 hours. No results for input(s): AMMONIA in the last 168  hours. Coagulation Profile: Recent Labs  Lab 08/24/2020 2128  INR 1.7*   Cardiac Enzymes: No results for input(s): CKTOTAL, CKMB, CKMBINDEX, TROPONINI in the last 168 hours. BNP (last 3 results) No results for input(s): PROBNP in the last 8760 hours. HbA1C: Recent Labs    09/22/20 0500  HGBA1C 6.6*   CBG: Recent Labs  Lab 09/22/20 2039 09/23/20 2148 09/24/20 0818 09/24/20 1205 09/24/20 1547  GLUCAP 202* 199* 82 137* 125*   Lipid Profile: No results for input(s): CHOL, HDL, LDLCALC, TRIG, CHOLHDL, LDLDIRECT in the last 72 hours. Thyroid Function Tests: Recent Labs    09/22/20 0500  TSH 0.710  Anemia Panel: Recent Labs    09/23/20 0448 09/24/20 0431  FERRITIN 21* 24   Urine analysis:    Component Value Date/Time   COLORURINE YELLOW (A) 09/21/2020 2128   APPEARANCEUR CLEAR (A) 09/01/2020 2128   APPEARANCEUR Clear 07/17/2018 1601   LABSPEC 1.015 09/06/2020 2128   LABSPEC 1.004 05/06/2014 2100   PHURINE 5.0 09/08/2020 2128   GLUCOSEU NEGATIVE 09/09/2020 2128   GLUCOSEU Negative 05/06/2014 2100   HGBUR NEGATIVE 09/01/2020 2128   BILIRUBINUR NEGATIVE 09/12/2020 2128   BILIRUBINUR Negative 07/17/2018 1601   BILIRUBINUR Negative 05/06/2014 2100   KETONESUR NEGATIVE 09/04/2020 2128   PROTEINUR NEGATIVE 09/03/2020 2128   NITRITE NEGATIVE 09/15/2020 2128   LEUKOCYTESUR NEGATIVE 09/16/2020 2128   LEUKOCYTESUR Negative 05/06/2014 2100   Sepsis Labs: '@LABRCNTIP'$ (procalcitonin:4,lacticacidven:4)  ) Recent Results (from the past 240 hour(s))  Urine Culture     Status: Abnormal   Collection Time: 09/16/2020  9:28 PM   Specimen: Urine, Random  Result Value Ref Range Status   Specimen Description   Final    URINE, RANDOM Performed at [facility], [address] [place], [place] [address]    Special Requests   Final    NONE Performed at [facility], [address], [place], [place] [address]    Culture (A)  Final    <10,000 COLONIES/mL  INSIGNIFICANT GROWTH Performed at [facility], [place] [address] [place], [place] [address]    Report Status 09/23/2020 FINAL  Final  Blood Culture (routine x 2)     Status: None (Preliminary result)   Collection Time: 08/25/2020  9:28 PM   Specimen: BLOOD  Result Value Ref Range Status   Specimen Description BLOOD BLOOD RIGHT FOREARM  Final   Special Requests   Final    BOTTLES DRAWN AEROBIC AND ANAEROBIC Blood Culture results may not be optimal due to an inadequate volume of blood received in culture bottles   Culture   Final    NO GROWTH 4 DAYS Performed at [facility], [address] [place], [place] [address]    Report Status PENDING  Incomplete  Blood Culture (routine x 2)     Status: None (Preliminary result)   Collection Time: 08/29/2020  9:28 PM   Specimen: BLOOD  Result Value Ref Range Status   Specimen Description BLOOD RIGHT ANTECUBITAL  Final   Special Requests   Final    BOTTLES DRAWN AEROBIC AND ANAEROBIC Blood Culture adequate volume   Culture   Final    NO GROWTH 4 DAYS Performed at [facility], [address], [place], [place] [address]    Report Status PENDING  Incomplete  Resp Panel by RT-PCR (Flu A&B, Covid) Nasopharyngeal Swab     Status: Abnormal   Collection Time: 09/19/2020  9:28 PM   Specimen: Nasopharyngeal Swab; Nasopharyngeal(NP) swabs in vial transport medium  Result Value Ref Range Status   SARS Coronavirus 2 by RT PCR POSITIVE (A) NEGATIVE Final    Comment: RESULT CALLED TO, READ BACK BY AND VERIFIED WITH: MELISSA HOUP'@2253'$  09/07/2020 RH (NOTE) SARS-CoV-2 target nucleic acids are DETECTED.  The SARS-CoV-2 RNA is generally detectable in upper respiratory specimens during the acute phase of infection. Positive results are indicative of the presence of the identified virus, but do not rule out bacterial infection or co-infection with other pathogens not detected by the test. Clinical correlation with patient history and other  diagnostic information is necessary to determine patient infection status. The expected result is  Negative.  Fact Sheet for Patients: EntrepreneurPulse.com.au  Fact Sheet for Healthcare Providers: IncredibleEmployment.be  This test is not yet approved or cleared by the [place] FDA and  has been authorized for detection and/or diagnosis of SARS-CoV-2 by FDA under an Emergency Use Authorization (EUA).  This EUA will remain in effect (meaning this test can be used ) for the duration of  the COVID-19 declaration under Section 564(b)(1) of the Act, 21 U.S.C. section 360bbb-3(b)(1), unless the authorization is terminated or revoked sooner.     Influenza A by PCR NEGATIVE NEGATIVE Final   Influenza B by PCR NEGATIVE NEGATIVE Final    Comment: (NOTE) The Xpert Xpress SARS-CoV-2/FLU/RSV plus assay is intended as an aid in the diagnosis of influenza from Nasopharyngeal swab specimens and should not be used as a sole basis for treatment. Nasal washings and aspirates are unacceptable for Xpert Xpress SARS-CoV-2/FLU/RSV testing.  Fact Sheet for Patients: EntrepreneurPulse.com.au  Fact Sheet for Healthcare Providers: IncredibleEmployment.be  This test is not yet approved or cleared by the [place] FDA and has been authorized for detection and/or diagnosis of SARS-CoV-2 by FDA under an Emergency Use Authorization (EUA). This EUA will remain in effect (meaning this test can be used) for the duration of the COVID-19 declaration under Section 564(b)(1) of the Act, 21 U.S.C. section 360bbb-3(b)(1), unless the authorization is terminated or revoked.  Performed at [facility], [address], [place], [place] [address]          Radiology Studies: No results found.      Scheduled Meds:  sodium chloride   Intravenous Once   apixaban  2.5 mg Oral BID   vitamin C  500 mg Oral Daily    atorvastatin  40 mg Oral q1800   azithromycin  250 mg Oral Daily   cholecalciferol  1,000 Units Oral Daily   diltiazem  60 mg Oral Q8H   donepezil  5 mg Oral QHS   DULoxetine  60 mg Oral Daily   finasteride  5 mg Oral Daily   gabapentin  100 mg Oral BID   guaiFENesin  600 mg Oral BID   insulin aspart  0-9 Units Subcutaneous TID WC   insulin glargine-yfgn  24 Units Subcutaneous QHS   Ipratropium-Albuterol  2 puff Inhalation QID   latanoprost  1 drop Both Eyes QHS   levothyroxine  88 mcg Oral Q0600   loratadine  10 mg Oral Daily   methylPREDNISolone (SOLU-MEDROL) injection  40 mg Intravenous Q12H   metoprolol tartrate  25 mg Oral BID   mometasone-formoterol  2 puff Inhalation BID   omega-3 acid ethyl esters  1 g Oral Daily   pantoprazole  40 mg Oral Daily   sildenafil  20 mg Oral TID   tamsulosin  0.4 mg Oral Daily   tiotropium  18 mcg Inhalation Daily   Vitamin D (Ergocalciferol)  50,000 Units Oral Weekly   zinc sulfate  220 mg Oral Daily   Continuous Infusions:  sodium chloride 75 mL/hr at 09/24/20 0932     LOS: 4 days    Time spent: 35 minutes    Edwin Dada, MD [facility] 09/24/2020, 3:59 PM     Please page though [place] or Epic secure chat:  For [employer], Adult nurse

## 2020-09-24 DEATH — deceased

## 2020-09-25 DIAGNOSIS — U071 COVID-19: Secondary | ICD-10-CM | POA: Diagnosis not present

## 2020-09-25 LAB — CBC WITH DIFFERENTIAL/PLATELET
Abs Immature Granulocytes: 0.04 10*3/uL (ref 0.00–0.07)
Basophils Absolute: 0 10*3/uL (ref 0.0–0.1)
Basophils Relative: 0 %
Eosinophils Absolute: 0 10*3/uL (ref 0.0–0.5)
Eosinophils Relative: 0 %
HCT: 30.6 % — ABNORMAL LOW (ref 39.0–52.0)
Hemoglobin: 9.5 g/dL — ABNORMAL LOW (ref 13.0–17.0)
Immature Granulocytes: 0 %
Lymphocytes Relative: 3 %
Lymphs Abs: 0.3 10*3/uL — ABNORMAL LOW (ref 0.7–4.0)
MCH: 27.5 pg (ref 26.0–34.0)
MCHC: 31 g/dL (ref 30.0–36.0)
MCV: 88.7 fL (ref 80.0–100.0)
Monocytes Absolute: 0.4 10*3/uL (ref 0.1–1.0)
Monocytes Relative: 4 %
Neutro Abs: 8.9 10*3/uL — ABNORMAL HIGH (ref 1.7–7.7)
Neutrophils Relative %: 93 %
Platelets: 168 10*3/uL (ref 150–400)
RBC: 3.45 MIL/uL — ABNORMAL LOW (ref 4.22–5.81)
RDW: 16.9 % — ABNORMAL HIGH (ref 11.5–15.5)
WBC: 9.6 10*3/uL (ref 4.0–10.5)
nRBC: 0.2 % (ref 0.0–0.2)

## 2020-09-25 LAB — GLUCOSE, CAPILLARY
Glucose-Capillary: 133 mg/dL — ABNORMAL HIGH (ref 70–99)
Glucose-Capillary: 145 mg/dL — ABNORMAL HIGH (ref 70–99)
Glucose-Capillary: 145 mg/dL — ABNORMAL HIGH (ref 70–99)
Glucose-Capillary: 150 mg/dL — ABNORMAL HIGH (ref 70–99)
Glucose-Capillary: 136 mg/dL — ABNORMAL HIGH (ref 70–99)

## 2020-09-25 LAB — RENAL FUNCTION PANEL
Albumin: 3 g/dL — ABNORMAL LOW (ref 3.5–5.0)
Anion gap: 7 (ref 5–15)
BUN: 80 mg/dL — ABNORMAL HIGH (ref 8–23)
CO2: 29 mmol/L (ref 22–32)
Calcium: 8.8 mg/dL — ABNORMAL LOW (ref 8.9–10.3)
Chloride: 99 mmol/L (ref 98–111)
Creatinine, Ser: 1.68 mg/dL — ABNORMAL HIGH (ref 0.61–1.24)
GFR, Estimated: 41 mL/min — ABNORMAL LOW (ref >60)
Glucose, Bld: 135 mg/dL — ABNORMAL HIGH (ref 70–99)
Phosphorus: 4.1 mg/dL (ref 2.5–4.6)
Potassium: 4.1 mmol/L (ref 3.5–5.1)
Sodium: 135 mmol/L (ref 135–145)

## 2020-09-25 LAB — COMPREHENSIVE METABOLIC PANEL
ALT: 22 U/L (ref 0–44)
AST: 27 U/L (ref 15–41)
Albumin: 3.3 g/dL — ABNORMAL LOW (ref 3.5–5.0)
Alkaline Phosphatase: 58 U/L (ref 38–126)
Anion gap: 9 (ref 5–15)
BUN: 106 mg/dL — ABNORMAL HIGH (ref 8–23)
CO2: 28 mmol/L (ref 22–32)
Calcium: 8.4 mg/dL — ABNORMAL LOW (ref 8.9–10.3)
Chloride: 94 mmol/L — ABNORMAL LOW (ref 98–111)
Creatinine, Ser: 2.37 mg/dL — ABNORMAL HIGH (ref 0.61–1.24)
GFR, Estimated: 27 mL/min — ABNORMAL LOW (ref >60)
Glucose, Bld: 132 mg/dL — ABNORMAL HIGH (ref 70–99)
Potassium: 4.4 mmol/L (ref 3.5–5.1)
Sodium: 131 mmol/L — ABNORMAL LOW (ref 135–145)
Total Bilirubin: 1.3 mg/dL — ABNORMAL HIGH (ref 0.3–1.2)
Total Protein: 6.7 g/dL (ref 6.5–8.1)

## 2020-09-25 LAB — CULTURE, BLOOD (ROUTINE X 2)
Culture: NO GROWTH
Culture: NO GROWTH
Special Requests: ADEQUATE

## 2020-09-25 LAB — FERRITIN: Ferritin: 23 ng/mL — ABNORMAL LOW (ref 24–336)

## 2020-09-25 LAB — PREPARE RBC (CROSSMATCH)

## 2020-09-25 LAB — D-DIMER, QUANTITATIVE: D-Dimer, Quant: 0.45 ug/mL-FEU (ref 0.00–0.50)

## 2020-09-25 LAB — MAGNESIUM: Magnesium: 2.6 mg/dL — ABNORMAL HIGH (ref 1.7–2.4)

## 2020-09-25 LAB — C-REACTIVE PROTEIN: CRP: <0.5 mg/dL (ref <1.0)

## 2020-09-25 MED ORDER — PRISMASOL BGK 0/2.5 32-2.5 MEQ/L EC SOLN
Status: DC
  Filled 2020-09-25: qty 5000

## 2020-09-25 MED ORDER — HEPARIN SODIUM (PORCINE) 1000 UNIT/ML DIALYSIS
1000.0000 [IU] | INTRAMUSCULAR | Status: DC | PRN
  Filled 2020-09-25: qty 6

## 2020-09-25 MED ORDER — METOPROLOL TARTRATE 5 MG/5ML IV SOLN
2.5000 mg | Freq: Four times a day (QID) | INTRAVENOUS | Status: DC | PRN
  Administered 2020-09-25 – 2020-09-26 (×3): 5 mg via INTRAVENOUS
  Administered 2020-09-27 (×2): 2.5 mg via INTRAVENOUS
  Administered 2020-09-28 (×2): 5 mg via INTRAVENOUS
  Administered 2020-09-28: 2.5 mg via INTRAVENOUS
  Administered 2020-09-28 – 2020-09-29 (×2): 5 mg via INTRAVENOUS
  Filled 2020-09-25 (×10): qty 5

## 2020-09-25 MED ORDER — DEXTROSE 5 % IV SOLN
250.0000 mg | INTRAVENOUS | Status: AC
  Administered 2020-09-25 – 2020-09-28 (×4): 250 mg via INTRAVENOUS
  Filled 2020-09-25 (×5): qty 250

## 2020-09-25 MED ORDER — NEPRO/CARBSTEADY PO LIQD
237.0000 mL | Freq: Three times a day (TID) | ORAL | Status: DC
  Administered 2020-09-25 – 2020-09-28 (×7): 237 mL via ORAL

## 2020-09-25 MED ORDER — RENA-VITE PO TABS
1.0000 | ORAL_TABLET | Freq: Every day | ORAL | Status: DC
  Administered 2020-09-25 – 2020-09-28 (×4): 1 via ORAL
  Filled 2020-09-25 (×4): qty 1

## 2020-09-25 MED ORDER — METOPROLOL TARTRATE 5 MG/5ML IV SOLN: 2.5000 mg | Freq: Four times a day (QID) | INTRAVENOUS | Status: DC | PRN

## 2020-09-25 MED ORDER — METOPROLOL TARTRATE 25 MG PO TABS
12.5000 mg | ORAL_TABLET | Freq: Four times a day (QID) | ORAL | Status: DC
  Administered 2020-09-25: 12.5 mg via ORAL
  Filled 2020-09-25: qty 1

## 2020-09-25 MED ORDER — NOREPINEPHRINE 4 MG/250ML-% IV SOLN
0.0000 ug/min | INTRAVENOUS | Status: DC
  Administered 2020-09-25: 4 ug/min via INTRAVENOUS
  Administered 2020-09-26: 3 ug/min via INTRAVENOUS
  Administered 2020-09-26: 2 ug/min via INTRAVENOUS
  Filled 2020-09-25: qty 250

## 2020-09-25 MED ORDER — HEPARIN (PORCINE) IN NACL 2-0.9 UNITS/ML
INTRAMUSCULAR | Status: AC
  Filled 2020-09-25 (×4): qty 1000

## 2020-09-25 MED ORDER — METOPROLOL TARTRATE 5 MG/5ML IV SOLN
2.5000 mg | INTRAVENOUS | Status: AC
  Administered 2020-09-25: 2.5 mg via INTRAVENOUS
  Filled 2020-09-25: qty 5

## 2020-09-25 MED ORDER — SODIUM CHLORIDE 0.9 % FOR CRRT
INTRAVENOUS_CENTRAL | Status: DC | PRN
  Filled 2020-09-25: qty 1000

## 2020-09-25 NOTE — Progress Notes (Signed)
Attempted to use language line to arouse patient, unsuccessful. Patient opened eyes to oral care but unable to illicit any other neuro responses. Will continue to monitor.

## 2020-09-25 NOTE — Progress Notes (Signed)
NAME:  Derek Shelton, MRN:  [id number], DOB:  Oct 25, 1940, LOS: 5 ADMISSION DATE:  09/17/2020, CONSULTATION DATE:  09/24/2020 REFERRING MD:  Dr. Loleta Books, CHIEF COMPLAINT:  Altered mental status, Acute on Chronic Hypoxic Respiratory Failure   Brief Pt Description / Synopsis:  80 year old male with a past medical history significant for COPD with FEV1 54% requiring 2 L home O2 at baseline, HFpEF, obesity, diabetes mellitus, coronary artery disease, hypertension, and hypothyroidism admitted with acute on chronic hypoxic respiratory failure in the setting of acute COPD exacerbation and COVID-19 infection.  Course complicated by Acute Metabolic Encephalopathy in setting of AKI and Uremia requiring Dialysis.  History of Present Illness:  Derek Shelton is a 80 year old male (Pakistani speaking only) with a past medical history significant for COPD with FEV1 54% requiring 2 L supplemental O2 at home baseline, HFpEF, coronary artery disease, diabetes mellitus type 2, hypertension, dyslipidemia, and hypothyroidism who presented to [facility] ED on 09/24/2020 shortness of breath, productive cough, and wheezing.  He also endorsed nausea and poor appetite.  He denied vomiting, diarrhea, chest pain, palpitations, loss of taste or smell, dysuria.  Of note he did test positive for COVID with a home antigen test.  ED course: Vital signs: Pulse 120, respiratory rate 28, SPO2 88% on room air Labs: BUN 69, creatinine 1.75, BNP 288.7 COVID-19 PCR positive EKG with atrial fib with RVR and right bundle branch block Chest x-ray with cardiomegaly and central vascular congestion  He was admitted by the hospitalist for further work-up and treatment of acute on chronic hypoxic respiratory failure in the setting of acute COPD repeat x-ray showed COVID-19 infection, and acute decompensated HFpEF.  Cardiology was consulted.  Hospital course: On 09/24/2020, creatinine worsened to which diuretics were held and was given IV fluids..  He  was also noted to be more somnolent/lethargic with soft blood pressures.  He is being transferred to the stepdown unit for further work-up and treatment.  PCCM is consulted for further assistance with management of Acute Metabolic Encephalopathy, suspect secondary to Uremia.  Discussed with Nephrology, will plan for placement of temporary dialysis catheter placement and Dialysis.  Pertinent  Medical History  COPD on 2 L supplemental oxygen at baseline HFpEF Coronary artery disease Hypertension Hyperlipidemia Obesity Sleep apnea Dementia GERD GI bleed Hypothyroidism  Micro Data:  08/28/2020: SARS-CoV-2 PCR>> positive 09/19/2020: Influenza PCR>>negative 08/29/2020: Blood culture x2>>negative  09/23/2020: Urine culture>><10,000 colonies /mL insignificant growth   Antimicrobials:  Ceftriaxone 8/29 x1 dose Azithromycin 8/31>> Remdesivir 8/29>>9/1  Significant Hospital Events: Including procedures, antibiotic start and stop dates in addition to other pertinent events   09/24/20: Creatinine worsening, diuretics held. Pt with lethargy, transfer to ICU.  High risk for intubation.  Nephrology consulted for severe uremia.  Plan to place temporary dialysis catheter in anticipation of need for dialysis. 09/25/20: CRRT continued   Interim History / Subjective:  Pt now able to follow commands and tolerate po diet; no longer requiring levophed gtt.  However, he continues to have rate control issues associated with chronic atrial fibrillation prn iv metoprolol ordered.    Objective   Blood pressure 103/81, pulse 64, temperature (!) 96.4 F (35.8 C), resp. rate 11, height '5\' 8"'$  (1.727 m), weight 81.8 kg, SpO2 98 %.        Intake/Output Summary (Last 24 hours) at 09/25/2020 1051 Last data filed at 09/25/2020 1000 Gross per 24 hour  Intake 1135.51 ml  Output 1524 ml  Net -388.49 ml   Filed Weights   09/24/20  [id number] 09/24/20 1730 09/25/20 0414  Weight: 94.1 kg 82.5 kg 81.8 kg    Examination:  Physical examination is limited due to need for PPE/CAPR General: Acute on chronically ill-appearing male, laying in bed, somnolent, in no acute distress.  On CRRT HENT: Atraumatic, normocephalic, neck supple Lungs: Diminished breath sounds bilaterally with coarse breath sounds in bilateral upper fields, even, nonlabored Cardiovascular: Tachycardia, irregular irregular rhythm (A. fib on telemetry), Abdomen: Obese, soft, nontender, nondistended, no guarding rebound tenderness, bowel sounds positive x4 Extremities: Normal bulk and tone, no deformities Neuro: Somnolent, does interact readily however when engaged GU: External catheter in place  Resolved Hospital Problem list     Assessment & Plan:   Acute on Chronic Hypoxic Respiratory Failure in the setting of AECOPD, COVID-19 Infection, and suspected Acute Decompensated HFpEF PMHx of COPD on 2L supplemental O2 at baseline -Supplemental O2 as needed to maintain O2 sats >88% -High risk for intubation (avoid BiPAP in setting of altered mental status) -Follow intermittent Chest X-ray and ABG as needed -Diuresis as blood pressure and renal function permits ~ Lasix currently on hold due to worsening Creatinine and soft blood pressure -Remdesivir, plan for 5 days -IV steroids discontinued~09/2  -Continue scheduled and PRN bronchodilators via MDI -Continue Azithromycin -Follow inflammatory markers: Ferritin, D-dimer, CRP -Assess for possible tocilizumab  -Vitamin C, zinc  Acute on Chronic HFpEF Paroxsymal Atrial fibrillation A. Fib w/ RVR on admission Echocardiogram from 07/17/20 with LVEF >55%, unable to evaluate diastolic parameters, increased pulmonary artery systolic pressure, and moderate to severe tricuspid valve regurgitation -Continuous cardiac monitoring -Maintain MAP >65 -Vasopressors as needed to maintain MAP goal -Cardiology following, appreciate input~-Prn iv metoprolol for hr >115  -Diuresis as BP and renal function permits ~  Lasix currently on hold due to worsening Creatinine -Hold outpatient apixaban, cardizem, and metoprolol  AKI on CKD Stage IIIa (baseline Cr. 1.5) Hyperkalemia-resolved  Hyponatremia -Monitor I&O's / urinary output -Follow BMP -Ensure adequate renal perfusion -Avoid nephrotoxic agents as able -Replace electrolytes as indicated -Nephrology consulted, appreciate input~CRRT initiated 0000000  Acute Metabolic Encephalopathy, suspect secondary to Uremia also complicated by dementia~improving  -Provide supportive care -Avoid sedating meds able -Frequent reorientation   Acute on chronic anemia~black tarry stools during hospitalization  -Monitor for S/Sx of bleeding -Trend CBC -Hold outpatient eliquis  -Transfuse for Hgb <7  Diabetes Mellitus -CBG's q4h; Target range of 140 to 180 -SSI and scheduled semglee  -Follow ICU Hypo/Hyperglycemia protocol  Best Practice (right click and "Reselect all SmartList Selections" daily)   Diet/type: Carb modified  DVT prophylaxis: SCD's  GI prophylaxis: PPI Lines: Right femoral trialysis catheter  Foley:  N/A Code Status:  full code Last date of multidisciplinary goals of care discussion [09/25/2020]  Labs   CBC: Recent Labs  Lab 09/19/2020 2128 09/21/20 0344 09/22/20 0500 09/22/20 1944 09/23/20 0448 09/24/20 0431 09/24/20 2252 09/25/20 0738  WBC 3.5*   < > 2.8*  --  5.4 6.8 5.5 9.6  NEUTROABS 2.0  --  2.4  --  4.8 6.2  --  8.9*  HGB 7.0*   < > 6.6* 7.9* 8.1* 7.8* 6.9* 9.5*  HCT 22.9*   < > 21.9* 24.6* 24.8* 24.3* 21.8* 30.6*  MCV 92.0   < > 90.9  --  87.9 88.4 92.0 88.7  PLT 131*   < > 131*  --  151 153 125* 168   < > = values in this interval not displayed.    Basic Metabolic Panel: Recent Labs  Lab 09/22/20 0500  09/23/20 0448 09/24/20 0431 09/24/20 2252 09/25/20 0738  NA 133* 133* 130* 131* 131*  K 4.0 4.9 5.4* 4.9 4.4  CL 94* 93* 90* 93* 94*  CO2 '29 29 27 29 28  '$ GLUCOSE 230* 161* 124* 114* 132*  BUN 77* 91* 113* 123*  106*  CREATININE 2.11* 2.66* 2.86* 2.94* 2.37*  CALCIUM 8.3* 8.4* 8.4* 8.1* 8.4*  MG 2.3 2.5* 2.5* 2.5* 2.6*   GFR: Estimated Creatinine Clearance: 23.6 mL/min (A) (by C-G formula based on SCr of 2.37 mg/dL (H)). Recent Labs  Lab 09/10/2020 2128 09/21/20 0344 09/22/20 0500 09/23/20 0448 09/24/20 0431 09/24/20 2252 09/25/20 0738  PROCALCITON  --  <0.10  --   --   --   --   --   WBC 3.5* 3.4*   < > 5.4 6.8 5.5 9.6  LATICACIDVEN 0.9 1.2  --   --   --   --   --    < > = values in this interval not displayed.    Liver Function Tests: Recent Labs  Lab 09/23/2020 2128 09/22/20 0500 09/23/20 0448 09/24/20 0431 09/25/20 0738  AST 35 33 '30 30 27  '$ ALT '20 18 19 21 22  '$ ALKPHOS 73 66 62 56 58  BILITOT 0.8 0.8 1.5* 1.0 1.3*  PROT 6.9 6.6 6.9 6.6 6.7  ALBUMIN 3.2* 3.1* 3.3* 3.2* 3.3*   No results for input(s): LIPASE, AMYLASE in the last 168 hours. No results for input(s): AMMONIA in the last 168 hours.  ABG    Component Value Date/Time   PHART 7.42 09/24/2020 1559   PCO2ART 44 09/24/2020 1559   PO2ART 77 (L) 09/24/2020 1559   HCO3 28.5 (H) 09/24/2020 1559   O2SAT 95.5 09/24/2020 1559     Coagulation Profile: Recent Labs  Lab 09/11/2020 2128  INR 1.7*    Cardiac Enzymes: No results for input(s): CKTOTAL, CKMB, CKMBINDEX, TROPONINI in the last 168 hours.  HbA1C: Hgb A1c MFr Bld  Date/Time Value Ref Range Status  09/22/2020 05:00 AM 6.6 (H) 4.8 - 5.6 % Final    Comment:    (NOTE)         Prediabetes: 5.7 - 6.4         Diabetes: >6.4         Glycemic control for adults with diabetes: <7.0   12/02/2017 12:14 PM 8.5 (H) 4.8 - 5.6 % Final    Comment:    (NOTE) Pre diabetes:          5.7%-6.4% Diabetes:              >6.4% Glycemic control for   <7.0% adults with diabetes     CBG: Recent Labs  Lab 09/24/20 1547 09/24/20 1732 09/24/20 2004 09/24/20 2229 09/25/20 0718  GLUCAP 125* 115* 104* 111* 133*    Review of Systems:   Unable to assess due to  AMS.   Past Medical History:  He,  has a past medical history of Anemia, Asthma, CHF (congestive heart failure) ([place]), COPD (chronic obstructive pulmonary disease) ([place]), Coronary artery disease, Dementia ([place]), Diabetes mellitus without complication ([place]), Dysrhythmia, GERD (gastroesophageal reflux disease), GI bleed, Hyperlipidemia, Hypertension, Hypothyroidism, Shortness of breath dyspnea, Sleep apnea, Stroke ([place]), and Thyroid disease.   Surgical History:   Past Surgical History:  Procedure Laterality Date   CARDIAC CATHETERIZATION     COLONOSCOPY N/A 08/10/2014   Procedure: COLONOSCOPY;  Surgeon: Manya Silvas, MD;  Location: [facility] ENDOSCOPY;  Service: Endoscopy;  Laterality: N/A;   COLONOSCOPY  WITH PROPOFOL N/A 04/05/2016   Procedure: COLONOSCOPY WITH PROPOFOL;  Surgeon: Lollie Sails, MD;  Location: [facility] ENDOSCOPY;  Service: Endoscopy;  Laterality: N/A;   COLONOSCOPY WITH PROPOFOL N/A 07/10/2019   Procedure: COLONOSCOPY WITH PROPOFOL;  Surgeon: Lucilla Lame, MD;  Location: [facility] ENDOSCOPY;  Service: Endoscopy;  Laterality: N/A;   ESOPHAGOGASTRODUODENOSCOPY N/A 08/08/2014   Procedure: ESOPHAGOGASTRODUODENOSCOPY (EGD);  Surgeon: Manya Silvas, MD;  Location: [facility] ENDOSCOPY;  Service: Endoscopy;  Laterality: N/A;  plan for early afternoon   ESOPHAGOGASTRODUODENOSCOPY (EGD) WITH PROPOFOL N/A 07/10/2019   Procedure: ESOPHAGOGASTRODUODENOSCOPY (EGD) WITH PROPOFOL;  Surgeon: Lucilla Lame, MD;  Location: [facility] ENDOSCOPY;  Service: Endoscopy;  Laterality: N/A;   EYE SURGERY     GIVENS CAPSULE STUDY N/A 08/12/2014   Procedure: GIVENS CAPSULE STUDY;  Surgeon: Manya Silvas, MD;  Location: [facility] ENDOSCOPY;  Service: Endoscopy;  Laterality: N/A;   rectal fistula N/A      Social History:   reports that he has quit smoking. He has quit using smokeless tobacco.  His smokeless tobacco use included chew. He reports that he does not drink alcohol and does not use drugs.   Family History:  His  family history includes Diabetes in his mother.   Allergies Allergies  Allergen Reactions   Carvedilol Shortness Of Breath    Other reaction(s): Asthma, Shortness of Breath   Penicillins Shortness Of Breath    Other reaction(s): Other (See Comments) Other reaction(s): RASH Other reaction(s): RASH Other reaction(s): RASH    Aspirin     Other reaction(s): Other (See Comments) On eliquis already- high risk of bleeding   Aricept [Donepezil Hcl] Other (See Comments)    Medication cause significant bradycardia   Catapres [Clonidine Hcl]      Home Medications  Prior to Admission medications   Medication Sig Start Date End Date Taking? Authorizing Provider  ACCU-CHEK GUIDE test strip E11.9   TO CHECK BLOOD GLUCOSE TWICE A DAY 08/06/19  Yes [provider]  apixaban (ELIQUIS) 2.5 MG TABS tablet Take 2.5 mg by mouth 2 (two) times daily.   Yes [provider]  atorvastatin (LIPITOR) 40 MG tablet Take 1 tablet (40 mg total) by mouth daily at 6 PM. 12/03/17  Yes Demetrios Loll, MD  BD PEN NEEDLE NANO 2ND GEN 32G X 4 MM MISC USE NIGHTLY WITH TRESIBA 08/28/19  Yes [provider]  budesonide-formoterol (SYMBICORT) 160-4.5 MCG/ACT inhaler Inhale 2 puffs into the lungs 2 (two) times daily.   Yes [provider]  diltiazem (CARDIZEM CD) 180 MG 24 hr capsule Take 1 capsule (180 mg total) by mouth daily. 01/21/20 01/20/21 Yes Jennye Boroughs, MD  donepezil (ARICEPT) 5 MG tablet Take 5 mg by mouth at bedtime.   Yes [provider]  DULoxetine (CYMBALTA) 60 MG capsule Take 60 mg by mouth daily.   Yes [provider]  ergocalciferol (VITAMIN D2) 1.25 MG (50000 UT) capsule Take 50,000 Units by mouth once a week.   Yes [provider]  esomeprazole (NEXIUM) 40 MG capsule Take 40 mg by mouth daily at 12 noon.   Yes [provider]  ferrous sulfate 325 (65 FE) MG tablet Take 325 mg by mouth daily. 12/07/19  Yes [provider]   finasteride (PROSCAR) 5 MG tablet Take 5 mg by mouth daily.   Yes [provider]  formoterol (PERFOROMIST) 20 MCG/2ML nebulizer solution Inhale 2 mLs into the lungs 2 (two) times daily. 12/16/19 12/15/20 Yes [provider]  insulin degludec (TRESIBA)  100 UNIT/ML FlexTouch Pen Inject 30 Units into the skin at bedtime.   Yes [provider]  ipratropium-albuterol (DUONEB) 0.5-2.5 (3) MG/3ML SOLN Inhale 3 mLs into the lungs every 6 (six) hours as needed for shortness of breath. 01/22/17  Yes [provider]  latanoprost (XALATAN) 0.005 % ophthalmic solution latanoprost 0.005 % eye drops  INSTILL ONE DROP TO BOTH EYES AT BEDTIME.   Yes [provider]  levothyroxine (SYNTHROID) 88 MCG tablet Take 88 mcg by mouth every morning. 07/08/20  Yes [provider]  loratadine (CLARITIN) 10 MG tablet Take 10 mg by mouth daily. 12/17/19  Yes [provider]  Melatonin 3 MG TABS Take 3 mg by mouth at bedtime as needed (sleep).    Yes [provider]  metolazone (ZAROXOLYN) 2.5 MG tablet Take 1 tablet (2.5 mg total) by mouth daily for 5 days. Patient taking differently: Take 2.5 mg by mouth as directed. Take on weds and Sundays 02/07/20 09/17/2020 Yes Bradler, Vista Lawman, MD  metoprolol tartrate (LOPRESSOR) 50 MG tablet Take 1 tablet (50 mg total) by mouth 2 (two) times daily. Patient taking differently: Take 25 mg by mouth 2 (two) times daily. 01/21/20  Yes Jennye Boroughs, MD  NEURONTIN 100 MG capsule Take 100 mg by mouth 2 (two) times daily. 07/09/20  Yes [provider]  omega-3 acid ethyl esters (LOVAZA) 1 G capsule Take 1 g by mouth daily.    Yes [provider]  pantoprazole (PROTONIX) 40 MG tablet Take 40 mg by mouth daily. 06/04/19  Yes [provider]  Potassium Chloride ER 20 MEQ TBCR Take 1 tablet by mouth daily. 06/11/20  Yes [provider]  sildenafil (REVATIO) 20 MG tablet Take 20 mg by mouth 3 (three)  times daily. 05/09/19  Yes [provider]  tamsulosin (FLOMAX) 0.4 MG CAPS capsule Take 0.4 mg by mouth daily. 05/07/19  Yes [provider]  tiotropium (SPIRIVA) 18 MCG inhalation capsule Place 18 mcg into inhaler and inhale daily.   Yes [provider]  torsemide (DEMADEX) 20 MG tablet Take 1 tablet (20 mg total) by mouth daily as needed (for swelling). 01/21/20  Yes Jennye Boroughs, MD  budesonide (PULMICORT) 0.5 MG/2ML nebulizer solution Inhale 0.5 mg into the lungs 2 (two) times daily. For 10 days Patient not taking: Reported on 09/04/2020 12/11/19   [provider]  predniSONE (DELTASONE) 20 MG tablet Take 2 tablets (40 mg total) by mouth daily with breakfast. Patient not taking: No sig reported 07/18/20   Wouk, Ailene Rud, MD     Critical care time: 40 minutes    Disciplinary rounds were performed with CV team today.  Renold Don, MD Advanced Bronchoscopy PCCM [facility]    *This note was dictated using voice recognition software/Dragon.  Despite best efforts to proofread, errors can occur which can change the meaning.  Any change was purely unintentional.

## 2020-09-25 NOTE — Progress Notes (Signed)
Notified Dana NP regarding patients heart rate into 150's afib. She will review and place orders. Patient unable to tolerate PO.

## 2020-09-25 NOTE — Progress Notes (Signed)
[facility] Cardiology  SUBJECTIVE: Patient laying in bed undergoing hemodialysis   Vitals:   09/25/20 0615 09/25/20 0630 09/25/20 0700 09/25/20 0800  BP: (!) 83/70 104/80 100/70 (!) 88/65  Pulse: (!) 134 (!) 119 (!) 51 89  Resp: 12 16 (!) 22 14  Temp: (!) 96.4 F (35.8 C) (!) 96.3 F (35.7 C) (!) 96.3 F (35.7 C) (!) 96.3 F (35.7 C)  TempSrc:      SpO2: 100% 100% 100% 100%  Weight:      Height:         Intake/Output Summary (Last 24 hours) at 09/25/2020 [id number] Last data filed at 09/25/2020 0800 Gross per 24 hour  Intake 1109.23 ml  Output 1478 ml  Net -368.77 ml      PHYSICAL EXAM  General: Well developed, well nourished, in no acute distress HEENT:  Normocephalic and atramatic Neck:  No JVD.  Lungs: Clear bilaterally to auscultation and percussion. Heart: HRRR . Normal S1 and S2 without gallops or murmurs.  Abdomen: Bowel sounds are positive, abdomen soft and non-tender  Msk:  Back normal, normal gait. Normal strength and tone for age. Extremities: No clubbing, cyanosis or edema.   Neuro: Alert and oriented X 3. Psych:  Good affect, responds appropriately   LABS: Basic Metabolic Panel: Recent Labs    09/24/20 0431 09/24/20 2252  NA 130* 131*  K 5.4* 4.9  CL 90* 93*  CO2 27 29  GLUCOSE 124* 114*  BUN 113* 123*  CREATININE 2.86* 2.94*  CALCIUM 8.4* 8.1*  MG 2.5* 2.5*   Liver Function Tests: Recent Labs    09/23/20 0448 09/24/20 0431  AST 30 30  ALT 19 21  ALKPHOS 62 56  BILITOT 1.5* 1.0  PROT 6.9 6.6  ALBUMIN 3.3* 3.2*   No results for input(s): LIPASE, AMYLASE in the last 72 hours. CBC: Recent Labs    09/23/20 0448 09/24/20 0431 09/24/20 2252  WBC 5.4 6.8 5.5  NEUTROABS 4.8 6.2  --   HGB 8.1* 7.8* 6.9*  HCT 24.8* 24.3* 21.8*  MCV 87.9 88.4 92.0  PLT 151 153 125*   Cardiac Enzymes: No results for input(s): CKTOTAL, CKMB, CKMBINDEX, TROPONINI in the last 72 hours. BNP: Invalid input(s): POCBNP D-Dimer: Recent Labs    09/23/20 0448  09/24/20 0431  DDIMER <0.27 <0.27   Hemoglobin A1C: No results for input(s): HGBA1C in the last 72 hours. Fasting Lipid Panel: No results for input(s): CHOL, HDL, LDLCALC, TRIG, CHOLHDL, LDLDIRECT in the last 72 hours. Thyroid Function Tests: No results for input(s): TSH, T4TOTAL, T3FREE, THYROIDAB in the last 72 hours.  Invalid input(s): FREET3 Anemia Panel: Recent Labs    09/24/20 0431  FERRITIN 24    DG Chest Port 1 View  Result Date: 09/24/2020 CLINICAL DATA:  Acute respiratory failure EXAM: PORTABLE CHEST 1 VIEW COMPARISON:  09/21/2020, CT 08/06/2019, radiograph 07/16/2020, 02/16/2020 FINDINGS: Marked cardiomegaly with aortic atherosclerosis. Vascular congestion and probable mild edema. Suspect right pleural effusion. There is airspace disease at the right base. No pneumothorax. IMPRESSION: 1. Cardiomegaly with vascular congestion and probable mild edema. Suspect small right effusion. 2. Interval airspace disease at right base may reflect atelectasis Electronically Signed   By: Donavan Foil M.D.   On: 09/24/2020 19:12     Echo LVEF greater than 55% by 2D echocardiogram 07/17/2020  TELEMETRY: Atrial fibrillation 100 bpm:  ASSESSMENT AND PLAN:  Principal Problem:   COVID-19 virus infection Active Problems:   Bradycardia   Type 2 diabetes mellitus without complication (  HCC)   OSA (obstructive sleep apnea)   Atrial fibrillation with RVR (HCC)   Esophageal reflux   Essential hypertension   Spinal stenosis of lumbar region   BPH (benign prostatic hyperplasia)   Dementia without behavioral disturbance (HCC)   Acute on chronic diastolic CHF (congestive heart failure) (HCC)   PAH (pulmonary artery hypertension) (HCC)   History of CVA (cerebrovascular accident)   Chronic kidney disease, stage 3a (HCC)   COPD (chronic obstructive pulmonary disease) (HCC)   Chronic respiratory failure with hypoxia ([place])    1. Respiratory failure, multifactorial, secondary to acute on chronic  diastolic congestive heart failure, COPD exacerbation, with underlying COVID-19 infection, slowly improving, oxygen saturation 97% on 2 L nasal cannula 2.  Atrial fibrillation with rapid ventricular rate, compensatory for respiratory failure, COVID-19 infection and anemia, on p.o. metoprolol tartrate and diltiazem for rate control, and Eliquis for stroke prevention, rate improved, near baseline 100 bpm 3.  Chronic diastolic congestive heart failure, LVEF greater than 55% by 2D echocardiogram 07/17/2020 4.  COPD exacerbation 5.  COVID-19 infection, on IV remdesivir 6.  Chronic kidney disease, worsening BUN and creatinine 91 and 2.66 with GFR 23, started hemodialysis 7.  Anemia, hemoglobin adequate 7.0 and 23.0, respectively 8.  Pulmonary hypertension, on sildenafil   Recommendations   1.  Agree with overall current therapy 2.  Agree holding furosemide 3.  Resume metoprolol to tartrate 25 mg twice daily 4.  Resume apixaban 5 mg twice daily for stroke prevention 5.  Resume diltiazem as needed   Isaias Cowman, MD, PhD, [facility] 09/25/2020 8:23 AM

## 2020-09-25 NOTE — Progress Notes (Signed)
Updated pts son Derek Shelton via telephone and all questions were answered.  [name] appreciative of update. Will continue to monitor and assess pt.   Rosilyn Mings, AGNP  Pulmonary/Critical Care Pager [phone] (please enter 7 digits) PCCM Consult Pager [phone] (please enter 7 digits)

## 2020-09-25 NOTE — Progress Notes (Signed)
Called interpreter on language line. Interpreter unable to have conversation with patient. Placed monitor as close to patient as possible. Patient unable to answer orientation questions. Continue to monitor.

## 2020-09-25 NOTE — Progress Notes (Signed)
Elicited another video interpreter with patients wife at bedside. Patient able to communicate his name, date of birth and where he was at. Patient states that he is not having any pain at this time. Patient is up in bed eating his lunch with no complications. Interpreter helped answer wife's questions. Continue to monitor.

## 2020-09-25 NOTE — Progress Notes (Signed)
[place], [place] 09/25/20  Subjective:   Hospital day # 5  Patient known to our practice from outpatient and is followed patient was last seen in March 2021.  He has medical problems of chronic diastolic CHF, hypertension, insulin-dependent type 2 diabetes, coronary artery disease, dementia, atrial fibrillation, hypothyroidism, osteoarthritis, obstructive sleep apnea  Patient was admitted on 8/28 for evaluation of shortness of breath and home positive COVID test.  Patient is not able to provide any meaningful information because of illness and dementia.  All information is obtained from the chart as well as from primary team.  Patient appears to have a baseline CKD 3B with a creatinine of 1.7/GFR 40 at the time of admission.  It has progressively worsened and peaked at 2.86 on September 1.  In the setting of acute illness, rising BUN, creatinine, azotemia, nephrology team was consulted for evaluation and management of AKI.  Patient currently has a wet sounding cough.  He is requiring low-dose vasopressors.  CRRT was started overnight for azotemia and hyperkalemia  Renal: 09/01 0701 - 09/02 0700 In: 1090.5 [I.V.:741.8; Blood:348.8] Out: 1307 [Urine:900] Lab Results  Component Value Date   CREATININE 2.37 (H) 09/25/2020   CREATININE 2.94 (H) 09/24/2020   CREATININE 2.86 (H) 09/24/2020     Objective:  Vital signs in last 24 hours:  Temp:  [94.3 F (34.6 C)-109 F (42.8 C)] 96.4 F (35.8 C) (09/02 1000) Pulse Rate:  [50-148] 64 (09/02 1000) Resp:  [11-22] 11 (09/02 1000) BP: (76-122)/(60-93) 103/81 (09/02 1000) SpO2:  [93 %-100 %] 98 % (09/02 1000) Weight:  [81.8 kg-82.5 kg] 81.8 kg (09/02 0414)  Weight change: -11.6 kg Filed Weights   09/24/20 0447 09/24/20 1730 09/25/20 0414  Weight: 94.1 kg 82.5 kg 81.8 kg    Intake/Output:    Intake/Output Summary (Last 24 hours) at 09/25/2020 1102 Last data filed at 09/25/2020 1000 Gross per 24 hour   Intake 1135.51 ml  Output 1524 ml  Net -388.49 ml     Physical Exam: General: Critically ill-appearing, laying in the bed  HEENT Moist oral mucous membranes, anicteric sclera  Pulm/lungs Coarse breath sounds bilaterally, [place] O2  CVS/Heart Irregular rhythm, tachycardic  Abdomen:  Soft, nontender  Extremities: Trace edema  Neurologic: Alert, able to follow few simple commands   Skin: warm, dry  Access: Right femoral temporary dialysis catheter       Basic Metabolic Panel:  Recent Labs  Lab 09/22/20 0500 09/23/20 0448 09/24/20 0431 09/24/20 2252 09/25/20 0738  NA 133* 133* 130* 131* 131*  K 4.0 4.9 5.4* 4.9 4.4  CL 94* 93* 90* 93* 94*  CO2 '29 29 27 29 28  '$ GLUCOSE 230* 161* 124* 114* 132*  BUN 77* 91* 113* 123* 106*  CREATININE 2.11* 2.66* 2.86* 2.94* 2.37*  CALCIUM 8.3* 8.4* 8.4* 8.1* 8.4*  MG 2.3 2.5* 2.5* 2.5* 2.6*     CBC: Recent Labs  Lab 08/28/2020 2128 09/21/20 0344 09/22/20 0500 09/22/20 1944 09/23/20 0448 09/24/20 0431 09/24/20 2252 09/25/20 0738  WBC 3.5*   < > 2.8*  --  5.4 6.8 5.5 9.6  NEUTROABS 2.0  --  2.4  --  4.8 6.2  --  8.9*  HGB 7.0*   < > 6.6* 7.9* 8.1* 7.8* 6.9* 9.5*  HCT 22.9*   < > 21.9* 24.6* 24.8* 24.3* 21.8* 30.6*  MCV 92.0   < > 90.9  --  87.9 88.4 92.0 88.7  PLT 131*   < > 131*  --  151 153 125* 168   < > = values in this interval not displayed.     No results found for: HEPBSAG, HEPBSAB, HEPBIGM    Microbiology:  Recent Results (from the past 240 hour(s))  Urine Culture     Status: Abnormal   Collection Time: 08/30/2020  9:28 PM   Specimen: Urine, Random  Result Value Ref Range Status   Specimen Description   Final    URINE, RANDOM Performed at [facility], [address] [place], [place] [address]    Special Requests   Final    NONE Performed at [facility], [address], [place], [place] [address]    Culture (A)  Final    <10,000 COLONIES/mL INSIGNIFICANT GROWTH Performed at [facility], [place] [address] [place], [place] [address]    Report Status 09/23/2020 FINAL  Final  Blood Culture (routine x 2)     Status: None   Collection Time: 09/03/2020  9:28 PM   Specimen: BLOOD  Result Value Ref Range Status   Specimen Description BLOOD BLOOD RIGHT FOREARM  Final   Special Requests   Final    BOTTLES DRAWN AEROBIC AND ANAEROBIC Blood Culture results may not be optimal due to an inadequate volume of blood received in culture bottles   Culture   Final    NO GROWTH 5 DAYS Performed at [facility], [address] [place], [place] [address]    Report Status 09/25/2020 FINAL  Final  Blood Culture (routine x 2)     Status: None   Collection Time: 09/16/2020  9:28 PM   Specimen: BLOOD  Result Value Ref Range Status   Specimen Description BLOOD RIGHT ANTECUBITAL  Final   Special Requests   Final    BOTTLES DRAWN AEROBIC AND ANAEROBIC Blood Culture adequate volume   Culture   Final    NO GROWTH 5 DAYS Performed at [facility], [place] [place], [place] [address]    Report Status 09/25/2020 FINAL  Final  Resp Panel by RT-PCR (Flu A&B, Covid) Nasopharyngeal Swab     Status: Abnormal   Collection Time: 09/21/2020  9:28 PM   Specimen: Nasopharyngeal Swab; Nasopharyngeal(NP) swabs in vial transport medium  Result Value Ref Range Status   SARS Coronavirus 2 by RT PCR POSITIVE (A) NEGATIVE Final    Comment: RESULT CALLED TO, READ BACK BY AND VERIFIED WITH: MELISSA HOUP'@2253'$  08/26/2020 RH (NOTE) SARS-CoV-2 target nucleic acids are DETECTED.  The SARS-CoV-2 RNA is generally detectable in upper respiratory specimens during the acute phase of infection. Positive results are indicative of the presence of the identified virus, but do not rule out bacterial infection or co-infection with other pathogens not detected by the test. Clinical correlation with patient history and other diagnostic information is necessary to determine patient infection status. The  expected result is Negative.  Fact Sheet for Patients: EntrepreneurPulse.com.au  Fact Sheet for Healthcare Providers: IncredibleEmployment.be  This test is not yet approved or cleared by the [place] FDA and  has been authorized for detection and/or diagnosis of SARS-CoV-2 by FDA under an Emergency Use Authorization (EUA).  This EUA will remain in effect (meaning this test can be used ) for the duration of  the COVID-19 declaration under Section 564(b)(1) of the Act, 21 U.S.C. section 360bbb-3(b)(1), unless the authorization is terminated or revoked sooner.     Influenza A by PCR NEGATIVE NEGATIVE Final   Influenza B by PCR NEGATIVE NEGATIVE Final  Comment: (NOTE) The Xpert Xpress SARS-CoV-2/FLU/RSV plus assay is intended as an aid in the diagnosis of influenza from Nasopharyngeal swab specimens and should not be used as a sole basis for treatment. Nasal washings and aspirates are unacceptable for Xpert Xpress SARS-CoV-2/FLU/RSV testing.  Fact Sheet for Patients: EntrepreneurPulse.com.au  Fact Sheet for Healthcare Providers: IncredibleEmployment.be  This test is not yet approved or cleared by the [place] FDA and has been authorized for detection and/or diagnosis of SARS-CoV-2 by FDA under an Emergency Use Authorization (EUA). This EUA will remain in effect (meaning this test can be used) for the duration of the COVID-19 declaration under Section 564(b)(1) of the Act, 21 U.S.C. section 360bbb-3(b)(1), unless the authorization is terminated or revoked.  Performed at [facility], [address] [place], [place] [address]   MRSA Next Gen by PCR, Nasal     Status: None   Collection Time: 09/24/20  5:31 PM   Specimen: Nasal Mucosa; Nasal Swab  Result Value Ref Range Status   MRSA by PCR Next Gen NOT DETECTED NOT DETECTED Final    Comment: (NOTE) The GeneXpert MRSA Assay (FDA  approved for NASAL specimens only), is one component of a comprehensive MRSA colonization surveillance program. It is not intended to diagnose MRSA infection nor to guide or monitor treatment for MRSA infections. Test performance is not FDA approved in patients less than 90 years old. Performed at [facility], [address] [place],  [address]     Coagulation Studies: No results for input(s): LABPROT, INR in the last 72 hours.  Urinalysis: No results for input(s): COLORURINE, LABSPEC, PHURINE, GLUCOSEU, HGBUR, BILIRUBINUR, KETONESUR, PROTEINUR, UROBILINOGEN, NITRITE, LEUKOCYTESUR in the last 72 hours.  Invalid input(s): APPERANCEUR    Imaging: DG Chest Port 1 View  Result Date: 09/24/2020 CLINICAL DATA:  Acute respiratory failure EXAM: PORTABLE CHEST 1 VIEW COMPARISON:  09/09/2020, CT 08/06/2019, radiograph 07/16/2020, 02/16/2020 FINDINGS: Marked cardiomegaly with aortic atherosclerosis. Vascular congestion and probable mild edema. Suspect right pleural effusion. There is airspace disease at the right base. No pneumothorax. IMPRESSION: 1. Cardiomegaly with vascular congestion and probable mild edema. Suspect small right effusion. 2. Interval airspace disease at right base may reflect atelectasis Electronically Signed   By: Donavan Foil M.D.   On: 09/24/2020 19:12     Medications:    azithromycin 250 mg (09/25/20 1001)   norepinephrine (LEVOPHED) Adult infusion 3 mcg/min (09/25/20 1000)   prismasol BGK 2/2.5 dialysis solution 1,500 mL/hr at 09/25/20 1058   prismasol BGK 2/2.5 replacement solution 300 mL/hr at 09/25/20 0351   prismasol BGK 2/2.5 replacement solution 300 mL/hr at 09/25/20 0351    sodium chloride   Intravenous Once   vitamin C  500 mg Oral Daily   atorvastatin  40 mg Oral q1800   Chlorhexidine Gluconate Cloth  6 each Topical Q0600   cholecalciferol  1,000 Units Oral Daily   donepezil  5 mg Oral QHS   DULoxetine  60 mg Oral Daily   finasteride   5 mg Oral Daily   guaiFENesin  600 mg Oral BID   insulin aspart  0-9 Units Subcutaneous TID WC   insulin glargine-yfgn  24 Units Subcutaneous QHS   Ipratropium-Albuterol  2 puff Inhalation QID   latanoprost  1 drop Both Eyes QHS   levothyroxine  88 mcg Oral Q0600   loratadine  10 mg Oral Daily   methylPREDNISolone (SOLU-MEDROL) injection  40 mg Intravenous Q12H   mometasone-formoterol  2 puff Inhalation BID  omega-3 acid ethyl esters  1 g Oral Daily   pantoprazole  40 mg Oral Daily   sildenafil  20 mg Oral TID   tamsulosin  0.4 mg Oral Daily   tiotropium  18 mcg Inhalation Daily   Vitamin D (Ergocalciferol)  50,000 Units Oral Weekly   zinc sulfate  220 mg Oral Daily   acetaminophen **OR** acetaminophen, chlorpheniramine-HYDROcodone, guaiFENesin-dextromethorphan, heparin, ipratropium-albuterol, levalbuterol, magnesium hydroxide, melatonin, metoprolol tartrate, ondansetron **OR** ondansetron (ZOFRAN) IV, sodium chloride, traZODone  Assessment/ Plan:  80 y.o. male with  medical problems of chronic diastolic CHF, hypertension, insulin-dependent type 2 diabetes, coronary artery disease, dementia, atrial fibrillation, hypothyroidism, osteoarthritis, obstructive sleep apnea  admitted on 09/22/2020 for SOB (shortness of breath) [R06.02] COPD exacerbation (HCC) [J44.1] Acute CHF (congestive heart failure) (HCC) [I50.9] Acute on chronic diastolic congestive heart failure (HCC) [I50.33] Atrial fibrillation with RVR ([place]) [I48.91] COVID-19 [U07.1]  2D echo: June 29, 2020: LVEF greater than 55%, mild LVH, diastolic function indeterminate, moderately elevated pulmonary artery systolic pressure, estimated 45 mm, moderate to severe tricuspid regurgitation  #Acute kidney injury, oliguric #Hyperkalemia AKI likely secondary to ATN from concurrent illness, hypotension -CRRT started overnight to address azotemia, uremia, hyperkalemia and for metabolic optimization -Tolerating CRRT well.  Oxiris dialyzer  being used. Agree with holding potassium supplementation, torsemide -Currently getting Levophed for hypotension  #Acute on chronic hypoxic respiratory failure in setting of COPD, COVID-19 infection -Management as per ICU team -Currently getting Zithromax  #Pulmonary hypertension, severe tricuspid regurgitation -Currently keeping net volume balance even with CRRT  Critical care about 45 minutes   LOS: [place] 9/2/202211:02 AM  [facility] [place], [place]  Note: This note was prepared with Dragon dictation. Any transcription errors are unintentional

## 2020-09-26 DIAGNOSIS — U071 COVID-19: Secondary | ICD-10-CM | POA: Diagnosis not present

## 2020-09-26 LAB — TYPE AND SCREEN
ABO/RH(D): A POS
Antibody Screen: NEGATIVE
Unit division: 0

## 2020-09-26 LAB — CBC WITH DIFFERENTIAL/PLATELET
Abs Immature Granulocytes: 0.03 10*3/uL (ref 0.00–0.07)
Basophils Absolute: 0 10*3/uL (ref 0.0–0.1)
Basophils Relative: 0 %
Eosinophils Absolute: 0 10*3/uL (ref 0.0–0.5)
Eosinophils Relative: 0 %
HCT: 24.6 % — ABNORMAL LOW (ref 39.0–52.0)
Hemoglobin: 7.8 g/dL — ABNORMAL LOW (ref 13.0–17.0)
Immature Granulocytes: 1 %
Lymphocytes Relative: 4 %
Lymphs Abs: 0.3 10*3/uL — ABNORMAL LOW (ref 0.7–4.0)
MCH: 27.9 pg (ref 26.0–34.0)
MCHC: 31.7 g/dL (ref 30.0–36.0)
MCV: 87.9 fL (ref 80.0–100.0)
Monocytes Absolute: 0.5 10*3/uL (ref 0.1–1.0)
Monocytes Relative: 9 %
Neutro Abs: 5.4 10*3/uL (ref 1.7–7.7)
Neutrophils Relative %: 86 %
Platelets: 121 10*3/uL — ABNORMAL LOW (ref 150–400)
RBC: 2.8 MIL/uL — ABNORMAL LOW (ref 4.22–5.81)
RDW: 17.2 % — ABNORMAL HIGH (ref 11.5–15.5)
WBC: 6.2 10*3/uL (ref 4.0–10.5)
nRBC: 0 % (ref 0.0–0.2)

## 2020-09-26 LAB — COMPREHENSIVE METABOLIC PANEL
ALT: 20 U/L (ref 0–44)
AST: 29 U/L (ref 15–41)
Albumin: 2.8 g/dL — ABNORMAL LOW (ref 3.5–5.0)
Alkaline Phosphatase: 50 U/L (ref 38–126)
Anion gap: 3 — ABNORMAL LOW (ref 5–15)
BUN: 50 mg/dL — ABNORMAL HIGH (ref 8–23)
CO2: 32 mmol/L (ref 22–32)
Calcium: 8.8 mg/dL — ABNORMAL LOW (ref 8.9–10.3)
Chloride: 102 mmol/L (ref 98–111)
Creatinine, Ser: 1.16 mg/dL (ref 0.61–1.24)
GFR, Estimated: >60 mL/min (ref >60)
Glucose, Bld: 126 mg/dL — ABNORMAL HIGH (ref 70–99)
Potassium: 3.5 mmol/L (ref 3.5–5.1)
Sodium: 137 mmol/L (ref 135–145)
Total Bilirubin: 1 mg/dL (ref 0.3–1.2)
Total Protein: 5.8 g/dL — ABNORMAL LOW (ref 6.5–8.1)

## 2020-09-26 LAB — RENAL FUNCTION PANEL
Albumin: 2.7 g/dL — ABNORMAL LOW (ref 3.5–5.0)
Anion gap: 4 — ABNORMAL LOW (ref 5–15)
BUN: 37 mg/dL — ABNORMAL HIGH (ref 8–23)
CO2: 29 mmol/L (ref 22–32)
Calcium: 8.6 mg/dL — ABNORMAL LOW (ref 8.9–10.3)
Chloride: 102 mmol/L (ref 98–111)
Creatinine, Ser: 0.99 mg/dL (ref 0.61–1.24)
GFR, Estimated: >60 mL/min (ref >60)
Glucose, Bld: 120 mg/dL — ABNORMAL HIGH (ref 70–99)
Phosphorus: 2.5 mg/dL (ref 2.5–4.6)
Potassium: 3.4 mmol/L — ABNORMAL LOW (ref 3.5–5.1)
Sodium: 135 mmol/L (ref 135–145)

## 2020-09-26 LAB — C-REACTIVE PROTEIN: CRP: 0.6 mg/dL (ref <1.0)

## 2020-09-26 LAB — BPAM RBC
Blood Product Expiration Date: 202209302359
ISSUE DATE / TIME: 202209020255
Unit Type and Rh: 6200

## 2020-09-26 LAB — FERRITIN: Ferritin: 16 ng/mL — ABNORMAL LOW (ref 24–336)

## 2020-09-26 LAB — GLUCOSE, CAPILLARY
Glucose-Capillary: 114 mg/dL — ABNORMAL HIGH (ref 70–99)
Glucose-Capillary: 86 mg/dL (ref 70–99)
Glucose-Capillary: 122 mg/dL — ABNORMAL HIGH (ref 70–99)
Glucose-Capillary: 110 mg/dL — ABNORMAL HIGH (ref 70–99)

## 2020-09-26 LAB — PHOSPHORUS: Phosphorus: 2.9 mg/dL (ref 2.5–4.6)

## 2020-09-26 LAB — D-DIMER, QUANTITATIVE: D-Dimer, Quant: 0.56 ug/mL-FEU — ABNORMAL HIGH (ref 0.00–0.50)

## 2020-09-26 MED ORDER — PHENYLEPHRINE HCL (PRESSORS) 10 MG/ML IV SOLN
0.0000 ug/min | INTRAVENOUS | Status: DC
  Administered 2020-09-26: 10 ug/min via INTRAVENOUS
  Administered 2020-09-27 (×2): 75 ug/min via INTRAVENOUS
  Administered 2020-09-27 (×2): 95 ug/min via INTRAVENOUS
  Administered 2020-09-27: 75 ug/min via INTRAVENOUS
  Administered 2020-09-27: 95 ug/min via INTRAVENOUS
  Administered 2020-09-28: 85 ug/min via INTRAVENOUS
  Filled 2020-09-26 (×8): qty 2

## 2020-09-26 MED ORDER — DEXMEDETOMIDINE HCL IN NACL 400 MCG/100ML IV SOLN
0.4000 ug/kg/h | INTRAVENOUS | Status: DC
  Administered 2020-09-26 – 2020-09-28 (×5): 0.4 ug/kg/h via INTRAVENOUS
  Administered 2020-09-29 (×3): 1.6 ug/kg/h via INTRAVENOUS
  Filled 2020-09-26 (×8): qty 100

## 2020-09-26 MED ORDER — SODIUM CHLORIDE 0.9 % IV BOLUS
500.0000 mL | Freq: Once | INTRAVENOUS | Status: AC
  Administered 2020-09-26: 500 mL via INTRAVENOUS

## 2020-09-26 MED ORDER — MIDODRINE HCL 5 MG PO TABS
5.0000 mg | ORAL_TABLET | Freq: Three times a day (TID) | ORAL | Status: DC
  Administered 2020-09-26 – 2020-09-27 (×4): 5 mg via ORAL
  Filled 2020-09-26 (×4): qty 1

## 2020-09-26 NOTE — Progress Notes (Signed)
NAME:  Derek [place]ton, MRN:  [id number], DOB:  05/01/1940, LOS: 6 ADMISSION DATE:  09/15/2020, CONSULTATION DATE:  09/24/2020 REFERRING MD:  Dr. Loleta Books, CHIEF COMPLAINT:  Altered mental status, Acute on Chronic Hypoxic Respiratory Failure   Brief Pt Description / Synopsis:  80 year old male with a past medical history significant for COPD with FEV1 54% requiring 2 L home O2 at baseline, HFpEF, obesity, diabetes mellitus, coronary artery disease, hypertension, dementia and hypothyroidism admitted with acute on chronic hypoxic respiratory failure in the setting of acute COPD exacerbation and COVID-19 infection.  Course complicated by Acute Metabolic Encephalopathy in setting of AKI and Uremia requiring Dialysis.  History of Present Illness:  Derek Shelton is a 80 year old male (Pakistani speaking only) with a past medical history significant for COPD with FEV1 54% requiring 2 L supplemental O2 at home baseline, HFpEF, coronary artery disease, diabetes mellitus type 2, hypertension, dyslipidemia, and hypothyroidism who presented to [facility] ED on 09/24/2020 shortness of breath, productive cough, and wheezing.  He also endorsed nausea and poor appetite.  He denied vomiting, diarrhea, chest pain, palpitations, loss of taste or smell, dysuria.  Of note he did test positive for COVID with a home antigen test.  ED course: Vital signs: Pulse 120, respiratory rate 28, SPO2 88% on room air Labs: BUN 69, creatinine 1.75, BNP 288.7 COVID-19 PCR positive EKG with atrial fib with RVR and right bundle branch block Chest x-ray with cardiomegaly and central vascular congestion  He was admitted by the hospitalist for further work-up and treatment of acute on chronic hypoxic respiratory failure in the setting of acute COPD repeat x-ray showed COVID-19 infection, and acute decompensated HFpEF.  Cardiology was consulted.  Hospital course: On 09/24/2020, creatinine worsened to which diuretics were held and was given IV  fluids..  He was also noted to be more somnolent/lethargic with soft blood pressures.  He is being transferred to the stepdown unit for further work-up and treatment.  PCCM is consulted for further assistance with management of Acute Metabolic Encephalopathy, suspect secondary to Uremia.  Discussed with Nephrology, will plan for placement of temporary dialysis catheter placement and Dialysis.  Pertinent  Medical History  COPD on 2 L supplemental oxygen at baseline HFpEF Coronary artery disease Hypertension Hyperlipidemia Obesity Sleep apnea Dementia GERD GI bleed Hypothyroidism  Micro Data:  08/28/2020: SARS-CoV-2 PCR>> positive 09/10/2020: Influenza PCR>>negative 09/15/2020: Blood culture x2>>negative  09/23/2020: Urine culture>><10,000 colonies /mL insignificant growth   Antimicrobials:  Ceftriaxone 8/29 x1 dose Azithromycin 8/31>> Remdesivir 8/29>>9/1  Significant Hospital Events: Including procedures, antibiotic start and stop dates in addition to other pertinent events   09/24/20: Creatinine worsening, diuretics held. Pt with lethargy, transfer to ICU.  High risk for intubation.  Nephrology consulted for severe uremia.  Plan to place temporary dialysis catheter in anticipation of need for dialysis. 09/25/20: CRRT continued  09/26/20: Continues on CRRT  Interim History / Subjective:  Pt now able to follow commands and tolerate po diet; no longer requiring levophed gtt.  However, he continues to have rate control issues associated with chronic atrial fibrillation prn iv metoprolol ordered.  Per Dr. Saralyn Pilar patient's baseline rate is approximately 100.  Some agitation/sundowning last night, does have underlying history of dementia.  Did respond to brief use of Precedex then this was able to be discontinued  Objective   BP (!) 107/51   Pulse 89   Temp (!) 95.7 F (35.4 C)   Resp 17   Ht '5\' 8"'$  (1.727 m)   Wt 83.1  kg   SpO2 98%   BMI 27.86 kg/m          Intake/Output  Summary (Last 24 hours) at 09/26/2020 1006 Last data filed at 09/26/2020 1000 Gross per 24 hour  Intake 461.79 ml  Output 652.1 ml  Net -190.31 ml    Filed Weights   09/24/20 1730 09/25/20 0414 09/26/20 0500  Weight: 82.5 kg 81.8 kg 83.1 kg    Examination: Physical examination is limited due to need for PPE/CAPR General: Chronically ill-appearing male, laying in bed, awake and alert, in no acute distress.  On CRRT.  Watching TV HENT: Atraumatic, normocephalic, neck supple Lungs: Diminished breath sounds bilaterally with coarse breath sounds in bilateral upper fields, even, nonlabored Cardiovascular: Controlled ventricular response, irregular irregular rhythm (A. fib on telemetry), Abdomen: Obese, soft, nontender, nondistended, no guarding rebound tenderness, bowel sounds positive x4 Extremities: Normal bulk and tone, no deformities Neuro: Awake and alert, watching TV, no distress GU: External catheter in place  Resolved Hospital Problem list     Assessment & Plan:   Acute on Chronic Hypoxic Respiratory Failure in the setting of AECOPD, COVID-19 Infection, and suspected Acute Decompensated HFpEF PMHx of COPD on 2L supplemental O2 at baseline -Supplemental O2 as needed to maintain O2 sats >88% -Currently back to baseline O2 requirement -Follow intermittent Chest X-ray and ABG as needed -On CRRT -Remdesivir, plan for 5 days (to complete today) -IV steroids discontinued~09/2  -Continue scheduled and PRN bronchodilators via MDI -Continue Azithromycin -Follow inflammatory markers: Ferritin, D-dimer, CRP -Vitamin C, zinc  Acute on Chronic HFpEF Paroxsymal Atrial fibrillation A. Fib w/ RVR on admission Echocardiogram from 07/17/20 with LVEF >55%, unable to evaluate diastolic parameters, increased pulmonary artery systolic pressure, and moderate to severe tricuspid valve regurgitation -Continuous cardiac monitoring -Maintain MAP >65 -Vasopressors as needed to maintain MAP  goal -Cardiology following, appreciate input~-PRN iv metoprolol for hr >115  -BUN and creatinine improved, urine output still low, volume challenge -He will need higher filling pressures given pulmonary hypertension -Hold outpatient apixaban, cardizem, and metoprolol  AKI on CKD Stage IIIa (baseline Cr. 1.5) Hyperkalemia-resolved  Hyponatremia -Monitor I&O's / urinary output -Follow BMP -Ensure adequate renal perfusion -Avoid nephrotoxic agents as able -Replace electrolytes as indicated -Nephrology initiated ~CRRT 09/01, continues today  Acute Metabolic Encephalopathy, suspect secondary to Uremia also complicated by dementia~improving  -Provide supportive care -Avoid sedating meds as able -Precedex if needed -Frequent reorientation   Acute on chronic anemia~black tarry stools during hospitalization  -Monitor for S/Sx of bleeding -Trend CBC -Hold outpatient eliquis  -Transfuse for Hgb <7  Diabetes Mellitus -CBG's q4h; Target range of 140 to 180 -SSI and scheduled semglee  -Follow ICU Hypo/Hyperglycemia protocol  Best Practice (right click and "Reselect all SmartList Selections" daily)   Diet/type: Carb modified  DVT prophylaxis: SCD's  GI prophylaxis: PPI Lines: Right femoral trialysis catheter  Foley:  N/A Code Status:  full code Last date of multidisciplinary goals of care discussion [09/25/2020]  Labs   CBC: Recent Labs  Lab 09/22/20 0500 09/22/20 1944 09/23/20 0448 09/24/20 0431 09/24/20 2252 09/25/20 0738 09/26/20 0507  WBC 2.8*  --  5.4 6.8 5.5 9.6 6.2  NEUTROABS 2.4  --  4.8 6.2  --  8.9* 5.4  HGB 6.6*   < > 8.1* 7.8* 6.9* 9.5* 7.8*  HCT 21.9*   < > 24.8* 24.3* 21.8* 30.6* 24.6*  MCV 90.9  --  87.9 88.4 92.0 88.7 87.9  PLT 131*  --  151 153 125* 168 121*   < > =  values in this interval not displayed.     Basic Metabolic Panel: Recent Labs  Lab 09/22/20 0500 09/23/20 0448 09/24/20 0431 09/24/20 2252 09/25/20 0738 09/25/20 1703 09/26/20 0507   NA 133* 133* 130* 131* 131* 135 137  K 4.0 4.9 5.4* 4.9 4.4 4.1 3.5  CL 94* 93* 90* 93* 94* 99 102  CO2 '29 29 27 29 28 29 '$ 32  GLUCOSE 230* 161* 124* 114* 132* 135* 126*  BUN 77* 91* 113* 123* 106* 80* 50*  CREATININE 2.11* 2.66* 2.86* 2.94* 2.37* 1.68* 1.16  CALCIUM 8.3* 8.4* 8.4* 8.1* 8.4* 8.8* 8.8*  MG 2.3 2.5* 2.5* 2.5* 2.6*  --   --   PHOS  --   --   --   --   --  4.1 2.9    GFR: Estimated Creatinine Clearance: 52.5 mL/min (by C-G formula based on SCr of 1.16 mg/dL). Recent Labs  Lab 09/14/2020 2128 09/21/20 0344 09/22/20 0500 09/24/20 0431 09/24/20 2252 09/25/20 0738 09/26/20 0507  PROCALCITON  --  <0.10  --   --   --   --   --   WBC 3.5* 3.4*   < > 6.8 5.5 9.6 6.2  LATICACIDVEN 0.9 1.2  --   --   --   --   --    < > = values in this interval not displayed.     Liver Function Tests: Recent Labs  Lab 09/22/20 0500 09/23/20 0448 09/24/20 0431 09/25/20 0738 09/25/20 1703 09/26/20 0507  AST 33 '30 30 27  '$ --  29  ALT '18 19 21 22  '$ --  20  ALKPHOS 66 62 56 58  --  50  BILITOT 0.8 1.5* 1.0 1.3*  --  1.0  PROT 6.6 6.9 6.6 6.7  --  5.8*  ALBUMIN 3.1* 3.3* 3.2* 3.3* 3.0* 2.8*    No results for input(s): LIPASE, AMYLASE in the last 168 hours. No results for input(s): AMMONIA in the last 168 hours.  ABG    Component Value Date/Time   PHART 7.42 09/24/2020 1559   PCO2ART 44 09/24/2020 1559   PO2ART 77 (L) 09/24/2020 1559   HCO3 28.5 (H) 09/24/2020 1559   O2SAT 95.5 09/24/2020 1559      Coagulation Profile: Recent Labs  Lab 09/03/2020 2128  INR 1.7*     Cardiac Enzymes: No results for input(s): CKTOTAL, CKMB, CKMBINDEX, TROPONINI in the last 168 hours.  HbA1C: Hgb A1c MFr Bld  Date/Time Value Ref Range Status  09/22/2020 05:00 AM 6.6 (H) 4.8 - 5.6 % Final    Comment:    (NOTE)         Prediabetes: 5.7 - 6.4         Diabetes: >6.4         Glycemic control for adults with diabetes: <7.0   12/02/2017 12:14 PM 8.5 (H) 4.8 - 5.6 % Final    Comment:     (NOTE) Pre diabetes:          5.7%-6.4% Diabetes:              >6.4% Glycemic control for   <7.0% adults with diabetes     CBG: Recent Labs  Lab 09/25/20 1057 09/25/20 1603 09/25/20 1938 09/25/20 2215 09/26/20 0801  GLUCAP 145* 145* 150* 136* 114*     Review of Systems:   Unable to assess due to language barrier/CAPR.  Allergies Allergies  Allergen Reactions   Carvedilol Shortness Of Breath    Other reaction(s): Asthma,  Shortness of Breath   Penicillins Shortness Of Breath    Other reaction(s): Other (See Comments) Other reaction(s): RASH Other reaction(s): RASH Other reaction(s): RASH    Aspirin     Other reaction(s): Other (See Comments) On eliquis already- high risk of bleeding   Catapres [Clonidine Hcl]      Critical care time: 40 minutes    The patient is critically ill with multiple organ systems failure and requires high complexity decision making for assessment and support, frequent evaluation and titration of therapies, application of advanced monitoring technologies and extensive interpretation of multiple databases. Critical Care Time devoted to patient care services described in this note is  40 minutes.   Discussed with Dr. Anthonette Legato.  Renold Don, MD Advanced Bronchoscopy PCCM  [facility]    *This note was dictated using voice recognition software/Dragon.  Despite best efforts to proofread, errors can occur which can change the meaning.  Any change was purely unintentional.

## 2020-09-26 NOTE — Progress Notes (Signed)
[place], [place] 09/26/20  Subjective:   Hospital day # 6  Patient known to our practice from outpatient and is followed patient was last seen in March 2021.  He has medical problems of chronic diastolic CHF, hypertension, insulin-dependent type 2 diabetes, coronary artery disease, dementia, atrial fibrillation, hypothyroidism, osteoarthritis, obstructive sleep apnea.  Baseline creatinine 1.7 with an EGFR of 40.  Patient was admitted on 8/28 for evaluation of shortness of breath and home positive COVID test.  Patient is not able to provide any meaningful information because of illness and dementia.  All information is obtained from the chart as well as from primary team.  -Azotemia significantly improved.  BUN down to 50 with creatinine down to 1.16.  However urine output remains low at 440 cc over the preceding 24 hours.  Patient awake and alert this AM.  Able to converse as writer able to speak to patient's native language.  Renal: 09/02 0701 - 09/03 0700 In: 302.5 [P.O.:100; I.V.:77.6; IV Piggyback:124.9] Out: 869 [Urine:440] Lab Results  Component Value Date   CREATININE 1.16 09/26/2020   CREATININE 1.68 (H) 09/25/2020   CREATININE 2.37 (H) 09/25/2020     Objective:  Vital signs in last 24 hours:  Temp:  [95.7 F (35.4 C)-97.5 F (36.4 C)] 96.6 F (35.9 C) (09/03 1000) Pulse Rate:  [40-137] 41 (09/03 1000) Resp:  [12-21] 20 (09/03 1000) BP: (69-124)/(39-98) 97/72 (09/03 1000) SpO2:  [94 %-100 %] 100 % (09/03 1000) Weight:  [83.1 kg] 83.1 kg (09/03 0500)  Weight change: 0.6 kg Filed Weights   09/24/20 1730 09/25/20 0414 09/26/20 0500  Weight: 82.5 kg 81.8 kg 83.1 kg    Intake/Output:    Intake/Output Summary (Last 24 hours) at 09/26/2020 1017 Last data filed at 09/26/2020 1000 Gross per 24 hour  Intake 461.79 ml  Output 761.2 ml  Net -299.41 ml      Physical Exam: General: Critically ill-appearing, laying in the bed  HEENT Moist  oral mucous membranes, anicteric sclera  Pulm/lungs Coarse breath sounds bilaterally, [place] O2  CVS/Heart Irregular rhythm, tachycardic  Abdomen:  Soft, nontender  Extremities: Trace edema  Neurologic: Alert, able to follow few simple commands   Skin: warm, dry  Access: Right femoral temporary dialysis catheter       Basic Metabolic Panel:  Recent Labs  Lab 09/22/20 0500 09/23/20 0448 09/24/20 0431 09/24/20 2252 09/25/20 0738 09/25/20 1703 09/26/20 0507  NA 133* 133* 130* 131* 131* 135 137  K 4.0 4.9 5.4* 4.9 4.4 4.1 3.5  CL 94* 93* 90* 93* 94* 99 102  CO2 _0 32  GLUCOSE 230* 161* 124* 114* 132* 135* 126*  BUN 77* 91* 113* 123* 106* 80* 50*  CREATININE 2.11* 2.66* 2.86* 2.94* 2.37* 1.68* 1.16  CALCIUM 8.3* 8.4* 8.4* 8.1* 8.4* 8.8* 8.8*  MG 2.3 2.5* 2.5* 2.5* 2.6*  --   --   PHOS  --   --   --   --   --  4.1 2.9      CBC: Recent Labs  Lab 09/22/20 0500 09/22/20 1944 09/23/20 0448 09/24/20 0431 09/24/20 2252 09/25/20 0738 09/26/20 0507  WBC 2.8*  --  5.4 6.8 5.5 9.6 6.2  NEUTROABS 2.4  --  4.8 6.2  --  8.9* 5.4  HGB 6.6*   < > 8.1* 7.8* 6.9* 9.5* 7.8*  HCT 21.9*   < > 24.8* 24.3* 21.8* 30.6* 24.6*  MCV 90.9  --  87.9  88.4 92.0 88.7 87.9  PLT 131*  --  151 153 125* 168 121*   < > = values in this interval not displayed.      No results found for: HEPBSAG, HEPBSAB, HEPBIGM    Microbiology:  Recent Results (from the past 240 hour(s))  Urine Culture     Status: Abnormal   Collection Time: 09/14/2020  9:28 PM   Specimen: Urine, Random  Result Value Ref Range Status   Specimen Description   Final    URINE, RANDOM Performed at [facility], [address] [place], [place] [address]    Special Requests   Final    NONE Performed at [name], [address], [place], [place] [address]    Culture (A)  Final    <10,000 COLONIES/mL INSIGNIFICANT GROWTH Performed at [facility], [place] [address], [place], [place]  [address]    Report Status 09/23/2020 FINAL  Final  Blood Culture (routine x 2)     Status: None   Collection Time: 09/06/2020  9:28 PM   Specimen: BLOOD  Result Value Ref Range Status   Specimen Description BLOOD BLOOD RIGHT FOREARM  Final   Special Requests   Final    BOTTLES DRAWN AEROBIC AND ANAEROBIC Blood Culture results may not be optimal due to an inadequate volume of blood received in culture bottles   Culture   Final    NO GROWTH 5 DAYS Performed at [facility], [address] [place], [place] [address]    Report Status 09/25/2020 FINAL  Final  Blood Culture (routine x 2)     Status: None   Collection Time: 09/14/2020  9:28 PM   Specimen: BLOOD  Result Value Ref Range Status   Specimen Description BLOOD RIGHT ANTECUBITAL  Final   Special Requests   Final    BOTTLES DRAWN AEROBIC AND ANAEROBIC Blood Culture adequate volume   Culture   Final    NO GROWTH 5 DAYS Performed at [facility], [address] [place],  [address]    Report Status 09/25/2020 FINAL  Final  Resp Panel by RT-PCR (Flu A&B, Covid) Nasopharyngeal Swab     Status: Abnormal   Collection Time: 09/19/2020  9:28 PM   Specimen: Nasopharyngeal Swab; Nasopharyngeal(NP) swabs in vial transport medium  Result Value Ref Range Status   SARS Coronavirus 2 by RT PCR POSITIVE (A) NEGATIVE Final    Comment: RESULT CALLED TO, READ BACK BY AND VERIFIED WITH: MELISSA [email or web address]  09/10/2020 RH (NOTE) SARS-CoV-2 target nucleic acids are DETECTED.  The SARS-CoV-2 RNA is generally detectable in upper respiratory specimens during the acute phase of infection. Positive results are indicative of the presence of the identified virus, but do not rule out bacterial infection or co-infection with other pathogens not detected by the test. Clinical correlation with patient history and other diagnostic information is necessary to determine patient infection status. The expected result is Negative.  Fact Sheet for  Patients: EntrepreneurPulse.com.au  Fact Sheet for Healthcare Providers: IncredibleEmployment.be  This test is not yet approved or cleared by the [place] FDA and  has been authorized for detection and/or diagnosis of SARS-CoV-2 by FDA under an Emergency Use Authorization (EUA).  This EUA will remain in effect (meaning this test can be used ) for the duration of  the COVID-19 declaration under Section 564(b)(1) of the Act, 21 U.S.C. section 360bbb-3(b)(1), unless the authorization is terminated or revoked sooner.     Influenza A by PCR  NEGATIVE NEGATIVE Final   Influenza B by PCR NEGATIVE NEGATIVE Final    Comment: (NOTE) The Xpert Xpress SARS-CoV-2/FLU/RSV plus assay is intended as an aid in the diagnosis of influenza from Nasopharyngeal swab specimens and should not be used as a sole basis for treatment. Nasal washings and aspirates are unacceptable for Xpert Xpress SARS-CoV-2/FLU/RSV testing.  Fact Sheet for Patients: EntrepreneurPulse.com.au  Fact Sheet for Healthcare Providers: IncredibleEmployment.be  This test is not yet approved or cleared by the [place] FDA and has been authorized for detection and/or diagnosis of SARS-CoV-2 by FDA under an Emergency Use Authorization (EUA). This EUA will remain in effect (meaning this test can be used) for the duration of the COVID-19 declaration under Section 564(b)(1) of the Act, 21 U.S.C. section 360bbb-3(b)(1), unless the authorization is terminated or revoked.  Performed at [facility], [address] [place], [place] [address]   MRSA Next Gen by PCR, Nasal     Status: None   Collection Time: 09/24/20  5:31 PM   Specimen: Nasal Mucosa; Nasal Swab  Result Value Ref Range Status   MRSA by PCR Next Gen NOT DETECTED NOT DETECTED Final    Comment: (NOTE) The GeneXpert MRSA Assay (FDA approved for NASAL specimens only), is one  component of a comprehensive MRSA colonization surveillance program. It is not intended to diagnose MRSA infection nor to guide or monitor treatment for MRSA infections. Test performance is not FDA approved in patients less than 61 years old. Performed at [facility], [address] [place], [place] [address]     Coagulation Studies: No results for input(s): LABPROT, INR in the last 72 hours.  Urinalysis: No results for input(s): COLORURINE, LABSPEC, PHURINE, GLUCOSEU, HGBUR, BILIRUBINUR, KETONESUR, PROTEINUR, UROBILINOGEN, NITRITE, LEUKOCYTESUR in the last 72 hours.  Invalid input(s): APPERANCEUR    Imaging: DG Chest Port 1 View  Result Date: 09/24/2020 CLINICAL DATA:  Acute respiratory failure EXAM: PORTABLE CHEST 1 VIEW COMPARISON:  09/15/2020, CT 08/06/2019, radiograph 07/16/2020, 02/16/2020 FINDINGS: Marked cardiomegaly with aortic atherosclerosis. Vascular congestion and probable mild edema. Suspect right pleural effusion. There is airspace disease at the right base. No pneumothorax. IMPRESSION: 1. Cardiomegaly with vascular congestion and probable mild edema. Suspect small right effusion. 2. Interval airspace disease at right base may reflect atelectasis Electronically Signed   By: Donavan Foil M.D.   On: 09/24/2020 19:12     Medications:    azithromycin 125 mL/hr at 09/26/20 1000   dexmedetomidine (PRECEDEX) IV infusion Stopped (09/26/20 0535)   norepinephrine (LEVOPHED) Adult infusion Stopped (09/25/20 1253)   prismasol BGK 2/2.5 dialysis solution 1,500 mL/hr at 09/25/20 1714   prismasol BGK 2/2.5 replacement solution 300 mL/hr at 09/25/20 0351   prismasol BGK 2/2.5 replacement solution 300 mL/hr at 09/25/20 0351    sodium chloride   Intravenous Once   vitamin C  500 mg Oral Daily   atorvastatin  40 mg Oral q1800   Chlorhexidine Gluconate Cloth  6 each Topical Q0600   cholecalciferol  1,000 Units Oral Daily   donepezil  5 mg Oral QHS   DULoxetine  60 mg  Oral Daily   feeding supplement (NEPRO CARB STEADY)  237 mL Oral TID BM   finasteride  5 mg Oral Daily   guaiFENesin  600 mg Oral BID   insulin aspart  0-9 Units Subcutaneous TID WC   insulin glargine-yfgn  24 Units Subcutaneous QHS   Ipratropium-Albuterol  2 puff Inhalation QID   latanoprost  1 drop Both Eyes  QHS   levothyroxine  88 mcg Oral Q0600   loratadine  10 mg Oral Daily   mometasone-formoterol  2 puff Inhalation BID   multivitamin  1 tablet Oral QHS   omega-3 acid ethyl esters  1 g Oral Daily   pantoprazole  40 mg Oral Daily   sildenafil  20 mg Oral TID   tamsulosin  0.4 mg Oral Daily   tiotropium  18 mcg Inhalation Daily   Vitamin D (Ergocalciferol)  50,000 Units Oral Weekly   zinc sulfate  220 mg Oral Daily   acetaminophen **OR** acetaminophen, chlorpheniramine-HYDROcodone, guaiFENesin-dextromethorphan, heparin, ipratropium-albuterol, levalbuterol, magnesium hydroxide, melatonin, metoprolol tartrate, ondansetron **OR** ondansetron (ZOFRAN) IV, sodium chloride, traZODone  Assessment/ Plan:  80 y.o. male with  medical problems of chronic diastolic CHF, hypertension, insulin-dependent type 2 diabetes, coronary artery disease, dementia, atrial fibrillation, hypothyroidism, osteoarthritis, obstructive sleep apnea  admitted on 09/18/2020 for SOB (shortness of breath) [R06.02] COPD exacerbation (HCC) [J44.1] Acute CHF (congestive heart failure) (HCC) [I50.9] Acute on chronic diastolic congestive heart failure (HCC) [I50.33] Atrial fibrillation with RVR ([place]) [I48.91] COVID-19 [U07.1]  2D echo: June 29, 2020: LVEF greater than 55%, mild LVH, diastolic function indeterminate, moderately elevated pulmonary artery systolic pressure, estimated 45 mm, moderate to severe tricuspid regurgitation  #Acute kidney injury, oliguric #Hyperkalemia #Chronic kidney disease stage IIIb baseline EGFR 40 AKI likely secondary to ATN from concurrent illness, hypotension -CRRT started overnight to  address azotemia, uremia, hyperkalemia and for metabolic optimization -Azotemia significantly improved as BUN down to 50.  However urine output only 440 cc over the preceding 24 hours.  Therefore we will maintain the patient on CRRT.  Serum potassium acceptable and serum bicarbonate also at target at 32.  #Acute on chronic hypoxic respiratory failure in setting of COPD, COVID-19 infection -Currently on nasal cannula and breathing comfortably.  #Pulmonary hypertension, severe tricuspid regurgitation -Currently keeping net volume balance even with CRRT   LOS: 6 Arla Boutwell 9/3/202210:17 AM  [place], [place]  Note: This note was prepared with Dragon dictation. Any transcription errors are unintentional

## 2020-09-26 NOTE — Progress Notes (Signed)
Patient noted restless, attempting to get out of bed, and pulling on his trialysis catheter resulting in the CRRT machine to alarm multiple times. After attempting to redirect patient multiple times, Britton[place]Lee, NP notified. Precedex 0.4 mcg/kg/hr started as ordered. Patient currently watching tv without incident. Will continue to monitor.

## 2020-09-26 NOTE — Progress Notes (Addendum)
PT Cancellation Note  Patient Details Name: JOSAFAT FICKERT MRN: [id number] DOB: [birth year]   Cancelled Treatment:    Reason Eval/Treat Not Completed: Medical issues which prohibited therapy Pt had PT eval 8/31, transferred to step-down unit 9/1, also received temp femoral dialysis cath that date.  Since that time pt has had unstable vitals including consistently <100 HR (reaching 150 yesterday AM) for much of 9/2 and later having continued cardiac lability (40s to 120s bpm) this AM.  Pt also now with significant hypotension and low temperature - given his signicant change in status and needed increased level of care since PT exam along with continued unstable vitals PT orders will be completed at this time.  Will need new orders when he appropriate for PT.  Signing off for now.  Kreg Shropshire, DPT 09/26/2020, 7:54 AM

## 2020-09-27 DIAGNOSIS — U071 COVID-19: Secondary | ICD-10-CM | POA: Diagnosis not present

## 2020-09-27 LAB — CBC WITH DIFFERENTIAL/PLATELET
Abs Immature Granulocytes: 0.05 10*3/uL (ref 0.00–0.07)
Basophils Absolute: 0 10*3/uL (ref 0.0–0.1)
Basophils Relative: 0 %
Eosinophils Absolute: 0 10*3/uL (ref 0.0–0.5)
Eosinophils Relative: 0 %
HCT: 30.3 % — ABNORMAL LOW (ref 39.0–52.0)
Hemoglobin: 9.5 g/dL — ABNORMAL LOW (ref 13.0–17.0)
Immature Granulocytes: 1 %
Lymphocytes Relative: 6 %
Lymphs Abs: 0.6 10*3/uL — ABNORMAL LOW (ref 0.7–4.0)
MCH: 28.6 pg (ref 26.0–34.0)
MCHC: 31.4 g/dL (ref 30.0–36.0)
MCV: 91.3 fL (ref 80.0–100.0)
Monocytes Absolute: 0.9 10*3/uL (ref 0.1–1.0)
Monocytes Relative: 10 %
Neutro Abs: 7.8 10*3/uL — ABNORMAL HIGH (ref 1.7–7.7)
Neutrophils Relative %: 83 %
Platelets: 153 10*3/uL (ref 150–400)
RBC: 3.32 MIL/uL — ABNORMAL LOW (ref 4.22–5.81)
RDW: 17.3 % — ABNORMAL HIGH (ref 11.5–15.5)
WBC: 9.3 10*3/uL (ref 4.0–10.5)
nRBC: 0.5 % — ABNORMAL HIGH (ref 0.0–0.2)

## 2020-09-27 LAB — RENAL FUNCTION PANEL
Albumin: 2.9 g/dL — ABNORMAL LOW (ref 3.5–5.0)
Albumin: 2.8 g/dL — ABNORMAL LOW (ref 3.5–5.0)
Anion gap: 4 — ABNORMAL LOW (ref 5–15)
Anion gap: 3 — ABNORMAL LOW (ref 5–15)
BUN: 29 mg/dL — ABNORMAL HIGH (ref 8–23)
BUN: 24 mg/dL — ABNORMAL HIGH (ref 8–23)
CO2: 29 mmol/L (ref 22–32)
CO2: 28 mmol/L (ref 22–32)
Calcium: 8.4 mg/dL — ABNORMAL LOW (ref 8.9–10.3)
Calcium: 8.1 mg/dL — ABNORMAL LOW (ref 8.9–10.3)
Chloride: 103 mmol/L (ref 98–111)
Chloride: 104 mmol/L (ref 98–111)
Creatinine, Ser: 0.94 mg/dL (ref 0.61–1.24)
Creatinine, Ser: 0.79 mg/dL (ref 0.61–1.24)
GFR, Estimated: >60 mL/min (ref >60)
GFR, Estimated: >60 mL/min (ref >60)
Glucose, Bld: 77 mg/dL (ref 70–99)
Glucose, Bld: 114 mg/dL — ABNORMAL HIGH (ref 70–99)
Phosphorus: 2.1 mg/dL — ABNORMAL LOW (ref 2.5–4.6)
Phosphorus: 1.9 mg/dL — ABNORMAL LOW (ref 2.5–4.6)
Potassium: 3.2 mmol/L — ABNORMAL LOW (ref 3.5–5.1)
Potassium: 3.4 mmol/L — ABNORMAL LOW (ref 3.5–5.1)
Sodium: 136 mmol/L (ref 135–145)
Sodium: 135 mmol/L (ref 135–145)

## 2020-09-27 LAB — GLUCOSE, CAPILLARY
Glucose-Capillary: 60 mg/dL — ABNORMAL LOW (ref 70–99)
Glucose-Capillary: 71 mg/dL (ref 70–99)
Glucose-Capillary: 94 mg/dL (ref 70–99)
Glucose-Capillary: 117 mg/dL — ABNORMAL HIGH (ref 70–99)
Glucose-Capillary: 120 mg/dL — ABNORMAL HIGH (ref 70–99)
Glucose-Capillary: 140 mg/dL — ABNORMAL HIGH (ref 70–99)

## 2020-09-27 LAB — MAGNESIUM: Magnesium: 2 mg/dL (ref 1.7–2.4)

## 2020-09-27 MED ORDER — SODIUM CHLORIDE 0.9 % IV BOLUS
250.0000 mL | Freq: Once | INTRAVENOUS | Status: AC
  Administered 2020-09-27: 250 mL via INTRAVENOUS

## 2020-09-27 MED ORDER — PRISMASOL BGK 4/2.5 32-4-2.5 MEQ/L REPLACEMENT SOLN
Status: DC
  Filled 2020-09-27: qty 5000

## 2020-09-27 MED ORDER — MIDODRINE HCL 5 MG PO TABS
10.0000 mg | ORAL_TABLET | Freq: Three times a day (TID) | ORAL | Status: DC
  Administered 2020-09-28 – 2020-09-29 (×5): 10 mg via ORAL
  Filled 2020-09-27 (×5): qty 2

## 2020-09-27 MED ORDER — PRISMASOL BGK 4/2.5 32-4-2.5 MEQ/L EC SOLN: Status: DC

## 2020-09-27 MED ORDER — SODIUM CHLORIDE 0.9 % IV BOLUS
500.0000 mL | Freq: Once | INTRAVENOUS | Status: AC
  Administered 2020-09-27: 500 mL via INTRAVENOUS

## 2020-09-27 NOTE — Progress Notes (Addendum)
At change of shift the patients son was speaking with patients wife via facetime and told day RN that the patient was c/o pain in his hips. Attempted to use interpreter, patient not willing to speak with interpreter or answer questions Tylenol PRN given for pain and will continue to reassess pain level and encourage the patient to reposition.

## 2020-09-27 NOTE — Progress Notes (Signed)
Pt net goal for fluid this shift was to be positive 1 Liter. Pt received 1 L total in boluses this shift to be independent of fluid removal with CRRT.

## 2020-09-27 NOTE — Progress Notes (Signed)
NAME:  Derek Shelton, MRN:  [id number], DOB:  05/14/1940, LOS: 7 ADMISSION DATE:  09/15/2020, CONSULTATION DATE:  09/24/2020 REFERRING MD:  Dr. Loleta Books, CHIEF COMPLAINT:  Altered mental status, Acute on Chronic Hypoxic Respiratory Failure   Brief Pt Description / Synopsis:  80 year old male with a past medical history significant for COPD with FEV1 54% requiring 2 L home O2 at baseline, HFpEF, obesity, diabetes mellitus, coronary artery disease, hypertension, dementia and hypothyroidism admitted with acute on chronic hypoxic respiratory failure in the setting of acute COPD exacerbation and COVID-19 infection.  Course complicated by Acute Metabolic Encephalopathy in setting of AKI and Uremia requiring Dialysis.  History of Present Illness:  Derek Shelton is a 80 year old male (Pakistani speaking only) with a past medical history significant for COPD with FEV1 54% requiring 2 L supplemental O2 at home baseline, HFpEF, coronary artery disease, diabetes mellitus type 2, hypertension, dyslipidemia, and hypothyroidism who presented to [facility] ED on 09/24/2020 shortness of breath, productive cough, and wheezing.  He also endorsed nausea and poor appetite.  He denied vomiting, diarrhea, chest pain, palpitations, loss of taste or smell, dysuria.  Of note he did test positive for COVID with a home antigen test.  ED course: Vital signs: Pulse 120, respiratory rate 28, SPO2 88% on room air Labs: BUN 69, creatinine 1.75, BNP 288.7 COVID-19 PCR positive EKG with atrial fib with RVR and right bundle branch block Chest x-ray with cardiomegaly and central vascular congestion  He was admitted by the hospitalist for further work-up and treatment of acute on chronic hypoxic respiratory failure in the setting of acute COPD repeat x-ray showed COVID-19 infection, and acute decompensated HFpEF.  Cardiology was consulted.  Hospital course: On 09/24/2020, creatinine worsened to which diuretics were held and was given IV  fluids..  He was also noted to be more somnolent/lethargic with soft blood pressures.  He is being transferred to the stepdown unit for further work-up and treatment.  PCCM is consulted for further assistance with management of Acute Metabolic Encephalopathy, suspect secondary to Uremia.  Discussed with Nephrology, will plan for placement of temporary dialysis catheter placement and Dialysis.  Pertinent  Medical History  COPD on 2 L supplemental oxygen at baseline HFpEF Coronary artery disease Hypertension Hyperlipidemia Obesity Sleep apnea Dementia GERD GI bleed Hypothyroidism  Micro Data:  09/16/2020: SARS-CoV-2 PCR>> positive 08/28/2020: Influenza PCR>>negative 08/24/2020: Blood culture x2>>negative  09/23/2020: Urine culture>><10,000 colonies /mL insignificant growth   Antimicrobials:  Ceftriaxone 8/29 x1 dose Azithromycin 8/31>> Remdesivir 8/29>>9/1  Significant Hospital Events: Including procedures, antibiotic start and stop dates in addition to other pertinent events   09/24/20: Creatinine worsening, diuretics held. Pt with lethargy, transfer to ICU.  High risk for intubation.  Nephrology consulted for severe uremia.  Plan to place temporary dialysis catheter in anticipation of need for dialysis. 09/25/20: CRRT continued  09/26/20: Continues on CRRT 09/27/20: Continues on CRRT  Interim History / Subjective:  Pt now able to follow commands and tolerate po diet; he requires pressors intermittently, however, he continues to have rate control issues associated with chronic atrial fibrillation prn iv metoprolol ordered.  Per Dr. Saralyn Pilar patient's baseline rate is approximately 100-120.  Some agitation/sundowning last night, does have underlying history of dementia.    Several episodes of hypotension today that responded to volume challenge.  Objective   BP 95/71   Pulse (!) 54   Temp (!) 96.1 F (35.6 C)   Resp 13   Ht '5\' 8"'$  (1.727 m)   Wt 84.3  kg   SpO2 100%   BMI 28.26  kg/m          Intake/Output Summary (Last 24 hours) at 09/27/2020 0845 Last data filed at 09/27/2020 0800 Gross per 24 hour  Intake 1491.64 ml  Output 634.1 ml  Net 857.54 ml    Filed Weights   09/25/20 0414 09/26/20 0500 09/27/20 0435  Weight: 81.8 kg 83.1 kg 84.3 kg    Examination: Physical examination is limited due to need for PPE/CAPR General: Chronically ill-appearing male, laying in bed, awake and alert, in no acute distress.  On CRRT.  HENT: Atraumatic, normocephalic, neck supple Lungs: Diminished breath sounds bilaterally with coarse breath sounds in bilateral upper fields, even, nonlabored Cardiovascular: Controlled ventricular response, irregular irregular rhythm (A. fib on telemetry), Abdomen: Obese, soft, nontender, nondistended, no guarding rebound tenderness, bowel sounds positive x4 Extremities: Normal bulk and tone, no deformities Neuro: Awake and alert, watching TV, no distress GU: External catheter in place  Resolved Hospital Problem list     Assessment & Plan:   Acute on Chronic Hypoxic Respiratory Failure in the setting of AECOPD, COVID-19 Infection, and suspected Acute Decompensated HFpEF PMHx of COPD on 2L supplemental O2 at baseline -Supplemental O2 as needed to maintain O2 sats >88% -Currently back to baseline O2 requirement -Follow intermittent Chest X-ray and ABG as needed -On CRRT -Remdesivir, completed 5 days -IV steroids discontinued~09/2  -Continue scheduled and PRN bronchodilators via MDI -Completed Azithromycin -Vitamin C, zinc  Acute on Chronic HFpEF Paroxsymal Atrial fibrillation A. 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 1.5) Hyperkalemia-resolved  Hyponatremia -Monitor I&O's / urinary output -Follow BMP -Ensure adequate renal perfusion -Avoid nephrotoxic agents as able -Replace electrolytes as indicated -Nephrology initiated ~CRRT 09/01, continues today -Question DC CRRT  Acute Metabolic Encephalopathy, suspect secondary to Uremia also complicated by dementia~improving  -Provide supportive care -Avoid sedating meds as able -Precedex if needed -Frequent reorientation, challenging due to language barrier  Acute on chronic anemia~black tarry stools during hospitalization  -Monitor for S/Sx of bleeding -Trend CBC -Hold outpatient eliquis  -Transfuse for Hgb <7  Diabetes Mellitus -CBG's q4h; Target range of 140 to 180 -SSI and scheduled semglee  -Follow ICU Hypo/Hyperglycemia protocol  Best Practice (right click and "Reselect all SmartList Selections" daily)   Diet/type: Carb modified  DVT prophylaxis: SCD's  GI prophylaxis: PPI Lines: Right femoral trialysis catheter  Foley:  N/A Code Status:  full code Last date of multidisciplinary goals of care discussion [09/25/2020]  Labs   CBC: Recent Labs  Lab 09/23/20 0448 09/24/20 0431 09/24/20 2252 09/25/20 0738 09/26/20 0507 09/27/20 0635  WBC 5.4 6.8 5.5 9.6 6.2 9.3  NEUTROABS 4.8 6.2  --  8.9* 5.4 7.8*  HGB 8.1* 7.8* 6.9* 9.5* 7.8* 9.5*  HCT 24.8* 24.3* 21.8* 30.6* 24.6* 30.3*  MCV 87.9 88.4 92.0 88.7 87.9 91.3  PLT 151 153 125* 168 121* 153     Basic Metabolic Panel: Recent Labs  Lab 09/23/20 0448 09/24/20 0431 09/24/20 2252 09/25/20 [id number]  09/25/20 1703 09/26/20 0507 09/26/20 1708 09/27/20 0635  NA 133*  130* 131* 131* 135 137 135 136  K 4.9 5.4* 4.9 4.4 4.1 3.5 3.4* 3.2*  CL 93* 90* 93* 94* 99 102 102 103  CO2 '29 27 29 28 29 '$ 32 29 29  GLUCOSE 161* 124* 114* 132* 135* 126* 120* 77  BUN 91* 113* 123* 106* 80* 50* 37* 29*  CREATININE 2.66* 2.86* 2.94* 2.37* 1.68* 1.16 0.99 0.94  CALCIUM 8.4* 8.4* 8.1* 8.4* 8.8* 8.8* 8.6* 8.4*  MG 2.5* 2.5* 2.5* 2.6*  --   --   --  2.0  PHOS  --   --   --   --  4.1 2.9 2.5 2.1*    GFR: Estimated Creatinine Clearance: 65.2 mL/min (by C-G formula based on SCr of 0.94 mg/dL). Recent Labs  Lab 09/07/2020 2128 09/21/20 0344 09/22/20 0500 09/24/20 2252 09/25/20 0738 09/26/20 0507 09/27/20 0635  PROCALCITON  --  <0.10  --   --   --   --   --   WBC 3.5* 3.4*   < > 5.5 9.6 6.2 9.3  LATICACIDVEN 0.9 1.2  --   --   --   --   --    < > = values in this interval not displayed.     Liver Function Tests: Recent Labs  Lab 09/22/20 0500 09/23/20 0448 09/24/20 0431 09/25/20 0738 09/25/20 1703 09/26/20 0507 09/26/20 1708 09/27/20 0635  AST 33 '30 30 27  '$ --  29  --   --   ALT '18 19 21 22  '$ --  20  --   --   ALKPHOS 66 62 56 58  --  50  --   --   BILITOT 0.8 1.5* 1.0 1.3*  --  1.0  --   --   PROT 6.6 6.9 6.6 6.7  --  5.8*  --   --   ALBUMIN 3.1* 3.3* 3.2* 3.3* 3.0* 2.8* 2.7* 2.9*    No results for input(s): LIPASE, AMYLASE in the last 168 hours. No results for input(s): AMMONIA in the last 168 hours.  ABG    Component Value Date/Time   PHART 7.42 09/24/2020 1559   PCO2ART 44 09/24/2020 1559   PO2ART 77 (L) 09/24/2020 1559   HCO3 28.5 (H) 09/24/2020 1559   O2SAT 95.5 09/24/2020 1559      Coagulation Profile: Recent Labs  Lab 09/21/2020 2128  INR 1.7*     Cardiac Enzymes: No results for input(s): CKTOTAL, CKMB, CKMBINDEX, TROPONINI in the last 168 hours.  HbA1C: Hgb A1c MFr Bld  Date/Time Value Ref Range Status  09/22/2020 05:00 AM 6.6 (H) 4.8 - 5.6 % Final    Comment:    (NOTE)         Prediabetes: 5.7 - 6.4         Diabetes: >6.4          Glycemic control for adults with diabetes: <7.0   12/02/2017 12:14 PM 8.5 (H) 4.8 - 5.6 % Final    Comment:    (NOTE) Pre diabetes:          5.7%-6.4% Diabetes:              >6.4% Glycemic control for   <7.0% adults with diabetes     CBG: Recent Labs  Lab 09/26/20 1137 09/26/20 1611 09/26/20 2211 09/27/20 0805 09/27/20 0831  GLUCAP 86 122* 110* 60* 71     Review of Systems:   Unable to  assess due to language barrier/CAPR.  Allergies Allergies  Allergen Reactions   Carvedilol Shortness Of Breath    Other reaction(s): Asthma, Shortness of Breath   Penicillins Shortness Of Breath    Other reaction(s): Other (See Comments) Other reaction(s): RASH Other reaction(s): RASH Other reaction(s): RASH    Aspirin     Other reaction(s): Other (See Comments) On eliquis already- high risk of bleeding   Catapres [Clonidine Hcl]     Scheduled Meds:  sodium chloride   Intravenous Once   vitamin C  500 mg Oral Daily   atorvastatin  40 mg Oral q1800   Chlorhexidine Gluconate Cloth  6 each Topical Q0600   cholecalciferol  1,000 Units Oral Daily   donepezil  5 mg Oral QHS   DULoxetine  60 mg Oral Daily   feeding supplement (NEPRO CARB STEADY)  237 mL Oral TID BM   finasteride  5 mg Oral Daily   insulin aspart  0-9 Units Subcutaneous TID WC   insulin glargine-yfgn  24 Units Subcutaneous QHS   Ipratropium-Albuterol  2 puff Inhalation QID   latanoprost  1 drop Both Eyes QHS   levothyroxine  88 mcg Oral Q0600   loratadine  10 mg Oral Daily   [START ON 09/28/2020] midodrine  10 mg Oral TID WC   mometasone-formoterol  2 puff Inhalation BID   multivitamin  1 tablet Oral QHS   omega-3 acid ethyl esters  1 g Oral Daily   pantoprazole  40 mg Oral Daily   sildenafil  20 mg Oral TID   tamsulosin  0.4 mg Oral Daily   tiotropium  18 mcg Inhalation Daily   Vitamin D (Ergocalciferol)  50,000 Units Oral Weekly   zinc sulfate  220 mg Oral Daily   Continuous Infusions:   prismasol  BGK 4/2.5 300 mL/hr at 09/27/20 0911    prismasol BGK 4/2.5 300 mL/hr at 09/27/20 0911   azithromycin Stopped (09/27/20 1119)   dexmedetomidine (PRECEDEX) IV infusion 0.4 mcg/kg/hr (09/27/20 1600)   phenylephrine (NEO-SYNEPHRINE) Adult infusion 95 mcg/min (09/27/20 1609)   prismasol BGK 4/2.5 1,500 mL/hr at 09/27/20 1228   PRN Meds:.acetaminophen **OR** acetaminophen, chlorpheniramine-HYDROcodone, guaiFENesin-dextromethorphan, heparin, levalbuterol, magnesium hydroxide, melatonin, metoprolol tartrate, ondansetron **OR** ondansetron (ZOFRAN) IV, sodium chloride, traZODone   Critical care time: 40 minutes    The patient is critically ill with multiple organ systems failure and requires high complexity decision making for assessment and support, frequent evaluation and titration of therapies, application of advanced monitoring technologies and extensive interpretation of multiple databases. Critical Care Time devoted to patient care services described in this note is  40 minutes.   Renold Don, MD Advanced Bronchoscopy PCCM  [facility]    *This note was dictated using voice recognition software/Dragon.  Despite best efforts to proofread, errors can occur which can change the meaning.  Any change was purely unintentional.

## 2020-09-27 NOTE — Progress Notes (Signed)
Derek Shelton, Derek Shelton 09/27/20  Subjective:   Hospital day # 7  Patient known to our practice from outpatient and is followed patient was last seen in March 2021.  He has medical problems of chronic diastolic CHF, hypertension, insulin-dependent type 2 diabetes, coronary artery disease, dementia, atrial fibrillation, hypothyroidism, osteoarthritis, obstructive sleep apnea.  Baseline creatinine 1.7 with an EGFR of 40.  Patient was admitted on 8/28 for evaluation of shortness of breath and home positive COVID test.  Patient is not able to provide any meaningful information because of illness and dementia.  All information is obtained from the chart as well as from primary team.  -Renal parameters continue to improve with CRRT.  Potassium low at 3.2.  Patient switched over to a 4K bath.  However producing very little urine at the moment.  He is arousable and conversant as interviewer does speak the patient's native language.  Renal: 09/03 0701 - 09/04 0700 In: 1432 [P.O.:115; I.V.:651.2; IV Piggyback:665.8] Out: 564.1  Lab Results  Component Value Date   CREATININE 0.94 09/27/2020   CREATININE 0.99 09/26/2020   CREATININE 1.16 09/26/2020     Objective:  Vital signs in last 24 hours:  Temp:  [95.7 F (35.4 C)-98.2 F (36.8 C)] 96.1 F (35.6 C) (09/04 0800) Pulse Rate:  [42-118] 82 (09/04 1145) Resp:  [11-21] 13 (09/04 0800) BP: (60-123)/(40-84) 91/74 (09/04 1145) SpO2:  [95 %-100 %] 100 % (09/04 1145) Weight:  [84.3 kg] 84.3 kg (09/04 0435)  Weight change: 1.2 kg Filed Weights   09/25/20 0414 09/26/20 0500 09/27/20 0435  Weight: 81.8 kg 83.1 kg 84.3 kg    Intake/Output:    Intake/Output Summary (Last 24 hours) at 09/27/2020 1208 Last data filed at 09/27/2020 1200 Gross per 24 hour  Intake 2497.26 ml  Output 1068.1 ml  Net 1429.16 ml      Physical Exam: General: Critically ill-appearing, laying in the bed  HEENT Moist oral mucous membranes,  anicteric sclera  Pulm/lungs Normal effort, non labored  CVS/Heart Tachycardic  Abdomen:  Soft, nontender  Extremities: No edema  Neurologic: Alert, able to follow few simple commands   Skin: warm, dry  Access: Right femoral temporary dialysis catheter       Basic Metabolic Panel:  Recent Labs  Lab 09/23/20 0448 09/24/20 0431 09/24/20 2252 09/25/20 0738 09/25/20 1703 09/26/20 0507 09/26/20 1708 09/27/20 0635  NA 133* 130* 131* 131* 135 137 135 136  K 4.9 5.4* 4.9 4.4 4.1 3.5 3.4* 3.2*  CL 93* 90* 93* 94* 99 102 102 103  CO2 _0 32 29 29  GLUCOSE 161* 124* 114* 132* 135* 126* 120* 77  BUN 91* 113* 123* 106* 80* 50* 37* 29*  CREATININE 2.66* 2.86* 2.94* 2.37* 1.68* 1.16 0.99 0.94  CALCIUM 8.4* 8.4* 8.1* 8.4* 8.8* 8.8* 8.6* 8.4*  MG 2.5* 2.5* 2.5* 2.6*  --   --   --  2.0  PHOS  --   --   --   --  4.1 2.9 2.5 2.1*      CBC: Recent Labs  Lab 09/23/20 0448 09/24/20 0431 09/24/20 2252 09/25/20 0738 09/26/20 0507 09/27/20 0635  WBC 5.4 6.8 5.5 9.6 6.2 9.3  NEUTROABS 4.8 6.2  --  8.9* 5.4 7.8*  HGB 8.1* 7.8* 6.9* 9.5* 7.8* 9.5*  HCT 24.8* 24.3* 21.8* 30.6* 24.6* 30.3*  MCV 87.9 88.4 92.0 88.7 87.9 91.3  PLT 151 153 125* 168 121* 153  No results found for: HEPBSAG, HEPBSAB, HEPBIGM    Microbiology:  Recent Results (from the past 240 hour(s))  Urine Culture     Status: Abnormal   Collection Time: 09/14/2020  9:28 PM   Specimen: Urine, Random  Result Value Ref Range Status   Specimen Description   Final    URINE, RANDOM Performed at [facility], [address], [place], [place] [address]    Special Requests   Final    NONE Performed at [facility], [address] [place], [place] [address]    Culture (A)  Final    <10,000 COLONIES/mL INSIGNIFICANT GROWTH Performed at [facility], [place] [address], [place], [place] [address]    Report Status 09/23/2020 FINAL  Final  Blood Culture (routine x 2)     Status: None    Collection Time: 09/11/2020  9:28 PM   Specimen: BLOOD  Result Value Ref Range Status   Specimen Description BLOOD BLOOD RIGHT FOREARM  Final   Special Requests   Final    BOTTLES DRAWN AEROBIC AND ANAEROBIC Blood Culture results may not be optimal due to an inadequate volume of blood received in culture bottles   Culture   Final    NO GROWTH 5 DAYS Performed at [facility], [address] [place], [place] [address]    Report Status 09/25/2020 FINAL  Final  Blood Culture (routine x 2)     Status: None   Collection Time: 09/11/2020  9:28 PM   Specimen: BLOOD  Result Value Ref Range Status   Specimen Description BLOOD RIGHT ANTECUBITAL  Final   Special Requests   Final    BOTTLES DRAWN AEROBIC AND ANAEROBIC Blood Culture adequate volume   Culture   Final    NO GROWTH 5 DAYS Performed at [facility], [address] [place], [place] [address]    Report Status 09/25/2020 FINAL  Final  Resp Panel by RT-PCR (Flu A&B, Covid) Nasopharyngeal Swab     Status: Abnormal   Collection Time: 08/29/2020  9:28 PM   Specimen: Nasopharyngeal Swab; Nasopharyngeal(NP) swabs in vial transport medium  Result Value Ref Range Status   SARS Coronavirus 2 by RT PCR POSITIVE (A) NEGATIVE Final    Comment: RESULT CALLED TO, READ BACK BY AND VERIFIED WITH: MELISSA [email or web address]  09/16/2020 RH (NOTE) SARS-CoV-2 target nucleic acids are DETECTED.  The SARS-CoV-2 RNA is generally detectable in upper respiratory specimens during the acute phase of infection. Positive results are indicative of the presence of the identified virus, but do not rule out bacterial infection or co-infection with other pathogens not detected by the test. Clinical correlation with patient history and other diagnostic information is necessary to determine patient infection status. The expected result is Negative.  Fact Sheet for Patients: EntrepreneurPulse.com.au  Fact Sheet for Healthcare  Providers: IncredibleEmployment.be  This test is not yet approved or cleared by the [place] FDA and  has been authorized for detection and/or diagnosis of SARS-CoV-2 by FDA under an Emergency Use Authorization (EUA).  This EUA will remain in effect (meaning this test can be used ) for the duration of  the COVID-19 declaration under Section 564(b)(1) of the Act, 21 U.S.C. section 360bbb-3(b)(1), unless the authorization is terminated or revoked sooner.     Influenza A by PCR NEGATIVE NEGATIVE Final   Influenza B by PCR NEGATIVE NEGATIVE Final    Comment: (NOTE) The Xpert Xpress SARS-CoV-2/FLU/RSV plus assay is intended as an aid in the diagnosis  of influenza from Nasopharyngeal swab specimens and should not be used as a sole basis for treatment. Nasal washings and aspirates are unacceptable for Xpert Xpress SARS-CoV-2/FLU/RSV testing.  Fact Sheet for Patients: EntrepreneurPulse.com.au  Fact Sheet for Healthcare Providers: IncredibleEmployment.be  This test is not yet approved or cleared by the [place] FDA and has been authorized for detection and/or diagnosis of SARS-CoV-2 by FDA under an Emergency Use Authorization (EUA). This EUA will remain in effect (meaning this test can be used) for the duration of the COVID-19 declaration under Section 564(b)(1) of the Act, 21 U.S.C. section 360bbb-3(b)(1), unless the authorization is terminated or revoked.  Performed at [facility], [address] [place], [place] [address]   MRSA Next Gen by PCR, Nasal     Status: None   Collection Time: 09/24/20  5:31 PM   Specimen: Nasal Mucosa; Nasal Swab  Result Value Ref Range Status   MRSA by PCR Next Gen NOT DETECTED NOT DETECTED Final    Comment: (NOTE) The GeneXpert MRSA Assay (FDA approved for NASAL specimens only), is one component of a comprehensive MRSA colonization surveillance program. It is not intended  to diagnose MRSA infection nor to guide or monitor treatment for MRSA infections. Test performance is not FDA approved in patients less than 33 years old. Performed at [facility], [address] [place], [place] [address]     Coagulation Studies: No results for input(s): LABPROT, INR in the last 72 hours.  Urinalysis: No results for input(s): COLORURINE, LABSPEC, PHURINE, GLUCOSEU, HGBUR, BILIRUBINUR, KETONESUR, PROTEINUR, UROBILINOGEN, NITRITE, LEUKOCYTESUR in the last 72 hours.  Invalid input(s): APPERANCEUR    Imaging: No results found.   Medications:     prismasol BGK 4/2.5 300 mL/hr at 09/27/20 0911    prismasol BGK 4/2.5 300 mL/hr at 09/27/20 0911   azithromycin Stopped (09/27/20 1119)   dexmedetomidine (PRECEDEX) IV infusion 0.4 mcg/kg/hr (09/27/20 1200)   phenylephrine (NEO-SYNEPHRINE) Adult infusion 95 mcg/min (09/27/20 1200)   prismasol BGK 4/2.5 1,500 mL/hr at 09/27/20 0907    sodium chloride   Intravenous Once   vitamin C  500 mg Oral Daily   atorvastatin  40 mg Oral q1800   Chlorhexidine Gluconate Cloth  6 each Topical Q0600   cholecalciferol  1,000 Units Oral Daily   donepezil  5 mg Oral QHS   DULoxetine  60 mg Oral Daily   feeding supplement (NEPRO CARB STEADY)  237 mL Oral TID BM   finasteride  5 mg Oral Daily   insulin aspart  0-9 Units Subcutaneous TID WC   insulin glargine-yfgn  24 Units Subcutaneous QHS   Ipratropium-Albuterol  2 puff Inhalation QID   latanoprost  1 drop Both Eyes QHS   levothyroxine  88 mcg Oral Q0600   loratadine  10 mg Oral Daily   midodrine  5 mg Oral TID WC   mometasone-formoterol  2 puff Inhalation BID   multivitamin  1 tablet Oral QHS   omega-3 acid ethyl esters  1 g Oral Daily   pantoprazole  40 mg Oral Daily   sildenafil  20 mg Oral TID   tamsulosin  0.4 mg Oral Daily   tiotropium  18 mcg Inhalation Daily   Vitamin D (Ergocalciferol)  50,000 Units Oral Weekly   zinc sulfate  220 mg Oral Daily    acetaminophen **OR** acetaminophen, chlorpheniramine-HYDROcodone, guaiFENesin-dextromethorphan, heparin, levalbuterol, magnesium hydroxide, melatonin, metoprolol tartrate, ondansetron **OR** ondansetron (ZOFRAN) IV, sodium chloride, traZODone  Assessment/ Plan:  80 y.o. male with  medical problems of chronic diastolic CHF, hypertension, insulin-dependent type 2 diabetes, coronary artery disease, dementia, atrial fibrillation, hypothyroidism, osteoarthritis, obstructive sleep apnea  admitted on 08/26/2020 for SOB (shortness of breath) [R06.02] COPD exacerbation (HCC) [J44.1] Acute CHF (congestive heart failure) (HCC) [I50.9] Acute on chronic diastolic congestive heart failure ([place]) [I50.33] Atrial fibrillation with RVR ([place]) [I48.91] COVID-19 [U07.1]  2D echo: June 29, 2020: LVEF greater than 55%, mild LVH, diastolic function indeterminate, moderately elevated pulmonary artery systolic pressure, estimated 45 mm, moderate to severe tricuspid regurgitation  #Acute kidney injury, anuric. #Hyperkalemia- Resolved. #Chronic kidney disease stage IIIb baseline EGFR 40 AKI likely secondary to ATN from concurrent illness, hypotension -CRRT started overnight to address azotemia, uremia, hyperkalemia and for metabolic optimization -Renal parameters have continued to improve.  BUN down to 29 with a creatinine of 0.94.  Therefore azotemia should not be playing a role in his underlying mental status now.  He does have baseline dementia.  Continue CRRT at this time and we will keep him net even.  Potassium actually became low today at 3.2.  Therefore patient switched over to a 4K bath.  #Acute on chronic hypoxic respiratory failure in setting of COPD, COVID-19 infection -Continues to do well with just nasal cannula at the moment.  #Pulmonary hypertension, severe tricuspid regurgitation -Currently keeping net volume balance even with CRRT  #Anemia of CKD.  Hemoglobin up to 9.5.  Continue to monitor CBC  closely.  Hold off on Epogen for now.   LOS: 7 Roshan Salamon 9/4/202212:08 PM  [place], [place]  Note: This note was prepared with Dragon dictation. Any transcription errors are unintentional

## 2020-09-27 NOTE — Progress Notes (Signed)
Nephrology [place] called with low potassium results. Verbal order to change dialysis fluids to 4k bath. Pt hypoglycemic this morning. Gave pt his milk and juice. Fed meal tray. CBG re-checked. CBG 94. MD made aware MAP [id number] but systolic less than 90. Verbal order for 500 cc bolus independent of CRRT. Bladder scanned and only 239 cc found. No urine output overnight and so far this shift. Assessment limited due to confusion, Interpretor used for assessment but pt not interacting with RN and interpretor. Needs anticipated. Will continue to monitor.

## 2020-09-28 LAB — CBC WITH DIFFERENTIAL/PLATELET
Abs Immature Granulocytes: 0.03 10*3/uL (ref 0.00–0.07)
Basophils Absolute: 0 10*3/uL (ref 0.0–0.1)
Basophils Relative: 0 %
Eosinophils Absolute: 0.1 10*3/uL (ref 0.0–0.5)
Eosinophils Relative: 1 %
HCT: 25.5 % — ABNORMAL LOW (ref 39.0–52.0)
Hemoglobin: 7.8 g/dL — ABNORMAL LOW (ref 13.0–17.0)
Immature Granulocytes: 0 %
Lymphocytes Relative: 5 %
Lymphs Abs: 0.4 10*3/uL — ABNORMAL LOW (ref 0.7–4.0)
MCH: 27.4 pg (ref 26.0–34.0)
MCHC: 30.6 g/dL (ref 30.0–36.0)
MCV: 89.5 fL (ref 80.0–100.0)
Monocytes Absolute: 0.6 10*3/uL (ref 0.1–1.0)
Monocytes Relative: 8 %
Neutro Abs: 6.7 10*3/uL (ref 1.7–7.7)
Neutrophils Relative %: 86 %
Platelets: 97 10*3/uL — ABNORMAL LOW (ref 150–400)
RBC: 2.85 MIL/uL — ABNORMAL LOW (ref 4.22–5.81)
RDW: 16.7 % — ABNORMAL HIGH (ref 11.5–15.5)
WBC: 7.9 10*3/uL (ref 4.0–10.5)
nRBC: 0.3 % — ABNORMAL HIGH (ref 0.0–0.2)

## 2020-09-28 LAB — GLUCOSE, CAPILLARY
Glucose-Capillary: 87 mg/dL (ref 70–99)
Glucose-Capillary: 127 mg/dL — ABNORMAL HIGH (ref 70–99)
Glucose-Capillary: 99 mg/dL (ref 70–99)
Glucose-Capillary: 109 mg/dL — ABNORMAL HIGH (ref 70–99)

## 2020-09-28 LAB — RENAL FUNCTION PANEL
Albumin: 2.7 g/dL — ABNORMAL LOW (ref 3.5–5.0)
Anion gap: 0 — ABNORMAL LOW (ref 5–15)
BUN: 16 mg/dL (ref 8–23)
CO2: 30 mmol/L (ref 22–32)
Calcium: 7.6 mg/dL — ABNORMAL LOW (ref 8.9–10.3)
Chloride: 106 mmol/L (ref 98–111)
Creatinine, Ser: 0.83 mg/dL (ref 0.61–1.24)
GFR, Estimated: >60 mL/min (ref >60)
Glucose, Bld: 104 mg/dL — ABNORMAL HIGH (ref 70–99)
Phosphorus: 1.4 mg/dL — ABNORMAL LOW (ref 2.5–4.6)
Potassium: 3.8 mmol/L (ref 3.5–5.1)
Sodium: 136 mmol/L (ref 135–145)

## 2020-09-28 LAB — MAGNESIUM: Magnesium: 2 mg/dL (ref 1.7–2.4)

## 2020-09-28 MED ORDER — VASOPRESSIN 20 UNITS/100 ML INFUSION FOR SHOCK
0.0000 [IU]/min | INTRAVENOUS | Status: DC
  Administered 2020-09-28 – 2020-09-29 (×2): 0.04 [IU]/min via INTRAVENOUS
  Filled 2020-09-28 (×3): qty 100

## 2020-09-28 MED ORDER — DILTIAZEM HCL ER COATED BEADS 120 MG PO CP24
120.0000 mg | ORAL_CAPSULE | Freq: Every day | ORAL | Status: DC
  Filled 2020-09-28 (×2): qty 1

## 2020-09-28 MED ORDER — METOPROLOL TARTRATE 5 MG/5ML IV SOLN
2.5000 mg | INTRAVENOUS | Status: AC
Start: 2020-09-29 — End: 2020-09-28
  Administered 2020-09-28: 2.5 mg via INTRAVENOUS

## 2020-09-28 MED ORDER — PHENYLEPHRINE HCL-NACL 20-0.9 MG/250ML-% IV SOLN
0.0000 ug/min | INTRAVENOUS | Status: DC
  Administered 2020-09-28 (×2): 75 ug/min via INTRAVENOUS
  Administered 2020-09-28: 95 ug/min via INTRAVENOUS
  Administered 2020-09-28: 70 ug/min via INTRAVENOUS
  Administered 2020-09-28: 75 ug/min via INTRAVENOUS
  Administered 2020-09-29: 50 ug/min via INTRAVENOUS
  Administered 2020-09-29: 60 ug/min via INTRAVENOUS
  Filled 2020-09-28 (×7): qty 250

## 2020-09-28 NOTE — Progress Notes (Signed)
[place], [place] 09/28/20  Subjective:   Hospital day # 8  Patient known to our practice from outpatient and is followed patient was last seen in March 2021.  He has medical problems of chronic diastolic CHF, hypertension, insulin-dependent type 2 diabetes, coronary artery disease, dementia, atrial fibrillation, hypothyroidism, osteoarthritis, obstructive sleep apnea.  Baseline creatinine 1.7 with an EGFR of 40.  Patient was admitted on 8/28 for evaluation of shortness of breath and home positive COVID test.  Patient is not able to provide any meaningful information because of illness and dementia.  All information is obtained from the chart as well as from primary team.  -Patient continues to tolerate CRRT well.  Case discussed with nursing this AM.  Patient has not been eating very much.  Hemoglobin has drifted down to 7.8.  Renal: 09/04 0701 - 09/05 0700 In: 3717.1 [P.O.:720; I.V.:1778; IV Piggyback:1219.1] Out: 2928 [Urine:250] Lab Results  Component Value Date   CREATININE 0.79 09/27/2020   CREATININE 0.94 09/27/2020   CREATININE 0.99 09/26/2020     Objective:  Vital signs in last 24 hours:  Temp:  [95.9 F (35.5 C)-98.1 F (36.7 C)] 96.4 F (35.8 C) (09/05 1200) Pulse Rate:  [27-148] 105 (09/05 1200) Resp:  [0-19] 0 (09/05 1200) BP: (66-139)/(50-110) 75/58 (09/05 1200) SpO2:  [88 %-100 %] 99 % (09/05 1200)  Weight change:  Filed Weights   09/25/20 0414 09/26/20 0500 09/27/20 0435  Weight: 81.8 kg 83.1 kg 84.3 kg    Intake/Output:    Intake/Output Summary (Last 24 hours) at 09/28/2020 1303 Last data filed at 09/28/2020 1200 Gross per 24 hour  Intake 2507.76 ml  Output 2438.57 ml  Net 69.19 ml      Physical Exam: General: Critically ill-appearing, laying in the bed  HEENT Moist oral mucous membranes, anicteric sclera  Pulm/lungs Normal effort, non labored  CVS/Heart Tachycardic  Abdomen:  Soft, nontender  Extremities: No  edema  Neurologic: Alert, able to follow few simple commands   Skin: warm, dry  Access: Right femoral temporary dialysis catheter       Basic Metabolic Panel:  Recent Labs  Lab 09/24/20 0431 09/24/20 2252 09/25/20 0738 09/25/20 1703 09/26/20 0507 09/26/20 1708 09/27/20 0635 09/27/20 1545 09/28/20 0511  NA 130* 131* 131* 135 137 135 136 135  --   K 5.4* 4.9 4.4 4.1 3.5 3.4* 3.2* 3.4*  --   CL 90* 93* 94* 99 102 102 103 104  --   CO2 $Re'27 29 28 29 'klX$ 32 $R'29 29 28  'bg$ --   GLUCOSE 124* 114* 132* 135* 126* 120* 77 114*  --   BUN 113* 123* 106* 80* 50* 37* 29* 24*  --   CREATININE 2.86* 2.94* 2.37* 1.68* 1.16 0.99 0.94 0.79  --   CALCIUM 8.4* 8.1* 8.4* 8.8* 8.8* 8.6* 8.4* 8.1*  --   MG 2.5* 2.5* 2.6*  --   --   --  2.0  --  2.0  PHOS  --   --   --  4.1 2.9 2.5 2.1* 1.9*  --       CBC: Recent Labs  Lab 09/24/20 0431 09/24/20 2252 09/25/20 0738 09/26/20 0507 09/27/20 0635 09/28/20 0511  WBC 6.8 5.5 9.6 6.2 9.3 7.9  NEUTROABS 6.2  --  8.9* 5.4 7.8* 6.7  HGB 7.8* 6.9* 9.5* 7.8* 9.5* 7.8*  HCT 24.3* 21.8* 30.6* 24.6* 30.3* 25.5*  MCV 88.4 92.0 88.7 87.9 91.3 89.5  PLT 153 125* 168 121* 153  97*      No results found for: HEPBSAG, HEPBSAB, HEPBIGM    Microbiology:  Recent Results (from the past 240 hour(s))  Urine Culture     Status: Abnormal   Collection Time: 09/12/2020  9:28 PM   Specimen: Urine, Random  Result Value Ref Range Status   Specimen Description   Final    URINE, RANDOM Performed at [facility], [address], [place], [place] [address]    Special Requests   Final    NONE Performed at [facility], [address], [place], [place] [address]    Culture (A)  Final    <10,000 COLONIES/mL INSIGNIFICANT GROWTH Performed at [facility], [place] [address], [place], [place] [address]    Report Status 09/23/2020 FINAL  Final  Blood Culture (routine x 2)     Status: None   Collection Time: 09/10/2020  9:28 PM   Specimen: BLOOD   Result Value Ref Range Status   Specimen Description BLOOD BLOOD RIGHT FOREARM  Final   Special Requests   Final    BOTTLES DRAWN AEROBIC AND ANAEROBIC Blood Culture results may not be optimal due to an inadequate volume of blood received in culture bottles   Culture   Final    NO GROWTH 5 DAYS Performed at [facility], [address] [place], [place] [address]    Report Status 09/25/2020 FINAL  Final  Blood Culture (routine x 2)     Status: None   Collection Time: 09/17/2020  9:28 PM   Specimen: BLOOD  Result Value Ref Range Status   Specimen Description BLOOD RIGHT ANTECUBITAL  Final   Special Requests   Final    BOTTLES DRAWN AEROBIC AND ANAEROBIC Blood Culture adequate volume   Culture   Final    NO GROWTH 5 DAYS Performed at [facility], [address] [place], [place] [address]    Report Status 09/25/2020 FINAL  Final  Resp Panel by RT-PCR (Flu A&B, Covid) Nasopharyngeal Swab     Status: Abnormal   Collection Time: 09/19/2020  9:28 PM   Specimen: Nasopharyngeal Swab; Nasopharyngeal(NP) swabs in vial transport medium  Result Value Ref Range Status   SARS Coronavirus 2 by RT PCR POSITIVE (A) NEGATIVE Final    Comment: RESULT CALLED TO, READ BACK BY AND VERIFIED WITH: MELISSA HOUP$RemoveBeforeDEI'@[email or web address]'[id number]$  08/24/2020 RH (NOTE) SARS-CoV-2 target nucleic acids are DETECTED.  The SARS-CoV-2 RNA is generally detectable in upper respiratory specimens during the acute phase of infection. Positive results are indicative of the presence of the identified virus, but do not rule out bacterial infection or co-infection with other pathogens not detected by the test. Clinical correlation with patient history and other diagnostic information is necessary to determine patient infection status. The expected result is Negative.  Fact Sheet for Patients: EntrepreneurPulse.com.au  Fact Sheet for Healthcare Providers: IncredibleEmployment.be  This test is  not yet approved or cleared by the [place] FDA and  has been authorized for detection and/or diagnosis of SARS-CoV-2 by FDA under an Emergency Use Authorization (EUA).  This EUA will remain in effect (meaning this test can be used ) for the duration of  the COVID-19 declaration under Section 564(b)(1) of the Act, 21 U.S.C. section 360bbb-3(b)(1), unless the authorization is terminated or revoked sooner.     Influenza A by PCR NEGATIVE NEGATIVE Final   Influenza B by PCR NEGATIVE NEGATIVE Final    Comment: (NOTE) The Xpert Xpress SARS-CoV-2/FLU/RSV plus assay is intended  as an aid in the diagnosis of influenza from Nasopharyngeal swab specimens and should not be used as a sole basis for treatment. Nasal washings and aspirates are unacceptable for Xpert Xpress SARS-CoV-2/FLU/RSV testing.  Fact Sheet for Patients: EntrepreneurPulse.com.au  Fact Sheet for Healthcare Providers: IncredibleEmployment.be  This test is not yet approved or cleared by the [place] FDA and has been authorized for detection and/or diagnosis of SARS-CoV-2 by FDA under an Emergency Use Authorization (EUA). This EUA will remain in effect (meaning this test can be used) for the duration of the COVID-19 declaration under Section 564(b)(1) of the Act, 21 U.S.C. section 360bbb-3(b)(1), unless the authorization is terminated or revoked.  Performed at [facility], [address] [place], [place] [address]   MRSA Next Gen by PCR, Nasal     Status: None   Collection Time: 09/24/20  5:31 PM   Specimen: Nasal Mucosa; Nasal Swab  Result Value Ref Range Status   MRSA by PCR Next Gen NOT DETECTED NOT DETECTED Final    Comment: (NOTE) The GeneXpert MRSA Assay (FDA approved for NASAL specimens only), is one component of a comprehensive MRSA colonization surveillance program. It is not intended to diagnose MRSA infection nor to guide or monitor treatment for MRSA  infections. Test performance is not FDA approved in patients less than 78 years old. Performed at [facility], [address] [place], [place] [address]     Coagulation Studies: No results for input(s): LABPROT, INR in the last 72 hours.  Urinalysis: No results for input(s): COLORURINE, LABSPEC, PHURINE, GLUCOSEU, HGBUR, BILIRUBINUR, KETONESUR, PROTEINUR, UROBILINOGEN, NITRITE, LEUKOCYTESUR in the last 72 hours.  Invalid input(s): APPERANCEUR    Imaging: No results found.   Medications:     prismasol BGK 4/2.5 300 mL/hr at 09/28/20 0222    prismasol BGK 4/2.5 300 mL/hr at 09/28/20 0957   dexmedetomidine (PRECEDEX) IV infusion 0.4 mcg/kg/hr (09/28/20 1200)   phenylephrine (NEO-SYNEPHRINE) Adult infusion 65 mcg/min (09/28/20 1200)   prismasol BGK 4/2.5 1,500 mL/hr at 09/28/20 3810    sodium chloride   Intravenous Once   vitamin C  500 mg Oral Daily   atorvastatin  40 mg Oral q1800   Chlorhexidine Gluconate Cloth  6 each Topical Q0600   cholecalciferol  1,000 Units Oral Daily   donepezil  5 mg Oral QHS   DULoxetine  60 mg Oral Daily   feeding supplement (NEPRO CARB STEADY)  237 mL Oral TID BM   finasteride  5 mg Oral Daily   insulin aspart  0-9 Units Subcutaneous TID WC   insulin glargine-yfgn  24 Units Subcutaneous QHS   Ipratropium-Albuterol  2 puff Inhalation QID   latanoprost  1 drop Both Eyes QHS   levothyroxine  88 mcg Oral Q0600   loratadine  10 mg Oral Daily   midodrine  10 mg Oral TID WC   mometasone-formoterol  2 puff Inhalation BID   multivitamin  1 tablet Oral QHS   omega-3 acid ethyl esters  1 g Oral Daily   pantoprazole  40 mg Oral Daily   sildenafil  20 mg Oral TID   tamsulosin  0.4 mg Oral Daily   tiotropium  18 mcg Inhalation Daily   Vitamin D (Ergocalciferol)  50,000 Units Oral Weekly   zinc sulfate  220 mg Oral Daily   acetaminophen **OR** acetaminophen, chlorpheniramine-HYDROcodone, guaiFENesin-dextromethorphan, heparin, levalbuterol,  magnesium hydroxide, melatonin, metoprolol tartrate, ondansetron **OR** ondansetron (ZOFRAN) IV, sodium chloride, traZODone  Assessment/ Plan:  80 y.o. male with  medical problems of chronic diastolic CHF, hypertension, insulin-dependent type 2 diabetes, coronary artery disease, dementia, atrial fibrillation, hypothyroidism, osteoarthritis, obstructive sleep apnea  admitted on 09/19/2020 for SOB (shortness of breath) [R06.02] COPD exacerbation (HCC) [J44.1] Acute CHF (congestive heart failure) (HCC) [I50.9] Acute on chronic diastolic congestive heart failure ([place]) [I50.33] Atrial fibrillation with RVR ([place]) [I48.91] COVID-19 [U07.1]  2D echo: June 29, 2020: LVEF greater than 55%, mild LVH, diastolic function indeterminate, moderately elevated pulmonary artery systolic pressure, estimated 45 mm, moderate to severe tricuspid regurgitation  #Acute kidney injury, anuric. #Hyperkalemia- Resolved. #Chronic kidney disease stage IIIb baseline EGFR 40 AKI likely secondary to ATN from concurrent illness, hypotension -CRRT started overnight to address azotemia, uremia, hyperkalemia and for metabolic optimization -BUN currently 24 with a creatinine of 0.7.  Continue to monitor renal parameters closely.  We will maintain the patient on CRRT for now keeping net even fluid balance.  #Acute on chronic hypoxic respiratory failure in setting of COPD, COVID-19 infection -Patient was weaned off of nasal cannula earlier this AM.  Respiratory status does appear to be stable.  #Pulmonary hypertension, severe tricuspid regurgitation -Currently keeping net volume balance even with CRRT  #Anemia of CKD.  Hemoglobin is dropped a bit to 7.8.  Further evaluation and work-up per hospitalist and critical care.  Defer decisions regarding blood transfusion to them.   LOS: 8 Renata Gambino 9/5/20221:03 PM  [place], [place]  Note: This note was prepared with Dragon  dictation. Any transcription errors are unintentional

## 2020-09-28 NOTE — Progress Notes (Signed)
PRN 2.5 mg Metoprolol given for HR>115, will continue to monitor and assess.

## 2020-09-28 NOTE — Progress Notes (Signed)
Second attempt to communicate with patient  via interpreter [phone]. Patient would not speak with interpreter. He did state his name for her and also shouted "COLD", warm blankets provided to patient. The patient responded "yes" when asked if he was short of breat, O2 sats 100% on 2LNC, RR 14.Limited assessment due inability to communicate with patient. Patients appears to be more comfortable after given warm blankets.

## 2020-09-28 NOTE — Plan of Care (Signed)

## 2020-09-28 NOTE — TOC Initial Note (Signed)
Transition of Care [facility]) - Initial/Assessment Note    Patient Details  Name: Derek Shelton MRN: [id number] Date of Birth: 07/23/1940  Transition of Care [facility]) CM/SW Contact:    Anselm Pancoast, RN Phone Number: 09/28/2020, 12:42 PM  Clinical Narrative:                 Spoke with son, Inda Merlin who reports patient lives home with his spouse. Has 5 sons who live locally and are available to assist. One son is a MD and another is a Marine scientist. Patient uses walker at home and requires some assistance with ADL's. Has history of dementia. Son is concerned about transportation to and from dialysis at discharge as patient requires wheelchair to leave the house and sons are not able to always help. Spouse does not drive. Confirmed patient has Medicaid and should qualify for assistance with transportation. Family is open to possible short term rehab placement however wants to wait and see how much patient recovers once closer to discharge.   Unable to speak with patient as patient refusing to work with interpreter.   Expected Discharge Plan: Skilled Nursing Facility Barriers to Discharge: Continued Medical Work up   Patient Goals and CMS Choice Patient states their goals for this hospitalization and ongoing recovery are:: Get back home      Expected Discharge Plan and Services Expected Discharge Plan: [place]       Living arrangements for the past 2 months: Single Family Home                                      Prior Living Arrangements/Services Living arrangements for the past 2 months: Single Family Home Lives with:: Spouse Patient language and need for interpreter reviewed:: Yes Do you feel safe going back to the place where you live?: Yes      Need for Family Participation in Patient Care: Yes (Comment) Care giver support system in place?: Yes (comment) Current home services: DME (walker, BSC) Criminal Activity/Legal Involvement Pertinent to Current  Situation/Hospitalization: No - Comment as needed  Activities of Daily Living Home Assistive Devices/Equipment: Walker (specify type) ADL Screening (condition at time of admission) Patient's cognitive ability adequate to safely complete daily activities?: No Is the patient deaf or have difficulty hearing?: No Does the patient have difficulty seeing, even when wearing glasses/contacts?: No Does the patient have difficulty concentrating, remembering, or making decisions?: Yes Patient able to express need for assistance with ADLs?: Yes Does the patient have difficulty dressing or bathing?: Yes Independently performs ADLs?: No Communication: Independent Dressing (OT): Appropriate for developmental age Grooming: Appropriate for developmental age Feeding: Appropriate for developmental age Bathing: Appropriate for developmental age 62: Needs assistance Is this a change from baseline?: Pre-admission baseline In/Out Bed: Needs assistance Is this a change from baseline?: Pre-admission baseline Walks in Home: Independent with device (comment) Does the patient have difficulty walking or climbing stairs?: Yes Weakness of Legs: Both Weakness of Arms/Hands: Both  Permission Sought/Granted                  Emotional Assessment   Attitude/Demeanor/Rapport: Unable to Assess Affect (typically observed): Unable to Assess   Alcohol / Substance Use: Not Applicable Psych Involvement: No (comment)  Admission diagnosis:  SOB (shortness of breath) [R06.02] COPD exacerbation (HCC) [J44.1] Acute CHF (congestive heart failure) (HCC) [I50.9] Acute on chronic diastolic congestive heart failure (HCC) [I50.33] Atrial  fibrillation with RVR ([place]) [I48.91] COVID-19 [U07.1] Patient Active Problem List   Diagnosis Date Noted   Chronic respiratory failure with hypoxia ([place]) 09/21/2020   Acute on chronic anemia 07/15/2020   Acute hyponatremia 07/15/2020   COVID-19 virus infection 01/14/2020    COPD (chronic obstructive pulmonary disease) (HCC)    Hypothyroidism    Acute respiratory failure with hypoxia ([place]) 08/06/2019   Acute on chronic diastolic CHF (congestive heart failure) ([place]) 08/06/2019   History of GI bleed 08/06/2019   History of bradycardia 08/06/2019   Moderate tricuspid regurgitation 08/06/2019   PAH (pulmonary artery hypertension) ([place]) 08/06/2019   History of CVA (cerebrovascular accident) 08/06/2019   LLL pneumonia 08/06/2019   HCAP (healthcare-associated pneumonia) 08/06/2019   Acute CHF (congestive heart failure) ([place]) 08/06/2019   Dementia without behavioral disturbance ([place])    Normocytic anemia 07/06/2019   Hypokalemia 04/02/2019   Proteinuria 03/04/2019   Retention of urine 03/04/2019   Chronic kidney disease, stage 3a ([place]) 03/04/2019   Type II diabetes mellitus with renal manifestations ([place]) 03/04/2019   Chronic kidney disease, stage 2 (mild) 01/04/2019   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   BPH (benign prostatic hyperplasia) 07/18/2018   Degeneration of lumbar intervertebral disc 07/17/2018   Osteoarthritis of knee 07/17/2018   Spinal stenosis of lumbar region 07/17/2018   Acute CVA (cerebrovascular accident) ([place]) 12/02/2017   Acute ischemic stroke ([place]) [id number]   Acute metabolic encephalopathy due to hypoglycemia 11/29/2017   Essential hypertension 11/29/2017   Syncope and collapse 11/28/2017   Atrial fibrillation, chronic ([place]) 05/22/2017   Chest pain 02/21/2017   Encounter for continuous renal replacement therapy (CRRT) for acute renal failure ([place]) 02/21/2017   SOB (shortness of breath) 02/08/2017   Atrial fibrillation with RVR ([place]) 02/08/2017   Hyperlipidemia 09/23/2014   GI bleeding 08/06/2014   Anemia 08/06/2014   Bradycardia 08/06/2014   Atrial fibrillation ([place]) 08/06/2014   Hyponatremia 08/06/2014   Chronic diastolic heart failure ([place]) 08/06/2014   OSA (obstructive sleep apnea) 08/06/2014   Memory loss  or impairment 05/29/2014   Current smoker 09/19/2013   Asthma 11/29/2012   Dizziness 11/15/2012   Other dyspnea and respiratory abnormality 11/15/2012   Pain in joint involving lower leg 01/05/2011   Type 2 diabetes mellitus without complication ([place]) 0000000   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   Chronic rhinitis 08/11/2010   Coronary artery disease 08/11/2010   Esophageal reflux 08/11/2010   Personal history of arthritis 08/11/2010   Tear film insufficiency 06/07/2010   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   Keratoconjunctivitis sicca ([place]) 03/10/2010   Lens replaced 03/10/2010   Pterygium 03/10/2010   Gastro-esophageal reflux disease with esophagitis 01/30/2006   PCP:  Gladstone Lighter, MD Pharmacy:   [id number]: [phone]: [phone]    Social Determinants of Health (SDOH) Interventions    Readmission Risk Interventions Readmission Risk Prevention Plan 01/15/2020  Transportation Screening Complete  Medication Review ([place] Complete  PCP or Specialist appointment within 3-5 days of discharge Complete  HRI or [place]  SW Recovery Care/Counseling Consult Complete  Palliative Care Screening Not [place] Applic[place]  Some recent data might be hidden

## 2020-09-28 NOTE — Progress Notes (Signed)
NAME:  Derek Shelton, MRN:  [id number], DOB:  1940-11-06, LOS: 8 ADMISSION DATE:  09/09/2020, CONSULTATION DATE:  09/24/2020 REFERRING MD:  Dr. Loleta Books, CHIEF COMPLAINT:  Altered mental status, Acute on Chronic Hypoxic Respiratory Failure   Brief Pt Description / Synopsis:  80 year old male with a past medical history significant for COPD with FEV1 54% requiring 2 L home O2 at baseline, HFpEF, obesity, diabetes mellitus, coronary artery disease, hypertension, dementia and hypothyroidism admitted with acute on chronic hypoxic respiratory failure in the setting of acute COPD exacerbation and COVID-19 infection.  Course complicated by Acute Metabolic Encephalopathy in setting of AKI and Uremia requiring Dialysis.  09/28/20- patient is on room air, he is still on CRRT.  Platelets are low today, I suspect this may be due to CRRT circuit however will test for HITs.  He is tachycardic likely compensatory due to acutely ill state.   History of Present Illness:  Derek Shelton is a 80 year old male (Pakistani speaking only) with a past medical history significant for COPD with FEV1 54% requiring 2 L supplemental O2 at home baseline, HFpEF, coronary artery disease, diabetes mellitus type 2, hypertension, dyslipidemia, and hypothyroidism who presented to [facility] ED on 09/24/2020 shortness of breath, productive cough, and wheezing.  He also endorsed nausea and poor appetite.  He denied vomiting, diarrhea, chest pain, palpitations, loss of taste or smell, dysuria.  Of note he did test positive for COVID with a home antigen test.  ED course: Vital signs: Pulse 120, respiratory rate 28, SPO2 88% on room air Labs: BUN 69, creatinine 1.75, BNP 288.7 COVID-19 PCR positive EKG with atrial fib with RVR and right bundle branch block Chest x-ray with cardiomegaly and central vascular congestion  He was admitted by the hospitalist for further work-up and treatment of acute on chronic hypoxic respiratory failure in the  setting of acute COPD repeat x-ray showed COVID-19 infection, and acute decompensated HFpEF.  Cardiology was consulted.  Hospital course: On 09/24/2020, creatinine worsened to which diuretics were held and was given IV fluids..  He was also noted to be more somnolent/lethargic with soft blood pressures.  He is being transferred to the stepdown unit for further work-up and treatment.  PCCM is consulted for further assistance with management of Acute Metabolic Encephalopathy, suspect secondary to Uremia.  Discussed with Nephrology, will plan for placement of temporary dialysis catheter placement and Dialysis.    Pertinent  Medical History  COPD on 2 L supplemental oxygen at baseline HFpEF Coronary artery disease Hypertension Hyperlipidemia Obesity Sleep apnea Dementia GERD GI bleed Hypothyroidism  Micro Data:  09/13/2020: SARS-CoV-2 PCR>> positive 08/24/2020: Influenza PCR>>negative 08/26/2020: Blood culture x2>>negative  09/23/2020: Urine culture>><10,000 colonies /mL insignificant growth   Antimicrobials:  Ceftriaxone 8/29 x1 dose Azithromycin 8/31>> Remdesivir 8/29>>9/1  Significant Hospital Events: Including procedures, antibiotic start and stop dates in addition to other pertinent events   09/24/20: Creatinine worsening, diuretics held. Pt with lethargy, transfer to ICU.  High risk for intubation.  Nephrology consulted for severe uremia.  Plan to place temporary dialysis catheter in anticipation of need for dialysis. 09/25/20: CRRT continued  09/26/20: Continues on CRRT 09/27/20: Continues on CRRT  Interim History / Subjective:  Pt now able to follow commands and tolerate po diet; he requires pressors intermittently, however, he continues to have rate control issues associated with chronic atrial fibrillation prn iv metoprolol ordered.  Per Dr. Saralyn Pilar patient's baseline rate is approximately 100-120.  Some agitation/sundowning last night, does have underlying history of dementia.  Several episodes of hypotension today that responded to volume challenge.  Objective   BP (!) 84/50   Pulse (!) 57   Temp (!) 97.3 F (36.3 C)   Resp (!) 0   Ht '5\' 8"'$  (1.727 m)   Wt 84.3 kg   SpO2 100%   BMI 28.26 kg/m          Intake/Output Summary (Last 24 hours) at 09/28/2020 [id number] Last data filed at 09/28/2020 0700 Gross per 24 hour  Intake 3717.07 ml  Output 2928 ml  Net 789.07 ml    Filed Weights   09/25/20 0414 09/26/20 0500 09/27/20 0435  Weight: 81.8 kg 83.1 kg 84.3 kg    Examination: Physical examination is limited due to need for PPE/CAPR General: Chronically ill-appearing male, laying in bed, awake and alert, in no acute distress.  On CRRT.  HENT: Atraumatic, normocephalic, neck supple Lungs: Diminished breath sounds bilaterally with coarse breath sounds in bilateral upper fields, even, nonlabored Cardiovascular: Controlled ventricular response, irregular irregular rhythm (A. fib on telemetry), Abdomen: Obese, soft, nontender, nondistended, no guarding rebound tenderness, bowel sounds positive x4 Extremities: Normal bulk and tone, no deformities Neuro: Awake and alert, watching TV, no distress GU: External catheter in place  Resolved Hospital Problem list     Assessment & Plan:   Acute on Chronic Hypoxic Respiratory Failure in the setting of AECOPD, COVID-19 Infection, and suspected Acute Decompensated HFpEF PMHx of COPD on 2L supplemental O2 at baseline -Supplemental O2 as needed to maintain O2 sats >88% -Currently back to baseline O2 requirement -Follow intermittent Chest X-ray and ABG as needed -On CRRT -Remdesivir, completed 5 days -IV steroids discontinued~09/2  -Continue scheduled and PRN bronchodilators via MDI -Completed Azithromycin -Vitamin C, zinc  Acute on Chronic HFpEF Paroxsymal Atrial fibrillation A. 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 1.5) Hyperkalemia-resolved  Hyponatremia -Monitor I&O's / urinary output -Follow BMP -Ensure adequate renal perfusion -Avoid nephrotoxic agents as able -Replace electrolytes as indicated -Nephrology initiated ~CRRT 09/01, continues today -Question DC CRRT  Acute Metabolic Encephalopathy, suspect secondary to Uremia also complicated by dementia~improving  -Provide supportive care -Avoid sedating meds as able -Precedex if needed -Frequent reorientation, challenging due to language barrier  Acute on chronic anemia~black tarry stools during hospitalization  -Monitor for S/Sx of bleeding -Trend CBC -Hold outpatient eliquis  -Transfuse for Hgb <7  Diabetes Mellitus -CBG's q4h; Target range of 140 to 180 -SSI and scheduled semglee  -Follow ICU Hypo/Hyperglycemia protocol  Best Practice (right click and "Reselect all SmartList Selections" daily)   Diet/type: Carb modified  DVT prophylaxis: SCD's  GI prophylaxis: PPI Lines: Right femoral trialysis catheter  Foley:  N/A Code Status:  full code Last date of multidisciplinary goals of care discussion [09/25/2020]  Labs   CBC: Recent Labs  Lab 09/24/20 0431 09/24/20 2252 09/25/20 0738 09/26/20 0507 09/27/20 0635 09/28/20 0511  WBC 6.8 5.5 9.6 6.2 9.3 7.9  NEUTROABS 6.2  --  8.9* 5.4 7.8* 6.7  HGB 7.8* 6.9* 9.5* 7.8*  9.5* 7.8*  HCT 24.3* 21.8* 30.6* 24.6* 30.3* 25.5*  MCV 88.4 92.0 88.7 87.9  91.3 89.5  PLT 153 125* 168 121* 153 97*     Basic Metabolic Panel: Recent Labs  Lab 09/24/20 0431 09/24/20 2252 09/25/20 0738 09/25/20 1703 09/26/20 0507 09/26/20 1708 09/27/20 0635 09/27/20 1545 09/28/20 0511  NA 130* 131* 131* 135 137 135 136 135  --   K 5.4* 4.9 4.4 4.1 3.5 3.4* 3.2* 3.4*  --   CL 90* 93* 94* 99 102 102 103 104  --   CO2 '27 29 28 29 '$ 32 '29 29 28  '$ --   GLUCOSE 124* 114* 132* 135* 126* 120* 77 114*  --   BUN 113* 123* 106* 80* 50* 37* 29* 24*  --   CREATININE 2.86* 2.94* 2.37* 1.68* 1.16 0.99 0.94 0.79  --   CALCIUM 8.4* 8.1* 8.4* 8.8* 8.8* 8.6* 8.4* 8.1*  --   MG 2.5* 2.5* 2.6*  --   --   --  2.0  --  2.0  PHOS  --   --   --  4.1 2.9 2.5 2.1* 1.9*  --     GFR: Estimated Creatinine Clearance: 76.6 mL/min (by C-G formula based on SCr of 0.79 mg/dL). Recent Labs  Lab 09/25/20 0738 09/26/20 0507 09/27/20 0635 09/28/20 0511  WBC 9.6 6.2 9.3 7.9     Liver Function Tests: Recent Labs  Lab 09/22/20 0500 09/23/20 0448 09/24/20 0431 09/25/20 0738 09/25/20 1703 09/26/20 0507 09/26/20 1708 09/27/20 0635 09/27/20 1545  AST 33 '30 30 27  '$ --  29  --   --   --   ALT '18 19 21 22  '$ --  20  --   --   --   ALKPHOS 66 62 56 58  --  50  --   --   --   BILITOT 0.8 1.5* 1.0 1.3*  --  1.0  --   --   --   PROT 6.6 6.9 6.6 6.7  --  5.8*  --   --   --   ALBUMIN 3.1* 3.3* 3.2* 3.3* 3.0* 2.8* 2.7* 2.9* 2.8*    No results for input(s): LIPASE, AMYLASE in the last 168 hours. No results for input(s): AMMONIA in the last 168 hours.  ABG    Component Value Date/Time   PHART 7.42 09/24/2020 1559   PCO2ART 44 09/24/2020 1559   PO2ART 77 (L) 09/24/2020 1559   HCO3 28.5 (H) 09/24/2020 1559   O2SAT 95.5 09/24/2020 1559      Coagulation Profile: No results for input(s): INR, PROTIME in the last 168 hours.   Cardiac Enzymes: No results for input(s): CKTOTAL, CKMB, CKMBINDEX, TROPONINI in the last 168 hours.  HbA1C: Hgb A1c MFr Bld  Date/Time Value Ref Range  Status  09/22/2020 05:00 AM 6.6 (H) 4.8 - 5.6 % Final    Comment:    (NOTE)         Prediabetes: 5.7 - 6.4         Diabetes: >6.4         Glycemic control for adults with diabetes: <7.0   12/02/2017 12:14 PM 8.5 (H) 4.8 - 5.6 % Final    Comment:    (NOTE) Pre diabetes:          5.7%-6.4% Diabetes:              >6.4% Glycemic control for   <7.0% adults with diabetes     CBG: Recent Labs  Lab 09/27/20 0831 09/27/20 0926 09/27/20 1147 09/27/20 1557 09/27/20 2107  GLUCAP 71 94 117* 120* 140*     Review of Systems:   Unable to assess due to language barrier/CAPR.  Allergies Allergies  Allergen Reactions   Carvedilol Shortness Of Breath    Other reaction(s): Asthma, Shortness of Breath   Penicillins Shortness Of Breath    Other reaction(s): Other (See Comments) Other reaction(s): RASH Other reaction(s): RASH Other reaction(s): RASH    Aspirin     Other reaction(s): Other (See Comments) On eliquis already- high risk of bleeding   Catapres [Clonidine Hcl]     Scheduled Meds:  sodium chloride   Intravenous Once   vitamin C  500 mg Oral Daily   atorvastatin  40 mg Oral q1800   Chlorhexidine Gluconate Cloth  6 each Topical Q0600   cholecalciferol  1,000 Units Oral Daily   donepezil  5 mg Oral QHS   DULoxetine  60 mg Oral Daily   feeding supplement (NEPRO CARB STEADY)  237 mL Oral TID BM   finasteride  5 mg Oral Daily   insulin aspart  0-9 Units Subcutaneous TID WC   insulin glargine-yfgn  24 Units Subcutaneous QHS   Ipratropium-Albuterol  2 puff Inhalation QID   latanoprost  1 drop Both Eyes QHS   levothyroxine  88 mcg Oral Q0600   loratadine  10 mg Oral Daily   midodrine  10 mg Oral TID WC   mometasone-formoterol  2 puff Inhalation BID   multivitamin  1 tablet Oral QHS   omega-3 acid ethyl esters  1 g Oral Daily   pantoprazole  40 mg Oral Daily   sildenafil  20 mg Oral TID   tamsulosin  0.4 mg Oral Daily   tiotropium  18 mcg Inhalation Daily   Vitamin D  (Ergocalciferol)  50,000 Units Oral Weekly   zinc sulfate  220 mg Oral Daily   Continuous Infusions:   prismasol BGK 4/2.5 300 mL/hr at 09/28/20 0222    prismasol BGK 4/2.5 300 mL/hr at 09/28/20 0210   azithromycin Stopped (09/27/20 1119)   dexmedetomidine (PRECEDEX) IV infusion 0.4 mcg/kg/hr (09/28/20 0700)   phenylephrine (NEO-SYNEPHRINE) Adult infusion 95 mcg/min (09/28/20 0700)   prismasol BGK 4/2.5 1,500 mL/hr at 09/28/20 0626   PRN Meds:.acetaminophen **OR** acetaminophen, chlorpheniramine-HYDROcodone, guaiFENesin-dextromethorphan, heparin, levalbuterol, magnesium hydroxide, melatonin, metoprolol tartrate, ondansetron **OR** ondansetron (ZOFRAN) IV, sodium chloride, traZODone   Critical care provider statement:   Total critical care time: 33 minutes   Performed by: Lanney Gins MD   Critical care time was exclusive of separately billable procedures and treating other patients.   Critical care was necessary to treat or prevent imminent or life-threatening deterioration.   Critical care was time spent personally by me on the following activities: development of treatment plan with patient and/or surrogate as well as nursing, discussions with consultants, evaluation of patient's response to treatment, examination of patient, obtaining history from patient or surrogate, ordering and performing treatments and interventions, ordering and review of laboratory studies, ordering and review of radiographic studies, pulse oximetry and re-evaluation of patient's condition.    Ottie Glazier, M.D.  Pulmonary & Critical Care Medicine

## 2020-09-29 LAB — CBC WITH DIFFERENTIAL/PLATELET
Abs Immature Granulocytes: 0.05 10*3/uL (ref 0.00–0.07)
Basophils Absolute: 0 10*3/uL (ref 0.0–0.1)
Basophils Relative: 0 %
Eosinophils Absolute: 0.2 10*3/uL (ref 0.0–0.5)
Eosinophils Relative: 3 %
HCT: 24.4 % — ABNORMAL LOW (ref 39.0–52.0)
Hemoglobin: 7.5 g/dL — ABNORMAL LOW (ref 13.0–17.0)
Immature Granulocytes: 1 %
Lymphocytes Relative: 4 %
Lymphs Abs: 0.4 10*3/uL — ABNORMAL LOW (ref 0.7–4.0)
MCH: 28.2 pg (ref 26.0–34.0)
MCHC: 30.7 g/dL (ref 30.0–36.0)
MCV: 91.7 fL (ref 80.0–100.0)
Monocytes Absolute: 0.6 10*3/uL (ref 0.1–1.0)
Monocytes Relative: 7 %
Neutro Abs: 7.2 10*3/uL (ref 1.7–7.7)
Neutrophils Relative %: 85 %
Platelets: 79 10*3/uL — ABNORMAL LOW (ref 150–400)
RBC: 2.66 MIL/uL — ABNORMAL LOW (ref 4.22–5.81)
RDW: 16.9 % — ABNORMAL HIGH (ref 11.5–15.5)
WBC: 8.4 10*3/uL (ref 4.0–10.5)
nRBC: 0.4 % — ABNORMAL HIGH (ref 0.0–0.2)

## 2020-09-29 LAB — RENAL FUNCTION PANEL
Albumin: 2.6 g/dL — ABNORMAL LOW (ref 3.5–5.0)
Anion gap: 2 — ABNORMAL LOW (ref 5–15)
BUN: 15 mg/dL (ref 8–23)
CO2: 30 mmol/L (ref 22–32)
Calcium: 7.8 mg/dL — ABNORMAL LOW (ref 8.9–10.3)
Chloride: 103 mmol/L (ref 98–111)
Creatinine, Ser: 0.77 mg/dL (ref 0.61–1.24)
GFR, Estimated: >60 mL/min (ref >60)
Glucose, Bld: 101 mg/dL — ABNORMAL HIGH (ref 70–99)
Phosphorus: 1.6 mg/dL — ABNORMAL LOW (ref 2.5–4.6)
Potassium: 4 mmol/L (ref 3.5–5.1)
Sodium: 135 mmol/L (ref 135–145)

## 2020-09-29 LAB — HEPARIN INDUCED PLATELET AB (HIT ANTIBODY): Heparin Induced Plt Ab: 0.129 OD (ref 0.000–0.400)

## 2020-09-29 LAB — GLUCOSE, CAPILLARY
Glucose-Capillary: 48 mg/dL — ABNORMAL LOW (ref 70–99)
Glucose-Capillary: 84 mg/dL (ref 70–99)
Glucose-Capillary: 49 mg/dL — ABNORMAL LOW (ref 70–99)
Glucose-Capillary: 86 mg/dL (ref 70–99)

## 2020-09-29 LAB — MAGNESIUM: Magnesium: 2.2 mg/dL (ref 1.7–2.4)

## 2020-09-29 MED ORDER — SODIUM CHLORIDE 0.9 % IV BOLUS
500.0000 mL | Freq: Once | INTRAVENOUS | Status: AC
  Administered 2020-09-29: 500 mL via INTRAVENOUS

## 2020-09-29 MED ORDER — DEXTROSE 50 % IV SOLN: 25.0000 g | INTRAVENOUS | Status: AC

## 2020-09-29 MED ORDER — DEXTROSE 50 % IV SOLN
INTRAVENOUS | Status: AC
  Administered 2020-09-29: 25 g via INTRAVENOUS
  Filled 2020-09-29: qty 50

## 2020-09-29 MED ORDER — DEXTROSE 50 % IV SOLN
25.0000 g | INTRAVENOUS | Status: AC
  Administered 2020-09-29: 25 g via INTRAVENOUS

## 2020-09-30 LAB — SEROTONIN RELEASE ASSAY (SRA)
SRA .2 IU/mL UFH Ser-aCnc: 6 % (ref 0–20)
SRA 100IU/mL UFH Ser-aCnc: 5 % (ref 0–20)

## 2020-10-01 ENCOUNTER — Ambulatory Visit: Payer: Medicare Other | Admitting: Family

## 2020-10-24 NOTE — Progress Notes (Signed)
PHARMACY CONSULT NOTE - FOLLOW UP  Pharmacy Consult for Electrolyte Monitoring and Replacement   Recent Labs: Potassium (mmol/L)  Date Value  10/16/2020 4.0  05/11/2014 3.8   Magnesium (mg/dL)  Date Value  Oct 16, 2020 2.2   Calcium (mg/dL)  Date Value  October 16, 2020 7.8 (L)   Calcium, Total (mg/dL)  Date Value  05/11/2014 8.0 (L)   Albumin (g/dL)  Date Value  Oct 16, 2020 2.6 (L)  05/06/2014 4.0   Phosphorus (mg/dL)  Date Value  10-16-20 1.6 (L)   Sodium (mmol/L)  Date Value  10-16-2020 135  05/11/2014 132 (L)     Assessment: 80 year old male with PMH COPD, diabetes, HLD, HTN and hypothyroidism. He was admitted with SOB and COPD exacerbation as well as Covid. Patient with afib on admission. AKI on CKD started on CRRT.  Goal of Therapy:  Electrolytes WNL  Plan:  --No replacement indicated --Remains on CRRT --Continue to follow along  Tawnya Crook, PharmD, BCPS Clinical Pharmacist 2020-10-16 12:13 PM

## 2020-10-24 NOTE — Procedures (Signed)
Central Venous Catheter Insertion Procedure Note  Derek Shelton  [id number]  May 01, 1940  Date:10-16-20  Time:2:28 AM   Provider Performing:Aalina Brege Jannette Fogo   Procedure: Insertion of Non-tunneled Central Venous Catheter([id number])with US guidance [id number])    Indication(s) Hemodialysis  Consent Risks of the procedure as well as the alternatives and risks of each were explained to the patient and/or caregiver.  Consent for the procedure was obtained and is signed in the bedside chart  Anesthesia Topical only with 1% lidocaine   Timeout Verified patient identification, verified procedure, site/side was marked, verified correct patient position, special equipment/implants available, medications/allergies/relevant history reviewed, required imaging and test results available.  Sterile Technique Maximal sterile technique including full sterile barrier drape, hand hygiene, sterile gown, sterile gloves, mask, hair covering, sterile ultrasound probe cover (if used).  Procedure Description Area of catheter insertion was cleaned with chlorhexidine and draped in sterile fashion.   With real-time ultrasound guidance a HD catheter was placed into the right femoral vein.  Nonpulsatile blood flow and easy flushing noted in all ports.  The catheter was sutured in place and sterile dressing applied.  Complications/Tolerance None; patient tolerated the procedure well. Chest X-ray is ordered to verify placement for internal jugular or subclavian cannulation.  Chest x-ray is not ordered for femoral cannulation.  EBL Minimal  Specimen(s) None  Pt agitated and dislodged an estimated 6 cm of hemodialysis catheter.  Therefore, exchanged hemodialysis catheter over a guidewire visualized guidewire in femoral vein utilizing ultrasound.  No complications noted during procedure.   Rosilyn Mings, AGNP  Pulmonary/Critical Care Pager [phone] (please enter 7 digits) PCCM Consult Pager [phone] (please  enter 7 digits)

## 2020-10-24 NOTE — Progress Notes (Signed)
Patient was noted to be pulseless and without respirations.  Called a code blue and began CPR.

## 2020-10-24 NOTE — Evaluation (Signed)
Clinical/Bedside Swallow Evaluation Patient Details  Name: Derek Shelton MRN: [id number] Date of Birth: [birth year]  Today's Date: 05-Oct-2020 Time: SLP Start Time (ACUTE ONLY): 77 SLP Stop Time (ACUTE ONLY): [id number] SLP Time Calculation (min) (ACUTE ONLY): 50 min  Past Medical History:  Past Medical History:  Diagnosis Date   Anemia    Asthma    CHF (congestive heart failure) (HCC)    COPD (chronic obstructive pulmonary disease) ([place])    Coronary artery disease    Dementia ([place])    Per son's report   Diabetes mellitus without complication ([place])    Dysrhythmia    Atrial Fibrillation   GERD (gastroesophageal reflux disease)    GI bleed    Hyperlipidemia    Hypertension    Hypothyroidism    Shortness of breath dyspnea    Sleep apnea    Stroke [facility])    Thyroid disease    Past Surgical History:  Past Surgical History:  Procedure Laterality Date   CARDIAC CATHETERIZATION     COLONOSCOPY N/A 08/10/2014   Procedure: COLONOSCOPY;  Surgeon: Manya Silvas, MD;  Location: [facility] ENDOSCOPY;  Service: Endoscopy;  Laterality: N/A;   COLONOSCOPY WITH PROPOFOL N/A 04/05/2016   Procedure: COLONOSCOPY WITH PROPOFOL;  Surgeon: Lollie Sails, MD;  Location: [facility] ENDOSCOPY;  Service: Endoscopy;  Laterality: N/A;   COLONOSCOPY WITH PROPOFOL N/A 07/10/2019   Procedure: COLONOSCOPY WITH PROPOFOL;  Surgeon: Lucilla Lame, MD;  Location: [facility] ENDOSCOPY;  Service: Endoscopy;  Laterality: N/A;   ESOPHAGOGASTRODUODENOSCOPY N/A 08/08/2014   Procedure: ESOPHAGOGASTRODUODENOSCOPY (EGD);  Surgeon: Manya Silvas, MD;  Location: [facility] ENDOSCOPY;  Service: Endoscopy;  Laterality: N/A;  plan for early afternoon   ESOPHAGOGASTRODUODENOSCOPY (EGD) WITH PROPOFOL N/A 07/10/2019   Procedure: ESOPHAGOGASTRODUODENOSCOPY (EGD) WITH PROPOFOL;  Surgeon: Lucilla Lame, MD;  Location: [facility] ENDOSCOPY;  Service: Endoscopy;  Laterality: N/A;   EYE SURGERY     GIVENS CAPSULE STUDY N/A 08/12/2014   Procedure: GIVENS CAPSULE  STUDY;  Surgeon: Manya Silvas, MD;  Location: [facility] ENDOSCOPY;  Service: Endoscopy;  Laterality: N/A;   rectal fistula N/A    HPI:  Pt is a 80 y.o. [name] male with medical history significant for Multiple issues including advanced Dementia, asthma, COPD, CHF, coronary artery disease, GERD, type 2 diabetes mellitus, hypertension, dyslipidemia and hypothyroidism, who presented to the emergency room after having a positive COVID with home antigen test.  The patient has been having dyspnea with associated productive cough and wheezing lately with significant diminished appetite and tachycardia at 130-140 per his son who provides the history over the phone.  He has been having nausea without vomiting or diarrhea.  Initial CXR at admit: chest ray showed cardiomegaly with central vascular congestion similar to prior radiograph. The patient was given 60 mg IV Lasix, 10 mg of IV Cardizem followed by IV Cardizem drip as well as DuoNeb's and 125 mg of IV Solu-Medrol.  Pt was admitted with acute on chronic hypoxic respiratory failure in the setting of acute COPD exacerbation and COVID-19 infection.  Course complicated by Acute Metabolic Encephalopathy in setting of AKI and Uremia requiring Dialysis.  Pt continues on CCRT w/ platelets low, changes in creatinine.   Assessment / Plan / Recommendation Clinical Impression  Pt appears to present w/ grossly functional oropharyngeal phase swallow function w/ modified liquid/food consistencies following general aspiration precautions and given support w/ feeding d/t his Baseline Cognitive decline and overall severity of his medical status currently. He is impacted by decreased awareness during oral prep  tasks/po intake d/t declined Cognitive status, baseline Dementia. Suspect his Cognitive decline could be impacting his overall awareness/engagement w/ po tasks which can increase risk for aspiration. He requires mod+ verbal/tactile/visual cues for orientation to food and  drink items present. Pt was positioned upright in bed w/ head forward then fed trials of Nectar liquids via TSP/straw/cup pinching straw to limit large sips followed by purees. No overt clinical s/s of aspiration noted; no decline in phonations/verbalizations, no cough, and no decline in respiratory status during/post trials. O2 sats remained in upper 90s. Oral phase was grossly [facility] for bolus management and oral clearing of the boluses given; he exhibited slight increased oral phase Time noted but cleared b/t trials adequately.    Discussed diet consistency of foods/Nectar liquids; aspiration precautions; pills Crushed in puree; feeding support and supervision at meals w/ NSG, Wife, and Son via [place]. Handouts posted at room, chart. Recommend a Dysphagia level 1(puree) w/ Nectar liquids; general aspiration precautions; reduce Distractions during meals; feeding support; monitoring of oral phase Time/needs for cue d/t decreased awareness. Pills Crushed in Puree for safer swallowing. Support at all meals d/t Cognitive decline/Dementia. Recommend Dietician f/u. NSG/MD updated. Palliative Care updated who will be following pt/family. SLP Visit Diagnosis: Dysphagia, oropharyngeal phase (R13.12) (declined medical status overall; baseline Dementia)    Aspiration Risk  Mild aspiration risk;Moderate aspiration risk;Risk for inadequate nutrition/hydration    Diet Recommendation     Medication Administration: Crushed with puree (vs Whole in puree)    Other  Recommendations Recommended Consults:  (Dietician f/u) Oral Care Recommendations: Oral care BID;Oral care before and after PO;Staff/trained caregiver to provide oral care Other Recommendations: Order thickener from pharmacy;Prohibited food (jello, ice cream, thin soups);Remove water pitcher;Have oral suction available   Follow up Recommendations  (TBD)      Frequency and Duration min 2x/week  2 weeks       Prognosis Prognosis for Safe Diet  Advancement: Fair Barriers to Reach Goals: Cognitive deficits;Time post onset;Severity of deficits      Swallow Study   General Date of Onset: 08/28/2020 HPI: Pt is a 80 y.o. [name] male with medical history significant for Multiple issues including advanced Dementia, asthma, COPD, CHF, coronary artery disease, GERD, type 2 diabetes mellitus, hypertension, dyslipidemia and hypothyroidism, who presented to the emergency room after having a positive COVID with home antigen test.  The patient has been having dyspnea with associated productive cough and wheezing lately with significant diminished appetite and tachycardia at 130-140 per his son who provides the history over the phone.  He has been having nausea without vomiting or diarrhea.  Initial CXR at admit: chest ray showed cardiomegaly with central vascular congestion similar to prior radiograph. The patient was given 60 mg IV Lasix, 10 mg of IV Cardizem followed by IV Cardizem drip as well as DuoNeb's and 125 mg of IV Solu-Medrol.  Pt was admitted with acute on chronic hypoxic respiratory failure in the setting of acute COPD exacerbation and COVID-19 infection.  Course complicated by Acute Metabolic Encephalopathy in setting of AKI and Uremia requiring Dialysis.  Pt continues on CCRT w/ platelets low, changes in creatinine. Type of Study: Bedside Swallow Evaluation Previous Swallow Assessment: none Diet Prior to this Study: Regular;Thin liquids (some difficulty w/ solids or thin liquids well per NSG) Temperature Spikes Noted: No (LOW TEMPS; wbc 8.4) Respiratory Status: Nasal cannula (2L) History of Recent Intubation: No Behavior/Cognition: Alert;Cooperative;Pleasant mood;Confused;Distractible;Requires cueing (Wife assisting) Oral Cavity Assessment:  (slight food residue) Oral Care Completed by  SLP: Yes Oral Cavity - Dentition:  (upper dentition) Vision:  (n/a) Self-Feeding Abilities: Total assist Patient Positioning: Upright in bed (needed  head forward positioning; support w/ positioning) Baseline Vocal Quality: Normal (when he spoke; intermittently mumbled but more energetic towards the end of session) Volitional Cough: Cognitively unable to elicit Volitional Swallow: Unable to elicit    Oral/Motor/Sensory Function Overall Oral Motor/Sensory Function: Within functional limits (w/ bolus management/acceptance - no unilateral weakness)   Ice Chips Ice chips: Not tested   Thin Liquid Thin Liquid: Not tested    Nectar Thick Nectar Thick Liquid: Within functional limits (grossly) Presentation: Spoon;Straw (fed; ~3+ ozs total) Other Comments: tactile cues required at times for awareness; pinching of straw to limit large sips   Honey Thick Honey Thick Liquid: Not tested   Puree Puree: Within functional limits (grossly) Presentation: Spoon (fed; 12+ trials) Other Comments: various puree foods   Solid     Solid: Not tested      Oluwademilade Kellett 2020-10-16,5:00 PM

## 2020-10-24 NOTE — Procedures (Signed)
PATIENT NAME: Derek Shelton MEDICAL RECORD NUMBER: [id number] Birthday: 07-Jan-1941  Age: 80 y.o. Admit Date: 08/30/2020  Provider: Lanney Gins  Indication: PEA arrest  Technical Description:  CPR performance duration: 43mn Was defibrillation or cardioversion used ? no Was external pacer placed ? no Was patient intubated pre/post CPR ? No per son POA [place] no intubation  Was transvenous pacer placed ? no  Medications Administered Include      Yes/no Amiodarone   Atropin   Calcium   Epinephrine Yes   Lidocaine   Magnesium   Norepinephrine   Phenylephrine   Sodium bicarbonate   Vasopression    Evaluation Final Status - Was patient successfully resuscitated ? No  If successfully resuscitated - what is current rhythm ? No  If successfully resuscitated - what is current hemodynamic status ? Patient expired   Miscellaneous Information Patient had bradycardia followed by PEA arrest , ACLS was immediately performed and wife was at bedside.  Patient has son who is [name] and was able to respond via face time video and asked to stop ACLS and allow patient to pass away.  Wife of patient was also present entire time.  Patient passed away at 1[place]909-26-2022:39 PM     FOttie Glazier M.D.  [place]ulmonary [facility] [place]

## 2020-10-24 NOTE — Progress Notes (Signed)
NAME:  Derek Shelton, MRN:  [id number], DOB:  May 24, 1940, LOS: 9 ADMISSION DATE:  08/24/2020, CONSULTATION DATE:  09/24/2020 REFERRING MD:  Dr. Loleta Books, CHIEF COMPLAINT:  Altered mental status, Acute on Chronic Hypoxic Respiratory Failure   Brief Pt Description / Synopsis:  80 year old male with a past medical history significant for COPD with FEV1 54% requiring 2 L home O2 at baseline, HFpEF, obesity, diabetes mellitus, coronary artery disease, hypertension, dementia and hypothyroidism admitted with acute on chronic hypoxic respiratory failure in the setting of acute COPD exacerbation and COVID-19 infection.  Course complicated by Acute Metabolic Encephalopathy in setting of AKI and Uremia requiring Dialysis.  09/28/20- patient is on room air, he is still on CRRT.  Platelets are low today, I suspect this may be due to CRRT circuit however will test for HITs.  He is tachycardic likely compensatory due to acutely ill state.   October 27, 2020- patient remains on multiple vasopressors, he is with confusion due to advanced dementia, I reviewed care plan with nephrologist and cardiologist and we agree that prognosis is poor.   History of Present Illness:  Derek Shelton is a 80 year old male (Pakistani speaking only) with a past medical history significant for COPD with FEV1 54% requiring 2 L supplemental O2 at home baseline, HFpEF, coronary artery disease, diabetes mellitus type 2, hypertension, dyslipidemia, and hypothyroidism who presented to [facility] ED on 09/24/2020 shortness of breath, productive cough, and wheezing.  He also endorsed nausea and poor appetite.  He denied vomiting, diarrhea, chest pain, palpitations, loss of taste or smell, dysuria.  Of note he did test positive for COVID with a home antigen test.  ED course: Vital signs: Pulse 120, respiratory rate 28, SPO2 88% on room air Labs: BUN 69, creatinine 1.75, BNP 288.7 COVID-19 PCR positive EKG with atrial fib with RVR and right bundle branch  block Chest x-ray with cardiomegaly and central vascular congestion  He was admitted by the hospitalist for further work-up and treatment of acute on chronic hypoxic respiratory failure in the setting of acute COPD repeat x-ray showed COVID-19 infection, and acute decompensated HFpEF.  Cardiology was consulted.  Hospital course: On 09/24/2020, creatinine worsened to which diuretics were held and was given IV fluids..  He was also noted to be more somnolent/lethargic with soft blood pressures.  He is being transferred to the stepdown unit for further work-up and treatment.  PCCM is consulted for further assistance with management of Acute Metabolic Encephalopathy, suspect secondary to Uremia.  Discussed with Nephrology, will plan for placement of temporary dialysis catheter placement and Dialysis.    Pertinent  Medical History  COPD on 2 L supplemental oxygen at baseline HFpEF Coronary artery disease Hypertension Hyperlipidemia Obesity Sleep apnea Dementia GERD GI bleed Hypothyroidism  Micro Data:  09/13/2020: SARS-CoV-2 PCR>> positive 09/04/2020: Influenza PCR>>negative 09/21/2020: Blood culture x2>>negative  09/23/2020: Urine culture>><10,000 colonies /mL insignificant growth   Antimicrobials:  Ceftriaxone 8/29 x1 dose Azithromycin 8/31>> Remdesivir 8/29>>9/1  Significant Hospital Events: Including procedures, antibiotic start and stop dates in addition to other pertinent events   09/24/20: Creatinine worsening, diuretics held. Pt with lethargy, transfer to ICU.  High risk for intubation.  Nephrology consulted for severe uremia.  Plan to place temporary dialysis catheter in anticipation of need for dialysis. 09/25/20: CRRT continued  09/26/20: Continues on CRRT 09/27/20: Continues on CRRT   Objective   BP (!) 102/47   Pulse 63   Temp (!) 97.2 F (36.2 C) (Oral)   Resp 10   Ht 5'  8" (1.727 m)   Wt 90.8 kg   SpO2 100%   BMI 30.44 kg/m          Intake/Output Summary (Last 24  hours) at Oct 15, 2020 0803 Last data filed at October 15, 2020 0800 Gross per 24 hour  Intake 1564.11 ml  Output 1715.86 ml  Net -151.75 ml    Filed Weights   09/26/20 0500 09/27/20 0435 10-15-20 0311  Weight: 83.1 kg 84.3 kg 90.8 kg    Examination: Physical examination is limited due to need for PPE/CAPR General: Chronically ill-appearing male, laying in bed, awake and alert, in no acute distress.  On CRRT.  HENT: Atraumatic, normocephalic, neck supple Lungs: Diminished breath sounds bilaterally with coarse breath sounds in bilateral upper fields, even, nonlabored Cardiovascular: Controlled ventricular response, irregular irregular rhythm (A. fib on telemetry), Abdomen: Obese, soft, nontender, nondistended, no guarding rebound tenderness, bowel sounds positive x4 Extremities: Normal bulk and tone, no deformities Neuro: Awake and alert, watching TV, no distress GU: External catheter in place  Resolved Hospital Problem list     Assessment & Plan:   Acute on Chronic Hypoxic Respiratory Failure in the setting of AECOPD, COVID-19 Infection, and suspected Acute Decompensated HFpEF PMHx of COPD on 2L supplemental O2 at baseline -Supplemental O2 as needed to maintain O2 sats >88% -Currently back to baseline O2 requirement -Follow intermittent Chest X-ray and ABG as needed -On CRRT -Remdesivir, completed 5 days -IV steroids discontinued~09/2  -Continue scheduled and PRN bronchodilators via MDI -Completed Azithromycin -Vitamin C, zinc  Acute on Chronic HFpEF Paroxsymal Atrial fibrillation A. Fib w/ RVR on admission Pulmonary hypertension due to diastolic dysfunction Echocardiogram from 07/17/20 with LVEF >55%, unable to evaluate diastolic parameters, increased pulmonary artery systolic pressure, and moderate to severe tricuspid valve regurgitation -Continuous cardiac monitoring -Maintain MAP >65 -Vasopressors as needed to maintain MAP goal -Cardiology following, appreciate input~-PRN iv  metoprolol for hr >115  -BUN and creatinine improved, urine output still low, volume challenged again today -He will need higher filling pressures given pulmonary hypertension -Hold outpatient apixaban, cardizem, and metoprolol  AKI on CKD Stage IIIa (baseline Cr. 1.5) Hyperkalemia-resolved  Hyponatremia -Monitor I&O's / urinary output -Follow BMP -Ensure adequate renal perfusion -Avoid nephrotoxic agents as able -Replace electrolytes as indicated -Nephrology initiated ~CRRT 09/01, continues today -Question DC CRRT  Acute Metabolic Encephalopathy, suspect secondary to Uremia also complicated by dementia~improving  -Provide supportive care -Avoid sedating meds as able -Precedex if needed -Frequent reorientation, challenging due to language barrier          -advanced dementia -SLP today  Acute on chronic anemia~black tarry stools during hospitalization  -Monitor for S/Sx of bleeding -Trend CBC -Hold outpatient eliquis  -Transfuse for Hgb <7  Diabetes Mellitus -CBG's q4h; Target range of 140 to 180 -SSI and scheduled semglee  -Follow ICU Hypo/Hyperglycemia protocol  Best Practice (right click and "Reselect all SmartList Selections" daily)   Diet/type: Carb modified  DVT prophylaxis: SCD's  GI prophylaxis: PPI Lines: Right femoral trialysis catheter  Foley:  N/A Code Status:  full code Last date of multidisciplinary goals of care discussion [09/25/2020]  Labs   CBC: Recent Labs  Lab 09/25/20 0738 09/26/20 0507 09/27/20 0635 09/28/20 0511 10/15/2020 0244  WBC 9.6 6.2 9.3 7.9 8.4  NEUTROABS 8.9* 5.4 7.8* 6.7 7.2  HGB 9.5* 7.8* 9.5* 7.8* 7.5*  HCT 30.6* 24.6* 30.3* 25.5* 24.4*  MCV 88.7 87.9 91.3 89.5 91.7  PLT 168 121* 153 97* 79*     Basic Metabolic Panel: Recent Labs  Lab 09/24/20 2252 09/25/20 [id number] 09/25/20 1703 09/26/20 0507 09/26/20 1708 09/27/20 0635 09/27/20 1545 09/28/20 0511 09/28/20 1518 Oct 10, 2020 0244  NA 131* 131*   < > 137 135 136 135  --   136  --   K 4.9 4.4   < > 3.5 3.4* 3.2* 3.4*  --  3.8  --   CL 93* 94*   < > 102 102 103 104  --  106  --   CO2 29 28   < > 32 '29 29 28  '$ --  30  --   GLUCOSE 114* 132*   < > 126* 120* 77 114*  --  104*  --   BUN 123* 106*   < > 50* 37* 29* 24*  --  16  --   CREATININE 2.94* 2.37*   < > 1.16 0.99 0.94 0.79  --  0.83  --   CALCIUM 8.1* 8.4*   < > 8.8* 8.6* 8.4* 8.1*  --  7.6*  --   MG 2.5* 2.6*  --   --   --  2.0  --  2.0  --  2.2  PHOS  --   --    < > 2.9 2.5 2.1* 1.9*  --  1.4*  --    < > = values in this interval not displayed.    GFR: Estimated Creatinine Clearance: 76.4 mL/min (by C-G formula based on SCr of 0.83 mg/dL). Recent Labs  Lab 09/26/20 0507 09/27/20 0635 09/28/20 0511 10/10/2020 0244  WBC 6.2 9.3 7.9 8.4     Liver Function Tests: Recent Labs  Lab 09/23/20 0448 09/24/20 0431 09/25/20 0738 09/25/20 1703 09/26/20 0507 09/26/20 1708 09/27/20 0635 09/27/20 1545 09/28/20 1518  AST '30 30 27  '$ --  29  --   --   --   --   ALT '19 21 22  '$ --  20  --   --   --   --   ALKPHOS 62 56 58  --  50  --   --   --   --   BILITOT 1.5* 1.0 1.3*  --  1.0  --   --   --   --   PROT 6.9 6.6 6.7  --  5.8*  --   --   --   --   ALBUMIN 3.3* 3.2* 3.3*   < > 2.8* 2.7* 2.9* 2.8* 2.7*   < > = values in this interval not displayed.    No results for input(s): LIPASE, AMYLASE in the last 168 hours. No results for input(s): AMMONIA in the last 168 hours.  ABG    Component Value Date/Time   PHART 7.42 09/24/2020 1559   PCO2ART 44 09/24/2020 1559   PO2ART 77 (L) 09/24/2020 1559   HCO3 28.5 (H) 09/24/2020 1559   O2SAT 95.5 09/24/2020 1559      Coagulation Profile: No results for input(s): INR, PROTIME in the last 168 hours.   Cardiac Enzymes: No results for input(s): CKTOTAL, CKMB, CKMBINDEX, TROPONINI in the last 168 hours.  HbA1C: Hgb A1c MFr Bld  Date/Time Value Ref Range Status  09/22/2020 05:00 AM 6.6 (H) 4.8 - 5.6 % Final    Comment:    (NOTE)         Prediabetes: 5.7  - 6.4         Diabetes: >6.4         Glycemic control for adults with diabetes: <7.0   12/02/2017 12:14 PM 8.5 (H)  4.8 - 5.6 % Final    Comment:    (NOTE) Pre diabetes:          5.7%-6.4% Diabetes:              >6.4% Glycemic control for   <7.0% adults with diabetes     CBG: Recent Labs  Lab 09/28/20 0808 09/28/20 1155 09/28/20 1516 09/28/20 2119 10-11-2020 0801  GLUCAP 87 127* 99 109* 48*     Review of Systems:   Unable to assess due to language barrier/CAPR.  Allergies Allergies  Allergen Reactions   Carvedilol Shortness Of Breath    Other reaction(s): Asthma, Shortness of Breath   Penicillins Shortness Of Breath    Other reaction(s): Other (See Comments) Other reaction(s): RASH Other reaction(s): RASH Other reaction(s): RASH    Aspirin     Other reaction(s): Other (See Comments) On eliquis already- high risk of bleeding   Catapres [Clonidine Hcl]     Scheduled Meds:  sodium chloride   Intravenous Once   vitamin C  500 mg Oral Daily   atorvastatin  40 mg Oral q1800   Chlorhexidine Gluconate Cloth  6 each Topical Q0600   cholecalciferol  1,000 Units Oral Daily   donepezil  5 mg Oral QHS   DULoxetine  60 mg Oral Daily   feeding supplement (NEPRO CARB STEADY)  237 mL Oral TID BM   finasteride  5 mg Oral Daily   insulin aspart  0-9 Units Subcutaneous TID WC   insulin glargine-yfgn  24 Units Subcutaneous QHS   Ipratropium-Albuterol  2 puff Inhalation QID   latanoprost  1 drop Both Eyes QHS   levothyroxine  88 mcg Oral Q0600   loratadine  10 mg Oral Daily   midodrine  10 mg Oral TID WC   mometasone-formoterol  2 puff Inhalation BID   multivitamin  1 tablet Oral QHS   omega-3 acid ethyl esters  1 g Oral Daily   pantoprazole  40 mg Oral Daily   sildenafil  20 mg Oral TID   tamsulosin  0.4 mg Oral Daily   tiotropium  18 mcg Inhalation Daily   Vitamin D (Ergocalciferol)  50,000 Units Oral Weekly   zinc sulfate  220 mg Oral Daily   Continuous Infusions:    prismasol BGK 4/2.5 300 mL/hr at 09/28/20 1311    prismasol BGK 4/2.5 300 mL/hr at 09/28/20 0210   dexmedetomidine (PRECEDEX) IV infusion 1.6 mcg/kg/hr (10/11/20 0759)   phenylephrine (NEO-SYNEPHRINE) Adult infusion 60 mcg/min (2020-10-11 0759)   prismasol BGK 4/2.5 1,500 mL/hr at 09/28/20 1230   vasopressin 0.04 Units/min (2020/10/11 0759)   PRN Meds:.acetaminophen **OR** acetaminophen, chlorpheniramine-HYDROcodone, guaiFENesin-dextromethorphan, heparin, levalbuterol, magnesium hydroxide, melatonin, metoprolol tartrate, ondansetron **OR** ondansetron (ZOFRAN) IV, sodium chloride, traZODone   Critical care provider statement:   Total critical care time: 33 minutes   Performed by: Lanney Gins MD   Critical care time was exclusive of separately billable procedures and treating other patients.   Critical care was necessary to treat or prevent imminent or life-threatening deterioration.   Critical care was time spent personally by me on the following activities: development of treatment plan with patient and/or surrogate as well as nursing, discussions with consultants, evaluation of patient's response to treatment, examination of patient, obtaining history from patient or surrogate, ordering and performing treatments and interventions, ordering and review of laboratory studies, ordering and review of radiographic studies, pulse oximetry and re-evaluation of patient's condition.    Ottie Glazier, M.D.  Pulmonary & Critical Care Medicine

## 2020-10-24 NOTE — Plan of Care (Signed)
PMT note:  Consult for [place] noted. Patient is working with other disciplines at this time. Will re-attempt tomorrow.   No charge.

## 2020-10-24 NOTE — Progress Notes (Signed)
Inpatient Diabetes Program Recommendations  AACE/ADA: New Consensus Statement on Inpatient Glycemic Control (2015)  Target Ranges:  Prepandial:   less than 140 mg/dL      Peak postprandial:   less than 180 mg/dL (1-2 hours)      Critically ill patients:  140 - 180 mg/dL   Lab Results  Component Value Date   GLUCAP 84 2020-10-08   HGBA1C 6.6 (H) 09/22/2020    Review of Glycemic Control Results for Derek Shelton, Derek Shelton (MRN [id number]) as of October 08, 2020 10:38  Ref. Range 09/28/2020 08:08 09/28/2020 11:55 09/28/2020 15:16 09/28/2020 21:19 2020/10/08 08:01 October 08, 2020 08:31  Glucose-Capillary Latest Ref Range: 70 - 99 mg/dL 87 127 (H) 99 109 (H) 48 (L) 84   Inpatient Diabetes Program Recommendations:   Please consider: -Decrease Semglee to 18 units  Thank you, Bethena Roys E. Dunya Meiners, RN, MSN, CDE  Diabetes Coordinator Inpatient Glycemic Control Team Team Pager [phone] (8am-5pm) 10/08/2020 10:40 AM

## 2020-10-24 NOTE — Progress Notes (Signed)
[place], [place] October 15, 2020  Subjective:   Hospital day # 9  Patient known to our practice from outpatient and is followed patient was last seen in March 2021.  He has medical problems of chronic diastolic CHF, hypertension, insulin-dependent type 2 diabetes, coronary artery disease, dementia, atrial fibrillation, hypothyroidism, osteoarthritis, obstructive sleep apnea.  Baseline creatinine 1.7 with an EGFR of 40.  Patient was admitted on 8/28 for evaluation of shortness of breath and home positive COVID test.  Patient is not able to provide any meaningful information because of illness and dementia.  All information is obtained from the chart as well as from primary team.  -Yesterday patient did pull out dialysis catheter.  It was subsequently replaced.  He continues to have low urine output of 100 cc over the preceding 24 hours.  Remains on 2 pressors at the moment.  Blood pressure has been as low as 67/51 today.  Case discussed with the patient's son who is also a Occupational hygienist care physician.  Renal: 09/05 0701 - 09/06 0700 In: 1476.5 [I.V.:1351.5; IV Piggyback:125] Out: 1670.5 [Urine:50] Lab Results  Component Value Date   CREATININE 0.77 2020-10-15   CREATININE 0.83 09/28/2020   CREATININE 0.79 09/27/2020     Objective:  Vital signs in last 24 hours:  Temp:  [93.4 F (34.1 C)-97.2 F (36.2 C)] 93.4 F (34.1 C) (09/06 1200) Pulse Rate:  [25-144] 25 (09/06 1200) Resp:  [0-10] 10 (09/05 2000) BP: (67-143)/(28-93) 118/88 (09/06 1500) SpO2:  [86 %-100 %] 100 % (09/06 1200) Weight:  [90.8 kg] 90.8 kg (09/06 0311)  Weight change:  Filed Weights   09/26/20 0500 09/27/20 0435 2020-10-15 0311  Weight: 83.1 kg 84.3 kg 90.8 kg    Intake/Output:    Intake/Output Summary (Last 24 hours) at October 15, 2020 1522 Last data filed at 2020/10/15 1502 Gross per 24 hour  Intake 2023.23 ml  Output 1664.9 ml  Net 358.33 ml      Physical Exam: General:  Critically ill-appearing, laying in the bed  HEENT Moist oral mucous membranes, anicteric sclera  Pulm/lungs Normal effort, non labored  CVS/Heart Tachycardic  Abdomen:  Soft, nontender  Extremities: 1+ lower extremity edema  Neurologic: Arousable but confused  Skin: warm, dry  Access: Right femoral temporary dialysis catheter       Basic Metabolic Panel:  Recent Labs  Lab 09/24/20 2252 09/25/20 0738 09/25/20 1703 09/26/20 1708 09/27/20 0635 09/27/20 1545 09/28/20 0511 09/28/20 1518 10-15-2020 0244  NA 131* 131*   < > 135 136 135  --  136 135  K 4.9 4.4   < > 3.4* 3.2* 3.4*  --  3.8 4.0  CL 93* 94*   < > 102 103 104  --  106 103  CO2 29 28   < > $R'29 29 28  'Pq$ --  30 30  GLUCOSE 114* 132*   < > 120* 77 114*  --  104* 101*  BUN 123* 106*   < > 37* 29* 24*  --  16 15  CREATININE 2.94* 2.37*   < > 0.99 0.94 0.79  --  0.83 0.77  CALCIUM 8.1* 8.4*   < > 8.6* 8.4* 8.1*  --  7.6* 7.8*  MG 2.5* 2.6*  --   --  2.0  --  2.0  --  2.2  PHOS  --   --    < > 2.5 2.1* 1.9*  --  1.4* 1.6*   < > = values in this interval not  displayed.      CBC: Recent Labs  Lab 09/25/20 0738 09/26/20 0507 09/27/20 0635 09/28/20 0511 10-26-2020 0244  WBC 9.6 6.2 9.3 7.9 8.4  NEUTROABS 8.9* 5.4 7.8* 6.7 7.2  HGB 9.5* 7.8* 9.5* 7.8* 7.5*  HCT 30.6* 24.6* 30.3* 25.5* 24.4*  MCV 88.7 87.9 91.3 89.5 91.7  PLT 168 121* 153 97* 79*      No results found for: HEPBSAG, HEPBSAB, HEPBIGM    Microbiology:  Recent Results (from the past 240 hour(s))  Urine Culture     Status: Abnormal   Collection Time: 08/29/2020  9:28 PM   Specimen: Urine, Random  Result Value Ref Range Status   Specimen Description   Final    URINE, RANDOM Performed at [facility], [address], [place], [place] [address]    Special Requests   Final    NONE Performed at [facility], [address] [place], [place] [address]    Culture (A)  Final    <10,000 COLONIES/mL INSIGNIFICANT GROWTH Performed at  [facility], [place] [address] [place], [place] [address]    Report Status 09/23/2020 FINAL  Final  Blood Culture (routine x 2)     Status: None   Collection Time: 08/30/2020  9:28 PM   Specimen: BLOOD  Result Value Ref Range Status   Specimen Description BLOOD BLOOD RIGHT FOREARM  Final   Special Requests   Final    BOTTLES DRAWN AEROBIC AND ANAEROBIC Blood Culture results may not be optimal due to an inadequate volume of blood received in culture bottles   Culture   Final    NO GROWTH 5 DAYS Performed at [facility], [address] [place], [place] [address]    Report Status 09/25/2020 FINAL  Final  Blood Culture (routine x 2)     Status: None   Collection Time: 09/14/2020  9:28 PM   Specimen: BLOOD  Result Value Ref Range Status   Specimen Description BLOOD RIGHT ANTECUBITAL  Final   Special Requests   Final    BOTTLES DRAWN AEROBIC AND ANAEROBIC Blood Culture adequate volume   Culture   Final    NO GROWTH 5 DAYS Performed at [facility], [address] [place], [place] [address]    Report Status 09/25/2020 FINAL  Final  Resp Panel by RT-PCR (Flu A&B, Covid) Nasopharyngeal Swab     Status: Abnormal   Collection Time: 08/30/2020  9:28 PM   Specimen: Nasopharyngeal Swab; Nasopharyngeal(NP) swabs in vial transport medium  Result Value Ref Range Status   SARS Coronavirus 2 by RT PCR POSITIVE (A) NEGATIVE Final    Comment: RESULT CALLED TO, READ BACK BY AND VERIFIED WITH: MELISSA HOUP$[email or web address]$  08/28/2020 RH (NOTE) SARS-CoV-2 target nucleic acids are DETECTED.  The SARS-CoV-2 RNA is generally detectable in upper respiratory specimens during the acute phase of infection. Positive results are indicative of the presence of the identified virus, but do not rule out bacterial infection or co-infection with other pathogens not detected by the test. Clinical correlation with patient history and other diagnostic information is necessary to determine patient infection  status. The expected result is Negative.  Fact Sheet for Patients: EntrepreneurPulse.com.au  Fact Sheet for Healthcare Providers: IncredibleEmployment.be  This test is not yet approved or cleared by the [place] FDA and  has been authorized for detection and/or diagnosis of SARS-CoV-2 by FDA under an Emergency Use Authorization (EUA).  This EUA will remain in effect (meaning this test can  be used ) for the duration of  the COVID-19 declaration under Section 564(b)(1) of the Act, 21 U.S.C. section 360bbb-3(b)(1), unless the authorization is terminated or revoked sooner.     Influenza A by PCR NEGATIVE NEGATIVE Final   Influenza B by PCR NEGATIVE NEGATIVE Final    Comment: (NOTE) The Xpert Xpress SARS-CoV-2/FLU/RSV plus assay is intended as an aid in the diagnosis of influenza from Nasopharyngeal swab specimens and should not be used as a sole basis for treatment. Nasal washings and aspirates are unacceptable for Xpert Xpress SARS-CoV-2/FLU/RSV testing.  Fact Sheet for Patients: EntrepreneurPulse.com.au  Fact Sheet for Healthcare Providers: IncredibleEmployment.be  This test is not yet approved or cleared by the [place] FDA and has been authorized for detection and/or diagnosis of SARS-CoV-2 by FDA under an Emergency Use Authorization (EUA). This EUA will remain in effect (meaning this test can be used) for the duration of the COVID-19 declaration under Section 564(b)(1) of the Act, 21 U.S.C. section 360bbb-3(b)(1), unless the authorization is terminated or revoked.  Performed at [facility], [address] [place], [place] [address]   MRSA Next Gen by PCR, Nasal     Status: None   Collection Time: 09/24/20  5:31 PM   Specimen: Nasal Mucosa; Nasal Swab  Result Value Ref Range Status   MRSA by PCR Next Gen NOT DETECTED NOT DETECTED Final    Comment: (NOTE) The GeneXpert MRSA  Assay (FDA approved for NASAL specimens only), is one component of a comprehensive MRSA colonization surveillance program. It is not intended to diagnose MRSA infection nor to guide or monitor treatment for MRSA infections. Test performance is not FDA approved in patients less than 84 years old. Performed at [facility], [address] [place], [place] [address]     Coagulation Studies: No results for input(s): LABPROT, INR in the last 72 hours.  Urinalysis: No results for input(s): COLORURINE, LABSPEC, PHURINE, GLUCOSEU, HGBUR, BILIRUBINUR, KETONESUR, PROTEINUR, UROBILINOGEN, NITRITE, LEUKOCYTESUR in the last 72 hours.  Invalid input(s): APPERANCEUR    Imaging: No results found.   Medications:     prismasol BGK 4/2.5 300 mL/hr at October 12, 2020 1155    prismasol BGK 4/2.5 300 mL/hr at 10-12-2020 1418   dexmedetomidine (PRECEDEX) IV infusion Stopped (2020/10/12 1148)   phenylephrine (NEO-SYNEPHRINE) Adult infusion 60 mcg/min (10/12/2020 1502)   prismasol BGK 4/2.5 1,500 mL/hr at October 12, 2020 1208   vasopressin Stopped (10/12/20 1048)    sodium chloride   Intravenous Once   vitamin C  500 mg Oral Daily   atorvastatin  40 mg Oral q1800   Chlorhexidine Gluconate Cloth  6 each Topical Q0600   cholecalciferol  1,000 Units Oral Daily   donepezil  5 mg Oral QHS   DULoxetine  60 mg Oral Daily   finasteride  5 mg Oral Daily   insulin aspart  0-9 Units Subcutaneous TID WC   Ipratropium-Albuterol  2 puff Inhalation QID   latanoprost  1 drop Both Eyes QHS   levothyroxine  88 mcg Oral Q0600   loratadine  10 mg Oral Daily   midodrine  10 mg Oral TID WC   mometasone-formoterol  2 puff Inhalation BID   multivitamin  1 tablet Oral QHS   omega-3 acid ethyl esters  1 g Oral Daily   pantoprazole  40 mg Oral Daily   sildenafil  20 mg Oral TID   tamsulosin  0.4 mg Oral Daily   tiotropium  18 mcg Inhalation Daily   Vitamin D (Ergocalciferol)  50,000 Units Oral Weekly   zinc sulfate  220 mg  Oral Daily   acetaminophen **OR** acetaminophen, chlorpheniramine-HYDROcodone, guaiFENesin-dextromethorphan, heparin, levalbuterol, magnesium hydroxide, melatonin, metoprolol tartrate, ondansetron **OR** ondansetron (ZOFRAN) IV, sodium chloride, traZODone  Assessment/ Plan:  80 y.o. male with  medical problems of chronic diastolic CHF, hypertension, insulin-dependent type 2 diabetes, coronary artery disease, dementia, atrial fibrillation, hypothyroidism, osteoarthritis, obstructive sleep apnea  admitted on 09/16/2020 for SOB (shortness of breath) [R06.02] COPD exacerbation (HCC) [J44.1] Acute CHF (congestive heart failure) (HCC) [I50.9] Acute on chronic diastolic congestive heart failure (HCC) [I50.33] Atrial fibrillation with RVR ([place]) [I48.91] COVID-19 [U07.1]  2D echo: June 29, 2020: LVEF greater than 55%, mild LVH, diastolic function indeterminate, moderately elevated pulmonary artery systolic pressure, estimated 45 mm, moderate to severe tricuspid regurgitation  #Acute kidney injury, anuric. #Hyperkalemia- Resolved. #Chronic kidney disease stage IIIb baseline EGFR 40 AKI likely secondary to ATN from concurrent illness, hypotension.  CRRT started to address azotemia, uremia, hyperkalemia and for metabolic optimization -Renal parameters remain in good balance.  BUN currently 15 with a creatinine of 0.7, potassium 4.0, and bicarbonate 30.  Continues to have significant volume input from pressors.  Case discussed in depth with the patient's son who is a Occupational hygienist care physician.  For now we will continue on with CRRT after discussion with patient's son.  Palliative care has been consulted as well.  We do plan to bolus 500 cc normal saline today as well.  #Acute on chronic hypoxic respiratory failure in setting of COPD, COVID-19 infection -Respiratory status appears to be stable.  Still quite tachycardic.  #Pulmonary hypertension, severe tricuspid regurgitation -Currently keeping net  volume balance even with CRRT  #Anemia of CKD.  Hemoglobin down slightly to 7.5.  No immediate need for blood transfusion.   LOS: 9 Daysi Boggan Sep 23, 20223:22 PM  [facility] [place], [place]  Note: This note was prepared with Dragon dictation. Any transcription errors are unintentional

## 2020-10-24 NOTE — Plan of Care (Signed)
Patient remains in the ICU, alert to self; very confused during this shift. Patient became very anxious and required oxygen; he is now on 3L nasal canula. Remains in a-fib and is now on neo and vaso. Patient is oliguric with only 73m of urine this shift and only [place] on bladder scanner. Patient turned q2, no skin issues observed. Lines remain and CRRT is running with no complications.   Problem: Skin Integrity: Goal: Risk for impaired skin integrity will decrease Outcome: Progressing   Problem: Safety: Goal: Ability to remain free from injury will improve Outcome: Not Progressing Note: Patient gets very confused at night; trying to get out of bed and needs constant redirection. Bed remains in lowest position, wheels locked, and bed alarms activated.    Problem: Elimination: Goal: Will not experience complications related to bowel motility Outcome: Progressing   Problem: Clinical Measurements: Goal: Respiratory complications will improve Outcome: Progressing   Problem: Clinical Measurements: Goal: Will remain free from infection Outcome: Progressing

## 2020-10-24 NOTE — Death Summary Note (Signed)
NAME:  [name], MRN:  [id number], DOB:  Feb 12, 1940, LOS: 9 ADMISSION DATE:  09/19/2020, CONSULTATION DATE:  09/24/2020 REFERRING MD:  Dr. Loleta Books, CHIEF COMPLAINT:  Altered mental status, Acute on Chronic Hypoxic Respiratory Failure   Brief Pt Description / Synopsis:  80 year old male with a past medical history significant for COPD with FEV1 54% requiring 2 L home O2 at baseline, HFpEF, obesity, diabetes mellitus, coronary artery disease, hypertension, dementia and hypothyroidism admitted with acute on chronic hypoxic respiratory failure in the setting of acute COPD exacerbation and COVID-19 infection.  Course complicated by Acute Metabolic Encephalopathy in setting of AKI and Uremia requiring Dialysis.  09/28/20- patient is on room air, he is still on CRRT.  Platelets are low today, I suspect this may be due to CRRT circuit however will test for HITs.  He is tachycardic likely compensatory due to acutely ill state.   October 04, 2020- patient remains on multiple vasopressors, he is with confusion due to advanced dementia, I reviewed care plan with nephrologist and cardiologist and we agree that prognosis is poor.    PATIENT HAD PEA ARREST AND HAD FULL ACLS IMMEDIATELY , WIFE WAS AT BEDSIDE AND CONTACTED VIA FACETIME VIDEO SON OVAIS SIDDQUI WHO SPOKE WITH HIS MOTHER AND OUR STAFF TO STOP CPR AND ALLOW PATIENT TO PASS AWAY.  PATIENT PASSED AWAY AT 1536. MAY HE REST IN PEACE.   History of Present Illness:  Derek Shelton is a 80 year old male (Pakistani speaking only) with a past medical history significant for COPD with FEV1 54% requiring 2 L supplemental O2 at home baseline, HFpEF, coronary artery disease, diabetes mellitus type 2, hypertension, dyslipidemia, and hypothyroidism who presented to [facility] ED on 09/24/2020 shortness of breath, productive cough, and wheezing.  He also endorsed nausea and poor appetite.  He denied vomiting, diarrhea, chest pain, palpitations, loss of taste or smell, dysuria.  Of  note he did test positive for COVID with a home antigen test.  ED course: Vital signs: Pulse 120, respiratory rate 28, SPO2 88% on room air Labs: BUN 69, creatinine 1.75, BNP 288.7 COVID-19 PCR positive EKG with atrial fib with RVR and right bundle branch block Chest x-ray with cardiomegaly and central vascular congestion  He was admitted by the hospitalist for further work-up and treatment of acute on chronic hypoxic respiratory failure in the setting of acute COPD repeat x-ray showed COVID-19 infection, and acute decompensated HFpEF.  Cardiology was consulted.  Hospital course: On 09/24/2020, creatinine worsened to which diuretics were held and was given IV fluids..  He was also noted to be more somnolent/lethargic with soft blood pressures.  He is being transferred to the stepdown unit for further work-up and treatment.  PCCM is consulted for further assistance with management of Acute Metabolic Encephalopathy, suspect secondary to Uremia.  Discussed with Nephrology, will plan for placement of temporary dialysis catheter placement and Dialysis.    Pertinent  Medical History  COPD on 2 L supplemental oxygen at baseline HFpEF Coronary artery disease Hypertension Hyperlipidemia Obesity Sleep apnea Dementia GERD GI bleed Hypothyroidism  Micro Data:  09/23/2020: SARS-CoV-2 PCR>> positive 09/14/2020: Influenza PCR>>negative 09/11/2020: Blood culture x2>>negative  09/23/2020: Urine culture>><10,000 colonies /mL insignificant growth   Antimicrobials:  Ceftriaxone 8/29 x1 dose Azithromycin 8/31>> Remdesivir 8/29>>9/1  Significant Hospital Events: Including procedures, antibiotic start and stop dates in addition to other pertinent events   09/24/20: Creatinine worsening, diuretics held. Pt with lethargy, transfer to ICU.  High risk for intubation.  Nephrology consulted for severe uremia.  Plan to place temporary dialysis catheter in anticipation of need for dialysis. 09/25/20: CRRT  continued  09/26/20: Continues on CRRT 09/27/20: Continues on CRRT   Objective   BP 118/88   Pulse (!) 25   Temp (!) 93.4 F (34.1 C) (Axillary)   Resp 10   Ht '5\' 8"'$  (1.727 m)   Wt 90.8 kg   SpO2 100%   BMI 30.44 kg/m          Intake/Output Summary (Last 24 hours) at 24-Oct-2020 1547 Last data filed at 2020-10-24 1502 Gross per 24 hour  Intake 2023.23 ml  Output 1664.9 ml  Net 358.33 ml    Filed Weights   09/26/20 0500 09/27/20 0435 2020-10-24 0311  Weight: 83.1 kg 84.3 kg 90.8 kg    Examination: Physical examination is limited due to need for PPE/CAPR General: Chronically ill-appearing male, laying in bed, awake and alert, in no acute distress.  On CRRT.  HENT: Atraumatic, normocephalic, neck supple Lungs: Diminished breath sounds bilaterally with coarse breath sounds in bilateral upper fields, even, nonlabored Cardiovascular: Controlled ventricular response, irregular irregular rhythm (A. fib on telemetry), Abdomen: Obese, soft, nontender, nondistended, no guarding rebound tenderness, bowel sounds positive x4 Extremities: Normal bulk and tone, no deformities Neuro: Awake and alert, watching TV, no distress GU: External catheter in place  Resolved Hospital Problem list     Assessment & Plan:   Acute on Chronic Hypoxic Respiratory Failure in the setting of AECOPD, COVID-19 Infection, and suspected Acute Decompensated HFpEF PMHx of COPD on 2L supplemental O2 at baseline -Supplemental O2 as needed to maintain O2 sats >88% -Currently back to baseline O2 requirement -Follow intermittent Chest X-ray and ABG as needed -On CRRT -Remdesivir, completed 5 days -IV steroids discontinued~09/2  -Continue scheduled and PRN bronchodilators via MDI -Completed Azithromycin -Vitamin C, zinc  Acute on Chronic HFpEF Paroxsymal Atrial fibrillation A. 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 1.5) Hyperkalemia-resolved  Hyponatremia -Monitor I&O's / urinary output -Follow BMP -Ensure adequate renal perfusion -Avoid nephrotoxic agents as able -Replace electrolytes as indicated -Nephrology initiated ~CRRT 09/01, continues today -Question DC CRRT  Acute Metabolic Encephalopathy, suspect secondary to Uremia also complicated by dementia~improving  -Provide supportive care -Avoid sedating meds as able -Precedex if needed -Frequent reorientation, challenging due to language barrier          -advanced dementia -SLP today  Acute on chronic anemia~black tarry stools during hospitalization  -Monitor for S/Sx of bleeding -Trend CBC -Hold outpatient eliquis  -Transfuse for Hgb <7  Diabetes Mellitus -CBG's q4h; Target range of 140 to 180 -SSI and scheduled semglee  -Follow ICU Hypo/Hyperglycemia protocol  Best Practice (right click and "Reselect all SmartList Selections" daily)   Diet/type: Carb modified  DVT prophylaxis: SCD's  GI prophylaxis: PPI Lines: Right femoral trialysis catheter  Foley:  N/A Code Status:  full code Last date of multidisciplinary goals of care discussion [09/25/2020]  Labs   CBC: Recent Labs  Lab 09/25/20 0738 09/26/20 0507 09/27/20 [id number] 09/28/20 [id number]  10/29/2020 0244  WBC 9.6 6.2 9.3 7.9 8.4  NEUTROABS 8.9* 5.4 7.8* 6.7 7.2  HGB 9.5* 7.8* 9.5* 7.8* 7.5*  HCT 30.6* 24.6* 30.3* 25.5* 24.4*  MCV 88.7 87.9 91.3  89.5 91.7  PLT 168 121* 153 97* 79*     Basic Metabolic Panel: Recent Labs  Lab 09/24/20 2252 09/25/20 0738 09/25/20 1703 09/26/20 1708 09/27/20 0635 09/27/20 1545 09/28/20 0511 09/28/20 1518 October 29, 2020 0244  NA 131* 131*   < > 135 136 135  --  136 135  K 4.9 4.4   < > 3.4* 3.2* 3.4*  --  3.8 4.0  CL 93* 94*   < > 102 103 104  --  106 103  CO2 29 28   < > '29 29 28  '$ --  30 30  GLUCOSE 114* 132*   < > 120* 77 114*  --  104* 101*  BUN 123* 106*   < > 37* 29* 24*  --  16 15  CREATININE 2.94* 2.37*   < > 0.99 0.94 0.79  --  0.83 0.77  CALCIUM 8.1* 8.4*   < > 8.6* 8.4* 8.1*  --  7.6* 7.8*  MG 2.5* 2.6*  --   --  2.0  --  2.0  --  2.2  PHOS  --   --    < > 2.5 2.1* 1.9*  --  1.4* 1.6*   < > = values in this interval not displayed.    GFR: Estimated Creatinine Clearance: 79.3 mL/min (by C-G formula based on SCr of 0.77 mg/dL). Recent Labs  Lab 09/26/20 0507 09/27/20 0635 09/28/20 0511 10/29/2020 0244  WBC 6.2 9.3 7.9 8.4     Liver Function Tests: Recent Labs  Lab 09/23/20 0448 09/24/20 0431 09/25/20 0738 09/25/20 1703 09/26/20 0507 09/26/20 1708 09/27/20 0635 09/27/20 1545 09/28/20 1518 10/29/20 0244  AST '30 30 27  '$ --  29  --   --   --   --   --   ALT '19 21 22  '$ --  20  --   --   --   --   --   ALKPHOS 62 56 58  --  50  --   --   --   --   --   BILITOT 1.5* 1.0 1.3*  --  1.0  --   --   --   --   --   PROT 6.9 6.6 6.7  --  5.8*  --   --   --   --   --   ALBUMIN 3.3* 3.2* 3.3*   < > 2.8* 2.7* 2.9* 2.8* 2.7* 2.6*   < > = values in this interval not displayed.    No results for input(s): LIPASE, AMYLASE in the last 168 hours. No results for input(s): AMMONIA in the last 168 hours.  ABG    Component Value Date/Time   PHART 7.42 09/24/2020 1559   PCO2ART 44 09/24/2020 1559   PO2ART 77 (L) 09/24/2020 1559   HCO3 28.5 (H) 09/24/2020 1559   O2SAT 95.5 09/24/2020 1559      Coagulation Profile: No results for input(s): INR, PROTIME in the last 168  hours.   Cardiac Enzymes: No results for input(s): CKTOTAL, CKMB, CKMBINDEX, TROPONINI in the last 168 hours.  HbA1C: Hgb A1c MFr Bld  Date/Time Value Ref Range Status  09/22/2020 05:00 AM 6.6 (H) 4.8 - 5.6 % Final    Comment:    (NOTE)  Prediabetes: 5.7 - 6.4         Diabetes: >6.4         Glycemic control for adults with diabetes: <7.0   12/02/2017 12:14 PM 8.5 (H) 4.8 - 5.6 % Final    Comment:    (NOTE) Pre diabetes:          5.7%-6.4% Diabetes:              >6.4% Glycemic control for   <7.0% adults with diabetes     CBG: Recent Labs  Lab 09/28/20 2119 25-Oct-2020 0801 25-Oct-2020 0831 25-Oct-2020 1111 October 25, 2020 1146  GLUCAP 109* 48* 84 49* 86     Review of Systems:   Unable to assess due to language barrier/CAPR.  Allergies Allergies  Allergen Reactions   Carvedilol Shortness Of Breath    Other reaction(s): Asthma, Shortness of Breath   Penicillins Shortness Of Breath    Other reaction(s): Other (See Comments) Other reaction(s): RASH Other reaction(s): RASH Other reaction(s): RASH    Aspirin     Other reaction(s): Other (See Comments) On eliquis already- high risk of bleeding   Catapres [Clonidine Hcl]     Scheduled Meds:  sodium chloride   Intravenous Once   vitamin C  500 mg Oral Daily   atorvastatin  40 mg Oral q1800   Chlorhexidine Gluconate Cloth  6 each Topical Q0600   cholecalciferol  1,000 Units Oral Daily   donepezil  5 mg Oral QHS   DULoxetine  60 mg Oral Daily   finasteride  5 mg Oral Daily   insulin aspart  0-9 Units Subcutaneous TID WC   Ipratropium-Albuterol  2 puff Inhalation QID   latanoprost  1 drop Both Eyes QHS   levothyroxine  88 mcg Oral Q0600   loratadine  10 mg Oral Daily   midodrine  10 mg Oral TID WC   mometasone-formoterol  2 puff Inhalation BID   multivitamin  1 tablet Oral QHS   omega-3 acid ethyl esters  1 g Oral Daily   pantoprazole  40 mg Oral Daily   sildenafil  20 mg Oral TID   tamsulosin  0.4 mg Oral Daily    tiotropium  18 mcg Inhalation Daily   Vitamin D (Ergocalciferol)  50,000 Units Oral Weekly   zinc sulfate  220 mg Oral Daily   Continuous Infusions:   prismasol BGK 4/2.5 300 mL/hr at 10-25-20 1155    prismasol BGK 4/2.5 300 mL/hr at 10/25/2020 1418   dexmedetomidine (PRECEDEX) IV infusion Stopped (October 25, 2020 1148)   phenylephrine (NEO-SYNEPHRINE) Adult infusion 60 mcg/min (25-Oct-2020 1502)   prismasol BGK 4/2.5 1,500 mL/hr at 10-25-2020 1208   vasopressin Stopped (October 25, 2020 1048)   PRN Meds:.acetaminophen **OR** acetaminophen, chlorpheniramine-HYDROcodone, guaiFENesin-dextromethorphan, heparin, levalbuterol, magnesium hydroxide, melatonin, metoprolol tartrate, ondansetron **OR** ondansetron (ZOFRAN) IV, sodium chloride, traZODone     Ottie Glazier, M.D.  Pulmonary & Critical Care Medicine

## 2020-10-24 DEATH — deceased
